# Patient Record
Sex: Male | Born: 1940 | Race: White | Hispanic: No | Marital: Married | Smoking: Former smoker
Health system: Southern US, Community
[De-identification: ages and names within clinical notes are randomized; demographics above are authoritative.]

## PROBLEM LIST (undated history)

## (undated) DIAGNOSIS — E785 Hyperlipidemia, unspecified: Secondary | ICD-10-CM

## (undated) DIAGNOSIS — T8859XA Other complications of anesthesia, initial encounter: Secondary | ICD-10-CM

## (undated) DIAGNOSIS — K264 Chronic or unspecified duodenal ulcer with hemorrhage: Secondary | ICD-10-CM

## (undated) DIAGNOSIS — D649 Anemia, unspecified: Secondary | ICD-10-CM

## (undated) DIAGNOSIS — K922 Gastrointestinal hemorrhage, unspecified: Secondary | ICD-10-CM

## (undated) DIAGNOSIS — T4145XA Adverse effect of unspecified anesthetic, initial encounter: Secondary | ICD-10-CM

## (undated) DIAGNOSIS — Z85828 Personal history of other malignant neoplasm of skin: Secondary | ICD-10-CM

## (undated) DIAGNOSIS — I10 Essential (primary) hypertension: Secondary | ICD-10-CM

## (undated) DIAGNOSIS — G4730 Sleep apnea, unspecified: Secondary | ICD-10-CM

## (undated) DIAGNOSIS — M869 Osteomyelitis, unspecified: Secondary | ICD-10-CM

## (undated) DIAGNOSIS — C189 Malignant neoplasm of colon, unspecified: Secondary | ICD-10-CM

## (undated) DIAGNOSIS — Z8619 Personal history of other infectious and parasitic diseases: Secondary | ICD-10-CM

## (undated) DIAGNOSIS — Z86018 Personal history of other benign neoplasm: Secondary | ICD-10-CM

## (undated) DIAGNOSIS — Z85038 Personal history of other malignant neoplasm of large intestine: Secondary | ICD-10-CM

## (undated) HISTORY — DX: Gastrointestinal hemorrhage, unspecified: K92.2

## (undated) HISTORY — DX: Essential (primary) hypertension: I10

## (undated) HISTORY — DX: Personal history of other malignant neoplasm of large intestine: Z85.038

## (undated) HISTORY — PX: HEMORRHOID SURGERY: SHX153

## (undated) HISTORY — DX: Personal history of other malignant neoplasm of skin: Z85.828

## (undated) HISTORY — DX: Malignant neoplasm of colon, unspecified: C18.9

## (undated) HISTORY — DX: Personal history of other benign neoplasm: Z86.018

## (undated) HISTORY — DX: Personal history of other infectious and parasitic diseases: Z86.19

## (undated) HISTORY — DX: Anemia, unspecified: D64.9

## (undated) HISTORY — DX: Sleep apnea, unspecified: G47.30

## (undated) HISTORY — DX: Hyperlipidemia, unspecified: E78.5

## (undated) HISTORY — DX: Chronic or unspecified duodenal ulcer with hemorrhage: K26.4

---
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
## 1990-05-02 HISTORY — PX: COLECTOMY: SHX59

## 1998-10-07 ENCOUNTER — Other Ambulatory Visit: Admission: RE | Admit: 1998-10-07 | Discharge: 1998-10-07 | Payer: Self-pay | Admitting: *Deleted

## 1999-07-22 ENCOUNTER — Ambulatory Visit: Admission: RE | Admit: 1999-07-22 | Discharge: 1999-07-22 | Payer: Self-pay | Admitting: Internal Medicine

## 1999-11-09 ENCOUNTER — Encounter (INDEPENDENT_AMBULATORY_CARE_PROVIDER_SITE_OTHER): Payer: Self-pay | Admitting: Specialist

## 1999-11-09 ENCOUNTER — Other Ambulatory Visit: Admission: RE | Admit: 1999-11-09 | Discharge: 1999-11-09 | Payer: Self-pay | Admitting: Gastroenterology

## 2004-06-11 ENCOUNTER — Ambulatory Visit: Payer: Self-pay | Admitting: Internal Medicine

## 2004-06-18 ENCOUNTER — Ambulatory Visit: Payer: Self-pay | Admitting: Internal Medicine

## 2004-06-30 ENCOUNTER — Ambulatory Visit: Payer: Self-pay | Admitting: Internal Medicine

## 2004-09-10 ENCOUNTER — Ambulatory Visit: Payer: Self-pay | Admitting: Internal Medicine

## 2004-10-04 ENCOUNTER — Ambulatory Visit: Payer: Self-pay | Admitting: Internal Medicine

## 2005-03-05 ENCOUNTER — Ambulatory Visit: Payer: Self-pay | Admitting: Internal Medicine

## 2005-04-11 ENCOUNTER — Ambulatory Visit: Payer: Self-pay | Admitting: Internal Medicine

## 2005-04-18 ENCOUNTER — Ambulatory Visit: Payer: Self-pay | Admitting: Internal Medicine

## 2005-06-20 ENCOUNTER — Ambulatory Visit: Payer: Self-pay | Admitting: Internal Medicine

## 2005-09-12 ENCOUNTER — Ambulatory Visit: Payer: Self-pay | Admitting: Internal Medicine

## 2005-09-19 ENCOUNTER — Ambulatory Visit: Payer: Self-pay | Admitting: Internal Medicine

## 2005-10-06 ENCOUNTER — Ambulatory Visit: Payer: Self-pay | Admitting: Gastroenterology

## 2005-10-20 ENCOUNTER — Ambulatory Visit: Payer: Self-pay | Admitting: Gastroenterology

## 2005-10-20 ENCOUNTER — Encounter (INDEPENDENT_AMBULATORY_CARE_PROVIDER_SITE_OTHER): Payer: Self-pay | Admitting: *Deleted

## 2005-11-21 ENCOUNTER — Ambulatory Visit: Payer: Self-pay | Admitting: Internal Medicine

## 2006-01-31 ENCOUNTER — Ambulatory Visit: Payer: Self-pay | Admitting: Internal Medicine

## 2006-05-04 ENCOUNTER — Ambulatory Visit (HOSPITAL_BASED_OUTPATIENT_CLINIC_OR_DEPARTMENT_OTHER): Admission: RE | Admit: 2006-05-04 | Discharge: 2006-05-04 | Payer: Self-pay | Admitting: Internal Medicine

## 2006-05-15 ENCOUNTER — Ambulatory Visit: Payer: Self-pay | Admitting: Internal Medicine

## 2006-05-15 LAB — CONVERTED CEMR LAB: Hgb A1c MFr Bld: 6.2 % — ABNORMAL HIGH (ref 4.6–6.0)

## 2006-05-16 ENCOUNTER — Ambulatory Visit: Payer: Self-pay | Admitting: Pulmonary Disease

## 2006-07-24 ENCOUNTER — Ambulatory Visit: Payer: Self-pay | Admitting: Internal Medicine

## 2006-07-24 LAB — CONVERTED CEMR LAB: Hgb A1c MFr Bld: 6.7 % — ABNORMAL HIGH (ref 4.6–6.0)

## 2006-10-11 ENCOUNTER — Ambulatory Visit: Payer: Self-pay | Admitting: Internal Medicine

## 2006-11-15 ENCOUNTER — Ambulatory Visit: Payer: Self-pay | Admitting: Internal Medicine

## 2007-03-07 ENCOUNTER — Ambulatory Visit: Payer: Self-pay | Admitting: Internal Medicine

## 2007-04-16 ENCOUNTER — Ambulatory Visit: Payer: Self-pay | Admitting: Internal Medicine

## 2007-04-16 LAB — CONVERTED CEMR LAB
Alkaline Phosphatase: 68 units/L (ref 39–117)
Basophils Relative: 0.2 % (ref 0.0–1.0)
Bilirubin Urine: NEGATIVE
CO2: 31 meq/L (ref 19–32)
Cholesterol: 156 mg/dL (ref 0–200)
Creatinine, Ser: 1 mg/dL (ref 0.4–1.5)
Creatinine,U: 145.8 mg/dL
Glucose, Bld: 163 mg/dL — ABNORMAL HIGH (ref 70–99)
HCT: 43.8 % (ref 39.0–52.0)
Hemoglobin: 15.1 g/dL (ref 13.0–17.0)
LDL Cholesterol: 95 mg/dL (ref 0–99)
Microalb, Ur: 0.9 mg/dL (ref 0.0–1.9)
Monocytes Absolute: 0.7 10*3/uL (ref 0.2–0.7)
Neutrophils Relative %: 68.6 % (ref 43.0–77.0)
Nitrite: NEGATIVE
Potassium: 5.2 meq/L — ABNORMAL HIGH (ref 3.5–5.1)
RDW: 13 % (ref 11.5–14.6)
Sodium: 139 meq/L (ref 135–145)
Specific Gravity, Urine: 1.025
TSH: 2.02 microintl units/mL (ref 0.35–5.50)
Total Bilirubin: 0.9 mg/dL (ref 0.3–1.2)
Total Protein: 6.6 g/dL (ref 6.0–8.3)
Urobilinogen, UA: 0.2
VLDL: 26 mg/dL (ref 0–40)

## 2007-04-23 ENCOUNTER — Ambulatory Visit: Payer: Self-pay | Admitting: Internal Medicine

## 2007-04-23 DIAGNOSIS — I10 Essential (primary) hypertension: Secondary | ICD-10-CM

## 2007-04-23 DIAGNOSIS — E1159 Type 2 diabetes mellitus with other circulatory complications: Secondary | ICD-10-CM

## 2007-04-23 DIAGNOSIS — E1169 Type 2 diabetes mellitus with other specified complication: Secondary | ICD-10-CM | POA: Insufficient documentation

## 2007-04-23 DIAGNOSIS — G4733 Obstructive sleep apnea (adult) (pediatric): Secondary | ICD-10-CM | POA: Insufficient documentation

## 2007-04-23 DIAGNOSIS — E119 Type 2 diabetes mellitus without complications: Secondary | ICD-10-CM | POA: Insufficient documentation

## 2007-04-23 DIAGNOSIS — I152 Hypertension secondary to endocrine disorders: Secondary | ICD-10-CM

## 2007-04-23 DIAGNOSIS — E785 Hyperlipidemia, unspecified: Secondary | ICD-10-CM

## 2007-04-23 HISTORY — DX: Essential (primary) hypertension: I10

## 2007-04-23 HISTORY — DX: Obstructive sleep apnea (adult) (pediatric): G47.33

## 2007-04-23 HISTORY — DX: Type 2 diabetes mellitus without complications: E11.9

## 2007-04-23 HISTORY — DX: Type 2 diabetes mellitus with other specified complication: E11.69

## 2007-04-23 HISTORY — DX: Hypertension secondary to endocrine disorders: I15.2

## 2007-04-23 HISTORY — DX: Type 2 diabetes mellitus with other circulatory complications: E11.59

## 2007-07-19 ENCOUNTER — Ambulatory Visit: Payer: Self-pay | Admitting: Internal Medicine

## 2007-10-22 ENCOUNTER — Ambulatory Visit: Payer: Self-pay | Admitting: Internal Medicine

## 2007-10-22 LAB — CONVERTED CEMR LAB: Microalb, Ur: 0.2 mg/dL (ref 0.0–1.9)

## 2008-01-31 ENCOUNTER — Ambulatory Visit: Payer: Self-pay | Admitting: Internal Medicine

## 2008-04-14 ENCOUNTER — Ambulatory Visit: Payer: Self-pay | Admitting: Internal Medicine

## 2008-04-14 LAB — CONVERTED CEMR LAB
ALT: 35 units/L (ref 0–53)
AST: 21 units/L (ref 0–37)
BUN: 10 mg/dL (ref 6–23)
Basophils Relative: 0.2 % (ref 0.0–3.0)
Bilirubin Urine: NEGATIVE
CO2: 31 meq/L (ref 19–32)
Calcium: 9.5 mg/dL (ref 8.4–10.5)
Cholesterol: 149 mg/dL (ref 0–200)
Creatinine, Ser: 0.9 mg/dL (ref 0.4–1.5)
Creatinine,U: 220 mg/dL
Eosinophils Relative: 5.4 % — ABNORMAL HIGH (ref 0.0–5.0)
Glucose, Bld: 143 mg/dL — ABNORMAL HIGH (ref 70–99)
Hemoglobin: 15.3 g/dL (ref 13.0–17.0)
LDL Cholesterol: 88 mg/dL (ref 0–99)
Lymphocytes Relative: 18.2 % (ref 12.0–46.0)
MCHC: 34.8 g/dL (ref 30.0–36.0)
Microalb, Ur: 1 mg/dL (ref 0.0–1.9)
Monocytes Relative: 8.2 % (ref 3.0–12.0)
Neutro Abs: 4.6 10*3/uL (ref 1.4–7.7)
RBC: 4.6 M/uL (ref 4.22–5.81)
TSH: 1.62 microintl units/mL (ref 0.35–5.50)
Total CHOL/HDL Ratio: 3.9
Total Protein: 6.7 g/dL (ref 6.0–8.3)
Urobilinogen, UA: 0.2
WBC: 6.7 10*3/uL (ref 4.5–10.5)

## 2008-04-21 ENCOUNTER — Ambulatory Visit: Payer: Self-pay | Admitting: Internal Medicine

## 2008-04-21 DIAGNOSIS — I498 Other specified cardiac arrhythmias: Secondary | ICD-10-CM | POA: Insufficient documentation

## 2008-05-08 ENCOUNTER — Ambulatory Visit: Payer: Self-pay | Admitting: Internal Medicine

## 2008-05-08 ENCOUNTER — Telehealth: Payer: Self-pay | Admitting: *Deleted

## 2008-05-08 DIAGNOSIS — R519 Headache, unspecified: Secondary | ICD-10-CM | POA: Insufficient documentation

## 2008-05-08 DIAGNOSIS — B0233 Zoster keratitis: Secondary | ICD-10-CM

## 2008-05-08 DIAGNOSIS — B029 Zoster without complications: Secondary | ICD-10-CM | POA: Insufficient documentation

## 2008-05-08 DIAGNOSIS — R51 Headache: Secondary | ICD-10-CM | POA: Insufficient documentation

## 2008-05-08 HISTORY — DX: Zoster keratitis: B02.33

## 2008-05-22 ENCOUNTER — Ambulatory Visit: Payer: Self-pay | Admitting: Internal Medicine

## 2008-05-22 DIAGNOSIS — B0229 Other postherpetic nervous system involvement: Secondary | ICD-10-CM | POA: Insufficient documentation

## 2008-06-02 ENCOUNTER — Ambulatory Visit: Payer: Self-pay | Admitting: Internal Medicine

## 2008-07-09 ENCOUNTER — Ambulatory Visit: Payer: Self-pay | Admitting: Internal Medicine

## 2008-07-09 LAB — CONVERTED CEMR LAB: CRP, High Sensitivity: 1 (ref 0.00–5.00)

## 2008-07-23 ENCOUNTER — Ambulatory Visit: Payer: Self-pay | Admitting: Internal Medicine

## 2008-08-15 ENCOUNTER — Encounter (INDEPENDENT_AMBULATORY_CARE_PROVIDER_SITE_OTHER): Payer: Self-pay | Admitting: *Deleted

## 2008-09-15 ENCOUNTER — Ambulatory Visit: Payer: Self-pay | Admitting: Gastroenterology

## 2008-10-07 ENCOUNTER — Encounter: Payer: Self-pay | Admitting: Gastroenterology

## 2008-10-07 ENCOUNTER — Ambulatory Visit: Payer: Self-pay | Admitting: Gastroenterology

## 2008-10-07 LAB — HM COLONOSCOPY

## 2008-10-08 ENCOUNTER — Encounter: Payer: Self-pay | Admitting: Gastroenterology

## 2008-10-15 ENCOUNTER — Ambulatory Visit: Payer: Self-pay | Admitting: Internal Medicine

## 2008-10-15 LAB — CONVERTED CEMR LAB: Hgb A1c MFr Bld: 7.1 % — ABNORMAL HIGH (ref 4.6–6.5)

## 2008-10-22 ENCOUNTER — Ambulatory Visit: Payer: Self-pay | Admitting: Internal Medicine

## 2008-10-22 LAB — CONVERTED CEMR LAB
Cholesterol, target level: 200 mg/dL
HDL goal, serum: 40 mg/dL

## 2008-12-17 ENCOUNTER — Ambulatory Visit: Payer: Self-pay | Admitting: Internal Medicine

## 2008-12-17 DIAGNOSIS — H209 Unspecified iridocyclitis: Secondary | ICD-10-CM | POA: Insufficient documentation

## 2008-12-17 HISTORY — DX: Unspecified iridocyclitis: H20.9

## 2009-01-29 ENCOUNTER — Ambulatory Visit: Payer: Self-pay | Admitting: Internal Medicine

## 2009-01-29 LAB — CONVERTED CEMR LAB
Calcium: 9.3 mg/dL (ref 8.4–10.5)
Creatinine, Ser: 0.9 mg/dL (ref 0.4–1.5)
Creatinine,U: 165 mg/dL
GFR calc non Af Amer: 89.14 mL/min (ref >60)
Glucose, Bld: 157 mg/dL — ABNORMAL HIGH (ref 70–99)
Sodium: 140 meq/L (ref 135–145)

## 2009-02-17 ENCOUNTER — Ambulatory Visit: Payer: Self-pay | Admitting: Internal Medicine

## 2009-02-19 ENCOUNTER — Encounter: Payer: Self-pay | Admitting: Internal Medicine

## 2009-04-21 ENCOUNTER — Ambulatory Visit: Payer: Self-pay | Admitting: Internal Medicine

## 2009-07-22 ENCOUNTER — Ambulatory Visit: Payer: Self-pay | Admitting: Internal Medicine

## 2009-11-06 ENCOUNTER — Ambulatory Visit: Payer: Self-pay | Admitting: Internal Medicine

## 2009-11-06 LAB — CONVERTED CEMR LAB
ALT: 52 units/L (ref 0–53)
Alkaline Phosphatase: 68 units/L (ref 39–117)
BUN: 20 mg/dL (ref 6–23)
Basophils Relative: 0.5 % (ref 0.0–3.0)
Bilirubin Urine: NEGATIVE
Bilirubin, Direct: 0.1 mg/dL (ref 0.0–0.3)
Calcium: 9.3 mg/dL (ref 8.4–10.5)
Chloride: 106 meq/L (ref 96–112)
Cholesterol: 162 mg/dL (ref 0–200)
Creatinine, Ser: 1 mg/dL (ref 0.4–1.5)
Creatinine,U: 146.6 mg/dL
Eosinophils Relative: 5 % (ref 0.0–5.0)
GFR calc non Af Amer: 81.57 mL/min (ref >60)
HDL: 41.4 mg/dL (ref >39.00)
Hgb A1c MFr Bld: 8.5 % — ABNORMAL HIGH (ref 4.6–6.5)
Ketones, urine, test strip: NEGATIVE
LDL Cholesterol: 84 mg/dL (ref 0–99)
Lymphocytes Relative: 17.4 % (ref 12.0–46.0)
MCV: 94.7 fL (ref 78.0–100.0)
Monocytes Absolute: 0.5 10*3/uL (ref 0.1–1.0)
Monocytes Relative: 7.2 % (ref 3.0–12.0)
Neutrophils Relative %: 69.9 % (ref 43.0–77.0)
Nitrite: NEGATIVE
Platelets: 197 10*3/uL (ref 150.0–400.0)
RBC: 4.68 M/uL (ref 4.22–5.81)
Total Bilirubin: 0.7 mg/dL (ref 0.3–1.2)
Total CHOL/HDL Ratio: 4
Total Protein: 7.4 g/dL (ref 6.0–8.3)
Triglycerides: 182 mg/dL — ABNORMAL HIGH (ref 0.0–149.0)
Urobilinogen, UA: 0.2
VLDL: 36.4 mg/dL (ref 0.0–40.0)
WBC: 7 10*3/uL (ref 4.5–10.5)

## 2009-11-19 ENCOUNTER — Ambulatory Visit: Payer: Self-pay | Admitting: Internal Medicine

## 2009-11-26 ENCOUNTER — Telehealth: Payer: Self-pay | Admitting: Internal Medicine

## 2010-01-27 ENCOUNTER — Ambulatory Visit: Payer: Self-pay | Admitting: Internal Medicine

## 2010-01-27 LAB — CONVERTED CEMR LAB
ALT: 37 units/L (ref 0–53)
AST: 29 units/L (ref 0–37)
Total Bilirubin: 0.6 mg/dL (ref 0.3–1.2)

## 2010-02-08 ENCOUNTER — Ambulatory Visit: Payer: Self-pay | Admitting: Internal Medicine

## 2010-02-08 LAB — HM DIABETES FOOT EXAM

## 2010-02-10 LAB — HM DIABETES EYE EXAM: HM Diabetic Eye Exam: NORMAL

## 2010-06-01 ENCOUNTER — Ambulatory Visit
Admission: RE | Admit: 2010-06-01 | Discharge: 2010-06-01 | Payer: Self-pay | Source: Home / Self Care | Attending: Internal Medicine | Admitting: Internal Medicine

## 2010-06-01 ENCOUNTER — Other Ambulatory Visit: Payer: Self-pay | Admitting: Internal Medicine

## 2010-06-01 LAB — BASIC METABOLIC PANEL
BUN: 18 mg/dL (ref 6–23)
Creatinine, Ser: 1 mg/dL (ref 0.4–1.5)
GFR: 76.85 mL/min (ref >60.00)

## 2010-06-01 LAB — HEMOGLOBIN A1C: Hgb A1c MFr Bld: 6.5 % (ref 4.6–6.5)

## 2010-06-01 NOTE — Assessment & Plan Note (Signed)
Summary: 3 MONTH ROV/NJR   Vital Signs:  Patient profile:   70 year old male Height:      71 inches Weight:      207 pounds BMI:     28.97 Temp:     98.2 degrees F oral Pulse rate:   72 / minute Resp:     14 per minute BP sitting:   136 / 80  (left arm)  Vitals Entered By: Willy Eddy, LPN (February 08, 2010 10:11 AM) CC: roa labs, Type 2 diabetes mellitus follow-up Is Patient Diabetic? Yes Did you bring your meter with you today? No   Primary Care Provider:  Stacie Glaze MD  CC:  roa labs and Type 2 diabetes mellitus follow-up.  History of Present Illness: Has lost weight follow up DM needs new meter   Type 2 Diabetes Mellitus Follow-Up      This is a 70 year old man who presents for Type 2 diabetes mellitus follow-up.  The patient denies polyuria, polydipsia, blurred vision, self managed hypoglycemia, hypoglycemia requiring help, weight loss, weight gain, and numbness of extremities.  The patient denies the following symptoms: neuropathic pain, chest pain, vomiting, orthostatic symptoms, poor wound healing, intermittent claudication, vision loss, and foot ulcer.  Since the last visit the patient reports good dietary compliance.  The patient has been measuring capillary blood glucose before breakfast and before dinner.  Since the last visit, the patient reports having had eye care by an ophthalmologist.    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) 4)  Facial Pain  (ICD-784.0) 5)  Shingles  (ICD-053.9) 6)  Bigeminy  (ICD-427.89) 7)  Sleep Apnea, Obstructive, Moderate  (ICD-327.23) 8)  Family History of Colon Ca 1st Degree Relative <60  (ICD-V16.0) 9)  Hypertension  (ICD-401.9) 10)  Hyperlipidemia  (ICD-272.4) 11)  Diabetes  Mellitus, Type II  (ICD-250.00) 12)  Routine General Medical Exam@health  Care Facl  (ICD-V70.0)  Current Problems (verified): 1)  Iritis  (ICD-364.3) 2)  Postherpetic Neuralgia  (ICD-053.19) 3)  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 1 Three Times A Day 2)  Zetia 10 Mg  Tabs (Ezetimibe) .... Take 1 Tablet By Mouth Once A Day 3)  Azor 5-40 Mg Tabs (Amlodipine-Olmesartan) .... One By Mouth Daily 4)  Janumet 50-1000 Mg Tabs (Sitagliptin-Metformin Hcl) .... One By Mouth Two Times A Day  Current Medications (verified): 1)  Zetia 10 Mg  Tabs (Ezetimibe) .... Take 1 Tablet By Mouth Once A Day 2)  Azor 5-40 Mg Tabs (Amlodipine-Olmesartan) .... One By Mouth Daily 3)  Janumet 50-1000 Mg Tabs (Sitagliptin-Metformin Hcl) .... One By Mouth Two Times A Day 4)  Onetouch Ultra Blue  Strp (Glucose Blood) .... Once Daily  Allergies (verified): No Known Drug Allergies  Past History:  Family  History: Last updated: 04/23/2007 Family History of Colon CA 1st degree relative <60  Social History: Last updated: 04/23/2007 Married Alcohol use-yes Drug use-no Regular exercise-yes  Risk Factors: Exercise: yes (04/23/2007)  Risk Factors: Smoking Status: quit (02/08/2010) Passive Smoke Exposure: no (02/08/2010)  Past medical, surgical, family and social histories (including risk factors) reviewed, and no changes noted (except as noted below).  Past Medical History: Reviewed history from 04/23/2007 and no changes required. Diabetes  mellitus, type II Hyperlipidemia Hypertension  Past Surgical History: Reviewed history from 04/23/2007 and no changes required. Colectomy Hemorrhoidectomy  Family History: Reviewed history from 04/23/2007 and no changes required. Family History of Colon CA 1st degree relative <60  Social History: Reviewed history from 04/23/2007 and no changes required. Married Alcohol use-yes Drug use-no Regular exercise-yes  Review of Systems  The patient denies anorexia, fever, weight loss, weight gain, vision loss, decreased hearing, hoarseness, chest pain, syncope, dyspnea on exertion, peripheral edema, prolonged cough, headaches, hemoptysis, abdominal pain, melena, hematochezia, severe indigestion/heartburn, hematuria, incontinence, genital sores, muscle weakness, suspicious skin lesions, transient blindness, difficulty walking, depression, unusual weight change, abnormal bleeding, enlarged lymph nodes, angioedema, and breast masses.    Physical Exam  General:  Well-developed,well-nourished,in no acute distress; alert,appropriate and cooperative throughout examination Head:  normocephalic and atraumatic.   Eyes:  pupils equal and pupils round.   Ears:  R ear normal and L ear normal.   Nose:  no external deformity and no nasal discharge.   Mouth:  good dentition and pharynx pink and moist.   Neck:  supple and no masses.   Lungs:  normal respiratory effort and no wheezes.   Heart:  normal rate and regular rhythm.   Abdomen:  Bowel sounds positive,abdomen soft and non-tender without masses, organomegaly or hernias noted. Prostate:  no asymmetry.   Msk:  No deformity or scoliosis noted of thoracic or lumbar spine.   Pulses:  R and L carotid,radial,femoral,dorsalis pedis and posterior tibial pulses are full and equal bilaterally Extremities:  No clubbing, cyanosis, edema, or deformity noted with normal full range of motion of all joints.    Diabetes Management Exam:    Foot Exam (with socks  and/or shoes not present):       Sensory-Pinprick/Light touch:          Left medial foot (L-4): normal          Left dorsal foot (L-5): normal          Left lateral foot (S-1): normal          Right medial foot (L-4): normal          Right dorsal foot (L-5): normal          Right lateral foot (S-1): normal       Sensory-Monofilament:          Left foot: normal          Right foot: normal       Inspection:          Left foot: normal          Right foot: normal       Nails:          Left foot: normal          Right foot: normal    Eye Exam:       Eye Exam done elsewhere          Date: 02/10/2010          Results: normal  Done by: Tanner   Impression & Recommendations:  Problem # 1:  HYPERTENSION (ICD-401.9)  home reading ARE 110/70 His updated medication list for this problem includes:    Azor 5-40 Mg Tabs (Amlodipine-olmesartan) ..... One by mouth daily  BP today: 136/80 Prior BP: 136/80 (11/19/2009)  Prior 10 Yr Risk Heart Disease: 18 % (04/21/2009) IF THE DIZZYNESS WORSENS CONSIDER CHANGE TO BENICAR ONLY  Labs Reviewed: K+: 4.8 (11/06/2009) Creat: : 1.0 (11/06/2009)   Chol: 162 (11/06/2009)   HDL: 41.40 (11/06/2009)   LDL: 84 (11/06/2009)   TG: 182.0 (11/06/2009)  BP today: 136/80 Prior BP: 136/80 (11/19/2009)  Prior 10 Yr Risk Heart Disease: 18 % (04/21/2009)  Labs Reviewed: K+: 4.8 (11/06/2009) Creat: : 1.0 (11/06/2009)   Chol: 162 (11/06/2009)   HDL: 41.40 (11/06/2009)   LDL: 84 (11/06/2009)   TG: 182.0 (11/06/2009)  Problem # 2:  HYPERLIPIDEMIA (ICD-272.4)  His updated medication list for this problem includes:    Zetia 10 Mg Tabs (Ezetimibe) .Marland Kitchen... Take 1 tablet by mouth once a day  Labs Reviewed: SGOT: 29 (01/27/2010)   SGPT: 37 (01/27/2010)  Lipid Goals: Chol Goal: 200 (10/22/2008)   HDL Goal: 40 (10/22/2008)   LDL Goal: 100 (10/22/2008)   TG Goal: 150 (10/22/2008)  Prior 10 Yr Risk Heart Disease: 18 % (04/21/2009)   HDL:41.40  (11/06/2009), 38.2 (04/14/2008)  LDL:84 (11/06/2009), 88 (04/14/2008)  Chol:162 (11/06/2009), 149 (04/14/2008)  Trig:182.0 (11/06/2009), 114 (04/14/2008)  Problem # 3:  DIABETES MELLITUS, TYPE II (ICD-250.00) Assessment: Improved  The following medications were removed from the medication list:    Metformin Hcl 500 Mg Tb24 (Metformin hcl) .Marland Kitchen... 1 three times a day His updated medication list for this problem includes:    Azor 5-40 Mg Tabs (Amlodipine-olmesartan) ..... One by mouth daily    Janumet 50-1000 Mg Tabs (Sitagliptin-metformin hcl) ..... One by mouth two times a day  Labs Reviewed: Creat: 1.0 (11/06/2009)     Last Eye Exam: normal (02/10/2010) Reviewed HgBA1c results: 6.5 (01/27/2010)  8.5 (11/06/2009)  Complete Medication List: 1)  Zetia 10 Mg Tabs (Ezetimibe) .... Take 1 tablet by mouth once a day 2)  Azor 5-40 Mg Tabs (Amlodipine-olmesartan) .... One by mouth daily 3)  Janumet 50-1000 Mg Tabs (Sitagliptin-metformin hcl) .... One by mouth two times a day 4)  Onetouch Ultra Blue Strp (Glucose blood) .... Once daily  Patient Instructions: 1)  intructions about randon glucoses 2)  goals are 90-110 for AM fasting 3)  before meals   90-140 range 4)  Please schedule a follow-up appointment in 3 months FOR cpx Prescriptions: ONETOUCH ULTRA BLUE  STRP (GLUCOSE BLOOD) once daily  #100 x 3   Entered by:   Willy Eddy, LPN   Authorized by:   Stacie Glaze MD   Signed by:   Willy Eddy, LPN on 16/01/9603   Method used:   Print then Give to Patient   RxID:   [id number]

## 2010-06-01 NOTE — Assessment & Plan Note (Signed)
Summary: cpx//ccm   Vital Signs:  Patient profile:   70 year old male Height:      71 inches Weight:      222 pounds BMI:     31.07 Temp:     98.2 degrees F oral Pulse rate:   72 / minute Resp:     14 per minute BP sitting:   136 / 80  (left arm)  Vitals Entered By: Willy Eddy, LPN (November 19, 2009 10:36 AM) CC: annual visit for disese managment, Hypertension Management, Lipid Management, Type 2 diabetes mellitus follow-up Is Patient Diabetic? Yes Did you bring your meter with you today? No   Primary Care Provider:  Stacie Glaze MD  CC:  annual visit for disese managment, Hypertension Management, Lipid Management, and Type 2 diabetes mellitus follow-up.  History of Present Illness: The pt was asked about all immunizations, health maint. services that are appropriate to their age and was given guidance on diet exercize  and weight management   wweight gain and diet non compliance    Type 2 Diabetes Mellitus Follow-Up      This is a 70 year old man who presents for Type 2 diabetes mellitus follow-up.  The patient denies polyuria, polydipsia, blurred vision, self managed hypoglycemia, hypoglycemia requiring help, weight loss, weight gain, and numbness of extremities.  The patient denies the following symptoms: neuropathic pain, chest pain, vomiting, orthostatic symptoms, poor wound healing, intermittent claudication, vision loss, and foot ulcer.    Hypertension History:      He denies headache, chest pain, palpitations, dyspnea with exertion, orthopnea, PND, peripheral edema, visual symptoms, neurologic problems, syncope, and side effects from treatment.        Positive major cardiovascular risk factors include male age 70 years old or older, diabetes, hyperlipidemia, and hypertension.  Negative major cardiovascular risk factors include negative family history for ischemic heart disease and non-tobacco-user status.        Further assessment for target organ damage reveals no  history of ASHD, stroke/TIA, or peripheral vascular disease.    Lipid Management History:      Positive NCEP/ATP III risk factors include male age 70 years old or older, diabetes, and hypertension.  Negative NCEP/ATP III risk factors include no family history for ischemic heart disease, non-tobacco-user status, no ASHD (atherosclerotic heart disease), no prior stroke/TIA, no peripheral vascular disease, and no history of aortic aneurysm.      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) 4)  Facial Pain  (ICD-784.0) 5)  Shingles  (ICD-053.9) 6)  Bigeminy  (ICD-427.89) 7)  Sleep Apnea, Obstructive, Moderate  (ICD-327.23) 8)  Family History of Colon Ca 1st Degree Relative <60  (ICD-V16.0) 9)  Hypertension  (ICD-401.9) 10)  Hyperlipidemia  (ICD-272.4) 11)  Diabetes Mellitus, Type II  (ICD-250.00) 12)  Routine General Medical Exam@health  Care Facl  (ICD-V70.0)  Current Problems (verified): 1)  Iritis  (ICD-364.3) 2)  Postherpetic Neuralgia  (ICD-053.19) 3)  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 1 Three Times A Day 2)  Zetia 10 Mg  Tabs (Ezetimibe) .... Take 1 Tablet By Mouth Once A Day 3)  Azor 5-40 Mg Tabs (Amlodipine-Olmesartan) .... One By Mouth Daily  Current Medications  (verified): 1)  Metformin Hcl 500 Mg Tb24 (Metformin Hcl) .Marland Kitchen.. 1 Three Times A Day 2)  Zetia 10 Mg  Tabs (Ezetimibe) .... Take 1 Tablet By Mouth Once A Day 3)  Azor 5-40 Mg Tabs (Amlodipine-Olmesartan) .... One By Mouth Daily 4)  Janumet 50-1000 Mg Tabs (Sitagliptin-Metformin Hcl) .... One By Mouth Two Times A Day  Allergies (verified): No Known Drug Allergies  Past History:  Family History: Last updated: 04/23/2007 Family History of Colon CA 1st degree relative <60  Social History: Last updated: 04/23/2007 Married Alcohol use-yes Drug use-no Regular exercise-yes  Risk Factors: Exercise: yes (04/23/2007)  Risk Factors: Smoking Status: quit (11/19/2009) Passive Smoke Exposure: no (11/19/2009)  Past medical, surgical, family and social histories (including risk factors) reviewed, and no changes noted (except as noted below).  Past Medical History: Reviewed history from 04/23/2007 and no changes required. Diabetes mellitus, type II Hyperlipidemia Hypertension  Past Surgical History: Reviewed history from 04/23/2007 and no changes required. Colectomy Hemorrhoidectomy  Family History: Reviewed history from 04/23/2007 and no changes required. Family History of Colon CA 1st degree relative <60  Social History: Reviewed history from 04/23/2007 and no changes required. Married Alcohol use-yes Drug use-no Regular exercise-yes  Review of Systems  The patient denies anorexia, fever, weight loss, weight gain, vision loss, decreased hearing, hoarseness, chest pain, syncope, dyspnea on exertion, peripheral edema, prolonged cough, headaches, hemoptysis, abdominal pain, melena, hematochezia, severe indigestion/heartburn, hematuria, incontinence, genital sores, muscle weakness, suspicious skin lesions, transient blindness, difficulty walking, depression, unusual weight change, abnormal bleeding, enlarged lymph nodes, angioedema, and breast masses.    Physical Exam  General:   Well-developed,well-nourished,in no acute distress; alert,appropriate and cooperative throughout examination Head:  normocephalic and atraumatic.   Eyes:  pupils equal and pupils round.   Ears:  R ear normal and L ear normal.   Nose:  no external deformity and no nasal discharge.   Mouth:  good dentition and pharynx pink and moist.   Neck:  supple and no masses.   Lungs:  normal respiratory effort and no wheezes.   Heart:  normal rate and regular rhythm.   Abdomen:  Bowel sounds positive,abdomen soft and non-tender without masses, organomegaly or hernias noted. Extremities:  No clubbing, cyanosis, edema, or deformity noted with normal full range of motion of all joints.   Neurologic:  alert & oriented X3 and cranial nerves II-XII intact.     Impression & Recommendations:  Problem # 1:  HYPERTENSION (ICD-401.9)  His updated medication list for this problem includes:    Azor 5-40 Mg Tabs (Amlodipine-olmesartan) ..... One by mouth daily  Orders: EKG w/ Interpretation (93000)  BP today: 136/80 Prior BP: 136/82 (07/22/2009)  Prior 10 Yr Risk Heart Disease: 18 % (04/21/2009)  Labs Reviewed: K+: 4.8 (11/06/2009) Creat: : 1.0 (11/06/2009)   Chol: 162 (11/06/2009)   HDL: 41.40 (11/06/2009)   LDL: 84 (11/06/2009)   TG: 182.0 (11/06/2009)  Problem # 2:  ROUTINE GENERAL MEDICAL EXAM@HEALTH  CARE FACL (ICD-V70.0) The pt was asked about all immunizations, health maint. services that are appropriate to their  age and was given guidance on diet exercize  and weight management  Colonoscopy: Location:  [facility].   (10/07/2008) Td Booster: Historical (04/04/2007)   Flu Vax: Historical (01/30/2009)   Pneumovax: Historical (04/04/2007) Chol: 162 (11/06/2009)   HDL: 41.40 (11/06/2009)   LDL: 84 (11/06/2009)   TG: 182.0 (11/06/2009) TSH: 1.41 (11/06/2009)   HgbA1C: 8.5 (11/06/2009)   PSA: 1.80 (11/06/2009) Next Colonoscopy due:: 10/2011 (10/07/2008)  Discussed using sunscreen, use  of alcohol, drug use, self testicular exam, routine dental care, routine eye care, routine physical exam, seat belts, multiple vitamins, osteoporosis prevention, adequate calcium intake in diet, and recommendations for immunizations.  Discussed exercise and checking cholesterol.  Discussed gun safety, safe sex, and contraception. Also recommend checking PSA.  Problem # 3:  DIABETES MELLITUS, TYPE II (ICD-250.00)  very poor control need medicatns change and major diet revisions His updated medication list for this problem includes:    Metformin Hcl 500 Mg Tb24 (Metformin hcl) .Marland Kitchen... 1 three times a day    Azor 5-40 Mg Tabs (Amlodipine-olmesartan) ..... One by mouth daily    Janumet 50-1000 Mg Tabs (Sitagliptin-metformin hcl) ..... One by mouth two times a day  Labs Reviewed: Creat: 1.0 (11/06/2009)     Last Eye Exam: normal (12/31/2008) Reviewed HgBA1c results: 8.5 (11/06/2009)  7.2 (01/29/2009)  Complete Medication List: 1)  Metformin Hcl 500 Mg Tb24 (Metformin hcl) .Marland Kitchen.. 1 three times a day 2)  Zetia 10 Mg Tabs (Ezetimibe) .... Take 1 tablet by mouth once a day 3)  Azor 5-40 Mg Tabs (Amlodipine-olmesartan) .... One by mouth daily 4)  Janumet 50-1000 Mg Tabs (Sitagliptin-metformin hcl) .... One by mouth two times a day  Hypertension Assessment/Plan:      The patient's hypertensive risk group is category C: Target organ damage and/or diabetes.  His calculated 10 year risk of coronary heart disease is 18 %.  Today's blood pressure is 136/80.  His blood pressure goal is < 130/80.  Lipid Assessment/Plan:      Based on NCEP/ATP III, the patient's risk factor category is "history of diabetes".  The patient's lipid goals are as follows: Total cholesterol goal is 200; LDL cholesterol goal is 100; HDL cholesterol goal is 40; Triglyceride goal is 150.    Patient Instructions: 1)  Please schedule a follow-up appointment in 3 months. 2)  HbgA1C prior to visit, ICD-9:250.00 3)  Hepatic Panel prior  to visit, ICD-9:995.20 Prescriptions: JANUMET 50-1000 MG TABS (SITAGLIPTIN-METFORMIN HCL) one by mouth two times a day  #30 x 1   Entered and Authorized by:   Stacie Glaze MD   Signed by:   Stacie Glaze MD on 11/19/2009   Method used:   Electronically to        [facility]  [phone]* (retail)       [address] [place], [place]  [address]       Ph: [phone] or [phone]       Fax: [phone]   RxID:   [id number]

## 2010-06-01 NOTE — Progress Notes (Signed)
Summary: med questions  Phone Note Call from Patient Call back at Work Phone    Caller: Patient Call For: Stacie Glaze MD Reason for Call: Talk to Doctor Summary of Call: Wife is calling stating he is taking Janumet 1000 mg. one by mouth two times a day glucomannan........Marland Kitchenrequesting to take this with the Janumet??? [phone] Initial call taken by: Lynann Beaver CMA,  November 26, 2009 11:06 AM  Follow-up for Phone Call        per dr Lovell Sheehan- not familiar with med- dont take until pt has ov and can bring in label to analyze it. Follow-up by: Willy Eddy, LPN,  November 26, 2009 11:50 AM

## 2010-06-01 NOTE — Assessment & Plan Note (Signed)
Summary: 3 mo rov/mm   Vital Signs:  Patient profile:   70 year old male Height:      71 inches Weight:      223 pounds BMI:     31.21 Temp:     98.2 degrees F oral Pulse rate:   72 / minute Resp:     14 per minute BP sitting:   136 / 82  (left arm)  Vitals Entered By: Willy Eddy, LPN (July 22, 2009 10:19 AM) CC: roa, Hypertension Management   CC:  roa and Hypertension Management.  History of Present Illness: follow up HTN and monitering HA and DM mild anxiety with OV's   Hypertension History:      He denies headache, chest pain, palpitations, dyspnea with exertion, orthopnea, PND, peripheral edema, visual symptoms, neurologic problems, syncope, and side effects from treatment.        Positive major cardiovascular risk factors include male age 54 years old or older, diabetes, hyperlipidemia, and hypertension.  Negative major cardiovascular risk factors include negative family history for ischemic heart disease and non-tobacco-user status.     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) 4)  Facial Pain  (ICD-784.0) 5)  Shingles  (ICD-053.9) 6)  Bigeminy  (ICD-427.89) 7)  Sleep Apnea, Obstructive, Moderate  (ICD-327.23) 8)  Family History of Colon Ca 1st Degree Relative <60  (ICD-V16.0) 9)  Hypertension  (ICD-401.9) 10)  Hyperlipidemia  (ICD-272.4) 11)  Diabetes Mellitus, Type II  (ICD-250.00) 12)  Routine General Medical Exam@[facility]  (ICD-V70.0)  Current Problems (verified): 1)  Iritis  (ICD-364.3) 2)  Postherpetic Neuralgia  (ICD-053.19) 3)  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 1 Three Times A Day 2)  Zetia 10 Mg  Tabs (Ezetimibe) .... Take 1 Tablet By Mouth Once A Day 3)  Lotemax 0.5 % Susp (Loteprednol Etabonate) .Marland Kitchen.. 1 Gtt  Every Day 4)  Istalol 0.5 % Soln (Timolol Maleate) .Marland Kitchen.. 1 Gtt Once Daily 5)  Azor 5-40 Mg Tabs (Amlodipine-Olmesartan) .... One By Mouth Daily  Current Medications (verified): 1)  Metformin Hcl 500 Mg Tb24 (Metformin Hcl) .Marland Kitchen.. 1 Three Times A Day 2)  Zetia 10 Mg  Tabs (Ezetimibe) .... Take 1 Tablet By Mouth Once A Day 3)  Azor 5-40 Mg Tabs (Amlodipine-Olmesartan) .... One By Mouth Daily  Allergies (verified): No Known Drug Allergies  Past History:  Family History: Last updated: 04/23/2007 Family History of Colon CA 1st degree relative <60  Social History: Last updated: 04/23/2007 Married Alcohol use-yes Drug use-no Regular exercise-yes  Risk Factors: Exercise: yes (04/23/2007)  Risk Factors: Smoking Status: quit (07/22/2009) Passive Smoke Exposure: no (07/22/2009)  Past medical, surgical, family and social histories (including risk factors) reviewed, and no changes noted (except as noted below).  Past Medical History:  Reviewed history from 04/23/2007 and no changes required. Diabetes mellitus, type II Hyperlipidemia Hypertension  Past Surgical History: Reviewed history from 04/23/2007 and no changes required. Colectomy Hemorrhoidectomy  Family History: Reviewed history from 04/23/2007 and no changes required. Family History of Colon CA 1st degree relative <60  Social History: Reviewed history from 04/23/2007 and no changes required. Married Alcohol use-yes Drug  use-no Regular exercise-yes  Review of Systems  The patient denies anorexia, fever, weight loss, weight gain, vision loss, decreased hearing, hoarseness, chest pain, syncope, dyspnea on exertion, peripheral edema, prolonged cough, headaches, hemoptysis, abdominal pain, melena, hematochezia, severe indigestion/heartburn, hematuria, incontinence, genital sores, muscle weakness, suspicious skin lesions, transient blindness, difficulty walking, depression, unusual weight change, abnormal bleeding, enlarged lymph nodes, angioedema, breast masses, and testicular masses.    Physical Exam  General:  Well-developed,well-nourished,in no acute distress; alert,appropriate and cooperative throughout examination Head:  normocephalic and atraumatic.   Eyes:  pupils equal and pupils round.   Ears:  R ear normal and L ear normal.   Nose:  no external deformity and no nasal discharge.   Mouth:  good dentition and pharynx pink and moist.   Neck:  supple and no masses.   Lungs:  normal respiratory effort and no wheezes.   Heart:  normal rate and regular rhythm.   Abdomen:  Bowel sounds positive,abdomen soft and non-tender without masses, organomegaly or hernias noted. Extremities:  No clubbing, cyanosis, edema, or deformity noted with normal full range of motion of all joints.   Neurologic:  alert & oriented X3 and cranial nerves II-XII intact.     Impression & Recommendations:  Problem # 1:  SLEEP APNEA, OBSTRUCTIVE, MODERATE (ICD-327.23) Assessment Unchanged  Problem # 2:  HYPERTENSION (ICD-401.9) Assessment: Unchanged  His updated medication list for this problem includes:    Azor 5-40 Mg Tabs (Amlodipine-olmesartan) ..... One by mouth daily  BP today: 146/86 Prior BP: 132/82 (04/21/2009)  Prior 10 Yr Risk Heart Disease: 18 % (04/21/2009)  Labs Reviewed: K+: 4.3 (01/29/2009) Creat: : 0.9 (01/29/2009)   Chol: 149 (04/14/2008)   HDL: 38.2 (04/14/2008)   LDL: 88 (04/14/2008)   TG: 114  (04/14/2008)  Problem # 3:  DIABETES MELLITUS, TYPE II (ICD-250.00)  His updated medication list for this problem includes:    Metformin Hcl 500 Mg Tb24 (Metformin hcl) .Marland Kitchen... 1 three times a day    Azor 5-40 Mg Tabs (Amlodipine-olmesartan) ..... One by mouth daily  Labs Reviewed: Creat: 0.9 (01/29/2009)     Last Eye Exam: normal (12/31/2008) Reviewed HgBA1c results: 7.2 (01/29/2009)  7.1 (10/15/2008)  Complete Medication List: 1)  Metformin Hcl 500 Mg Tb24 (Metformin hcl) .Marland Kitchen.. 1 three times a day 2)  Zetia 10 Mg Tabs (Ezetimibe) .... Take 1 tablet by mouth once a day 3)  Azor 5-40 Mg Tabs (Amlodipine-olmesartan) .... One by mouth daily  Hypertension Assessment/Plan:      The patient's hypertensive risk group is category C: Target organ damage and/or diabetes.  His calculated 10 year risk of coronary heart disease is 18 %.  Today's blood pressure is 136/82.  His blood pressure goal is < 130/80.  Patient Instructions: 1)  4 months for CPX with labs prior

## 2010-06-09 NOTE — Assessment & Plan Note (Signed)
Summary: 3 month rov/njr   Vital Signs:  Patient profile:   70 year old male Height:      71 inches Weight:      212 pounds BMI:     29.67 Temp:     98.2 degrees F oral Pulse rate:   72 / minute Resp:     14 per minute BP sitting:   132 / 80  (left arm)  Vitals Entered By: Willy Eddy, LPN (June 01, 2010 9:13 AM) CC: roa Is Patient Diabetic? Yes Did you bring your meter with you today? No   Primary Care Provider:  Stacie Glaze MD  CC:  roa.  History of Present Illness:  still remains on steroid therapy for the inflammation of his left eye.  the patient's CBGs are averaging at 112 he feels his blood sugars in good control at this time blood pressure is stable monitoring today will include an A1c and  bmet.  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 Take 1 Tablet By Mouth Once A Day 2)  Azor 5-40 Mg Tabs (Amlodipine-Olmesartan) .... One By Mouth Daily 3)  Janumet 50-1000 Mg Tabs (Sitagliptin-Metformin Hcl) .... One By Mouth Two Times A Day 4)  Onetouch Ultra Blue  Strp (Glucose Blood) .... Once Daily 5)  Alrex 0.2 % Susp (Loteprednol Etabonate) .... I Drop in Left Eye Once  Daily  Allergies (verified): No Known Drug Allergies  Past History:  Family History: Last updated: 04/23/2007 Family History of Colon CA 1st degree relative <60  Social History: Last updated: 04/23/2007 Married Alcohol use-yes Drug use-no Regular exercise-yes  Risk Factors: Exercise: yes (04/23/2007)  Risk Factors: Smoking Status: quit (06/01/2010) Passive Smoke Exposure: no (06/01/2010)  Past medical, surgical, family and social histories (including risk factors) reviewed, and no changes noted (except as noted below).  Past Medical History: Reviewed history from 04/23/2007 and no changes required. Diabetes mellitus, type II Hyperlipidemia Hypertension  Past Surgical History: Reviewed history from 04/23/2007 and no changes required. Colectomy Hemorrhoidectomy  Family History: Reviewed history from 04/23/2007 and no changes required. Family History of Colon CA 1st degree relative <60  Social History: Reviewed history from 04/23/2007 and no changes required. Married Alcohol use-yes Drug use-no Regular exercise-yes  Review of Systems  The patient denies anorexia, fever, weight loss, weight gain, vision loss, decreased hearing, hoarseness, chest pain, syncope, dyspnea on exertion, peripheral edema, prolonged cough, headaches, hemoptysis, abdominal pain, melena, hematochezia, severe indigestion/heartburn, hematuria, incontinence, genital sores, muscle weakness, suspicious skin lesions, transient blindness, difficulty walking, depression, unusual weight change, abnormal bleeding, enlarged lymph nodes, angioedema, and breast masses.    Physical Exam  General:  Well-developed,well-nourished,in no acute distress; alert,appropriate  and cooperative throughout examination Head:  normocephalic and atraumatic.   Eyes:  pupils equal and pupils round.   Ears:  R ear normal and L ear normal.   Nose:  no external deformity and no nasal discharge.   Mouth:  good dentition and  pharynx pink and moist.   Neck:  supple and no masses.   Lungs:  normal respiratory effort and no wheezes.   Heart:  normal rate and regular rhythm.   Abdomen:  Bowel sounds positive,abdomen soft and non-tender without masses, organomegaly or hernias noted. Prostate:  no asymmetry.   Msk:  No deformity or scoliosis noted of thoracic or lumbar spine.   Pulses:  R and L carotid,radial,femoral,dorsalis pedis and posterior tibial pulses are full and equal bilaterally Extremities:  No clubbing, cyanosis, edema, or deformity noted with normal full range of motion of all joints.   Neurologic:  alert & oriented X3 and cranial nerves II-XII intact.     Impression & Recommendations:  Problem # 1:  IRITIS (ICD-364.3) still on steroids  Problem # 2:  HYPERTENSION (ICD-401.9)   pressures excellently stable on the Azor /no peripheral edema has been seen he appears to be tolerating the medication well His updated medication list for this problem includes:    Azor 5-40 Mg Tabs (Amlodipine-olmesartan) ..... One by mouth daily  BP today: 132/80 Prior BP: 136/80 (02/08/2010)  Prior 10 Yr Risk Heart Disease: 18 % (04/21/2009)  Labs Reviewed: K+: 4.8 (11/06/2009) Creat: : 1.0 (11/06/2009)   Chol: 162 (11/06/2009)   HDL: 41.40 (11/06/2009)   LDL: 84 (11/06/2009)   TG: 182.0 (11/06/2009)  Orders: TLB-BMP (Basic Metabolic Panel-BMET) (80048-METABOL)  Problem # 3:  HYPERLIPIDEMIA (ICD-272.4)  His updated medication list for this problem includes:    Zetia 10 Mg Tabs (Ezetimibe) .Marland Kitchen... Take 1 tablet by mouth once a day  Labs Reviewed: SGOT: 29 (01/27/2010)   SGPT: 37 (01/27/2010)  Lipid Goals: Chol Goal: 200 (10/22/2008)   HDL Goal: 40 (10/22/2008)   LDL Goal: 100 (10/22/2008)   TG Goal: 150 (10/22/2008)  Prior 10 Yr Risk Heart Disease: 18 % (04/21/2009)   HDL:41.40 (11/06/2009), 38.2 (04/14/2008)  LDL:84 (11/06/2009), 88 (04/14/2008)  Chol:162 (11/06/2009), 149 (04/14/2008)  Trig:182.0  (11/06/2009), 114 (04/14/2008)  Problem # 4:  DIABETES MELLITUS, TYPE II (ICD-250.00)   A1c will be ordered His updated medication list for this problem includes:    Azor 5-40 Mg Tabs (Amlodipine-olmesartan) ..... One by mouth daily    Janumet 50-1000 Mg Tabs (Sitagliptin-metformin hcl) ..... One by mouth two times a day  Labs Reviewed: Creat: 1.0 (11/06/2009)     Last Eye Exam: normal (02/10/2010) Reviewed HgBA1c results: 6.5 (01/27/2010)  8.5 (11/06/2009)  Orders: TLB-BMP (Basic Metabolic Panel-BMET) (80048-METABOL) Venipuncture (16109) TLB-A1C / Hgb A1C (Glycohemoglobin) (83036-A1C)  Complete Medication List: 1)  Zetia 10 Mg Tabs (Ezetimibe) .... Take 1 tablet by mouth once a day 2)  Azor 5-40 Mg Tabs (Amlodipine-olmesartan) .... One by mouth daily 3)  Janumet 50-1000 Mg Tabs (Sitagliptin-metformin hcl) .... One by mouth two times a day 4)  Onetouch Ultra Blue Strp (Glucose blood) .... Once daily 5)  Alrex 0.2 % Susp (Loteprednol etabonate) .... I drop in left eye once daily  Patient Instructions: 1)  July for CPX   Orders Added: 1)  Est. Patient Level IV [60454] 2)  TLB-BMP (Basic Metabolic Panel-BMET) [80048-METABOL] 3)  Venipuncture [36415] 4)  TLB-A1C / Hgb A1C (Glycohemoglobin) [83036-A1C]  Appended Document: Orders Update     Clinical  Lists Changes  Orders: Added new Service order of Specimen Handling (09811) - Signed

## 2010-06-27 ENCOUNTER — Other Ambulatory Visit: Payer: Self-pay | Admitting: Internal Medicine

## 2010-09-09 ENCOUNTER — Other Ambulatory Visit: Payer: Self-pay | Admitting: Internal Medicine

## 2010-09-17 NOTE — Procedures (Signed)
NAMELARENCE, THONE                   ACCOUNT NO.:  [id number]   MEDICAL RECORD NO.:  [id number]          PATIENT TYPE:  OUT   LOCATION:  SLEEP CENTER                 FACILITY:  [facility]   PHYSICIAN:  Barbaraann Share, MD,FCCPDATE OF BIRTH:  1940/10/09   DATE OF STUDY:  05/04/2006                            NOCTURNAL POLYSOMNOGRAM   REFERRING PHYSICIAN:  Darryll Capers MD   INDICATION FOR STUDY:  Hypersomnia with sleep apnea.   EPWORTH SLEEPINESS SCORE:  8   SLEEP ARCHITECTURE:  The patient had a total sleep time of 342 minutes  with significant quantity of REM but never achieved slow wave sleep.  Sleep onset latency was rapid at 4 minutes and REM onset was normal.  Sleep deficiency was decreased at 84%.   RESPIRATORY DATA:  The patient underwent split night protocol where he  was found to have 108 obstructive events in the first 136 minutes of  sleep.  This gave the patient a respiratory disturbance index of 48  events per hour over that time period.  The events were not positional  but there was very loud snoring noted throughout.  By protocol the  patient was then placed on a medium ComfortGel CPAP mask and titrated to  a final pressure of 7 cm of water with excellent control of his events  even through REM.   OXYGEN DATA:  His O2 saturation is as low as 76% with the patient's  obstructive events.   CARDIAC DATA:  Occasional PVCs noted throughout.   MOVEMENT-PARASOMNIA:  None.   IMPRESSIONS-RECOMMENDATIONS:  1. Split night study revealed severe obstructive sleep apnea/hypopnea      syndrome with a respiratory disturbance index of 48 events per hour      and O2 saturation as low as 76% in the first half of the night.      The patient was then placed on CPAP with a medium ComfortGel mask      and titrated to a final pressure of 7 centimeters with      excellent control.  2. Occasional premature ventricular contractions were noted throughout      the study.      Barbaraann Share, MD,FCCP  Diplomate, American Board of Sleep  Medicine  Electronically Signed     [facility]  D:  05/17/2006 15:54:57  T:  05/18/2006 07:26:37  Job:  [id number]

## 2010-12-03 ENCOUNTER — Encounter: Payer: Self-pay | Admitting: Internal Medicine

## 2010-12-03 ENCOUNTER — Other Ambulatory Visit (INDEPENDENT_AMBULATORY_CARE_PROVIDER_SITE_OTHER): Payer: Medicare Other

## 2010-12-03 DIAGNOSIS — Z0000 Encounter for general adult medical examination without abnormal findings: Secondary | ICD-10-CM

## 2010-12-03 DIAGNOSIS — IMO0002 Reserved for concepts with insufficient information to code with codable children: Secondary | ICD-10-CM

## 2010-12-03 DIAGNOSIS — I10 Essential (primary) hypertension: Secondary | ICD-10-CM

## 2010-12-03 DIAGNOSIS — Z125 Encounter for screening for malignant neoplasm of prostate: Secondary | ICD-10-CM

## 2010-12-03 DIAGNOSIS — E1165 Type 2 diabetes mellitus with hyperglycemia: Secondary | ICD-10-CM

## 2010-12-03 LAB — POCT URINALYSIS DIPSTICK
Blood, UA: NEGATIVE
Glucose, UA: NEGATIVE
Nitrite, UA: NEGATIVE
Urobilinogen, UA: 0.2
pH, UA: 6.5

## 2010-12-03 LAB — HEPATIC FUNCTION PANEL
Alkaline Phosphatase: 66 U/L (ref 39–117)
Bilirubin, Direct: 0.1 mg/dL (ref 0.0–0.3)
Total Protein: 7.1 g/dL (ref 6.0–8.3)

## 2010-12-03 LAB — LIPID PANEL
HDL: 37.6 mg/dL — ABNORMAL LOW (ref >39.00)
LDL Cholesterol: 93 mg/dL (ref 0–99)
Total CHOL/HDL Ratio: 4
Triglycerides: 96 mg/dL (ref 0.0–149.0)

## 2010-12-03 LAB — CBC WITH DIFFERENTIAL/PLATELET
Basophils Relative: 0.3 % (ref 0.0–3.0)
Eosinophils Absolute: 0.3 10*3/uL (ref 0.0–0.7)
Lymphocytes Relative: 17.6 % (ref 12.0–46.0)
MCHC: 33.4 g/dL (ref 30.0–36.0)
Neutrophils Relative %: 69.1 % (ref 43.0–77.0)
RBC: 4.57 Mil/uL (ref 4.22–5.81)
WBC: 6.4 10*3/uL (ref 4.5–10.5)

## 2010-12-03 LAB — BASIC METABOLIC PANEL
CO2: 31 mEq/L (ref 19–32)
Calcium: 9.5 mg/dL (ref 8.4–10.5)
Creatinine, Ser: 1 mg/dL (ref 0.4–1.5)
GFR: 78.51 mL/min (ref >60.00)

## 2010-12-09 ENCOUNTER — Other Ambulatory Visit: Payer: Self-pay | Admitting: *Deleted

## 2010-12-10 ENCOUNTER — Encounter: Payer: Self-pay | Admitting: Internal Medicine

## 2010-12-10 ENCOUNTER — Ambulatory Visit (INDEPENDENT_AMBULATORY_CARE_PROVIDER_SITE_OTHER): Payer: Medicare Other | Admitting: Internal Medicine

## 2010-12-10 VITALS — BP 160/90 | HR 72 | Temp 98.6°F | Resp 16 | Ht 72.0 in | Wt 225.0 lb

## 2010-12-10 DIAGNOSIS — IMO0002 Reserved for concepts with insufficient information to code with codable children: Secondary | ICD-10-CM

## 2010-12-10 DIAGNOSIS — I10 Essential (primary) hypertension: Secondary | ICD-10-CM

## 2010-12-10 DIAGNOSIS — Z0000 Encounter for general adult medical examination without abnormal findings: Secondary | ICD-10-CM

## 2010-12-10 DIAGNOSIS — E1165 Type 2 diabetes mellitus with hyperglycemia: Secondary | ICD-10-CM

## 2010-12-10 DIAGNOSIS — E1169 Type 2 diabetes mellitus with other specified complication: Secondary | ICD-10-CM

## 2010-12-10 NOTE — Progress Notes (Signed)
Subjective:    Patient ID: Walter [name], male    DOB: 07-04-1940, 70 y.o.   MRN: [id number]  Hypertension Pertinent negatives include no neck pain or shortness of breath.  Diabetes Pertinent negatives for diabetes include no fatigue and no weakness.    CPX Has gained weight Blood pressure is poorly controlled CBG's are increased This is due to diet out of control  We reviewed his current diet identified opportunities to reduce sugar and carbohydrate   Review of Systems  Constitutional: Negative for fever and fatigue.  HENT: Negative for hearing loss, congestion, neck pain and postnasal drip.   Eyes: Negative for discharge, redness and visual disturbance.  Respiratory: Negative for cough, shortness of breath and wheezing.   Cardiovascular: Negative for leg swelling.  Gastrointestinal: Negative for abdominal pain, constipation and abdominal distention.  Genitourinary: Negative for urgency and frequency.  Musculoskeletal: Negative for joint swelling and arthralgias.  Skin: Negative for color change and rash.  Neurological: Negative for weakness and light-headedness.  Hematological: Negative for adenopathy.  Psychiatric/Behavioral: Negative for behavioral problems.   Past Medical History  Diagnosis Date  . Hypertension   . Hyperlipidemia   . Diabetes mellitus    Past Surgical History  Procedure Date  . Colectomy   . Hemorrhoid surgery     reports that he has never smoked. He does not have any smokeless tobacco history on file. He reports that he drinks alcohol. He reports that he does not use illicit drugs. family history includes Cancer in an unspecified family member. Allergies not on file     Objective:   Physical Exam  Constitutional: He is oriented to person, place, and time. He appears well-developed and well-nourished.  HENT:  Head: Normocephalic and atraumatic.  Eyes: Conjunctivae are normal. Pupils are equal, round, and reactive to light.  Neck: Normal range of  motion. Neck supple.  Cardiovascular: Normal rate and regular rhythm.   Pulmonary/Chest: Effort normal and breath sounds normal.  Abdominal: Soft. Bowel sounds are normal.  Genitourinary: Prostate normal.  Musculoskeletal: Normal range of motion.  Neurological: He is alert and oriented to person, place, and time.  Skin: Skin is warm and dry.  Psychiatric: He has a normal mood and affect. His behavior is normal.          Assessment & Plan:   Patient presents for yearly preventative medicine examination.   all immunizations and health maintenance protocols were reviewed with the patient and they are up to date with these protocols.   screening laboratory values were reviewed with the patient including screening of hyperlipidemia PSA renal function and hepatic function.   There medications past medical history social history problem list and allergies were reviewed in detail.   Goals were established with regard to weight loss exercise diet in compliance with medication Essentially stable fullness examination with the exception of weight gain which is destabilized 2 issues.  His blood pressure is elevated at 160/90 after recheck it was 150/90 this is secondary to dietary indiscretion increased alcohol increase carbohydrates and significant weight gain.  He does not desire to increase medications at this time and wishes to control his blood pressure and diabetes through weight loss and resumption of a healthy diet.  We set a goal for a 10 pound weight loss he will monitor his blood pressure but does not reduce with limitation of salt that he will require additional blood pressure medications he will monitor his blood sugars will return in 3 months with A1c.

## 2010-12-11 ENCOUNTER — Other Ambulatory Visit: Payer: Self-pay | Admitting: Internal Medicine

## 2011-01-31 ENCOUNTER — Ambulatory Visit (INDEPENDENT_AMBULATORY_CARE_PROVIDER_SITE_OTHER): Payer: Medicare Other

## 2011-01-31 DIAGNOSIS — Z23 Encounter for immunization: Secondary | ICD-10-CM

## 2011-02-08 ENCOUNTER — Other Ambulatory Visit: Payer: Self-pay | Admitting: Internal Medicine

## 2011-04-11 ENCOUNTER — Other Ambulatory Visit (INDEPENDENT_AMBULATORY_CARE_PROVIDER_SITE_OTHER): Payer: Medicare Other

## 2011-04-11 DIAGNOSIS — IMO0002 Reserved for concepts with insufficient information to code with codable children: Secondary | ICD-10-CM

## 2011-04-11 DIAGNOSIS — E1169 Type 2 diabetes mellitus with other specified complication: Secondary | ICD-10-CM

## 2011-04-11 LAB — HEPATIC FUNCTION PANEL
Albumin: 4 g/dL (ref 3.5–5.2)
Alkaline Phosphatase: 59 U/L (ref 39–117)
Bilirubin, Direct: 0.1 mg/dL (ref 0.0–0.3)

## 2011-04-11 LAB — LIPID PANEL: Cholesterol: 128 mg/dL (ref 0–200)

## 2011-04-18 ENCOUNTER — Ambulatory Visit (INDEPENDENT_AMBULATORY_CARE_PROVIDER_SITE_OTHER): Payer: Medicare Other | Admitting: Internal Medicine

## 2011-04-18 ENCOUNTER — Encounter: Payer: Self-pay | Admitting: Internal Medicine

## 2011-04-18 VITALS — BP 155/80 | HR 72 | Temp 98.3°F | Resp 16 | Ht 72.0 in | Wt 222.0 lb

## 2011-04-18 DIAGNOSIS — I10 Essential (primary) hypertension: Secondary | ICD-10-CM

## 2011-04-18 DIAGNOSIS — E785 Hyperlipidemia, unspecified: Secondary | ICD-10-CM

## 2011-04-18 DIAGNOSIS — T887XXA Unspecified adverse effect of drug or medicament, initial encounter: Secondary | ICD-10-CM

## 2011-04-18 DIAGNOSIS — E119 Type 2 diabetes mellitus without complications: Secondary | ICD-10-CM

## 2011-04-18 NOTE — Patient Instructions (Signed)
The patient is instructed to continue all medications as prescribed. Schedule followup with check out clerk upon leaving the clinic  

## 2011-04-18 NOTE — Progress Notes (Signed)
Subjective:    Patient ID: Walter [name], male    DOB: 1941-03-23, 70 y.o.   MRN: [id number]  HPI followup of diabetes hypertension and hyperlipidemia. The patient has been noncompliant with diet he states that he knows that he is doing from a diabetic diet over the past 2 weeks ago he did not take his blood pressure medications today.  He denies any chest pain shortness of breath PND or orthopnea his CBGs at home have been elevated recently    Review of Systems  Constitutional: Negative for fever and fatigue.  HENT: Negative for hearing loss, congestion, neck pain and postnasal drip.   Eyes: Negative for discharge, redness and visual disturbance.  Respiratory: Negative for cough, shortness of breath and wheezing.   Cardiovascular: Negative for leg swelling.  Gastrointestinal: Negative for abdominal pain, constipation and abdominal distention.  Genitourinary: Negative for urgency and frequency.  Musculoskeletal: Negative for joint swelling and arthralgias.  Skin: Negative for color change and rash.  Neurological: Negative for weakness and light-headedness.  Hematological: Negative for adenopathy.  Psychiatric/Behavioral: Negative for behavioral problems.   Past Medical History  Diagnosis Date  . Hypertension   . Hyperlipidemia   . Diabetes mellitus     History   Social History  . Marital Status: Married    Spouse Name: N/A    Number of Children: N/A  . Years of Education: N/A   Occupational History  . Not on file.   Social History Main Topics  . Smoking status: Never Smoker   . Smokeless tobacco: Not on file   Comment: smoked for less that 6 months in 1960  . Alcohol Use: Yes  . Drug Use: No  . Sexually Active: Not on file   Other Topics Concern  . Not on file   Social History Narrative  . No narrative on file    Past Surgical History  Procedure Date  . Colectomy   . Hemorrhoid surgery     Family History  Problem Relation Age of Onset  . Cancer     colon/fhx    Not on File  Current Outpatient Prescriptions on File Prior to Visit  Medication Sig Dispense Refill  . AZOR 5-40 MG per tablet TAKE 1 TABLET BY MOUTH EVERY DAY  30 tablet  4  . glucose blood test strip 1 each by Other route as needed. Use as instructed       . JANUMET 50-1000 MG per tablet TAKE 1 TABLET BY MOUTH TWICE A DAY  60 tablet  6  . ZETIA 10 MG tablet TAKE 1 TABLET BY MOUTH ONCE A DAY  30 tablet  7    BP 155/80  Pulse 72  Temp 98.3 F (36.8 C)  Resp 16  Ht 6' (1.829 m)  Wt 222 lb (100.699 kg)  BMI 30.11 kg/m2        Objective:   Physical Exam  Nursing note and vitals reviewed. Constitutional: He appears well-developed and well-nourished.  HENT:  Head: Normocephalic and atraumatic.  Eyes: Conjunctivae are normal. Pupils are equal, round, and reactive to light.  Neck: Normal range of motion. Neck supple.  Cardiovascular: Normal rate and regular rhythm.   Pulmonary/Chest: Effort normal and breath sounds normal.  Abdominal: Soft. Bowel sounds are normal.          Assessment & Plan:  This is a screening of diabetes hyperlipidemia and hypertension.  His blood pressure is elevated today because he did not take his medications prior to his office  visit we discussed the need not to skip his medications he has a fasting office visit but she should do should take his blood pressure medicines every morning.  His cholesterol is in good control with the exception of triglycerides which were elevated with increased warmth and glucose consumption a vacation and will be probably stress consistency of diet.  his A1c is also elevated again secondary to dietary noncompliance we discussed weight and dietary compliance

## 2011-05-03 ENCOUNTER — Other Ambulatory Visit: Payer: Self-pay | Admitting: Internal Medicine

## 2011-05-10 ENCOUNTER — Other Ambulatory Visit: Payer: Self-pay | Admitting: Internal Medicine

## 2011-05-11 ENCOUNTER — Telehealth: Payer: Self-pay | Admitting: *Deleted

## 2011-05-11 MED ORDER — METFORMIN HCL 1000 MG PO TABS: 1000.0000 mg | ORAL_TABLET | Freq: Two times a day (BID) | ORAL | Status: DC

## 2011-05-11 NOTE — Telephone Encounter (Signed)
Notified pt. 

## 2011-05-11 NOTE — Telephone Encounter (Signed)
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

## 2011-05-11 NOTE — Telephone Encounter (Signed)
Per dr Lovell Sheehan may discontinue janumet and start glucophage 1000mg  twice a day

## 2011-07-27 ENCOUNTER — Encounter: Payer: Self-pay | Admitting: Gastroenterology

## 2011-08-08 ENCOUNTER — Encounter: Payer: Self-pay | Admitting: Gastroenterology

## 2011-08-11 ENCOUNTER — Other Ambulatory Visit (INDEPENDENT_AMBULATORY_CARE_PROVIDER_SITE_OTHER): Payer: Medicare Other

## 2011-08-11 DIAGNOSIS — E119 Type 2 diabetes mellitus without complications: Secondary | ICD-10-CM

## 2011-08-12 LAB — HEMOGLOBIN A1C: Hgb A1c MFr Bld: 8.9 % — ABNORMAL HIGH (ref 4.6–6.5)

## 2011-08-17 ENCOUNTER — Ambulatory Visit (INDEPENDENT_AMBULATORY_CARE_PROVIDER_SITE_OTHER): Payer: Medicare Other | Admitting: Internal Medicine

## 2011-08-17 ENCOUNTER — Encounter: Payer: Self-pay | Admitting: Internal Medicine

## 2011-08-17 VITALS — BP 130/80 | HR 72 | Temp 98.2°F | Resp 16 | Ht 72.0 in | Wt 216.0 lb

## 2011-08-17 DIAGNOSIS — E1169 Type 2 diabetes mellitus with other specified complication: Secondary | ICD-10-CM

## 2011-08-17 DIAGNOSIS — IMO0002 Reserved for concepts with insufficient information to code with codable children: Secondary | ICD-10-CM

## 2011-08-17 DIAGNOSIS — E1165 Type 2 diabetes mellitus with hyperglycemia: Secondary | ICD-10-CM

## 2011-08-17 MED ORDER — SITAGLIPTIN PHOS-METFORMIN HCL 50-1000 MG PO TABS: 1.0000 | ORAL_TABLET | Freq: Two times a day (BID) | ORAL | Status: DC

## 2011-08-17 NOTE — Patient Instructions (Signed)
The patient is instructed to continue all medications as prescribed. Schedule followup with check out clerk upon leaving the clinic  

## 2011-08-17 NOTE — Progress Notes (Signed)
  Subjective:    Patient ID: Walter [name], male    DOB: 1940/10/04, 71 y.o.   MRN: [id number]  HPI  Marked loss of control on the [place] Concern over reported side effects of this drug lead him to discontinuation of the janumet.  Review of Systems  Constitutional: Negative for fever and fatigue.  HENT: Negative for hearing loss, congestion, neck pain and postnasal drip.   Eyes: Negative for discharge, redness and visual disturbance.  Respiratory: Negative for cough, shortness of breath and wheezing.   Cardiovascular: Negative for leg swelling.  Gastrointestinal: Negative for abdominal pain, constipation and abdominal distention.  Genitourinary: Negative for urgency and frequency.  Musculoskeletal: Negative for joint swelling and arthralgias.  Skin: Negative for color change and rash.  Neurological: Negative for weakness and light-headedness.  Hematological: Negative for adenopathy.  Psychiatric/Behavioral: Negative for behavioral problems.   Past Medical History  Diagnosis Date  . Hypertension   . Hyperlipidemia   . Diabetes mellitus     History   Social History  . Marital Status: Married    Spouse Name: N/A    Number of Children: N/A  . Years of Education: N/A   Occupational History  . Not on file.   Social History Main Topics  . Smoking status: Never Smoker   . Smokeless tobacco: Not on file   Comment: smoked for less that 6 months in 1960  . Alcohol Use: Yes  . Drug Use: No  . Sexually Active: Not on file   Other Topics Concern  . Not on file   Social History Narrative  . No narrative on file    Past Surgical History  Procedure Date  . Colectomy   . Hemorrhoid surgery     Family History  Problem Relation Age of Onset  . Cancer      colon/fhx    No Known Allergies  Current Outpatient Prescriptions on File Prior to Visit  Medication Sig Dispense Refill  . AZOR 5-40 MG per tablet TAKE 1 TABLET BY MOUTH EVERY DAY  30 tablet  4  . ONE TOUCH ULTRA  TEST test strip USE ONCE A DAY AS DIRECTED  100 each  1  . ZETIA 10 MG tablet TAKE 1 TABLET BY MOUTH ONCE A DAY  30 tablet  7  . sitaGLIPtan-metformin (JANUMET) 50-1000 MG per tablet Take 1 tablet by mouth 2 (two) times daily with a meal.  60 tablet  11    BP 130/80  Pulse 72  Temp 98.2 F (36.8 C)  Resp 16  Ht 6' (1.829 m)  Wt 216 lb (97.977 kg)  BMI 29.29 kg/m2       Objective:   Physical Exam  Constitutional: He appears well-developed and well-nourished.  HENT:  Head: Normocephalic and atraumatic.  Eyes: Conjunctivae are normal. Pupils are equal, round, and reactive to light.  Neck: Normal range of motion. Neck supple.  Cardiovascular: Normal rate and regular rhythm.   Pulmonary/Chest: Effort normal and breath sounds normal.  Abdominal: Soft. Bowel sounds are normal.          Assessment & Plan:  Patient had discontinued the metformin to 2 some reports yet read and his diabetic control has significantly worsened.  We discussed the safety profile the metformin and the Januvia for him and discussed appropriate monitoring to make sure that he remains stable and his medications.  Reviewed current package insert and warnings and decided together to resume the combination Januvia and metformin drug for diabetic control

## 2011-09-04 ENCOUNTER — Other Ambulatory Visit: Payer: Self-pay | Admitting: Internal Medicine

## 2011-09-22 ENCOUNTER — Encounter: Payer: Self-pay | Admitting: Gastroenterology

## 2011-09-22 ENCOUNTER — Ambulatory Visit (AMBULATORY_SURGERY_CENTER): Payer: Medicare Other | Admitting: *Deleted

## 2011-09-22 VITALS — Ht 72.0 in | Wt 218.1 lb

## 2011-09-22 DIAGNOSIS — Z85038 Personal history of other malignant neoplasm of large intestine: Secondary | ICD-10-CM

## 2011-09-22 DIAGNOSIS — Z1211 Encounter for screening for malignant neoplasm of colon: Secondary | ICD-10-CM

## 2011-09-22 MED ORDER — PEG-KCL-NACL-NASULF-NA ASC-C 100 G PO SOLR: ORAL | Status: DC

## 2011-10-06 ENCOUNTER — Encounter: Payer: Self-pay | Admitting: Gastroenterology

## 2011-10-06 ENCOUNTER — Ambulatory Visit (AMBULATORY_SURGERY_CENTER): Payer: Medicare Other | Admitting: Gastroenterology

## 2011-10-06 VITALS — BP 146/87 | HR 64 | Temp 97.4°F | Resp 16 | Ht 72.0 in | Wt 218.0 lb

## 2011-10-06 DIAGNOSIS — D126 Benign neoplasm of colon, unspecified: Secondary | ICD-10-CM

## 2011-10-06 DIAGNOSIS — Z1211 Encounter for screening for malignant neoplasm of colon: Secondary | ICD-10-CM

## 2011-10-06 DIAGNOSIS — Z85038 Personal history of other malignant neoplasm of large intestine: Secondary | ICD-10-CM

## 2011-10-06 MED ORDER — SODIUM CHLORIDE 0.9 % IV SOLN: 500.0000 mL | INTRAVENOUS | Status: DC

## 2011-10-06 NOTE — Progress Notes (Signed)
Patient did not experience any of the following events: a burn prior to discharge; a fall within the facility; wrong site/side/patient/procedure/implant event; or a hospital transfer or hospital admission upon discharge from the facility. (G8907) Patient did not have preoperative order for IV antibiotic SSI prophylaxis. (G8918)  

## 2011-10-06 NOTE — Op Note (Signed)
[facility] [address] [place], [place]  [address]  COLONOSCOPY PROCEDURE REPORT PATIENT:  Walter [name], Walter [name]  MR#:  [id number] BIRTHDATE:  11-Dec-1940, 70 yrs. old  GENDER:  male ENDOSCOPIST:  Judie Petit T. Russella Dar, MD, [facility]  PROCEDURE DATE:  10/06/2011 PROCEDURE:  Colonoscopy with snare polypectomy ASA CLASS:  Class II INDICATIONS:  1) surveillance and high-risk screening  2) history of colon cancer: 1993 MEDICATIONS:   These medications were titrated to patient response per physician's verbal order, Fentanyl 50 mcg IV, Versed 6 mg IV DESCRIPTION OF PROCEDURE:   After the risks benefits and alternatives of the procedure were thoroughly explained, informed consent was obtained.  Digital rectal exam was performed and revealed no abnormalities.   The LB [id number] endoscope was introduced through the anus and advanced to the cecum, which was identified by both the appendix and ileocecal valve, without limitations.  The quality of the prep was good, using MoviPrep. The instrument was then slowly withdrawn as the colon was fully examined. <<PROCEDUREIMAGES>> FINDINGS:  A sessile polyp was found in the cecum. It was 5 mm in size. Polyp was snared without cautery. Retrieval was successful. A sessile polyp was found at the ileocecal valve. It was 5 mm in size. Polyp was snared without cautery. Retrieval was successful. Mild diverticulosis was found in the descending colon.  There was evidence of a prior segmental colectomy in the sigmoid colon. Otherwise normal colonoscopy without other polyps, masses, vascular ectasias, or inflammatory changes.  Retroflexed views in the rectum revealed internal hemorrhoids, hypertrophied anal papillae. The time to cecum =  2.25  minutes. The scope was then withdrawn (time =  10  min) from the patient and the procedure completed.  COMPLICATIONS:  None  ENDOSCOPIC IMPRESSION: 1) 5 mm sessile polyp in the cecum 2) 5 mm sessile polyp at the  ileocecal valve 3) Mild diverticulosis in the descending colon 4) Prior segmental colectomy in the sigmoid colon 5) Internal hemorrhoids 6) Hypertrophied anal papillae  RECOMMENDATIONS: 1) Await pathology results 2) High fiber diet with liberal fluid intake. 3) Repeat Colonoscopy in 3 years.  Venita Lick. Russella Dar, MD, [facility]  n. eSIGNED:   Venita Lick. Kaliel Bolds at 10/06/2011 10:53 AM  [name], [id number]

## 2011-10-06 NOTE — Patient Instructions (Signed)
Findings:  Polyps, Mild diverticulosis, Internal Hemorrhoids Recommendations:  High Fiber Diet and liberal fluid intake, Repeat colonoscopy in 3 years.  YOU HAD AN ENDOSCOPIC PROCEDURE TODAY AT THE [facility]: Refer to the procedure report that was given to you for any specific questions about what was found during the examination.  If the procedure report does not answer your questions, please call your gastroenterologist to clarify.  If you requested that your care partner not be given the details of your procedure findings, then the procedure report has been included in a sealed envelope for you to review at your convenience later.  YOU SHOULD EXPECT: Some feelings of bloating in the abdomen. Passage of more gas than usual.  Walking can help get rid of the air that was put into your GI tract during the procedure and reduce the bloating. If you had a lower endoscopy (such as a colonoscopy or flexible sigmoidoscopy) you may notice spotting of blood in your stool or on the toilet paper. If you underwent a bowel prep for your procedure, then you may not have a normal bowel movement for a few days.  DIET: Your first meal following the procedure should be a light meal and then it is ok to progress to your normal diet.  A half-sandwich or bowl of soup is an example of a good first meal.  Heavy or fried foods are harder to digest and may make you feel nauseous or bloated.  Likewise meals heavy in dairy and vegetables can cause extra gas to form and this can also increase the bloating.  Drink plenty of fluids but you should avoid alcoholic beverages for 24 hours.  ACTIVITY: Your care partner should take you home directly after the procedure.  You should plan to take it easy, moving slowly for the rest of the day.  You can resume normal activity the day after the procedure however you should NOT DRIVE or use heavy machinery for 24 hours (because of the sedation medicines used during the test).     SYMPTOMS TO REPORT IMMEDIATELY: A gastroenterologist can be reached at any hour.  During normal business hours, 8:30 AM to 5:00 PM Monday through Friday, call [phone].  After hours and on weekends, please call the GI answering service at [phone] who will take a message and have the physician on call contact you.   Following lower endoscopy (colonoscopy or flexible sigmoidoscopy):  Excessive amounts of blood in the stool  Significant tenderness or worsening of abdominal pains  Swelling of the abdomen that is new, acute  Fever of 100F or higher  Following upper endoscopy (EGD)  Vomiting of blood or coffee ground material  New chest pain or pain under the shoulder blades  Painful or persistently difficult swallowing  New shortness of breath  Fever of 100F or higher  Black, tarry-looking stools  FOLLOW UP: If any biopsies were taken you will be contacted by phone or by letter within the next 1-3 weeks.  Call your gastroenterologist if you have not heard about the biopsies in 3 weeks.  Our staff will call the home number listed on your records the next business day following your procedure to check on you and address any questions or concerns that you may have at that time regarding the information given to you following your procedure. This is a courtesy call and so if there is no answer at the home number and we have not heard from you through the emergency physician  on call, we will assume that you have returned to your regular daily activities without incident.  SIGNATURES/CONFIDENTIALITY: You and/or your care partner have signed paperwork which will be entered into your electronic medical record.  These signatures attest to the fact that that the information above on your After Visit Summary has been reviewed and is understood.  Full responsibility of the confidentiality of this discharge information lies with you and/or your care-partner.   Please follow all discharge  instructions given to you by the recovery room nurse. If you have any questions or problems after discharge please call one of the numbers listed above. You will receive a phone call in the am to see how you are doing and answer any questions you may have. Thank you for choosing [facility] for your health care needs.

## 2011-10-07 ENCOUNTER — Telehealth: Payer: Self-pay | Admitting: *Deleted

## 2011-10-07 NOTE — Telephone Encounter (Signed)
   Follow up Call-  Call back number 10/06/2011  Post procedure Call Back phone  # [phone]  Permission to leave phone message Yes     Patient questions:  Do you have a fever, pain , or abdominal swelling? no Pain Score  0 *  Have you tolerated food without any problems? yes  Have you been able to return to your normal activities? yes  Do you have any questions about your discharge instructions: Diet   no Medications  no Follow up visit  no  Do you have questions or concerns about your Care? no  Actions: * If pain score is 4 or above: No action needed, pain <4.

## 2011-10-08 ENCOUNTER — Other Ambulatory Visit: Payer: Self-pay | Admitting: Internal Medicine

## 2011-10-12 ENCOUNTER — Encounter: Payer: Self-pay | Admitting: Gastroenterology

## 2011-11-01 ENCOUNTER — Other Ambulatory Visit (INDEPENDENT_AMBULATORY_CARE_PROVIDER_SITE_OTHER): Payer: Medicare Other

## 2011-11-01 DIAGNOSIS — E1169 Type 2 diabetes mellitus with other specified complication: Secondary | ICD-10-CM

## 2011-11-01 DIAGNOSIS — IMO0002 Reserved for concepts with insufficient information to code with codable children: Secondary | ICD-10-CM

## 2011-11-01 DIAGNOSIS — E1165 Type 2 diabetes mellitus with hyperglycemia: Secondary | ICD-10-CM

## 2011-11-01 LAB — HEPATIC FUNCTION PANEL
AST: 30 U/L (ref 0–37)
Alkaline Phosphatase: 56 U/L (ref 39–117)
Total Bilirubin: 0.8 mg/dL (ref 0.3–1.2)

## 2011-11-01 NOTE — Addendum Note (Signed)
Addended by: Rita Ohara R on: 11/01/2011 08:26 AM   Modules accepted: Orders

## 2011-11-07 ENCOUNTER — Telehealth: Payer: Self-pay | Admitting: Internal Medicine

## 2011-11-07 NOTE — Telephone Encounter (Signed)
Reviewed the results with the patient.  I have mailed another copy of the letter.  He will call back for any additional questions or concerns

## 2011-11-16 ENCOUNTER — Ambulatory Visit (INDEPENDENT_AMBULATORY_CARE_PROVIDER_SITE_OTHER): Payer: Medicare Other | Admitting: Internal Medicine

## 2011-11-16 ENCOUNTER — Encounter: Payer: Self-pay | Admitting: Internal Medicine

## 2011-11-16 VITALS — BP 130/80 | HR 72 | Temp 98.6°F | Resp 16 | Ht 72.0 in | Wt 214.0 lb

## 2011-11-16 DIAGNOSIS — I10 Essential (primary) hypertension: Secondary | ICD-10-CM

## 2011-11-16 DIAGNOSIS — E785 Hyperlipidemia, unspecified: Secondary | ICD-10-CM

## 2011-11-16 DIAGNOSIS — IMO0001 Reserved for inherently not codable concepts without codable children: Secondary | ICD-10-CM

## 2011-11-16 NOTE — Patient Instructions (Signed)
The patient is instructed to continue all medications as prescribed. Schedule followup with check out clerk upon leaving the clinic  

## 2011-11-16 NOTE — Progress Notes (Signed)
Subjective:    Patient ID: Walter [name], male    DOB: 03-13-1941, 71 y.o.   MRN: [id number]  HPI  reviewed diet and exercise and results of CBG's   Review of Systems  Constitutional: Negative for fever and fatigue.  HENT: Negative for hearing loss, congestion, neck pain and postnasal drip.   Eyes: Negative for discharge, redness and visual disturbance.  Respiratory: Negative for cough, shortness of breath and wheezing.   Cardiovascular: Negative for leg swelling.  Gastrointestinal: Negative for abdominal pain, constipation and abdominal distention.  Genitourinary: Negative for urgency and frequency.  Musculoskeletal: Negative for joint swelling and arthralgias.  Skin: Negative for color change and rash.  Neurological: Negative for weakness and light-headedness.  Hematological: Negative for adenopathy.  Psychiatric/Behavioral: Negative for behavioral problems.   Past Medical History  Diagnosis Date  . Hypertension   . Hyperlipidemia   . Diabetes mellitus   . History of shingles   . Colon cancer 1992    colon resection 1992  . Sleep apnea     cpap    History   Social History  . Marital Status: Married    Spouse Name: N/A    Number of Children: N/A  . Years of Education: N/A   Occupational History  . Not on file.   Social History Main Topics  . Smoking status: Never Smoker   . Smokeless tobacco: Never Used   Comment: smoked for less that 6 months in 1960  . Alcohol Use: 1.2 oz/week    2 Cans of beer per week  . Drug Use: No  . Sexually Active: Not on file   Other Topics Concern  . Not on file   Social History Narrative  . No narrative on file    Past Surgical History  Procedure Date  . Colectomy 1992  . Appendectomy 1949    Family History  Problem Relation Age of Onset  . Cancer      colon/fhx  . Colon cancer Father 40  . Rectal cancer Maternal Uncle   . Colon cancer Paternal Grandmother 15    No Known Allergies  Current Outpatient  Prescriptions on File Prior to Visit  Medication Sig Dispense Refill  . AZOR 5-40 MG per tablet TAKE 1 TABLET BY MOUTH EVERY DAY  30 tablet  4  . Loteprednol Etabonate (ALREX OP) Apply to eye daily. 1 drop daily to left eye      . ONE TOUCH ULTRA TEST test strip USE ONCE A DAY AS DIRECTED  100 each  1  . sitaGLIPtan-metformin (JANUMET) 50-1000 MG per tablet Take 1 tablet by mouth 2 (two) times daily with a meal.  60 tablet  11  . ZETIA 10 MG tablet TAKE 1 TABLET BY MOUTH ONCE A DAY  30 tablet  7    BP 130/80  Pulse 72  Temp 98.6 F (37 C)  Resp 16  Ht 6' (1.829 m)  Wt 214 lb (97.07 kg)  BMI 29.02 kg/m2       Objective:   Physical Exam  Nursing note and vitals reviewed. Constitutional: He appears well-developed and well-nourished.  HENT:  Head: Normocephalic and atraumatic.  Eyes: Conjunctivae are normal. Pupils are equal, round, and reactive to light.  Neck: Normal range of motion. Neck supple.  Cardiovascular: Normal rate and regular rhythm.   Pulmonary/Chest: Effort normal and breath sounds normal.  Abdominal: Soft. Bowel sounds are normal.          Assessment & Plan:  monitoring  Glucoses and CBG's have been reduced HTN stable monitering AIC the drop of to 7.6 Weight loss and recent a1c's reflect   Set goal for weight reduction

## 2012-01-03 ENCOUNTER — Other Ambulatory Visit: Payer: Self-pay | Admitting: Internal Medicine

## 2012-01-03 ENCOUNTER — Other Ambulatory Visit: Payer: Self-pay | Admitting: *Deleted

## 2012-01-03 MED ORDER — SITAGLIP PHOS-METFORMIN HCL ER 50-1000 MG PO TB24: 1.0000 | ORAL_TABLET | Freq: Every day | ORAL | Status: DC

## 2012-01-06 ENCOUNTER — Telehealth: Payer: Self-pay | Admitting: Internal Medicine

## 2012-01-06 MED ORDER — SITAGLIP PHOS-METFORMIN HCL ER 50-1000 MG PO TB24: 2.0000 | ORAL_TABLET | Freq: Every day | ORAL | Status: DC

## 2012-01-06 NOTE — Telephone Encounter (Signed)
rx sent in electronically 

## 2012-01-06 NOTE — Telephone Encounter (Signed)
Pt called re: SitaGLIPtin-MetFORMIN HCl (JANUMET XR) 50-1000 MG TB24. Pt said that Dr Lovell Sheehan had increase dosage instructions to 2 per day, but last script that was sent to [facility] on [address] was for 1 per day. Pls correct script to 2 per day and send to pharmacy asap.

## 2012-01-16 ENCOUNTER — Ambulatory Visit (INDEPENDENT_AMBULATORY_CARE_PROVIDER_SITE_OTHER): Payer: Medicare Other

## 2012-01-16 DIAGNOSIS — Z23 Encounter for immunization: Secondary | ICD-10-CM

## 2012-03-05 ENCOUNTER — Other Ambulatory Visit: Payer: Self-pay | Admitting: Internal Medicine

## 2012-03-12 ENCOUNTER — Ambulatory Visit (INDEPENDENT_AMBULATORY_CARE_PROVIDER_SITE_OTHER): Payer: Medicare Other | Admitting: Internal Medicine

## 2012-03-12 ENCOUNTER — Encounter: Payer: Self-pay | Admitting: Internal Medicine

## 2012-03-12 VITALS — BP 135/84 | HR 68 | Temp 98.2°F | Resp 16 | Ht 72.0 in | Wt 218.0 lb

## 2012-03-12 DIAGNOSIS — IMO0001 Reserved for inherently not codable concepts without codable children: Secondary | ICD-10-CM

## 2012-03-12 DIAGNOSIS — I10 Essential (primary) hypertension: Secondary | ICD-10-CM

## 2012-03-12 LAB — BASIC METABOLIC PANEL
CO2: 27 mEq/L (ref 19–32)
Chloride: 102 mEq/L (ref 96–112)
Sodium: 137 mEq/L (ref 135–145)

## 2012-03-12 LAB — HEMOGLOBIN A1C: Hgb A1c MFr Bld: 7.5 % — ABNORMAL HIGH (ref 4.6–6.5)

## 2012-03-12 NOTE — Patient Instructions (Addendum)
The patient is instructed to continue all medications as prescribed. Schedule followup with check out clerk upon leaving the clinic  

## 2012-03-12 NOTE — Progress Notes (Signed)
Subjective:    Patient ID: Walter [name], male    DOB: 1941/03/09, 71 y.o.   MRN: [id number]  HPI HTN  and DM  Follow up Weight gain noted No chest pains Mild early neuropathy?    Review of Systems  Constitutional: Negative for fever and fatigue.  HENT: Negative for hearing loss, congestion, neck pain and postnasal drip.   Eyes: Negative for discharge, redness and visual disturbance.  Respiratory: Negative for cough, shortness of breath and wheezing.   Cardiovascular: Negative for leg swelling.  Gastrointestinal: Negative for abdominal pain, constipation and abdominal distention.  Genitourinary: Negative for urgency and frequency.  Musculoskeletal: Negative for joint swelling and arthralgias.  Skin: Negative for color change and rash.  Neurological: Negative for weakness and light-headedness.  Hematological: Negative for adenopathy.  Psychiatric/Behavioral: Negative for behavioral problems.   Past Medical History  Diagnosis Date  . Hypertension   . Hyperlipidemia   . Diabetes mellitus   . History of shingles   . Colon cancer 1992    colon resection 1992  . Sleep apnea     cpap    History   Social History  . Marital Status: Married    Spouse Name: N/A    Number of Children: N/A  . Years of Education: N/A   Occupational History  . Not on file.   Social History Main Topics  . Smoking status: Never Smoker   . Smokeless tobacco: Never Used     Comment: smoked for less that 6 months in 1960  . Alcohol Use: 1.2 oz/week    2 Cans of beer per week  . Drug Use: No  . Sexually Active: Not on file   Other Topics Concern  . Not on file   Social History Narrative  . No narrative on file    Past Surgical History  Procedure Date  . Colectomy 1992  . Appendectomy 1949    Family History  Problem Relation Age of Onset  . Cancer      colon/fhx  . Colon cancer Father 31  . Rectal cancer Maternal Uncle   . Colon cancer Paternal Grandmother 45    No Known  Allergies  Current Outpatient Prescriptions on File Prior to Visit  Medication Sig Dispense Refill  . AZOR 5-40 MG per tablet TAKE 1 TABLET BY MOUTH EVERY DAY  30 tablet  4  . Loteprednol Etabonate (ALREX OP) Apply to eye daily. 1 drop daily to left eye      . ONE TOUCH ULTRA TEST test strip USE ONCE A DAY AS DIRECTED  100 each  1  . SitaGLIPtin-MetFORMIN HCl (JANUMET XR) 50-1000 MG TB24 Take 2 tablets by mouth daily.  60 tablet  11  . ZETIA 10 MG tablet TAKE 1 TABLET BY MOUTH ONCE A DAY  30 tablet  7    BP 135/84  Pulse 68  Temp 98.2 F (36.8 C)  Resp 16  Ht 6' (1.829 m)  Wt 218 lb (98.884 kg)  BMI 29.57 kg/m2        Objective:   Physical Exam  Nursing note and vitals reviewed. Constitutional: He appears well-developed and well-nourished.  HENT:  Head: Normocephalic and atraumatic.  Eyes: Conjunctivae normal are normal. Pupils are equal, round, and reactive to light.  Neck: Normal range of motion. Neck supple.  Cardiovascular: Normal rate and regular rhythm.   Pulmonary/Chest: Effort normal and breath sounds normal.  Abdominal: Soft. Bowel sounds are normal.  Assessment & Plan:  Probable worsened DM control with weight gain and diet changes due to travel urgered exercise and weight loss goal A1c is still less that 7 Stable blood pressure on azor ( ARB) Lipids to be check next OV

## 2012-05-31 ENCOUNTER — Other Ambulatory Visit: Payer: Self-pay | Admitting: Internal Medicine

## 2012-07-01 ENCOUNTER — Other Ambulatory Visit: Payer: Self-pay | Admitting: Internal Medicine

## 2012-07-30 ENCOUNTER — Other Ambulatory Visit: Payer: Self-pay | Admitting: Internal Medicine

## 2012-09-12 ENCOUNTER — Ambulatory Visit: Payer: Medicare Other | Admitting: Internal Medicine

## 2012-09-13 ENCOUNTER — Ambulatory Visit (INDEPENDENT_AMBULATORY_CARE_PROVIDER_SITE_OTHER): Payer: Medicare Other | Admitting: Internal Medicine

## 2012-09-13 ENCOUNTER — Encounter: Payer: Self-pay | Admitting: Internal Medicine

## 2012-09-13 VITALS — BP 130/80 | HR 72 | Temp 98.3°F | Resp 16 | Ht 72.0 in | Wt 214.0 lb

## 2012-09-13 DIAGNOSIS — I10 Essential (primary) hypertension: Secondary | ICD-10-CM

## 2012-09-13 DIAGNOSIS — IMO0001 Reserved for inherently not codable concepts without codable children: Secondary | ICD-10-CM

## 2012-09-13 LAB — BASIC METABOLIC PANEL
BUN: 18 mg/dL (ref 6–23)
CO2: 27 mEq/L (ref 19–32)
Chloride: 101 mEq/L (ref 96–112)
Glucose, Bld: 252 mg/dL — ABNORMAL HIGH (ref 70–99)
Potassium: 4.4 mEq/L (ref 3.5–5.1)

## 2012-09-13 NOTE — Patient Instructions (Signed)
The patient is instructed to continue all medications as prescribed. Schedule followup with check out clerk upon leaving the clinic  

## 2012-09-13 NOTE — Progress Notes (Signed)
Subjective:    Patient ID: Walter [name], male    DOB: 06-26-1940, 72 y.o.   MRN: [id number]  Hypertension This is a chronic problem. The current episode started more than 1 year ago. The problem is controlled. Pertinent negatives include no anxiety, blurred vision, chest pain, headaches, malaise/fatigue, neck pain, orthopnea, palpitations, peripheral edema, PND, shortness of breath or sweats. Risk factors for coronary artery disease include diabetes mellitus and family history. The current treatment provides significant improvement. There are no compliance problems.  There is no history of chronic renal disease, coarctation of the aorta, hyperparathyroidism, a hypertension causing med or sleep apnea.  Diabetes He presents for his follow-up diabetic visit. He has type 2 diabetes mellitus. His disease course has been improving. Pertinent negatives for hypoglycemia include no headaches or sweats. Pertinent negatives for diabetes include no blurred vision, no chest pain, no fatigue and no weakness. There are no hypoglycemic complications. Symptoms are stable. There are no diabetic complications. Risk factors for coronary artery disease include diabetes mellitus, dyslipidemia and male sex. He has not had a previous visit with a dietician. He participates in exercise three times a week. He does not see a podiatrist.Eye exam is current.      Review of Systems  Constitutional: Negative for fever, malaise/fatigue and fatigue.  HENT: Negative for hearing loss, congestion, neck pain and postnasal drip.   Eyes: Negative for blurred vision, discharge, redness and visual disturbance.  Respiratory: Negative for cough, shortness of breath and wheezing.   Cardiovascular: Negative for chest pain, palpitations, orthopnea, leg swelling and PND.  Gastrointestinal: Negative for abdominal pain, constipation and abdominal distention.  Genitourinary: Negative for urgency and frequency.  Musculoskeletal: Positive for gait  problem. Negative for joint swelling and arthralgias.       Due to bruising  Skin: Positive for color change. Negative for rash.  Neurological: Negative for weakness, light-headedness and headaches.  Hematological: Negative for adenopathy.  Psychiatric/Behavioral: Negative for behavioral problems.   Past Medical History  Diagnosis Date  . Hypertension   . Hyperlipidemia   . Diabetes mellitus   . History of shingles   . Colon cancer 1992    colon resection 1992  . Sleep apnea     cpap    History   Social History  . Marital Status: Married    Spouse Name: N/A    Number of Children: N/A  . Years of Education: N/A   Occupational History  . Not on file.   Social History Main Topics  . Smoking status: Never Smoker   . Smokeless tobacco: Never Used     Comment: smoked for less that 6 months in 1960  . Alcohol Use: 1.2 oz/week    2 Cans of beer per week  . Drug Use: No  . Sexually Active: Not on file   Other Topics Concern  . Not on file   Social History Narrative  . No narrative on file    Past Surgical History  Procedure Laterality Date  . Colectomy  1992  . Appendectomy  1949    Family History  Problem Relation Age of Onset  . Cancer      colon/fhx  . Colon cancer Father 3  . Rectal cancer Maternal Uncle   . Colon cancer Paternal Grandmother 67    No Known Allergies  Current Outpatient Prescriptions on File Prior to Visit  Medication Sig Dispense Refill  . AZOR 5-40 MG per tablet TAKE 1 TABLET BY MOUTH EVERY DAY  30  tablet  4  . Loteprednol Etabonate (ALREX OP) Apply to eye daily. 1 drop daily to left eye      . ONE TOUCH ULTRA TEST test strip USE ONCE DAILY AS DIRECTED  100 each  11  . SitaGLIPtin-MetFORMIN HCl (JANUMET XR) 50-1000 MG TB24 Take 2 tablets by mouth daily.  60 tablet  11  . ZETIA 10 MG tablet TAKE 1 TABLET BY MOUTH ONCE A DAY  30 tablet  7   No current facility-administered medications on file prior to visit.    BP 130/80  Pulse 72   Temp(Src) 98.3 F (36.8 C)  Resp 16  Ht 6' (1.829 m)  Wt 214 lb (97.07 kg)  BMI 29.02 kg/m2       Objective:   Physical Exam  Nursing note and vitals reviewed. Constitutional: He appears well-developed and well-nourished.  HENT:  Head: Normocephalic and atraumatic.  Eyes: Conjunctivae are normal. Pupils are equal, round, and reactive to light.  Cardiovascular: Normal rate and regular rhythm.   Abdominal: Soft. Bowel sounds are normal.  Musculoskeletal: Normal range of motion. He exhibits tenderness.  bruising from fall  Skin: Skin is warm.          Assessment & Plan:  Stable DM  Last a1c was 7.5 and now cbgs are controlled HTN stable and bmet ordered reviewed diet and exercise goals

## 2013-01-01 ENCOUNTER — Other Ambulatory Visit: Payer: Self-pay | Admitting: Internal Medicine

## 2013-01-21 ENCOUNTER — Encounter: Payer: Self-pay | Admitting: Internal Medicine

## 2013-01-21 ENCOUNTER — Ambulatory Visit (INDEPENDENT_AMBULATORY_CARE_PROVIDER_SITE_OTHER): Payer: Medicare Other | Admitting: Internal Medicine

## 2013-01-21 VITALS — BP 140/90 | HR 72 | Temp 98.2°F | Resp 16 | Ht 72.0 in | Wt 212.0 lb

## 2013-01-21 DIAGNOSIS — E785 Hyperlipidemia, unspecified: Secondary | ICD-10-CM

## 2013-01-21 DIAGNOSIS — Z23 Encounter for immunization: Secondary | ICD-10-CM

## 2013-01-21 DIAGNOSIS — I10 Essential (primary) hypertension: Secondary | ICD-10-CM

## 2013-01-21 DIAGNOSIS — E119 Type 2 diabetes mellitus without complications: Secondary | ICD-10-CM

## 2013-01-21 LAB — COMPREHENSIVE METABOLIC PANEL
ALT: 36 U/L (ref 0–53)
AST: 23 U/L (ref 0–37)
Albumin: 4.2 g/dL (ref 3.5–5.2)
Alkaline Phosphatase: 57 U/L (ref 39–117)
BUN: 18 mg/dL (ref 6–23)
Calcium: 9.5 mg/dL (ref 8.4–10.5)
Glucose, Bld: 203 mg/dL — ABNORMAL HIGH (ref 70–99)
Potassium: 4.2 mEq/L (ref 3.5–5.1)
Sodium: 136 mEq/L (ref 135–145)
Total Bilirubin: 0.4 mg/dL (ref 0.3–1.2)

## 2013-01-21 LAB — HEMOGLOBIN A1C: Hgb A1c MFr Bld: 7.7 % — ABNORMAL HIGH (ref 4.6–6.5)

## 2013-01-21 MED ORDER — TELMISARTAN 40 MG PO TABS: 40.0000 mg | ORAL_TABLET | Freq: Every day | ORAL | Status: DC

## 2013-01-21 MED ORDER — AMLODIPINE BESYLATE 5 MG PO TABS: 5.0000 mg | ORAL_TABLET | Freq: Every day | ORAL | Status: DC

## 2013-01-21 MED ORDER — METFORMIN HCL 1000 MG PO TABS: 1000.0000 mg | ORAL_TABLET | Freq: Two times a day (BID) | ORAL | Status: DC

## 2013-01-21 MED ORDER — EZETIMIBE 10 MG PO TABS: ORAL_TABLET | ORAL | Status: DC

## 2013-01-21 NOTE — Progress Notes (Signed)
Subjective:    Patient ID: Walter [name], male    DOB: 12/01/1940, 72 y.o.   MRN: [id number]  HPI Follow up of DM Was on vacation and missed the DM medications Druggist gave him metformin  When he resumed the janumet developed soreness Off the [place] On 2000 of metformin Feels better and has better      Review of Systems  Constitutional: Negative for fever and fatigue.  HENT: Negative for hearing loss, congestion, neck pain and postnasal drip.   Eyes: Negative for discharge, redness and visual disturbance.  Respiratory: Negative for cough, shortness of breath and wheezing.   Cardiovascular: Negative for leg swelling.  Gastrointestinal: Negative for abdominal pain, constipation and abdominal distention.  Genitourinary: Negative for urgency and frequency.  Musculoskeletal: Negative for joint swelling and arthralgias.  Skin: Negative for color change and rash.  Neurological: Negative for weakness and light-headedness.  Hematological: Negative for adenopathy.  Psychiatric/Behavioral: Negative for behavioral problems.   Past Medical History  Diagnosis Date  . Hypertension   . Hyperlipidemia   . Diabetes mellitus   . History of shingles   . Colon cancer 1992    colon resection 1992  . Sleep apnea     cpap    History   Social History  . Marital Status: Married    Spouse Name: N/A    Number of Children: N/A  . Years of Education: N/A   Occupational History  . Not on file.   Social History Main Topics  . Smoking status: Never Smoker   . Smokeless tobacco: Never Used     Comment: smoked for less that 6 months in 1960  . Alcohol Use: 1.2 oz/week    2 Cans of beer per week  . Drug Use: No  . Sexual Activity: Not on file   Other Topics Concern  . Not on file   Social History Narrative  . No narrative on file    Past Surgical History  Procedure Laterality Date  . Colectomy  1992  . Appendectomy  1949    Family History  Problem Relation Age of Onset  . Cancer       colon/fhx  . Colon cancer Father 74  . Rectal cancer Maternal Uncle   . Colon cancer Paternal Grandmother 69    No Known Allergies  Current Outpatient Prescriptions on File Prior to Visit  Medication Sig Dispense Refill  . Loteprednol Etabonate (ALREX OP) Apply to eye daily. 1 drop daily to left eye      . ONE TOUCH ULTRA TEST test strip USE ONCE DAILY AS DIRECTED  100 each  11  . ZETIA 10 MG tablet TAKE 1 TABLET BY MOUTH ONCE A DAY  30 tablet  7   No current facility-administered medications on file prior to visit.    BP 140/90  Pulse 72  Temp(Src) 98.2 F (36.8 C)  Resp 16  Ht 6' (1.829 m)  Wt 212 lb (96.163 kg)  BMI 28.75 kg/m2       Objective:   Physical Exam  Constitutional: He appears well-developed and well-nourished.  HENT:  Head: Normocephalic and atraumatic.  Eyes: Conjunctivae are normal. Pupils are equal, round, and reactive to light.  Neck: Normal range of motion. Neck supple.  Cardiovascular: Normal rate and regular rhythm.   Pulmonary/Chest: Effort normal and breath sounds normal.  Abdominal: Soft. Bowel sounds are normal.          Assessment & Plan:  Patient developed myalgias with the Januvia  and we discontinued the medications on metformin alone his blood sugars have remained in good control in the myalgias have ceased.  If I think it is reasonable to discontinue the Januvia at this point continue the metformin 1000 mg twice daily measure hemoglobin A1c today and upper for that class of drugs if it is going to cause myalgias if he needs additional control of his diabetes we will consider going outside the class.  For blood pressure control due to cost we are going to get a generic amlodipine and generic Micardis which should create a effective

## 2013-01-21 NOTE — Addendum Note (Signed)
Addended by: Stacie Glaze on: 01/21/2013 10:18 AM   Modules accepted: Orders

## 2013-01-21 NOTE — Patient Instructions (Signed)
The patient is instructed to continue all medications as prescribed. Schedule followup with check out clerk upon leaving the clinic  

## 2013-03-13 ENCOUNTER — Encounter: Payer: Self-pay | Admitting: Family Medicine

## 2013-03-13 ENCOUNTER — Ambulatory Visit (INDEPENDENT_AMBULATORY_CARE_PROVIDER_SITE_OTHER): Payer: Medicare Other | Admitting: Family Medicine

## 2013-03-13 VITALS — BP 140/80 | HR 99 | Temp 98.3°F | Wt 206.0 lb

## 2013-03-13 DIAGNOSIS — J40 Bronchitis, not specified as acute or chronic: Secondary | ICD-10-CM

## 2013-03-13 MED ORDER — HYDROCODONE-HOMATROPINE 5-1.5 MG/5ML PO SYRP: 5.0000 mL | ORAL_SOLUTION | ORAL | Status: DC | PRN

## 2013-03-13 MED ORDER — AMOXICILLIN-POT CLAVULANATE 875-125 MG PO TABS: 1.0000 | ORAL_TABLET | Freq: Two times a day (BID) | ORAL | Status: DC

## 2013-03-13 MED ORDER — CEFTRIAXONE SODIUM 1 G IJ SOLR
1.0000 g | Freq: Once | INTRAMUSCULAR | Status: AC
  Administered 2013-03-13: 1 g via INTRAMUSCULAR

## 2013-03-13 NOTE — Progress Notes (Signed)
Pre visit review using our clinic review tool, if applicable. No additional management support is needed unless otherwise documented below in the visit note. 

## 2013-03-13 NOTE — Addendum Note (Signed)
Addended by: Aniceto Boss A on: 03/13/2013 12:27 PM   Modules accepted: Orders

## 2013-03-13 NOTE — Progress Notes (Signed)
  Subjective:    Patient ID: Emmitte Surgeon, male    DOB: Mar 06, 1941, 72 y.o.   MRN: [id number]  HPI Here for one week of chest tightness, SOB, and coughing up green sputum. No fever. Using Robitussin.    Review of Systems  Constitutional: Negative.   HENT: Negative.   Eyes: Negative.   Respiratory: Positive for cough, chest tightness, shortness of breath and wheezing.   Cardiovascular: Negative.        Objective:   Physical Exam  Constitutional: He appears well-developed and well-nourished.  HENT:  Right Ear: External ear normal.  Left Ear: External ear normal.  Nose: Nose normal.  Mouth/Throat: Oropharynx is clear and moist.  Eyes: Conjunctivae are normal.  Pulmonary/Chest: Effort normal. No respiratory distress. He has no rales.  Scattered rhonchi and wheezes   Lymphadenopathy:    He has no cervical adenopathy.          Assessment & Plan:  This is bronchitis which is bordering on pneumonia. Given Rocephin to be followed by Augmentin.

## 2013-08-05 ENCOUNTER — Other Ambulatory Visit (INDEPENDENT_AMBULATORY_CARE_PROVIDER_SITE_OTHER): Payer: Medicare Other

## 2013-08-05 DIAGNOSIS — E785 Hyperlipidemia, unspecified: Secondary | ICD-10-CM | POA: Diagnosis not present

## 2013-08-05 DIAGNOSIS — Z125 Encounter for screening for malignant neoplasm of prostate: Secondary | ICD-10-CM

## 2013-08-05 DIAGNOSIS — E119 Type 2 diabetes mellitus without complications: Secondary | ICD-10-CM | POA: Diagnosis not present

## 2013-08-05 DIAGNOSIS — I10 Essential (primary) hypertension: Secondary | ICD-10-CM | POA: Diagnosis not present

## 2013-08-05 DIAGNOSIS — Z0000 Encounter for general adult medical examination without abnormal findings: Secondary | ICD-10-CM

## 2013-08-05 LAB — MICROALBUMIN / CREATININE URINE RATIO
CREATININE, U: 217.5 mg/dL
MICROALB/CREAT RATIO: 1.6 mg/g (ref 0.0–30.0)
Microalb, Ur: 3.5 mg/dL — ABNORMAL HIGH (ref 0.0–1.9)

## 2013-08-05 LAB — BASIC METABOLIC PANEL
BUN: 13 mg/dL (ref 6–23)
CALCIUM: 9.3 mg/dL (ref 8.4–10.5)
CHLORIDE: 100 meq/L (ref 96–112)
CO2: 29 mEq/L (ref 19–32)
CREATININE: 1 mg/dL (ref 0.4–1.5)
GFR: 80.7 mL/min (ref >60.00)
Glucose, Bld: 256 mg/dL — ABNORMAL HIGH (ref 70–99)
Potassium: 5 mEq/L (ref 3.5–5.1)
Sodium: 139 mEq/L (ref 135–145)

## 2013-08-05 LAB — POCT URINALYSIS DIPSTICK
BILIRUBIN UA: NEGATIVE
Leukocytes, UA: NEGATIVE
NITRITE UA: NEGATIVE
RBC UA: NEGATIVE
Spec Grav, UA: >=1.03
Urobilinogen, UA: 0.2
pH, UA: 5

## 2013-08-05 LAB — HEMOGLOBIN A1C: Hgb A1c MFr Bld: 9.9 % — ABNORMAL HIGH (ref 4.6–6.5)

## 2013-08-05 LAB — LIPID PANEL
Cholesterol: 138 mg/dL (ref 0–200)
HDL: 40.7 mg/dL (ref >39.00)
LDL Cholesterol: 66 mg/dL (ref 0–99)
TRIGLYCERIDES: 156 mg/dL — AB (ref 0.0–149.0)
Total CHOL/HDL Ratio: 3
VLDL: 31.2 mg/dL (ref 0.0–40.0)

## 2013-08-05 LAB — CBC WITH DIFFERENTIAL/PLATELET
BASOS PCT: 0.5 % (ref 0.0–3.0)
Basophils Absolute: 0 10*3/uL (ref 0.0–0.1)
Eosinophils Absolute: 0.2 10*3/uL (ref 0.0–0.7)
Eosinophils Relative: 3.5 % (ref 0.0–5.0)
HCT: 41.7 % (ref 39.0–52.0)
Hemoglobin: 14.3 g/dL (ref 13.0–17.0)
LYMPHS PCT: 20.9 % (ref 12.0–46.0)
Lymphs Abs: 1.2 10*3/uL (ref 0.7–4.0)
MCHC: 34.3 g/dL (ref 30.0–36.0)
MCV: 92.1 fl (ref 78.0–100.0)
MONOS PCT: 8.5 % (ref 3.0–12.0)
Monocytes Absolute: 0.5 10*3/uL (ref 0.1–1.0)
NEUTROS PCT: 66.6 % (ref 43.0–77.0)
Neutro Abs: 3.9 10*3/uL (ref 1.4–7.7)
PLATELETS: 85 10*3/uL — AB (ref 150.0–400.0)
RBC: 4.53 Mil/uL (ref 4.22–5.81)
RDW: 13.4 % (ref 11.5–14.6)
WBC: 5.8 10*3/uL (ref 4.5–10.5)

## 2013-08-05 LAB — HEPATIC FUNCTION PANEL
ALK PHOS: 71 U/L (ref 39–117)
ALT: 58 U/L — AB (ref 0–53)
AST: 31 U/L (ref 0–37)
Albumin: 4 g/dL (ref 3.5–5.2)
BILIRUBIN TOTAL: 0.7 mg/dL (ref 0.3–1.2)
Bilirubin, Direct: 0.1 mg/dL (ref 0.0–0.3)
TOTAL PROTEIN: 6.7 g/dL (ref 6.0–8.3)

## 2013-08-05 LAB — TSH: TSH: 2.28 u[IU]/mL (ref 0.35–5.50)

## 2013-08-05 LAB — PSA: PSA: 2.6 ng/mL (ref 0.10–4.00)

## 2013-08-12 ENCOUNTER — Other Ambulatory Visit: Payer: Medicare Other

## 2013-08-19 ENCOUNTER — Ambulatory Visit (INDEPENDENT_AMBULATORY_CARE_PROVIDER_SITE_OTHER): Payer: Medicare Other | Admitting: Internal Medicine

## 2013-08-19 ENCOUNTER — Encounter: Payer: Self-pay | Admitting: Internal Medicine

## 2013-08-19 VITALS — BP 138/80 | HR 96 | Temp 97.9°F | Ht 72.0 in | Wt 214.0 lb

## 2013-08-19 DIAGNOSIS — N476 Balanoposthitis: Secondary | ICD-10-CM

## 2013-08-19 DIAGNOSIS — E1165 Type 2 diabetes mellitus with hyperglycemia: Secondary | ICD-10-CM

## 2013-08-19 DIAGNOSIS — Z23 Encounter for immunization: Secondary | ICD-10-CM

## 2013-08-19 DIAGNOSIS — N481 Balanitis: Secondary | ICD-10-CM

## 2013-08-19 DIAGNOSIS — Z0000 Encounter for general adult medical examination without abnormal findings: Secondary | ICD-10-CM

## 2013-08-19 DIAGNOSIS — IMO0002 Reserved for concepts with insufficient information to code with codable children: Secondary | ICD-10-CM

## 2013-08-19 DIAGNOSIS — E1169 Type 2 diabetes mellitus with other specified complication: Secondary | ICD-10-CM

## 2013-08-19 MED ORDER — FLUCONAZOLE 100 MG PO TABS: 100.0000 mg | ORAL_TABLET | Freq: Every day | ORAL | Status: DC

## 2013-08-19 MED ORDER — ALBIGLUTIDE 30 MG ~~LOC~~ PEN: 1.0000 "application " | PEN_INJECTOR | SUBCUTANEOUS | Status: DC

## 2013-08-19 MED ORDER — PNEUMOCOCCAL 13-VAL CONJ VACC IM SUSP: 0.5000 mL | INTRAMUSCULAR | Status: DC

## 2013-08-19 NOTE — Addendum Note (Signed)
Addended by: Monico Blitz T on: 08/19/2013 10:53 AM   Modules accepted: Orders

## 2013-08-19 NOTE — Progress Notes (Signed)
Subjective:    Patient ID: Walter [name], male    DOB: [birth year], 73 y.o.   MRN: [id number]  HPI Had a fall April 13 with significant hip bruising on the right Needs the prevnar Had severe bronchitis and was treated with prednisone and antibiotics   Review of Systems  Constitutional: Negative for fever and fatigue.  HENT: Negative for congestion, hearing loss and postnasal drip.   Eyes: Negative for discharge, redness and visual disturbance.  Respiratory: Negative for cough, shortness of breath and wheezing.   Cardiovascular: Negative for leg swelling.  Gastrointestinal: Negative for abdominal pain, constipation and abdominal distention.  Genitourinary: Negative for urgency and frequency.  Musculoskeletal: Negative for arthralgias, joint swelling and neck pain.  Skin: Negative for color change and rash.  Neurological: Negative for weakness and light-headedness.  Hematological: Negative for adenopathy.  Psychiatric/Behavioral: Negative for behavioral problems.   Past Medical History  Diagnosis Date  . Hypertension   . Hyperlipidemia   . Diabetes mellitus   . History of shingles   . Colon cancer 1992    colon resection 1992  . Sleep apnea     cpap    History   Social History  . Marital Status: Married    Spouse Name: N/A    Number of Children: N/A  . Years of Education: N/A   Occupational History  . Not on file.   Social History Main Topics  . Smoking status: Never Smoker   . Smokeless tobacco: Never Used     Comment: smoked for less that 6 months in 1960  . Alcohol Use: 1.2 oz/week    2 Cans of beer per week  . Drug Use: No  . Sexual Activity: Not on file   Other Topics Concern  . Not on file   Social History Narrative  . No narrative on file    Past Surgical History  Procedure Laterality Date  . Colectomy  1992  . Appendectomy  1949    Family History  Problem Relation Age of Onset  . Cancer      colon/fhx  . Colon cancer Father 85  . Rectal  cancer Maternal Uncle   . Colon cancer Paternal Grandmother 65    No Known Allergies  Current Outpatient Prescriptions on File Prior to Visit  Medication Sig Dispense Refill  . amLODipine (NORVASC) 5 MG tablet Take 1 tablet (5 mg total) by mouth daily.  90 tablet  3  . ezetimibe (ZETIA) 10 MG tablet TAKE 1 TABLET BY MOUTH ONCE A DAY  30 tablet  7  . Loteprednol Etabonate (ALREX OP) Apply to eye daily. 1 drop daily to left eye      . metFORMIN (GLUCOPHAGE) 1000 MG tablet Take 1 tablet (1,000 mg total) by mouth 2 (two) times daily with a meal.  180 tablet  3  . ONE TOUCH ULTRA TEST test strip USE ONCE DAILY AS DIRECTED  100 each  11  . telmisartan (MICARDIS) 40 MG tablet Take 1 tablet (40 mg total) by mouth daily.  90 tablet  3  . HYDROcodone-homatropine (HYDROMET) 5-1.5 MG/5ML syrup Take 5 mLs by mouth every 4 (four) hours as needed for cough.  240 mL  0   No current facility-administered medications on file prior to visit.    BP 138/80  Pulse 96  Temp(Src) 97.9 F (36.6 C) (Oral)  Ht 6' (1.829 m)  Wt 214 lb (97.07 kg)  BMI 29.02 kg/m2  SpO2 97%  Objective:   Physical Exam  Constitutional: He is oriented to person, place, and time. He appears well-developed and well-nourished.  HENT:  Head: Normocephalic and atraumatic.  Eyes: Conjunctivae are normal. Pupils are equal, round, and reactive to light.  Neck: Normal range of motion. Neck supple.  Cardiovascular: Normal rate and regular rhythm.   Pulmonary/Chest: Effort normal and breath sounds normal.  Abdominal: Soft. Bowel sounds are normal.  Musculoskeletal: Normal range of motion. He exhibits edema.  Neurological: He is alert and oriented to person, place, and time.  Skin: Skin is warm and dry.  Psychiatric: He has a normal mood and affect. His behavior is normal.          Assessment & Plan:  Patient presents for yearly preventative medicine examination. Medicare questionnaire was completed  All  immunizations and health maintenance protocols were reviewed with the patient and needed orders were placed.  Appropriate screening laboratory values were ordered for the patient including screening of hyperlipidemia, renal function and hepatic function. If indicated by BPH, a PSA was ordered.  Medication reconciliation,  past medical history, social history, problem list and allergies were reviewed in detail with the patient  Had been without metformin for 4 weeks  Resumed in march    Goals were established with regard to weight loss, exercise, and  diet in compliance with medications  End of life planning was discussed.  faithfull use of Cpap Stable problems

## 2013-08-19 NOTE — Progress Notes (Signed)
Pre visit review using our clinic review tool, if applicable. No additional management support is needed unless otherwise documented below in the visit note. 

## 2013-08-19 NOTE — Patient Instructions (Signed)
Start the injectable once a week

## 2013-08-19 NOTE — Addendum Note (Signed)
Addended by: Ricard Dillon on: 08/19/2013 09:42 AM   Modules accepted: Orders

## 2013-08-21 ENCOUNTER — Telehealth: Payer: Self-pay | Admitting: Internal Medicine

## 2013-08-21 NOTE — Telephone Encounter (Signed)
I received a denial for Tanzeum from [facility].  Pt must try and fail Byetta and Victoza or there are specific medical reason why the alternative medication(s) are not appropriate to treat the medical condition.

## 2013-08-21 NOTE — Telephone Encounter (Signed)
Pt following up on request for this med, pt states he has never tried anything else, so, whatever the doc thinks is ok w/ him!

## 2013-08-22 NOTE — Telephone Encounter (Signed)
He has enough for a month Did he try the card at his pharmacy locally for a year free

## 2013-08-23 ENCOUNTER — Telehealth: Payer: Self-pay | Admitting: Internal Medicine

## 2013-08-23 MED ORDER — LIRAGLUTIDE 18 MG/3ML ~~LOC~~ SOPN: PEN_INJECTOR | SUBCUTANEOUS | Status: DC

## 2013-08-23 NOTE — Telephone Encounter (Signed)
See previous note:  Per Dr. Arnoldo Morale pt was given is of no value; per pt the card states he does not qualify for this.  Pls advise on changes.

## 2013-08-23 NOTE — Telephone Encounter (Signed)
Pt returned your call would like a call back.  

## 2013-08-23 NOTE — Telephone Encounter (Signed)
Called and spoke with pt and pt is aware.  

## 2013-08-23 NOTE — Telephone Encounter (Signed)
Called and spoke with pt's wife and she will see if her husband has the card to take to the local pharmacy. Advised if she could not find the card to call back and I would see if I could locate another one.  Pt does have enough for 1 month.

## 2013-08-23 NOTE — Telephone Encounter (Signed)
Can change to victoza but the shot is daily  1.2 mg

## 2013-08-28 ENCOUNTER — Telehealth: Payer: Self-pay

## 2013-08-28 NOTE — Telephone Encounter (Signed)
Relevant patient education assigned to patient using Emmi. ° °

## 2013-10-21 ENCOUNTER — Other Ambulatory Visit (INDEPENDENT_AMBULATORY_CARE_PROVIDER_SITE_OTHER): Payer: Medicare Other

## 2013-10-21 DIAGNOSIS — E1169 Type 2 diabetes mellitus with other specified complication: Principal | ICD-10-CM

## 2013-10-21 DIAGNOSIS — IMO0002 Reserved for concepts with insufficient information to code with codable children: Secondary | ICD-10-CM

## 2013-10-21 DIAGNOSIS — E1165 Type 2 diabetes mellitus with hyperglycemia: Secondary | ICD-10-CM

## 2013-10-21 LAB — HEMOGLOBIN A1C: HEMOGLOBIN A1C: 7.4 % — AB (ref 4.6–6.5)

## 2013-10-25 ENCOUNTER — Other Ambulatory Visit: Payer: Self-pay | Admitting: Internal Medicine

## 2013-10-28 ENCOUNTER — Ambulatory Visit (INDEPENDENT_AMBULATORY_CARE_PROVIDER_SITE_OTHER): Payer: Medicare Other | Admitting: Physician Assistant

## 2013-10-28 ENCOUNTER — Telehealth: Payer: Self-pay

## 2013-10-28 ENCOUNTER — Encounter: Payer: Self-pay | Admitting: Physician Assistant

## 2013-10-28 VITALS — BP 110/68 | HR 84 | Temp 97.7°F | Resp 18 | Wt 202.0 lb

## 2013-10-28 DIAGNOSIS — E1169 Type 2 diabetes mellitus with other specified complication: Principal | ICD-10-CM

## 2013-10-28 DIAGNOSIS — E1165 Type 2 diabetes mellitus with hyperglycemia: Secondary | ICD-10-CM

## 2013-10-28 DIAGNOSIS — IMO0002 Reserved for concepts with insufficient information to code with codable children: Secondary | ICD-10-CM

## 2013-10-28 MED ORDER — GLUCOSE BLOOD VI STRP: ORAL_STRIP | Status: DC

## 2013-10-28 MED ORDER — ONETOUCH FINEPOINT LANCETS MISC: Status: DC

## 2013-10-28 NOTE — Patient Instructions (Signed)
We will schedule and A1c recheck for about 3 months from now.  After that lab draw, you will have an appointment scheduled with Dr. Yong Channel, so that he can reevaluate the necessity of new diabetes medications.  In the meantime, continue to diet and exercise and see if you're A1c continues to drop.  Follow up in about 3 months with new PCP to reassess, or sooner as needed.    Diabetes Mellitus and Food It is important for you to manage your blood sugar (glucose) level. Your blood glucose level can be greatly affected by what you eat. Eating healthier foods in the appropriate amounts throughout the day at about the same time each day will help you control your blood glucose level. It can also help slow or prevent worsening of your diabetes mellitus. Healthy eating may even help you improve the level of your blood pressure and reach or maintain a healthy weight.  HOW CAN FOOD AFFECT ME? Carbohydrates Carbohydrates affect your blood glucose level more than any other type of food. Your dietitian will help you determine how many carbohydrates to eat at each meal and teach you how to count carbohydrates. Counting carbohydrates is important to keep your blood glucose at a healthy level, especially if you are using insulin or taking certain medicines for diabetes mellitus. Alcohol Alcohol can cause sudden decreases in blood glucose (hypoglycemia), especially if you use insulin or take certain medicines for diabetes mellitus. Hypoglycemia can be a life-threatening condition. Symptoms of hypoglycemia (sleepiness, dizziness, and disorientation) are similar to symptoms of having too much alcohol.  If your health care Walter [name] has given you approval to drink alcohol, do so in moderation and use the following guidelines:  Women should not have more than one drink per day, and men should not have more than two drinks per day. One drink is equal to:  12 oz of beer.  5 oz of wine.  1 oz of hard liquor.  Do  not drink on an empty stomach.  Keep yourself hydrated. Have water, diet soda, or unsweetened iced tea.  Regular soda, juice, and other mixers might contain a lot of carbohydrates and should be counted. WHAT FOODS ARE NOT RECOMMENDED? As you make food choices, it is important to remember that all foods are not the same. Some foods have fewer nutrients per serving than other foods, even though they might have the same number of calories or carbohydrates. It is difficult to get your body what it needs when you eat foods with fewer nutrients. Examples of foods that you should avoid that are high in calories and carbohydrates but low in nutrients include:  Trans fats (most processed foods list trans fats on the Nutrition Facts label).  Regular soda.  Juice.  Candy.  Sweets, such as cake, pie, doughnuts, and cookies.  Fried foods. WHAT FOODS CAN I EAT? Have nutrient-rich foods, which will nourish your body and keep you healthy. The food you should eat also will depend on several factors, including:  The calories you need.  The medicines you take.  Your weight.  Your blood glucose level.  Your blood pressure level.  Your cholesterol level. You also should eat a variety of foods, including:  Protein, such as meat, poultry, fish, tofu, nuts, and seeds (lean animal proteins are best).  Fruits.  Vegetables.  Dairy products, such as milk, cheese, and yogurt (low fat is best).  Breads, grains, pasta, cereal, rice, and beans.  Fats such as olive oil, trans fat-free margarine,  canola oil, avocado, and olives. DOES EVERYONE WITH DIABETES MELLITUS HAVE THE SAME MEAL PLAN? Because every person with diabetes mellitus is different, there is not one meal plan that works for everyone. It is very important that you meet with a dietitian who will help you create a meal plan that is just right for you. Document Released: 01/13/2005 Document Revised: 04/23/2013 Document Reviewed:  03/15/2013 [facility] Patient Information 2015 [place], [place]. This information is not intended to replace advice given to you by your health care Walter [name]. Make sure you discuss any questions you have with your health care Walter [name].

## 2013-10-28 NOTE — Progress Notes (Signed)
Pre visit review using our clinic review tool, if applicable. No additional management support is needed unless otherwise documented below in the visit note. 

## 2013-10-28 NOTE — Telephone Encounter (Signed)
Error//acm

## 2013-10-28 NOTE — Progress Notes (Signed)
Subjective:    Patient ID: Walter [name], male    DOB: [birth year], 73 y.o.   MRN: [id number]  HPI Patient is 73 year old Caucasian male presenting to the clinic for followup of diabetes type 2. Patient was seen 9 weeks ago by primary care provider, and hemoglobin A1c was 9.9%. Patient was prescribed Victoza, and repeat hemoglobin A1c was scheduled. Patient presents today having had recent hemoglobin A1c 7.4% one week ago. Patient states that he has not started Victoza, as he wished to try diet and exercise modification on his own prior to this. He states that he has lost approximately 14 pounds over the past 9 weeks and has been eating a bland diet, limiting sugary foods, and exercising more. He wishes if possible to avoid starting Victoza at this time and to continue with diet and exercise. He is also currently taking metformin daily, he denies adverse effects to this medication. He is currently asymptomatic, and denies fevers, chills, nausea, vomiting, diarrhea, shortness of breath.   Review of Systems As per history of present illness and otherwise negative.   Past Medical History  Diagnosis Date  . Hypertension   . Hyperlipidemia   . Diabetes mellitus   . History of shingles   . Colon cancer 1992    colon resection 1992  . Sleep apnea     cpap    History   Social History  . Marital Status: Married    Spouse Name: N/A    Number of Children: N/A  . Years of Education: N/A   Occupational History  . Not on file.   Social History Main Topics  . Smoking status: Never Smoker   . Smokeless tobacco: Never Used     Comment: smoked for less that 6 months in 1960  . Alcohol Use: 1.2 oz/week    2 Cans of beer per week  . Drug Use: No  . Sexual Activity: Not on file   Other Topics Concern  . Not on file   Social History Narrative  . No narrative on file    Past Surgical History  Procedure Laterality Date  . Colectomy  1992  . Appendectomy  1949    Family History  Problem  Relation Age of Onset  . Cancer      colon/fhx  . Colon cancer Father 51  . Rectal cancer Maternal Uncle   . Colon cancer Paternal Grandmother 72    No Known Allergies  Current Outpatient Prescriptions on File Prior to Visit  Medication Sig Dispense Refill  . amLODipine (NORVASC) 5 MG tablet Take 1 tablet (5 mg total) by mouth daily.  90 tablet  3  . ezetimibe (ZETIA) 10 MG tablet TAKE 1 TABLET BY MOUTH ONCE A DAY  30 tablet  7  . Loteprednol Etabonate (ALREX OP) Apply to eye daily. 1 drop daily to left eye      . metFORMIN (GLUCOPHAGE) 1000 MG tablet Take 1 tablet (1,000 mg total) by mouth 2 (two) times daily with a meal.  180 tablet  3  . telmisartan (MICARDIS) 40 MG tablet Take 1 tablet (40 mg total) by mouth daily.  90 tablet  3  . Liraglutide (VICTOZA) 18 MG/3ML SOPN 1.2 mg daily.  15 mL  3   Current Facility-Administered Medications on File Prior to Visit  Medication Dose Route Frequency Provider Last Rate Last Dose  . pneumococcal 13-valent conjugate vaccine (PREVNAR 13) injection 0.5 mL  0.5 mL Intramuscular Tomorrow-1000 Ricard Dillon, MD  EXAM: BP 110/68  Pulse 84  Temp(Src) 97.7 F (36.5 C) (Oral)  Resp 18  Wt 202 lb (91.627 kg)     Objective:   Physical Exam  Nursing note and vitals reviewed. Constitutional: He is oriented to person, place, and time. He appears well-developed and well-nourished. No distress.  HENT:  Head: Normocephalic and atraumatic.  Eyes: Conjunctivae and EOM are normal. Pupils are equal, round, and reactive to light.  Neck: Normal range of motion.  Cardiovascular: Normal rate, regular rhythm and intact distal pulses.   Pulmonary/Chest: Effort normal and breath sounds normal. No respiratory distress. He exhibits no tenderness.  Musculoskeletal: Normal range of motion.  Neurological: He is alert and oriented to person, place, and time.  Skin: Skin is warm and dry. No rash noted. He is not diaphoretic. No erythema. No pallor.    Psychiatric: He has a normal mood and affect. His behavior is normal. Judgment and thought content normal.    Lab Results  Component Value Date   WBC 5.8 08/05/2013   HGB 14.3 08/05/2013   HCT 41.7 08/05/2013   PLT 85.0* 08/05/2013   GLUCOSE 256* 08/05/2013   CHOL 138 08/05/2013   TRIG 156.0* 08/05/2013   HDL 40.70 08/05/2013   LDLCALC 66 08/05/2013   ALT 58* 08/05/2013   AST 31 08/05/2013   NA 139 08/05/2013   K 5.0 08/05/2013   CL 100 08/05/2013   CREATININE 1.0 08/05/2013   BUN 13 08/05/2013   CO2 29 08/05/2013   TSH 2.28 08/05/2013   PSA 2.60 08/05/2013   HGBA1C 7.4* 10/21/2013   MICROALBUR 3.5* 08/05/2013       Assessment & Plan:  Walter [name] was seen today for follow-up.  Diagnoses and associated orders for this visit:  Type II or unspecified type diabetes mellitus with other specified manifestations, uncontrolled Comments: A1c down in just 9 weeks from 9.9 to 7.4 with diet and exercise alone. Pt wishes to continue without victoza for now. Will reassess in 3 months. - Hemoglobin A1c; Future  Other Orders - LIFESCAN FINEPOINT LANCETS MISC; Use as directed. - glucose blood (ONETOUCH VERIO) test strip; Test once daily.    Pt will establish with Dr. Yong Channel later this Summer. He is on the books to schedule an appointment when available.  Return precautions provided, and patient handout on Diabetes and food.  Plan to follow up in about 3 months to reassess, and otherwise as needed.  Patient Instructions  We will schedule and A1c recheck for about 3 months from now.  After that lab draw, you will have an appointment scheduled with Dr. Yong Channel, so that he can reevaluate the necessity of new diabetes medications.  In the meantime, continue to diet and exercise and see if you're A1c continues to drop.  Follow up in about 3 months with new PCP to reassess, or sooner as needed.

## 2014-01-07 ENCOUNTER — Other Ambulatory Visit: Payer: Self-pay | Admitting: Internal Medicine

## 2014-01-16 ENCOUNTER — Other Ambulatory Visit (INDEPENDENT_AMBULATORY_CARE_PROVIDER_SITE_OTHER): Payer: Medicare Other

## 2014-01-16 DIAGNOSIS — E1165 Type 2 diabetes mellitus with hyperglycemia: Secondary | ICD-10-CM

## 2014-01-16 DIAGNOSIS — IMO0002 Reserved for concepts with insufficient information to code with codable children: Secondary | ICD-10-CM

## 2014-01-16 DIAGNOSIS — E1169 Type 2 diabetes mellitus with other specified complication: Principal | ICD-10-CM

## 2014-01-16 LAB — HEMOGLOBIN A1C: Hgb A1c MFr Bld: 7.1 % — ABNORMAL HIGH (ref 4.6–6.5)

## 2014-01-20 ENCOUNTER — Other Ambulatory Visit: Payer: Medicare Other

## 2014-01-27 ENCOUNTER — Encounter: Payer: Self-pay | Admitting: Family Medicine

## 2014-01-27 ENCOUNTER — Ambulatory Visit (INDEPENDENT_AMBULATORY_CARE_PROVIDER_SITE_OTHER): Payer: Medicare Other | Admitting: Family Medicine

## 2014-01-27 VITALS — BP 122/70 | HR 80 | Temp 98.2°F | Wt 199.0 lb

## 2014-01-27 DIAGNOSIS — I10 Essential (primary) hypertension: Secondary | ICD-10-CM

## 2014-01-27 DIAGNOSIS — Z23 Encounter for immunization: Secondary | ICD-10-CM

## 2014-01-27 DIAGNOSIS — E119 Type 2 diabetes mellitus without complications: Secondary | ICD-10-CM

## 2014-01-27 DIAGNOSIS — E785 Hyperlipidemia, unspecified: Secondary | ICD-10-CM

## 2014-01-27 DIAGNOSIS — Z85038 Personal history of other malignant neoplasm of large intestine: Secondary | ICD-10-CM | POA: Insufficient documentation

## 2014-01-27 MED ORDER — ATORVASTATIN CALCIUM 20 MG PO TABS: ORAL_TABLET | ORAL | Status: DC

## 2014-01-27 NOTE — Assessment & Plan Note (Signed)
Well controlled on metformin alone at 2g. Patient recently cut to 1.5g daily. F/u in 3 months with repeat a1c. A1c to be checked before visit.

## 2014-01-27 NOTE — Assessment & Plan Note (Signed)
Well controlled on amlodipine 5mg  and telmisartan. Check cmet next visit

## 2014-01-27 NOTE — Assessment & Plan Note (Signed)
No mortality data from zetia. Change to atorvastatin 20mg  once a week. Check lipids 3 months.

## 2014-01-27 NOTE — Progress Notes (Signed)
[name], MD Phone: [phone]  Subjective:  Patient presents today to establish care with me as their new primary care provider. Patient was formerly a patient of Dr. Arnoldo Morale. Chief complaint-noted.   DIABETES Type II-reasonable control goal <7.5  Lab Results  Component Value Date   HGBA1C 7.1* 01/16/2014   HGBA1C 7.4* 10/21/2013   HGBA1C 9.9* 08/05/2013  Medications taking and tolerating-yes, metformin 1500mg  Blood Sugars per patient-fasting- 115-130 with occasional spikes Diet-raw oatmeal for breakfast, can of black beans at least once a day for lunch or dinner Regular Exercise-6 miles a day walking, up to 10 miles On Aspirin-yes On statin-no zetia Daily foot monitoring-yes Health Maintenance Due  Topic Date Due  . Foot Exam  02/09/2011  ROS- Denies Polyuria,Polydipsia, nocturia, Vision changes, feet or hand numbness/pain/tingling. Denies  Hypoglycemia symptoms (shaky, sweaty, hungry, weak anxious, tremor, palpitations, confusion, behavior change).   Hyperlipidemia-controlled on zetia but no mortality data  Lab Results  Component Value Date   LDLCALC 66 08/05/2013  On statin: no, on zetia Regular exercise: yes as above Diet: eating very well ROS- no chest pain or shortness of breath. No myalgias  Hypertension-well controlled  BP Readings from Last 3 Encounters:  01/27/14 122/70  10/28/13 110/68  08/19/13 138/80  Home BP monitoring-no Compliant with medications-yes without side effects ROS-Denies any CP, HA, SOB, blurry vision, LE edema.  The following were reviewed and entered/updated in epic: Past Medical History  Diagnosis Date  . Hypertension   . Hyperlipidemia   . Diabetes mellitus   . History of shingles   . Colon cancer 1992    colon resection 1992  . Sleep apnea     cpap   Patient Active Problem List   Diagnosis Date Noted  . DIABETES MELLITUS, TYPE II 04/23/2007    Priority: High  . BIGEMINY 04/21/2008    Priority: Medium  . HYPERLIPIDEMIA  04/23/2007    Priority: Medium  . SLEEP APNEA, OBSTRUCTIVE, MODERATE 04/23/2007    Priority: Medium  . HYPERTENSION 04/23/2007    Priority: Medium  . IRITIS 12/17/2008    Priority: Low  . 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 Colectomy  1992  . Appendectomy  1949    Family History  Problem Relation Age of Onset  . Cancer      colon/fhx  . Colon cancer Father 56  . Rectal cancer Maternal Uncle   . Colon cancer Paternal Grandmother 39    Medications- reviewed and updated Current Outpatient Prescriptions  Medication Sig Dispense Refill  . amLODipine (NORVASC) 5 MG tablet Take 1 tablet (5 mg total) by mouth daily.  90 tablet  3  . glucose blood (ONETOUCH VERIO) test strip Test once daily.  100 each  12  . LIFESCAN FINEPOINT LANCETS MISC Use as directed.  100 each  5  . metFORMIN (GLUCOPHAGE) 1000 MG tablet Take 1 tablet (1,000 mg total) by mouth 2 (two) times daily with a meal.  180 tablet  3  . telmisartan (MICARDIS) 40 MG tablet Take 1 tablet (40 mg total) by mouth daily.  90 tablet  3  . ZETIA 10 MG tablet TAKE 1 TABLET BY MOUTH ONCE DAILY  30 tablet  0  . Loteprednol Etabonate (ALREX OP) Apply to eye daily. 1 drop daily to left eye       No current facility-administered medications for this  visit.   Facility-Administered Medications Ordered in Other Visits  Medication Dose Route Frequency Provider Last Rate Last Dose  . pneumococcal 13-valent conjugate vaccine (PREVNAR 13) injection 0.5 mL  0.5 mL Intramuscular Tomorrow-1000 Ricard Dillon, MD        Allergies-reviewed and updated No Known Allergies  History   Social History  . Marital Status: Married    Spouse Name: N/A    Number of Children: N/A  . Years of Education: N/A   Social History Main Topics  . Smoking status: Never Smoker   . Smokeless tobacco: Never Used      Comment: smoked for less that 6 months in 1960  . Alcohol Use: 1.2 oz/week    2 Cans of beer per week  . Drug Use: No  . Sexual Activity: None   Other Topics Concern  . None   Social History Narrative  . None    ROS--See HPI   Objective: BP 122/70  Pulse 80  Temp(Src) 98.2 F (36.8 C)  Wt 199 lb (90.266 kg) Gen: NAD, resting comfortably, moves to table with ease HEENT: Mucous membranes are moist.  CV: RRR no murmurs rubs or gallops Lungs: CTAB no crackles, wheeze, rhonchi Abdomen: soft/nontender/nondistended/normal bowel sounds.   Ext: no edema Skin: warm, dry, no rash Neuro: grossly normal, moves all extremities, PERRLA DM foot exam normal   Assessment/Plan:  DIABETES MELLITUS, TYPE II Well controlled on metformin alone at 2g. Patient recently cut to 1.5g daily. F/u in 3 months with repeat a1c. A1c to be checked before visit.   HYPERLIPIDEMIA No mortality data from zetia. Change to atorvastatin 20mg  once a week. Check lipids 3 months.   HYPERTENSION Well controlled on amlodipine 5mg  and telmisartan. Check cmet next visit   Future fasting labs for 3 months Orders Placed This Encounter  Procedures  . Hemoglobin A1c    [place]    Standing Status: Future     Number of Occurrences:      Standing Expiration Date: 06/29/2014  . Lipid panel    [place]    Standing Status: Future     Number of Occurrences:      Standing Expiration Date: 06/29/2014    Order Specific Question:  Has the patient fasted?    Answer:  No  . TSH    [place]    Standing Status: Future     Number of Occurrences:      Standing Expiration Date: 06/29/2014  . Comprehensive metabolic panel    [place]    Standing Status: Future     Number of Occurrences:      Standing Expiration Date: 06/29/2014    Order Specific Question:  Has the patient fasted?    Answer:  No  . CBC    [place]    Standing Status: Future     Number of Occurrences:      Standing Expiration Date: 06/29/2014    Meds  ordered this encounter  Medications  . aspirin 81 MG tablet    Sig: Take 81 mg by mouth daily.  Marland Kitchen atorvastatin (LIPITOR) 20 MG tablet    Sig: Take 1 pill by mouth once per week    Dispense:  30 tablet    Refill:  3

## 2014-01-27 NOTE — Patient Instructions (Signed)
Very impressed by your hard work on diabetes. A1c of 7.1. Continue metformin 1500mg  and since this was a recent change let's check your a1c in 3 months.   For cholesterol, take atorvastatin once a week instead of zetia daily.   Blood pressure looks great

## 2014-01-30 ENCOUNTER — Other Ambulatory Visit: Payer: Self-pay | Admitting: Internal Medicine

## 2014-02-03 LAB — HM DIABETES EYE EXAM

## 2014-02-06 ENCOUNTER — Telehealth: Payer: Self-pay | Admitting: Family Medicine

## 2014-02-06 DIAGNOSIS — E118 Type 2 diabetes mellitus with unspecified complications: Secondary | ICD-10-CM

## 2014-02-06 NOTE — Telephone Encounter (Signed)
[id number] - [place], [place] - [address] [place]. AT [address] is requesting re-fill on metFORMIN (GLUCOPHAGE) 1000 MG tablet

## 2014-02-07 MED ORDER — METFORMIN HCL 1000 MG PO TABS: 1000.0000 mg | ORAL_TABLET | Freq: Two times a day (BID) | ORAL | Status: DC

## 2014-02-07 NOTE — Telephone Encounter (Signed)
Medication refilled

## 2014-03-24 ENCOUNTER — Telehealth: Payer: Self-pay | Admitting: Family Medicine

## 2014-03-24 NOTE — Telephone Encounter (Signed)
Pt said he is having some pain in his right foot where the toes meet the foot it self. He is asking if Dr Yong Channel can refer him to a foot doctor.

## 2014-03-24 NOTE — Telephone Encounter (Signed)
Is this ok Dr. Hunter? 

## 2014-03-24 NOTE — Telephone Encounter (Signed)
Tell patient I would be happy to see him to evaluate and determine most appropriate referral. If he opts not to do this can place podiatry referral.

## 2014-03-24 NOTE — Telephone Encounter (Signed)
Mrs Malachy Mood please see if pt will come in for an appointment per Dr. Ronney Lion message below.

## 2014-03-25 NOTE — Telephone Encounter (Signed)
Pt has sch appt for tomorrow

## 2014-03-26 ENCOUNTER — Ambulatory Visit (INDEPENDENT_AMBULATORY_CARE_PROVIDER_SITE_OTHER): Payer: Medicare Other | Admitting: Family Medicine

## 2014-03-26 ENCOUNTER — Encounter: Payer: Self-pay | Admitting: Family Medicine

## 2014-03-26 VITALS — BP 138/68 | Temp 98.1°F | Wt 205.0 lb

## 2014-03-26 DIAGNOSIS — M2011 Hallux valgus (acquired), right foot: Secondary | ICD-10-CM

## 2014-03-26 DIAGNOSIS — M79671 Pain in right foot: Secondary | ICD-10-CM

## 2014-03-26 HISTORY — DX: Hallux valgus (acquired), right foot: M20.11

## 2014-03-26 NOTE — Progress Notes (Signed)
Walter Reddish, MD Phone: [phone]  Subjective:   Walter [name] is a 73 y.o. year old very pleasant male patient who presents with the following:  Right foot pain Just past MTP joint on first 3 toes. At least 2 years. Long term has noted a low trend of his great toe turning into 2nd and 3rd toe. Has not tried anything-ibuprofen/tylenol or ice- other than thick insoles which tend to help. Thinner insoles make pain worse.  Walks 6 miles per day. Really enjoys hiking and this makes it difficult. Pain 6/10 aching pain worse with walking but also can hurt at rest.. Slowly worsening over time but also waxesand wanes.  ROS- mild redness at IP joint of great toe, no warmth or edema in foot.   Past Medical History- Patient Active Problem List   Diagnosis Date Noted  . DIABETES MELLITUS, TYPE II 04/23/2007    Priority: High  . HYPERLIPIDEMIA 04/23/2007    Priority: Medium  . SLEEP APNEA, OBSTRUCTIVE, MODERATE 04/23/2007    Priority: Medium  . HYPERTENSION 04/23/2007    Priority: Medium  . History of colon cancer 01/27/2014    Priority: Low  . IRITIS 12/17/2008    Priority: Low  . 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 BIGEMINY 04/21/2008    Priority: Low  . Hallux valgus of right foot 03/26/2014   Medications- reviewed and updated Current Outpatient Prescriptions  Medication Sig Dispense Refill  . amLODipine (NORVASC) 5 MG tablet TAKE 1 TABLET BY MOUTH ONCE DAILY 90 tablet 0  . aspirin 81 MG tablet Take 81 mg by mouth daily.    Marland Kitchen atorvastatin (LIPITOR) 20 MG tablet Take 1 pill by mouth once per week 30 tablet 3  . glucose blood (ONETOUCH VERIO) test strip Test once daily. 100 each 12  . LIFESCAN FINEPOINT LANCETS MISC Use as directed. 100 each 5  . Loteprednol Etabonate (ALREX OP) Apply to eye daily. 1 drop daily to left eye    . metFORMIN (GLUCOPHAGE) 1000 MG tablet Take 1 tablet (1,000 mg total) by  mouth 2 (two) times daily with a meal. 180 tablet 1  . telmisartan (MICARDIS) 40 MG tablet TAKE 1 TABLET BY MOUTH ONCE DAILY 90 tablet 0   No current facility-administered medications for this visit.   Facility-Administered Medications Ordered in Other Visits  Medication Dose Route Frequency Provider Last Rate Last Dose  . pneumococcal 13-valent conjugate vaccine (PREVNAR 13) injection 0.5 mL  0.5 mL Intramuscular Tomorrow-1000 Ricard Dillon, MD        Objective: BP 138/68 mmHg  Temp(Src) 98.1 F (36.7 C)  Wt 205 lb (92.987 kg) Gen: NAD, resting comfortably Ext: no edema Skin: warm, dry, healing ulceration on 2nd toe (patient states he picked at a callous on the tip of his toe until it bled and is now healing) CV: 2+ DP and PT pulses Neuro: intact distal sensation  Hallux Valgus noted on right great toe. Increased flexion of toes 2 and 3 as great toe pushes underneath them. There is some resulting hammer toeing of toes 2 and 3. Patient with tenderness at MTP joint and just distal on first 3 toes.       Assessment/Plan:  Hallux valgus of right foot This seems to be the primary culprit for the right foot pain-worsening hallux valgus of right foot causing hammering and compression on 2nd and 3rd toe. Refer to Dr. Oneida Alar of sports medicine  for discussion of nonsurgical management including orthotics. Patient wants to remain active and I think this is the ideal setting to work on this and I think nonsurgical approach is warranted before considering surgery.   Return precautions advised.   Orders Placed This Encounter  Procedures  . Ambulatory referral to Sports Medicine    Referral Priority:  Routine    Referral Type:  Consultation    Referral Reason:  Specialty Services Required    Referred to Provider:  Stefanie Libel, MD    Number of Visits Requested:  1

## 2014-03-26 NOTE — Assessment & Plan Note (Signed)
This seems to be the primary culprit for the right foot pain-worsening hallux valgus of right foot causing hammering and compression on 2nd and 3rd toe. Refer to Dr. Oneida Alar of sports medicine for discussion of nonsurgical management including orthotics. Patient wants to remain active and I think this is the ideal setting to work on this and I think nonsurgical approach is warranted before considering surgery.

## 2014-03-26 NOTE — Patient Instructions (Signed)
I want you to see Dr. Oneida Alar of sports medicine to discuss nonsurgical treatment of your bunions. He is great at working with people to keep them as active as possible (continuing your walking and hiking goals). This may take 1-2 months to get in (but possibly sooner). Just make sure when they call that your appointment is with Dr. Oneida Alar.

## 2014-04-05 LAB — HM DIABETES EYE EXAM

## 2014-04-11 ENCOUNTER — Ambulatory Visit (INDEPENDENT_AMBULATORY_CARE_PROVIDER_SITE_OTHER): Payer: Medicare Other | Admitting: Sports Medicine

## 2014-04-11 VITALS — BP 128/75 | HR 80 | Ht 72.0 in | Wt 199.0 lb

## 2014-04-11 DIAGNOSIS — M2011 Hallux valgus (acquired), right foot: Secondary | ICD-10-CM

## 2014-04-11 NOTE — Assessment & Plan Note (Signed)
He appears to have traumatically fractured a cystic area of the dorso medial spur of his bunion - proximal to joint line  See plan in OV

## 2014-04-11 NOTE — Progress Notes (Signed)
Patient ID: Walter [name], male   DOB: 1941/02/28, 73 y.o.   MRN: [id number]  Referral courtesy of Dr Yong Channel  Subjective: Patient is a 73 y/o M who presents for evaluation of right great toe pain.  He has chronic pain in feet along distal forefoot (plantar surface) which does not limit him; however, during a recent mountain hike he was in plantarflexion on right foot when this foot slipped out from under him posteriorly.  Since that time he has been dealing with significant pain at 1st MTP joint.  There is associated swelling without warmth or erythema.  He describes pain as achy.  Pain is worse with walking barefoot.  This great toe has been gradually moving into a more valgus position over the past 3-4 years causing significant callus on medial aspect of his second toe.  He has been wearing a brace on this toe which seems to take the pressure off this toe and helps his pain.  He has no numbness or tingling.  He has not used ice or NSAIDS.  He hikes for recreation and previously played soccer.  Of note, patient with type 2 DM and HTN which are both controlled.  He lost considerable weight >30 lbs with drastic improvement in Hgb A1c  Objective: BP 128/75 mmHg  Pulse 80  Ht 6' (1.829 m)  Wt 199 lb (90.266 kg)  BMI 26.98 kg/m2 Right Foot: Right Great toe flexion to 10 degrees, extension to 25 degrees; left great toe normal  Significant right hallux valgus Mildly tender to palpation over proximal aspect of right first MTP with soft tissue swelling Enlarged bony prominence of right first MTP  Mild hammering of right 2nd and 3rd toes Decreased ankle dorsiflexion  Normal anterior drawer; normal lateral ligaments  Black toenail on second toe from complication of callus removal Normal ankle strength to inversion, eversion, dorsiflexion, plantarflexion No loss of longitudinal arch bilateral feet Mild transverse arch  collapse on left foot No hindfoot valgus  0.5 cm leg length discrepancy (Left longer)  Ultrasound: significant prominence of 1st MTP joint with what appears to be spurring; joint is preserved;  Has cystic change within the spurring and this looks to have fractured with cortical disruption, calcification and some local swelling   Assessment/Plan:  Patient is a 73 y/o M with right great toe pain secondary to hallux valgus and bunion deformity.  1.  Hallux Valgus, Right 2.  Right Great Toe Pain 3.  Hallux Rigidus  Patient's hallux valgus and bunion may be secondary to repetitive trauma (prior soccer player), leg length discrepancy, and/or reduced ankle dorsiflexion.  It is likely that his recent forced extension injury caused a small chip off of this bunion as seen on ultrasound.  He had a first ray pad added to his shoe insole and will return for custom orthotics.  He was also given a separation pad to place between first and second toes and a bunion cushion.  He noticed immediate improvement is symptoms with these changes.  Return to clinic for custom orthotics.  Patient seen and discussed with Dr. Oneida Alar.  Creig Hines PGY-3 Family Medicine  Agree with this plan and I think we can give him good relief for hiking with better orthotic support.   Stefanie Libel, MD

## 2014-04-14 ENCOUNTER — Other Ambulatory Visit (INDEPENDENT_AMBULATORY_CARE_PROVIDER_SITE_OTHER): Payer: Medicare Other

## 2014-04-14 DIAGNOSIS — I10 Essential (primary) hypertension: Secondary | ICD-10-CM

## 2014-04-14 DIAGNOSIS — E119 Type 2 diabetes mellitus without complications: Secondary | ICD-10-CM

## 2014-04-14 DIAGNOSIS — E785 Hyperlipidemia, unspecified: Secondary | ICD-10-CM

## 2014-04-14 LAB — CBC
HEMATOCRIT: 43.4 % (ref 39.0–52.0)
HEMOGLOBIN: 14.6 g/dL (ref 13.0–17.0)
MCHC: 33.8 g/dL (ref 30.0–36.0)
MCV: 91.8 fl (ref 78.0–100.0)
Platelets: 147 10*3/uL — ABNORMAL LOW (ref 150.0–400.0)
RBC: 4.72 Mil/uL (ref 4.22–5.81)
RDW: 13.7 % (ref 11.5–15.5)
WBC: 6.4 10*3/uL (ref 4.0–10.5)

## 2014-04-14 LAB — LIPID PANEL
CHOLESTEROL: 140 mg/dL (ref 0–200)
HDL: 35.1 mg/dL — ABNORMAL LOW (ref >39.00)
LDL CALC: 85 mg/dL (ref 0–99)
NONHDL: 104.9
Total CHOL/HDL Ratio: 4
Triglycerides: 99 mg/dL (ref 0.0–149.0)
VLDL: 19.8 mg/dL (ref 0.0–40.0)

## 2014-04-14 LAB — COMPREHENSIVE METABOLIC PANEL
ALT: 28 U/L (ref 0–53)
AST: 19 U/L (ref 0–37)
Albumin: 4.4 g/dL (ref 3.5–5.2)
Alkaline Phosphatase: 60 U/L (ref 39–117)
BILIRUBIN TOTAL: 0.7 mg/dL (ref 0.2–1.2)
BUN: 22 mg/dL (ref 6–23)
CO2: 29 meq/L (ref 19–32)
Calcium: 9.6 mg/dL (ref 8.4–10.5)
Chloride: 103 mEq/L (ref 96–112)
Creatinine, Ser: 0.9 mg/dL (ref 0.4–1.5)
GFR: 85.62 mL/min (ref >60.00)
Glucose, Bld: 177 mg/dL — ABNORMAL HIGH (ref 70–99)
Potassium: 4.7 mEq/L (ref 3.5–5.1)
Sodium: 139 mEq/L (ref 135–145)
TOTAL PROTEIN: 7.1 g/dL (ref 6.0–8.3)

## 2014-04-14 LAB — HEMOGLOBIN A1C: Hgb A1c MFr Bld: 7.6 % — ABNORMAL HIGH (ref 4.6–6.5)

## 2014-04-14 LAB — TSH: TSH: 1.49 u[IU]/mL (ref 0.35–4.50)

## 2014-04-21 ENCOUNTER — Encounter: Payer: Self-pay | Admitting: Family Medicine

## 2014-04-21 ENCOUNTER — Ambulatory Visit (INDEPENDENT_AMBULATORY_CARE_PROVIDER_SITE_OTHER): Payer: Medicare Other | Admitting: Family Medicine

## 2014-04-21 VITALS — BP 132/74 | HR 72 | Temp 98.1°F | Ht 72.0 in | Wt 204.0 lb

## 2014-04-21 DIAGNOSIS — IMO0002 Reserved for concepts with insufficient information to code with codable children: Secondary | ICD-10-CM

## 2014-04-21 DIAGNOSIS — E1165 Type 2 diabetes mellitus with hyperglycemia: Secondary | ICD-10-CM

## 2014-04-21 MED ORDER — GLUCOSE BLOOD VI STRP: ORAL_STRIP | Status: DC

## 2014-04-21 MED ORDER — AMLODIPINE BESYLATE 5 MG PO TABS: 5.0000 mg | ORAL_TABLET | Freq: Every day | ORAL | Status: DC

## 2014-04-21 MED ORDER — METFORMIN HCL 1000 MG PO TABS: 1000.0000 mg | ORAL_TABLET | Freq: Two times a day (BID) | ORAL | Status: DC

## 2014-04-21 MED ORDER — TELMISARTAN 40 MG PO TABS: 40.0000 mg | ORAL_TABLET | Freq: Every day | ORAL | Status: DC

## 2014-04-21 MED ORDER — ATORVASTATIN CALCIUM 20 MG PO TABS: ORAL_TABLET | ORAL | Status: DC

## 2014-04-21 MED ORDER — ONETOUCH FINEPOINT LANCETS MISC: Status: DC

## 2014-04-21 NOTE — Progress Notes (Signed)
[name], MD Phone: [phone]  Subjective:   Walter [name] is a 73 y.o. year old very pleasant male patient who presents with the following:  DIABETES Type II-mild poor control  Lab Results  Component Value Date   HGBA1C 7.6* 04/14/2014   HGBA1C 7.1* 01/16/2014   HGBA1C 7.4* 10/21/2013   Medications taking and tolerating-yes, increased metformin to 1g twice a day Blood Sugars per patient-was in 150s and now down into 130s Diet-eats small amount of sweets when he partakes Regular Exercise-up to 3-6 miles with insoles given by Dr. Oneida Alar. He is thrilled he can walk without pain.  On Aspirin-yes On statin-yes Daily foot monitoring-yes  ROS- Denies Vision changes, feet or hand numbness/pain/tingling. Denies Hypoglycemia symptoms (shaky, sweaty, hungry, weak anxious, tremor, palpitations, confusion, behavior change).   Past Medical History- Patient Active Problem List   Diagnosis Date Noted  . Diabetes mellitus type II, uncontrolled 04/23/2007    Priority: High  . HYPERLIPIDEMIA 04/23/2007    Priority: Medium  . SLEEP APNEA, OBSTRUCTIVE, MODERATE 04/23/2007    Priority: Medium  . HYPERTENSION 04/23/2007    Priority: Medium  . History of colon cancer 01/27/2014    Priority: Low  . IRITIS 12/17/2008    Priority: Low  . 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 BIGEMINY 04/21/2008    Priority: Low  . Hallux valgus of right foot 03/26/2014   Medications- reviewed and updated Current Outpatient Prescriptions  Medication Sig Dispense Refill  . amLODipine (NORVASC) 5 MG tablet Take 1 tablet (5 mg total) by mouth daily. 90 tablet 3  . aspirin 81 MG tablet Take 81 mg by mouth daily.    Marland Kitchen atorvastatin (LIPITOR) 20 MG tablet Take 1 pill by mouth once per week 12 tablet 3  . glucose blood (ONETOUCH VERIO) test strip Test once daily. 100 each 12  . LIFESCAN FINEPOINT LANCETS MISC Once daily 100  each 5  . metFORMIN (GLUCOPHAGE) 1000 MG tablet Take 1 tablet (1,000 mg total) by mouth 2 (two) times daily with a meal. 180 tablet 3  . telmisartan (MICARDIS) 40 MG tablet Take 1 tablet (40 mg total) by mouth daily. 90 tablet 3  . vitamin E 400 UNIT capsule Take 400 Units by mouth daily.     No current facility-administered medications for this visit.   Facility-Administered Medications Ordered in Other Visits  Medication Dose Route Frequency Provider Last Rate Last Dose  . pneumococcal 13-valent conjugate vaccine (PREVNAR 13) injection 0.5 mL  0.5 mL Intramuscular Tomorrow-1000 Ricard Dillon, MD        Objective: BP 132/74 mmHg  Pulse 72  Temp(Src) 98.1 F (36.7 C) (Oral)  Ht 6' (1.829 m)  Wt 204 lb (92.534 kg)  BMI 27.66 kg/m2 Gen: NAD, resting comfortably CV: RRR no murmurs rubs or gallops Lungs: CTAB no crackles, wheeze, rhonchi Abdomen: soft/nontender/nondistended/normal bowel sounds.  Ext: no edema Skin: warm, dry, no rash  Neuro: grossly normal, moves all extremities   Assessment/Plan:  Diabetes mellitus type II, uncontrolled  metformin 1.5 g--> 2g daily given a1c 7.6. Patient is back to his regular walking up to 3-6 miles per day. Hopeful we will be able to come down on metformin to former 1.5 g at follow up. Would want a1c < 6.8 to do so.   Return precautions advised. 3-4 month follow up.   Orders Placed This Encounter  Procedures  . Hemoglobin A1c  Standing Status: Future     Number of Occurrences:      Standing Expiration Date: 04/22/2015  . HM DIABETES EYE EXAM    This external order was created through the Results Console.    Meds ordered this encounter  Medications  . amLODipine (NORVASC) 5 MG tablet    Sig: Take 1 tablet (5 mg total) by mouth daily.    Dispense:  90 tablet    Refill:  3  . atorvastatin (LIPITOR) 20 MG tablet    Sig: Take 1 pill by mouth once per week    Dispense:  12 tablet    Refill:  3  . glucose blood (ONETOUCH VERIO) test  strip    Sig: Test once daily.    Dispense:  100 each    Refill:  12  . LIFESCAN FINEPOINT LANCETS MISC    Sig: Once daily    Dispense:  100 each    Refill:  5  . metFORMIN (GLUCOPHAGE) 1000 MG tablet    Sig: Take 1 tablet (1,000 mg total) by mouth 2 (two) times daily with a meal.    Dispense:  180 tablet    Refill:  3  . telmisartan (MICARDIS) 40 MG tablet    Sig: Take 1 tablet (40 mg total) by mouth daily.    Dispense:  90 tablet    Refill:  3

## 2014-04-21 NOTE — Patient Instructions (Signed)
Thrilled with your progress with foot pain and as a result-walking!   A1c up a little bit but I bet you will be back down in 4 months. Hopeful we can go back to the metformin 1000mg  in AM and 500mg  in PM and continue to cut back as your walking gets back to normal.   Blood pressure looks great.

## 2014-04-21 NOTE — Progress Notes (Signed)
Pre visit review using our clinic review tool, if applicable. No additional management support is needed unless otherwise documented below in the visit note. 

## 2014-04-21 NOTE — Assessment & Plan Note (Signed)
metformin 1.5 g--> 2g daily given a1c 7.6. Patient is back to his regular walking up to 3-6 miles per day. Hopeful we will be able to come down on metformin to former 1.5 g at follow up. Would want a1c < 6.8 to do so.

## 2014-04-21 NOTE — Addendum Note (Signed)
Addended by: [name] on: 04/21/2014 06:22 PM   Modules accepted: Orders

## 2014-05-07 ENCOUNTER — Encounter: Payer: Self-pay | Admitting: Family Medicine

## 2014-05-20 ENCOUNTER — Ambulatory Visit (INDEPENDENT_AMBULATORY_CARE_PROVIDER_SITE_OTHER): Payer: 59 | Admitting: Sports Medicine

## 2014-05-20 ENCOUNTER — Encounter: Payer: Self-pay | Admitting: Sports Medicine

## 2014-05-20 VITALS — BP 135/80 | Ht 72.0 in | Wt 199.0 lb

## 2014-05-20 DIAGNOSIS — M2011 Hallux valgus (acquired), right foot: Secondary | ICD-10-CM

## 2014-05-20 NOTE — Assessment & Plan Note (Signed)
Dramatic improvement in sxs and walking  We will provide infromation about buying additional sports insoles and using first ray pads

## 2014-05-20 NOTE — Progress Notes (Signed)
   Subjective:    Patient ID: Walter [name], male    DOB: 10-28-1940, 74 y.o.   MRN: [id number]  HPI Walter [name] is a 74 yo male with a history of hallux valgus and hallux rigidus who presents to [facility] for follow-up. He was last seen in [facility] on 04/11/14. At that time he had been experiencing chronic pain in his feet along the distal forefoot, with acute worsening at the 1st MTP joint following a hyperextension injury while hiking. Ultrasound examination showed traumatic fracture of a cystic area of the dorso medial spur of his bunion - proximal to joint line. Since the last visit, he has been wearing his inserts with first ray padding on the right foot. He reports significant improvement in his symptoms as a result. He has returned to walking 6 miles per day without pain. He has also been wearing a bunion splint at night, which he reports has significantly improved his pain in the morning upon waking. At this time, he feels like the inserts with right first ray pad are working very well.    Review of Systems Negative other than noted in HPI.     Objective:   Physical Exam Vitals: BP 135/80 General: well-appearing, pleasant male in no acute distress  Right foot/ankle:  Inspection: hallux valgus deformity with about 45 degrees of valgus tilt; mild erythema of first MTP joint; hammer toe deformity of the DIP joint of the second and third toes; collapse of the longitudinal and transverse arches Palpation: no tenderness to palpation over the MTP joints, midfoot, medial or lateral malleoli, or calcaneous ROM: diminished ROM of great toe, full ROM with flexion and extension of remaining toes; full ROM with flexion and extension of the ankle, as well as inversion and eversion Strength: 5/5 with dorsiflexion, plantarflexion, inversion and eversion of ankle and of flexion and extension of toes Special: negative anterior drawer and talar tilt Neurovascular: neurovascularly intact distally  Left foot/ankle:    Inspection: no deformity of toes or discoloration; collapse of longitudinal and transverse arches  Palpation: no tenderness to palpation over the MTP joints, midfoot, medial or lateral malleoli, or calcaneous ROM: full ROM with flexion and extension of all toes; full ROM with flexion and extension of the ankle, as well as inversion and eversion Strength: 5/5 with dorsiflexion, plantarflexion, inversion and eversion of ankle and of flexion and extension of toes Special: negative anterior drawer and talar tilt Neurovascular: neurovascularly intact distally     Assessment & Plan:   1. Right foot pain with hallux valgus deformity:  Previous first ray pad of insert coupled with bunion splinting at night seems to have significantly improved [name]'s symptoms. He seems to have recovered from his traumatic fracture of the dorso medial spur of his bunion. Will delay making custom orthotics at this time.   - Provided pt with pair of green sports insoles with first ray post under the right great toe - F/U as needed or if foot pain returns and desires custom orthotics   This patient was seen with and note was dictated by Crissie Sickles, MS4.   I agree with this assessment.  Very nice response to posting and bunion accessories.  Will follow as needed.  Ysidro Evert

## 2014-06-25 ENCOUNTER — Encounter: Payer: Self-pay | Admitting: Family Medicine

## 2014-08-25 ENCOUNTER — Other Ambulatory Visit (INDEPENDENT_AMBULATORY_CARE_PROVIDER_SITE_OTHER): Payer: Medicare Other

## 2014-08-25 DIAGNOSIS — E1165 Type 2 diabetes mellitus with hyperglycemia: Secondary | ICD-10-CM

## 2014-08-25 DIAGNOSIS — IMO0002 Reserved for concepts with insufficient information to code with codable children: Secondary | ICD-10-CM

## 2014-08-25 LAB — HEMOGLOBIN A1C: Hgb A1c MFr Bld: 7.7 % — ABNORMAL HIGH (ref 4.6–6.5)

## 2014-08-28 ENCOUNTER — Encounter: Payer: Self-pay | Admitting: Family Medicine

## 2014-08-28 ENCOUNTER — Ambulatory Visit (INDEPENDENT_AMBULATORY_CARE_PROVIDER_SITE_OTHER): Payer: Medicare Other | Admitting: Family Medicine

## 2014-08-28 VITALS — BP 122/74 | HR 84 | Temp 98.4°F | Wt 208.0 lb

## 2014-08-28 DIAGNOSIS — I10 Essential (primary) hypertension: Secondary | ICD-10-CM

## 2014-08-28 DIAGNOSIS — G4733 Obstructive sleep apnea (adult) (pediatric): Secondary | ICD-10-CM

## 2014-08-28 DIAGNOSIS — E785 Hyperlipidemia, unspecified: Secondary | ICD-10-CM | POA: Diagnosis not present

## 2014-08-28 DIAGNOSIS — IMO0002 Reserved for concepts with insufficient information to code with codable children: Secondary | ICD-10-CM

## 2014-08-28 DIAGNOSIS — E1165 Type 2 diabetes mellitus with hyperglycemia: Secondary | ICD-10-CM | POA: Diagnosis not present

## 2014-08-28 MED ORDER — SITAGLIPTIN PHOS-METFORMIN HCL 50-1000 MG PO TABS: 1.0000 | ORAL_TABLET | Freq: Two times a day (BID) | ORAL | Status: DC

## 2014-08-28 MED ORDER — GLIMEPIRIDE 2 MG PO TABS: 2.0000 mg | ORAL_TABLET | Freq: Every day | ORAL | Status: DC

## 2014-08-28 NOTE — Progress Notes (Signed)
[name], MD Phone: [phone]  Subjective:   Walter [name] is a 74 y.o. year old very pleasant male patient who presents with the following:  DIABETES Type II-mild poor control  Lab Results  Component Value Date   HGBA1C 7.7* 08/25/2014   HGBA1C 7.6* 04/14/2014   HGBA1C 7.1* 01/16/2014   Medications taking and tolerating-yes, metformin 1g BID Lethargic and dizzy on janumet in past Blood Sugars per patient-fasting- usually above 150 Diet-eats very reasonablly  Regular Exercise-walking 3-6 miles per day On Aspirin-yes On statin-y Daily foot monitoring-yes  ROS- Denies Vision changes, feet or hand numbness/pain/tingling. Denies Hypoglycemia symptoms (shaky, sweaty, hungry, weak anxious, tremor, palpitations, confusion, behavior change).   Hyperlipidemia-controlled  Lab Results  Component Value Date   LDLCALC 85 04/14/2014   On statin: yes Regular exercise: y Diet: reasonable ROS- no chest pain or shortness of breath. No myalgias  Hypertension-controlled  BP Readings from Last 3 Encounters:  08/28/14 122/74  05/20/14 135/80  04/21/14 132/74   Home BP monitoring-n Compliant with medications-yes without side effects ROS-Denies any CP, HA, SOB, blurry vision, LE edema, transient weakness, orthopnea, PND.   Past Medical History- Patient Active Problem List   Diagnosis Date Noted  . Diabetes mellitus type II, uncontrolled 04/23/2007    Priority: High  . Hyperlipidemia 04/23/2007    Priority: Medium  . Obstructive sleep apnea 04/23/2007    Priority: Medium  . Essential hypertension 04/23/2007    Priority: Medium  . Hallux valgus of right foot 03/26/2014    Priority: Low  . History of colon cancer 01/27/2014    Priority: Low  . IRITIS 12/17/2008    Priority: Low  . 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 BIGEMINY 04/21/2008    Priority: Low   Medications- reviewed and  updated Current Outpatient Prescriptions  Medication Sig Dispense Refill  . amLODipine (NORVASC) 5 MG tablet Take 1 tablet (5 mg total) by mouth daily. 90 tablet 3  . aspirin 81 MG tablet Take 81 mg by mouth daily.    Marland Kitchen atorvastatin (LIPITOR) 20 MG tablet Take 1 pill by mouth once per week 12 tablet 3  . glucose blood (ONETOUCH VERIO) test strip Test once daily. 100 each 12  . LIFESCAN FINEPOINT LANCETS MISC Once daily 100 each 5  . metFORMIN (GLUCOPHAGE) 1000 MG tablet Take 1 tablet (1,000 mg total) by mouth 2 (two) times daily with a meal. 180 tablet 3  . telmisartan (MICARDIS) 40 MG tablet Take 1 tablet (40 mg total) by mouth daily. 90 tablet 3  . vitamin E 400 UNIT capsule Take 400 Units by mouth daily.     Objective: BP 122/74 mmHg  Pulse 84  Temp(Src) 98.4 F (36.9 C)  Wt 208 lb (94.348 kg) Gen: NAD, resting comfortably CV: RRR no murmurs rubs or gallops Lungs: CTAB no crackles, wheeze, rhonchi Abdomen: soft/nontender/nondistended/normal bowel sounds. No rebound or guarding.  Ext: no edema Skin: warm, dry, no rash Neuro: grossly normal, moves all extremities   Assessment/Plan:  Diabetes mellitus type II, uncontrolled  Metformin 1g BID with poor control and a1c 7.7. SE on januvia reported after visit. Never took [place]. Add amaryl 2mg  and follow up 4 months with a1c before visit. Continue healthy eating/exercise and discussed weight loss need.    Hyperlipidemia Great control on once weekly atorvastatin-continue   Essential hypertension Excellent control on Amlodipine 5mg , telmisartan 40mg -continue   Obstructive sleep apnea Continue  cpap   4 mo.   Orders Placed This Encounter  Procedures  . Hemoglobin A1c    [place]    Standing Status: Future     Number of Occurrences:      Standing Expiration Date: 08/28/2015   Meds ordered this encounter  Medications  . sitaGLIPtin-metformin (JANUMET) 50-1000 MG per tablet    Sig: Take 1 tablet by mouth 2 (two) times  daily with a meal.    Dispense:  180 tablet    Refill:  3  later discontinued after mychart message and added amaryl to metformin instead due to cost and prior SE

## 2014-08-28 NOTE — Assessment & Plan Note (Signed)
Continue cpap.  

## 2014-08-28 NOTE — Assessment & Plan Note (Signed)
Excellent control on Amlodipine 5mg , telmisartan 40mg -continue

## 2014-08-28 NOTE — Assessment & Plan Note (Signed)
Metformin 1g BID with poor control and a1c 7.7. SE on januvia reported after visit. Never took [place]. Add amaryl 2mg  and follow up 4 months with a1c before visit. Continue healthy eating/exercise and discussed weight loss need.

## 2014-08-28 NOTE — Patient Instructions (Signed)
Change to janumet 50-1000mg  from metformin 1000mg  twice a day.   See me in 4 months with a1c a few days before  Keep up the walking  Work on lowering the stress level-that can definitely contribute to high sugars

## 2014-08-28 NOTE — Assessment & Plan Note (Signed)
Great control on once weekly atorvastatin-continue

## 2014-09-05 ENCOUNTER — Encounter: Payer: Self-pay | Admitting: Gastroenterology

## 2014-09-16 ENCOUNTER — Ambulatory Visit: Payer: Medicare Other | Admitting: Family Medicine

## 2014-09-23 ENCOUNTER — Telehealth: Payer: Self-pay | Admitting: Gastroenterology

## 2014-09-23 NOTE — Telephone Encounter (Signed)
I spoke with the patient and he states that he misspoke he has a hemorrhoid not a fissure.  He is advised that if he has an internal hemorrhoid it can be injected at the time of the colonoscopy, but can't treat an external hemorrhoid.  He is advised that if it is bothering him and it can't be injected we can refer him to a Psychologist, sport and exercise.

## 2014-10-01 ENCOUNTER — Encounter: Payer: Self-pay | Admitting: Gastroenterology

## 2014-10-30 ENCOUNTER — Ambulatory Visit (AMBULATORY_SURGERY_CENTER): Payer: Self-pay

## 2014-10-30 VITALS — Ht 72.0 in | Wt 213.0 lb

## 2014-10-30 DIAGNOSIS — Z85038 Personal history of other malignant neoplasm of large intestine: Secondary | ICD-10-CM

## 2014-10-30 DIAGNOSIS — Z86010 Personal history of colonic polyps: Secondary | ICD-10-CM

## 2014-10-30 MED ORDER — NA SULFATE-K SULFATE-MG SULF 17.5-3.13-1.6 GM/177ML PO SOLN
1.0000 | Freq: Once | ORAL | Status: DC
Start: 2014-10-30 — End: 2014-11-13

## 2014-10-30 NOTE — Progress Notes (Signed)
Not on home 02 No allergy to eggs or soy No diet drugs No anesthesia complications

## 2014-11-13 ENCOUNTER — Ambulatory Visit (AMBULATORY_SURGERY_CENTER): Payer: Medicare Other | Admitting: Gastroenterology

## 2014-11-13 ENCOUNTER — Encounter: Payer: Self-pay | Admitting: Gastroenterology

## 2014-11-13 VITALS — BP 126/78 | HR 64 | Temp 97.9°F | Resp 19 | Ht 72.0 in | Wt 213.0 lb

## 2014-11-13 DIAGNOSIS — Z85038 Personal history of other malignant neoplasm of large intestine: Secondary | ICD-10-CM | POA: Diagnosis present

## 2014-11-13 DIAGNOSIS — Z86010 Personal history of colonic polyps: Secondary | ICD-10-CM

## 2014-11-13 MED ORDER — SODIUM CHLORIDE 0.9 % IV SOLN: 500.0000 mL | INTRAVENOUS | Status: DC

## 2014-11-13 NOTE — Progress Notes (Signed)
Pt. Given levsin0.25mg  for gas.  Did not pass any air.  Denies abd. Pain.  Abd. Soft.

## 2014-11-13 NOTE — Patient Instructions (Signed)
YOU HAD AN ENDOSCOPIC PROCEDURE TODAY AT [facility]:   Refer to the procedure report that was given to you for any specific questions about what was found during the examination.  If the procedure report does not answer your questions, please call your gastroenterologist to clarify.  If you requested that your care partner not be given the details of your procedure findings, then the procedure report has been included in a sealed envelope for you to review at your convenience later.  YOU SHOULD EXPECT: Some feelings of bloating in the abdomen. Passage of more gas than usual.  Walking can help get rid of the air that was put into your GI tract during the procedure and reduce the bloating. If you had a lower endoscopy (such as a colonoscopy or flexible sigmoidoscopy) you may notice spotting of blood in your stool or on the toilet paper. If you underwent a bowel prep for your procedure, you may not have a normal bowel movement for a few days.  Please Note:  You might notice some irritation and congestion in your nose or some drainage.  This is from the oxygen used during your procedure.  There is no need for concern and it should clear up in a day or so.  SYMPTOMS TO REPORT IMMEDIATELY:   Following lower endoscopy (colonoscopy or flexible sigmoidoscopy):  Excessive amounts of blood in the stool  Significant tenderness or worsening of abdominal pains  Swelling of the abdomen that is new, acute  Fever of 100F or higher   Black, tarry-looking stools For urgent or emergent issues, a gastroenterologist can be reached at any hour by calling [phone].   DIET: Your first meal following the procedure should be a small meal and then it is ok to progress to your normal diet. Heavy or fried foods are harder to digest and may make you feel nauseous or bloated.  Likewise, meals heavy in dairy and vegetables can increase bloating.  Drink plenty of fluids but you should avoid alcoholic  beverages for 24 hours.  ACTIVITY:  You should plan to take it easy for the rest of today and you should NOT DRIVE or use heavy machinery until tomorrow (because of the sedation medicines used during the test).    FOLLOW UP: Our staff will call the number listed on your records the next business day following your procedure to check on you and address any questions or concerns that you may have regarding the information given to you following your procedure. If we do not reach you, we will leave a message.  However, if you are feeling well and you are not experiencing any problems, there is no need to return our call.  We will assume that you have returned to your regular daily activities without incident.  If any biopsies were taken you will be contacted by phone or by letter within the next 1-3 weeks.  Please call us at [phone] if you have not heard about the biopsies in 3 weeks.    SIGNATURES/CONFIDENTIALITY: You and/or your care partner have signed paperwork which will be entered into your electronic medical record.  These signatures attest to the fact that that the information above on your After Visit Summary has been reviewed and is understood.  Full responsibility of the confidentiality of this discharge information lies with you and/or your care-partner.

## 2014-11-13 NOTE — Op Note (Signed)
[place]  [address] [place], [id number]   COLONOSCOPY PROCEDURE REPORT  PATIENT: Walter [name], Walter [name]  MR#: [id number] BIRTHDATE: 1941-03-04 , 31  yrs. old GENDER: male ENDOSCOPIST: Ladene Artist, MD, [facility] PROCEDURE DATE:  11/13/2014 PROCEDURE:   Colonoscopy, surveillance First Screening Colonoscopy - Avg.  risk and is 50 yrs.  old or older - No.  Prior Negative Screening - Now for repeat screening. N/A  History of Adenoma - Now for follow-up colonoscopy & has been > or = to 3 yrs.  Yes hx of adenoma.  Has been 3 or more years since last colonoscopy.  Polyps removed today? No Recommend repeat exam, <10 yrs? Yes high risk ASA CLASS:   Class III INDICATIONS:Screening for colonic neoplasia, PH Colon or Rectal Adenocarcinoma, and PH Colon Adenoma. MEDICATIONS: Monitored anesthesia care and Propofol 170 mg IV DESCRIPTION OF PROCEDURE:   After the risks benefits and alternatives of the procedure were thoroughly explained, informed consent was obtained.  The digital rectal exam revealed no abnormalities of the rectum.   The LB [id number]  endoscope was introduced through the anus and advanced to the cecum, which was identified by both the appendix and ileocecal valve. No adverse events experienced.   The quality of the prep was (Suprep was used) good.  The instrument was then slowly withdrawn as the colon was fully examined. Estimated blood loss is zero unless otherwise noted in this procedure report.    COLON FINDINGS: There was mild diverticulosis noted in the descending colon.   There was evidence of a prior colo-colonic surgical anastomosis in the sigmoid colon.   The examination was otherwise normal.  Retroflexed views revealed internal Grade I hemorrhoids and hypertrophied anal papillae. The time to cecum = 2.1 Withdrawal time = 11.1   The scope was withdrawn and the procedure completed. COMPLICATIONS: There were no immediate complications.  ENDOSCOPIC  IMPRESSION: 1.   Mild diverticulosis in the descending colon 2.   Prior colo-colonic surgical anastomosis in the sigmoid colon 3.   Grade l internal hemorrhoids and hypertrophied anal papillae  RECOMMENDATIONS: 1.  High fiber diet with liberal fluid intake. 2.  Repeat Colonoscopy in 5 years.  eSigned:  Ladene Artist, MD, [facility] 11/13/2014 11:53 AM

## 2014-11-13 NOTE — Progress Notes (Signed)
Report to PACU, RN, vss, BBS= Clear.  

## 2014-11-14 ENCOUNTER — Telehealth: Payer: Self-pay | Admitting: *Deleted

## 2014-11-14 NOTE — Telephone Encounter (Signed)
No answer. Name identifier. Message left to call if questions or concerns. 

## 2014-11-21 ENCOUNTER — Other Ambulatory Visit: Payer: Self-pay

## 2014-11-21 MED ORDER — GLIMEPIRIDE 2 MG PO TABS: 2.0000 mg | ORAL_TABLET | Freq: Every day | ORAL | Status: DC

## 2014-11-21 NOTE — Telephone Encounter (Signed)
Rx sent to pharmacy for glimepiride 2 mg tablet #90.

## 2014-12-22 ENCOUNTER — Other Ambulatory Visit (INDEPENDENT_AMBULATORY_CARE_PROVIDER_SITE_OTHER): Payer: Medicare Other

## 2014-12-22 DIAGNOSIS — IMO0002 Reserved for concepts with insufficient information to code with codable children: Secondary | ICD-10-CM

## 2014-12-22 DIAGNOSIS — E1165 Type 2 diabetes mellitus with hyperglycemia: Secondary | ICD-10-CM

## 2014-12-22 LAB — HEMOGLOBIN A1C: HEMOGLOBIN A1C: 7.8 % — AB (ref 4.6–6.5)

## 2014-12-26 ENCOUNTER — Encounter: Payer: Self-pay | Admitting: Family Medicine

## 2014-12-26 ENCOUNTER — Ambulatory Visit (INDEPENDENT_AMBULATORY_CARE_PROVIDER_SITE_OTHER): Payer: Medicare Other | Admitting: Family Medicine

## 2014-12-26 VITALS — BP 138/70 | HR 80 | Temp 98.1°F | Wt 217.0 lb

## 2014-12-26 DIAGNOSIS — I10 Essential (primary) hypertension: Secondary | ICD-10-CM

## 2014-12-26 DIAGNOSIS — G4733 Obstructive sleep apnea (adult) (pediatric): Secondary | ICD-10-CM

## 2014-12-26 DIAGNOSIS — E1165 Type 2 diabetes mellitus with hyperglycemia: Secondary | ICD-10-CM | POA: Diagnosis not present

## 2014-12-26 DIAGNOSIS — IMO0002 Reserved for concepts with insufficient information to code with codable children: Secondary | ICD-10-CM

## 2014-12-26 DIAGNOSIS — E785 Hyperlipidemia, unspecified: Secondary | ICD-10-CM

## 2014-12-26 DIAGNOSIS — Z23 Encounter for immunization: Secondary | ICD-10-CM | POA: Diagnosis not present

## 2014-12-26 DIAGNOSIS — K629 Disease of anus and rectum, unspecified: Secondary | ICD-10-CM

## 2014-12-26 NOTE — Patient Instructions (Signed)
Medication Instructions:  No changes  Other Instructions:  Ordered cpap- reach out to [facility]  We will call you within a week about your referral to general surgery to look at skin lesion near rectum, suspect better to resect this than band it. If you do not hear within 2 weeks, give [place] a call.   Great job walking!   Labwork: Before your next visit but on 04/16/15 or later  Testing/Procedures/Immunizations: Flu shot today  Follow-Up (all visit scheduling, rescheduling, cancellations including labs should be scheduled at front desk): 4 months

## 2014-12-26 NOTE — Progress Notes (Signed)
[name], MD  Subjective:  Walter [name] is a 74 y.o. year old very pleasant male patient who presents with:  See problem oriented charting ROS- no hypoglycemia, blurry vision, no chest pain or shrotness of breath  Past Medical History-  DM, OSA, HLD, HTN, history shingles, history colon cancer- now out to 5 year colonoscopies  Medications- reviewed and updated Current Outpatient Prescriptions  Medication Sig Dispense Refill  . amLODipine (NORVASC) 5 MG tablet Take 1 tablet (5 mg total) by mouth daily. 90 tablet 3  . aspirin 81 MG tablet Take 81 mg by mouth daily.    Marland Kitchen atorvastatin (LIPITOR) 20 MG tablet Take 1 pill by mouth once per week 12 tablet 3  . CHROMIUM PICOLINATE PO Take 200 mg by mouth daily.    Marland Kitchen CINNAMON PO Take 200 mg by mouth.    Marland Kitchen glimepiride (AMARYL) 2 MG tablet Take 1 tablet (2 mg total) by mouth daily before breakfast. 90 tablet 1  . glucose blood (ONETOUCH VERIO) test strip Test once daily. 100 each 12  . LIFESCAN FINEPOINT LANCETS MISC Once daily 100 each 5  . Lutein 20 MG CAPS Take 20 mg by mouth daily.    . Magnesium 200 MG TABS Take 200 mg by mouth daily.    . metFORMIN (GLUCOPHAGE) 1000 MG tablet Take 1,000 mg by mouth 2 (two) times daily with a meal.    . Multiple Vitamins-Minerals (CENTRUM SILVER PO) Take by mouth daily.    . Omega-3 Fatty Acids (FISH OIL PO) Take 1,000 mg by mouth daily.    . saw palmetto 160 MG capsule Take 160 mg by mouth 2 (two) times daily.    Marland Kitchen telmisartan (MICARDIS) 40 MG tablet Take 1 tablet (40 mg total) by mouth daily. 90 tablet 3  . vitamin E 400 UNIT capsule Take 400 Units by mouth daily.     Objective: BP 138/70 mmHg  Pulse 80  Temp(Src) 98.1 F (36.7 C)  Wt 217 lb (98.431 kg) Gen: NAD, resting comfortably CV: RRR no murmurs rubs or gallops Lungs: CTAB no crackles, wheeze, rhonchi Abdomen: soft/nontender/nondistended/normal bowel sounds. No rebound or guarding.  Rectal: lesion protrudes at least a cm, almost like  prolonged skin tag, nontender. Not clear if this is a hemorrhoid or not. Instead of sending for banding, send to general surgery for eval and potential resection.  Ext: trace edema Skin: warm, dry Neuro: grossly normal, moves all extremities    Assessment/Plan:  Diabetes mellitus type II, uncontrolled S: poorly controlled. a1c up 0.1. Patient has been taking amaryl and metformin but admits to increased hunger/poor discipline on food. Exercise has been great still. Weight was up  10 lbs but has lost 4.  Lab Results  Component Value Date   HGBA1C 7.8* 12/22/2014  A/P:Continue current meds. We agreed 4 more month trial, if a1c >7.5 boost amaryl next visit  Obstructive sleep apnea S: poorly controlled. Increased daytime sleepiness. Discovered that old unit not functioning at titration within last 2 weeks. Patient is compliant with CPAP and benefits when unit is functioning.  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 Considered GI refer for banding but think surgical referral to consider resection would be higher yield.    Return precautions advised.  Orders Placed This Encounter  Procedures  . For home use only DME continuous positive airway pressure (CPAP)    [facility] Attention Darlina Guys    Order Specific Question:  Patient has OSA or probable OSA    Answer:  Yes    Order Specific Question:  Is the patient currently using CPAP in the home    Answer:  Yes    Order Specific Question:  If yes (to question two)    Answer:  Determine DME provider and inform them of any new orders/settings    Order Specific Question:  Date of face to face encounter    Answer:  12/26/14    Order Specific Question:  Settings    Answer:  5-10     Order Specific Question:  Signs and symptoms of probable OSA  (select all that apply)    Answer:  Snoring    Order Specific Question:  CPAP supplies needed    Answer:  Mask, headgear, cushions, filters, heated tubing and water chamber  . Flu Vaccine QUAD 36+ mos IM  . CBC    [place]    Standing Status: Future     Number of Occurrences:      Standing Expiration Date: 12/26/2015  . Comprehensive metabolic panel    [place]    Standing Status: Future     Number of Occurrences:      Standing Expiration Date: 12/26/2015    Order Specific Question:  Has the patient fasted?    Answer:  No  . Lipid panel    [place]    Standing Status: Future     Number of Occurrences:      Standing Expiration Date: 12/26/2015    Order Specific Question:  Has the patient fasted?    Answer:  No  . Hemoglobin A1c    [place]    Standing Status: Future     Number of Occurrences:      Standing Expiration Date: 12/26/2015  . Ambulatory referral to General Surgery    Referral Priority:  Routine    Referral Type:  Surgical    Referral Reason:  Specialty Services Required    Requested Specialty:  General Surgery    Number of Visits Requested:  1

## 2014-12-26 NOTE — Assessment & Plan Note (Signed)
S: poorly controlled. Increased daytime sleepiness. Discovered that old unit not functioning at titration within last 2 weeks. Patient is compliant with CPAP and benefits when unit is functioning.  A/P: sent in rx for new cpap to [facility] Darlina Guys

## 2014-12-26 NOTE — Assessment & Plan Note (Signed)
S: poorly controlled. a1c up 0.1. Patient has been taking amaryl and metformin but admits to increased hunger/poor discipline on food. Exercise has been great still. Weight was up  10 lbs but has lost 4.  Lab Results  Component Value Date   HGBA1C 7.8* 12/22/2014  A/P:Continue current meds. We agreed 4 more month trial, if a1c >7.5 boost amaryl next visit

## 2015-01-09 ENCOUNTER — Encounter: Payer: Self-pay | Admitting: Family Medicine

## 2015-02-09 LAB — HM DIABETES EYE EXAM

## 2015-02-13 ENCOUNTER — Other Ambulatory Visit: Payer: Self-pay | Admitting: Surgery

## 2015-02-13 NOTE — H&P (Signed)
Heinrich Fertig 02/13/2015 8:46 AM Location: [facility] Patient #: [id number] DOB: [birth year] Married / Language: Cleophus Molt / Race: White Male History of Present Illness Adin Hector MD; 02/13/2015 9:24 AM) Patient words: rectal lesion.  The patient is a 74 year old male who presents with anal lesions. Patient sent for surgical consultation by his primary care physician Dr. [name]. Perianal mass. Possible atypical hemorrhoid.  Pleasant patient with history of diverticulosis. Prior sigmoid colectomy in the past. History of anal papilla hypertrophied noted on colonoscopies by Dr. Lucio Edward. Noted abnormal mass near her anus. Surgical consultation requested. Patient usually has bowel movement every day. Not on a fiber supplement. Distant history of colon cancer status post open sigmoid colectomy when he lived [place] in [place]. Recalls 40 years ago a popping at his anus after hiking [place]. Flares of irritation since. Now I feel something popping out all the time. Colonoscopy 2 months ago noted probable prolapsing anal papilla. No Crohn's. Also colitis. No C. difficile. No fevers chills or sweats. He's never had any ROMs with fissures or fistulas or incision and drainage. He's never had any hemorrhoid treatments such as injection banding or surgery. Because of the worsening problems, surgical consultation requested. He is rather active. Can walk several miles a day without difficulty. Does not smoke. No cardiac issues. Other Problems Mammie Lorenzo, LPN; 04/54/0981 1:91 AM) Colon Cancer Diabetes Mellitus Hemorrhoids High blood pressure Hypercholesterolemia  Past Surgical History Mammie Lorenzo, LPN; 47/82/9562 1:30 AM) Appendectomy Vasectomy  Diagnostic Studies History Mammie Lorenzo, LPN; 86/57/8469 6:29 AM) Colonoscopy within last year  Allergies Mammie Lorenzo, LPN; 52/84/1324 4:01 AM) No Known Drug Allergies  02/13/2015  Medication History Mammie Lorenzo, LPN; 02/72/5366 4:40 AM) AmLODIPine Besylate (5MG  Tablet, Oral) Active. Atorvastatin Calcium (20MG  Tablet, Oral) Active. Glimepiride (2MG  Tablet, Oral) Active. MetFORMIN HCl (1000MG  Tablet, Oral) Active. Telmisartan (40MG  Tablet, Oral) Active. Aspirin (81MG  Tablet DR, Oral) Active. Chromium Picolate (1000MCG Tablet, Oral) Active. Cinnamon (500MG  Capsule, Oral) Active. Lutein (20MG  Tablet, Oral) Active. Magnesium (200MG  Tablet, Oral) Active. Multivitamins (Oral) Active. Fish Oil Concentrate (1000MG  Capsule, Oral) Active. Saw Palmetto (Serenoa repens) (160MG  Capsule, Oral) Active. Vitamin E (400UNIT Tablet, Oral) Active. OneTouch Ultra Blue (In Vitro) Active. Medications Reconciled  Social History Mammie Lorenzo, LPN; 34/74/2595 6:38 AM) Alcohol use Moderate alcohol use. Caffeine use Coffee. No drug use Tobacco use Former smoker.  Family History Mammie Lorenzo, LPN; 75/64/3329 5:18 AM) Colon Cancer Father. Diabetes Mellitus Father. Hypertension Mother.     Review of Systems Claiborne Billings Dockery LPN; 84/16/6063 0:16 AM) General Not Present- Appetite Loss, Chills, Fatigue, Fever, Night Sweats, Weight Gain and Weight Loss. Skin Not Present- Change in Wart/Mole, Dryness, Hives, Jaundice, New Lesions, Non-Healing Wounds, Rash and Ulcer. HEENT Not Present- Earache, Hearing Loss, Hoarseness, Nose Bleed, Oral Ulcers, Ringing in the Ears, Seasonal Allergies, Sinus Pain, Sore Throat, Visual Disturbances, Wears glasses/contact lenses and Yellow Eyes. Respiratory Present- Snoring. Not Present- Bloody sputum, Chronic Cough, Difficulty Breathing and Wheezing. Breast Not Present- Breast Mass, Breast Pain, Nipple Discharge and Skin Changes. Cardiovascular Not Present- Chest Pain, Difficulty Breathing Lying Down, Leg Cramps, Palpitations, Rapid Heart Rate, Shortness of Breath and Swelling of Extremities. Gastrointestinal Present-  Hemorrhoids. Not Present- Abdominal Pain, Bloating, Bloody Stool, Change in Bowel Habits, Chronic diarrhea, Constipation, Difficulty Swallowing, Excessive gas, Gets full quickly at meals, Indigestion, Nausea, Rectal Pain and Vomiting. Male Genitourinary Not Present- Blood in Urine, Change in Urinary Stream, Frequency, Impotence, Nocturia, Painful Urination, Urgency and Urine Leakage.  Vitals Claiborne Billings Dockery LPN; 02/26/2535 6:44 AM) 02/13/2015 8:47 AM Weight: 211.6 lb Height: 72in Body Surface Area: 2.21 m Body Mass Index: 28.7 kg/m Temp.: 98.43F(Oral)  Pulse: 74 (Regular)  BP: 124/78 (Sitting, Left Arm, Standard)     Physical Exam Adin Hector MD; 02/13/2015 9:21 AM)  General Mental Status-Alert. General Appearance-Not in acute distress, Not Sickly. Orientation-Oriented X3. Hydration-Well hydrated. Voice-Normal.  Integumentary Global Assessment Upon inspection and palpation of skin surfaces of the - Axillae: non-tender, no inflammation or ulceration, no drainage. and Distribution of scalp and body hair is normal. General Characteristics Temperature - normal warmth is noted.  Head and Neck Head-normocephalic, atraumatic with no lesions or palpable masses. Face Global Assessment - atraumatic, no absence of expression. Neck Global Assessment - no abnormal movements, no bruit auscultated on the right, no bruit auscultated on the left, no decreased range of motion, non-tender. Trachea-midline. Thyroid Gland Characteristics - non-tender.  Eye Eyeball - Left-Extraocular movements intact, No Nystagmus. Eyeball - Right-Extraocular movements intact, No Nystagmus. Cornea - Left-No Hazy. Cornea - Right-No Hazy. Sclera/Conjunctiva - Left-No scleral icterus, No Discharge. Sclera/Conjunctiva - Right-No scleral icterus, No Discharge. Pupil - Left-Direct reaction to light normal. Pupil - Right-Direct reaction to light  normal.  ENMT Ears Pinna - Left - no drainage observed, no generalized tenderness observed. Right - no drainage observed, no generalized tenderness observed. Nose and Sinuses External Inspection of the Nose - no destructive lesion observed. Inspection of the nares - Left - quiet respiration. Right - quiet respiration. Mouth and Throat Lips - Upper Lip - no fissures observed, no pallor noted. Lower Lip - no fissures observed, no pallor noted. Nasopharynx - no discharge present. Oral Cavity/Oropharynx - Tongue - no dryness observed. Oral Mucosa - no cyanosis observed. Hypopharynx - no evidence of airway distress observed.  Chest and Lung Exam Inspection Movements - Normal and Symmetrical. Accessory muscles - No use of accessory muscles in breathing. Palpation Palpation of the chest reveals - Non-tender. Auscultation Breath sounds - Normal and Clear.  Cardiovascular Auscultation Rhythm - Regular. Murmurs & Other Heart Sounds - Auscultation of the heart reveals - No Murmurs and No Systolic Clicks.  Abdomen Inspection Inspection of the abdomen reveals - No Visible peristalsis and No Abnormal pulsations. Umbilicus - No Bleeding, No Urine drainage. Palpation/Percussion Palpation and Percussion of the abdomen reveal - Soft, Non Tender, No Rebound tenderness, No Rigidity (guarding) and No Cutaneous hyperesthesia. Note: Moderate diastases recti. Small reducible umbilical hernia. Notable on Valsalva only.   Male Genitourinary Sexual Maturity Tanner 5 - Adult hair pattern and Adult penile size and shape. Note: Normal external male genitalia. No inguinal hernias.   Rectal Note: Scar on sacrum consistent with prior pilonidal excision. Minimal. His. Prolapsed anal papilla/hemorrhoid. No fissure. No fissure. Normal sphincter tone. Scope he notes easily prolapsing large RIGHT posterior anal papilla. RIGHT anterior anal papilla enlarged and almost prolapsing out as well. LEFT lateral pile not  so inflamed. Grade 2 internal hemorrhoids.   Peripheral Vascular Upper Extremity Inspection - Left - No Cyanotic nailbeds, Not Ischemic. Right - No Cyanotic nailbeds, Not Ischemic.  Neurologic Neurologic evaluation reveals -normal attention span and ability to concentrate, able to name objects and repeat phrases. Appropriate fund of knowledge , normal sensation and normal coordination. Mental Status Affect - not angry, not paranoid. Cranial Nerves-Normal Bilaterally. Gait-Normal.  Neuropsychiatric Mental status exam performed with findings of-able to articulate well with normal speech/language, rate, volume and coherence, thought content normal with ability to perform basic computations  and apply abstract reasoning and no evidence of hallucinations, delusions, obsessions or homicidal/suicidal ideation.  Musculoskeletal Global Assessment Spine, Ribs and Pelvis - no instability, subluxation or laxity. Right Upper Extremity - no instability, subluxation or laxity.  Lymphatic Head & Neck  General Head & Neck Lymphatics: Bilateral - Description - No Localized lymphadenopathy. Axillary  General Axillary Region: Bilateral - Description - No Localized lymphadenopathy. Femoral & Inguinal  Generalized Femoral & Inguinal Lymphatics: Left - Description - No Localized lymphadenopathy. Right - Description - No Localized lymphadenopathy.   Results Adin Hector MD; 02/13/2015 9:25 AM) Procedures  Name Value Date ANOSCOPY, DIAGNOSTIC [phone]) [ Hemorrhoids ] Procedure Anal exam: Skin tag Internal exam: Internal Hemorroids ( non-bleeding) prolapse Other: Please see rectal examination section. Prolapsing internal hemorrhoids/anal papilla. RIGHT posterior greater than RIGHT anterior. Grade 2 internal hemorrhoids otherwise.  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 Again would benefit from removal given prolapsing and difficulties with leakage and hygiene. Some discomfort as well.  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 We are worsening and bothersome, I offered removal. Would do with outpatient surgery given the location and number. He agrees. We will work to coordinate a convenient time.  Current Plans ANOSCOPY, DIAGNOSTIC ([id number]) You are being scheduled for surgery - Our schedulers will call you.  You should hear from our office's scheduling department within 5 working days about the location, date, and time of surgery. We try to make accommodations for patient's preferences in scheduling surgery, but sometimes the OR schedule or the surgeon's schedule prevents [place] from making those accommodations.  If you have not heard from our office [phone]) in 5 working days, call the office and ask for your surgeon's nurse.  If you have other questions about your diagnosis, plan, or surgery, call the office and ask for your surgeon's nurse.   The anatomy & physiology of the anorectal region was discussed. The pathophysiology of hemorrhoids and differential diagnosis was discussed. Natural history risks without surgery was discussed. I stressed the importance of a bowel regimen to have daily soft bowel movements to minimize progression of disease. Interventions such as sclerotherapy & banding were discussed.  The patient's symptoms are not adequately controlled by medicines and other non-operative treatments. I feel the risks & problems of no surgery outweigh the operative risks; therefore, I recommended surgery to treat the hemorrhoids by ligation, pexy, and possible resection.  Risks such as bleeding, infection, urinary difficulties, need for  further treatment, heart attack, death, and other risks were discussed. I noted a good likelihood this will help address the problem. Goals of post-operative recovery were discussed as well. Possibility that this will not correct all symptoms was explained. Post-operative pain, bleeding, constipation, and other problems after surgery were discussed. We will work to minimize complications. Educational handouts further explaining the pathology, treatment options, and bowel regimen were given as well. Questions were answered. The patient expresses understanding & wishes to proceed with surgery. Pt Education - Pamphlet Given - The Hemorrhoid Book: discussed with patient and provided information. Pt Education - [facility] Hemorrhoids (Sione Baumgarten): discussed with patient and provided information. Pt Education - [facility] Rectal Prep for Anorectal outpatient/office surgery: discussed with patient and provided information. Started Anusol-HC 2.5% Cream twice daily, as needed.  Adin Hector, M.D.,  F.A.C.S. Gastrointestinal and [facility]imally Invasive [facility] [address], [place],  [address] [phone] Main / Paging

## 2015-03-13 ENCOUNTER — Other Ambulatory Visit: Payer: Self-pay | Admitting: Surgery

## 2015-03-18 ENCOUNTER — Encounter (HOSPITAL_COMMUNITY): Payer: Self-pay | Admitting: Emergency Medicine

## 2015-03-18 ENCOUNTER — Observation Stay (HOSPITAL_COMMUNITY)
Admission: EM | Admit: 2015-03-18 | Discharge: 2015-03-19 | Disposition: A | Discharge status: Home / Self Care | Payer: Medicare Other | Attending: Surgery | Admitting: Surgery

## 2015-03-18 ENCOUNTER — Telehealth: Payer: Self-pay | Admitting: Surgery

## 2015-03-18 DIAGNOSIS — R339 Retention of urine, unspecified: Secondary | ICD-10-CM | POA: Insufficient documentation

## 2015-03-18 DIAGNOSIS — I152 Hypertension secondary to endocrine disorders: Secondary | ICD-10-CM | POA: Diagnosis present

## 2015-03-18 DIAGNOSIS — G4730 Sleep apnea, unspecified: Secondary | ICD-10-CM | POA: Insufficient documentation

## 2015-03-18 DIAGNOSIS — Z7984 Long term (current) use of oral hypoglycemic drugs: Secondary | ICD-10-CM | POA: Insufficient documentation

## 2015-03-18 DIAGNOSIS — Z7982 Long term (current) use of aspirin: Secondary | ICD-10-CM | POA: Insufficient documentation

## 2015-03-18 DIAGNOSIS — N179 Acute kidney failure, unspecified: Principal | ICD-10-CM | POA: Diagnosis present

## 2015-03-18 DIAGNOSIS — Z9049 Acquired absence of other specified parts of digestive tract: Secondary | ICD-10-CM | POA: Insufficient documentation

## 2015-03-18 DIAGNOSIS — I10 Essential (primary) hypertension: Secondary | ICD-10-CM | POA: Diagnosis not present

## 2015-03-18 DIAGNOSIS — R338 Other retention of urine: Secondary | ICD-10-CM

## 2015-03-18 DIAGNOSIS — K5900 Constipation, unspecified: Secondary | ICD-10-CM | POA: Insufficient documentation

## 2015-03-18 DIAGNOSIS — Z85038 Personal history of other malignant neoplasm of large intestine: Secondary | ICD-10-CM | POA: Insufficient documentation

## 2015-03-18 DIAGNOSIS — Z79899 Other long term (current) drug therapy: Secondary | ICD-10-CM | POA: Diagnosis not present

## 2015-03-18 DIAGNOSIS — R109 Unspecified abdominal pain: Secondary | ICD-10-CM | POA: Diagnosis present

## 2015-03-18 DIAGNOSIS — E1165 Type 2 diabetes mellitus with hyperglycemia: Secondary | ICD-10-CM | POA: Diagnosis not present

## 2015-03-18 DIAGNOSIS — E785 Hyperlipidemia, unspecified: Secondary | ICD-10-CM | POA: Diagnosis not present

## 2015-03-18 DIAGNOSIS — R81 Glycosuria: Secondary | ICD-10-CM

## 2015-03-18 DIAGNOSIS — E1159 Type 2 diabetes mellitus with other circulatory complications: Secondary | ICD-10-CM | POA: Diagnosis present

## 2015-03-18 DIAGNOSIS — E119 Type 2 diabetes mellitus without complications: Secondary | ICD-10-CM | POA: Diagnosis present

## 2015-03-18 LAB — URINE MICROSCOPIC-ADD ON: BACTERIA UA: NONE SEEN

## 2015-03-18 LAB — COMPREHENSIVE METABOLIC PANEL
ALK PHOS: 66 U/L (ref 38–126)
ALT: 28 U/L (ref 17–63)
ANION GAP: 12 (ref 5–15)
AST: 21 U/L (ref 15–41)
Albumin: 3.6 g/dL (ref 3.5–5.0)
BUN: 43 mg/dL — ABNORMAL HIGH (ref 6–20)
CALCIUM: 9.1 mg/dL (ref 8.9–10.3)
CO2: 24 mmol/L (ref 22–32)
CREATININE: 3.26 mg/dL — AB (ref 0.61–1.24)
Chloride: 96 mmol/L — ABNORMAL LOW (ref 101–111)
GFR, EST AFRICAN AMERICAN: 20 mL/min — AB (ref >60)
GFR, EST NON AFRICAN AMERICAN: 17 mL/min — AB (ref >60)
Glucose, Bld: 260 mg/dL — ABNORMAL HIGH (ref 65–99)
Potassium: 3.9 mmol/L (ref 3.5–5.1)
SODIUM: 132 mmol/L — AB (ref 135–145)
TOTAL PROTEIN: 7 g/dL (ref 6.5–8.1)
Total Bilirubin: 0.6 mg/dL (ref 0.3–1.2)

## 2015-03-18 LAB — CBC
HCT: 37.8 % — ABNORMAL LOW (ref 39.0–52.0)
HEMOGLOBIN: 13 g/dL (ref 13.0–17.0)
MCH: 32 pg (ref 26.0–34.0)
MCHC: 34.4 g/dL (ref 30.0–36.0)
MCV: 93.1 fL (ref 78.0–100.0)
PLATELETS: 183 10*3/uL (ref 150–400)
RBC: 4.06 MIL/uL — AB (ref 4.22–5.81)
RDW: 13.2 % (ref 11.5–15.5)
WBC: 11.3 10*3/uL — AB (ref 4.0–10.5)

## 2015-03-18 LAB — LIPASE, BLOOD: Lipase: 19 U/L (ref 11–51)

## 2015-03-18 LAB — URINALYSIS, ROUTINE W REFLEX MICROSCOPIC
Bilirubin Urine: NEGATIVE
Glucose, UA: >1000 mg/dL — AB
Ketones, ur: NEGATIVE mg/dL
LEUKOCYTES UA: NEGATIVE
NITRITE: NEGATIVE
PROTEIN: NEGATIVE mg/dL
SPECIFIC GRAVITY, URINE: 1.019 (ref 1.005–1.030)
pH: 5 (ref 5.0–8.0)

## 2015-03-18 LAB — CBG MONITORING, ED: Glucose-Capillary: 209 mg/dL — ABNORMAL HIGH (ref 65–99)

## 2015-03-18 LAB — GLUCOSE, CAPILLARY
GLUCOSE-CAPILLARY: 250 mg/dL — AB (ref 65–99)
GLUCOSE-CAPILLARY: 229 mg/dL — AB (ref 65–99)

## 2015-03-18 MED ORDER — LIP MEDEX EX OINT
1.0000 "application " | TOPICAL_OINTMENT | Freq: Two times a day (BID) | CUTANEOUS | Status: DC
  Administered 2015-03-18 – 2015-03-19 (×2): 1 via TOPICAL
  Filled 2015-03-18 (×3): qty 7

## 2015-03-18 MED ORDER — HYDROMORPHONE HCL 1 MG/ML IJ SOLN
0.5000 mg | INTRAMUSCULAR | Status: DC | PRN
Start: 2015-03-18 — End: 2015-03-19

## 2015-03-18 MED ORDER — HEPARIN SODIUM (PORCINE) 5000 UNIT/ML IJ SOLN
5000.0000 [IU] | Freq: Three times a day (TID) | INTRAMUSCULAR | Status: DC
  Administered 2015-03-19: 5000 [IU] via SUBCUTANEOUS
  Filled 2015-03-18 (×4): qty 1

## 2015-03-18 MED ORDER — ATORVASTATIN CALCIUM 20 MG PO TABS
20.0000 mg | ORAL_TABLET | Freq: Every day | ORAL | Status: DC
  Administered 2015-03-18: 20 mg via ORAL
  Filled 2015-03-18 (×2): qty 1

## 2015-03-18 MED ORDER — SODIUM CHLORIDE 0.9 % IV SOLN
8.0000 mg | Freq: Four times a day (QID) | INTRAVENOUS | Status: DC | PRN
  Filled 2015-03-18: qty 4

## 2015-03-18 MED ORDER — METOPROLOL TARTRATE 1 MG/ML IV SOLN
5.0000 mg | Freq: Four times a day (QID) | INTRAVENOUS | Status: DC | PRN
  Filled 2015-03-18: qty 5

## 2015-03-18 MED ORDER — POLYETHYLENE GLYCOL 3350 17 G PO PACK
17.0000 g | PACK | Freq: Two times a day (BID) | ORAL | Status: DC
  Filled 2015-03-18 (×2): qty 1

## 2015-03-18 MED ORDER — IOHEXOL 300 MG/ML  SOLN
50.0000 mL | Freq: Once | INTRAMUSCULAR | Status: DC | PRN
  Administered 2015-03-18: 50 mL via ORAL
  Filled 2015-03-18: qty 50

## 2015-03-18 MED ORDER — PROMETHAZINE HCL 25 MG/ML IJ SOLN: 6.2500 mg | INTRAMUSCULAR | Status: DC | PRN

## 2015-03-18 MED ORDER — TAMSULOSIN HCL 0.4 MG PO CAPS
0.4000 mg | ORAL_CAPSULE | Freq: Two times a day (BID) | ORAL | Status: DC
  Administered 2015-03-18 – 2015-03-19 (×2): 0.4 mg via ORAL
  Filled 2015-03-18 (×3): qty 1

## 2015-03-18 MED ORDER — LACTATED RINGERS IV SOLN
INTRAVENOUS | Status: DC
  Administered 2015-03-18: 21:00:00 via INTRAVENOUS

## 2015-03-18 MED ORDER — ONDANSETRON 4 MG PO TBDP: 4.0000 mg | ORAL_TABLET | Freq: Four times a day (QID) | ORAL | Status: DC | PRN

## 2015-03-18 MED ORDER — LACTATED RINGERS IV BOLUS (SEPSIS): 1000.0000 mL | Freq: Three times a day (TID) | INTRAVENOUS | Status: DC | PRN

## 2015-03-18 MED ORDER — ALUM & MAG HYDROXIDE-SIMETH 200-200-20 MG/5ML PO SUSP: 30.0000 mL | Freq: Four times a day (QID) | ORAL | Status: DC | PRN

## 2015-03-18 MED ORDER — IRBESARTAN 150 MG PO TABS
150.0000 mg | ORAL_TABLET | Freq: Every day | ORAL | Status: DC
  Administered 2015-03-18 – 2015-03-19 (×2): 150 mg via ORAL
  Filled 2015-03-18 (×2): qty 1

## 2015-03-18 MED ORDER — SACCHAROMYCES BOULARDII 250 MG PO CAPS
250.0000 mg | ORAL_CAPSULE | Freq: Two times a day (BID) | ORAL | Status: DC
  Administered 2015-03-18 – 2015-03-19 (×2): 250 mg via ORAL
  Filled 2015-03-18 (×3): qty 1

## 2015-03-18 MED ORDER — INSULIN ASPART 100 UNIT/ML ~~LOC~~ SOLN
0.0000 [IU] | Freq: Three times a day (TID) | SUBCUTANEOUS | Status: DC
  Administered 2015-03-18 – 2015-03-19 (×2): 5 [IU] via SUBCUTANEOUS

## 2015-03-18 MED ORDER — KCL IN DEXTROSE-NACL 40-5-0.45 MEQ/L-%-% IV SOLN: INTRAVENOUS | Status: DC

## 2015-03-18 MED ORDER — MAGNESIUM OXIDE 400 (241.3 MG) MG PO TABS
200.0000 mg | ORAL_TABLET | Freq: Every day | ORAL | Status: DC
  Administered 2015-03-18 – 2015-03-19 (×2): 200 mg via ORAL
  Filled 2015-03-18 (×2): qty 0.5

## 2015-03-18 MED ORDER — CETYLPYRIDINIUM CHLORIDE 0.05 % MT LIQD
7.0000 mL | Freq: Two times a day (BID) | OROMUCOSAL | Status: DC
  Administered 2015-03-18: 7 mL via OROMUCOSAL

## 2015-03-18 MED ORDER — OXYCODONE HCL 5 MG PO TABS
5.0000 mg | ORAL_TABLET | ORAL | Status: DC | PRN
  Administered 2015-03-19 (×2): 10 mg via ORAL
  Filled 2015-03-18 (×2): qty 2

## 2015-03-18 MED ORDER — INSULIN ASPART 100 UNIT/ML ~~LOC~~ SOLN: 0.0000 [IU] | Freq: Every day | SUBCUTANEOUS | Status: DC

## 2015-03-18 MED ORDER — ZOLPIDEM TARTRATE 5 MG PO TABS: 5.0000 mg | ORAL_TABLET | Freq: Every evening | ORAL | Status: DC | PRN

## 2015-03-18 MED ORDER — LACTATED RINGERS IV BOLUS (SEPSIS)
1000.0000 mL | Freq: Once | INTRAVENOUS | Status: AC
  Administered 2015-03-18: 1000 mL via INTRAVENOUS

## 2015-03-18 MED ORDER — MAGNESIUM HYDROXIDE 400 MG/5ML PO SUSP: 30.0000 mL | Freq: Two times a day (BID) | ORAL | Status: DC | PRN

## 2015-03-18 MED ORDER — ONDANSETRON HCL 4 MG/2ML IJ SOLN: 4.0000 mg | Freq: Four times a day (QID) | INTRAMUSCULAR | Status: DC | PRN

## 2015-03-18 MED ORDER — MENTHOL 3 MG MT LOZG
1.0000 | LOZENGE | OROMUCOSAL | Status: DC | PRN
  Filled 2015-03-18: qty 9

## 2015-03-18 MED ORDER — DIPHENHYDRAMINE HCL 50 MG/ML IJ SOLN: 12.5000 mg | Freq: Four times a day (QID) | INTRAMUSCULAR | Status: DC | PRN

## 2015-03-18 MED ORDER — ASPIRIN 81 MG PO CHEW
81.0000 mg | CHEWABLE_TABLET | Freq: Every day | ORAL | Status: DC
  Administered 2015-03-18 – 2015-03-19 (×2): 81 mg via ORAL
  Filled 2015-03-18 (×2): qty 1

## 2015-03-18 MED ORDER — HYDROCORTISONE ACE-PRAMOXINE 2.5-1 % RE CREA: 1.0000 "application " | TOPICAL_CREAM | Freq: Four times a day (QID) | RECTAL | Status: DC | PRN

## 2015-03-18 MED ORDER — MAGIC MOUTHWASH
15.0000 mL | Freq: Four times a day (QID) | ORAL | Status: DC | PRN
  Administered 2015-03-18: 15 mL via ORAL
  Filled 2015-03-18 (×2): qty 15

## 2015-03-18 MED ORDER — METFORMIN HCL 500 MG PO TABS: 1000.0000 mg | ORAL_TABLET | Freq: Two times a day (BID) | ORAL | Status: DC

## 2015-03-18 MED ORDER — PHENOL 1.4 % MT LIQD
2.0000 | OROMUCOSAL | Status: DC | PRN
  Filled 2015-03-18: qty 177

## 2015-03-18 MED ORDER — WITCH HAZEL-GLYCERIN EX PADS: 1.0000 "application " | MEDICATED_PAD | CUTANEOUS | Status: DC | PRN

## 2015-03-18 MED ORDER — METOPROLOL TARTRATE 25 MG PO TABS
12.5000 mg | ORAL_TABLET | Freq: Two times a day (BID) | ORAL | Status: DC | PRN
  Filled 2015-03-18: qty 0.5

## 2015-03-18 MED ORDER — MAGNESIUM 200 MG PO TABS: 200.0000 mg | ORAL_TABLET | Freq: Every day | ORAL | Status: DC

## 2015-03-18 MED ORDER — BISMUTH SUBSALICYLATE 262 MG/15ML PO SUSP: 30.0000 mL | Freq: Three times a day (TID) | ORAL | Status: DC | PRN

## 2015-03-18 MED ORDER — ACETAMINOPHEN 500 MG PO TABS
1000.0000 mg | ORAL_TABLET | Freq: Three times a day (TID) | ORAL | Status: DC
  Administered 2015-03-18 – 2015-03-19 (×3): 1000 mg via ORAL
  Filled 2015-03-18 (×5): qty 2

## 2015-03-18 MED ORDER — GLIMEPIRIDE 2 MG PO TABS
2.0000 mg | ORAL_TABLET | Freq: Every day | ORAL | Status: DC
Start: 2015-03-19 — End: 2015-03-19
  Administered 2015-03-19: 2 mg via ORAL
  Filled 2015-03-18 (×2): qty 1

## 2015-03-18 MED ORDER — CHLORHEXIDINE GLUCONATE 0.12 % MT SOLN
15.0000 mL | Freq: Two times a day (BID) | OROMUCOSAL | Status: DC
  Administered 2015-03-19: 15 mL via OROMUCOSAL
  Filled 2015-03-18 (×2): qty 15

## 2015-03-18 NOTE — ED Notes (Signed)
Incontinent of stool

## 2015-03-18 NOTE — ED Notes (Signed)
Pt had hemorrhoid removed Friday of last week. Saturday was unable to urinate. Has not passed gas or BM since surgery. Abdomin very very firm and distended. Generalized abdominal pain.

## 2015-03-18 NOTE — H&P (Addendum)
[facility]  [address] [place], [place] [address] Phone: [phone] FAX: [phone]     Walter [name]  Jul 20, 1940 [id number]  CARE TEAM:  PCP: Walter Reddish, MD  Outpatient Care Team: Patient Care Team: Walter Olp, MD as PCP - General (Family Medicine) Walter Boston, MD as Consulting Physician (General Surgery) Walter Artist, MD as Consulting Physician (Gastroenterology)  Inpatient Treatment Team: Treatment Team: Attending Provider: Michael Boston, MD; Registered Nurse: Walter Griffins, RN; Technician: Walter [name], EMT  This patient is a 74 y.o.male who presents today for surgical evaluation.   Reason for evaluation: Urinary retention & obstipation  DM gentleman with worsening hemorrhoid problems.  Underwent hemorrhoid resection as well as ligation and pexy on Friday 11 November.  He has been struggling with feeling constipated and with difficulty urinating.  Initially was not urinating well.  Wife thought husband was urinating better (not yet normal) Sunday but patient says otherwise.  Has been stated he was just having some dribbling.  Became more noticeable to the wife yesterday morning.  Called the office yesterday AM - we recommended Flomax and ciprofloxacin.  Patient not able to urinate last evening.  Called office again this morning.  I recommended come to the emergency room.  Wife's main frustration has been trying to follow recommendations and patient not getting better despite many calls.  Patient and wife was concerned that he has not had a bowel movement since surgery.  His anal pain has been under relative control but a lot of pressure in his lower abdomen.  He had been taking some bowel regimen.  It was recommended he try mag citrate and then try MiraLax/milk of magnesia.  Did not get much result.  Felt more abdominal pressure and quit.  His appetite has been down but eating somewhat.  No nausea or vomiting.   He has been having some flatus although not a lot.  Concerned.   They came to the emergency room.  Foley catheter placed with over a liter return.  Patient feels much better now.  He had loose stool as well.  They were planning a CT scan.  He was able to drink all the contrast without difficulty.  No nausea or vomiting or pressure.   Apparently high glucose in the urine.  Abdominal pressure down.  He has had some discomfort around the anus.  He says it feels dry even though he has drainage.  Wondering if he should take Preparation H.  Past Medical History  Diagnosis Date  . Hypertension   . Hyperlipidemia   . Diabetes mellitus   . History of shingles   . Colon cancer ([facility]) 1992    colon resection 1992  . Sleep apnea     cpap    Past Surgical History  Procedure Laterality Date  . Colectomy  1992  . Appendectomy  1949    Social History   Social History  . Marital Status: Married    Spouse Name: N/A  . Number of Children: N/A  . Years of Education: N/A   Occupational History  . Not on file.   Social History Main Topics  . Smoking status: Never Smoker   . Smokeless tobacco: Never Used     Comment: smoked for less that 6 months in 1960  . Alcohol Use: 1.2 oz/week    2 Cans of beer per week     Comment: 2 cans previously, cut out now to help  with diabetes control. rare  . Drug Use: No  . Sexual Activity: Not on file   Other Topics Concern  . Not on file   Social History Narrative   Married 1966. 2 daughters Sharee Pimple divorced lives in [place] works for [employer] no kids and Mateo Flow never married in [place], [place] working for [employer] for [employer] improvement no kids.       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 Cancer      colon/fhx  . Colon cancer Father 10  . Rectal  cancer Maternal Uncle   . Colon cancer Paternal Grandmother 42  . Diabetes Father     type I  . Alzheimer's disease Mother     Current Facility-Administered Medications  Medication Dose Route Frequency Provider Last Rate Last Dose  . acetaminophen (TYLENOL) tablet 1,000 mg  1,000 mg Oral TID Walter Boston, MD      . alum & mag hydroxide-simeth (MAALOX/MYLANTA) 200-200-20 MG/5ML suspension 30 mL  30 mL Oral Q6H PRN Walter Boston, MD      . aspirin tablet 81 mg  81 mg Oral Daily Walter Boston, MD      . atorvastatin (LIPITOR) tablet 20 mg  20 mg Oral q1800 Walter Boston, MD      . bismuth subsalicylate (PEPTO BISMOL) 262 MG/15ML suspension 30 mL  30 mL Oral Q8H PRN Walter Boston, MD      . diphenhydrAMINE (BENADRYL) injection 12.5-25 mg  12.5-25 mg Intravenous Q6H PRN Walter Boston, MD      . Derrill Memo ON 03/19/2015] glimepiride (AMARYL) tablet 2 mg  2 mg Oral QAC breakfast Walter Boston, MD      . Derrill Memo ON 03/19/2015] heparin injection 5,000 Units  5,000 Units Subcutaneous 3 times per day Walter Boston, MD      . hydrocortisone-pramoxine [facility]) 2.5-1 % rectal cream 1 application  1 application Rectal E1D PRN Walter Boston, MD      . HYDROmorphone (DILAUDID) injection 0.5-2 mg  0.5-2 mg Intravenous Q2H PRN Walter Boston, MD      . iohexol (OMNIPAQUE) 300 MG/ML solution 50 mL  50 mL Oral Once PRN Charlesetta Shanks, MD   50 mL at 03/18/15 1213  . irbesartan (AVAPRO) tablet 150 mg  150 mg Oral Daily Walter Boston, MD      . lactated ringers bolus 1,000 mL  1,000 mL Intravenous Once Walter Boston, MD      . lactated ringers bolus 1,000 mL  1,000 mL Intravenous Once Walter Boston, MD      . lactated ringers bolus 1,000 mL  1,000 mL Intravenous Q8H PRN Walter Boston, MD      . lactated ringers infusion   Intravenous Continuous Walter Boston, MD      . lip balm (CARMEX) ointment 1 application  1 application Topical BID Walter Boston, MD      . magic mouthwash  15 mL Oral QID PRN Walter Boston, MD      . magnesium  hydroxide (MILK OF MAGNESIA) suspension 30 mL  30 mL Oral Q12H PRN Walter Boston, MD      . Magnesium TABS 200 mg  200 mg Oral Daily Walter Boston, MD      . menthol-cetylpyridinium (CEPACOL) lozenge 3 mg  1 lozenge Oral PRN Walter Boston,  MD      . metFORMIN (GLUCOPHAGE) tablet 1,000 mg  1,000 mg Oral BID WC Walter Boston, MD      . metoprolol (LOPRESSOR) injection 5 mg  5 mg Intravenous Q6H PRN Walter Boston, MD      . metoprolol tartrate (LOPRESSOR) tablet 12.5 mg  12.5 mg Oral Q12H PRN Walter Boston, MD      . ondansetron [facility]) injection 4 mg  4 mg Intravenous Q6H PRN Walter Boston, MD       Or  . ondansetron (ZOFRAN) 8 mg in sodium chloride 0.9 % 50 mL IVPB  8 mg Intravenous Q6H PRN Walter Boston, MD      . ondansetron (ZOFRAN-ODT) disintegrating tablet 4-8 mg  4-8 mg Oral Q6H PRN Walter Boston, MD      . oxyCODONE (Oxy IR/ROXICODONE) immediate release tablet 5-10 mg  5-10 mg Oral Q4H PRN Walter Boston, MD      . phenol (CHLORASEPTIC) mouth spray 2 spray  2 spray Mouth/Throat PRN Walter Boston, MD      . polyethylene glycol (MIRALAX / GLYCOLAX) packet 17 g  17 g Oral BID Walter Boston, MD      . promethazine (PHENERGAN) injection 6.25-12.5 mg  6.25-12.5 mg Intravenous Q4H PRN Walter Boston, MD      . saccharomyces boulardii (FLORASTOR) capsule 250 mg  250 mg Oral BID Walter Boston, MD      . tamsulosin [facility]) capsule 0.4 mg  0.4 mg Oral BID Walter Boston, MD      . witch hazel-glycerin (TUCKS) pad 1 application  1 application Topical PRN Walter Boston, MD      . zolpidem (AMBIEN) tablet 5-10 mg  5-10 mg Oral QHS PRN Walter Boston, MD       Current Outpatient Prescriptions  Medication Sig Dispense Refill  . amLODipine (NORVASC) 5 MG tablet Take 1 tablet (5 mg total) by mouth daily. 90 tablet 3  . aspirin 81 MG tablet Take 81 mg by mouth daily.    Marland Kitchen atorvastatin (LIPITOR) 20 MG tablet Take 1 pill by mouth once per week 12 tablet 3  . CHROMIUM PICOLINATE PO Take 200 mg by mouth daily.    Marland Kitchen CINNAMON PO  Take 200 mg by mouth daily.     . ciprofloxacin (CIPRO) 500 MG tablet Take 1 tablet by mouth 2 (two) times daily. 03/17/15-03/22/15    . glimepiride (AMARYL) 2 MG tablet Take 1 tablet (2 mg total) by mouth daily before breakfast. 90 tablet 1  . glucose blood (ONETOUCH VERIO) test strip Test once daily. 100 each 12  . LIFESCAN FINEPOINT LANCETS MISC Once daily 100 each 5  . Lutein 20 MG CAPS Take 20 mg by mouth daily.    . Magnesium 200 MG TABS Take 200 mg by mouth daily.    . metFORMIN (GLUCOPHAGE) 1000 MG tablet Take 1,000 mg by mouth 2 (two) times daily with a meal.    . Multiple Vitamins-Minerals (CENTRUM SILVER PO) Take 1 tablet by mouth daily.     . Omega-3 Fatty Acids (FISH OIL PO) Take 1,000 mg by mouth daily.    Marland Kitchen oxyCODONE (OXY IR/ROXICODONE) 5 MG immediate release tablet Take 1-2 tablets by mouth every 4 (four) hours as needed. pain  0  . saw palmetto 160 MG capsule Take 160 mg by mouth 2 (two) times daily.    . tamsulosin (FLOMAX) 0.4 MG CAPS capsule Take 1 capsule by mouth 2 (two) times daily. 03/17/15-03/26/15    . telmisartan (  MICARDIS) 40 MG tablet Take 1 tablet (40 mg total) by mouth daily. 90 tablet 3  . vitamin E 400 UNIT capsule Take 400 Units by mouth daily.     Facility-Administered Medications Ordered in Other Encounters  Medication Dose Route Frequency Provider Last Rate Last Dose  . pneumococcal 13-valent conjugate vaccine (PREVNAR 13) injection 0.5 mL  0.5 mL Intramuscular Tomorrow-1000 Ricard Dillon, MD         No Known Allergies  ROS: Constitutional:  No fevers, chills, sweats.  Weight stable Eyes:  No vision changes, No discharge HENT:  No sore throats, nasal drainage Lymph: No neck swelling, No bruising easily Pulmonary:  No cough, productive sputum CV: No orthopnea, PND   No exertional chest/neck/shoulder/arm pain. GI: No personal nor family history of GI/colon cancer, inflammatory bowel disease, irritable bowel syndrome, allergy such as Celiac Sprue,  dietary/dairy problems, colitis, ulcers nor gastritis.  No recent sick contacts/gastroenteritis.  No travel outside the country.  No changes in diet. Renal: No hematuria.  Difficulty with urination Genital:  No drainage, bleeding, masses Musculoskeletal: No severe joint pain.  Good ROM major joints Skin:  No sores or lesions.  No rashes Heme/Lymph:  No easy bleeding.  No swollen lymph nodes Neuro: No focal weakness/numbness.  No seizures Psych: No suicidal ideation.  No hallucinations  BP 136/67 mmHg  Pulse 99  Temp(Src) 98.5 F (36.9 C) (Oral)  Resp 16  SpO2 99%  Physical Exam: General: Pt awake/alert/oriented x4 in no major acute distress Eyes: PERRL, normal EOM. Sclera nonicteric Neuro: CN II-XII intact w/o focal sensory/motor deficits. Lymph: No head/neck/groin lymphadenopathy Psych:  No delerium/psychosis/paranoia HENT: Normocephalic, Mucus membranes moist.  No thrush Neck: Supple, No tracheal deviation Chest: No pain.  Good respiratory excursion. CV:  Pulses intact.  Regular rhythm Abdomen: Soft, Nondistended.  Nontender.  No incarcerated hernias.  Foley in place with clear yellow urine  Rectal: Deferred given recent exam Ext:  SCDs BLE.  No significant edema.  No cyanosis Skin: No petechiae / purpurea.  No major sores Musculoskeletal: No severe joint pain.  Good ROM major joints   Results:   Labs: Results for orders placed or performed during the hospital encounter of 03/18/15 (from the past 48 hour(s))  Lipase, blood     Status: None   Collection Time: 03/18/15 11:26 AM  Result Value Ref Range   Lipase 19 11 - 51 U/L  Comprehensive metabolic panel     Status: Abnormal   Collection Time: 03/18/15 11:26 AM  Result Value Ref Range   Sodium 132 (L) 135 - 145 mmol/L   Potassium 3.9 3.5 - 5.1 mmol/L   Chloride 96 (L) 101 - 111 mmol/L   CO2 24 22 - 32 mmol/L   Glucose, Bld 260 (H) 65 - 99 mg/dL   BUN 43 (H) 6 - 20 mg/dL   Creatinine, Ser 3.26 (H) 0.61 - 1.24 mg/dL    Calcium 9.1 8.9 - 10.3 mg/dL   Total Protein 7.0 6.5 - 8.1 g/dL   Albumin 3.6 3.5 - 5.0 g/dL   AST 21 15 - 41 U/L   ALT 28 17 - 63 U/L   Alkaline Phosphatase 66 38 - 126 U/L   Total Bilirubin 0.6 0.3 - 1.2 mg/dL   GFR calc non Af Amer 17 (L) >60 mL/min   GFR calc Af Amer 20 (L) >60 mL/min    Comment: (NOTE) The eGFR has been calculated using the CKD EPI equation. This calculation has not been validated  in all clinical situations. eGFR's persistently <60 mL/min signify possible Chronic Kidney Disease.    Anion gap 12 5 - 15  CBC     Status: Abnormal   Collection Time: 03/18/15 11:26 AM  Result Value Ref Range   WBC 11.3 (H) 4.0 - 10.5 K/uL   RBC 4.06 (L) 4.22 - 5.81 MIL/uL   Hemoglobin 13.0 13.0 - 17.0 g/dL   HCT 37.8 (L) 39.0 - 52.0 %   MCV 93.1 78.0 - 100.0 fL   MCH 32.0 26.0 - 34.0 pg   MCHC 34.4 30.0 - 36.0 g/dL   RDW 13.2 11.5 - 15.5 %   Platelets 183 150 - 400 K/uL  Urinalysis, Routine w reflex microscopic (not at [facility])     Status: Abnormal   Collection Time: 03/18/15 12:09 PM  Result Value Ref Range   Color, Urine YELLOW YELLOW   APPearance CLOUDY (A) CLEAR   Specific Gravity, Urine 1.019 1.005 - 1.030   pH 5.0 5.0 - 8.0   Glucose, UA >1000 (A) NEGATIVE mg/dL   Hgb urine dipstick SMALL (A) NEGATIVE   Bilirubin Urine NEGATIVE NEGATIVE   Ketones, ur NEGATIVE NEGATIVE mg/dL   Protein, ur NEGATIVE NEGATIVE mg/dL   Nitrite NEGATIVE NEGATIVE   Leukocytes, UA NEGATIVE NEGATIVE  Urine microscopic-add on     Status: Abnormal   Collection Time: 03/18/15 12:09 PM  Result Value Ref Range   Squamous Epithelial / LPF 0-5 (A) NONE SEEN    Comment: Please note change in reference range.   WBC, UA 0-5 0 - 5 WBC/hpf    Comment: Please note change in reference range.   RBC / HPF 0-5 0 - 5 RBC/hpf    Comment: Please note change in reference range.   Bacteria, UA NONE SEEN NONE SEEN    Comment: Please note change in reference range.    Imaging / Studies: No results  found.  Medications / Allergies: per chart  Antibiotics: Anti-infectives    None      Assessment  Walter [name]  74 y.o. male       Problem List:  Principal Problem:   Acute renal failure (ARF)  Active Problems:   Diabetes mellitus type II, uncontrolled ([place])   Essential hypertension   Acute urinary retention   Glucose found in urine on examination   ARF most likely due to urinary retention & probable dehydration  Plan:  -overnight evaluation -rehydration -r/o UTI -Foley x 72hrs minimum -flomax -DM control - check HgbA1C -HTN control -avoid NSAIDs -bowel regimen -pain control -VTE prophylaxis- SCDs, etc -mobilize as tolerated to help recovery  I would cancel the CT scan.  He only had hemorrhoid surgery.  He feels much better with Foley catheter placed.  My suspicious for anything more severe as low.  If he worsens or does not improve, may reconsider that.  Hold off for now.  Able to give contrast daily given her elevated creatinine.  I updated the patient's status to the patient & the patient's spouse.  They had many frustrations & concerns.  We talked for 30 minutes.  I apologized for the suboptimal communication and feedback.  We will work to correct that.  Recommendations were made.  Questions were answered.  They expressed understanding & appreciation.   Adin Hector, M.D., F.A.C.S. Gastrointestinal and Minimally Invasive Surgery [facility] [address] [place], [place] [address] [phone] Main / Paging   03/18/2015  Note: Portions of this report may  have been transcribed using voice recognition software. Every effort was made to ensure accuracy; however, inadvertent computerized transcription errors may be present.   Any transcriptional errors that result from this process are unintentional.

## 2015-03-18 NOTE — ED Provider Notes (Signed)
CSN: [id number]     Arrival date & time 03/18/15  1003 History   First MD Initiated Contact with Patient 03/18/15 1045     Chief Complaint  Patient presents with  . Post-op Problem  . Abdominal Pain  . Urinary Retention     (Consider location/radiation/quality/duration/timing/severity/associated sxs/prior Treatment) HPI Patient had outpatient surgical hemorrhoidectomy 6 days ago. Since that time he has been unable to have a bowel movement. His wife reports that they have been in contact with the surgical office every day this week and following the provided instructions. He has been trying MiraLAX every 2 hours as recommended by the surgical office. He has been continuing to try to take in fluids. He still has not had any bowel movement. At this point he has developed decreased urine output and is having leakage of urine with inability to urinate regularly. He reports significant abdominal discomfort and distention. He has not had vomiting but has developed nausea. He has not had fever. Past Medical History  Diagnosis Date  . Hypertension   . Hyperlipidemia   . Diabetes mellitus   . History of shingles   . Colon cancer ([place]) 1992    colon resection 1992  . Sleep apnea     cpap   Past Surgical History  Procedure Laterality Date  . Colectomy  1992  . Appendectomy  1949   Family History  Problem Relation Age of Onset  . Cancer      colon/fhx  . Colon cancer Father 64  . Rectal cancer Maternal Uncle   . Colon cancer Paternal Grandmother 30  . Diabetes Father     type I  . Alzheimer's disease Mother    Social History  Substance Use Topics  . Smoking status: Never Smoker   . Smokeless tobacco: Never Used     Comment: smoked for less that 6 months in 1960  . Alcohol Use: 1.2 oz/week    2 Cans of beer per week     Comment: 2 cans previously, cut out now to help with diabetes control. rare    Review of Systems 10 Systems reviewed and are negative for acute change except as  noted in the HPI.    Allergies  Review of patient's allergies indicates no known allergies.  Home Medications   Prior to Admission medications   Medication Sig Start Date End Date Taking? Authorizing Provider  amLODipine (NORVASC) 5 MG tablet Take 1 tablet (5 mg total) by mouth daily. 04/21/14  Yes [name], MD  aspirin 81 MG tablet Take 81 mg by mouth daily.   Yes Historical Provider, MD  atorvastatin (LIPITOR) 20 MG tablet Take 1 pill by mouth once per week 04/21/14  Yes [name], MD  CHROMIUM PICOLINATE PO Take 200 mg by mouth daily.   Yes Historical Provider, MD  CINNAMON PO Take 200 mg by mouth daily.    Yes Historical Provider, MD  glimepiride (AMARYL) 2 MG tablet Take 1 tablet (2 mg total) by mouth daily before breakfast. 11/21/14  Yes [name], MD  glucose blood (ONETOUCH VERIO) test strip Test once daily. 04/21/14  Yes [name], MD  [facility] FINEPOINT LANCETS [place] Once daily 04/21/14  Yes [name], MD  Lutein 20 MG CAPS Take 20 mg by mouth daily.   Yes Historical Provider, MD  Magnesium 200 MG TABS Take 200 mg by mouth daily.   Yes Historical Provider, MD  metFORMIN (GLUCOPHAGE) 1000 MG tablet Take 1,000  mg by mouth 2 (two) times daily with a meal.   Yes Historical Provider, MD  Multiple Vitamins-Minerals (CENTRUM SILVER PO) Take 1 tablet by mouth daily.    Yes Historical Provider, MD  Omega-3 Fatty Acids (FISH OIL PO) Take 1,000 mg by mouth daily.   Yes Historical Provider, MD  saw palmetto 160 MG capsule Take 160 mg by mouth 2 (two) times daily.   Yes Historical Provider, MD  telmisartan (MICARDIS) 40 MG tablet Take 1 tablet (40 mg total) by mouth daily. 04/21/14  Yes [name], MD  vitamin E 400 UNIT capsule Take 400 Units by mouth daily.   Yes Historical Provider, MD  hydrocortisone (ANUSOL-HC) 2.5 % rectal cream Place 1 application rectally every 6 (six) hours as needed for hemorrhoids or itching. 03/19/15   Michael Boston, MD   oxyCODONE (OXY IR/ROXICODONE) 5 MG immediate release tablet Take 1-2 tablets (5-10 mg total) by mouth every 4 (four) hours as needed for moderate pain or severe pain. pain 03/19/15   Michael Boston, MD  tamsulosin (FLOMAX) 0.4 MG CAPS capsule Take 1 capsule (0.4 mg total) by mouth 2 (two) times daily. 03/17/15-03/26/15 03/19/15   Michael Boston, MD   BP 100/51 mmHg  Pulse 81  Temp(Src) 99.6 F (37.6 C) (Oral)  Resp 16  Ht 6' (1.829 m)  Wt 216 lb 8 oz (98.204 kg)  BMI 29.36 kg/m2  SpO2 94% Physical Exam  Constitutional: He is oriented to person, place, and time. He appears well-developed and well-nourished.  Patient is alert and nontoxic but does appear very uncomfortable. No respiratory distress with clear mental status.  HENT:  Head: Normocephalic and atraumatic.  Mouth/Throat: Oropharynx is clear and moist.  Eyes: EOM are normal. Pupils are equal, round, and reactive to light. No scleral icterus.  Neck: Neck supple.  Cardiovascular: Normal rate, regular rhythm, normal heart sounds and intact distal pulses.   Pulmonary/Chest: Effort normal and breath sounds normal.  Abdominal: Bowel sounds are normal. He exhibits distension. There is tenderness.  Patient's lower abdomen is very firm with hard palpable mass consistent with bladder. This is tender. The patient has hyperactive bowel sounds.  Genitourinary:  Rectal exam shows stool leakage filling the gluteal cleft. I cannot directly visualize the hemorrhoidal incision site due to stool. By palpating I cannot discern a specific fluctuance. Digital interior exam does not reveal impaction to the level of my digit. Patient is very tender on digital exam.  Musculoskeletal: Normal range of motion. He exhibits no edema or tenderness.  Neurological: He is alert and oriented to person, place, and time. He has normal strength. No cranial nerve deficit. He exhibits normal muscle tone. Coordination normal. GCS eye subscore is 4. GCS verbal subscore is 5.  GCS motor subscore is 6.  Skin: Skin is warm, dry and intact.  Psychiatric: He has a normal mood and affect.    ED Course  Procedures (including critical care time) Labs Review Labs Reviewed  COMPREHENSIVE METABOLIC PANEL - Abnormal; Notable for the following:    Sodium 132 (*)    Chloride 96 (*)    Glucose, Bld 260 (*)    BUN 43 (*)    Creatinine, Ser 3.26 (*)    GFR calc non Af Amer 17 (*)    GFR calc Af Amer 20 (*)    All other components within normal limits  CBC - Abnormal; Notable for the following:    WBC 11.3 (*)    RBC 4.06 (*)  HCT 37.8 (*)    All other components within normal limits  URINALYSIS, ROUTINE W REFLEX MICROSCOPIC (NOT AT [facility]) - Abnormal; Notable for the following:    APPearance CLOUDY (*)    Glucose, UA >1000 (*)    Hgb urine dipstick SMALL (*)    All other components within normal limits  URINE MICROSCOPIC-ADD ON - Abnormal; Notable for the following:    Squamous Epithelial / LPF 0-5 (*)    All other components within normal limits  GLUCOSE, CAPILLARY - Abnormal; Notable for the following:    Glucose-Capillary 250 (*)    All other components within normal limits  BASIC METABOLIC PANEL - Abnormal; Notable for the following:    Chloride 100 (*)    Glucose, Bld 254 (*)    Calcium 8.4 (*)    All other components within normal limits  HEMOGLOBIN A1C - Abnormal; Notable for the following:    Hgb A1c MFr Bld 8.5 (*)    All other components within normal limits  GLUCOSE, CAPILLARY - Abnormal; Notable for the following:    Glucose-Capillary 229 (*)    All other components within normal limits  GLUCOSE, CAPILLARY - Abnormal; Notable for the following:    Glucose-Capillary 183 (*)    All other components within normal limits  GLUCOSE, CAPILLARY - Abnormal; Notable for the following:    Glucose-Capillary 204 (*)    All other components within normal limits  GLUCOSE, CAPILLARY - Abnormal; Notable for the following:    Glucose-Capillary 203 (*)    All  other components within normal limits  CBG MONITORING, ED - Abnormal; Notable for the following:    Glucose-Capillary 209 (*)    All other components within normal limits  URINE CULTURE  LIPASE, BLOOD    Imaging Review No results found. I have personally reviewed and evaluated these images and lab results as part of my medical decision-making.   EKG Interpretation None      MDM   Final diagnoses:  Acute urinary retention   Patient with post hemorrhoidectomy urinary retension and constipation. Will be admitted by surgery for observation. Patient has had worsening course of constipation and abdominal pain post hemorrhoidectomy. Outpatient recommendations had been made by surgical office without improvement. Patient had significant urinary retention which has been resolved with placement of Foley catheter. There is no evidence of stool impaction on digital examination. At this time CT scan of the abdomen has been d/c by surgery as the patient is being admitted for observation.    Charlesetta Shanks, MD 04/01/15 [phone]

## 2015-03-18 NOTE — Telephone Encounter (Addendum)
Walter [name]  November 30, 1940 [id number]  Patient Care Team: [name], MD as PCP - General (Family Medicine) Michael Boston, MD as Consulting Physician (General Surgery) Ladene Artist, MD as Consulting Physician (Gastroenterology)  This patient is a 74 y.o.male who calls today for surgical evaluation.   Date of procedure/visit: 03/13/2015  Surgery: Internal hemorrhoidectomy/ligation/pexy  Reason for call: difficulty urinating & constipation  Called from patient's home.  Patient underwent excision of internal and external hemorrhoids and hemorrhoidal pexy last Friday 11/11 at the [facility] surgical center, 5 days ago.  Called yesterday, Monday, morning saying having difficulty feeling like not urinating completely.  Passing gas & tolerating food but no bowel movement.  He drank a whole bottle of mag citrate Sunday night.  We recommended switching to MiraLax or milk of magnesia.  Double dose every 2 hours until moves bowels.  They called back and said the urine seemed foul smelling as well.  We called in some ciprofloxacin and Flomax.  Noted if not better may need to see urology.  They are calling back this morning saying patient cannot urinate.  Recommended evaluation emergency room.  Most likely needs Foley catheterization and blood work to make sure there is no electrolyte abnormalities.  I am skeptical of any obstruction since the hemorrhoids were just superficial resections.   Had also given handouts:   ANORECTAL SURGERY:  POST OPERATIVE INSTRUCTIONS  1. Take your usually prescribed home medications unless otherwise directed. 2. DIET: Follow a light bland diet the first 24 hours after arrival home, such as soup, liquids, crackers, etc.  Be sure to include lots of fluids daily.  Avoid fast food or heavy meals as your are more likely to get nauseated.  Eat a low fat the next few days after surgery.   3. PAIN CONTROL: a. Pain is best controlled by a usual combination of three different  methods TOGETHER: i. Ice/Heat ii. Over the counter pain medication iii. Prescription pain medication b. Most patients will experience some swelling and discomfort in the anus/rectal area. and incisions.  Ice packs or heat (30-60 minutes up to 6 times a day) will help. Use ice for the first few days to help decrease swelling and bruising, then switch to heat such as warm towels, sitz baths, warm baths, etc to help relax tight/sore spots and speed recovery.  Some people prefer to use ice alone, heat alone, alternating between ice & heat.  Experiment to what works for you.  Swelling and bruising can take several weeks to resolve.   c. It is helpful to take an over-the-counter pain medication regularly for the first few weeks.  Choose one of the following that works best for you: i. Naproxen (Aleve, etc)  Two 220mg  tabs twice a day ii. Ibuprofen (Advil, etc) Three 200mg  tabs four times a day (every meal & bedtime) iii. Acetaminophen (Tylenol, etc) 500-650mg  four times a day (every meal & bedtime) d. A  prescription for pain medication (such as oxycodone, hydrocodone, etc) should be given to you upon discharge.  Take your pain medication as prescribed.  i. If you are having problems/concerns with the prescription medicine (does not control pain, nausea, vomiting, rash, itching, etc), please call us [phone] to see if we need to switch you to a different pain medicine that will work better for you and/or control your side effect better. ii. If you need a refill on your pain medication, please contact your pharmacy.  They will contact our office to request authorization.  Prescriptions will not be filled after 5 pm or on week-ends.  Use a Sitz Bath 4-8 times a day for relief   [employer] A sitz bath is a warm water bath taken in the sitting position that covers only the hips and buttocks. It may be used for either healing or hygiene purposes. Sitz baths are also used to relieve pain, itching, or muscle  spasms. The water may contain medicine. Moist heat will help you heal and relax.  HOME CARE INSTRUCTIONS  Take 3 to 4 sitz baths a day. 1. Fill the bathtub half full with warm water. 2. Sit in the water and open the drain a little. 3. Turn on the warm water to keep the tub half full. Keep the water running constantly. 4. Soak in the water for 15 to 20 minutes. 5. After the sitz bath, pat the affected area dry first.   4. KEEP YOUR BOWELS REGULAR a. The goal is one bowel movement a day b. Avoid getting constipated.  Between the surgery and the pain medications, it is common to experience some constipation.  Increasing fluid intake and taking a fiber supplement (such as Metamucil, Citrucel, FiberCon, MiraLax, etc) 1-2 times a day regularly will usually help prevent this problem from occurring.  A mild laxative (prune juice, Milk of Magnesia, MiraLax, etc) should be taken according to package directions if there are no bowel movements after 48 hours. c. Watch out for diarrhea.  If you have many loose bowel movements, simplify your diet to bland foods & liquids for a few days.  Stop any stool softeners and decrease your fiber supplement.  Switching to mild anti-diarrheal medications (Kayopectate, Pepto Bismol) can help.  If this worsens or does not improve, please call us.  5. Wound Care a. Remove your bandages the day after surgery.  Unless discharge instructions indicate otherwise, leave your bandage dry and in place overnight.  Remove the bandage during your first bowel movement.   b. Allow the wound packing to fall out over the next few days.  You can trim exposed gauze / ribbon as it falls out.  You do not need to repack the wound unless instructed otherwise.  Wear an absorbent pad or soft cotton gauze in your underwear as needed to catch any drainage and help keep the area  c. Keep the area clean and dry.  Bathe / shower every day.  Keep the area clean by showering / bathing over the incision /  wound.   It is okay to soak an open wound to help wash it.  Wet wipes or showers / gentle washing after bowel movements is often less traumatic than regular toilet paper. d. Walter [name] may have some styrofoam-like soft packing in the rectum which will come out with the first bowel movement.  e. You will often notice bleeding with bowel movements.  This should slow down by the end of the first week of surgery f. Expect some drainage.  This should slow down, too, by the end of the first week of surgery.  Wear an absorbent pad or soft cotton gauze in your underwear until the drainage stops. 6. ACTIVITIES as tolerated:   a. You may resume regular (light) daily activities beginning the next day-such as daily self-care, walking, climbing stairs-gradually increasing activities as tolerated.  If you can walk 30 minutes without difficulty, it is safe to try more intense activity such as jogging, treadmill, bicycling, low-impact aerobics, swimming, etc. b. Save the most intensive and strenuous  activity for last such as sit-ups, heavy lifting, contact sports, etc  Refrain from any heavy lifting or straining until you are off narcotics for pain control.   c. DO NOT PUSH THROUGH PAIN.  Let pain be your guide: If it hurts to do something, don't do it.  Pain is your body warning you to avoid that activity for another week until the pain goes down. d. You may drive when you are no longer taking prescription pain medication, you can comfortably sit for long periods of time, and you can safely maneuver your car and apply brakes. e. Walter [name] may have sexual intercourse when it is comfortable.  7. FOLLOW UP in our office a. Please call [facility] at [phone] to set up an appointment to see your surgeon in the office for a follow-up appointment approximately 2 weeks after your surgery. b. Make sure that you call for this appointment the day you arrive home to insure a convenient appointment time. 10. IF YOU HAVE DISABILITY OR FAMILY  LEAVE FORMS, BRING THEM TO THE OFFICE FOR PROCESSING.  DO NOT GIVE THEM TO YOUR DOCTOR.        WHEN TO CALL us [phone]: 1. Poor pain control 2. Reactions / problems with new medications (rash/itching, nausea, etc)  3. Fever over 101.5 F (38.5 C) 4. Inability to urinate 5. Nausea and/or vomiting 6. Worsening swelling or bruising 7. Continued bleeding from incision. 8. Increased pain, redness, or drainage from the incision  The clinic staff is available to answer your questions during regular business hours (8:30am-5pm).  Please don't hesitate to call and ask to speak to one of our nurses for clinical concerns.   A surgeon from [facility] is always on call at the hospitals   If you have a medical emergency, go to the nearest emergency room or call 911.    [facility], [address], [place], [place], [place]  [address] ? MAIN: [phone] ? TOLL FREE: [phone] ? FAX [phone] [email or web address]  HEMORRHOIDS  The rectum is the last foot of your colon, and it naturally stretches to hold stool.  Hemorrhoidal piles are natural clusters of blood vessels that help the rectum and anal canal stretch to hold stool and allow bowel movements to eliminate feces.   Hemorrhoids are abnormally swollen blood vessels in the rectum.  Too much pressure in the rectum causes hemorrhoids by forcing blood to stretch and bulge the walls of the veins, sometimes even rupturing them.  Hemorrhoids can become like varicose veins you might see on a person's legs.  Most people will develop a flare of hemorrhoids in their lifetime.  When bulging hemorrhoidal veins are irritated, they can swell, burn, itch, cause pain, and bleed.  Most flares will calm down gradually own within a few weeks.  However, once hemorrhoids are created, they are difficult to get rid of completely and tend to flare more easily than the first flare.   Fortunately, good habits  and simple medical treatment usually control hemorrhoids well, and surgery is needed only in severe cases. Types of Hemorrhoids:  Internal hemorrhoids usually don't initially hurt or itch; they are deep inside the rectum and usually have no sensation. If they begin to push out (prolapse), pain and burning can occur.  However, internal hemorrhoids can bleed.  Anal bleeding should not be ignored since bleeding could come from a dangerous source like colorectal cancer, so persistent rectal bleeding should be investigated by a doctor,  sometimes with a colonoscopy.  External hemorrhoids cause most of the symptoms - pain, burning, and itching. Nonirritated hemorrhoids can look like small skin tags coming out of the anus.   Thrombosed hemorrhoids can form when a hemorrhoid blood vessel bursts and causes the hemorrhoid to suddenly swell.  A purple blood clot can form in it and become an excruciatingly painful lump at the anus. Because of these unpleasant symptoms, immediate incision and drainage by a surgeon at an office visit can provide much relief of the pain.    PREVENTION Avoiding the most frequent causes listed below will prevent most cases of hemorrhoids: Constipation Hard stools Diarrhea  Constant sitting  Straining with bowel movements Sitting on the toilet for a long time  Severe coughing  episodes Pregnancy / Childbirth  Heavy Lifting  Sometimes avoiding the above triggers is difficult:  How can you avoid sitting all day if you have a seated job? Also, we try to avoid coughing and diarrhea, but sometimes it's beyond your control.  Still, there are some practical hints to help: Keep the anal and genital area clean.  Moistened tissues such as flushable wet wipes are less irritating than toilet paper.  Using irrigating showers or bottle irrigation washing gently cleans this sensitive area.   Avoid dry toilet paper when cleaning after bowel movements.  Marland Kitchen Keep the anal and genital area dry.  Lightly  pat the rectal area dry.  Avoid rubbing.  Talcum or baby powders can help GET YOUR STOOLS SOFT.   This is the most important way to prevent irritated hemorrhoids.  Hard stools are like sandpaper to the anorectal canal and will cause more problems.  The goal: ONE SOFT BOWEL MOVEMENT A DAY!  BMs from every other day to 3 times a day is a tolerable range Treat coughing, diarrhea and constipation early since irritated hemorrhoids may soon follow.  If your main job activity is seated, always stand or walk during your breaks. Make it a point to stand and walk at least 5 minutes every hour and try to shift frequently in your chair to avoid direct rectal pressure.  Always exhale as you strain or lift. Don't hold your breath.  Do not delay or try to prevent a bowel movement when the urge is present. Exercise regularly (walking or jogging 60 minutes a day) to stimulate the bowels to move. No reading or other activity while on the toilet. If bowel movements take longer than 5 minutes, you are too constipated. AVOID CONSTIPATION Drink plenty of liquids (1 1/2 to 2 quarts of water and other fluids a day unless fluid restricted for another medical condition). Liquids that contain caffeine (coffee a, tea, soft drinks) can be dehydrating and should be avoided until constipation is controlled. Consider minimizing milk, as dairy products may be constipating. Eat plenty of fiber (30g a day ideal, more if needed).  Fiber is the undigested part of plant food that passes into the colon, acting as "natures broom" to encourage bowel motility and movement.  Fiber can absorb and hold large amounts of water. This results in a larger, bulkier stool, which is soft and easier to pass.  Eating foods high in fiber - 12 servings - such as  Vegetables: Root (potatoes, carrots, turnips), Leafy green (lettuce, salad greens, celery, spinach), High residue (cabbage, broccoli, etc.) Fruit: Fresh, Dried (prunes, apricots, cherries), Stewed  (applesauce)  Whole grain breads, pasta, whole wheat Bran cereals, muffins, etc. Consider adding supplemental bulking fiber which retains large volumes  of water: Psyllium ground seeds (native plant from [place])--available as Metamucil, Konsyl, Effersyllium, Per Diem Fiber, or the less expensive generic forms.  Citrucel  (methylcellulose wood fiber) . FiberCon (Polycarbophil) Polyethylene Glycol - and "artificial" fiber commonly called Miralax or Glycolax.  It is helpful for people with gassy or bloated feelings with regular fiber Flax Seed - a less gassy natural fiber  Laxatives can be useful for a short period if constipation is severe Osmotics (Milk of Magnesia, Fleets Phospho-Soda, Magnesium Citrate)  Stimulants (Senokot,   Castor Oil,  Dulcolax, Ex-Lax)    Laxatives are not a good long-term solution as it can stress the bowels and cause too much mineral loss and dehydration.   Avoid taking laxatives for more than 7 days in a row.  AVOID DIARRHEA Switch to liquids and simpler foods for a few days to avoid stressing your intestines further. Avoid dairy products (especially milk & ice cream) for a short time.  The intestines often can lose the ability to digest lactose when stressed. Avoid foods that cause gassiness or bloating.  Typical foods include beans and other legumes, cabbage, broccoli, and dairy foods.  Every person has some sensitivity to other foods, so listen to your body and avoid those foods that trigger problems for you. Adding fiber (Citrucel, Metamucil, FiberCon, Flax seed, Miralax) gradually can help thicken stools by absorbing excess fluid and retrain the intestines to act more normally.  Slowly increase the dose over a few weeks.  Too much fiber too soon can backfire and cause cramping & bloating. Probiotics (such as active yogurt, Align, etc) may help repopulate the intestines and colon with normal bacteria and calm down a sensitive digestive tract.  Most studies show  it to be of mild help, though, and such products can be costly. Medicines: Bismuth subsalicylate (ex. Kayopectate, Pepto Bismol) every 30 minutes for up to 6 doses can help control diarrhea.  Avoid if pregnant. Loperamide (Immodium) can slow down diarrhea.  Start with two tablets (4mg  total) first and then try one tablet every 6 hours.  Avoid if you are having fevers or severe pain.  If you are not better or start feeling worse, stop all medicines and call your doctor for advice Call your doctor if you are getting worse or not better.  Sometimes further testing (cultures, endoscopy, X-ray studies, bloodwork, etc) may be needed to help diagnose and treat the cause of the diarrhea.  TROUBLESHOOTING IRREGULAR BOWELS 1) Avoid extremes of bowel movements (no bad constipation/diarrhea) 2) Miralax 17gm mixed in 8oz. water or juice-daily. May use BID as needed.  3) Gas-x,Phazyme, etc. as needed for gas & bloating.  4) Soft,bland diet. No spicy,greasy,fried foods.  5) Prilosec over-the-counter as needed  6) May hold gluten/wheat products from diet to see if symptoms improve.  7)  May try probiotics (Align, Activa, etc) to help calm the bowels down 7) If symptoms become worse call back immediately.   TREATMENT OF HEMORRHOID FLARE If these preventive measures fail, you must take action right away! Hemorrhoids are one condition that can be mild in the morning and become intolerable by nightfall. Most hemorrhoidal flares take several weeks to calm down.  These suggestions can help: Warm soaks.  This helps more than any topical medication.  Use up to 8 times a day.  Usually sitz baths or sitting in a warm bathtub helps.  Sitting on moist warm towels are helpful.  Switching to ice packs/cool compresses can be helpful  Use a Sitz  Bath 4-8 times a day for relief A sitz bath is a warm water bath taken in the sitting position that covers only the hips and buttocks. It may be used for either healing or hygiene  purposes. Sitz baths are also used to relieve pain, itching, or muscle spasms. The water may contain medicine. Moist heat will help you heal and relax.  HOME CARE INSTRUCTIONS  Take 3 to 4 sitz baths a day. 6. Fill the bathtub half full with warm water. 7. Sit in the water and open the drain a little. 8. Turn on the warm water to keep the tub half full. Keep the water running constantly. 9. Soak in the water for 15 to 20 minutes. 10. After the sitz bath, pat the affected area dry first. SEEK MEDICAL CARE IF:  You get worse instead of better. Stop the sitz baths if you get worse.  Normalize your bowels.  Extremes of diarrhea or constipation will make hemorrhoids worse.  One soft bowel movement a day is the goal.  Fiber can help get your bowels regular Wet wipes instead of toilet paper Pain control with a NSAID such as ibuprofen (Advil) or naproxen (Aleve) or acetaminophen (Tylenol) around the clock.  Narcotics are constipating and should be minimized if possible Topical creams contain steroids (bydrocortisone) or local anesthetic (xylocaine) can help make pain and itching more tolerable.   EVALUATION If hemorrhoids are still causing problems, you could benefit by an evaluation by a surgeon.  The surgeon will obtain a history and examine you.  If hemorrhoids are diagnosed, some therapies can be offered in the office, usually with an anoscope into the less sensitive area of the rectum: -injection of hemorrhoids (sclerotherapy) can scar the blood vessels of the swollen/enlarged hemorrhoids to help shrink them down to a more normal size -rubber banding of the enlarged hemorrhoids to help shrink them down to a more normal size -drainage of the blood clot causing a thrombosed hemorrhoid,  to relieve the severe pain   While 90% of the time such problems from hemorrhoids can be managed without preceding to surgery, sometimes the hemorrhoids require a operation to control the problem (uncontrolled  bleeding, prolapse, pain, etc.).   This involves being placed under general anesthesia where the surgeon can confirm the diagnosis and remove, suture, or staple the hemorrhoid(s).  Your surgeon can help you treat the problem appropriately.      Patient Active Problem List   Diagnosis Date Noted  . Hallux valgus of right foot 03/26/2014  . History of colon cancer 01/27/2014  . IRITIS 12/17/2008  . 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 BIGEMINY 04/21/2008  . Diabetes mellitus type II, uncontrolled ([place]) 04/23/2007  . Hyperlipidemia 04/23/2007  . Obstructive sleep apnea 04/23/2007  . Essential hypertension 04/23/2007    Past Medical History  Diagnosis Date  . Hypertension   . Hyperlipidemia   . Diabetes mellitus   . History of shingles   . Colon cancer 1992    colon resection 1992  . Sleep apnea     cpap    Past Surgical History  Procedure Laterality Date  . Colectomy  1992  . Appendectomy  1949    Social History   Social History  . Marital Status: Married    Spouse Name: N/A  . Number of Children: N/A  . Years of Education: N/A   Occupational History  . Not on file.   Social History Main Topics  . Smoking  status: Never Smoker   . Smokeless tobacco: Never Used     Comment: smoked for less that 6 months in 1960  . Alcohol Use: 1.2 oz/week    2 Cans of beer per week     Comment: 2 cans previously, cut out now to help with diabetes control. rare  . Drug Use: No  . Sexual Activity: Not on file   Other Topics Concern  . Not on file   Social History Narrative   Married 1966. 2 daughters Sharee Pimple divorced lives in [place] works for [employer] no kids and Mateo Flow never married in [place], [place] working for [employer] for [employer] improvement no kids.       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)      No religious beliefs affecting health care, no  afterlife beliefs    Family History  Problem Relation Age of Onset  . Cancer      colon/fhx  . Colon cancer Father 17  . Rectal cancer Maternal Uncle   . Colon cancer Paternal Grandmother 57  . Diabetes Father     type I  . Alzheimer's disease Mother     Current Outpatient Prescriptions  Medication Sig Dispense Refill  . amLODipine (NORVASC) 5 MG tablet Take 1 tablet (5 mg total) by mouth daily. 90 tablet 3  . aspirin 81 MG tablet Take 81 mg by mouth daily.    Marland Kitchen atorvastatin (LIPITOR) 20 MG tablet Take 1 pill by mouth once per week 12 tablet 3  . CHROMIUM PICOLINATE PO Take 200 mg by mouth daily.    Marland Kitchen CINNAMON PO Take 200 mg by mouth.    Marland Kitchen glimepiride (AMARYL) 2 MG tablet Take 1 tablet (2 mg total) by mouth daily before breakfast. 90 tablet 1  . glucose blood (ONETOUCH VERIO) test strip Test once daily. 100 each 12  . LIFESCAN FINEPOINT LANCETS MISC Once daily 100 each 5  . Lutein 20 MG CAPS Take 20 mg by mouth daily.    . Magnesium 200 MG TABS Take 200 mg by mouth daily.    . metFORMIN (GLUCOPHAGE) 1000 MG tablet Take 1,000 mg by mouth 2 (two) times daily with a meal.    . Multiple Vitamins-Minerals (CENTRUM SILVER PO) Take by mouth daily.    . Omega-3 Fatty Acids (FISH OIL PO) Take 1,000 mg by mouth daily.    . saw palmetto 160 MG capsule Take 160 mg by mouth 2 (two) times daily.    Marland Kitchen telmisartan (MICARDIS) 40 MG tablet Take 1 tablet (40 mg total) by mouth daily. 90 tablet 3  . vitamin E 400 UNIT capsule Take 400 Units by mouth daily.     No current facility-administered medications for this visit.   Facility-Administered Medications Ordered in Other Visits  Medication Dose Route Frequency Provider Last Rate Last Dose  . pneumococcal 13-valent conjugate vaccine (PREVNAR 13) injection 0.5 mL  0.5 mL Intramuscular Tomorrow-1000 Ricard Dillon, MD         No Known Allergies  @VS @  No results found.  Note: This dictation was prepared with Dragon/digital dictation along  with [employer]. Any transcriptional errors that result from this process are unintentional.

## 2015-03-18 NOTE — Progress Notes (Signed)
PHARMACIST - PHYSICIAN COMMUNICATION DR:  Johney Maine  CONCERNING:  METFORMIN SAFE ADMINISTRATION POLICY  RECOMMENDATION: Metformin has been placed on DISCONTINUE (rejected order) STATUS and should be reordered only after any of the conditions below are ruled out.  Current safety recommendations include avoiding metformin for a minimum of 48 hours after the patient's exposure to intravenous contrast media.  DESCRIPTION:  The Pharmacy Committee has adopted a policy that restricts the use of metformin in hospitalized patients until all the following conditions have been ruled out.  Specific contraindications are:  _0  eGFR is below 30 ml/minute _1  Planned administration of intravenous iodinated contrast media _2  Acute or chronic metabolic acidosis (including DKA)  _3  Shock, acute MI, sepsis, hypoxemia, dehydration _4  Heart Failure patients with low EF       Peggyann Juba, PharmD, BCPS Pager: [phone] 03/18/2015 2:12 PM

## 2015-03-18 NOTE — ED Notes (Signed)
Pt can go at to floor at  [phone]

## 2015-03-19 LAB — GLUCOSE, CAPILLARY
GLUCOSE-CAPILLARY: 204 mg/dL — AB (ref 65–99)
GLUCOSE-CAPILLARY: 203 mg/dL — AB (ref 65–99)
Glucose-Capillary: 183 mg/dL — ABNORMAL HIGH (ref 65–99)

## 2015-03-19 LAB — URINE CULTURE
Culture: NO GROWTH
SPECIAL REQUESTS: NORMAL

## 2015-03-19 LAB — BASIC METABOLIC PANEL
ANION GAP: 6 (ref 5–15)
BUN: 20 mg/dL (ref 6–20)
CALCIUM: 8.4 mg/dL — AB (ref 8.9–10.3)
CO2: 30 mmol/L (ref 22–32)
CREATININE: 1.06 mg/dL (ref 0.61–1.24)
Chloride: 100 mmol/L — ABNORMAL LOW (ref 101–111)
Glucose, Bld: 254 mg/dL — ABNORMAL HIGH (ref 65–99)
Potassium: 3.7 mmol/L (ref 3.5–5.1)
Sodium: 136 mmol/L (ref 135–145)

## 2015-03-19 MED ORDER — METFORMIN HCL 500 MG PO TABS
1000.0000 mg | ORAL_TABLET | Freq: Two times a day (BID) | ORAL | Status: DC
  Administered 2015-03-19: 1000 mg via ORAL
  Filled 2015-03-19 (×3): qty 2

## 2015-03-19 MED ORDER — MAGNESIUM 200 MG PO TABS: 200.0000 mg | ORAL_TABLET | Freq: Every day | ORAL | Status: DC

## 2015-03-19 MED ORDER — VITAMIN E 180 MG (400 UNIT) PO CAPS
400.0000 [IU] | ORAL_CAPSULE | Freq: Every day | ORAL | Status: DC
  Administered 2015-03-19: 400 [IU] via ORAL
  Filled 2015-03-19: qty 1

## 2015-03-19 MED ORDER — HYDROCORTISONE 2.5 % RE CREA: 1.0000 "application " | TOPICAL_CREAM | Freq: Four times a day (QID) | RECTAL | Status: DC | PRN

## 2015-03-19 MED ORDER — OXYCODONE HCL 5 MG PO TABS: 5.0000 mg | ORAL_TABLET | ORAL | Status: DC | PRN

## 2015-03-19 MED ORDER — TAMSULOSIN HCL 0.4 MG PO CAPS: 0.4000 mg | ORAL_CAPSULE | Freq: Two times a day (BID) | ORAL | Status: DC

## 2015-03-19 NOTE — Progress Notes (Signed)
Assessment unchanged. Pt and wife verbalized understanding of dc instructions through teach back including care of foley catheter and how to switch to leg bag and back to large foley for night use. Understands follow up care and when to call the doctor. Pt to have foley removed Monday in general surgery office. Script x 1 given as provided by MD. Discharged via wc to front entrance to meet awaiting vehicle to carry home. Accompanied by wife and Passenger transport manager.

## 2015-03-19 NOTE — Discharge Instructions (Signed)
Return to [place] office to have urinary catheter removed next Monday 21 November.  Continue flomax prostate medication to help avoid future problems with urination  [facility] appointment to see Dr Johney Maine in 3 weeks  Foley Catheter Care, Adult A Foley catheter is a soft, flexible tube that is placed into the bladder to drain urine. A Foley catheter may be inserted if:  You leak urine or are not able to control when you urinate (urinary incontinence).  You are not able to urinate when you need to (urinary retention).  You had prostate surgery or surgery on the genitals.  You have certain medical conditions, such as multiple sclerosis, dementia, or a spinal cord injury. If you are going home with a Foley catheter in place, follow the instructions below. TAKING CARE OF THE CATHETER  Wash your hands with soap and water.  Using mild soap and warm water on a clean washcloth:  Clean the area on your body closest to the catheter insertion site using a circular motion, moving away from the catheter. Never wipe toward the catheter because this could sweep bacteria up into the urethra and cause infection.  Remove all traces of soap. Pat the area dry with a clean towel. For males, reposition the foreskin.  Attach the catheter to your leg so there is no tension on the catheter. Use adhesive tape or a leg strap. If you are using adhesive tape, remove any sticky residue left behind by the previous tape you used.  Keep the drainage bag below the level of the bladder, but keep it off the floor.  Check throughout the day to be sure the catheter is working and urine is draining freely. Make sure the tubing does not become kinked.  Do not pull on the catheter or try to remove it. Pulling could damage internal tissues. TAKING CARE OF THE DRAINAGE BAGS You will be given two drainage bags to take home. One is a large overnight drainage bag, and the other is a smaller leg bag that fits underneath clothing. You may  wear the overnight bag at any time, but you should never wear the smaller leg bag at night. Follow the instructions below for how to empty, change, and clean your drainage bags. Emptying the Drainage Bag You must empty your drainage bag when it is  - full or at least 2-3 times a day.  Wash your hands with soap and water.  Keep the drainage bag below your hips, below the level of your bladder. This stops urine from going back into the tubing and into your bladder.  Hold the dirty bag over the toilet or a clean container.  Open the pour spout at the bottom of the bag and empty the urine into the toilet or container. Do not let the pour spout touch the toilet, container, or any other surface. Doing so can place bacteria on the bag, which can cause an infection.  Clean the pour spout with a gauze pad or cotton ball that has rubbing alcohol on it.  Close the pour spout.  Attach the bag to your leg with adhesive tape or a leg strap.  Wash your hands well. Changing the Drainage Bag Change your drainage bag once a month or sooner if it starts to smell bad or look dirty. Below are steps to follow when changing the drainage bag.  Wash your hands with soap and water.  Pinch off the rubber catheter so that urine does not spill out.  Disconnect the catheter  tube from the drainage tube at the connection valve. Do not let the tubes touch any surface.  Clean the end of the catheter tube with an alcohol wipe. Use a different alcohol wipe to clean the end of the drainage tube.  Connect the catheter tube to the drainage tube of the clean drainage bag.  Attach the new bag to the leg with adhesive tape or a leg strap. Avoid attaching the new bag too tightly.  Wash your hands well. Cleaning the Drainage Bag  Wash your hands with soap and water.  Wash the bag in warm, soapy water.  Rinse the bag thoroughly with warm water.  Fill the bag with a solution of white vinegar and water (1 cup vinegar  to 1 qt warm water [.2 L vinegar to 1 L warm water]). Close the bag and soak it for 30 minutes in the solution.  Rinse the bag with warm water.  Hang the bag to dry with the pour spout open and hanging downward.  Store the clean bag (once it is dry) in a clean plastic bag.  Wash your hands well. PREVENTING INFECTION  Wash your hands before and after handling your catheter.  Take showers daily and wash the area where the catheter enters your body. Do not take baths. Replace wet leg straps with dry ones, if this applies.  Do not use powders, sprays, or lotions on the genital area. Only use creams, lotions, or ointments as directed by your caregiver.  For females, wipe from front to back after each bowel movement.  Drink enough fluids to keep your urine clear or pale yellow unless you have a fluid restriction.  Do not let the drainage bag or tubing touch or lie on the floor.  Wear cotton underwear to absorb moisture and to keep your skin drier. SEEK MEDICAL CARE IF:   Your urine is cloudy or smells unusually bad.  Your catheter becomes clogged.  You are not draining urine into the bag or your bladder feels full.  Your catheter starts to leak. SEEK IMMEDIATE MEDICAL CARE IF:   You have pain, swelling, redness, or pus where the catheter enters the body.  You have pain in the abdomen, legs, lower back, or bladder.  You have a fever.  You see blood fill the catheter, or your urine is pink or red.  You have nausea, vomiting, or chills.  Your catheter gets pulled out. MAKE SURE YOU:   Understand these instructions.  Will watch your condition.  Will get help right away if you are not doing well or get worse.   This information is not intended to replace advice given to you by your health care provider. Make sure you discuss any questions you have with your health care provider.   Document Released: 04/18/2005 Document Revised: 09/02/2013 Document Reviewed:  04/09/2012 Elsevier Interactive Patient Education 2016 [employer].  Diabetes Mellitus and Food It is important for you to manage your blood sugar (glucose) level. Your blood glucose level can be greatly affected by what you eat. Eating healthier foods in the appropriate amounts throughout the day at about the same time each day will help you control your blood glucose level. It can also help slow or prevent worsening of your diabetes mellitus. Healthy eating may even help you improve the level of your blood pressure and reach or maintain a healthy weight.  General recommendations for healthful eating and cooking habits include:  Eating meals and snacks regularly. Avoid going long periods  of time without eating to lose weight.  Eating a diet that consists mainly of plant-based foods, such as fruits, vegetables, nuts, legumes, and whole grains.  Using low-heat cooking methods, such as baking, instead of high-heat cooking methods, such as deep frying. Work with your dietitian to make sure you understand how to use the Nutrition Facts information on food labels. HOW CAN FOOD AFFECT ME? Carbohydrates Carbohydrates affect your blood glucose level more than any other type of food. Your dietitian will help you determine how many carbohydrates to eat at each meal and teach you how to count carbohydrates. Counting carbohydrates is important to keep your blood glucose at a healthy level, especially if you are using insulin or taking certain medicines for diabetes mellitus. Alcohol Alcohol can cause sudden decreases in blood glucose (hypoglycemia), especially if you use insulin or take certain medicines for diabetes mellitus. Hypoglycemia can be a life-threatening condition. Symptoms of hypoglycemia (sleepiness, dizziness, and disorientation) are similar to symptoms of having too much alcohol.  If your health care provider has given you approval to drink alcohol, do so in moderation and use the following  guidelines:  Women should not have more than one drink per day, and men should not have more than two drinks per day. One drink is equal to:  12 oz of beer.  5 oz of wine.  1 oz of hard liquor.  Do not drink on an empty stomach.  Keep yourself hydrated. Have water, diet soda, or unsweetened iced tea.  Regular soda, juice, and other mixers might contain a lot of carbohydrates and should be counted. WHAT FOODS ARE NOT RECOMMENDED? As you make food choices, it is important to remember that all foods are not the same. Some foods have fewer nutrients per serving than other foods, even though they might have the same number of calories or carbohydrates. It is difficult to get your body what it needs when you eat foods with fewer nutrients. Examples of foods that you should avoid that are high in calories and carbohydrates but low in nutrients include:  Trans fats (most processed foods list trans fats on the Nutrition Facts label).  Regular soda.  Juice.  Candy.  Sweets, such as cake, pie, doughnuts, and cookies.  Fried foods. WHAT FOODS CAN I EAT? Eat nutrient-rich foods, which will nourish your body and keep you healthy. The food you should eat also will depend on several factors, including:  The calories you need.  The medicines you take.  Your weight.  Your blood glucose level.  Your blood pressure level.  Your cholesterol level. You should eat a variety of foods, including:  Protein.  Lean cuts of meat.  Proteins low in saturated fats, such as fish, egg whites, and beans. Avoid processed meats.  Fruits and vegetables.  Fruits and vegetables that may help control blood glucose levels, such as apples, mangoes, and yams.  Dairy products.  Choose fat-free or low-fat dairy products, such as milk, yogurt, and cheese.  Grains, bread, pasta, and rice.  Choose whole grain products, such as multigrain bread, whole oats, and brown rice. These foods may help control  blood pressure.  Fats.  Foods containing healthful fats, such as nuts, avocado, olive oil, canola oil, and fish. DOES EVERYONE WITH DIABETES MELLITUS HAVE THE SAME MEAL PLAN? Because every person with diabetes mellitus is different, there is not one meal plan that works for everyone. It is very important that you meet with a dietitian who will help you create  a meal plan that is just right for you.   This information is not intended to replace advice given to you by your health care provider. Make sure you discuss any questions you have with your health care provider.   Document Released: 01/13/2005 Document Revised: 05/09/2014 Document Reviewed: 03/15/2013 Elsevier Interactive Patient Education 2016 [place]:  POST OPERATIVE INSTRUCTIONS  1. Take your usually prescribed home medications unless otherwise directed. 2. DIET: Follow a light bland diet the first 24 hours after arrival home, such as soup, liquids, crackers, etc.  Be sure to include lots of fluids daily.  Avoid fast food or heavy meals as your are more likely to get nauseated.  Eat a low fat the next few days after surgery.   3. PAIN CONTROL: a. Pain is best controlled by a usual combination of three different methods TOGETHER: i. Ice/Heat ii. Over the counter pain medication iii. Prescription pain medication b. Most patients will experience some swelling and discomfort in the anus/rectal area. and incisions.  Ice packs or heat (30-60 minutes up to 6 times a day) will help. Use ice for the first few days to help decrease swelling and bruising, then switch to heat such as warm towels, sitz baths, warm baths, etc to help relax tight/sore spots and speed recovery.  Some people prefer to use ice alone, heat alone, alternating between ice & heat.  Experiment to what works for you.  Swelling and bruising can take several weeks to resolve.   c. It is helpful to take an over-the-counter pain medication regularly  for the first few weeks.  Choose one of the following that works best for you: i. Naproxen (Aleve, etc)  Two 220mg  tabs twice a day ii. Ibuprofen (Advil, etc) Three 200mg  tabs four times a day (every meal & bedtime) iii. Acetaminophen (Tylenol, etc) 500-650mg  four times a day (every meal & bedtime) d. A  prescription for pain medication (such as oxycodone, hydrocodone, etc) should be given to you upon discharge.  Take your pain medication as prescribed.  i. If you are having problems/concerns with the prescription medicine (does not control pain, nausea, vomiting, rash, itching, etc), please call us [phone] to see if we need to switch you to a different pain medicine that will work better for you and/or control your side effect better. ii. If you need a refill on your pain medication, please contact your pharmacy.  They will contact our office to request authorization. Prescriptions will not be filled after 5 pm or on week-ends.  Use a Sitz Bath 4-8 times a day for relief   [employer] A sitz bath is a warm water bath taken in the sitting position that covers only the hips and buttocks. It may be used for either healing or hygiene purposes. Sitz baths are also used to relieve pain, itching, or muscle spasms. The water may contain medicine. Moist heat will help you heal and relax.  HOME CARE INSTRUCTIONS  Take 3 to 4 sitz baths a day.  Fill the bathtub half full with warm water.  Sit in the water and open the drain a little.  Turn on the warm water to keep the tub half full. Keep the water running constantly.  Soak in the water for 15 to 20 minutes.  After the sitz bath, pat the affected area dry first.   4. KEEP YOUR BOWELS REGULAR a. The goal is one bowel movement a day b. Avoid getting constipated.  Between the surgery and the pain medications, it is common to experience some constipation.  Increasing fluid intake and taking a fiber supplement (such as Metamucil, Citrucel,  FiberCon, MiraLax, etc) 1-2 times a day regularly will usually help prevent this problem from occurring.  A mild laxative (prune juice, Milk of Magnesia, MiraLax, etc) should be taken according to package directions if there are no bowel movements after 48 hours. c. Watch out for diarrhea.  If you have many loose bowel movements, simplify your diet to bland foods & liquids for a few days.  Stop any stool softeners and decrease your fiber supplement.  Switching to mild anti-diarrheal medications (Kayopectate, Pepto Bismol) can help.  If this worsens or does not improve, please call us.  5. Wound Care a. Remove your bandages the day after surgery.  Unless discharge instructions indicate otherwise, leave your bandage dry and in place overnight.  Remove the bandage during your first bowel movement.   b. Allow the wound packing to fall out over the next few days.  You can trim exposed gauze / ribbon as it falls out.  You do not need to repack the wound unless instructed otherwise.  Wear an absorbent pad or soft cotton gauze in your underwear as needed to catch any drainage and help keep the area  c. Keep the area clean and dry.  Bathe / shower every day.  Keep the area clean by showering / bathing over the incision / wound.   It is okay to soak an open wound to help wash it.  Wet wipes or showers / gentle washing after bowel movements is often less traumatic than regular toilet paper. d. Dennis Bast may have some styrofoam-like soft packing in the rectum which will come out with the first bowel movement.  e. You will often notice bleeding with bowel movements.  This should slow down by the end of the first week of surgery f. Expect some drainage.  This should slow down, too, by the end of the first week of surgery.  Wear an absorbent pad or soft cotton gauze in your underwear until the drainage stops. 6. ACTIVITIES as tolerated:   a. You may resume regular (light) daily activities beginning the next day--such as daily  self-care, walking, climbing stairs--gradually increasing activities as tolerated.  If you can walk 30 minutes without difficulty, it is safe to try more intense activity such as jogging, treadmill, bicycling, low-impact aerobics, swimming, etc. b. Save the most intensive and strenuous activity for last such as sit-ups, heavy lifting, contact sports, etc  Refrain from any heavy lifting or straining until you are off narcotics for pain control.   c. DO NOT PUSH THROUGH PAIN.  Let pain be your guide: If it hurts to do something, don't do it.  Pain is your body warning you to avoid that activity for another week until the pain goes down. d. You may drive when you are no longer taking prescription pain medication, you can comfortably sit for long periods of time, and you can safely maneuver your car and apply brakes. e. Dennis Bast may have sexual intercourse when it is comfortable.  7. FOLLOW UP in our office a. Please call [facility] at [phone] to set up an appointment to see your surgeon in the office for a follow-up appointment approximately 2 weeks after your surgery. b. Make sure that you call for this appointment the day you arrive home to insure a convenient appointment time. 10. IF YOU HAVE DISABILITY OR  FAMILY LEAVE FORMS, BRING THEM TO THE OFFICE FOR PROCESSING.  DO NOT GIVE THEM TO YOUR DOCTOR.        WHEN TO CALL us [phone]: 1. Poor pain control 2. Reactions / problems with new medications (rash/itching, nausea, etc)  3. Fever over 101.5 F (38.5 C) 4. Inability to urinate 5. Nausea and/or vomiting 6. Worsening swelling or bruising 7. Continued bleeding from incision. 8. Increased pain, redness, or drainage from the incision  The clinic staff is available to answer your questions during regular business hours (8:30am-5pm).  Please dont hesitate to call and ask to speak to one of our nurses for clinical concerns.   A surgeon from [facility] is always on call at the  hospitals   If you have a medical emergency, go to the nearest emergency room or call 911.    [facility], [place], [place], [place], [place]  [id number] ? MAIN: [phone] ? TOLL FREE: [phone] ? FAX [phone] [email or web address]  Managing Pain  Pain after surgery or related to activity is often due to strain/injury to muscle, tendon, nerves and/or incisions.  This pain is usually short-term and will improve in a few months.   Many people find it helpful to do the following things TOGETHER to help speed the process of healing and to get back to regular activity more quickly:  1. Avoid heavy physical activity at first a. No lifting greater than 20 pounds at first, then increase to lifting as tolerated over the next few weeks b. Do not push through the pain.  Listen to your body and avoid positions and maneuvers than reproduce the pain.  Wait a few days before trying something more intense c. Walking is okay as tolerated, but go slowly and stop when getting sore.  If you can walk 30 minutes without stopping or pain, you can try more intense activity (running, jogging, aerobics, cycling, swimming, treadmill, sex, sports, weightlifting, etc ) d. Remember: If it hurts to do it, then dont do it!  2. Take Acetaminophen Anti-inflammatory medication i. Acetaminophen 500mg  tabs (Tylenol) 1-2 pills with every meal and just before bedtime (avoid if you have liver problems) ii. Take with food/snack around the clock for 1-2 weeks iii. This helps the muscle and nerve tissues become less irritable and calm down faster  3. Use a Heating pad or Ice/Cold Pack a. 4-6 times a day b. May use warm bath/hottub  or showers  4. Try Gentle Massage and/or Stretching  a. at the area of pain many times a day b. stop if you feel pain - do not overdo it  Try these steps together to help you body heal faster and avoid making things get worse.  Doing just  one of these things may not be enough.    If you are not getting better after two weeks or are noticing you are getting worse, contact our office for further advice; we may need to re-evaluate you & see what other things we can do to help.

## 2015-03-19 NOTE — Discharge Summary (Signed)
Physician Discharge Summary  Patient ID: Walter [name] MRN: [id number] DOB/AGE: 1941/01/28 74 y.o.  Admit date: 03/18/2015 Discharge date: 03/19/2015  Patient Care Team: [name], MD as PCP - General (Family Medicine) Michael Boston, MD as Consulting Physician (General Surgery) Ladene Artist, MD as Consulting Physician (Gastroenterology)  Admission Diagnoses: Principal Problem:   Acute renal failure (ARF)  Active Problems:   Diabetes mellitus type II, uncontrolled ([place])   Essential hypertension   Acute urinary retention   Glucose found in urine on examination   Discharge Diagnoses:  Principal Problem:   Acute renal failure (ARF)  Active Problems:   Diabetes mellitus type II, uncontrolled ([place])   Essential hypertension   Acute urinary retention   Glucose found in urine on examination   POST-OPERATIVE DIAGNOSIS:   * No surgery found *  SURGERY:  * No surgery found *    SURGEON:      Consults: None  Hospital Course:   The patient underwent hemorrhoidectomy on Friday 11 November.  Patient struggled with some constipation despite aggressive laxatives.  Patient had ultimately with urination that became more profound.  K Nordstrom.  Foley catheter placed.  Creatinine initially 3.  This morning has normalized.  [place] is elevated above 200.  Controlled with sliding scale insulin.  Hemoglobin A1c 7.8.  He tells me last check was 7.4.  Metformin held due to elevated creatinine.  Patient had many loose bowel movements after catheter placed.  He feels much better.  Tolerating solid diet.  UA no infection  By the time of discharge, the patient was walking well the hallways, eating food, having flatus.  Pain was well-controlled on an oral medications.  Based on meeting discharge criteria and continuing to recover, I felt it was safe for the patient to be discharged from the hospital to further recover with close followup. Postoperative recommendations were discussed in detail.   They are written as well.   Significant Diagnostic Studies:  Results for orders placed or performed during the hospital encounter of 03/18/15 (from the past 72 hour(s))  Lipase, blood     Status: None   Collection Time: 03/18/15 11:26 AM  Result Value Ref Range   Lipase 19 11 - 51 U/L  Comprehensive metabolic panel     Status: Abnormal   Collection Time: 03/18/15 11:26 AM  Result Value Ref Range   Sodium 132 (L) 135 - 145 mmol/L   Potassium 3.9 3.5 - 5.1 mmol/L   Chloride 96 (L) 101 - 111 mmol/L   CO2 24 22 - 32 mmol/L   Glucose, Bld 260 (H) 65 - 99 mg/dL   BUN 43 (H) 6 - 20 mg/dL   Creatinine, Ser 3.26 (H) 0.61 - 1.24 mg/dL   Calcium 9.1 8.9 - 10.3 mg/dL   Total Protein 7.0 6.5 - 8.1 g/dL   Albumin 3.6 3.5 - 5.0 g/dL   AST 21 15 - 41 U/L   ALT 28 17 - 63 U/L   Alkaline Phosphatase 66 38 - 126 U/L   Total Bilirubin 0.6 0.3 - 1.2 mg/dL   GFR calc non Af Amer 17 (L) >60 mL/min   GFR calc Af Amer 20 (L) >60 mL/min    Comment: (NOTE) The eGFR has been calculated using the CKD EPI equation. This calculation has not been validated in all clinical situations. eGFR's persistently <60 mL/min signify possible Chronic Kidney Disease.    Anion gap 12 5 - 15  CBC     Status: Abnormal  Collection Time: 03/18/15 11:26 AM  Result Value Ref Range   WBC 11.3 (H) 4.0 - 10.5 K/uL   RBC 4.06 (L) 4.22 - 5.81 MIL/uL   Hemoglobin 13.0 13.0 - 17.0 g/dL   HCT 37.8 (L) 39.0 - 52.0 %   MCV 93.1 78.0 - 100.0 fL   MCH 32.0 26.0 - 34.0 pg   MCHC 34.4 30.0 - 36.0 g/dL   RDW 13.2 11.5 - 15.5 %   Platelets 183 150 - 400 K/uL  Urinalysis, Routine w reflex microscopic (not at [facility])     Status: Abnormal   Collection Time: 03/18/15 12:09 PM  Result Value Ref Range   Color, Urine YELLOW YELLOW   APPearance CLOUDY (A) CLEAR   Specific Gravity, Urine 1.019 1.005 - 1.030   pH 5.0 5.0 - 8.0   Glucose, UA >1000 (A) NEGATIVE mg/dL   Hgb urine dipstick SMALL (A) NEGATIVE   Bilirubin Urine NEGATIVE  NEGATIVE   Ketones, ur NEGATIVE NEGATIVE mg/dL   Protein, ur NEGATIVE NEGATIVE mg/dL   Nitrite NEGATIVE NEGATIVE   Leukocytes, UA NEGATIVE NEGATIVE  Urine microscopic-add on     Status: Abnormal   Collection Time: 03/18/15 12:09 PM  Result Value Ref Range   Squamous Epithelial / LPF 0-5 (A) NONE SEEN    Comment: Please note change in reference range.   WBC, UA 0-5 0 - 5 WBC/hpf    Comment: Please note change in reference range.   RBC / HPF 0-5 0 - 5 RBC/hpf    Comment: Please note change in reference range.   Bacteria, UA NONE SEEN NONE SEEN    Comment: Please note change in reference range.  CBG monitoring, ED     Status: Abnormal   Collection Time: 03/18/15  1:42 PM  Result Value Ref Range   Glucose-Capillary 209 (H) 65 - 99 mg/dL  Glucose, capillary     Status: Abnormal   Collection Time: 03/18/15  4:54 PM  Result Value Ref Range   Glucose-Capillary 250 (H) 65 - 99 mg/dL  Glucose, capillary     Status: Abnormal   Collection Time: 03/18/15 10:00 PM  Result Value Ref Range   Glucose-Capillary 229 (H) 65 - 99 mg/dL  Glucose, capillary     Status: Abnormal   Collection Time: 03/19/15 12:20 AM  Result Value Ref Range   Glucose-Capillary 183 (H) 65 - 99 mg/dL  Glucose, capillary     Status: Abnormal   Collection Time: 03/19/15  3:53 AM  Result Value Ref Range   Glucose-Capillary 204 (H) 65 - 99 mg/dL  Basic metabolic panel     Status: Abnormal   Collection Time: 03/19/15  5:00 AM  Result Value Ref Range   Sodium 136 135 - 145 mmol/L   Potassium 3.7 3.5 - 5.1 mmol/L   Chloride 100 (L) 101 - 111 mmol/L   CO2 30 22 - 32 mmol/L   Glucose, Bld 254 (H) 65 - 99 mg/dL   BUN 20 6 - 20 mg/dL   Creatinine, Ser 1.06 0.61 - 1.24 mg/dL    Comment: DELTA CHECK NOTED REPEATED TO VERIFY    Calcium 8.4 (L) 8.9 - 10.3 mg/dL   GFR calc non Af Amer >60 >60 mL/min   GFR calc Af Amer >60 >60 mL/min    Comment: (NOTE) The eGFR has been calculated using the CKD EPI equation. This  calculation has not been validated in all clinical situations. eGFR's persistently <60 mL/min signify possible Chronic Kidney Disease.  Anion gap 6 5 - 15    No results found.  Discharge Exam: Blood pressure 169/68, pulse 71, temperature 99.3 F (37.4 C), temperature source Oral, resp. rate 14, height 6' (1.829 m), weight 98.204 kg (216 lb 8 oz), SpO2 95 %.  General: Pt awake/alert/oriented x4 in no major acute distress Eyes: PERRL, normal EOM. Sclera nonicteric Neuro: CN II-XII intact w/o focal sensory/motor deficits. Lymph: No head/neck/groin lymphadenopathy Psych:  No delerium/psychosis/paranoia HENT: Normocephalic, Mucus membranes moist.  No thrush Neck: Supple, No tracheal deviation Chest: No pain.  Good respiratory excursion. CV:  Pulses intact.  Regular rhythm MS: Normal AROM mjr joints.  No obvious deformity Abdomen: Soft, Nondistended.  Nontender.  No incarcerated hernias. GU:  Foley in place.Clear urine Ext:  SCDs BLE.  No significant edema.  No cyanosis Skin: No petechiae / purpura  Discharged Condition: good   Past Medical History  Diagnosis Date  . Hypertension   . Hyperlipidemia   . Diabetes mellitus   . History of shingles   . Colon cancer ([place]) 1992    colon resection 1992  . Sleep apnea     cpap    Past Surgical History  Procedure Laterality Date  . Colectomy  1992  . Appendectomy  1949    Social History   Social History  . Marital Status: Married    Spouse Name: N/A  . Number of Children: N/A  . Years of Education: N/A   Occupational History  . Not on file.   Social History Main Topics  . Smoking status: Never Smoker   . Smokeless tobacco: Never Used     Comment: smoked for less that 6 months in 1960  . Alcohol Use: 1.2 oz/week    2 Cans of beer per week     Comment: 2 cans previously, cut out now to help with diabetes control. rare  . Drug Use: No  . Sexual Activity: Not on file   Other Topics Concern  . Not on file    Social History Narrative   Married 1966. 2 daughters Sharee Pimple divorced lives in [place] works for [employer] no kids and Mateo Flow never married in [place], [place] working for [employer] for [employer] improvement no kids.       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 Cancer      colon/fhx  . Colon cancer Father 39  . Rectal cancer Maternal Uncle   . Colon cancer Paternal Grandmother 37  . Diabetes Father     type I  . Alzheimer's disease Mother     Current Facility-Administered Medications  Medication Dose Route Frequency Provider Last Rate Last Dose  . acetaminophen (TYLENOL) tablet 1,000 mg  1,000 mg Oral TID Michael Boston, MD   1,000 mg at 03/18/15 2045  . alum & mag hydroxide-simeth (MAALOX/MYLANTA) 200-200-20 MG/5ML suspension 30 mL  30 mL Oral Q6H PRN Michael Boston, MD      . antiseptic oral rinse (CPC / CETYLPYRIDINIUM CHLORIDE 0.05%) solution 7 mL  7 mL Mouth Rinse q12n4p Michael Boston, MD   7 mL at 03/18/15 1800  . aspirin chewable tablet 81 mg  81 mg Oral Daily Michael Boston, MD   81 mg at 03/18/15 1518  . atorvastatin (LIPITOR) tablet 20 mg  20 mg Oral  Bushnell, MD   20 mg at 03/18/15 1732  . bismuth subsalicylate (PEPTO BISMOL) 262 MG/15ML suspension 30 mL  30 mL Oral Q8H PRN Michael Boston, MD      . chlorhexidine (PERIDEX) 0.12 % solution 15 mL  15 mL Mouth Rinse BID Michael Boston, MD   15 mL at 03/18/15 2047  . diphenhydrAMINE (BENADRYL) injection 12.5-25 mg  12.5-25 mg Intravenous Q6H PRN Michael Boston, MD      . glimepiride (AMARYL) tablet 2 mg  2 mg Oral QAC breakfast Michael Boston, MD      . heparin injection 5,000 Units  5,000 Units Subcutaneous 3 times per day Michael Boston, MD   5,000 Units at 03/19/15 [phone]  . hydrocortisone-pramoxine [facility]) 2.5-1 % rectal cream 1  application  1 application Rectal E2C PRN Michael Boston, MD      . HYDROmorphone (DILAUDID) injection 0.5-2 mg  0.5-2 mg Intravenous Q2H PRN Michael Boston, MD      . insulin aspart (novoLOG) injection 0-15 Units  0-15 Units Subcutaneous TID WC Michael Boston, MD   5 Units at 03/18/15 1733  . insulin aspart (novoLOG) injection 0-5 Units  0-5 Units Subcutaneous QHS Michael Boston, MD   0 Units at 03/18/15 2238  . iohexol (OMNIPAQUE) 300 MG/ML solution 50 mL  50 mL Oral Once PRN Charlesetta Shanks, MD   50 mL at 03/18/15 1213  . irbesartan (AVAPRO) tablet 150 mg  150 mg Oral Daily Michael Boston, MD   150 mg at 03/18/15 1519  . lip balm (CARMEX) ointment 1 application  1 application Topical BID Michael Boston, MD   1 application at 00/34/91 2047  . magic mouthwash  15 mL Oral QID PRN Michael Boston, MD   15 mL at 03/18/15 2045  . magnesium hydroxide (MILK OF MAGNESIA) suspension 30 mL  30 mL Oral Q12H PRN Michael Boston, MD      . magnesium oxide (MAG-OX) tablet 200 mg  200 mg Oral Daily Michael Boston, MD   200 mg at 03/18/15 1518  . menthol-cetylpyridinium (CEPACOL) lozenge 3 mg  1 lozenge Oral PRN Michael Boston, MD      . metFORMIN (GLUCOPHAGE) tablet 1,000 mg  1,000 mg Oral BID WC Michael Boston, MD      . metoprolol (LOPRESSOR) injection 5 mg  5 mg Intravenous Q6H PRN Michael Boston, MD      . metoprolol tartrate (LOPRESSOR) tablet 12.5 mg  12.5 mg Oral Q12H PRN Michael Boston, MD      . ondansetron [facility]) injection 4 mg  4 mg Intravenous Q6H PRN Michael Boston, MD       Or  . ondansetron (ZOFRAN) 8 mg in sodium chloride 0.9 % 50 mL IVPB  8 mg Intravenous Q6H PRN Michael Boston, MD      . ondansetron (ZOFRAN-ODT) disintegrating tablet 4-8 mg  4-8 mg Oral Q6H PRN Michael Boston, MD      . oxyCODONE (Oxy IR/ROXICODONE) immediate release tablet 5-10 mg  5-10 mg Oral Q4H PRN Michael Boston, MD   10 mg at 03/19/15 7915  . phenol (CHLORASEPTIC) mouth spray 2 spray  2 spray Mouth/Throat PRN Michael Boston, MD      . polyethylene glycol  (MIRALAX / GLYCOLAX) packet 17 g  17 g Oral BID Michael Boston, MD   17 g at 03/18/15 1600  . promethazine (PHENERGAN) injection 6.25-12.5 mg  6.25-12.5 mg Intravenous Q4H PRN Michael Boston, MD      . saccharomyces boulardii [facility]) capsule  250 mg  250 mg Oral BID Michael Boston, MD   250 mg at 03/18/15 1519  . tamsulosin (FLOMAX) capsule 0.4 mg  0.4 mg Oral BID Michael Boston, MD   0.4 mg at 03/18/15 2046  . vitamin E capsule 400 Units  400 Units Oral Daily Michael Boston, MD      . witch hazel-glycerin (TUCKS) pad 1 application  1 application Topical PRN Michael Boston, MD      . zolpidem (AMBIEN) tablet 5 mg  5 mg Oral QHS PRN Michael Boston, MD       Facility-Administered Medications Ordered in Other Encounters  Medication Dose Route Frequency Provider Last Rate Last Dose  . pneumococcal 13-valent conjugate vaccine (PREVNAR 13) injection 0.5 mL  0.5 mL Intramuscular Tomorrow-1000 Ricard Dillon, MD         No Known Allergies  Disposition: Final discharge disposition not confirmed  Discharge Instructions    Call MD for:  extreme fatigue    Complete by:  As directed      Call MD for:  hives    Complete by:  As directed      Call MD for:  persistant nausea and vomiting    Complete by:  As directed      Call MD for:  redness, tenderness, or signs of infection (pain, swelling, redness, odor or green/yellow discharge around incision site)    Complete by:  As directed      Call MD for:  severe uncontrolled pain    Complete by:  As directed      Call MD for:    Complete by:  As directed   Temperature > 101.74F     Change to leg bag    Complete by:  As directed   Teach leg bag foley care.  Patient to go home with leg bag.  Anticipate removal of leg bag in [facility] office Monday     Diet - low sodium heart healthy    Complete by:  As directed      Discharge instructions    Complete by:  As directed   Please see discharge instruction sheets.  Also refer to handout given an office.  Please call our  office if you have any questions or concerns [phone]     Discharge wound care:    Complete by:  As directed   If you have closed incisions, shower and bathe over these incisions with soap and water every day.  Remove all surgical dressings on postoperative day #3.  You do not need to replace dressings over the closed incisions unless you feel more comfortable with a Band-Aid covering it.   If you have an open wound that requires packing, please see wound care instructions.  In general, remove all dressings, wash wound with soap and water and then replace with saline moistened gauze.  Do the dressing change at least every day.  Please call our office [phone] if you have further questions.     Driving Restrictions    Complete by:  As directed   No driving until off narcotics and can safely swerve away without pain during an emergency     Increase activity slowly    Complete by:  As directed   Walk an hour a day.  Use 20-30 minute walks.  When you can walk 30 minutes without difficulty, it is fine to restart low impact/moderate activities such as biking, jogging, swimming, sexual activity, etc.  Eventually you can increase to  unrestricted activity when not feeling pain.  If you feel pain: STOP!Marland Kitchen   Let pain protect you from overdoing it.  Use ice/heat & over-the-counter pain medications to help minimize soreness.  If that is not enough, then use your narcotic pain prescription as needed to remain active.  It is better to take extra pain medications and be more active than to stay bedridden to avoid all pain medications.     Lifting restrictions    Complete by:  As directed   Avoid heavy lifting initially.  Do not push through pain.  You have no specific weight limit - if it hurts to do, DON'T DO IT.   If you feel no pain, you are not injuring anything.  Pain will protect you from injury.  Coughing and sneezing are far more stressful to your incision than any lifting.  Avoid resuming heavy  lifting / intense activity until off all narcotic pain medications.  When ready to exercise more, give yourself 2 weeks to gradually get back to full intense exercise/activity.     May shower / Bathe    Complete by:  As directed      May walk up steps    Complete by:  As directed      Sexual Activity Restrictions    Complete by:  As directed   Sexual activity as tolerated.  Do not push through pain.  Pain will protect you from injury.     Walk with assistance    Complete by:  As directed   Walk over an hour a day.  May use a walker/cane/companion to help with balance and stamina.            Medication List    STOP taking these medications        ciprofloxacin 500 MG tablet  Commonly known as:  CIPRO      TAKE these medications        amLODipine 5 MG tablet  Commonly known as:  NORVASC  Take 1 tablet (5 mg total) by mouth daily.     aspirin 81 MG tablet  Take 81 mg by mouth daily.     atorvastatin 20 MG tablet  Commonly known as:  LIPITOR  Take 1 pill by mouth once per week     CENTRUM SILVER PO  Take 1 tablet by mouth daily.     CHROMIUM PICOLINATE PO  Take 200 mg by mouth daily.     CINNAMON PO  Take 200 mg by mouth daily.     FISH OIL PO  Take 1,000 mg by mouth daily.     glimepiride 2 MG tablet  Commonly known as:  AMARYL  Take 1 tablet (2 mg total) by mouth daily before breakfast.     glucose blood test strip  Commonly known as:  ONETOUCH VERIO  Test once daily.     hydrocortisone 2.5 % rectal cream  Commonly known as:  ANUSOL-HC  Place 1 application rectally every 6 (six) hours as needed for hemorrhoids or itching.     LIFESCAN FINEPOINT LANCETS Misc  Once daily     Lutein 20 MG Caps  Take 20 mg by mouth daily.     Magnesium 200 MG Tabs  Take 200 mg by mouth daily.     metFORMIN 1000 MG tablet  Commonly known as:  GLUCOPHAGE  Take 1,000 mg by mouth 2 (two) times daily with a meal.     oxyCODONE 5 MG immediate release tablet  Commonly  known as:  Oxy IR/ROXICODONE  Take 1-2 tablets (5-10 mg total) by mouth every 4 (four) hours as needed for moderate pain or severe pain. pain     saw palmetto 160 MG capsule  Take 160 mg by mouth 2 (two) times daily.     tamsulosin 0.4 MG Caps capsule  Commonly known as:  FLOMAX  Take 1 capsule (0.4 mg total) by mouth 2 (two) times daily. 03/17/15-03/26/15     telmisartan 40 MG tablet  Commonly known as:  MICARDIS  Take 1 tablet (40 mg total) by mouth daily.     vitamin E 400 UNIT capsule  Take 400 Units by mouth daily.           Follow-up Information    Follow up with Zyon Rosser C., MD. Schedule an appointment as soon as possible for a visit in 2 weeks.   Specialty:  General Surgery   Why:  To follow up after your operation, To follow up after your hospital stay   Contact information:   [place] [address] [phone]       Follow up with [facility] In 5 days.   Specialty:  General Surgery   Why:  Return to office Monday to have your Foley bladder catheter removed   Contact information:   [address] [place] [phone]       Signed: Morton Peters, M.D., F.A.C.S. Gastrointestinal and Minimally Invasive Surgery [facility] [address], [place], [place] [address] [phone] Main / Paging   03/19/2015, 7:06 AM

## 2015-03-20 LAB — HEMOGLOBIN A1C
Hgb A1c MFr Bld: 8.5 % — ABNORMAL HIGH (ref 4.8–5.6)
Mean Plasma Glucose: 197 mg/dL

## 2015-03-29 ENCOUNTER — Inpatient Hospital Stay (HOSPITAL_COMMUNITY)
Admission: EM | Admit: 2015-03-29 | Discharge: 2015-04-02 | DRG: 872 | Disposition: A | Discharge status: Home / Self Care | Payer: Medicare Other | Attending: Internal Medicine | Admitting: Internal Medicine

## 2015-03-29 ENCOUNTER — Encounter (HOSPITAL_COMMUNITY): Payer: Self-pay | Admitting: Emergency Medicine

## 2015-03-29 ENCOUNTER — Emergency Department (HOSPITAL_COMMUNITY): Payer: Medicare Other

## 2015-03-29 DIAGNOSIS — A419 Sepsis, unspecified organism: Secondary | ICD-10-CM

## 2015-03-29 DIAGNOSIS — E118 Type 2 diabetes mellitus with unspecified complications: Secondary | ICD-10-CM | POA: Diagnosis not present

## 2015-03-29 DIAGNOSIS — E878 Other disorders of electrolyte and fluid balance, not elsewhere classified: Secondary | ICD-10-CM | POA: Diagnosis present

## 2015-03-29 DIAGNOSIS — Z7984 Long term (current) use of oral hypoglycemic drugs: Secondary | ICD-10-CM

## 2015-03-29 DIAGNOSIS — B9620 Unspecified Escherichia coli [E. coli] as the cause of diseases classified elsewhere: Secondary | ICD-10-CM | POA: Insufficient documentation

## 2015-03-29 DIAGNOSIS — E785 Hyperlipidemia, unspecified: Secondary | ICD-10-CM | POA: Diagnosis present

## 2015-03-29 DIAGNOSIS — E1169 Type 2 diabetes mellitus with other specified complication: Secondary | ICD-10-CM | POA: Diagnosis present

## 2015-03-29 DIAGNOSIS — R339 Retention of urine, unspecified: Secondary | ICD-10-CM | POA: Diagnosis present

## 2015-03-29 DIAGNOSIS — N390 Urinary tract infection, site not specified: Secondary | ICD-10-CM | POA: Diagnosis not present

## 2015-03-29 DIAGNOSIS — Z820 Family history of epilepsy and other diseases of the nervous system: Secondary | ICD-10-CM

## 2015-03-29 DIAGNOSIS — Z85038 Personal history of other malignant neoplasm of large intestine: Secondary | ICD-10-CM

## 2015-03-29 DIAGNOSIS — I152 Hypertension secondary to endocrine disorders: Secondary | ICD-10-CM | POA: Diagnosis present

## 2015-03-29 DIAGNOSIS — Z9989 Dependence on other enabling machines and devices: Secondary | ICD-10-CM

## 2015-03-29 DIAGNOSIS — E876 Hypokalemia: Secondary | ICD-10-CM | POA: Diagnosis present

## 2015-03-29 DIAGNOSIS — E1165 Type 2 diabetes mellitus with hyperglycemia: Secondary | ICD-10-CM

## 2015-03-29 DIAGNOSIS — Z833 Family history of diabetes mellitus: Secondary | ICD-10-CM

## 2015-03-29 DIAGNOSIS — E119 Type 2 diabetes mellitus without complications: Secondary | ICD-10-CM | POA: Insufficient documentation

## 2015-03-29 DIAGNOSIS — Z7982 Long term (current) use of aspirin: Secondary | ICD-10-CM

## 2015-03-29 DIAGNOSIS — R509 Fever, unspecified: Secondary | ICD-10-CM | POA: Diagnosis not present

## 2015-03-29 DIAGNOSIS — Z800 Family history of malignant neoplasm of digestive organs: Secondary | ICD-10-CM

## 2015-03-29 DIAGNOSIS — G4700 Insomnia, unspecified: Secondary | ICD-10-CM | POA: Diagnosis present

## 2015-03-29 DIAGNOSIS — N289 Disorder of kidney and ureter, unspecified: Secondary | ICD-10-CM | POA: Diagnosis present

## 2015-03-29 DIAGNOSIS — G4733 Obstructive sleep apnea (adult) (pediatric): Secondary | ICD-10-CM | POA: Diagnosis present

## 2015-03-29 DIAGNOSIS — I10 Essential (primary) hypertension: Secondary | ICD-10-CM | POA: Diagnosis not present

## 2015-03-29 DIAGNOSIS — K5900 Constipation, unspecified: Secondary | ICD-10-CM

## 2015-03-29 DIAGNOSIS — Z79899 Other long term (current) drug therapy: Secondary | ICD-10-CM

## 2015-03-29 DIAGNOSIS — E1159 Type 2 diabetes mellitus with other circulatory complications: Secondary | ICD-10-CM | POA: Diagnosis present

## 2015-03-29 HISTORY — DX: Sepsis, unspecified organism: A41.9

## 2015-03-29 LAB — URINE MICROSCOPIC-ADD ON

## 2015-03-29 LAB — CBC WITH DIFFERENTIAL/PLATELET
Basophils Absolute: 0 10*3/uL (ref 0.0–0.1)
Basophils Relative: 0 %
EOS ABS: 0 10*3/uL (ref 0.0–0.7)
Eosinophils Relative: 0 %
HEMATOCRIT: 40.8 % (ref 39.0–52.0)
HEMOGLOBIN: 13.8 g/dL (ref 13.0–17.0)
LYMPHS ABS: 0.7 10*3/uL (ref 0.7–4.0)
LYMPHS PCT: 4 %
MCH: 31.3 pg (ref 26.0–34.0)
MCHC: 33.8 g/dL (ref 30.0–36.0)
MCV: 92.5 fL (ref 78.0–100.0)
MONOS PCT: 8 %
Monocytes Absolute: 1.4 10*3/uL — ABNORMAL HIGH (ref 0.1–1.0)
NEUTROS PCT: 88 %
Neutro Abs: 15.5 10*3/uL — ABNORMAL HIGH (ref 1.7–7.7)
Platelets: 331 10*3/uL (ref 150–400)
RBC: 4.41 MIL/uL (ref 4.22–5.81)
RDW: 13 % (ref 11.5–15.5)
WBC: 17.7 10*3/uL — ABNORMAL HIGH (ref 4.0–10.5)

## 2015-03-29 LAB — COMPREHENSIVE METABOLIC PANEL
ALBUMIN: 3.5 g/dL (ref 3.5–5.0)
ALT: 58 U/L (ref 17–63)
AST: 30 U/L (ref 15–41)
Alkaline Phosphatase: 81 U/L (ref 38–126)
Anion gap: 11 (ref 5–15)
BUN: 20 mg/dL (ref 6–20)
CHLORIDE: 97 mmol/L — AB (ref 101–111)
CO2: 27 mmol/L (ref 22–32)
CREATININE: 1.24 mg/dL (ref 0.61–1.24)
Calcium: 9.4 mg/dL (ref 8.9–10.3)
GFR calc non Af Amer: 56 mL/min — ABNORMAL LOW (ref >60)
Glucose, Bld: 208 mg/dL — ABNORMAL HIGH (ref 65–99)
POTASSIUM: 4 mmol/L (ref 3.5–5.1)
SODIUM: 135 mmol/L (ref 135–145)
Total Bilirubin: 0.7 mg/dL (ref 0.3–1.2)
Total Protein: 7.3 g/dL (ref 6.5–8.1)

## 2015-03-29 LAB — LACTIC ACID, PLASMA: Lactic Acid, Venous: 1.8 mmol/L (ref 0.5–2.0)

## 2015-03-29 LAB — URINALYSIS, ROUTINE W REFLEX MICROSCOPIC
Ketones, ur: NEGATIVE mg/dL
Nitrite: POSITIVE — AB
PH: 5 (ref 5.0–8.0)
Protein, ur: 100 mg/dL — AB
SPECIFIC GRAVITY, URINE: 1.039 — AB (ref 1.005–1.030)

## 2015-03-29 LAB — I-STAT CG4 LACTIC ACID, ED
Lactic Acid, Venous: 3.31 mmol/L (ref 0.5–2.0)
Lactic Acid, Venous: 0.46 mmol/L — ABNORMAL LOW (ref 0.5–2.0)

## 2015-03-29 LAB — MAGNESIUM: Magnesium: 1.4 mg/dL — ABNORMAL LOW (ref 1.7–2.4)

## 2015-03-29 MED ORDER — TAMSULOSIN HCL 0.4 MG PO CAPS
0.4000 mg | ORAL_CAPSULE | Freq: Two times a day (BID) | ORAL | Status: DC
Start: 2015-03-29 — End: 2015-04-02
  Administered 2015-03-29 – 2015-04-02 (×8): 0.4 mg via ORAL
  Filled 2015-03-29 (×8): qty 1

## 2015-03-29 MED ORDER — ADULT MULTIVITAMIN W/MINERALS CH
1.0000 | ORAL_TABLET | Freq: Every day | ORAL | Status: DC
  Administered 2015-03-30 – 2015-04-02 (×4): 1 via ORAL
  Filled 2015-03-29 (×4): qty 1

## 2015-03-29 MED ORDER — ASPIRIN EC 81 MG PO TBEC
81.0000 mg | DELAYED_RELEASE_TABLET | Freq: Every day | ORAL | Status: DC
  Administered 2015-03-30 – 2015-04-02 (×4): 81 mg via ORAL
  Filled 2015-03-29 (×4): qty 1

## 2015-03-29 MED ORDER — HYDROCORTISONE 2.5 % RE CREA
1.0000 "application " | TOPICAL_CREAM | Freq: Four times a day (QID) | RECTAL | Status: DC | PRN
  Filled 2015-03-29: qty 28.35

## 2015-03-29 MED ORDER — ACETAMINOPHEN 500 MG PO TABS
1000.0000 mg | ORAL_TABLET | Freq: Four times a day (QID) | ORAL | Status: DC | PRN
  Administered 2015-03-31: 1000 mg via ORAL
  Filled 2015-03-29: qty 2

## 2015-03-29 MED ORDER — SAW PALMETTO (SERENOA REPENS) 160 MG PO CAPS: 160.0000 mg | ORAL_CAPSULE | Freq: Two times a day (BID) | ORAL | Status: DC

## 2015-03-29 MED ORDER — OXYCODONE HCL 5 MG PO TABS: 5.0000 mg | ORAL_TABLET | ORAL | Status: DC | PRN

## 2015-03-29 MED ORDER — ONDANSETRON HCL 4 MG/2ML IJ SOLN: 4.0000 mg | Freq: Four times a day (QID) | INTRAMUSCULAR | Status: DC | PRN

## 2015-03-29 MED ORDER — MAGNESIUM OXIDE 400 (241.3 MG) MG PO TABS
200.0000 mg | ORAL_TABLET | Freq: Every day | ORAL | Status: DC
  Filled 2015-03-29: qty 0.5

## 2015-03-29 MED ORDER — SODIUM CHLORIDE 0.9 % IV SOLN
INTRAVENOUS | Status: DC
  Administered 2015-03-29 – 2015-04-02 (×7): via INTRAVENOUS

## 2015-03-29 MED ORDER — ONDANSETRON HCL 4 MG PO TABS: 4.0000 mg | ORAL_TABLET | Freq: Four times a day (QID) | ORAL | Status: DC | PRN

## 2015-03-29 MED ORDER — DEXTROSE 5 % IV SOLN
2.0000 g | Freq: Once | INTRAVENOUS | Status: AC
  Administered 2015-03-29: 2 g via INTRAVENOUS
  Filled 2015-03-29: qty 2

## 2015-03-29 MED ORDER — ENOXAPARIN SODIUM 40 MG/0.4ML ~~LOC~~ SOLN
40.0000 mg | Freq: Every day | SUBCUTANEOUS | Status: DC
  Administered 2015-03-29 – 2015-04-01 (×4): 40 mg via SUBCUTANEOUS
  Filled 2015-03-29 (×4): qty 0.4

## 2015-03-29 MED ORDER — SODIUM CHLORIDE 0.9 % IV BOLUS (SEPSIS)
1000.0000 mL | INTRAVENOUS | Status: AC
  Administered 2015-03-29 (×3): 1000 mL via INTRAVENOUS

## 2015-03-29 MED ORDER — IRBESARTAN 150 MG PO TABS
150.0000 mg | ORAL_TABLET | Freq: Every day | ORAL | Status: DC
  Administered 2015-03-30 – 2015-04-02 (×4): 150 mg via ORAL
  Filled 2015-03-29 (×4): qty 1

## 2015-03-29 MED ORDER — ATORVASTATIN CALCIUM 20 MG PO TABS
20.0000 mg | ORAL_TABLET | Freq: Every day | ORAL | Status: DC
  Administered 2015-03-30 – 2015-03-31 (×2): 20 mg via ORAL
  Filled 2015-03-29 (×4): qty 1

## 2015-03-29 MED ORDER — DEXTROSE 5 % IV SOLN
2.0000 g | INTRAVENOUS | Status: DC
  Administered 2015-03-30: 2 g via INTRAVENOUS
  Filled 2015-03-29: qty 2

## 2015-03-29 MED ORDER — AMLODIPINE BESYLATE 5 MG PO TABS
5.0000 mg | ORAL_TABLET | Freq: Every day | ORAL | Status: DC
Start: 2015-03-30 — End: 2015-04-02
  Administered 2015-03-30 – 2015-04-02 (×4): 5 mg via ORAL
  Filled 2015-03-29 (×4): qty 1

## 2015-03-29 NOTE — ED Notes (Signed)
Patient went to xray 

## 2015-03-29 NOTE — ED Notes (Signed)
Pt states that he was here for hemorrhoid surgery 11/11.  After surgery, had problems with urinary retention.  Pt states that he had a foley catheter placed here.  Had it taken out at his surgeon's office on the 21st.  States that he was told to go home and if he did not urinate by noon, to call them.  He did not urinate so they sent him to a urologist office where they inserted another one.  Has been having fever since Wednesday.  Has been on cipro since then.  Has had nausea but no vomiting.   General malaise, no specific pain.

## 2015-03-29 NOTE — ED Notes (Signed)
I gave I Stat Lactic results to MD Zenia Resides

## 2015-03-29 NOTE — ED Provider Notes (Signed)
CSN: [id number]     Arrival date & time 03/29/15  1402 History   First MD Initiated Contact with Patient 03/29/15 1741     Chief Complaint  Patient presents with  . Fever     (Consider location/radiation/quality/duration/timing/severity/associated sxs/prior Treatment) The history is provided by the patient and medical records.   74 y.o. M with hx of HTN, HLP, DM, colon cancer, OSA, presenting to the ED for fever.  Patient had hemorrhoidectomy on 03/13/2015. After surgery having problems with urinary retention and was readmitted by general surgery service. He had Foley catheter placed that was removed the following day. He was unable to urinate, so followed up with urology and had another Foley placed. He was restarted on Ciprofloxacin on 03/25/15, however he has been running a fever.  Tmax 102.22F per wife.  Patient admits that he can sense when he begins running a fever, he states he begins feeling palpitations and gets lightheaded. He denies any confusion or changes in mental status. He states he does feel generally weak. He has continued drinking fluids, but states he has poor appetite. He denies any nausea or vomiting recently. No issues with Foley catheter itself, has been draining well. No noted hematuria.  No chest pain or SOB.   VSS.  Past Medical History  Diagnosis Date  . Hypertension   . Hyperlipidemia   . Diabetes mellitus   . History of shingles   . Colon cancer ([place]) 1992    colon resection 1992  . Sleep apnea     cpap   Past Surgical History  Procedure Laterality Date  . Colectomy  1992  . Appendectomy  1949  . Hemorrhoid surgery     Family History  Problem Relation Age of Onset  . Cancer      colon/fhx  . Colon cancer Father 33  . Rectal cancer Maternal Uncle   . Colon cancer Paternal Grandmother 50  . Diabetes Father     type I  . Alzheimer's disease Mother    Social History  Substance Use Topics  . Smoking status: Never Smoker   . Smokeless tobacco:  Never Used     Comment: smoked for less that 6 months in 1960  . Alcohol Use: 1.2 oz/week    2 Cans of beer per week     Comment: 2 cans previously, cut out now to help with diabetes control. rare    Review of Systems  Constitutional: Positive for fever.  Neurological: Positive for weakness.  All other systems reviewed and are negative.     Allergies  Review of patient's allergies indicates no known allergies.  Home Medications   Prior to Admission medications   Medication Sig Start Date End Date Taking? Authorizing Provider  acetaminophen (TYLENOL) 500 MG tablet Take 1,000 mg by mouth every 6 (six) hours as needed for moderate pain.   Yes Historical Provider, MD  amLODipine (NORVASC) 5 MG tablet Take 1 tablet (5 mg total) by mouth daily. 04/21/14  Yes [name], MD  aspirin 81 MG tablet Take 81 mg by mouth daily.   Yes Historical Provider, MD  atorvastatin (LIPITOR) 20 MG tablet Take 1 pill by mouth once per week 04/21/14  Yes [name], MD  CHROMIUM PICOLINATE PO Take 200 mg by mouth daily.   Yes Historical Provider, MD  CINNAMON PO Take 200 mg by mouth daily.    Yes Historical Provider, MD  ciprofloxacin (CIPRO) 500 MG tablet Take 500 mg by mouth 2 (  two) times daily. 11-23 to 11-30 03/25/15  Yes Historical Provider, MD  glimepiride (AMARYL) 2 MG tablet Take 1 tablet (2 mg total) by mouth daily before breakfast. 11/21/14  Yes [name], MD  hydrocortisone (ANUSOL-HC) 2.5 % rectal cream Place 1 application rectally every 6 (six) hours as needed for hemorrhoids or itching. 03/19/15  Yes Michael Boston, MD  Lutein 20 MG CAPS Take 20 mg by mouth daily.   Yes Historical Provider, MD  Magnesium 200 MG TABS Take 200 mg by mouth daily.   Yes Historical Provider, MD  metFORMIN (GLUCOPHAGE) 1000 MG tablet Take 1,000 mg by mouth 2 (two) times daily with a meal.   Yes Historical Provider, MD  Multiple Vitamins-Minerals (CENTRUM SILVER PO) Take 1 tablet by mouth daily.    Yes  Historical Provider, MD  Omega-3 Fatty Acids (FISH OIL PO) Take 1,000 mg by mouth daily.   Yes Historical Provider, MD  oxyCODONE (OXY IR/ROXICODONE) 5 MG immediate release tablet Take 1-2 tablets (5-10 mg total) by mouth every 4 (four) hours as needed for moderate pain or severe pain. pain 03/19/15  Yes Michael Boston, MD  saw palmetto 160 MG capsule Take 160 mg by mouth 2 (two) times daily.   Yes Historical Provider, MD  tamsulosin (FLOMAX) 0.4 MG CAPS capsule Take 1 capsule (0.4 mg total) by mouth 2 (two) times daily. 03/17/15-03/26/15 03/19/15  Yes Michael Boston, MD  telmisartan (MICARDIS) 40 MG tablet Take 1 tablet (40 mg total) by mouth daily. 04/21/14  Yes [name], MD  vitamin E 400 UNIT capsule Take 400 Units by mouth daily.   Yes Historical Provider, MD   BP 118/60 mmHg  Pulse 97  Temp(Src) 98 F (36.7 C) (Oral)  Resp 16  SpO2 94%   Physical Exam  Constitutional: He is oriented to person, place, and time. He appears well-developed and well-nourished.  HENT:  Head: Normocephalic and atraumatic.  Mouth/Throat: Oropharynx is clear and moist.  Eyes: Conjunctivae and EOM are normal. Pupils are equal, round, and reactive to light.  Neck: Normal range of motion.  Cardiovascular: Normal rate, regular rhythm and normal heart sounds.   Pulmonary/Chest: Effort normal and breath sounds normal.  Abdominal: Soft. Bowel sounds are normal. There is no tenderness. There is no rigidity, no guarding and no CVA tenderness.  Foley catheter in place with leg bag, clear/yellow urine noted  Musculoskeletal: Normal range of motion.  Neurological: He is alert and oriented to person, place, and time.  AAOx3, answering questions appropriately; equal strength UE and LE bilaterally; CN grossly intact; moves all extremities appropriately without ataxia; no focal neuro deficits or facial asymmetry appreciated  Skin: Skin is warm and dry.  Psychiatric: He has a normal mood and affect.  Nursing note and  vitals reviewed.   ED Course  Procedures (including critical care time) Labs Review Labs Reviewed  COMPREHENSIVE METABOLIC PANEL - Abnormal; Notable for the following:    Chloride 97 (*)    Glucose, Bld 208 (*)    GFR calc non Af Amer 56 (*)    All other components within normal limits  CBC WITH DIFFERENTIAL/PLATELET - Abnormal; Notable for the following:    WBC 17.7 (*)    Neutro Abs 15.5 (*)    Monocytes Absolute 1.4 (*)    All other components within normal limits  URINALYSIS, ROUTINE W REFLEX MICROSCOPIC (NOT AT [facility]) - Abnormal; Notable for the following:    Color, Urine AMBER (*)    APPearance CLOUDY (*)  Specific Gravity, Urine 1.039 (*)    Glucose, UA >1000 (*)    Hgb urine dipstick MODERATE (*)    Bilirubin Urine SMALL (*)    Protein, ur 100 (*)    Nitrite POSITIVE (*)    Leukocytes, UA SMALL (*)    All other components within normal limits  URINE MICROSCOPIC-ADD ON - Abnormal; Notable for the following:    Squamous Epithelial / LPF 0-5 (*)    Bacteria, UA MANY (*)    All other components within normal limits  I-STAT CG4 LACTIC ACID, ED - Abnormal; Notable for the following:    Lactic Acid, Venous 3.31 (*)    All other components within normal limits  I-STAT CG4 LACTIC ACID, ED - Abnormal; Notable for the following:    Lactic Acid, Venous 0.46 (*)    All other components within normal limits  URINE CULTURE  CULTURE, BLOOD (ROUTINE X 2)  CULTURE, BLOOD (ROUTINE X 2)    Imaging Review Dg Chest 2 View  03/29/2015  CLINICAL DATA:  Sepsis and fever. Hemorrhoid surgery 03/13/2015. Remote history of colon cancer. EXAM: CHEST - 2 VIEW COMPARISON:  None. FINDINGS: The heart size is normal. Atherosclerotic changes are noted at the aortic arch. Minimal left basilar airspace disease likely reflects atelectasis with some elevation of the left hemidiaphragm. The right lung is clear. The visualized soft tissues and bony thorax are unremarkable. IMPRESSION: 1. Minimal left  basilar airspace disease likely reflects atelectasis. Early infection is considered less likely. 2. Atherosclerotic calcifications at the aortic arch. Electronically Signed   By: San Morelle M.D.   On: 03/29/2015 19:58   I have personally reviewed and evaluated these images and lab results as part of my medical decision-making.   EKG Interpretation None      MDM   Final diagnoses:  Sepsis, due to unspecified organism [facility])  UTI (lower urinary tract infection)  Fever, unspecified fever cause   74 year old male here with fever. He has indwelling Foley secondary to urinary retention following hemorrhoidectomy earlier this month.  Patient afebrile on arrival here.  He is awake, alert, fully oriented to baseline.  Elevated lactic acid and leukocytosis noted. UA nitrite positive.  Chest x-ray without evidence of acute infiltrate, likely atelectasis-- no recent cough or SOB.  Urine and blood culture pending.  Patient has received 3L IV fluids and 2g Rocephin.  He will be admitted to medicine service for further management.  Larene Pickett, PA-C 03/29/15 2156  Lacretia Leigh, MD 03/29/15 [phone]

## 2015-03-29 NOTE — H&P (Signed)
[facility] History and Physical  Walter [name] [id number] DOB: 1940-10-06 DOA: 03/29/2015  Referring physician: Jaquita Rector PCP: [name], MD   Chief Complaint: Fever, chills, and confusion  HPI:  Patient is a 74 year old male with a past medical history significant for HTN, DM type II, HLD, colon cancer s/p resection in 1992, OSA on CPAP; who presents with complaints of fever, chills, and confusion. Patient states symptoms started the November 11th after he had a surgery for a hemorrhoidectomy. At that time, he was told by the surgeon to make sure that he was taking a stool softener and drinking plenty of water to keep his bowels regular. However, patient was unable to have a bowel movement despite following recommendations. He called back to the surgeon's office and was advised to double his stool softener. Approximately 5 days after this surgery patient had worsening difficulty with urination, still no bowel movement, and decreased urine output. He returned to the hospital and was found to have urinary retention. A Foley catheter was placed which drained per patient "1750 mL" of fluid. He was admitted overnight and DC the following morning with the foley. He was to keep the foley in place until he followed up with the surgeon on November 21. On the 21st, the  foley catheter was removed, however patient was still unable to urinate. The surgeon referred  him to a urologist who then replaced the foley catheter. They recommended that the patient keep the foley catheter in until  December 12. However, 2 days later after the foley was replaced he spiked a fever of 102.69F.  Tried to treat symptoms with over-the-counter medications with mild relief. They called the urologist to notify him and he  prescribed ciprofloxacin over the phone for presumed UTI. Also advised him to take the Flomax 2 times daily. Thereafter patient reports initially feeling better. Until 2 days ago when he again started to spikes  fever, chills, diaphoresis, and was very confused. Wife notes that he was "talking out of his head". Reportedly he even stumbled and fell on to his knees a one point. He denied any nausea, vomiting, abdominal pain, or shortness of breath. He reports that the hemorrhoidectomy site is healing well and has some mild drainage which was told to be expected. No significant redness or swelling noted around surgical site.  Upon arrival to the emergency department patient was found to have a heart rate of 108, WBC count of 17.7, lactic acid 3.3, and a positive UA.    Review of Systems  Constitutional: Positive for fever, chills and diaphoresis.  HENT: Negative for hearing loss.   Eyes: Negative for double vision and photophobia.  Respiratory: Negative for cough and hemoptysis.   Cardiovascular: Negative for chest pain and palpitations.  Gastrointestinal: Negative for nausea, vomiting and abdominal pain.  Genitourinary: Negative for hematuria and flank pain.  Musculoskeletal: Positive for falls. Negative for myalgias and neck pain.  Skin: Negative for itching and rash.  Neurological: Positive for weakness. Negative for speech change, focal weakness and headaches.  Psychiatric/Behavioral: Negative for substance abuse.       Positive for confusion     Past Medical History  Diagnosis Date  . Hypertension   . Hyperlipidemia   . Diabetes mellitus   . History of shingles   . Colon cancer ([place]) 1992    colon resection 1992  . Sleep apnea     cpap     Past Surgical History  Procedure Laterality Date  . Colectomy  1992  .  Appendectomy  1949  . Hemorrhoid surgery        Social History:  reports that he has never smoked. He has never used smokeless tobacco. He reports that he drinks about 1.2 oz of alcohol per week. He reports that he does not use illicit drugs. Where does patient live--home  and with whom if at home? Wife Can patient participate in ADLs? Yes  No Known Allergies  Family  History  Problem Relation Age of Onset  . Cancer      colon/fhx  . Colon cancer Father 18  . Rectal cancer Maternal Uncle   . Colon cancer Paternal Grandmother 18  . Diabetes Father     type I  . Alzheimer's disease Mother        Prior to Admission medications   Medication Sig Start Date End Date Taking? Authorizing Provider  acetaminophen (TYLENOL) 500 MG tablet Take 1,000 mg by mouth every 6 (six) hours as needed for moderate pain.   Yes Historical Provider, MD  amLODipine (NORVASC) 5 MG tablet Take 1 tablet (5 mg total) by mouth daily. 04/21/14  Yes [name], MD  aspirin 81 MG tablet Take 81 mg by mouth daily.   Yes Historical Provider, MD  atorvastatin (LIPITOR) 20 MG tablet Take 1 pill by mouth once per week 04/21/14  Yes [name], MD  CHROMIUM PICOLINATE PO Take 200 mg by mouth daily.   Yes Historical Provider, MD  CINNAMON PO Take 200 mg by mouth daily.    Yes Historical Provider, MD  ciprofloxacin (CIPRO) 500 MG tablet Take 500 mg by mouth 2 (two) times daily. 11-23 to 11-30 03/25/15  Yes Historical Provider, MD  glimepiride (AMARYL) 2 MG tablet Take 1 tablet (2 mg total) by mouth daily before breakfast. 11/21/14  Yes [name], MD  hydrocortisone (ANUSOL-HC) 2.5 % rectal cream Place 1 application rectally every 6 (six) hours as needed for hemorrhoids or itching. 03/19/15  Yes Michael Boston, MD  Lutein 20 MG CAPS Take 20 mg by mouth daily.   Yes Historical Provider, MD  Magnesium 200 MG TABS Take 200 mg by mouth daily.   Yes Historical Provider, MD  metFORMIN (GLUCOPHAGE) 1000 MG tablet Take 1,000 mg by mouth 2 (two) times daily with a meal.   Yes Historical Provider, MD  Multiple Vitamins-Minerals (CENTRUM SILVER PO) Take 1 tablet by mouth daily.    Yes Historical Provider, MD  Omega-3 Fatty Acids (FISH OIL PO) Take 1,000 mg by mouth daily.   Yes Historical Provider, MD  oxyCODONE (OXY IR/ROXICODONE) 5 MG immediate release tablet Take 1-2 tablets (5-10 mg  total) by mouth every 4 (four) hours as needed for moderate pain or severe pain. pain 03/19/15  Yes Michael Boston, MD  saw palmetto 160 MG capsule Take 160 mg by mouth 2 (two) times daily.   Yes Historical Provider, MD  tamsulosin (FLOMAX) 0.4 MG CAPS capsule Take 1 capsule (0.4 mg total) by mouth 2 (two) times daily. 03/17/15-03/26/15 03/19/15  Yes Michael Boston, MD  telmisartan (MICARDIS) 40 MG tablet Take 1 tablet (40 mg total) by mouth daily. 04/21/14  Yes [name], MD  vitamin E 400 UNIT capsule Take 400 Units by mouth daily.   Yes Historical Provider, MD     Physical Exam: Filed Vitals:   03/29/15 1917 03/29/15 1937 03/29/15 2030 03/29/15 2121  BP:  132/65 138/71 144/74  Pulse:  89 90 86  Temp:      TempSrc:  Resp:  16 20 16   Height: 6' (1.829 m)     Weight: 88.451 kg (195 lb)     SpO2:  94% 90% 92%     Constitutional: Vital signs reviewed. Patient is a elderly male who appears to be sick, but able to cooperate with exam. Alert and oriented x3.  Head: Normocephalic and atraumatic  Ear: TM normal bilaterally  Mouth: no erythema or exudates, MMM  Eyes: PERRL, EOMI, conjunctivae normal, No scleral icterus.  Neck: Supple, Trachea midline normal ROM, No JVD, mass, thyromegaly, or carotid bruit present.  Cardiovascular: RRR, S1 normal, S2 normal, no MRG, pulses symmetric and intact bilaterally  Pulmonary/Chest: CTAB, no wheezes, rales, or rhonchi  Abdominal: Soft. Non-tender, non-distended, bowel sounds are normal, no masses, organomegaly, or guarding present.  GU: no CVA tenderness,+ Foley catheter  Musculoskeletal: No joint deformities, erythema, or stiffness, ROM full and no nontender Ext: no edema and no cyanosis, pulses palpable bilaterally (DP and PT)  Hematology: no cervical, inginal, or axillary adenopathy.  Neurological: A&O x3, Strenght is normal and symmetric bilaterally, cranial nerve II-XII are grossly intact, no focal motor deficit, sensory intact to light  touch bilaterally.  Skin: Warm, dry and intact. No rash, cyanosis, or clubbing.  Psychiatric: Normal mood and affect. speech and behavior is normal. Judgment and thought content normal. Cognition and memory are normal.      Data Review   Micro Results No results found for this or any previous visit (from the past 240 hour(s)).  Radiology Reports Dg Chest 2 View  03/29/2015  CLINICAL DATA:  Sepsis and fever. Hemorrhoid surgery 03/13/2015. Remote history of colon cancer. EXAM: CHEST - 2 VIEW COMPARISON:  None. FINDINGS: The heart size is normal. Atherosclerotic changes are noted at the aortic arch. Minimal left basilar airspace disease likely reflects atelectasis with some elevation of the left hemidiaphragm. The right lung is clear. The visualized soft tissues and bony thorax are unremarkable. IMPRESSION: 1. Minimal left basilar airspace disease likely reflects atelectasis. Early infection is considered less likely. 2. Atherosclerotic calcifications at the aortic arch. Electronically Signed   By: San Morelle M.D.   On: 03/29/2015 19:58     CBC  Recent Labs Lab 03/29/15 1515  WBC 17.7*  HGB 13.8  HCT 40.8  PLT 331  MCV 92.5  MCH 31.3  MCHC 33.8  RDW 13.0  LYMPHSABS 0.7  MONOABS 1.4*  EOSABS 0.0  BASOSABS 0.0    Chemistries   Recent Labs Lab 03/29/15 1515  NA 135  K 4.0  CL 97*  CO2 27  GLUCOSE 208*  BUN 20  CREATININE 1.24  CALCIUM 9.4  AST 30  ALT 58  ALKPHOS 81  BILITOT 0.7   ------------------------------------------------------------------------------------------------------------------ estimated creatinine clearance is 57.4 mL/min (by C-G formula based on Cr of 1.24). ------------------------------------------------------------------------------------------------------------------ No results for input(s): HGBA1C in the last 72  hours. ------------------------------------------------------------------------------------------------------------------ No results for input(s): CHOL, HDL, LDLCALC, TRIG, CHOLHDL, LDLDIRECT in the last 72 hours. ------------------------------------------------------------------------------------------------------------------ No results for input(s): TSH, T4TOTAL, T3FREE, THYROIDAB in the last 72 hours.  Invalid input(s): FREET3 ------------------------------------------------------------------------------------------------------------------ No results for input(s): VITAMINB12, FOLATE, FERRITIN, TIBC, IRON, RETICCTPCT in the last 72 hours.  Coagulation profile No results for input(s): INR, PROTIME in the last 168 hours.  No results for input(s): DDIMER in the last 72 hours.  Cardiac Enzymes No results for input(s): CKMB, TROPONINI, MYOGLOBIN in the last 168 hours.  Invalid input(s): CK ------------------------------------------------------------------------------------------------------------------ Invalid input(s): POCBNP   CBG: No results for input(s): GLUCAP in  the last 168 hours.     EKG: Independently reviewed. 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 Patient previously failed outpatient antibiotics of ciprofloxacin. Presents with elevated heart rate of 108, temperature reported as high as 102.15F at home, WBC count 17.7, elevated lactate at 3.3, and a +UA(+LE/+Nitrites/Many Bac). Patient received at least 3 L of IV fluids while in the emergency department. At this point suspect sepsis secondary to a complicated urinary tract infection. If symptoms do not improve may also warrant further investigation into previous surgical site. -Admit to medical-surgical floor -Follow-up urine cultures -Blood cultures 2 -f/u lactic acid -NS IV fluids at 125 ml  -Continue Rocephin IV for now   -Cbc in am -Tylenol prn fever -Zofran prn Nausea  Diabetes mellitus type 2. Last hemoglobin A1c was 7.8 -Held home oral hypoglycemics Amaryl and metformin  Essential hypertension -Continue Lopressor  Renal insufficiency- patient's baseline Cr 1.06 on 03/19/2015, but slightly elevated on admission today at 1.24. BUN noted to be 20. Previous history of urinary retention requiring Foley during last hospitalization. -Recheck BMP in a.m.  Hyperlipidemia -Continue atorvastatin  OSA on CPAP: Patient reports home settings of 7 -Respiratory therapy to apply CPAP at night at settings of 7  Hypochloremia: Acute. Chloride level 97. Secondary to acute losses with fever -IV fluids of normal saline at 125  Insomnia -Zolpidem  Code Status:   full Family Communication: bedside Disposition Plan: admit   Total time spent 55 minutes.Greater than 50% of this time was spent in counseling, explanation of diagnosis, planning of further management, and coordination of care  [facility] Pager [phone]  If 7PM-7AM, please contact night-coverage [email or web address] Password [facility] 03/29/2015, 9:31 PM

## 2015-03-30 ENCOUNTER — Other Ambulatory Visit: Payer: Self-pay | Admitting: Family Medicine

## 2015-03-30 ENCOUNTER — Observation Stay (HOSPITAL_COMMUNITY): Payer: Medicare Other

## 2015-03-30 ENCOUNTER — Encounter (HOSPITAL_COMMUNITY): Payer: Self-pay

## 2015-03-30 DIAGNOSIS — Z85038 Personal history of other malignant neoplasm of large intestine: Secondary | ICD-10-CM | POA: Diagnosis not present

## 2015-03-30 DIAGNOSIS — Z800 Family history of malignant neoplasm of digestive organs: Secondary | ICD-10-CM | POA: Diagnosis not present

## 2015-03-30 DIAGNOSIS — R509 Fever, unspecified: Secondary | ICD-10-CM | POA: Diagnosis present

## 2015-03-30 DIAGNOSIS — Z833 Family history of diabetes mellitus: Secondary | ICD-10-CM | POA: Diagnosis not present

## 2015-03-30 DIAGNOSIS — E785 Hyperlipidemia, unspecified: Secondary | ICD-10-CM | POA: Diagnosis present

## 2015-03-30 DIAGNOSIS — E878 Other disorders of electrolyte and fluid balance, not elsewhere classified: Secondary | ICD-10-CM | POA: Diagnosis present

## 2015-03-30 DIAGNOSIS — A419 Sepsis, unspecified organism: Secondary | ICD-10-CM | POA: Diagnosis present

## 2015-03-30 DIAGNOSIS — G4700 Insomnia, unspecified: Secondary | ICD-10-CM | POA: Diagnosis present

## 2015-03-30 DIAGNOSIS — I10 Essential (primary) hypertension: Secondary | ICD-10-CM | POA: Diagnosis present

## 2015-03-30 DIAGNOSIS — Z820 Family history of epilepsy and other diseases of the nervous system: Secondary | ICD-10-CM | POA: Diagnosis not present

## 2015-03-30 DIAGNOSIS — N390 Urinary tract infection, site not specified: Secondary | ICD-10-CM | POA: Diagnosis present

## 2015-03-30 DIAGNOSIS — Z7982 Long term (current) use of aspirin: Secondary | ICD-10-CM | POA: Diagnosis not present

## 2015-03-30 DIAGNOSIS — Z79899 Other long term (current) drug therapy: Secondary | ICD-10-CM | POA: Diagnosis not present

## 2015-03-30 DIAGNOSIS — B9620 Unspecified Escherichia coli [E. coli] as the cause of diseases classified elsewhere: Secondary | ICD-10-CM | POA: Diagnosis present

## 2015-03-30 DIAGNOSIS — Z7984 Long term (current) use of oral hypoglycemic drugs: Secondary | ICD-10-CM | POA: Diagnosis not present

## 2015-03-30 DIAGNOSIS — N289 Disorder of kidney and ureter, unspecified: Secondary | ICD-10-CM | POA: Diagnosis present

## 2015-03-30 DIAGNOSIS — G4733 Obstructive sleep apnea (adult) (pediatric): Secondary | ICD-10-CM | POA: Diagnosis present

## 2015-03-30 DIAGNOSIS — E1165 Type 2 diabetes mellitus with hyperglycemia: Secondary | ICD-10-CM | POA: Diagnosis present

## 2015-03-30 DIAGNOSIS — E876 Hypokalemia: Secondary | ICD-10-CM | POA: Diagnosis present

## 2015-03-30 LAB — BASIC METABOLIC PANEL
Anion gap: 9 (ref 5–15)
BUN: 17 mg/dL (ref 6–20)
CALCIUM: 7.8 mg/dL — AB (ref 8.9–10.3)
CO2: 24 mmol/L (ref 22–32)
CREATININE: 0.93 mg/dL (ref 0.61–1.24)
Chloride: 97 mmol/L — ABNORMAL LOW (ref 101–111)
GFR calc Af Amer: >60 mL/min (ref >60)
GFR calc non Af Amer: >60 mL/min (ref >60)
GLUCOSE: 284 mg/dL — AB (ref 65–99)
Potassium: 3.4 mmol/L — ABNORMAL LOW (ref 3.5–5.1)
SODIUM: 130 mmol/L — AB (ref 135–145)

## 2015-03-30 LAB — CBC
HCT: 31.3 % — ABNORMAL LOW (ref 39.0–52.0)
Hemoglobin: 10.8 g/dL — ABNORMAL LOW (ref 13.0–17.0)
MCH: 31.7 pg (ref 26.0–34.0)
MCHC: 34.5 g/dL (ref 30.0–36.0)
MCV: 91.8 fL (ref 78.0–100.0)
PLATELETS: 241 10*3/uL (ref 150–400)
RBC: 3.41 MIL/uL — ABNORMAL LOW (ref 4.22–5.81)
RDW: 13.2 % (ref 11.5–15.5)
WBC: 8.9 10*3/uL (ref 4.0–10.5)

## 2015-03-30 LAB — GLUCOSE, CAPILLARY
GLUCOSE-CAPILLARY: 187 mg/dL — AB (ref 65–99)
GLUCOSE-CAPILLARY: 273 mg/dL — AB (ref 65–99)
Glucose-Capillary: 249 mg/dL — ABNORMAL HIGH (ref 65–99)
Glucose-Capillary: 231 mg/dL — ABNORMAL HIGH (ref 65–99)

## 2015-03-30 LAB — LACTIC ACID, PLASMA: LACTIC ACID, VENOUS: 1.5 mmol/L (ref 0.5–2.0)

## 2015-03-30 LAB — PROCALCITONIN: PROCALCITONIN: 4.09 ng/mL

## 2015-03-30 MED ORDER — POTASSIUM CHLORIDE CRYS ER 20 MEQ PO TBCR
40.0000 meq | EXTENDED_RELEASE_TABLET | Freq: Once | ORAL | Status: AC
  Administered 2015-03-30: 40 meq via ORAL
  Filled 2015-03-30: qty 2

## 2015-03-30 MED ORDER — POLYETHYLENE GLYCOL 3350 17 G PO PACK
17.0000 g | PACK | Freq: Every day | ORAL | Status: DC | PRN
  Administered 2015-03-31 – 2015-04-01 (×2): 17 g via ORAL
  Filled 2015-03-30 (×2): qty 1

## 2015-03-30 MED ORDER — MAGNESIUM SULFATE 2 GM/50ML IV SOLN
2.0000 g | Freq: Once | INTRAVENOUS | Status: AC
  Administered 2015-03-30: 2 g via INTRAVENOUS
  Filled 2015-03-30: qty 50

## 2015-03-30 MED ORDER — INSULIN ASPART 100 UNIT/ML ~~LOC~~ SOLN
0.0000 [IU] | Freq: Three times a day (TID) | SUBCUTANEOUS | Status: DC
  Administered 2015-03-30: 3 [IU] via SUBCUTANEOUS
  Administered 2015-03-30 (×2): 5 [IU] via SUBCUTANEOUS
  Administered 2015-03-31 – 2015-04-01 (×4): 8 [IU] via SUBCUTANEOUS
  Administered 2015-04-01 – 2015-04-02 (×3): 5 [IU] via SUBCUTANEOUS

## 2015-03-30 NOTE — Progress Notes (Signed)
ANTIBIOTIC CONSULT NOTE - INITIAL  Pharmacy Consult for ceftriaxone Indication: UTI/sepsis  No Known Allergies  Patient Measurements: Height: 6' (182.9 cm) Weight: 194 lb 7.1 oz (88.2 kg) IBW/kg (Calculated) : 77.6 Adjusted Body Weight:   Vital Signs: Temp: 99.2 F (37.3 C) (11/27 2258) Temp Source: Oral (11/27 2258) BP: 154/70 mmHg (11/27 2258) Pulse Rate: 86 (11/28 0000) Intake/Output from previous day: 11/27 0701 - 11/28 0700 In: 50 [IV Piggyback:50] Out: -  Intake/Output from this shift: Total I/O In: 50 [IV Piggyback:50] Out: -   Labs:  Recent Labs  03/29/15 1515  WBC 17.7*  HGB 13.8  PLT 331  CREATININE 1.24   Estimated Creatinine Clearance: 57.4 mL/min (by C-G formula based on Cr of 1.24). No results for input(s): VANCOTROUGH, VANCOPEAK, VANCORANDOM, GENTTROUGH, GENTPEAK, GENTRANDOM, TOBRATROUGH, TOBRAPEAK, TOBRARND, AMIKACINPEAK, AMIKACINTROU, AMIKACIN in the last 72 hours.   Microbiology: Recent Results (from the past 720 hour(s))  Urine culture     Status: None   Collection Time: 03/18/15  4:54 PM  Result Value Ref Range Status   Specimen Description URINE, CATHETERIZED  Final   Special Requests Normal  Final   Culture   Final    NO GROWTH 1 DAY Performed at [facility]    Report Status 03/19/2015 FINAL  Final    Medical History: Past Medical History  Diagnosis Date  . Hypertension   . Hyperlipidemia   . Diabetes mellitus   . History of shingles   . Colon cancer ([place]) 1992    colon resection 1992  . Sleep apnea     cpap    Medications:  Anti-infectives    Start     Dose/Rate Route Frequency Ordered Stop   03/30/15 1800  cefTRIAXone (ROCEPHIN) 2 g in dextrose 5 % 50 mL IVPB     2 g 100 mL/hr over 30 Minutes Intravenous Every 24 hours 03/29/15 2257     03/29/15 1845  cefTRIAXone (ROCEPHIN) 2 g in dextrose 5 % 50 mL IVPB     2 g 100 mL/hr over 30 Minutes Intravenous  Once 03/29/15 1844 03/29/15 2000     Assessment: Patient  with UTI/sepsis.  Failed ciprofloxacin as outpatient.  Goal of Therapy:  Rocephin based on manufacturer dosing recommendations.   Plan: Ceftriaxone 2gm iv q24hr Follow up culture results  Nani Skillern Crowford 03/30/2015,2:03 AM

## 2015-03-30 NOTE — Progress Notes (Signed)
Pt oxygen sats between 88-90%, looked into patient's room to see if CPAP machine was on. Pt removed CPAP machine, RN asked pt to put machine back on. Pt states "the machine makes too much noise and keeps me up, I don't want to wear it." Educated pt on the importance of the CPAP machine, pt still refused. Pt does not appear to have increased work of breathing. Will continue to monitor.

## 2015-03-30 NOTE — Progress Notes (Signed)
Nutrition Brief Note  Patient identified on the Malnutrition Screening Tool (MST) Report  Wt Readings from Last 15 Encounters:  03/29/15 194 lb 7.1 oz (88.2 kg)  03/19/15 216 lb 8 oz (98.204 kg)  12/26/14 217 lb (98.431 kg)  11/13/14 213 lb (96.616 kg)  10/30/14 213 lb (96.616 kg)  08/28/14 208 lb (94.348 kg)  05/20/14 199 lb (90.266 kg)  04/21/14 204 lb (92.534 kg)  04/11/14 199 lb (90.266 kg)  03/26/14 205 lb (92.987 kg)  01/27/14 199 lb (90.266 kg)  10/28/13 202 lb (91.627 kg)  08/19/13 214 lb (97.07 kg)  03/13/13 206 lb (93.441 kg)  01/21/13 212 lb (96.163 kg)    Body mass index is 26.37 kg/(m^2). Patient meets criteria for overweight based on current BMI.   Current diet order is carb modified, patient is consuming approximately 85% of meals at this time. Labs and medications reviewed.   No nutrition interventions warranted at this time. If nutrition issues arise, please consult RD.   Satira Anis. Cadden Elizondo, MS, RD LDN After Hours/Weekend Pager [phone]

## 2015-03-30 NOTE — Care Management Note (Signed)
Case Management Note  Patient Details  Name: Walter [name] MRN: [id number] Date of Birth: 12/16/1940  Subjective/Objective: 74 y/o m admitted w/Sepsis. From home.                   Action/Plan:d/c plan home.   Expected Discharge Date:                  Expected Discharge Plan:  Home/Self Care  In-House Referral:     Discharge planning Services  CM Consult  Post Acute Care Choice:    Choice offered to:     DME Arranged:    DME Agency:     HH Arranged:    HH Agency:     Status of Service:  In process, will continue to follow  Medicare Important Message Given:    Date Medicare IM Given:    Medicare IM give by:    Date Additional Medicare IM Given:    Additional Medicare Important Message give by:     If discussed at [place] of Stay Meetings, dates discussed:    Additional Comments:  Dessa Phi, RN 03/30/2015, 1:00 PM

## 2015-03-30 NOTE — Progress Notes (Signed)
Patient set up on CPAP for HS.  Nasal mask used, with room air, no humidity, and 7 cmH20, per patient home regimen.  Patient is familiar with equipment and procedure.

## 2015-03-30 NOTE — Progress Notes (Signed)
Patient has home CPAP in room.  Does not use water chamber.  RT verified machine functioning.  Patient will self administer tonight.  Patient verbalizes understanding that he can contact respiratory with any CPAP related questions or concerns.

## 2015-03-30 NOTE — Progress Notes (Signed)
[facility] PROGRESS NOTE  Walter [name] [id number] DOB: 04/07/1941 DOA: 03/29/2015 PCP: [name], MD  HPI/Brief narrative [phone] who presented s/p recent admit for hemorrhoid surgery, later found to have UTI and failed outpatient PO ciprofloxacin. Patient was admitted for UTI with sepsis, failing outpt abx.  Assessment/Plan: Complicated Urinary tract infection with sepsis present on admission - Patient previously failed outpatient antibiotics of ciprofloxacin.  - Presents with elevated heart rate of 108, temperature reported as high as 102.47F at home, WBC count 17.7, elevated lactate at 3.3, and a +UA(+LE/+Nitrites/Many Bac). Patient received at least 3 L of IV fluids while in the emergency department. - Now afebrile, clinically slowly improving - Follow pan cultures - pending  Diabetes mellitus type 2. Last hemoglobin A1c was 7.8 -Held home oral hypoglycemics Amaryl and metformin -Cont on SSI coverage  Essential hypertension -Continue Lopressor as tolerated  Acute Renal insufficiency - patient's baseline Cr 1.06 on 03/19/2015, but slightly elevated on admission today at 1.24. BUN noted to be 20. Previous history of urinary retention requiring Foley during last hospitalization. -Cr improving with IVF  Hyperlipidemia -Continue atorvastatin  OSA on CPAP: Patient reports home settings of 7 -Respiratory therapy to apply CPAP at night at settings of 7  Hypochloremia: Acute. Chloride level 97. Secondary to acute losses with fever -Cont IV fluids as tolerated  Insomnia -Zolpidem  Code Status: Full Family Communication: Pt in room Disposition Plan: Home when sepsis resolved and on PO meds   Consultants:    Procedures:    Antibiotics: Anti-infectives    Start     Dose/Rate Route Frequency Ordered Stop   03/30/15 1800  cefTRIAXone (ROCEPHIN) 2 g in dextrose 5 % 50 mL IVPB     2 g 100 mL/hr over 30 Minutes Intravenous Every 24 hours 03/29/15 2257     03/29/15  1845  cefTRIAXone (ROCEPHIN) 2 g in dextrose 5 % 50 mL IVPB     2 g 100 mL/hr over 30 Minutes Intravenous  Once 03/29/15 1844 03/29/15 2000      HPI/Subjective: Continues with generalized malaise  Objective: Filed Vitals:   03/30/15 0000 03/30/15 0523 03/30/15 1214 03/30/15 1400  BP:  149/70  131/75  Pulse: 86 82  83  Temp:  99.9 F (37.7 C) 98.8 F (37.1 C) 99.3 F (37.4 C)  TempSrc:  Oral  Oral  Resp: 16 16  18   Height:      Weight:      SpO2:  92%  95%    Intake/Output Summary (Last 24 hours) at 03/30/15 1737 Last data filed at 03/30/15 1500  Gross per 24 hour  Intake 2278.75 ml  Output   3351 ml  Net -1072.25 ml   Filed Weights   03/29/15 1917 03/29/15 2258  Weight: 88.451 kg (195 lb) 88.2 kg (194 lb 7.1 oz)    Exam:   General:  Awake, in nad  Cardiovascular: regular, s1, s2  Respiratory: normal resp effort, no wheezing  Abdomen: soft,nondistended  Musculoskeletal: perfused, no clubbing   Data Reviewed: Basic Metabolic Panel:  Recent Labs Lab 03/29/15 1515 03/29/15 2308 03/30/15 0147  NA 135  --  130*  K 4.0  --  3.4*  CL 97*  --  97*  CO2 27  --  24  GLUCOSE 208*  --  284*  BUN 20  --  17  CREATININE 1.24  --  0.93  CALCIUM 9.4  --  7.8*  MG  --  1.4*  --  Liver Function Tests:  Recent Labs Lab 03/29/15 1515  AST 30  ALT 58  ALKPHOS 81  BILITOT 0.7  PROT 7.3  ALBUMIN 3.5   No results for input(s): LIPASE, AMYLASE in the last 168 hours. No results for input(s): AMMONIA in the last 168 hours. CBC:  Recent Labs Lab 03/29/15 1515 03/30/15 0147  WBC 17.7* 8.9  NEUTROABS 15.5*  --   HGB 13.8 10.8*  HCT 40.8 31.3*  MCV 92.5 91.8  PLT 331 241   Cardiac Enzymes: No results for input(s): CKTOTAL, CKMB, CKMBINDEX, TROPONINI in the last 168 hours. BNP (last 3 results) No results for input(s): BNP in the last 8760 hours.  ProBNP (last 3 results) No results for input(s): PROBNP in the last 8760 hours.  CBG:  Recent  Labs Lab 03/30/15 0815 03/30/15 1212 03/30/15 1704  GLUCAP 249* 231* 187*    No results found for this or any previous visit (from the past 240 hour(s)).   Studies: Dg Chest 2 View  03/29/2015  CLINICAL DATA:  Sepsis and fever. Hemorrhoid surgery 03/13/2015. Remote history of colon cancer. EXAM: CHEST - 2 VIEW COMPARISON:  None. FINDINGS: The heart size is normal. Atherosclerotic changes are noted at the aortic arch. Minimal left basilar airspace disease likely reflects atelectasis with some elevation of the left hemidiaphragm. The right lung is clear. The visualized soft tissues and bony thorax are unremarkable. IMPRESSION: 1. Minimal left basilar airspace disease likely reflects atelectasis. Early infection is considered less likely. 2. Atherosclerotic calcifications at the aortic arch. Electronically Signed   By: San Morelle M.D.   On: 03/29/2015 19:58   Dg Abd Portable 1v  03/30/2015  CLINICAL DATA:  Generalized abdominal pain and constipation. EXAM: PORTABLE ABDOMEN - 1 VIEW COMPARISON:  None. FINDINGS: There is gas throughout nondilated loops of large and small bowel. Bowel sutures are identified within the lower central pelvis. No evidence for free intraperitoneal air on this supine view. IMPRESSION: Findings most consistent with ileus. Electronically Signed   By: Nolon Nations M.D.   On: 03/30/2015 12:09    Scheduled Meds: . amLODipine  5 mg Oral Daily  . aspirin EC  81 mg Oral Daily  . atorvastatin  20 mg Oral q1800  . cefTRIAXone (ROCEPHIN)  IV  2 g Intravenous Q24H  . enoxaparin (LOVENOX) injection  40 mg Subcutaneous QHS  . insulin aspart  0-15 Units Subcutaneous TID WC  . irbesartan  150 mg Oral Daily  . multivitamin with minerals  1 tablet Oral Daily  . tamsulosin  0.4 mg Oral BID   Continuous Infusions: . sodium chloride 125 mL/hr at 03/30/15 [id number]    Principal Problem:   Sepsis secondary to UTI [facility]) Active Problems:   Diabetes mellitus type II,  uncontrolled ([place])   Hyperlipidemia   Essential hypertension   OSA on CPAP   Complicated UTI (urinary tract infection)   Pyrexia   UTI (lower urinary tract infection)   [facility], [place] [facility] Pager [phone]. If 7PM-7AM, please contact night-coverage at [email or web address], password [facility] 03/30/2015, 5:37 PM  LOS: 1 day

## 2015-03-31 LAB — BASIC METABOLIC PANEL
ANION GAP: 5 (ref 5–15)
BUN: 13 mg/dL (ref 6–20)
CALCIUM: 8.1 mg/dL — AB (ref 8.9–10.3)
CO2: 31 mmol/L (ref 22–32)
Chloride: 97 mmol/L — ABNORMAL LOW (ref 101–111)
Creatinine, Ser: 0.88 mg/dL (ref 0.61–1.24)
GFR calc Af Amer: >60 mL/min (ref >60)
GFR calc non Af Amer: >60 mL/min (ref >60)
GLUCOSE: 244 mg/dL — AB (ref 65–99)
Potassium: 3.8 mmol/L (ref 3.5–5.1)
Sodium: 133 mmol/L — ABNORMAL LOW (ref 135–145)

## 2015-03-31 LAB — CBC
HCT: 32.8 % — ABNORMAL LOW (ref 39.0–52.0)
Hemoglobin: 11.1 g/dL — ABNORMAL LOW (ref 13.0–17.0)
MCH: 30.9 pg (ref 26.0–34.0)
MCHC: 33.8 g/dL (ref 30.0–36.0)
MCV: 91.4 fL (ref 78.0–100.0)
PLATELETS: 237 10*3/uL (ref 150–400)
RBC: 3.59 MIL/uL — AB (ref 4.22–5.81)
RDW: 12.9 % (ref 11.5–15.5)
WBC: 6.9 10*3/uL (ref 4.0–10.5)

## 2015-03-31 LAB — GLUCOSE, CAPILLARY
Glucose-Capillary: 284 mg/dL — ABNORMAL HIGH (ref 65–99)
Glucose-Capillary: 296 mg/dL — ABNORMAL HIGH (ref 65–99)
Glucose-Capillary: 273 mg/dL — ABNORMAL HIGH (ref 65–99)
Glucose-Capillary: 219 mg/dL — ABNORMAL HIGH (ref 65–99)

## 2015-03-31 LAB — MAGNESIUM: Magnesium: 1.7 mg/dL (ref 1.7–2.4)

## 2015-03-31 MED ORDER — DEXTROSE 5 % IV SOLN
2.0000 g | INTRAVENOUS | Status: DC
  Administered 2015-03-31 – 2015-04-01 (×2): 2 g via INTRAVENOUS
  Filled 2015-03-31 (×2): qty 2

## 2015-03-31 MED ORDER — CEFUROXIME AXETIL 250 MG PO TABS
250.0000 mg | ORAL_TABLET | Freq: Two times a day (BID) | ORAL | Status: DC
Start: 2015-03-31 — End: 2015-03-31
  Filled 2015-03-31: qty 1

## 2015-03-31 NOTE — Progress Notes (Signed)
Inpatient Diabetes Program Recommendations  AACE/ADA: New Consensus Statement on Inpatient Glycemic Control (2015)  Target Ranges:  Prepandial:   less than 140 mg/dL      Peak postprandial:   less than 180 mg/dL (1-2 hours)      Critically ill patients:  140 - 180 mg/dL   Review of Glycemic Control  Diabetes history: DM2 Outpatient Diabetes medications: metformin 1000 mg bid, Amaryl 2 mg QAM Current orders for Inpatient glycemic control: Novolog moderate tidwc   Results for Walter [name], Walter [name] (MRN [id number]) as of 03/31/2015 09:44  Ref. Range 03/30/2015 08:15 03/30/2015 12:12 03/30/2015 17:04 03/30/2015 20:50 03/31/2015 07:09  Glucose-Capillary Latest Ref Range: 65-99 mg/dL 249 (H) 231 (H) 187 (H) 273 (H) 284 (H)   Blood sugars > 200 mg/dL.   Inpatient Diabetes Program Recommendations:  Insulin - Basal: Consider addition of Lantus 10 units QHS Correction (SSI): Increase Novolog to resistant tidwc and hs HgbA1C: 8.5% - may not be accurate with low H/H   Will continue to follow. Thank you. Lorenda Peck, RD, LDN, CDE Inpatient Diabetes Coordinator [phone]

## 2015-03-31 NOTE — Progress Notes (Signed)
MD updated via telephone pt with temperature orally of 102. Pt states he feels a little weak an. BP 122/76 pulse 88. Resp 18. New orders placed.

## 2015-03-31 NOTE — Progress Notes (Signed)
[facility] PROGRESS NOTE  Valicia Rief Ristine [id number] DOB: [birth year] DOA: 03/29/2015 PCP: [name], MD  HPI/Brief narrative [phone] who presented s/p recent admit for hemorrhoid surgery, later found to have UTI and failed outpatient PO ciprofloxacin. Patient was admitted for UTI with sepsis, failing outpt abx.  Assessment/Plan: Complicated GNR Urinary tract infection with sepsis present on admission - Patient previously failed outpatient antibiotics of ciprofloxacin.  - Presents with elevated heart rate of 108, temperature reported as high as 102.64F at home, WBC count 17.7, elevated lactate at 3.3, and a +UA(+LE/+Nitrites/Many Bac). Patient received at least 3 L of IV fluids while in the emergency department. - Clinically improving, however patient noted to have fever of 101.3 this afternoon. As pt is febrile, will continue IV abx until afebrile x 24hrs - Blood cx neg x 3 - Urine cx with >100,000 GNR  Diabetes mellitus type 2. Last hemoglobin A1c was 7.8 -Held home oral hypoglycemics Amaryl and metformin while inpatient -Cont on SSI coverage  Essential hypertension -Continue Lopressor as tolerated  Acute Renal insufficiency - patient's baseline Cr 1.06 on 03/19/2015, but slightly elevated on admission today at 1.24. BUN noted to be 20. Previous history of urinary retention requiring Foley during last hospitalization. -Cr improving with IVF  Hyperlipidemia -Continue atorvastatin  OSA on CPAP: Patient reports home settings of 7 -Respiratory therapy to apply CPAP at night at settings of 7  Hypochloremia: Secondary to acute losses with fever -Cont IV fluids as tolerated  Insomnia -Zolpidem  Code Status: Full Family Communication: Pt in room Disposition Plan: Home when sepsis resolved and on PO meds   Consultants:    Procedures:    Antibiotics: Anti-infectives    Start     Dose/Rate Route Frequency Ordered Stop   03/31/15 1800  cefTRIAXone (ROCEPHIN) 2 g in  dextrose 5 % 50 mL IVPB     2 g 100 mL/hr over 30 Minutes Intravenous Every 24 hours 03/31/15 1241     03/31/15 1700  cefUROXime (CEFTIN) tablet 250 mg  Status:  Discontinued     250 mg Oral 2 times daily with meals 03/31/15 0947 03/31/15 1240   03/30/15 1800  cefTRIAXone (ROCEPHIN) 2 g in dextrose 5 % 50 mL IVPB  Status:  Discontinued     2 g 100 mL/hr over 30 Minutes Intravenous Every 24 hours 03/29/15 2257 03/31/15 0947   03/29/15 1845  cefTRIAXone (ROCEPHIN) 2 g in dextrose 5 % 50 mL IVPB     2 g 100 mL/hr over 30 Minutes Intravenous  Once 03/29/15 1844 03/29/15 2000      HPI/Subjective: Reports feeling much better today  Objective: Filed Vitals:   03/31/15 1232 03/31/15 1258 03/31/15 1332 03/31/15 1347  BP:  122/76 129/65   Pulse:  88 90   Temp: 102 F (38.9 C)  101.1 F (38.4 C) 100.3 F (37.9 C)  TempSrc:   Oral Oral  Resp:   18   Height:      Weight:      SpO2:  98% 95%     Intake/Output Summary (Last 24 hours) at 03/31/15 1523 Last data filed at 03/31/15 1000  Gross per 24 hour  Intake   2655 ml  Output   2000 ml  Net    655 ml   Filed Weights   03/29/15 1917 03/29/15 2258  Weight: 88.451 kg (195 lb) 88.2 kg (194 lb 7.1 oz)    Exam:   General:  Awake, laying in bed, in nad  Cardiovascular:  regular, s1, s2  Respiratory: normal resp effort, no wheezing  Abdomen: soft,nondistended  Musculoskeletal: perfused, no clubbing, no cyanosis  Data Reviewed: Basic Metabolic Panel:  Recent Labs Lab 03/29/15 1515 03/29/15 2308 03/30/15 0147 03/31/15 0454  NA 135  --  130* 133*  K 4.0  --  3.4* 3.8  CL 97*  --  97* 97*  CO2 27  --  24 31  GLUCOSE 208*  --  284* 244*  BUN 20  --  17 13  CREATININE 1.24  --  0.93 0.88  CALCIUM 9.4  --  7.8* 8.1*  MG  --  1.4*  --  1.7   Liver Function Tests:  Recent Labs Lab 03/29/15 1515  AST 30  ALT 58  ALKPHOS 81  BILITOT 0.7  PROT 7.3  ALBUMIN 3.5   No results for input(s): LIPASE, AMYLASE in the  last 168 hours. No results for input(s): AMMONIA in the last 168 hours. CBC:  Recent Labs Lab 03/29/15 1515 03/30/15 0147 03/31/15 0454  WBC 17.7* 8.9 6.9  NEUTROABS 15.5*  --   --   HGB 13.8 10.8* 11.1*  HCT 40.8 31.3* 32.8*  MCV 92.5 91.8 91.4  PLT 331 241 237   Cardiac Enzymes: No results for input(s): CKTOTAL, CKMB, CKMBINDEX, TROPONINI in the last 168 hours. BNP (last 3 results) No results for input(s): BNP in the last 8760 hours.  ProBNP (last 3 results) No results for input(s): PROBNP in the last 8760 hours.  CBG:  Recent Labs Lab 03/30/15 1212 03/30/15 1704 03/30/15 2050 03/31/15 0709 03/31/15 1149  GLUCAP 231* 187* 273* 284* 296*    Recent Results (from the past 240 hour(s))  Culture, blood (routine x 2)     Status: None (Preliminary result)   Collection Time: 03/29/15  6:16 PM  Result Value Ref Range Status   Specimen Description BLOOD RIGHT ANTECUBITAL  Final   Special Requests BOTTLES DRAWN AEROBIC AND ANAEROBIC 5CC  Final   Culture   Final    NO GROWTH 1 DAY Performed at [facility]    Report Status PENDING  Incomplete  Culture, blood (routine x 2)     Status: None (Preliminary result)   Collection Time: 03/29/15  6:20 PM  Result Value Ref Range Status   Specimen Description BLOOD LEFT ANTECUBITAL  Final   Special Requests BOTTLES DRAWN AEROBIC AND ANAEROBIC 5CC  Final   Culture   Final    NO GROWTH 1 DAY Performed at [facility]    Report Status PENDING  Incomplete  Urine culture     Status: None (Preliminary result)   Collection Time: 03/29/15  6:30 PM  Result Value Ref Range Status   Specimen Description URINE, CLEAN CATCH BAG  Final   Special Requests NONE  Final   Culture   Final    >=100,000 COLONIES/mL GRAM NEGATIVE RODS Performed at [facility]    Report Status PENDING  Incomplete     Studies: Dg Chest 2 View  03/29/2015  CLINICAL DATA:  Sepsis and fever. Hemorrhoid surgery 03/13/2015. Remote history  of colon cancer. EXAM: CHEST - 2 VIEW COMPARISON:  None. FINDINGS: The heart size is normal. Atherosclerotic changes are noted at the aortic arch. Minimal left basilar airspace disease likely reflects atelectasis with some elevation of the left hemidiaphragm. The right lung is clear. The visualized soft tissues and bony thorax are unremarkable. IMPRESSION: 1. Minimal left basilar airspace disease likely reflects atelectasis. Early infection is considered  less likely. 2. Atherosclerotic calcifications at the aortic arch. Electronically Signed   By: San Morelle M.D.   On: 03/29/2015 19:58   Dg Abd Portable 1v  03/30/2015  CLINICAL DATA:  Generalized abdominal pain and constipation. EXAM: PORTABLE ABDOMEN - 1 VIEW COMPARISON:  None. FINDINGS: There is gas throughout nondilated loops of large and small bowel. Bowel sutures are identified within the lower central pelvis. No evidence for free intraperitoneal air on this supine view. IMPRESSION: Findings most consistent with ileus. Electronically Signed   By: Nolon Nations M.D.   On: 03/30/2015 12:09    Scheduled Meds: . amLODipine  5 mg Oral Daily  . aspirin EC  81 mg Oral Daily  . atorvastatin  20 mg Oral q1800  . cefTRIAXone (ROCEPHIN)  IV  2 g Intravenous Q24H  . enoxaparin (LOVENOX) injection  40 mg Subcutaneous QHS  . insulin aspart  0-15 Units Subcutaneous TID WC  . irbesartan  150 mg Oral Daily  . multivitamin with minerals  1 tablet Oral Daily  . tamsulosin  0.4 mg Oral BID   Continuous Infusions: . sodium chloride 125 mL/hr at 03/31/15 0740    Principal Problem:   Sepsis secondary to UTI [facility]) Active Problems:   Diabetes mellitus type II, uncontrolled ([place])   Hyperlipidemia   Essential hypertension   OSA on CPAP   Complicated UTI (urinary tract infection)   Pyrexia   UTI (lower urinary tract infection)   [place] Davidian, [place] [facility] Pager [phone]. If 7PM-7AM, please contact night-coverage at [email or web address],  password [facility] 03/31/2015, 3:23 PM  LOS: 2 days

## 2015-03-31 NOTE — Progress Notes (Signed)
Pt reports that he had a small rock hard very dark BM. Pt did not save for RN to check. Instructed Pt to save and notify staff when he has another BM. Pt voiced understanding

## 2015-03-31 NOTE — Progress Notes (Signed)
Report received from previous RN Magdalen Spatz RN. Agree with previous assessment and no acute changes noted with Pt's assessment at this time. Maintain current plan of care for Pt.

## 2015-04-01 DIAGNOSIS — B9620 Unspecified Escherichia coli [E. coli] as the cause of diseases classified elsewhere: Secondary | ICD-10-CM

## 2015-04-01 DIAGNOSIS — A419 Sepsis, unspecified organism: Principal | ICD-10-CM

## 2015-04-01 DIAGNOSIS — N390 Urinary tract infection, site not specified: Secondary | ICD-10-CM

## 2015-04-01 DIAGNOSIS — E785 Hyperlipidemia, unspecified: Secondary | ICD-10-CM

## 2015-04-01 DIAGNOSIS — E119 Type 2 diabetes mellitus without complications: Secondary | ICD-10-CM | POA: Insufficient documentation

## 2015-04-01 DIAGNOSIS — R339 Retention of urine, unspecified: Secondary | ICD-10-CM | POA: Diagnosis present

## 2015-04-01 DIAGNOSIS — G4733 Obstructive sleep apnea (adult) (pediatric): Secondary | ICD-10-CM

## 2015-04-01 DIAGNOSIS — I10 Essential (primary) hypertension: Secondary | ICD-10-CM

## 2015-04-01 LAB — BASIC METABOLIC PANEL
Anion gap: 4 — ABNORMAL LOW (ref 5–15)
BUN: 11 mg/dL (ref 6–20)
CALCIUM: 8.1 mg/dL — AB (ref 8.9–10.3)
CHLORIDE: 101 mmol/L (ref 101–111)
CO2: 30 mmol/L (ref 22–32)
CREATININE: 0.83 mg/dL (ref 0.61–1.24)
GFR calc non Af Amer: >60 mL/min (ref >60)
Glucose, Bld: 237 mg/dL — ABNORMAL HIGH (ref 65–99)
Potassium: 4 mmol/L (ref 3.5–5.1)
SODIUM: 135 mmol/L (ref 135–145)

## 2015-04-01 LAB — CBC
HCT: 31.7 % — ABNORMAL LOW (ref 39.0–52.0)
Hemoglobin: 10.8 g/dL — ABNORMAL LOW (ref 13.0–17.0)
MCH: 30.6 pg (ref 26.0–34.0)
MCHC: 34.1 g/dL (ref 30.0–36.0)
MCV: 89.8 fL (ref 78.0–100.0)
PLATELETS: 224 10*3/uL (ref 150–400)
RBC: 3.53 MIL/uL — AB (ref 4.22–5.81)
RDW: 13 % (ref 11.5–15.5)
WBC: 6.8 10*3/uL (ref 4.0–10.5)

## 2015-04-01 LAB — GLUCOSE, CAPILLARY
GLUCOSE-CAPILLARY: 233 mg/dL — AB (ref 65–99)
Glucose-Capillary: 203 mg/dL — ABNORMAL HIGH (ref 65–99)
Glucose-Capillary: 258 mg/dL — ABNORMAL HIGH (ref 65–99)
Glucose-Capillary: 225 mg/dL — ABNORMAL HIGH (ref 65–99)

## 2015-04-01 LAB — URINE CULTURE: Culture: >=100000

## 2015-04-01 LAB — MAGNESIUM: MAGNESIUM: 1.6 mg/dL — AB (ref 1.7–2.4)

## 2015-04-01 MED ORDER — CEFUROXIME AXETIL 500 MG PO TABS
500.0000 mg | ORAL_TABLET | Freq: Two times a day (BID) | ORAL | Status: DC
  Administered 2015-04-02: 500 mg via ORAL
  Filled 2015-04-01 (×2): qty 1

## 2015-04-01 MED ORDER — MAGNESIUM SULFATE 4 GM/100ML IV SOLN
4.0000 g | Freq: Once | INTRAVENOUS | Status: AC
  Administered 2015-04-01: 4 g via INTRAVENOUS
  Filled 2015-04-01: qty 100

## 2015-04-01 MED ORDER — INSULIN GLARGINE 100 UNIT/ML ~~LOC~~ SOLN
5.0000 [IU] | Freq: Every day | SUBCUTANEOUS | Status: DC
  Administered 2015-04-01: 5 [IU] via SUBCUTANEOUS
  Filled 2015-04-01: qty 0.05

## 2015-04-01 NOTE — Care Management Important Message (Signed)
Important Message  Patient Details  Name: Naguan Mazon MRN: [id number] Date of Birth: April 08, 1941   Medicare Important Message Given:  Yes    Camillo Flaming 04/01/2015, 1:02 [place] Message  Patient Details  Name: Tennyson Simone MRN: [id number] Date of Birth: 1940/06/17   Medicare Important Message Given:  Yes    Camillo Flaming 04/01/2015, 1:02 PM

## 2015-04-01 NOTE — Progress Notes (Signed)
[facility] PROGRESS NOTE  Walter [name] [id number] DOB: 12/24/1940 DOA: 03/29/2015 PCP: [name], MD  Assessment/Plan: #1 sepsis secondary to urinary tract infection Patient failed outpatient antibiotics of ciprofloxacin. Patient on admission had a heart rate of 108 temperature 102.8 white count of 17.7 with elevated lactic acid level of 3.3 and a urinalysis consistent with a UTI. Patient was given IV fluids in the ED. Patient improving clinically. Fever curve trending down. Blood cultures pending with no growth to date. Urine cultures with greater than 100,000 Escherichia coli sensitive to cephalosporins and resistant to fluoroquinolones. Leukocytosis has resolved. Continue IV Rocephin. We'll transition to oral antibiotics tomorrow.  #2 Escherichia coli UTI Sensitive to cephalosporins and resistant to fluoroquinolones. Continue IV Rocephin. Transition to oral Ceftin tomorrow to finish course of antibiotics.  #3 hypokalemia/hypomagnesemia Replete.  #4 diabetes mellitus type 2 Hemoglobin A1c was 7.8. Oral hypoglycemic agents on hold. CBGs have ranged from 203 06/03/1956. Place on Lantus 5 units daily. Continue sliding scale insulin. Outpatient follow-up.  #5 hypertension Stable. Continue Norvasc and Avapro.  #6 hyperlipidemia Continue statin.  #7 OSA on CPAP C Pap daily at bedtime.  #8 insomnia Ambien.  #9 black stools Likely old blood post hemorrhoidal surgery. H&H stable. Follow.  #10 urinary retention Continue Foley catheter. Flomax. Outpatient follow-up with urology for voiding trial.  #11 prophylaxis Lovenox for DVT prophylaxis.   Code Status: Full Family Communication: Updated patient and wife at bedside. Disposition Plan: Home hopefully tomorrow.   Consultants:  None  Procedures:  Abdominal x-ray 03/30/2015  Chest x-ray 03/29/2015  Antibiotics:  IV Rocephin 03/29/2015  HPI/Subjective: Patient states he is feeling much better. No chest  pain. No shortness of breath. Patient states he's having black stools.  Objective: Filed Vitals:   03/31/15 2213 04/01/15 0543  BP: 148/76 155/73  Pulse: 72 68  Temp: 98.9 F (37.2 C) 98.4 F (36.9 C)  Resp: 20 20    Intake/Output Summary (Last 24 hours) at 04/01/15 1905 Last data filed at 04/01/15 1726  Gross per 24 hour  Intake   3530 ml  Output   1800 ml  Net   1730 ml   Filed Weights   03/29/15 1917 03/29/15 2258  Weight: 88.451 kg (195 lb) 88.2 kg (194 lb 7.1 oz)    Exam:   General:  NAD  Cardiovascular: RRR  Respiratory: CTAB  Abdomen: Soft/NT/ND/+BS  Musculoskeletal: No c/c/e  Data Reviewed: Basic Metabolic Panel:  Recent Labs Lab 03/29/15 1515 03/29/15 2308 03/30/15 0147 03/31/15 0454 04/01/15 0525  NA 135  --  130* 133* 135  K 4.0  --  3.4* 3.8 4.0  CL 97*  --  97* 97* 101  CO2 27  --  24 31 30   GLUCOSE 208*  --  284* 244* 237*  BUN 20  --  17 13 11   CREATININE 1.24  --  0.93 0.88 0.83  CALCIUM 9.4  --  7.8* 8.1* 8.1*  MG  --  1.4*  --  1.7 1.6*   Liver Function Tests:  Recent Labs Lab 03/29/15 1515  AST 30  ALT 58  ALKPHOS 81  BILITOT 0.7  PROT 7.3  ALBUMIN 3.5   No results for input(s): LIPASE, AMYLASE in the last 168 hours. No results for input(s): AMMONIA in the last 168 hours. CBC:  Recent Labs Lab 03/29/15 1515 03/30/15 0147 03/31/15 0454 04/01/15 0525  WBC 17.7* 8.9 6.9 6.8  NEUTROABS 15.5*  --   --   --   HGB  13.8 10.8* 11.1* 10.8*  HCT 40.8 31.3* 32.8* 31.7*  MCV 92.5 91.8 91.4 89.8  PLT 331 241 237 224   Cardiac Enzymes: No results for input(s): CKTOTAL, CKMB, CKMBINDEX, TROPONINI in the last 168 hours. BNP (last 3 results) No results for input(s): BNP in the last 8760 hours.  ProBNP (last 3 results) No results for input(s): PROBNP in the last 8760 hours.  CBG:  Recent Labs Lab 03/31/15 1639 03/31/15 2211 04/01/15 0739 04/01/15 1133 04/01/15 1633  GLUCAP 273* 219* 203* 258* 233*    Recent  Results (from the past 240 hour(s))  Culture, blood (routine x 2)     Status: None (Preliminary result)   Collection Time: 03/29/15  6:16 PM  Result Value Ref Range Status   Specimen Description BLOOD RIGHT ANTECUBITAL  Final   Special Requests BOTTLES DRAWN AEROBIC AND ANAEROBIC 5CC  Final   Culture   Final    NO GROWTH 2 DAYS Performed at [facility]    Report Status PENDING  Incomplete  Culture, blood (routine x 2)     Status: None (Preliminary result)   Collection Time: 03/29/15  6:20 PM  Result Value Ref Range Status   Specimen Description BLOOD LEFT ANTECUBITAL  Final   Special Requests BOTTLES DRAWN AEROBIC AND ANAEROBIC 5CC  Final   Culture   Final    NO GROWTH 2 DAYS Performed at [facility]    Report Status PENDING  Incomplete  Urine culture     Status: None   Collection Time: 03/29/15  6:30 PM  Result Value Ref Range Status   Specimen Description URINE, CLEAN CATCH BAG  Final   Special Requests NONE  Final   Culture   Final    >=100,000 COLONIES/mL ESCHERICHIA COLI Performed at [facility]    Report Status 04/01/2015 FINAL  Final   Organism ID, Bacteria ESCHERICHIA COLI  Final      Susceptibility   Escherichia coli - MIC*    AMPICILLIN >=32 RESISTANT Resistant     CEFAZOLIN <=4 SENSITIVE Sensitive     CEFTRIAXONE <=1 SENSITIVE Sensitive     CIPROFLOXACIN >=4 RESISTANT Resistant     GENTAMICIN <=1 SENSITIVE Sensitive     IMIPENEM <=0.25 SENSITIVE Sensitive     NITROFURANTOIN <=16 SENSITIVE Sensitive     TRIMETH/SULFA <=20 SENSITIVE Sensitive     AMPICILLIN/SULBACTAM 16 INTERMEDIATE Intermediate     PIP/TAZO 8 SENSITIVE Sensitive     * >=100,000 COLONIES/mL ESCHERICHIA COLI     Studies: No results found.  Scheduled Meds: . amLODipine  5 mg Oral Daily  . aspirin EC  81 mg Oral Daily  . atorvastatin  20 mg Oral q1800  . cefTRIAXone (ROCEPHIN)  IV  2 g Intravenous Q24H  . enoxaparin (LOVENOX) injection  40 mg Subcutaneous QHS  .  insulin aspart  0-15 Units Subcutaneous TID WC  . insulin glargine  5 Units Subcutaneous QHS  . irbesartan  150 mg Oral Daily  . magnesium sulfate 1 - 4 g bolus IVPB  4 g Intravenous Once  . multivitamin with minerals  1 tablet Oral Daily  . tamsulosin  0.4 mg Oral BID   Continuous Infusions: . sodium chloride 125 mL/hr at 04/01/15 0549    Principal Problem:   Sepsis secondary to UTI [facility]) Active Problems:   E. coli UTI   Diabetes mellitus type II, uncontrolled ([place])   Hyperlipidemia   Essential hypertension   OSA on CPAP   Complicated UTI (  urinary tract infection)   Pyrexia   Urinary retention   Type 2 diabetes, HbA1c goal < 7% ([place])    Time spent: 35 mins    [facility] MD [facility] Pager [phone]. If 7PM-7AM, please contact night-coverage at [email or web address], password [facility] 04/01/2015, 7:05 PM  LOS: 3 days

## 2015-04-02 LAB — BASIC METABOLIC PANEL
Anion gap: 9 (ref 5–15)
BUN: 11 mg/dL (ref 6–20)
CO2: 26 mmol/L (ref 22–32)
CREATININE: 0.77 mg/dL (ref 0.61–1.24)
Calcium: 8.3 mg/dL — ABNORMAL LOW (ref 8.9–10.3)
Chloride: 102 mmol/L (ref 101–111)
GFR calc Af Amer: >60 mL/min (ref >60)
GLUCOSE: 194 mg/dL — AB (ref 65–99)
POTASSIUM: 4.1 mmol/L (ref 3.5–5.1)
Sodium: 137 mmol/L (ref 135–145)

## 2015-04-02 LAB — CBC
HCT: 33 % — ABNORMAL LOW (ref 39.0–52.0)
HEMOGLOBIN: 11.2 g/dL — AB (ref 13.0–17.0)
MCH: 31 pg (ref 26.0–34.0)
MCHC: 33.9 g/dL (ref 30.0–36.0)
MCV: 91.4 fL (ref 78.0–100.0)
PLATELETS: 249 10*3/uL (ref 150–400)
RBC: 3.61 MIL/uL — AB (ref 4.22–5.81)
RDW: 13.1 % (ref 11.5–15.5)
WBC: 8.1 10*3/uL (ref 4.0–10.5)

## 2015-04-02 LAB — MAGNESIUM: MAGNESIUM: 2 mg/dL (ref 1.7–2.4)

## 2015-04-02 LAB — GLUCOSE, CAPILLARY: Glucose-Capillary: 211 mg/dL — ABNORMAL HIGH (ref 65–99)

## 2015-04-02 MED ORDER — CEFUROXIME AXETIL 500 MG PO TABS: 500.0000 mg | ORAL_TABLET | Freq: Two times a day (BID) | ORAL | Status: AC

## 2015-04-02 MED ORDER — POLYETHYLENE GLYCOL 3350 17 G PO PACK: 17.0000 g | PACK | Freq: Every day | ORAL | Status: DC | PRN

## 2015-04-02 NOTE — Care Management Note (Signed)
Case Management Note  Patient Details  Name: Walter [name] MRN: [id number] Date of Birth: 07-07-1940  Subjective/Objective:                    Action/Plan:d/c home no needs or orders.   Expected Discharge Date:                  Expected Discharge Plan:  Home/Self Care  In-House Referral:     Discharge planning Services  CM Consult  Post Acute Care Choice:    Choice offered to:     DME Arranged:    DME Agency:     HH Arranged:    [place] Agency:     Status of Service:  Completed, signed off  Medicare Important Message Given:  Yes Date Medicare IM Given:    Medicare IM give by:    Date Additional Medicare IM Given:    Additional Medicare Important Message give by:     If discussed at [place] of Stay Meetings, dates discussed:    Additional Comments:  Dessa Phi, RN 04/02/2015, 2:09 PM

## 2015-04-02 NOTE — Discharge Summary (Signed)
Physician Discharge Summary  Walter [name] [id number] DOB: 1940-10-12 DOA: 03/29/2015  PCP: [name], MD  Admit date: 03/29/2015 Discharge date: 04/02/2015  Time spent: 65 minutes  Recommendations for Outpatient Follow-up:  1. Follow up with [name], MD in 2 weeks. On follow up patient's DM will need to be reassesed. On follow up patient will need a CBC and BMET done to follow up on hemoglobin, electrolytes and renal function. 2. Follow up with General Surgery as scheduled. 3. Follow up with Urology as scheduled.   Discharge Diagnoses:  Principal Problem:   Sepsis secondary to UTI [facility]) Active Problems:   E. coli UTI   Diabetes mellitus type II, uncontrolled ([place])   Hyperlipidemia   Essential hypertension   OSA on CPAP   Complicated UTI (urinary tract infection)   Pyrexia   Urinary retention   Type 2 diabetes, HbA1c goal < 7% ([place])   Discharge Condition: Stable and improved.  Diet recommendation: Carb modified diet.  Filed Weights   03/29/15 1917 03/29/15 2258  Weight: 88.451 kg (195 lb) 88.2 kg (194 lb 7.1 oz)    History of present illness:  Per Dr Tamala Julian Patient is a 74 year old male with a past medical history significant for HTN, DM type II, HLD, colon cancer s/p resection in 1992, OSA on CPAP; who presented with complaints of fever, chills, and confusion. Patient stated symptoms started the November 11th after he had a surgery for a hemorrhoidectomy. At that time, he was told by the surgeon to make sure that he was taking a stool softener and drinking plenty of water to keep his bowels regular. However, patient was unable to have a bowel movement despite following recommendations. He called back to the surgeon's office and was advised to double his stool softener. Approximately 5 days after this surgery patient had worsening difficulty with urination, still no bowel movement, and decreased urine output. He returned to the hospital and was found to have urinary  retention. A Foley catheter was placed which drained per patient "1750 mL" of fluid. He was admitted overnight and DC the following morning with the foley. He was to keep the foley in place until he followed up with the surgeon on November 21. On the 21st, the foley catheter was removed, however patient was still unable to urinate. The surgeon referred him to a urologist who then replaced the foley catheter. They recommended that the patient keep the foley catheter in until December 12. However, 2 days later after the foley was replaced he spiked a fever of 102.48F. Tried to treat symptoms with over-the-counter medications with mild relief. They called the urologist to notify him and he prescribed ciprofloxacin over the phone for presumed UTI. Also advised him to take the Flomax 2 times daily. Thereafter patient reported initially feeling better. Until 2 days prior to admission, when he again started to spikes fever, chills, diaphoresis, and was very confused. Wife noted that he was "talking out of his head". Reportedly he even stumbled and fell on to his knees a one point. He denied any nausea, vomiting, abdominal pain, or shortness of breath. He reports that the hemorrhoidectomy site is healing well and has some mild drainage which was told to be expected. No significant redness or swelling noted around surgical site.  Upon arrival to the emergency department patient was found to have a heart rate of 108, WBC count of 17.7, lactic acid 3.3, and a positive UA.    Hospital Course:  #1 sepsis secondary to  urinary tract infection Patient failed outpatient antibiotics of ciprofloxacin. Patient on admission had a heart rate of 108 temperature 102.8 white count of 17.7 with elevated lactic acid level of 3.3 and a urinalysis consistent with a UTI. Patient was given IV fluids in the ED. Patient improved clinically. Patient placed empirically on IV Rocephin. Fever curve trended down. Blood cultures pending with  no growth to date. Urine cultures with greater than 100,000 Escherichia coli sensitive to cephalosporins and resistant to fluoroquinolones. Leukocytosis resolved. Patient was transitioned to oral ceftin and will be discharged on 4 days of oral ceftin to complete a 1 week course of antibiotics. Outpatient follow up.  #2 Escherichia coli UTI Patient was admitted with sepsis secondary to UTI. Patient initially placed on IV rocephin empirically while urine cultures were obtained. Patient improved clinically, and fever curve trended down. Urine cultures came back with E Coli sensitive to cephalosporins and resistant to fluoroquinolones. Patient was subsequently transitioned to oral ceftin and will be discharged on oral ceftin x 4 days to complete a 1 week course of antibiotics.  #3 hypokalemia/hypomagnesemia Repleted.  #4 diabetes mellitus type 2 Hemoglobin A1c was 7.8. Oral hypoglycemic agents were hold. CBGs were somewhat elevated in 200s and patient placed on lantus 5 units daily as well as SSI. Outpatient follow up.  #5 hypertension Stable. Continued on  Norvasc and Avapro.  #6 hyperlipidemia Continued on home regimen of statin.  #7 OSA on CPAP C Pap daily at bedtime.  #8 insomnia Ambien.  #9 black stools Likely old blood post hemorrhoidal surgery. H&H remained stable. Stool improved.  #10 urinary retention Continue Foley catheter. Flomax. Outpatient follow-up with urology for voiding trial.  Procedures:  CXR 03/30/2015, 03/29/2015    Consultations:  None  Discharge Exam: Filed Vitals:   04/01/15 2125 04/02/15 0528  BP: 140/80 151/74  Pulse: 71 63  Temp: 98.5 F (36.9 C) 98.5 F (36.9 C)  Resp: 20 20    General: NAD Cardiovascular: RRR Respiratory: CTAB  Discharge Instructions   Discharge Instructions    Diet Carb Modified    Complete by:  As directed      Discharge instructions    Complete by:  As directed   Follow up with [name], MD in 2  weeks. Follow up with urology as scheduled. Follow up with general surgery as scheduled.     Increase activity slowly    Complete by:  As directed           Current Discharge Medication List    START taking these medications   Details  cefUROXime (CEFTIN) 500 MG tablet Take 1 tablet (500 mg total) by mouth 2 (two) times daily with a meal. Take for 4 days then stop. Qty: 8 tablet, Refills: 0    polyethylene glycol (MIRALAX / GLYCOLAX) packet Take 17 g by mouth daily as needed for moderate constipation. Qty: 14 each, Refills: 0      CONTINUE these medications which have NOT CHANGED   Details  acetaminophen (TYLENOL) 500 MG tablet Take 1,000 mg by mouth every 6 (six) hours as needed for moderate pain.    aspirin 81 MG tablet Take 81 mg by mouth daily.    atorvastatin (LIPITOR) 20 MG tablet Take 1 pill by mouth once per week Qty: 12 tablet, Refills: 3    CHROMIUM PICOLINATE PO Take 200 mg by mouth daily.    CINNAMON PO Take 200 mg by mouth daily.     glimepiride (AMARYL) 2 MG tablet Take  1 tablet (2 mg total) by mouth daily before breakfast. Qty: 90 tablet, Refills: 1    hydrocortisone (ANUSOL-HC) 2.5 % rectal cream Place 1 application rectally every 6 (six) hours as needed for hemorrhoids or itching. Qty: 30 g, Refills: 2    Lutein 20 MG CAPS Take 20 mg by mouth daily.    Magnesium 200 MG TABS Take 200 mg by mouth daily.    metFORMIN (GLUCOPHAGE) 1000 MG tablet Take 1,000 mg by mouth 2 (two) times daily with a meal.    Multiple Vitamins-Minerals (CENTRUM SILVER PO) Take 1 tablet by mouth daily.     Omega-3 Fatty Acids (FISH OIL PO) Take 1,000 mg by mouth daily.    oxyCODONE (OXY IR/ROXICODONE) 5 MG immediate release tablet Take 1-2 tablets (5-10 mg total) by mouth every 4 (four) hours as needed for moderate pain or severe pain. pain Qty: 30 tablet, Refills: 0    saw palmetto 160 MG capsule Take 160 mg by mouth 2 (two) times daily.    tamsulosin (FLOMAX) 0.4 MG CAPS  capsule Take 1 capsule (0.4 mg total) by mouth 2 (two) times daily. 03/17/15-03/26/15 Qty: 30 capsule, Refills: 0    vitamin E 400 UNIT capsule Take 400 Units by mouth daily.    amLODipine (NORVASC) 5 MG tablet TAKE 1 TABLET (5 MG TOTAL) BY MOUTH DAILY. Qty: 90 tablet, Refills: 3    telmisartan (MICARDIS) 40 MG tablet TAKE 1 TABLET (40 MG TOTAL) BY MOUTH DAILY. Qty: 90 tablet, Refills: 3      STOP taking these medications     ciprofloxacin (CIPRO) 500 MG tablet        No Known Allergies Follow-up Information    Follow up with [name], MD. Schedule an appointment as soon as possible for a visit in 2 weeks.   Specialty:  Family Medicine   Contact information:   [address] [place] [address] [phone]        The results of significant diagnostics from this hospitalization (including imaging, microbiology, ancillary and laboratory) are listed below for reference.    Significant Diagnostic Studies: Dg Chest 2 View  03/29/2015  CLINICAL DATA:  Sepsis and fever. Hemorrhoid surgery 03/13/2015. Remote history of colon cancer. EXAM: CHEST - 2 VIEW COMPARISON:  None. FINDINGS: The heart size is normal. Atherosclerotic changes are noted at the aortic arch. Minimal left basilar airspace disease likely reflects atelectasis with some elevation of the left hemidiaphragm. The right lung is clear. The visualized soft tissues and bony thorax are unremarkable. IMPRESSION: 1. Minimal left basilar airspace disease likely reflects atelectasis. Early infection is considered less likely. 2. Atherosclerotic calcifications at the aortic arch. Electronically Signed   By: San Morelle M.D.   On: 03/29/2015 19:58   Dg Abd Portable 1v  03/30/2015  CLINICAL DATA:  Generalized abdominal pain and constipation. EXAM: PORTABLE ABDOMEN - 1 VIEW COMPARISON:  None. FINDINGS: There is gas throughout nondilated loops of large and small bowel. Bowel sutures are identified within the  lower central pelvis. No evidence for free intraperitoneal air on this supine view. IMPRESSION: Findings most consistent with ileus. Electronically Signed   By: Nolon Nations M.D.   On: 03/30/2015 12:09    Microbiology: Recent Results (from the past 240 hour(s))  Culture, blood (routine x 2)     Status: None (Preliminary result)   Collection Time: 03/29/15  6:16 PM  Result Value Ref Range Status   Specimen Description BLOOD RIGHT ANTECUBITAL  Final  Special Requests BOTTLES DRAWN AEROBIC AND ANAEROBIC 5CC  Final   Culture   Final    NO GROWTH 2 DAYS Performed at [facility]    Report Status PENDING  Incomplete  Culture, blood (routine x 2)     Status: None (Preliminary result)   Collection Time: 03/29/15  6:20 PM  Result Value Ref Range Status   Specimen Description BLOOD LEFT ANTECUBITAL  Final   Special Requests BOTTLES DRAWN AEROBIC AND ANAEROBIC 5CC  Final   Culture   Final    NO GROWTH 2 DAYS Performed at [facility]    Report Status PENDING  Incomplete  Urine culture     Status: None   Collection Time: 03/29/15  6:30 PM  Result Value Ref Range Status   Specimen Description URINE, CLEAN CATCH BAG  Final   Special Requests NONE  Final   Culture   Final    >=100,000 COLONIES/mL ESCHERICHIA COLI Performed at [facility]    Report Status 04/01/2015 FINAL  Final   Organism ID, Bacteria ESCHERICHIA COLI  Final      Susceptibility   Escherichia coli - MIC*    AMPICILLIN >=32 RESISTANT Resistant     CEFAZOLIN <=4 SENSITIVE Sensitive     CEFTRIAXONE <=1 SENSITIVE Sensitive     CIPROFLOXACIN >=4 RESISTANT Resistant     GENTAMICIN <=1 SENSITIVE Sensitive     IMIPENEM <=0.25 SENSITIVE Sensitive     NITROFURANTOIN <=16 SENSITIVE Sensitive     TRIMETH/SULFA <=20 SENSITIVE Sensitive     AMPICILLIN/SULBACTAM 16 INTERMEDIATE Intermediate     PIP/TAZO 8 SENSITIVE Sensitive     * >=100,000 COLONIES/mL ESCHERICHIA COLI     Labs: Basic Metabolic  Panel:  Recent Labs Lab 03/29/15 1515 03/29/15 2308 03/30/15 0147 03/31/15 0454 04/01/15 0525 04/02/15 0513  NA 135  --  130* 133* 135 137  K 4.0  --  3.4* 3.8 4.0 4.1  CL 97*  --  97* 97* 101 102  CO2 27  --  24 31 30 26   GLUCOSE 208*  --  284* 244* 237* 194*  BUN 20  --  17 13 11 11   CREATININE 1.24  --  0.93 0.88 0.83 0.77  CALCIUM 9.4  --  7.8* 8.1* 8.1* 8.3*  MG  --  1.4*  --  1.7 1.6* 2.0   Liver Function Tests:  Recent Labs Lab 03/29/15 1515  AST 30  ALT 58  ALKPHOS 81  BILITOT 0.7  PROT 7.3  ALBUMIN 3.5   No results for input(s): LIPASE, AMYLASE in the last 168 hours. No results for input(s): AMMONIA in the last 168 hours. CBC:  Recent Labs Lab 03/29/15 1515 03/30/15 0147 03/31/15 0454 04/01/15 0525 04/02/15 0513  WBC 17.7* 8.9 6.9 6.8 8.1  NEUTROABS 15.5*  --   --   --   --   HGB 13.8 10.8* 11.1* 10.8* 11.2*  HCT 40.8 31.3* 32.8* 31.7* 33.0*  MCV 92.5 91.8 91.4 89.8 91.4  PLT 331 241 237 224 249   Cardiac Enzymes: No results for input(s): CKTOTAL, CKMB, CKMBINDEX, TROPONINI in the last 168 hours. BNP: BNP (last 3 results) No results for input(s): BNP in the last 8760 hours.  ProBNP (last 3 results) No results for input(s): PROBNP in the last 8760 hours.  CBG:  Recent Labs Lab 04/01/15 0739 04/01/15 1133 04/01/15 1633 04/01/15 2123 04/02/15 0731  GLUCAP 203* 258* 233* 225* 211*       Signed:  Tarnesha Ulloa  MD [facility] 04/02/2015, 10:35 AM

## 2015-04-03 ENCOUNTER — Other Ambulatory Visit: Payer: Self-pay | Admitting: Family Medicine

## 2015-04-03 ENCOUNTER — Telehealth: Payer: Self-pay | Admitting: *Deleted

## 2015-04-03 DIAGNOSIS — E785 Hyperlipidemia, unspecified: Secondary | ICD-10-CM

## 2015-04-03 DIAGNOSIS — E119 Type 2 diabetes mellitus without complications: Secondary | ICD-10-CM

## 2015-04-03 DIAGNOSIS — I10 Essential (primary) hypertension: Secondary | ICD-10-CM

## 2015-04-03 NOTE — Telephone Encounter (Signed)
Transition Care Management Follow-up Telephone Call  How have you been since you were released from the hospital? Been better   Do you understand why you were in the hospital? yes   Do you understand the discharge instrcutions? yes  Items Reviewed:  Medications reviewed: yes  Allergies reviewed: yes  Dietary changes reviewed: yes  Referrals reviewed: yes   Functional Questionnaire:   Activities of Daily Living (ADLs):   He states they are independent in the following: ambulation, bathing and hygiene, feeding, continence, grooming, toileting and dressing States they require assistance with the following: none   Any transportation issues/concerns?: no   Any patient concerns? no   Confirmed importance and date/time of follow-up visits scheduled: yes     Confirmed with patient if condition begins to worsen call PCP or go to the ER.  Patient was given the Call-a-Nurse line [phone]: yes Patient was discharged 04/02/15 Patient was discharged to home Patient has an appointment with Dr Yong Channel 04/09/15

## 2015-04-04 LAB — CULTURE, BLOOD (ROUTINE X 2)
CULTURE: NO GROWTH
CULTURE: NO GROWTH

## 2015-04-07 ENCOUNTER — Other Ambulatory Visit (INDEPENDENT_AMBULATORY_CARE_PROVIDER_SITE_OTHER): Payer: Medicare Other

## 2015-04-07 DIAGNOSIS — I10 Essential (primary) hypertension: Secondary | ICD-10-CM

## 2015-04-07 DIAGNOSIS — IMO0002 Reserved for concepts with insufficient information to code with codable children: Secondary | ICD-10-CM

## 2015-04-07 DIAGNOSIS — E1165 Type 2 diabetes mellitus with hyperglycemia: Secondary | ICD-10-CM | POA: Diagnosis not present

## 2015-04-07 DIAGNOSIS — E785 Hyperlipidemia, unspecified: Secondary | ICD-10-CM | POA: Diagnosis not present

## 2015-04-07 DIAGNOSIS — E119 Type 2 diabetes mellitus without complications: Secondary | ICD-10-CM

## 2015-04-07 LAB — LIPID PANEL
CHOL/HDL RATIO: 4
Cholesterol: 108 mg/dL (ref 0–200)
HDL: 26.8 mg/dL — AB (ref >39.00)
LDL Cholesterol: 55 mg/dL (ref 0–99)
NonHDL: 80.94
TRIGLYCERIDES: 129 mg/dL (ref 0.0–149.0)
VLDL: 25.8 mg/dL (ref 0.0–40.0)

## 2015-04-07 LAB — COMPREHENSIVE METABOLIC PANEL
ALK PHOS: 71 U/L (ref 39–117)
ALT: 36 U/L (ref 0–53)
AST: 16 U/L (ref 0–37)
Albumin: 3.6 g/dL (ref 3.5–5.2)
BILIRUBIN TOTAL: 0.6 mg/dL (ref 0.2–1.2)
BUN: 13 mg/dL (ref 6–23)
CALCIUM: 9.6 mg/dL (ref 8.4–10.5)
CO2: 28 meq/L (ref 19–32)
Chloride: 100 mEq/L (ref 96–112)
Creatinine, Ser: 0.96 mg/dL (ref 0.40–1.50)
GFR: 81.3 mL/min (ref >60.00)
GLUCOSE: 188 mg/dL — AB (ref 70–99)
POTASSIUM: 4 meq/L (ref 3.5–5.1)
Sodium: 137 mEq/L (ref 135–145)
Total Protein: 7.1 g/dL (ref 6.0–8.3)

## 2015-04-07 LAB — CBC
HCT: 39.2 % (ref 39.0–52.0)
HEMOGLOBIN: 13.1 g/dL (ref 13.0–17.0)
MCHC: 33.4 g/dL (ref 30.0–36.0)
MCV: 92 fl (ref 78.0–100.0)
PLATELETS: 367 10*3/uL (ref 150.0–400.0)
RBC: 4.26 Mil/uL (ref 4.22–5.81)
RDW: 13.9 % (ref 11.5–15.5)
WBC: 9.4 10*3/uL (ref 4.0–10.5)

## 2015-04-07 LAB — HEMOGLOBIN A1C: Hgb A1c MFr Bld: 8.9 % — ABNORMAL HIGH (ref 4.6–6.5)

## 2015-04-09 ENCOUNTER — Ambulatory Visit (INDEPENDENT_AMBULATORY_CARE_PROVIDER_SITE_OTHER): Payer: Medicare Other | Admitting: Family Medicine

## 2015-04-09 ENCOUNTER — Encounter: Payer: Self-pay | Admitting: Family Medicine

## 2015-04-09 VITALS — BP 130/64 | HR 92 | Temp 98.2°F | Wt 198.0 lb

## 2015-04-09 DIAGNOSIS — N179 Acute kidney failure, unspecified: Secondary | ICD-10-CM | POA: Diagnosis not present

## 2015-04-09 DIAGNOSIS — A419 Sepsis, unspecified organism: Secondary | ICD-10-CM

## 2015-04-09 DIAGNOSIS — R339 Retention of urine, unspecified: Secondary | ICD-10-CM

## 2015-04-09 DIAGNOSIS — E118 Type 2 diabetes mellitus with unspecified complications: Secondary | ICD-10-CM

## 2015-04-09 DIAGNOSIS — N390 Urinary tract infection, site not specified: Secondary | ICD-10-CM

## 2015-04-09 DIAGNOSIS — E1165 Type 2 diabetes mellitus with hyperglycemia: Secondary | ICD-10-CM

## 2015-04-09 MED ORDER — GLIMEPIRIDE 4 MG PO TABS: 4.0000 mg | ORAL_TABLET | Freq: Every day | ORAL | Status: DC

## 2015-04-09 NOTE — Progress Notes (Signed)
Walter Reddish, MD  Subjective:  Walter [name] is a 74 y.o. year old very pleasant male patient who presents for transitional care management and hospital follow up for sepsis due to complicated UTI. Patient was hospitalized from 03/29/2015 to 04/02/2015. A TCM phone call was completed on 04/03/2015. Medical complexity high.  74 year old male with history of hypertension, diabetes, hyperlipidemia, colon cancer status post resection in 1992 who presented to the hospital with fever, chills, confusion and was found to have sepsis due to a complicated UTI. The story started back on March 13 2015 to surgery for hemorrhoidectomy. He had difficulties with constipation and had an increasingly complex bowel regimen at home. 5 days after surgery he presented to the emergency room with difficulty of urination as well as still no inability bowel movement. A Foley catheter was placed due to urinary retention and per patient he had 1750 mL of fluid out of the Foley at least. He had an overnight admission and was discharged the following morning with a Foley catheter in place. He followed up with surgeon on November 21st and the foley catheter was removed but he had a recurrence of urinary retention and was sent to his urologist. Foley catheter was replaced. 2 days later the patient had a fever up to 102.8. Ciprofloxacin was prescribed by phone for likely UTI. Patient was also started on Flomax. Patient started to feel better. But some of the symptoms worsen again and once again had fever, chills, sweating. He had confusion for the first time this point. When he arrived in the emergency room and heart rate of 108, white count of 17.7 thousand, lactic acid 3.3 and a positive UA.  Sepsis due to UTI. Patient was initially placed on Rocephin his culture ended up growing greater than 100,000 Escherichia coli which was sensitive to cephalosporins and resistant to fluoroquinolones. His leukocytosis trended down as well as his fever  on this therapy. Vitals stabilized. He was transitioned to oral Ceftin and discharged for 4 remaining days of Ceftin for a total of 1 week of antibiotics. He also had a chest x-ray which did not show any pneumonia. Abdominal films showed ileus.  He had hypokalemia in the hospital as well as hypomagnesemia which was repleted. The hospitalist asked [place] to repeat his labs which were done in recent days and showed no hypokalemia.  His diabetes was poorly controlled in the hospital. Oral agents were held. His CBGs trended up over 200. He was placed on Lantus and sliding scale insulin in hospital and discharged back on his home oral regimen. Since being home his sugars have trended down from greater than 200 to the 150s to 170s. Sugars were previously better than this as well.  His hypertension and hyperlipidemia were controlled in the hospital. He was continued on C5 and hospital. He used Ambien on hospital.  His constipation resolved with the bowel regimen given. Initially had some dark stools but these are likely thought due to old blood post hemorrhoidal surgery. His CBC was stable before he left.  In regards to urinary retention patient had a Foley catheter that remained in place. He was continued on Flomax. The patient will see surgery tomorrow but he will also see urology next Monday. There is a plan for voiding trial at that time.  See problem oriented charting ROS- since being out of the hospital patient denies any fever chills nausea or vomiting. Denies rash. He has Foley catheter in place. He denies polyuria or dysuria although this would be  difficult to detect due to Foley in place.  Past Medical History-  Patient Active Problem List   Diagnosis Date Noted  . Diabetes mellitus type II, uncontrolled ([place]) 04/23/2007    Priority: High  . Hyperlipidemia 04/23/2007    Priority: Medium  . Obstructive sleep apnea 04/23/2007    Priority: Medium  . Essential hypertension 04/23/2007    Priority:  Medium  . Hallux valgus of right foot 03/26/2014    Priority: Low  . History of colon cancer 01/27/2014    Priority: Low  . IRITIS 12/17/2008    Priority: Low  . 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 BIGEMINY 04/21/2008    Priority: Low  . Urinary retention 04/01/2015  . Type 2 diabetes, HbA1c goal < 7% (HCC)   . Sepsis ([place]) 03/29/2015  . Sepsis secondary to UTI ([place]) 03/29/2015  . OSA on CPAP 03/29/2015  . Complicated UTI (urinary tract infection) 03/29/2015  . Pyrexia   . E. coli UTI   . Acute renal failure (ARF)  03/18/2015  . Acute urinary retention 03/18/2015  . Glucose found in urine on examination 03/18/2015    Medications- reviewed and updated Current Outpatient Prescriptions  Medication Sig Dispense Refill  . amLODipine (NORVASC) 5 MG tablet TAKE 1 TABLET (5 MG TOTAL) BY MOUTH DAILY. 90 tablet 3  . aspirin 81 MG tablet Take 81 mg by mouth daily.    Marland Kitchen atorvastatin (LIPITOR) 20 MG tablet Take 1 pill by mouth once per week 12 tablet 3  . CHROMIUM PICOLINATE PO Take 200 mg by mouth daily.    Marland Kitchen CINNAMON PO Take 200 mg by mouth daily.     Marland Kitchen glimepiride (AMARYL) 2 MG tablet Take 1 tablet (2 mg total) by mouth daily before breakfast. 90 tablet 3  . Lutein 20 MG CAPS Take 20 mg by mouth daily.    . Magnesium 200 MG TABS Take 200 mg by mouth daily.    . metFORMIN (GLUCOPHAGE) 1000 MG tablet Take 1,000 mg by mouth 2 (two) times daily with a meal.    . Multiple Vitamins-Minerals (CENTRUM SILVER PO) Take 1 tablet by mouth daily.     . Omega-3 Fatty Acids (FISH OIL PO) Take 1,000 mg by mouth daily.    . saw palmetto 160 MG capsule Take 160 mg by mouth 2 (two) times daily.    . tamsulosin (FLOMAX) 0.4 MG CAPS capsule Take 1 capsule (0.4 mg total) by mouth 2 (two) times daily. 03/17/15-03/26/15 30 capsule 0  . telmisartan (MICARDIS) 40 MG tablet TAKE 1 TABLET (40 MG TOTAL) BY MOUTH DAILY. 90 tablet 3   . vitamin E 400 UNIT capsule Take 400 Units by mouth daily.    Marland Kitchen acetaminophen (TYLENOL) 500 MG tablet Take 1,000 mg by mouth every 6 (six) hours as needed for moderate pain.    . hydrocortisone (ANUSOL-HC) 2.5 % rectal cream Place 1 application rectally every 6 (six) hours as needed for hemorrhoids or itching. (Patient not taking: Reported on 04/03/2015) 30 g 2  . oxyCODONE (OXY IR/ROXICODONE) 5 MG immediate release tablet Take 1-2 tablets (5-10 mg total) by mouth every 4 (four) hours as needed for moderate pain or severe pain. pain (Patient not taking: Reported on 04/03/2015) 30 tablet 0  . polyethylene glycol (MIRALAX / GLYCOLAX) packet Take 17 g by mouth daily as needed for moderate constipation. (Patient not taking: Reported on 04/09/2015) 14 each 0  Objective: BP 130/64 mmHg  Pulse 92  Temp(Src) 98.2 F (36.8 C)  Wt 198 lb (89.812 kg) Gen: NAD, resting comfortably CV: RRR no murmurs rubs or gallops Lungs: CTAB no crackles, wheeze, rhonchi Abdomen: soft/nontender/nondistended/normal bowel sounds. No rebound or guarding.  Ext: no edema, Foley catheter bag noted on leg Skin: warm, dry Neuro: grossly normal, moves all extremities  Assessment/Plan:  Diabetes mellitus type II, uncontrolled (HCC) S: a1c spiked up from 7.7 or 7.8 up to 8.9 over last month (prolonged period of high stress, lower activity) with recent hospitalizations A/P: continue  Metformin 1g BID increase amaryl 2mg  up to 4 mg. Follow up 3.5 months.    Urinary retention Continue Foley catheter as well as Flomax until urology follow-up. Given patient had UTI leading to sepsis, I wonder if he has underlying BPH. I have asked patient to discuss with urology whether he should have long-term Flomax to help in prevention of another event such as this.  Sepsis secondary to UTI [facility]) Vital signs have stabilized. Leukocytosis has resolved. Patient has finished antibiotic course. Patient  appears to have been healed from sepsis  secondary to UTI. UTI was due to Escherichia coli which was resistant to quinolones. Fortunately sensitive to cephalosporins which he was treated with.  Acute renal failure (ARF)  Patient did have acute renal failure with first case of urinary retention. We have completed a bmet since he has been out of the hospital which shows normalization of his kidney function.   Return precautions advised. Encouraged patient to not hesitate to call us with any questions  Meds ordered this encounter  Medications  . glimepiride (AMARYL) 4 MG tablet    Sig: Take 1 tablet (4 mg total) by mouth daily before breakfast.    Dispense:  90 tablet    Refill:  3

## 2015-04-09 NOTE — Assessment & Plan Note (Signed)
Patient did have acute renal failure with first case of urinary retention. We have completed a bmet since he has been out of the hospital which shows normalization of his kidney function.

## 2015-04-09 NOTE — Assessment & Plan Note (Signed)
Continue Foley catheter as well as Flomax until urology follow-up. Given patient had UTI leading to sepsis, I wonder if he has underlying BPH. I have asked patient to discuss with urology whether he should have long-term Flomax to help in prevention of another event such as this.

## 2015-04-09 NOTE — Patient Instructions (Signed)
Increase amaryl to 4mg   Please do not hesitate to call us if any questions or send me a message. We want to try to avoid hospitalizations if possible. Alert [place] immediately if any fever, chills, changes in health status  Ask Dr. Jeffie Pollock his thoughts on long term flomax use. After a serious infection like this, i would lean toward long term use (as suspect enlarged prostate played a role) but want his opinion.   Cancelled your appointment on 4th- let's have you schedule to see [place] in 3 months to follow up on diabetes.

## 2015-04-09 NOTE — Assessment & Plan Note (Signed)
S: a1c spiked up from 7.7 or 7.8 up to 8.9 over last month (prolonged period of high stress, lower activity) with recent hospitalizations A/P: continue  Metformin 1g BID increase amaryl 2mg  up to 4 mg. Follow up 3.5 months.

## 2015-04-09 NOTE — Assessment & Plan Note (Addendum)
Vital signs have stabilized. Leukocytosis has resolved. Patient has finished antibiotic course. Patient  appears to have been healed from sepsis secondary to UTI. UTI was due to Escherichia coli which was resistant to quinolones. Fortunately sensitive to cephalosporins which he was treated with.

## 2015-04-23 ENCOUNTER — Other Ambulatory Visit: Payer: Medicare Other

## 2015-04-30 ENCOUNTER — Encounter: Payer: Medicare Other | Admitting: Family Medicine

## 2015-05-06 ENCOUNTER — Encounter: Payer: Medicare Other | Admitting: Family Medicine

## 2015-05-13 ENCOUNTER — Other Ambulatory Visit: Payer: Self-pay | Admitting: Urology

## 2015-05-14 ENCOUNTER — Encounter: Payer: Self-pay | Admitting: Family Medicine

## 2015-05-15 NOTE — Patient Instructions (Signed)
Walter [name]  05/15/2015   Your procedure is scheduled on: Tuesday 05/19/2015  Report to [facility]  Entrance take [place]  elevators to 3rd floor to  [place] at  [place] AM.  Call this number if you have problems the morning of surgery [phone]   Remember: ONLY 1 PERSON MAY GO WITH YOU TO SHORT STAY TO GET  READY MORNING OF [place].  Do not eat food or drink liquids :After Midnight.     Take these medicines the morning of surgery with A SIP OF WATER: Amlodipine, Micardis              DO NOT TAKE ANY DIABETIC MEDICATIONS DAY OF YOUR SURGERY!                 BRING CPAP MASK AND TUBING WITH YOU MORNING OF SURGERY!                                You may not have any metal on your body including hair pins and              piercings  Do not wear jewelry, make-up, lotions, powders or perfumes, deodorant             Do not wear nail polish.  Do not shave  48 hours prior to surgery.              Men may shave face and neck.   Do not bring valuables to the hospital. [place].  Contacts, dentures or bridgework may not be worn into surgery.  Leave suitcase in the car. After surgery it may be brought to your room.     Patients discharged the day of surgery will not be allowed to drive home.  Name and phone number of your driver:  Special Instructions: N/A              Please read over the following fact sheets you were given: _____________________________________________________________________             [facility] - Preparing for Surgery Before surgery, you can play an important role.  Because skin is not sterile, your skin needs to be as free of germs as possible.  You can reduce the number of germs on your skin by washing with CHG (chlorahexidine gluconate) soap before surgery.  CHG is an antiseptic cleaner which kills germs and bonds with the skin to continue killing germs even after  washing. Please DO NOT use if you have an allergy to CHG or antibacterial soaps.  If your skin becomes reddened/irritated stop using the CHG and inform your nurse when you arrive at Short Stay. Do not shave (including legs and underarms) for at least 48 hours prior to the first CHG shower.  You may shave your face/neck. Please follow these instructions carefully:  1.  Shower with CHG Soap the night before surgery and the  morning of Surgery.  2.  If you choose to wash your hair, wash your hair first as usual with your  normal  shampoo.  3.  After you shampoo, rinse your hair and body thoroughly to remove the  shampoo.  4.  Use CHG as you would any other liquid soap.  You can apply chg directly  to the skin and wash                       Gently with a scrungie or clean washcloth.  5.  Apply the CHG Soap to your body ONLY FROM THE NECK DOWN.   Do not use on face/ open                           Wound or open sores. Avoid contact with eyes, ears mouth and genitals (private parts).                       Wash face,  Genitals (private parts) with your normal soap.             6.  Wash thoroughly, paying special attention to the area where your surgery  will be performed.  7.  Thoroughly rinse your body with warm water from the neck down.  8.  DO NOT shower/wash with your normal soap after using and rinsing off  the CHG Soap.                9.  Pat yourself dry with a clean towel.            10.  Wear clean pajamas.            11.  Place clean sheets on your bed the night of your first shower and do not  sleep with pets. Day of Surgery : Do not apply any lotions/deodorants the morning of surgery.  Please wear clean clothes to the hospital/surgery center.  FAILURE TO FOLLOW THESE INSTRUCTIONS MAY RESULT IN THE CANCELLATION OF YOUR SURGERY PATIENT SIGNATURE_________________________________  NURSE  SIGNATURE__________________________________  ________________________________________________________________________

## 2015-05-18 ENCOUNTER — Encounter (HOSPITAL_COMMUNITY): Payer: Self-pay

## 2015-05-18 ENCOUNTER — Encounter (HOSPITAL_COMMUNITY)
Admission: RE | Admit: 2015-05-18 | Discharge: 2015-05-18 | Disposition: A | Discharge status: Home / Self Care | Payer: Medicare Other | Source: Ambulatory Visit | Attending: Urology | Admitting: Urology

## 2015-05-18 HISTORY — DX: Other complications of anesthesia, initial encounter: T88.59XA

## 2015-05-18 HISTORY — DX: Adverse effect of unspecified anesthetic, initial encounter: T41.45XA

## 2015-05-18 LAB — CBC
HEMATOCRIT: 39.7 % (ref 39.0–52.0)
HEMOGLOBIN: 13.2 g/dL (ref 13.0–17.0)
MCH: 30.6 pg (ref 26.0–34.0)
MCHC: 33.2 g/dL (ref 30.0–36.0)
MCV: 92.1 fL (ref 78.0–100.0)
Platelets: 195 10*3/uL (ref 150–400)
RBC: 4.31 MIL/uL (ref 4.22–5.81)
RDW: 14.3 % (ref 11.5–15.5)
WBC: 10.8 10*3/uL — AB (ref 4.0–10.5)

## 2015-05-18 LAB — BASIC METABOLIC PANEL
ANION GAP: 16 — AB (ref 5–15)
BUN: 28 mg/dL — ABNORMAL HIGH (ref 6–20)
CALCIUM: 9.6 mg/dL (ref 8.9–10.3)
CO2: 22 mmol/L (ref 22–32)
Chloride: 96 mmol/L — ABNORMAL LOW (ref 101–111)
Creatinine, Ser: 1.61 mg/dL — ABNORMAL HIGH (ref 0.61–1.24)
GFR, EST AFRICAN AMERICAN: 47 mL/min — AB (ref >60)
GFR, EST NON AFRICAN AMERICAN: 40 mL/min — AB (ref >60)
Glucose, Bld: 309 mg/dL — ABNORMAL HIGH (ref 65–99)
POTASSIUM: 4.9 mmol/L (ref 3.5–5.1)
SODIUM: 134 mmol/L — AB (ref 135–145)

## 2015-05-19 ENCOUNTER — Other Ambulatory Visit: Payer: Self-pay | Admitting: Urology

## 2015-05-19 ENCOUNTER — Encounter (HOSPITAL_COMMUNITY): Payer: Self-pay | Admitting: *Deleted

## 2015-05-19 ENCOUNTER — Encounter (HOSPITAL_COMMUNITY): Payer: Self-pay | Admitting: Anesthesiology

## 2015-05-19 ENCOUNTER — Encounter (HOSPITAL_COMMUNITY)
Admission: RE | Disposition: A | Discharge status: Home / Self Care | Payer: Self-pay | Source: Ambulatory Visit | Attending: Urology

## 2015-05-19 ENCOUNTER — Inpatient Hospital Stay (HOSPITAL_COMMUNITY)
Admission: RE | Admit: 2015-05-19 | Discharge: 2015-05-25 | DRG: 853 | Disposition: A | Discharge status: Home / Self Care | Payer: Medicare Other | Source: Ambulatory Visit | Attending: Urology | Admitting: Urology

## 2015-05-19 ENCOUNTER — Observation Stay (HOSPITAL_COMMUNITY): Payer: Medicare Other

## 2015-05-19 DIAGNOSIS — Z85038 Personal history of other malignant neoplasm of large intestine: Secondary | ICD-10-CM

## 2015-05-19 DIAGNOSIS — B3749 Other urogenital candidiasis: Secondary | ICD-10-CM | POA: Diagnosis present

## 2015-05-19 DIAGNOSIS — I959 Hypotension, unspecified: Secondary | ICD-10-CM | POA: Diagnosis present

## 2015-05-19 DIAGNOSIS — Z79899 Other long term (current) drug therapy: Secondary | ICD-10-CM

## 2015-05-19 DIAGNOSIS — E119 Type 2 diabetes mellitus without complications: Secondary | ICD-10-CM | POA: Diagnosis present

## 2015-05-19 DIAGNOSIS — R3914 Feeling of incomplete bladder emptying: Secondary | ICD-10-CM | POA: Diagnosis present

## 2015-05-19 DIAGNOSIS — Z8619 Personal history of other infectious and parasitic diseases: Secondary | ICD-10-CM

## 2015-05-19 DIAGNOSIS — J189 Pneumonia, unspecified organism: Secondary | ICD-10-CM

## 2015-05-19 DIAGNOSIS — E1165 Type 2 diabetes mellitus with hyperglycemia: Secondary | ICD-10-CM | POA: Diagnosis present

## 2015-05-19 DIAGNOSIS — E785 Hyperlipidemia, unspecified: Secondary | ICD-10-CM | POA: Diagnosis present

## 2015-05-19 DIAGNOSIS — I10 Essential (primary) hypertension: Secondary | ICD-10-CM | POA: Diagnosis present

## 2015-05-19 DIAGNOSIS — E1159 Type 2 diabetes mellitus with other circulatory complications: Secondary | ICD-10-CM | POA: Diagnosis present

## 2015-05-19 DIAGNOSIS — R339 Retention of urine, unspecified: Secondary | ICD-10-CM | POA: Diagnosis present

## 2015-05-19 DIAGNOSIS — Z87440 Personal history of urinary (tract) infections: Secondary | ICD-10-CM

## 2015-05-19 DIAGNOSIS — D649 Anemia, unspecified: Secondary | ICD-10-CM | POA: Diagnosis present

## 2015-05-19 DIAGNOSIS — A419 Sepsis, unspecified organism: Principal | ICD-10-CM | POA: Diagnosis present

## 2015-05-19 DIAGNOSIS — Z01812 Encounter for preprocedural laboratory examination: Secondary | ICD-10-CM

## 2015-05-19 DIAGNOSIS — N179 Acute kidney failure, unspecified: Secondary | ICD-10-CM | POA: Diagnosis present

## 2015-05-19 DIAGNOSIS — Y95 Nosocomial condition: Secondary | ICD-10-CM | POA: Diagnosis present

## 2015-05-19 DIAGNOSIS — Z833 Family history of diabetes mellitus: Secondary | ICD-10-CM

## 2015-05-19 DIAGNOSIS — G4733 Obstructive sleep apnea (adult) (pediatric): Secondary | ICD-10-CM | POA: Diagnosis present

## 2015-05-19 DIAGNOSIS — Z7982 Long term (current) use of aspirin: Secondary | ICD-10-CM

## 2015-05-19 DIAGNOSIS — I152 Hypertension secondary to endocrine disorders: Secondary | ICD-10-CM | POA: Diagnosis present

## 2015-05-19 DIAGNOSIS — R338 Other retention of urine: Secondary | ICD-10-CM | POA: Diagnosis present

## 2015-05-19 DIAGNOSIS — N138 Other obstructive and reflux uropathy: Secondary | ICD-10-CM | POA: Diagnosis present

## 2015-05-19 DIAGNOSIS — Z800 Family history of malignant neoplasm of digestive organs: Secondary | ICD-10-CM

## 2015-05-19 DIAGNOSIS — E1169 Type 2 diabetes mellitus with other specified complication: Secondary | ICD-10-CM | POA: Diagnosis present

## 2015-05-19 DIAGNOSIS — Z9049 Acquired absence of other specified parts of digestive tract: Secondary | ICD-10-CM

## 2015-05-19 DIAGNOSIS — N390 Urinary tract infection, site not specified: Secondary | ICD-10-CM

## 2015-05-19 DIAGNOSIS — N401 Enlarged prostate with lower urinary tract symptoms: Secondary | ICD-10-CM | POA: Diagnosis present

## 2015-05-19 DIAGNOSIS — E1100 Type 2 diabetes mellitus with hyperosmolarity without nonketotic hyperglycemic-hyperosmolar coma (NKHHC): Secondary | ICD-10-CM | POA: Diagnosis not present

## 2015-05-19 DIAGNOSIS — J690 Pneumonitis due to inhalation of food and vomit: Secondary | ICD-10-CM

## 2015-05-19 LAB — CBC
HCT: 36.9 % — ABNORMAL LOW (ref 39.0–52.0)
HCT: 35.9 % — ABNORMAL LOW (ref 39.0–52.0)
HEMOGLOBIN: 12.6 g/dL — AB (ref 13.0–17.0)
Hemoglobin: 12.4 g/dL — ABNORMAL LOW (ref 13.0–17.0)
MCH: 30.7 pg (ref 26.0–34.0)
MCH: 30.5 pg (ref 26.0–34.0)
MCHC: 34.1 g/dL (ref 30.0–36.0)
MCHC: 34.5 g/dL (ref 30.0–36.0)
MCV: 89.8 fL (ref 78.0–100.0)
MCV: 88.2 fL (ref 78.0–100.0)
PLATELETS: 168 10*3/uL (ref 150–400)
PLATELETS: 151 10*3/uL (ref 150–400)
RBC: 4.11 MIL/uL — AB (ref 4.22–5.81)
RBC: 4.07 MIL/uL — AB (ref 4.22–5.81)
RDW: 14.1 % (ref 11.5–15.5)
RDW: 14.1 % (ref 11.5–15.5)
WBC: 9.1 10*3/uL (ref 4.0–10.5)
WBC: 8.2 10*3/uL (ref 4.0–10.5)

## 2015-05-19 LAB — COMPREHENSIVE METABOLIC PANEL
ALBUMIN: 3.8 g/dL (ref 3.5–5.0)
ALK PHOS: 51 U/L (ref 38–126)
ALT: 26 U/L (ref 17–63)
AST: 21 U/L (ref 15–41)
Anion gap: 12 (ref 5–15)
BUN: 28 mg/dL — ABNORMAL HIGH (ref 6–20)
CALCIUM: 9.2 mg/dL (ref 8.9–10.3)
CHLORIDE: 94 mmol/L — AB (ref 101–111)
CO2: 24 mmol/L (ref 22–32)
CREATININE: 1.72 mg/dL — AB (ref 0.61–1.24)
GFR calc Af Amer: 43 mL/min — ABNORMAL LOW (ref >60)
GFR calc non Af Amer: 37 mL/min — ABNORMAL LOW (ref >60)
GLUCOSE: 315 mg/dL — AB (ref 65–99)
Potassium: 4.4 mmol/L (ref 3.5–5.1)
SODIUM: 130 mmol/L — AB (ref 135–145)
Total Bilirubin: 0.9 mg/dL (ref 0.3–1.2)
Total Protein: 7.5 g/dL (ref 6.5–8.1)

## 2015-05-19 LAB — PROCALCITONIN: PROCALCITONIN: 0.6 ng/mL

## 2015-05-19 LAB — GLUCOSE, CAPILLARY
GLUCOSE-CAPILLARY: 162 mg/dL — AB (ref 65–99)
Glucose-Capillary: 296 mg/dL — ABNORMAL HIGH (ref 65–99)
Glucose-Capillary: 288 mg/dL — ABNORMAL HIGH (ref 65–99)
Glucose-Capillary: 236 mg/dL — ABNORMAL HIGH (ref 65–99)

## 2015-05-19 LAB — URINALYSIS, ROUTINE W REFLEX MICROSCOPIC
Bilirubin Urine: NEGATIVE
Glucose, UA: 500 mg/dL — AB
KETONES UR: NEGATIVE mg/dL
NITRITE: NEGATIVE
PROTEIN: NEGATIVE mg/dL
Specific Gravity, Urine: 1.021 (ref 1.005–1.030)
pH: 5.5 (ref 5.0–8.0)

## 2015-05-19 LAB — URINE MICROSCOPIC-ADD ON

## 2015-05-19 LAB — LACTIC ACID, PLASMA
Lactic Acid, Venous: 1.9 mmol/L (ref 0.5–2.0)
Lactic Acid, Venous: 1.9 mmol/L (ref 0.5–2.0)

## 2015-05-19 LAB — CREATININE, SERUM
CREATININE: 1.68 mg/dL — AB (ref 0.61–1.24)
GFR calc Af Amer: 45 mL/min — ABNORMAL LOW (ref >60)
GFR calc non Af Amer: 38 mL/min — ABNORMAL LOW (ref >60)

## 2015-05-19 SURGERY — TURP (TRANSURETHRAL RESECTION OF PROSTATE)
Anesthesia: General

## 2015-05-19 MED ORDER — ACETAMINOPHEN 325 MG PO TABS: 650.0000 mg | ORAL_TABLET | ORAL | Status: DC | PRN

## 2015-05-19 MED ORDER — ONDANSETRON HCL 4 MG/2ML IJ SOLN: 4.0000 mg | INTRAMUSCULAR | Status: DC | PRN

## 2015-05-19 MED ORDER — HYDROMORPHONE HCL 1 MG/ML IJ SOLN: 0.5000 mg | INTRAMUSCULAR | Status: DC | PRN

## 2015-05-19 MED ORDER — ENOXAPARIN SODIUM 30 MG/0.3ML ~~LOC~~ SOLN
30.0000 mg | SUBCUTANEOUS | Status: DC
  Filled 2015-05-19: qty 0.3

## 2015-05-19 MED ORDER — HYDROCORTISONE 2.5 % RE CREA
1.0000 "application " | TOPICAL_CREAM | Freq: Four times a day (QID) | RECTAL | Status: DC | PRN
  Filled 2015-05-19: qty 28.35

## 2015-05-19 MED ORDER — PIPERACILLIN-TAZOBACTAM 3.375 G IVPB
3.3750 g | Freq: Three times a day (TID) | INTRAVENOUS | Status: DC
  Administered 2015-05-19 – 2015-05-24 (×15): 3.375 g via INTRAVENOUS
  Filled 2015-05-19 (×17): qty 50

## 2015-05-19 MED ORDER — ASPIRIN 81 MG PO CHEW
81.0000 mg | CHEWABLE_TABLET | Freq: Every day | ORAL | Status: DC
  Administered 2015-05-19 – 2015-05-25 (×6): 81 mg via ORAL
  Filled 2015-05-19 (×7): qty 1

## 2015-05-19 MED ORDER — ENOXAPARIN SODIUM 40 MG/0.4ML ~~LOC~~ SOLN
40.0000 mg | SUBCUTANEOUS | Status: DC
  Administered 2015-05-19 – 2015-05-21 (×3): 40 mg via SUBCUTANEOUS
  Filled 2015-05-19 (×4): qty 0.4

## 2015-05-19 MED ORDER — INSULIN ASPART 100 UNIT/ML ~~LOC~~ SOLN
0.0000 [IU] | Freq: Three times a day (TID) | SUBCUTANEOUS | Status: DC
  Administered 2015-05-19 – 2015-05-20 (×2): 3 [IU] via SUBCUTANEOUS
  Administered 2015-05-20: 2 [IU] via SUBCUTANEOUS
  Administered 2015-05-20: 3 [IU] via SUBCUTANEOUS
  Administered 2015-05-21: 2 [IU] via SUBCUTANEOUS
  Administered 2015-05-21: 5 [IU] via SUBCUTANEOUS
  Administered 2015-05-21 – 2015-05-22 (×2): 3 [IU] via SUBCUTANEOUS
  Administered 2015-05-22: 1 [IU] via SUBCUTANEOUS
  Administered 2015-05-22 – 2015-05-23 (×2): 3 [IU] via SUBCUTANEOUS
  Administered 2015-05-23: 1 [IU] via SUBCUTANEOUS
  Administered 2015-05-24: 2 [IU] via SUBCUTANEOUS
  Administered 2015-05-24 – 2015-05-25 (×2): 3 [IU] via SUBCUTANEOUS

## 2015-05-19 MED ORDER — TAMSULOSIN HCL 0.4 MG PO CAPS
0.4000 mg | ORAL_CAPSULE | Freq: Two times a day (BID) | ORAL | Status: DC
  Administered 2015-05-19 – 2015-05-25 (×12): 0.4 mg via ORAL
  Filled 2015-05-19 (×12): qty 1

## 2015-05-19 MED ORDER — HYDROCODONE-ACETAMINOPHEN 5-325 MG PO TABS: 1.0000 | ORAL_TABLET | ORAL | Status: DC | PRN

## 2015-05-19 MED ORDER — CEFAZOLIN SODIUM-DEXTROSE 2-3 GM-% IV SOLR: 2.0000 g | INTRAVENOUS | Status: DC

## 2015-05-19 MED ORDER — BISACODYL 10 MG RE SUPP
10.0000 mg | Freq: Every day | RECTAL | Status: DC | PRN
  Filled 2015-05-19: qty 1

## 2015-05-19 MED ORDER — POLYETHYLENE GLYCOL 3350 17 G PO PACK
17.0000 g | PACK | Freq: Every day | ORAL | Status: DC | PRN
  Administered 2015-05-20 (×2): 17 g via ORAL
  Filled 2015-05-19 (×2): qty 1

## 2015-05-19 MED ORDER — MAGNESIUM HYDROXIDE 400 MG/5ML PO SUSP
30.0000 mL | Freq: Every day | ORAL | Status: DC | PRN
  Filled 2015-05-19: qty 30

## 2015-05-19 MED ORDER — SODIUM CHLORIDE 0.9 % IV SOLN
INTRAVENOUS | Status: DC
  Administered 2015-05-19 – 2015-05-25 (×13): via INTRAVENOUS

## 2015-05-19 MED ORDER — GLIMEPIRIDE 4 MG PO TABS
4.0000 mg | ORAL_TABLET | Freq: Every day | ORAL | Status: DC
  Administered 2015-05-20 – 2015-05-25 (×6): 4 mg via ORAL
  Filled 2015-05-19 (×6): qty 1

## 2015-05-19 MED ORDER — SODIUM CHLORIDE 0.9 % IV BOLUS (SEPSIS)
500.0000 mL | Freq: Once | INTRAVENOUS | Status: AC
  Administered 2015-05-19: 500 mL via INTRAVENOUS

## 2015-05-19 NOTE — H&P (Signed)
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   He was seen in preop yesterday and had a glucose of 300 and a Cr of 1.69 with tachycardia and hypotension.   He had been feeling bad over the weekend and stopped the septra he had been taking and was feeling better today.   His symptoms were similar to his prior sepsis episode a few weeks ago.  His HR is 108 today and he is relatively hypotensive with a glucose of 296.  CBC, lactate and repeat CMP are pending.   I have cancelled his TURP and will admit him for possible sepsis.   ROS:  Review of Systems  Constitutional: Positive for malaise/fatigue. Negative for fever and chills.  Respiratory: Negative for shortness of breath.   Cardiovascular: Negative for chest pain.  Gastrointestinal: Negative for abdominal pain.  Genitourinary: Negative.   Musculoskeletal: Negative.   All other systems reviewed and are negative.   No Known Allergies  Past Medical History  Diagnosis Date  . Hypertension   . Hyperlipidemia   . Diabetes mellitus   . History of shingles   . Colon cancer ([place]) 1992    colon resection 1992  . Sleep apnea     cpap  . Complication of anesthesia     bladder problems after surgery from Anesthesia- can not urinate    Past Surgical History  Procedure Laterality Date  . Colectomy  1992  . Appendectomy  1949  . Hemorrhoid surgery      Social History   Social History  . Marital Status: Married    Spouse Name: N/A  . Number of Children: N/A  . Years of Education: N/A   Occupational History  . Not on file.   Social History Main Topics  . Smoking status: Never Smoker   . Smokeless tobacco: Never Used     Comment: smoked for less that 6 months in 1960  . Alcohol Use: 1.2 oz/week    2 Cans of beer per week     Comment: 2 cans previously, cut out now to help with diabetes control. rare  . Drug Use: No  . Sexual Activity: Not on file   Other Topics Concern  .  Not on file   Social History Narrative   Married 1966. 2 daughters Sharee Pimple divorced lives in [place] works for [employer] no kids and Mateo Flow never married in [place], [place] working for [employer] for [employer] improvement no kids.       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 Cancer      colon/fhx  . Colon cancer Father 77  . Rectal cancer Maternal Uncle   . Colon cancer Paternal Grandmother 43  . Diabetes Father     type I  . Alzheimer's disease Mother     Anti-infectives: Anti-infectives    Start     Dose/Rate Route Frequency Ordered Stop   05/19/15 0933  ceFAZolin (ANCEF) IVPB 2 g/50 mL premix     2 g 100 mL/hr over 30 Minutes Intravenous 30 min pre-op 05/19/15 [id number]        Current Facility-Administered Medications  Medication Dose Route Frequency Provider Last Rate Last Dose  . 0.9 %  sodium chloride infusion  Intravenous Continuous Irine Seal, MD      . acetaminophen (TYLENOL) tablet 650 mg  650 mg Oral Q4H PRN Irine Seal, MD      . bisacodyl (DULCOLAX) suppository 10 mg  10 mg Rectal Daily PRN Irine Seal, MD      . ceFAZolin (ANCEF) IVPB 2 g/50 mL premix  2 g Intravenous 30 min Pre-Op Irine Seal, MD      . enoxaparin (LOVENOX) injection 30 mg  30 mg Subcutaneous Q24H Irine Seal, MD      . HYDROcodone-acetaminophen (NORCO/VICODIN) 5-325 MG per tablet 1-2 tablet  1-2 tablet Oral Q4H PRN Irine Seal, MD      . HYDROmorphone (DILAUDID) injection 0.5-1 mg  0.5-1 mg Intravenous Q2H PRN Irine Seal, MD      . magnesium hydroxide (MILK OF MAGNESIA) suspension 30 mL  30 mL Oral Daily PRN Irine Seal, MD      . ondansetron [facility]) injection 4 mg  4 mg Intravenous Q4H PRN Irine Seal, MD         Objective: Vital signs in last 24 hours: Temp:  [97.4 F (36.3 C)-97.9 F (36.6 C)] 97.4 F  (36.3 C) (01/17 0955) Pulse Rate:  [101-108] 108 (01/17 0955) Resp:  [16-18] 18 (01/17 0955) BP: (105-106)/(43-59) 106/59 mmHg (01/17 0955) SpO2:  [94 %-96 %] 94 % (01/17 0955) Weight:  [90.89 kg (200 lb 6 oz)-90.901 kg (200 lb 6.4 oz)] 90.89 kg (200 lb 6 oz) (01/17 1006)  Intake/Output from previous day:   Intake/Output this shift:     Physical Exam  Constitutional: He is oriented to person, place, and time and well-developed, well-nourished, and in no distress.  HENT:  Head: Normocephalic and atraumatic.  Neck: Normal range of motion. Neck supple.  Cardiovascular: Regular rhythm.   tachycardia  Pulmonary/Chest: Effort normal and breath sounds normal. No respiratory distress.  Abdominal: Soft. There is no tenderness.  Genitourinary:  Foley indwelling  Musculoskeletal: Normal range of motion. He exhibits no edema or tenderness.  Neurological: He is alert and oriented to person, place, and time.  Skin: Skin is warm and dry.  But flushed  Psychiatric: Mood and affect normal.  Vitals reviewed.   Lab Results:   Recent Labs  05/18/15 0825 05/19/15 1000  WBC 10.8* 9.1  HGB 13.2 12.6*  HCT 39.7 36.9*  PLT 195 168   BMET  Recent Labs  05/18/15 0825  NA 134*  K 4.9  CL 96*  CO2 22  GLUCOSE 309*  BUN 28*  CREATININE 1.61*  CALCIUM 9.6   PT/INR No results for input(s): LABPROT, INR in the last 72 hours. ABG No results for input(s): PHART, HCO3 in the last 72 hours.  Invalid input(s): PCO2, PO2  Studies/Results: No results found.  Lab results reviewed.   Assessment: He has possible sepsis despite coverage with bactrim post urodynamics.  I have ordered repeat labs and will admit him for IV fluids and culture.  I will consult the hospitalist service to aid in management and antibiotic selection.   His WBC is better today.  It is possible he could have had a reaction to the sulfa but I don't think it is safe to proceed with surgery today and I have  cancelled the TURP.   Depending on his response to therapy, I will consider proceeding with the TURP on Saturday.          Greyson Riccardi J 05/19/2015 [phone]

## 2015-05-19 NOTE — Progress Notes (Signed)
Report called to Crista Curb RN on [place]. Patient transferred via w/c to [place] room [phone]

## 2015-05-19 NOTE — Consult Note (Addendum)
[facility] History and Physical  Dangel Mathe [id number] DOB: [birth year] DOA: 05/19/2015  Referring physician: *  PCP: [name], MD   Reason for consult  Medical mx ,    HPI:  75 year old male with a past medical history significant for HTN, DM type II, HLD, colon cancer s/p resection in 1992, OSA on CPAP; who was admitted directly from PACU by Dr. Jeffie Pollock, hypertension, worsening renal function, confusion, weakness, uncontrolled CBG.   Patient has had   difficulty with urination/retention requiring replacement of Foley catheter since November 2016, was admitted  11/27- 12/1  for urinary tract infection  after he failed outpatient ciprofloxacin. He was admitted with sepsis physiology. He was transitioned to Ceftin and discharged home with indwelling Foley and advised to follow-up with urology. Patient was also started on Flomax , was scheduled for TURP, today, patient was also prescribed one week of Septra and ciprofloxacin perioperatively to cover him for recurrent UTIs. Upon arrival to PACU, he was found to have a CBG of 300, creatinine 1.69, procedure was canceled and the patient was admitted for possible sepsis.lactic acid was found to be 1.9.     Review of Systems: negative for the following  Constitutional: Denies fever, chills, diaphoresis, appetite change and positive for fatigue.  HEENT: Denies photophobia, eye pain, redness, hearing loss, ear pain, congestion, sore throat, rhinorrhea, sneezing, mouth sores, trouble swallowing, neck pain, neck stiffness and tinnitus.  Respiratory: Denies SOB, DOE, cough, chest tightness, and wheezing.  Cardiovascular:tachycardia , Denies chest pain, palpitations and leg swelling.  Gastrointestinal: Denies nausea, vomiting, abdominal pain, diarrhea, constipation, blood in stool and abdominal distention.  Genitourinary: Denies dysuria, urgency, frequency, hematuria, flank pain and difficulty urinating.  Musculoskeletal: Denies myalgias, back  pain, joint swelling, arthralgias and gait problem.  Skin: Denies pallor, rash and wound.  Neurological: Denies dizziness, seizures, syncope, weakness, light-headedness, numbness and headaches.  Hematological: Denies adenopathy. Easy bruising, personal or family bleeding history  Psychiatric/Behavioral: Denies suicidal ideation, mood changes, confusion, nervousness, sleep disturbance and agitation       Past Medical History  Diagnosis Date  . Hypertension   . Hyperlipidemia   . Diabetes mellitus   . History of shingles   . Colon cancer ([place]) 1992    colon resection 1992  . Sleep apnea     cpap  . Complication of anesthesia     bladder problems after surgery from Anesthesia- can not urinate     Past Surgical History  Procedure Laterality Date  . Colectomy  1992  . Appendectomy  1949  . Hemorrhoid surgery        Social History:  reports that he has never smoked. He has never used smokeless tobacco. He reports that he drinks about 1.2 oz of alcohol per week. He reports that he does not use illicit drugs.    No Known Allergies  Family History  Problem Relation Age of Onset  . Cancer      colon/fhx  . Colon cancer Father 76  . Rectal cancer Maternal Uncle   . Colon cancer Paternal Grandmother 40  . Diabetes Father     type I  . Alzheimer's disease Mother          Prior to Admission medications   Medication Sig Start Date End Date Taking? Authorizing Provider  acetaminophen (TYLENOL) 500 MG tablet Take 1,000 mg by mouth every 6 (six) hours as needed for moderate pain.   Yes Historical Provider, MD  amLODipine (NORVASC) 5 MG tablet TAKE 1  TABLET (5 MG TOTAL) BY MOUTH DAILY. 03/30/15  Yes [name], MD  aspirin 81 MG tablet Take 81 mg by mouth daily.   Yes Historical Provider, MD  atorvastatin (LIPITOR) 20 MG tablet Take 1 pill by mouth once per week Patient taking differently: Take 20 mg by mouth once a week.  04/21/14  Yes [name], MD  BETA  CAROTENE PO Take by mouth.   Yes Historical Provider, MD  CHROMIUM PICOLINATE PO Take 200 mg by mouth daily.   Yes Historical Provider, MD  CINNAMON PO Take 200 mg by mouth daily.    Yes Historical Provider, MD  glimepiride (AMARYL) 4 MG tablet Take 1 tablet (4 mg total) by mouth daily before breakfast. 04/09/15  Yes [name], MD  metFORMIN (GLUCOPHAGE) 1000 MG tablet Take 1,000 mg by mouth 2 (two) times daily with a meal.   Yes Historical Provider, MD  Multiple Vitamins-Minerals (CENTRUM SILVER PO) Take 1 tablet by mouth daily.    Yes Historical Provider, MD  Polyethyl Glycol-Propyl Glycol (SYSTANE OP) Apply to eye.   Yes Historical Provider, MD  saw palmetto 160 MG capsule Take 160 mg by mouth daily.    Yes Historical Provider, MD  tamsulosin (FLOMAX) 0.4 MG CAPS capsule Take 1 capsule (0.4 mg total) by mouth 2 (two) times daily. 03/17/15-03/26/15 03/19/15  Yes Michael Boston, MD  telmisartan (MICARDIS) 40 MG tablet TAKE 1 TABLET (40 MG TOTAL) BY MOUTH DAILY. 03/30/15  Yes [name], MD  hydrocortisone (ANUSOL-HC) 2.5 % rectal cream Place 1 application rectally every 6 (six) hours as needed for hemorrhoids or itching. Patient not taking: Reported on 04/03/2015 03/19/15   Michael Boston, MD  oxyCODONE (OXY IR/ROXICODONE) 5 MG immediate release tablet Take 1-2 tablets (5-10 mg total) by mouth every 4 (four) hours as needed for moderate pain or severe pain. pain Patient not taking: Reported on 04/03/2015 03/19/15   Michael Boston, MD  polyethylene glycol [facility] / Floria Raveling) packet Take 17 g by mouth daily as needed for moderate constipation. Patient not taking: Reported on 05/14/2015 04/02/15   Eugenie Filler, MD     Physical Exam: Filed Vitals:   05/19/15 0955 05/19/15 1006 05/19/15 1237 05/19/15 1337  BP: 106/59  99/54 102/57  Pulse: 108  87   Temp: 97.4 F (36.3 C)  98.1 F (36.7 C) 98 F (36.7 C)  TempSrc: Oral   Oral  Resp: 18  16 20   Height:  6' (1.829 m)    Weight:  90.89  kg (200 lb 6 oz)    SpO2: 94%  94% 94%     Constitutional: Vital signs reviewed. Patient is a well-developed and well-nourished in no acute distress and cooperative with exam. Alert and oriented x3.  Head: Normocephalic and atraumatic  Ear: TM normal bilaterally  Mouth: no erythema or exudates, MMM  Eyes: PERRL, EOMI, conjunctivae normal, No scleral icterus.  Neck: Supple, Trachea midline normal ROM, No JVD, mass, thyromegaly, or carotid bruit present.  Cardiovascular: RRR, S1 normal, S2 normal, no MRG, pulses symmetric and intact bilaterally  Pulmonary/Chest: CTAB, no wheezes, rales, or rhonchi  Abdominal: Soft. Non-tender, non-distended, bowel sounds are normal, no masses, organomegaly, or guarding present.  GU: no CVA tenderness Musculoskeletal: No joint deformities, erythema, or stiffness, ROM full and no nontender Ext: no edema and no cyanosis, pulses palpable bilaterally (DP and PT)  Hematology: no cervical, inginal, or axillary adenopathy.  Neurological: A&O x3, Strenght is normal and symmetric bilaterally, cranial  nerve II-XII are grossly intact, no focal motor deficit, sensory intact to light touch bilaterally.  Skin: Warm, dry and intact. No rash, cyanosis, or clubbing.  Psychiatric: Normal mood and affect. speech and behavior is normal. Judgment and thought content normal. Cognition and memory are normal.      Data Review   Micro Results No results found for this or any previous visit (from the past 240 hour(s)).  Radiology Reports No results found.   CBC  Recent Labs Lab 05/18/15 0825 05/19/15 1000 05/19/15 1110  WBC 10.8* 9.1 8.2  HGB 13.2 12.6* 12.4*  HCT 39.7 36.9* 35.9*  PLT 195 168 151  MCV 92.1 89.8 88.2  MCH 30.6 30.7 30.5  MCHC 33.2 34.1 34.5  RDW 14.3 14.1 14.1    Chemistries   Recent Labs Lab 05/18/15 0825 05/19/15 1000 05/19/15 1110  NA 134* 130*  --   K 4.9 4.4  --   CL 96* 94*  --   CO2 22 24  --   GLUCOSE 309* 315*  --   BUN 28*  28*  --   CREATININE 1.61* 1.72* 1.68*  CALCIUM 9.6 9.2  --   AST  --  21  --   ALT  --  26  --   ALKPHOS  --  51  --   BILITOT  --  0.9  --    ------------------------------------------------------------------------------------------------------------------ estimated creatinine clearance is 42.3 mL/min (by C-G formula based on Cr of 1.68). ------------------------------------------------------------------------------------------------------------------ No results for input(s): HGBA1C in the last 72 hours. ------------------------------------------------------------------------------------------------------------------ No results for input(s): CHOL, HDL, LDLCALC, TRIG, CHOLHDL, LDLDIRECT in the last 72 hours. ------------------------------------------------------------------------------------------------------------------ No results for input(s): TSH, T4TOTAL, T3FREE, THYROIDAB in the last 72 hours.  Invalid input(s): FREET3 ------------------------------------------------------------------------------------------------------------------ No results for input(s): VITAMINB12, FOLATE, FERRITIN, TIBC, IRON, RETICCTPCT in the last 72 hours.  Coagulation profile No results for input(s): INR, PROTIME in the last 168 hours.  No results for input(s): DDIMER in the last 72 hours.  Cardiac Enzymes No results for input(s): CKMB, TROPONINI, MYOGLOBIN in the last 168 hours.  Invalid input(s): CK ------------------------------------------------------------------------------------------------------------------ Invalid input(s): POCBNP   CBG:  Recent Labs Lab 05/19/15 0931 05/19/15 1242  GLUCAP 296* 288*       EKG: Independently reviewed.     Assessment/Plan Principal Problem:   Sepsis (HCC)-due to HCAP qsofa score >2, hypotensive, acute renal failure Patient started on Zosyn plus vanc Follow blood cultures, narrow coverage  based on culture and sensitivity Source of infection  likely secondary to UTI with chronic indwelling Foley vs lung  CXR showed RLL , UA pending  Patient under the care of Dr. Jeffie Pollock May need surgery during this admission after rx of infection      Diabetes mellitus type II, uncontrolled ([place]) last hemoglobin A1c 8.9, started on sliding scale insulin Continue Amaryl, will also start patient on low-dose Lantus   hold metformin   Acute kidney injury-baseline 0.98, now 1.6, started on IV hydration  Hyperlipidemia-continue statin   Obstructive sleep apnea on CPAP  Essential hypertension- Hold Norvasc and Avapro , all antihypertensive medications     Code Status:   full Family Communication: bedside Disposition Plan: admit   Total time spent 55 minutes.Greater than 50% of this time was spent in counseling, explanation of diagnosis, planning of further management, and coordination of care  [facility] Pager [phone]  If 7PM-7AM, please contact night-coverage [email or web address] Password [email or web address] 05/19/2015, 1:40 PM

## 2015-05-19 NOTE — Anesthesia Preprocedure Evaluation (Deleted)
Anesthesia Evaluation    Reviewed: Allergy & Precautions, NPO status , Patient's Chart, lab work & pertinent test results  History of Anesthesia Complications Negative for: history of anesthetic complications  Airway        Dental   Pulmonary sleep apnea and Continuous Positive Airway Pressure Ventilation ,           Cardiovascular hypertension, Pt. on medications      Neuro/Psych negative neurological ROS  negative psych ROS   GI/Hepatic negative GI ROS, Neg liver ROS,   Endo/Other  diabetes  Renal/GU      Musculoskeletal   Abdominal   Peds  Hematology   Anesthesia Other Findings   Reproductive/Obstetrics                            Anesthesia Physical Anesthesia Plan  ASA: III  Anesthesia Plan: General   Post-op Pain Management:    Induction: Intravenous  Airway Management Planned:   Additional Equipment:   Intra-op Plan:   Post-operative Plan: Extubation in OR  Informed Consent:   Plan Discussed with:   Anesthesia Plan Comments:        Anesthesia Quick Evaluation

## 2015-05-19 NOTE — Progress Notes (Signed)
ANTIBIOTIC CONSULT NOTE - INITIAL  Pharmacy Consult for piperacillin/tazobactam Indication: intra-abdominal infection  No Known Allergies  Patient Measurements: Height: 6' (182.9 cm) Weight: 200 lb 6 oz (90.89 kg) IBW/kg (Calculated) : 77.6 Adjusted Body Weight:   Vital Signs: Temp: 98 F (36.7 C) (01/17 1337) Temp Source: Oral (01/17 1337) BP: 102/57 mmHg (01/17 1337) Pulse Rate: 87 (01/17 1237) Intake/Output from previous day:   Intake/Output from this shift:    Labs:  Recent Labs  05/18/15 0825 05/19/15 1000 05/19/15 1110  WBC 10.8* 9.1 8.2  HGB 13.2 12.6* 12.4*  PLT 195 168 151  CREATININE 1.61* 1.72* 1.68*   Estimated Creatinine Clearance: 42.3 mL/min (by C-G formula based on Cr of 1.68). No results for input(s): VANCOTROUGH, VANCOPEAK, VANCORANDOM, GENTTROUGH, GENTPEAK, GENTRANDOM, TOBRATROUGH, TOBRAPEAK, TOBRARND, AMIKACINPEAK, AMIKACINTROU, AMIKACIN in the last 72 hours.   Microbiology: No results found for this or any previous visit (from the past 720 hour(s)).  Medical History: Past Medical History  Diagnosis Date  . Hypertension   . Hyperlipidemia   . Diabetes mellitus   . History of shingles   . Colon cancer ([place]) 1992    colon resection 1992  . Sleep apnea     cpap  . Complication of anesthesia     bladder problems after surgery from Anesthesia- can not urinate   Assessment: 74 YOM scheduled to have TURP for BPH on 1/17 but cancelled due to fever, chills, diaphoresis, and confusion.  Pharmacy asked to dose zosyn for possible intra-abdominal infection.  Antimicrobials this admission: 1/17 >> pip/tazo   >>  Levels/dose changes this admission:  Microbiology results: 1/17 Blood culture:  1/17 Urine culture:   Goal of Therapy:  Dose for indication and for patient-specific parameters  Plan:   Zosyn 3.375gm IV q8h over 4h infusion  Doreene Eland, PharmD, BCPS.   Pager: [phone] 05/19/2015 1:53 PM

## 2015-05-19 NOTE — H&P (Signed)
Active Problems Problems  1. Acute urinary retention (R33.8) 2. Benign prostatic hypertrophy (BPH) with incomplete bladder emptying (N40.1,R39.14)  History of Present Illness Walter [name] returns today in f/u for urodynamic for his history of postop retention.  He was able to generate a contraction today but it was week and he was only able to void a small amount. He did have visual obstruction on prior cystoscopy.  He has had the foley for about 7 weeks.   Past Medical History Problems  1. History of High cholesterol (E78.00) 2. History of hypertension (Z86.79) 3. History of malignant neoplasm of colon (Z85.038) 4. History of type 2 diabetes mellitus (Z86.39)  Surgical History Problems  1. History of Colon Surgery 2. History of Hemorrhoidectomy 3. History of Septoplasty  Current Meds 1. Amlodipine Besy-Benazepril HCl CAPS;  Therapy: (Recorded:21Nov2016) to Recorded 2. GlipiZIDE TABS;  Therapy: (Recorded:21Nov2016) to Recorded 3. Levsin/SL 0.125 MG Sublingual Tablet Sublingual; Place one tablet under the tongue  every 4-6 hours as needed for spasms;  Therapy: [id number] to (Last Rx:19Dec2016)  Requested for: [id number] Ordered 4. Lipitor 20 MG Oral Tablet;  Therapy: (Recorded:21Nov2016) to Recorded 5. MetFORMIN HCl TABS;  Therapy: (Recorded:21Nov2016) to Recorded 6. Tamsulosin HCl - 0.4 MG Oral Capsule;  Therapy: (Recorded:12Dec2016) to Recorded  Allergies No Known Allergies  1. No Known Allergies  Family History Problems  1. Family history of Colon cancer : Father 2. Family history of diabetes mellitus (Z83.3) : Father  Social History Problems  1. Alcohol use (Z78.9) 2. Caffeine use (F15.90) 3. Father deceased 34. Married 5. Mother deceased  Review of Systems Genitourinary, constitutional, skin, eye, otolaryngeal, hematologic/lymphatic, cardiovascular, pulmonary, endocrine, musculoskeletal, gastrointestinal, neurological and psychiatric system(s) were reviewed and  pertinent findings if present are noted and are otherwise negative.    Physical Exam Constitutional: Well nourished and well developed . No acute distress.  Pulmonary: No respiratory distress and normal respiratory rhythm and effort.  Cardiovascular: Heart rate and rhythm are normal . No peripheral edema.    Procedure  Procedure: Urodynamics  Test indication: Retention.  The procedure's risks, benefits and infection risk were discussed with the patient.  Pre Uroflow & Catheterization  Procedure: Pre Uroflow Study. Equipment And Procedure:  Cystometry  Procedure: Cystometrogram.  The bladder was filled with room temperature water at a rate of less than 50 cc per minute.  After bladder filling, measurements obtained include the maximum cystometric capacity of approx. 958ml and the end filling pressure of 10 cm H2O.  Maximum capacity produced a feeling of bladder fullness and a non-compliant bladder.  Bladder sensations. The first sensation of filling occurred at 236ml. The normal desire to void occurred at 765ml. The strong desire to void occurred at approx. 945ml. The bladder was unstable, the first contraction occurred at 38 mls but probably UD line induced, 2nd contraction ocurred at 365 mlsml, the maximum unstable bladder contraction pressure was 9 cm H2O, no urine leakage occurred and Patient able to inhibit unstable contractions. He could feel these unstable contractions come and go. Injection of contrast was performed for the cystometrogram.  Leak Point Pressure  Procedure: Valsalva Leak Point Pressure.  The patient was placed in the sitting position.  A fluoroscopic method was used to determine leak point pressure.  The patient did not leak and the maximum abdominal pressure generated was 139 cm H2O.  Pressure - Flow Study  Procedure: Pressure-Flow Study.  Findings: voluntary contraction generated, the volume voided was 0 ml and the maximum detrusor pressure was 15 cmH20 after  deducting for the loss of compliance cm H2O.  Electromyogram  Procedure: Electromyogram. Equipment And Procedure:  Electromyogram activity was measured by surface electrodes. There was some increased EMG activity during his attempt to void. Some of this was from position changes and movement. There was not voiding phase.  Fluoroscopy and VCUG  Procedure: Fluoroscopy and VCUG.  During VCUG/Cystogram, the precontrast film showed no bony abnormality.  During bladder filling, the contour was trabeculated. Findings included no reflux. There were no complications.  Nurse Impression: Walter [name] held a max capacity of approx. 993 mls. There was a slight loss of compliance with an end filling pressure of 10 cmH20. He expressed his 1st sensation at 266 mls. There was some low amplitude instability of approx. 9 cmH20 during the filling phase. He could feel slight urges come and go. No leakage was noted. He c/o burning in his penis during filling. The infusion had to be stopped several times until the burning subsided. He tried to void and generated a voluntary contraction, but it was not that strong (approx. 15 cmH20), and he was unable to void. I let him stand to see if this would help. Eventually, he became too uncomfortable and he felt like he needed to have a BM, so the study was stopped. Trabeculation and some elevation of the bladder base were noted. No reflux was seen. He was taken to a BR where he had a BM and voided approx. 40 mls into a hat. He told me he finished his antibiotics last night. With his past hx of hospitalization with UTI, it was decided to give him Septra DS (2 tablets) with instructions to take one now and the 2nd one in the morning.  Procedure was supervised by Dr. Jeffie Pollock    Assessment Assessed  1. Acute urinary retention (R33.8) 2. Benign prostatic hypertrophy (BPH) with incomplete bladder emptying (N40.1,R39.14)  He had a detrusor contraction today but it was weak and he was only  able to void a small amount.   Plan  I discussed the options and will proceed with scheduling a TURP. I reviewed the risks of bleeding, infection, persistent retention, strictures, incontinence, ejaculatory and erectile dysfunction, thrombotic events and anesthetic risks.  He was given septra and cipro to cover the procedure today because of his prior episode of infection.   Discussion/Summary CC: Dr. Michael Boston

## 2015-05-20 DIAGNOSIS — R3914 Feeling of incomplete bladder emptying: Secondary | ICD-10-CM | POA: Diagnosis present

## 2015-05-20 DIAGNOSIS — Z79899 Other long term (current) drug therapy: Secondary | ICD-10-CM | POA: Diagnosis not present

## 2015-05-20 DIAGNOSIS — R338 Other retention of urine: Secondary | ICD-10-CM | POA: Diagnosis present

## 2015-05-20 DIAGNOSIS — Z9049 Acquired absence of other specified parts of digestive tract: Secondary | ICD-10-CM | POA: Diagnosis not present

## 2015-05-20 DIAGNOSIS — G4733 Obstructive sleep apnea (adult) (pediatric): Secondary | ICD-10-CM | POA: Diagnosis present

## 2015-05-20 DIAGNOSIS — N138 Other obstructive and reflux uropathy: Secondary | ICD-10-CM | POA: Diagnosis present

## 2015-05-20 DIAGNOSIS — J189 Pneumonia, unspecified organism: Secondary | ICD-10-CM | POA: Diagnosis present

## 2015-05-20 DIAGNOSIS — I959 Hypotension, unspecified: Secondary | ICD-10-CM | POA: Diagnosis present

## 2015-05-20 DIAGNOSIS — I10 Essential (primary) hypertension: Secondary | ICD-10-CM | POA: Diagnosis present

## 2015-05-20 DIAGNOSIS — Y95 Nosocomial condition: Secondary | ICD-10-CM | POA: Diagnosis present

## 2015-05-20 DIAGNOSIS — Z833 Family history of diabetes mellitus: Secondary | ICD-10-CM | POA: Diagnosis not present

## 2015-05-20 DIAGNOSIS — E785 Hyperlipidemia, unspecified: Secondary | ICD-10-CM | POA: Diagnosis present

## 2015-05-20 DIAGNOSIS — B3749 Other urogenital candidiasis: Secondary | ICD-10-CM | POA: Diagnosis present

## 2015-05-20 DIAGNOSIS — Z8619 Personal history of other infectious and parasitic diseases: Secondary | ICD-10-CM | POA: Diagnosis not present

## 2015-05-20 DIAGNOSIS — Z800 Family history of malignant neoplasm of digestive organs: Secondary | ICD-10-CM | POA: Diagnosis not present

## 2015-05-20 DIAGNOSIS — N401 Enlarged prostate with lower urinary tract symptoms: Secondary | ICD-10-CM | POA: Diagnosis present

## 2015-05-20 DIAGNOSIS — Z01812 Encounter for preprocedural laboratory examination: Secondary | ICD-10-CM | POA: Diagnosis not present

## 2015-05-20 DIAGNOSIS — A419 Sepsis, unspecified organism: Secondary | ICD-10-CM | POA: Diagnosis present

## 2015-05-20 DIAGNOSIS — Z87440 Personal history of urinary (tract) infections: Secondary | ICD-10-CM | POA: Diagnosis not present

## 2015-05-20 DIAGNOSIS — N179 Acute kidney failure, unspecified: Secondary | ICD-10-CM | POA: Diagnosis present

## 2015-05-20 DIAGNOSIS — E1165 Type 2 diabetes mellitus with hyperglycemia: Secondary | ICD-10-CM | POA: Diagnosis present

## 2015-05-20 DIAGNOSIS — E1100 Type 2 diabetes mellitus with hyperosmolarity without nonketotic hyperglycemic-hyperosmolar coma (NKHHC): Secondary | ICD-10-CM | POA: Diagnosis not present

## 2015-05-20 DIAGNOSIS — Z7982 Long term (current) use of aspirin: Secondary | ICD-10-CM | POA: Diagnosis not present

## 2015-05-20 DIAGNOSIS — Z85038 Personal history of other malignant neoplasm of large intestine: Secondary | ICD-10-CM | POA: Diagnosis not present

## 2015-05-20 DIAGNOSIS — D649 Anemia, unspecified: Secondary | ICD-10-CM | POA: Diagnosis present

## 2015-05-20 LAB — CBC
HCT: 32.4 % — ABNORMAL LOW (ref 39.0–52.0)
Hemoglobin: 10.9 g/dL — ABNORMAL LOW (ref 13.0–17.0)
MCH: 30.4 pg (ref 26.0–34.0)
MCHC: 33.6 g/dL (ref 30.0–36.0)
MCV: 90.3 fL (ref 78.0–100.0)
PLATELETS: 144 10*3/uL — AB (ref 150–400)
RBC: 3.59 MIL/uL — ABNORMAL LOW (ref 4.22–5.81)
RDW: 14.3 % (ref 11.5–15.5)
WBC: 5.6 10*3/uL (ref 4.0–10.5)

## 2015-05-20 LAB — COMPREHENSIVE METABOLIC PANEL
ALBUMIN: 3 g/dL — AB (ref 3.5–5.0)
ALK PHOS: 45 U/L (ref 38–126)
ALT: 25 U/L (ref 17–63)
ANION GAP: 11 (ref 5–15)
AST: 20 U/L (ref 15–41)
BILIRUBIN TOTAL: 1 mg/dL (ref 0.3–1.2)
BUN: 20 mg/dL (ref 6–20)
CALCIUM: 8.2 mg/dL — AB (ref 8.9–10.3)
CO2: 25 mmol/L (ref 22–32)
CREATININE: 1.07 mg/dL (ref 0.61–1.24)
Chloride: 96 mmol/L — ABNORMAL LOW (ref 101–111)
GFR calc Af Amer: >60 mL/min (ref >60)
GFR calc non Af Amer: >60 mL/min (ref >60)
GLUCOSE: 186 mg/dL — AB (ref 65–99)
Potassium: 4.3 mmol/L (ref 3.5–5.1)
Sodium: 132 mmol/L — ABNORMAL LOW (ref 135–145)
TOTAL PROTEIN: 6.1 g/dL — AB (ref 6.5–8.1)

## 2015-05-20 LAB — GLUCOSE, CAPILLARY
GLUCOSE-CAPILLARY: 208 mg/dL — AB (ref 65–99)
GLUCOSE-CAPILLARY: 202 mg/dL — AB (ref 65–99)
Glucose-Capillary: 186 mg/dL — ABNORMAL HIGH (ref 65–99)
Glucose-Capillary: 252 mg/dL — ABNORMAL HIGH (ref 65–99)

## 2015-05-20 MED ORDER — VANCOMYCIN HCL IN DEXTROSE 1-5 GM/200ML-% IV SOLN
1000.0000 mg | Freq: Two times a day (BID) | INTRAVENOUS | Status: DC
  Administered 2015-05-21 – 2015-05-22 (×3): 1000 mg via INTRAVENOUS
  Filled 2015-05-20 (×3): qty 200

## 2015-05-20 MED ORDER — VANCOMYCIN HCL IN DEXTROSE 1-5 GM/200ML-% IV SOLN
1000.0000 mg | Freq: Once | INTRAVENOUS | Status: AC
  Administered 2015-05-20: 1000 mg via INTRAVENOUS
  Filled 2015-05-20: qty 200

## 2015-05-20 NOTE — Care Management Obs Status (Signed)
[facility] NOTIFICATION   Patient Details  Name: Walter [name] MRN: [id number] Date of Birth: 16-Sep-1940   Medicare Observation Status Notification Given:  Yes    MahabirJuliann Pulse, RN 05/20/2015, 3:38 PM

## 2015-05-20 NOTE — Progress Notes (Signed)
Patient ID: Walter [name], male   DOB: 12-08-1940, 75 y.o.   MRN: [id number] 1 Day Post-Op  Subjective: Walter [name] is improving.  His Glucose and Creatinine have declined but he has a more pronounced anemia with hydration.   He has no chills.  He denies cough or SOB.   He has no bowel complaints.   Cultures are pending.   He may have pneumonia based on the CXR report.   His prior colon resection was a low anterior resection and that has probably contributed to his recent retention episode.   His lactate was slightly up at 1.9.   He has been placed on vanc and zosyn pending cultures.  ROS:  Review of Systems  Constitutional: Negative for fever and chills.  Respiratory: Negative for cough and shortness of breath.   Cardiovascular: Negative for chest pain.  Gastrointestinal: Negative for nausea, vomiting and abdominal pain.  All other systems reviewed and are negative.   Anti-infectives: Anti-infectives    Start     Dose/Rate Route Frequency Ordered Stop   05/19/15 1500  piperacillin-tazobactam (ZOSYN) IVPB 3.375 g     3.375 g 12.5 mL/hr over 240 Minutes Intravenous 3 times per day 05/19/15 1354     05/19/15 0933  ceFAZolin (ANCEF) IVPB 2 g/50 mL premix  Status:  Discontinued     2 g 100 mL/hr over 30 Minutes Intravenous 30 min pre-op 05/19/15 0933 05/19/15 1312      Current Facility-Administered Medications  Medication Dose Route Frequency Provider Last Rate Last Dose  . 0.9 %  sodium chloride infusion   Intravenous Continuous Irine Seal, MD 100 mL/hr at 05/20/15 0028    . acetaminophen (TYLENOL) tablet 650 mg  650 mg Oral Q4H PRN Irine Seal, MD      . aspirin chewable tablet 81 mg  81 mg Oral Daily Reyne Dumas, MD   81 mg at 05/19/15 1713  . bisacodyl (DULCOLAX) suppository 10 mg  10 mg Rectal Daily PRN Irine Seal, MD      . enoxaparin (LOVENOX) injection 40 mg  40 mg Subcutaneous Q24H Baltazar Najjar [place], RPH   40 mg at 05/19/15 1711  . glimepiride (AMARYL) tablet 4 mg  4 mg Oral QAC breakfast  Reyne Dumas, MD      . HYDROcodone-acetaminophen (NORCO/VICODIN) 5-325 MG per tablet 1-2 tablet  1-2 tablet Oral Q4H PRN Irine Seal, MD      . hydrocortisone (ANUSOL-HC) 2.5 % rectal cream 1 application  1 application Rectal 99991111 PRN Reyne Dumas, MD      . HYDROmorphone (DILAUDID) injection 0.5-1 mg  0.5-1 mg Intravenous Q2H PRN Irine Seal, MD      . insulin aspart (novoLOG) injection 0-9 Units  0-9 Units Subcutaneous TID WC Reyne Dumas, MD   3 Units at 05/19/15 1711  . magnesium hydroxide (MILK OF MAGNESIA) suspension 30 mL  30 mL Oral Daily PRN Irine Seal, MD      . ondansetron [facility]) injection 4 mg  4 mg Intravenous Q4H PRN Irine Seal, MD      . piperacillin-tazobactam (ZOSYN) IVPB 3.375 g  3.375 g Intravenous 3 times per day Berton Mount, RPH   3.375 g at 05/20/15 0553  . polyethylene glycol (MIRALAX / GLYCOLAX) packet 17 g  17 g Oral Daily PRN Reyne Dumas, MD   17 g at 05/20/15 0354  . tamsulosin (FLOMAX) capsule 0.4 mg  0.4 mg Oral BID Reyne Dumas, MD   0.4 mg at 05/19/15 2104  Objective: Vital signs in last 24 hours: Temp:  [97.4 F (36.3 C)-99.4 F (37.4 C)] 98.4 F (36.9 C) (01/18 0515) Pulse Rate:  [82-108] 88 (01/18 0515) Resp:  [16-20] 20 (01/18 0515) BP: (98-107)/(54-62) 107/62 mmHg (01/18 0515) SpO2:  [93 %-94 %] 93 % (01/18 0515) Weight:  [90.89 kg (200 lb 6 oz)] 90.89 kg (200 lb 6 oz) (01/17 1006)  Intake/Output from previous day: 01/17 0701 - 01/18 0700 In: 1858.3 [P.O.:240; I.V.:1518.3; IV Piggyback:100] Out: 2150 [Urine:2150] Intake/Output this shift:     Physical Exam  Constitutional: He is well-developed, well-nourished, and in no distress.  Cardiovascular: Normal rate and regular rhythm.   Pulmonary/Chest: Effort normal and breath sounds normal. No respiratory distress.    Lab Results:   Recent Labs  05/19/15 1110 05/20/15 0543  WBC 8.2 5.6  HGB 12.4* 10.9*  HCT 35.9* 32.4*  PLT 151 144*   BMET  Recent Labs  05/19/15 1000  05/19/15 1110 05/20/15 0543  NA 130*  --  132*  K 4.4  --  4.3  CL 94*  --  96*  CO2 24  --  25  GLUCOSE 315*  --  186*  BUN 28*  --  20  CREATININE 1.72* 1.68* 1.07  CALCIUM 9.2  --  8.2*   PT/INR No results for input(s): LABPROT, INR in the last 72 hours. ABG No results for input(s): PHART, HCO3 in the last 72 hours.  Invalid input(s): PCO2, PO2  Studies/Results: Dg Chest Port 1 View  05/19/2015  CLINICAL DATA:  Hypertension.  Colon carcinoma.  Sepsis. EXAM: PORTABLE CHEST 1 VIEW COMPARISON:  March 29, 2015 FINDINGS: There is a small area of infiltrate in the right lower lobe. There is stable atelectasis in the left base. The lungs elsewhere are clear. Heart size and pulmonary vascularity are normal. No adenopathy. No bone lesions. There is degenerative change in the lower thoracic region. IMPRESSION: Small area of infiltrate, presumably pneumonia, right lower lobe. Stable atelectasis left base. Lungs elsewhere clear. No change in cardiac silhouette. Electronically Signed   By: Lowella Grip III M.D.   On: 05/19/2015 14:36     Assessment and Plan: He has possible pneumonia vs a UTI with AKI. Anemia. DM with improvement in his glucose with hydration and Abx.   I would like to try to do the TURP on Saturday if his condition has improved sufficiently from a medical standpoint.   I will work on scheduling.           Tomoko Sandra J 05/20/2015 [phone]

## 2015-05-20 NOTE — Progress Notes (Signed)
[facility] PROGRESS NOTE  Walter [name] [id number] DOB: [birth year] DOA: 05/19/2015 PCP: [name], MD  Assessment/Plan: 75 year old male with a past medical history significant for HTN, DM type II, HLD, colon cancer s/p resection in 1992, OSA on CPAP; who was admitted directly from PACU by Dr. Jeffie Pollock, hypertension, worsening renal function, confusion, weakness, uncontrolled CBG. Patient has had difficulty with urination/retention requiring replacement of Foley catheter since November 2016, was admitted 11/27- 12/1 for urinary tract infection after he failed outpatient ciprofloxacin. He was admitted with sepsis physiology. He was transitioned to Ceftin and discharged home with indwelling Foley and advised to follow-up with urology. Patient was also started on Flomax , was scheduled for TURP, 1-17, patient was also prescribed one week of Septra and ciprofloxacin perioperatively to cover him for recurrent UTIs. Upon arrival to PACU, he was found to have a CBG of 300, creatinine 1.69, procedure was canceled and the patient was admitted for possible sepsis.lactic acid was found to be 1.9.  HCAP;  Presents with hypotension, renal failure, confusion. Chest x ray with small area of infiltrates.  He was in the hospital 3 weeks ago. Will continue with treatment for Health care associated PNA.  Would repeat chest xr ay on Friday prior to any surgery intervention.   SIRS; Secondary to UTI vs PNA. Presents with Hypotension, tachycardia, AKI.   UTI; follow urine culture. Urology following. On zosyn.  Recurrent UTI, TURP  for BPH with retention , plan on procedure   Diabetes mellitus type II, uncontrolled ([place]) last hemoglobin A1c 8.9, started on sliding scale insulin Continue Amaryl.  hold metformin  Acute kidney injury-baseline 0.98, peak to 1.6. Continue with fluids. Has decrease to 1.07  Hyperlipidemia-continue statin   Obstructive sleep apnea on CPAP  Essential hypertension-  Hold Norvasc and Avapro , all antihypertensive medications    Code Status: Full code.  Family Communication: care discussed with patient  Disposition Plan: remain inpatient for treatment of PNA.    Consultants:  Dr Roni Bread, primary   Procedures:  none  Antibiotics:  Vancomycin 1-18  Zosyn 1-17  HPI/Subjective: He is feeling better, no significant cough, breathing well.   Objective: Filed Vitals:   05/20/15 0515 05/20/15 1415  BP: 107/62 104/51  Pulse: 88 87  Temp: 98.4 F (36.9 C) 98.2 F (36.8 C)  Resp: 20 17    Intake/Output Summary (Last 24 hours) at 05/20/15 1637 Last data filed at 05/20/15 0945  Gross per 24 hour  Intake 1858.33 ml  Output   2525 ml  Net -666.67 ml   Filed Weights   05/19/15 1006  Weight: 90.89 kg (200 lb 6 oz)    Exam:   General:  NAD  Cardiovascular: S 1, S 2 RRR  Respiratory: cta  Abdomen: bs present, soft, nt  Musculoskeletal: no edema  Data Reviewed: Basic Metabolic Panel:  Recent Labs Lab 05/18/15 0825 05/19/15 1000 05/19/15 1110 05/20/15 0543  NA 134* 130*  --  132*  K 4.9 4.4  --  4.3  CL 96* 94*  --  96*  CO2 22 24  --  25  GLUCOSE 309* 315*  --  186*  BUN 28* 28*  --  20  CREATININE 1.61* 1.72* 1.68* 1.07  CALCIUM 9.6 9.2  --  8.2*   Liver Function Tests:  Recent Labs Lab 05/19/15 1000 05/20/15 0543  AST 21 20  ALT 26 25  ALKPHOS 51 45  BILITOT 0.9 1.0  PROT 7.5 6.1*  ALBUMIN 3.8 3.0*  No results for input(s): LIPASE, AMYLASE in the last 168 hours. No results for input(s): AMMONIA in the last 168 hours. CBC:  Recent Labs Lab 05/18/15 0825 05/19/15 1000 05/19/15 1110 05/20/15 0543  WBC 10.8* 9.1 8.2 5.6  HGB 13.2 12.6* 12.4* 10.9*  HCT 39.7 36.9* 35.9* 32.4*  MCV 92.1 89.8 88.2 90.3  PLT 195 168 151 144*   Cardiac Enzymes: No results for input(s): CKTOTAL, CKMB, CKMBINDEX, TROPONINI in the last 168 hours. BNP (last 3 results) No results for input(s): BNP in the last 8760  hours.  ProBNP (last 3 results) No results for input(s): PROBNP in the last 8760 hours.  CBG:  Recent Labs Lab 05/19/15 1242 05/19/15 1700 05/19/15 2134 05/20/15 0728 05/20/15 1216  GLUCAP 288* 236* 162* 208* 202*    Recent Results (from the past 240 hour(s))  Culture, blood (routine x 2)     Status: None (Preliminary result)   Collection Time: 05/19/15 11:10 AM  Result Value Ref Range Status   Specimen Description BLOOD RIGHT ARM  Final   Special Requests BOTTLES DRAWN AEROBIC AND ANAEROBIC 5CC  Final   Culture   Final    NO GROWTH < 24 HOURS Performed at [facility]    Report Status PENDING  Incomplete  Culture, blood (routine x 2)     Status: None (Preliminary result)   Collection Time: 05/19/15 11:15 AM  Result Value Ref Range Status   Specimen Description BLOOD LEFT ARM  Final   Special Requests BOTTLES DRAWN AEROBIC AND ANAEROBIC 5CC  Final   Culture   Final    NO GROWTH < 24 HOURS Performed at [facility]    Report Status PENDING  Incomplete  Urine culture     Status: None (Preliminary result)   Collection Time: 05/19/15 11:43 AM  Result Value Ref Range Status   Specimen Description URINE, CATHETERIZED  Final   Special Requests NONE  Final   Culture   Final    TOO YOUNG TO READ Performed at [facility]    Report Status PENDING  Incomplete     Studies: Dg Chest Port 1 View  05/19/2015  CLINICAL DATA:  Hypertension.  Colon carcinoma.  Sepsis. EXAM: PORTABLE CHEST 1 VIEW COMPARISON:  March 29, 2015 FINDINGS: There is a small area of infiltrate in the right lower lobe. There is stable atelectasis in the left base. The lungs elsewhere are clear. Heart size and pulmonary vascularity are normal. No adenopathy. No bone lesions. There is degenerative change in the lower thoracic region. IMPRESSION: Small area of infiltrate, presumably pneumonia, right lower lobe. Stable atelectasis left base. Lungs elsewhere clear. No change in cardiac  silhouette. Electronically Signed   By: Lowella Grip III M.D.   On: 05/19/2015 14:36    Scheduled Meds: . aspirin  81 mg Oral Daily  . enoxaparin (LOVENOX) injection  40 mg Subcutaneous Q24H  . glimepiride  4 mg Oral QAC breakfast  . insulin aspart  0-9 Units Subcutaneous TID WC  . piperacillin-tazobactam (ZOSYN)  IV  3.375 g Intravenous 3 times per day  . tamsulosin  0.4 mg Oral BID  . vancomycin  1,000 mg Intravenous Once  . [START ON 05/21/2015] vancomycin  1,000 mg Intravenous Q12H   Continuous Infusions: . sodium chloride 100 mL/hr at 05/20/15 1139    Principal Problem:   HCAP (healthcare-associated pneumonia) Active Problems:   Diabetes mellitus type II, uncontrolled ([place])   Hyperlipidemia   Obstructive sleep apnea  Essential hypertension   Sepsis ([place])   UTI (urinary tract infection)   Acute kidney injury ([place])    Time spent: 35 minutes.     Niel Hummer A  [facility] Pager [phone]. If 7PM-7AM, please contact night-coverage at [email or web address], password [facility] 05/20/2015, 4:37 PM  LOS: 0 days

## 2015-05-20 NOTE — Progress Notes (Signed)
Pharmacy Antibiotic Follow-up Note  Walter [name] is a 75 y.o. year-old male admitted on 05/19/2015.  The patient is currently on day #2 of piperacillin/tazobactam for sepsis, ? UTI and/or pneumonia.  Upon reviewing physician notes, see mention of patient also being on vancomycin but no orders for Pharmacy to dose were received and no doses ordered.  Discuss with Dr. Tyrell Antonio and will add vancomycin for now. Renal function has improved from admission yesterday. Hoping to perform TURP 1/21.  Assessment/Plan:  Vancomycin 1gm IV q12h  Follow renal function  Check trough if continues > 3-4 on vancomycin  D/c if cultures remain unrevealing  Zosyn remains dose appropriately  Temp (24hrs), Avg:98.7 F (37.1 C), Min:98.2 F (36.8 C), Max:99.4 F (37.4 C)   Recent Labs Lab 05/18/15 0825 05/19/15 1000 05/19/15 1110 05/20/15 0543  WBC 10.8* 9.1 8.2 5.6    Recent Labs Lab 05/18/15 0825 05/19/15 1000 05/19/15 1110 05/20/15 0543  CREATININE 1.61* 1.72* 1.68* 1.07   Estimated Creatinine Clearance: 66.5 mL/min (by C-G formula based on Cr of 1.07).    No Known Allergies  Antimicrobials this admission:  1/17 >> pip/tazo >>  1/18 >> vanco >>  Levels/dose changes this admission:   Microbiology results:  1/17 Blood culture: NGTD  1/17 Urine culture: pending (+ pyuria)  Thank you for allowing pharmacy to be a part of this patient's care.  Doreene Eland, PharmD, BCPS.   Pager: [phone] 05/20/2015 3:14 PM

## 2015-05-20 NOTE — Plan of Care (Signed)
Problem: Activity: Goal: Risk for activity intolerance will decrease Ambulatory with steady gait.    Problem: Fluid Volume: Goal: Ability to maintain a balanced intake and output will improve Good appetite. Continue to monitor.

## 2015-05-20 NOTE — Progress Notes (Signed)
Inpatient Diabetes Program Recommendations  AACE/ADA: New Consensus Statement on Inpatient Glycemic Control (2015)  Target Ranges:  Prepandial:   less than 140 mg/dL      Peak postprandial:   less than 180 mg/dL (1-2 hours)      Critically ill patients:  140 - 180 mg/dL   Review of Glycemic Control  Inpatient Diabetes Program Recommendations:  Correction (SSI): increase Novolog to moderate scale  Thank you  Raoul Pitch BSN, RN,CDE Inpatient Diabetes Coordinator Glycemic Management Team [phone] (team pager 8a-5p)

## 2015-05-21 ENCOUNTER — Other Ambulatory Visit: Payer: Self-pay | Admitting: Urology

## 2015-05-21 DIAGNOSIS — N179 Acute kidney failure, unspecified: Secondary | ICD-10-CM

## 2015-05-21 DIAGNOSIS — J189 Pneumonia, unspecified organism: Secondary | ICD-10-CM

## 2015-05-21 LAB — BASIC METABOLIC PANEL
Anion gap: 10 (ref 5–15)
BUN: 15 mg/dL (ref 6–20)
CALCIUM: 8.8 mg/dL — AB (ref 8.9–10.3)
CHLORIDE: 103 mmol/L (ref 101–111)
CO2: 23 mmol/L (ref 22–32)
CREATININE: 0.95 mg/dL (ref 0.61–1.24)
GFR calc non Af Amer: >60 mL/min (ref >60)
Glucose, Bld: 229 mg/dL — ABNORMAL HIGH (ref 65–99)
Potassium: 4.1 mmol/L (ref 3.5–5.1)
SODIUM: 136 mmol/L (ref 135–145)

## 2015-05-21 LAB — CBC
HCT: 33.4 % — ABNORMAL LOW (ref 39.0–52.0)
Hemoglobin: 11.4 g/dL — ABNORMAL LOW (ref 13.0–17.0)
MCH: 30.2 pg (ref 26.0–34.0)
MCHC: 34.1 g/dL (ref 30.0–36.0)
MCV: 88.6 fL (ref 78.0–100.0)
PLATELETS: 168 10*3/uL (ref 150–400)
RBC: 3.77 MIL/uL — ABNORMAL LOW (ref 4.22–5.81)
RDW: 14.1 % (ref 11.5–15.5)
WBC: 5.1 10*3/uL (ref 4.0–10.5)

## 2015-05-21 LAB — C DIFFICILE QUICK SCREEN W PCR REFLEX
C DIFFICILE (CDIFF) INTERP: NEGATIVE
C Diff antigen: NEGATIVE
C Diff toxin: NEGATIVE

## 2015-05-21 LAB — URINE CULTURE: Culture: >=100000

## 2015-05-21 LAB — PROCALCITONIN: PROCALCITONIN: 0.36 ng/mL

## 2015-05-21 LAB — GLUCOSE, CAPILLARY
GLUCOSE-CAPILLARY: 293 mg/dL — AB (ref 65–99)
GLUCOSE-CAPILLARY: 212 mg/dL — AB (ref 65–99)
Glucose-Capillary: 179 mg/dL — ABNORMAL HIGH (ref 65–99)
Glucose-Capillary: 281 mg/dL — ABNORMAL HIGH (ref 65–99)

## 2015-05-21 MED ORDER — INSULIN GLARGINE 100 UNIT/ML ~~LOC~~ SOLN
8.0000 [IU] | Freq: Every day | SUBCUTANEOUS | Status: DC
  Administered 2015-05-21 – 2015-05-24 (×4): 8 [IU] via SUBCUTANEOUS
  Filled 2015-05-21 (×4): qty 0.08

## 2015-05-21 MED ORDER — FLUCONAZOLE 200 MG PO TABS
200.0000 mg | ORAL_TABLET | Freq: Every day | ORAL | Status: DC
  Administered 2015-05-21 – 2015-05-25 (×5): 200 mg via ORAL
  Filled 2015-05-21 (×5): qty 1

## 2015-05-21 NOTE — Progress Notes (Signed)
Patient 's foley with very little urine outpu and patient C/O pressure sensation around the cath, bladder scan for >900, notified Dr. Carmelina Noun, order to replace foley, unable to insert regular tip foley, order given for coude cath, output 1100 clear yellow urine. Will continue to assess patient.

## 2015-05-21 NOTE — Progress Notes (Signed)
Patient ID: Walter [name], male   DOB: [birth year], 75 y.o.   MRN: [id number] 2 Days Post-Op  Subjective: Walter [name] is continuing to improve and has no complaints today.  He remains afebrile and his Cr has normalized. ROS:  Review of Systems  All other systems reviewed and are negative.   Anti-infectives: Anti-infectives    Start     Dose/Rate Route Frequency Ordered Stop   05/21/15 1000  fluconazole (DIFLUCAN) tablet 100 mg     100 mg Oral Daily 05/21/15 0804     05/21/15 0600  vancomycin (VANCOCIN) IVPB 1000 mg/200 mL premix     1,000 mg 200 mL/hr over 60 Minutes Intravenous Every 12 hours 05/20/15 1520     05/20/15 1630  vancomycin (VANCOCIN) IVPB 1000 mg/200 mL premix     1,000 mg 200 mL/hr over 60 Minutes Intravenous  Once 05/20/15 1517 05/20/15 1714   05/19/15 1500  piperacillin-tazobactam (ZOSYN) IVPB 3.375 g     3.375 g 12.5 mL/hr over 240 Minutes Intravenous 3 times per day 05/19/15 1354     05/19/15 0933  ceFAZolin (ANCEF) IVPB 2 g/50 mL premix  Status:  Discontinued     2 g 100 mL/hr over 30 Minutes Intravenous 30 min pre-op 05/19/15 0933 05/19/15 1312      Current Facility-Administered Medications  Medication Dose Route Frequency Provider Last Rate Last Dose  . 0.9 %  sodium chloride infusion   Intravenous Continuous Irine Seal, MD 100 mL/hr at 05/20/15 2354    . acetaminophen (TYLENOL) tablet 650 mg  650 mg Oral Q4H PRN Irine Seal, MD      . aspirin chewable tablet 81 mg  81 mg Oral Daily Reyne Dumas, MD   81 mg at 05/20/15 1050  . bisacodyl (DULCOLAX) suppository 10 mg  10 mg Rectal Daily PRN Irine Seal, MD      . enoxaparin (LOVENOX) injection 40 mg  40 mg Subcutaneous Q24H Irine Seal, MD   40 mg at 05/20/15 2000  . fluconazole (DIFLUCAN) tablet 100 mg  100 mg Oral Daily Irine Seal, MD      . glimepiride (AMARYL) tablet 4 mg  4 mg Oral QAC breakfast Reyne Dumas, MD   4 mg at 05/21/15 0755  . HYDROcodone-acetaminophen (NORCO/VICODIN) 5-325 MG per tablet 1-2 tablet  1-2  tablet Oral Q4H PRN Irine Seal, MD      . hydrocortisone (ANUSOL-HC) 2.5 % rectal cream 1 application  1 application Rectal [id number] PRN Reyne Dumas, MD      . HYDROmorphone (DILAUDID) injection 0.5-1 mg  0.5-1 mg Intravenous Q2H PRN Irine Seal, MD      . insulin aspart (novoLOG) injection 0-9 Units  0-9 Units Subcutaneous TID WC Reyne Dumas, MD   5 Units at 05/21/15 0755  . magnesium hydroxide (MILK OF MAGNESIA) suspension 30 mL  30 mL Oral Daily PRN Irine Seal, MD      . ondansetron [facility]) injection 4 mg  4 mg Intravenous Q4H PRN Irine Seal, MD      . piperacillin-tazobactam (ZOSYN) IVPB 3.375 g  3.375 g Intravenous 3 times per day Berton Mount, RPH   3.375 g at 05/21/15 0609  . polyethylene glycol (MIRALAX / GLYCOLAX) packet 17 g  17 g Oral Daily PRN Reyne Dumas, MD   17 g at 05/20/15 2052  . tamsulosin (FLOMAX) capsule 0.4 mg  0.4 mg Oral BID Reyne Dumas, MD   0.4 mg at 05/20/15 2052  . vancomycin (VANCOCIN) IVPB 1000 mg/200 mL premix  1,000 mg Intravenous Q12H Berton Mount, RPH   1,000 mg at 05/21/15 [id number]     Objective: Vital signs in last 24 hours: Temp:  [98.2 F (36.8 C)-98.7 F (37.1 C)] 98.4 F (36.9 C) (01/19 0609) Pulse Rate:  [84-91] 91 (01/19 0609) Resp:  [17-18] 18 (01/19 0609) BP: (98-128)/(51-63) 98/63 mmHg (01/19 0609) SpO2:  [94 %-96 %] 94 % (01/19 0609)  Intake/Output from previous day: 01/18 0701 - 01/19 0700 In: 3770 [P.O.:720; I.V.:2500; IV Piggyback:550] Out: 1890 [Urine:1890] Intake/Output this shift:     Physical Exam  Constitutional: He is well-developed, well-nourished, and in no distress.  Vitals reviewed.   Lab Results:   Recent Labs  05/20/15 0543 05/21/15 0535  WBC 5.6 5.1  HGB 10.9* 11.4*  HCT 32.4* 33.4*  PLT 144* 168   BMET  Recent Labs  05/20/15 0543 05/21/15 0535  NA 132* 136  K 4.3 4.1  CL 96* 103  CO2 25 23  GLUCOSE 186* 229*  BUN 20 15  CREATININE 1.07 0.95  CALCIUM 8.2* 8.8*   PT/INR No results for  input(s): LABPROT, INR in the last 72 hours. ABG No results for input(s): PHART, HCO3 in the last 72 hours.  Invalid input(s): PCO2, PO2  Studies/Results: Dg Chest Port 1 View  05/19/2015  CLINICAL DATA:  Hypertension.  Colon carcinoma.  Sepsis. EXAM: PORTABLE CHEST 1 VIEW COMPARISON:  March 29, 2015 FINDINGS: There is a small area of infiltrate in the right lower lobe. There is stable atelectasis in the left base. The lungs elsewhere are clear. Heart size and pulmonary vascularity are normal. No adenopathy. No bone lesions. There is degenerative change in the lower thoracic region. IMPRESSION: Small area of infiltrate, presumably pneumonia, right lower lobe. Stable atelectasis left base. Lungs elsewhere clear. No change in cardiac silhouette. Electronically Signed   By: Lowella Grip III M.D.   On: 05/19/2015 14:36   Urine culture has grown >100000 Yeast.  Blood cultures are pending.   Assessment and Plan: Walter [name] is continuing to improve on current therapy.    He has candiduria and I will start diflucan 100mg  daily. I have him scheduled for TURP at 53 on Saturday and feel it is best that if he remain in house until then.         LOS: 1 day    Montez Cuda J 05/21/2015 [id number]

## 2015-05-21 NOTE — Progress Notes (Signed)
[facility] PROGRESS NOTE  Walter [name] [id number] DOB: Oct 21, 1940 DOA: 05/19/2015 PCP: Walter Reddish, MD  Assessment/Plan: 75 year old male with a past medical history significant for HTN, DM type II, HLD, colon cancer s/p resection in 1992, OSA on CPAP; who was admitted directly from PACU by Dr. Jeffie [name], hypertension, worsening renal function, confusion, weakness, uncontrolled CBG. Patient has had difficulty with urination/retention requiring replacement of Foley catheter since November 2016, was admitted 11/27- 12/1 for urinary tract infection after he failed outpatient ciprofloxacin. He was admitted with sepsis physiology. He was transitioned to Ceftin and discharged home with indwelling Foley and advised to follow-up with urology. Patient was also started on Flomax , was scheduled for TURP, 1-17, patient was also prescribed one week of Septra and ciprofloxacin perioperatively to cover him for recurrent UTIs. Upon arrival to PACU, he was found to have a CBG of 300, creatinine 1.69, procedure was canceled and the patient was admitted for possible sepsis.lactic acid was found to be 1.9.  HCAP;  Presents with hypotension, renal failure, confusion. Chest x ray with small area of infiltrates.  He was in the hospital 3 weeks ago. Will continue with treatment for Health care associated PNA.  Would repeat chest xr ay on Friday prior to any surgery intervention.   SIRS; Secondary to UTI vs PNA. Presents with Hypotension, tachycardia, AKI.   UTI; follow urine culture. Urology following. On zosyn.  Recurrent UTI, TURP  for BPH with retention , plan on procedure  Urine growing yeast. Started fluconazole.   Diabetes mellitus type II, uncontrolled ([name]) last hemoglobin A1c 8.9, started on sliding scale insulin Continue Amaryl.  hold metformin Start lantus. Check HbA1c.   Acute kidney injury-baseline 0.98, peak to 1.6. Continue with fluids. Has decrease to 1.07  Hyperlipidemia-continue  statin   Obstructive sleep apnea on CPAP  Essential hypertension- Hold Norvasc and Avapro , all antihypertensive medications    Code Status: Full code.  Family Communication: care discussed with patient  Disposition Plan: remain inpatient for treatment of PNA.    Consultants:  Dr Walter [name], primary   Procedures:  none  Antibiotics:  Vancomycin 1-18  Zosyn 1-17  HPI/Subjective: Feeling ok, other than the episode this am of foley catheter obstruction.   Objective: Filed Vitals:   05/21/15 0609 05/21/15 1335  BP: 98/63 122/65  Pulse: 91 78  Temp: 98.4 F (36.9 C) 97.8 F (36.6 C)  Resp: 18 18    Intake/Output Summary (Last 24 hours) at 05/21/15 1520 Last data filed at 05/21/15 0700  Gross per 24 hour  Intake   3770 ml  Output   1515 ml  Net   2255 ml   Filed Weights   05/19/15 1006  Weight: 90.89 kg (200 lb 6 oz)    Exam:   General:  NAD  Cardiovascular: S 1, S 2 RRR  Respiratory: cta  Abdomen: bs present, soft, nt  Musculoskeletal: no edema  Data Reviewed: Basic Metabolic Panel:  Recent Labs Lab 05/18/15 0825 05/19/15 1000 05/19/15 1110 05/20/15 0543 05/21/15 0535  NA 134* 130*  --  132* 136  K 4.9 4.4  --  4.3 4.1  CL 96* 94*  --  96* 103  CO2 22 24  --  25 23  GLUCOSE 309* 315*  --  186* 229*  BUN 28* 28*  --  20 15  CREATININE 1.61* 1.72* 1.68* 1.07 0.95  CALCIUM 9.6 9.2  --  8.2* 8.8*   Liver Function Tests:  Recent Labs Lab 05/19/15 1000  05/20/15 0543  AST 21 20  ALT 26 25  ALKPHOS 51 45  BILITOT 0.9 1.0  PROT 7.5 6.1*  ALBUMIN 3.8 3.0*   No results for input(s): LIPASE, AMYLASE in the last 168 hours. No results for input(s): AMMONIA in the last 168 hours. CBC:  Recent Labs Lab 05/18/15 0825 05/19/15 1000 05/19/15 1110 05/20/15 0543 05/21/15 0535  WBC 10.8* 9.1 8.2 5.6 5.1  HGB 13.2 12.6* 12.4* 10.9* 11.4*  HCT 39.7 36.9* 35.9* 32.4* 33.4*  MCV 92.1 89.8 88.2 90.3 88.6  PLT 195 168 151 144* 168   Cardiac  Enzymes: No results for input(s): CKTOTAL, CKMB, CKMBINDEX, TROPONINI in the last 168 hours. BNP (last 3 results) No results for input(s): BNP in the last 8760 hours.  ProBNP (last 3 results) No results for input(s): PROBNP in the last 8760 hours.  CBG:  Recent Labs Lab 05/20/15 1216 05/20/15 1658 05/20/15 2044 05/21/15 0744 05/21/15 1132  GLUCAP 202* 186* 252* 293* 212*    Recent Results (from the past 240 hour(s))  Culture, blood (routine x 2)     Status: None (Preliminary result)   Collection Time: 05/19/15 11:10 AM  Result Value Ref Range Status   Specimen Description BLOOD RIGHT ARM  Final   Special Requests BOTTLES DRAWN AEROBIC AND ANAEROBIC 5CC  Final   Culture   Final    NO GROWTH 2 DAYS Performed at [facility]    Report Status PENDING  Incomplete  Culture, blood (routine x 2)     Status: None (Preliminary result)   Collection Time: 05/19/15 11:15 AM  Result Value Ref Range Status   Specimen Description BLOOD LEFT ARM  Final   Special Requests BOTTLES DRAWN AEROBIC AND ANAEROBIC 5CC  Final   Culture   Final    NO GROWTH 2 DAYS Performed at [facility]    Report Status PENDING  Incomplete  Urine culture     Status: None   Collection Time: 05/19/15 11:43 AM  Result Value Ref Range Status   Specimen Description URINE, CATHETERIZED  Final   Special Requests NONE  Final   Culture   Final    >=100,000 COLONIES/mL YEAST Performed at [facility]    Report Status 05/21/2015 FINAL  Final     Studies: No results found.  Scheduled Meds: . aspirin  81 mg Oral Daily  . enoxaparin (LOVENOX) injection  40 mg Subcutaneous Q24H  . fluconazole  200 mg Oral Daily  . glimepiride  4 mg Oral QAC breakfast  . insulin aspart  0-9 Units Subcutaneous TID WC  . insulin glargine  8 Units Subcutaneous QHS  . piperacillin-tazobactam (ZOSYN)  IV  3.375 g Intravenous 3 times per day  . tamsulosin  0.4 mg Oral BID  . vancomycin  1,000 mg Intravenous  Q12H   Continuous Infusions: . sodium chloride 100 mL/hr at 05/21/15 1122    Principal Problem:   HCAP (healthcare-associated pneumonia) Active Problems:   Diabetes mellitus type II, uncontrolled ([name])   Hyperlipidemia   Obstructive sleep apnea   Essential hypertension   Sepsis ([place])   UTI (urinary tract infection)   Acute kidney injury ([place])    Time spent: 35 minutes.     Niel Hummer A  [facility] Pager [phone]. If 7PM-7AM, please contact night-coverage at [email or web address], password [facility] 05/21/2015, 3:20 PM  LOS: 1 day

## 2015-05-21 NOTE — Progress Notes (Signed)
Inpatient Diabetes Program Recommendations  AACE/ADA: New Consensus Statement on Inpatient Glycemic Control (2015)  Target Ranges:  Prepandial:   less than 140 mg/dL      Peak postprandial:   less than 180 mg/dL (1-2 hours)      Critically ill patients:  140 - 180 mg/dL   Results for [name], [name] (MRN [id number]) as of 05/21/2015 09:49  Ref. Range 05/20/2015 07:28 05/20/2015 12:16 05/20/2015 16:58 05/20/2015 20:44  Glucose-Capillary Latest Ref Range: 65-99 mg/dL 208 (H) 202 (H) 186 (H) 252 (H)    Results for [name], [name] (MRN [id number]) as of 05/21/2015 09:49  Ref. Range 05/21/2015 07:44  Glucose-Capillary Latest Ref Range: 65-99 mg/dL 293 (H)    Admit Sepsis.   History: DM, HTN  Home DM Meds: Amaryl 4 mg daily        Metformin 1000 mg bid  Current Insulin Orders: Amaryl 4 mg daily      Novolog Sensitive SSI (0-9 units) TID AC      MD- Please consider the following in-hospital insulin adjustments- Patient having consistently elevated glucose levels:  1. Increase Novolog SSI to Moderate scale (0-15 units) TID AC + HS  2. Start low dose basal insulin- Levemir 9 units daily (0.1 units/kg dosing)      --Will follow patient during hospitalization--  Wyn Quaker RN, MSN, CDE Diabetes Coordinator Inpatient Glycemic Control Team Team Pager: [phone] (8a-5p)

## 2015-05-22 ENCOUNTER — Other Ambulatory Visit: Payer: Self-pay | Admitting: Urology

## 2015-05-22 ENCOUNTER — Inpatient Hospital Stay (HOSPITAL_COMMUNITY): Payer: Medicare Other

## 2015-05-22 DIAGNOSIS — E1100 Type 2 diabetes mellitus with hyperosmolarity without nonketotic hyperglycemic-hyperosmolar coma (NKHHC): Secondary | ICD-10-CM

## 2015-05-22 LAB — GLUCOSE, CAPILLARY
GLUCOSE-CAPILLARY: 148 mg/dL — AB (ref 65–99)
Glucose-Capillary: 220 mg/dL — ABNORMAL HIGH (ref 65–99)
Glucose-Capillary: 206 mg/dL — ABNORMAL HIGH (ref 65–99)
Glucose-Capillary: 191 mg/dL — ABNORMAL HIGH (ref 65–99)

## 2015-05-22 LAB — SURGICAL PCR SCREEN
MRSA, PCR: NEGATIVE
Staphylococcus aureus: NEGATIVE

## 2015-05-22 MED ORDER — POLYETHYLENE GLYCOL 3350 17 G PO PACK
17.0000 g | PACK | Freq: Every day | ORAL | Status: DC
  Administered 2015-05-22 – 2015-05-24 (×3): 17 g via ORAL
  Filled 2015-05-22 (×6): qty 1

## 2015-05-22 NOTE — Progress Notes (Signed)
Pharmacy Antibiotic Follow-up Note  Walter [name] is a 75 y.o. year-old male admitted on 05/19/2015.  The patient is currently on day #4 of piperacillin/tazobactam, day #3 vancomcyin, day #2 fluconazole for sepsis, UTI and/or pneumonia.  Plan for TURP Saturday thus treating yeast in urine culture.   Assessment/Plan:  Suggest stopping vancomycin as MRSA PCR is negative, cultures unrevealing.  If vancomycin to continue will need to check trough   Zosyn remains dose appropriately - consider narrowing  Temp (24hrs), Avg:98.1 F (36.7 C), Min:97.7 F (36.5 C), Max:98.7 F (37.1 C)   Recent Labs Lab 05/18/15 0825 05/19/15 1000 05/19/15 1110 05/20/15 0543 05/21/15 0535  WBC 10.8* 9.1 8.2 5.6 5.1     Recent Labs Lab 05/18/15 0825 05/19/15 1000 05/19/15 1110 05/20/15 0543 05/21/15 0535  CREATININE 1.61* 1.72* 1.68* 1.07 0.95   Estimated Creatinine Clearance: 74.9 mL/min (by C-G formula based on Cr of 0.95).    No Known Allergies  Antimicrobials this admission: 1/17 >> pip/tazo   >>  1/18 >> vanco >> 1/19 >> fluconazole (MD)  Levels/dose changes this admission:  Microbiology results: 1/17 Blood culture:  NGTD 1/17 Urine culture: >100K yeast (+ pyuria) 1/20 MRSA pCR: neg 1/19 C difficile: neg/neg  Thank you for allowing pharmacy to be a part of this patient's care.  Doreene Eland, PharmD, BCPS.   Pager: [phone] 05/22/2015 11:11 AM

## 2015-05-22 NOTE — Progress Notes (Addendum)
[facility] PROGRESS NOTE  Clabon Luria [id number] DOB: 08-Nov-1940 DOA: 05/19/2015 PCP: [name], MD  Assessment/Plan: 75 year old male with a past medical history significant for HTN, DM type II, HLD, colon cancer s/p resection in 1992, OSA on CPAP; who was admitted directly from PACU by Dr. Jeffie Pollock, hypertension, worsening renal function, confusion, weakness, uncontrolled CBG. Patient has had difficulty with urination/retention requiring replacement of Foley catheter since November 2016, was admitted 11/27- 12/1 for urinary tract infection after he failed outpatient ciprofloxacin. He was admitted with sepsis physiology. He was transitioned to Ceftin and discharged home with indwelling Foley and advised to follow-up with urology. Patient was also started on Flomax , was scheduled for TURP, 1-17, patient was also prescribed one week of Septra and ciprofloxacin perioperatively to cover him for recurrent UTIs. Upon arrival to PACU, he was found to have a CBG of 300, creatinine 1.69, procedure was canceled and the patient was admitted for possible sepsis.lactic acid was found to be 1.9.  HCAP;  Presents with hypotension, renal failure, confusion. Chest x ray with small area of infiltrates.  He was in the hospital 3 weeks ago. Will continue with treatment for Health care associated PNA.  Chest x ray stable, improved.  Day 4. Will discontinue vancomycin today, MRSA PCR negative  SIRS; Secondary to UTI vs PNA. Presents with Hypotension, tachycardia, AKI.   UTI; follow urine culture. Urology following. On zosyn.  Recurrent UTI, TURP  for BPH with retention , plan on procedure  Urine growing yeast. Started fluconazole.   Diabetes mellitus type II, uncontrolled ([place]) last hemoglobin A1c 8.9, started on sliding scale insulin Continue Amaryl.  hold metformin Continue with  lantus.  Acute kidney injury-baseline 0.98, peak to 1.6. Continue with fluids. Has decrease to  1.07  Hyperlipidemia-continue statin   Obstructive sleep apnea on CPAP  Essential hypertension- Hold Norvasc and Avapro , all antihypertensive medications    Code Status: Full code.  Family Communication: care discussed with patient  Disposition Plan: remain inpatient for treatment of PNA.    Consultants:  Dr Roni Bread, primary   Procedures:  none  Antibiotics:  Vancomycin 1-18  Zosyn 1-17  HPI/Subjective: Feeling well, breathing better.  He is able to walk 4 miles every day  Objective: Filed Vitals:   05/22/15 0620 05/22/15 0845  BP: 116/66 111/59  Pulse: 77 90  Temp: 98.7 F (37.1 C) 97.7 F (36.5 C)  Resp: 18 18    Intake/Output Summary (Last 24 hours) at 05/22/15 1342 Last data filed at 05/22/15 0800  Gross per 24 hour  Intake   2900 ml  Output   2350 ml  Net    550 ml   Filed Weights   05/19/15 1006  Weight: 90.89 kg (200 lb 6 oz)    Exam:   General:  NAD  Cardiovascular: S 1, S 2 RRR  Respiratory: cta  Abdomen: bs present, soft, nt  Musculoskeletal: no edema  Data Reviewed: Basic Metabolic Panel:  Recent Labs Lab 05/18/15 0825 05/19/15 1000 05/19/15 1110 05/20/15 0543 05/21/15 0535  NA 134* 130*  --  132* 136  K 4.9 4.4  --  4.3 4.1  CL 96* 94*  --  96* 103  CO2 22 24  --  25 23  GLUCOSE 309* 315*  --  186* 229*  BUN 28* 28*  --  20 15  CREATININE 1.61* 1.72* 1.68* 1.07 0.95  CALCIUM 9.6 9.2  --  8.2* 8.8*   Liver Function Tests:  Recent Labs  Lab 05/19/15 1000 05/20/15 0543  AST 21 20  ALT 26 25  ALKPHOS 51 45  BILITOT 0.9 1.0  PROT 7.5 6.1*  ALBUMIN 3.8 3.0*   No results for input(s): LIPASE, AMYLASE in the last 168 hours. No results for input(s): AMMONIA in the last 168 hours. CBC:  Recent Labs Lab 05/18/15 0825 05/19/15 1000 05/19/15 1110 05/20/15 0543 05/21/15 0535  WBC 10.8* 9.1 8.2 5.6 5.1  HGB 13.2 12.6* 12.4* 10.9* 11.4*  HCT 39.7 36.9* 35.9* 32.4* 33.4*  MCV 92.1 89.8 88.2 90.3 88.6  PLT 195  168 151 144* 168   Cardiac Enzymes: No results for input(s): CKTOTAL, CKMB, CKMBINDEX, TROPONINI in the last 168 hours. BNP (last 3 results) No results for input(s): BNP in the last 8760 hours.  ProBNP (last 3 results) No results for input(s): PROBNP in the last 8760 hours.  CBG:  Recent Labs Lab 05/21/15 1132 05/21/15 1639 05/21/15 2024 05/22/15 0724 05/22/15 1141  GLUCAP 212* 179* 281* 220* 206*    Recent Results (from the past 240 hour(s))  Culture, blood (routine x 2)     Status: None (Preliminary result)   Collection Time: 05/19/15 11:10 AM  Result Value Ref Range Status   Specimen Description BLOOD RIGHT ARM  Final   Special Requests BOTTLES DRAWN AEROBIC AND ANAEROBIC 5CC  Final   Culture   Final    NO GROWTH 2 DAYS Performed at [facility]    Report Status PENDING  Incomplete  Culture, blood (routine x 2)     Status: None (Preliminary result)   Collection Time: 05/19/15 11:15 AM  Result Value Ref Range Status   Specimen Description BLOOD LEFT ARM  Final   Special Requests BOTTLES DRAWN AEROBIC AND ANAEROBIC 5CC  Final   Culture   Final    NO GROWTH 2 DAYS Performed at [facility]    Report Status PENDING  Incomplete  Urine culture     Status: None   Collection Time: 05/19/15 11:43 AM  Result Value Ref Range Status   Specimen Description URINE, CATHETERIZED  Final   Special Requests NONE  Final   Culture   Final    >=100,000 COLONIES/mL YEAST Performed at [facility]    Report Status 05/21/2015 FINAL  Final  C difficile quick scan w PCR reflex     Status: None   Collection Time: 05/21/15  4:16 PM  Result Value Ref Range Status   C Diff antigen NEGATIVE NEGATIVE Final   C Diff toxin NEGATIVE NEGATIVE Final   C Diff interpretation Negative for toxigenic C. difficile  Final  Surgical pcr screen     Status: None   Collection Time: 05/22/15  6:38 AM  Result Value Ref Range Status   MRSA, PCR NEGATIVE NEGATIVE Final    Staphylococcus aureus NEGATIVE NEGATIVE Final    Comment:        The Xpert SA Assay (FDA approved for NASAL specimens in patients over 43 years of age), is one component of a comprehensive surveillance program.  Test performance has been validated by [facility] for patients greater than or equal to 48 year old. It is not intended to diagnose infection nor to guide or monitor treatment.      Studies: Dg Chest 2 View  05/22/2015  CLINICAL DATA:  Pneumonia EXAM: CHEST  2 VIEW COMPARISON:  05/19/2015 FINDINGS: Linear density right lung base slightly improved. This may represent atelectasis or pneumonia Left lower lobe scarring unchanged. Negative  for heart failure or edema.  No pleural effusion. IMPRESSION: Linear density right lung base slightly improved. This may represent atelectasis or pneumonia Left lower lobe scarring unchanged. Electronically Signed   By: Franchot Gallo M.D.   On: 05/22/2015 08:44    Scheduled Meds: . aspirin  81 mg Oral Daily  . fluconazole  200 mg Oral Daily  . glimepiride  4 mg Oral QAC breakfast  . insulin aspart  0-9 Units Subcutaneous TID WC  . insulin glargine  8 Units Subcutaneous QHS  . piperacillin-tazobactam (ZOSYN)  IV  3.375 g Intravenous 3 times per day  . tamsulosin  0.4 mg Oral BID  . vancomycin  1,000 mg Intravenous Q12H   Continuous Infusions: . sodium chloride 100 mL/hr at 05/22/15 1156    Principal Problem:   HCAP (healthcare-associated pneumonia) Active Problems:   Diabetes mellitus type II, uncontrolled ([place])   Hyperlipidemia   Obstructive sleep apnea   Essential hypertension   Sepsis ([place])   UTI (urinary tract infection)   Acute kidney injury ([place])    Time spent: 35 minutes.     Niel Hummer A  [facility] Pager [phone]. If 7PM-7AM, please contact night-coverage at [email or web address], password [facility] 05/22/2015, 1:42 PM  LOS: 2 days

## 2015-05-22 NOTE — Progress Notes (Signed)
3 Days Post-Op Subjective: Patient reports no pain or LUTS with foley in place. The foley was replaced yesterday due to obstruction, now draining well. afebrile  Objective: Vital signs in last 24 hours: Temp:  [97.7 F (36.5 C)-98.7 F (37.1 C)] 98 F (36.7 C) (01/20 1405) Pulse Rate:  [77-90] 80 (01/20 1405) Resp:  [18] 18 (01/20 1405) BP: (98-116)/(59-80) 98/80 mmHg (01/20 1405) SpO2:  [95 %-97 %] 97 % (01/20 1405)  Intake/Output from previous day: 01/19 0701 - 01/20 0700 In: 2950 [I.V.:2400; IV Piggyback:550] Out: 3000 [Urine:3000] Intake/Output this shift:    Physical Exam:  General:alert, cooperative and appears stated age GI: soft, non tender, normal bowel sounds, no palpable masses, no organomegaly, no inguinal hernia Male genitalia: not done Extremities: extremities normal, atraumatic, no cyanosis or edema  Lab Results:  Recent Labs  05/20/15 0543 05/21/15 0535  HGB 10.9* 11.4*  HCT 32.4* 33.4*   BMET  Recent Labs  05/20/15 0543 05/21/15 0535  NA 132* 136  K 4.3 4.1  CL 96* 103  CO2 25 23  GLUCOSE 186* 229*  BUN 20 15  CREATININE 1.07 0.95  CALCIUM 8.2* 8.8*   No results for input(s): LABPT, INR in the last 72 hours. No results for input(s): LABURIN in the last 72 hours. Results for orders placed or performed during the hospital encounter of 05/19/15  Culture, blood (routine x 2)     Status: None (Preliminary result)   Collection Time: 05/19/15 11:10 AM  Result Value Ref Range Status   Specimen Description BLOOD RIGHT ARM  Final   Special Requests BOTTLES DRAWN AEROBIC AND ANAEROBIC 5CC  Final   Culture   Final    NO GROWTH 3 DAYS Performed at [facility]    Report Status PENDING  Incomplete  Culture, blood (routine x 2)     Status: None (Preliminary result)   Collection Time: 05/19/15 11:15 AM  Result Value Ref Range Status   Specimen Description BLOOD LEFT ARM  Final   Special Requests BOTTLES DRAWN AEROBIC AND ANAEROBIC 5CC   Final   Culture   Final    NO GROWTH 3 DAYS Performed at [facility]    Report Status PENDING  Incomplete  Urine culture     Status: None   Collection Time: 05/19/15 11:43 AM  Result Value Ref Range Status   Specimen Description URINE, CATHETERIZED  Final   Special Requests NONE  Final   Culture   Final    >=100,000 COLONIES/mL YEAST Performed at [facility]    Report Status 05/21/2015 FINAL  Final  C difficile quick scan w PCR reflex     Status: None   Collection Time: 05/21/15  4:16 PM  Result Value Ref Range Status   C Diff antigen NEGATIVE NEGATIVE Final   C Diff toxin NEGATIVE NEGATIVE Final   C Diff interpretation Negative for toxigenic C. difficile  Final  Surgical pcr screen     Status: None   Collection Time: 05/22/15  6:38 AM  Result Value Ref Range Status   MRSA, PCR NEGATIVE NEGATIVE Final   Staphylococcus aureus NEGATIVE NEGATIVE Final    Comment:        The Xpert SA Assay (FDA approved for NASAL specimens in patients over 59 years of age), is one component of a comprehensive surveillance program.  Test performance has been validated by [facility] for patients greater than or equal to 36 year old. It is not intended to diagnose  infection nor to guide or monitor treatment.     Studies/Results: Dg Chest 2 View  05/22/2015  CLINICAL DATA:  Pneumonia EXAM: CHEST  2 VIEW COMPARISON:  05/19/2015 FINDINGS: Linear density right lung base slightly improved. This may represent atelectasis or pneumonia Left lower lobe scarring unchanged. Negative for heart failure or edema.  No pleural effusion. IMPRESSION: Linear density right lung base slightly improved. This may represent atelectasis or pneumonia Left lower lobe scarring unchanged. Electronically Signed   By: Franchot Gallo M.D.   On: 05/22/2015 08:44    Assessment/Plan: 75yo with BPH LUTS, urinary retention, funguria  Plan:  1. Continue diflucan 2. NPO after midnight for TURP   LOS: 2 days    Jinger Middlesworth L 05/22/2015, 7:44 PM

## 2015-05-23 ENCOUNTER — Inpatient Hospital Stay (HOSPITAL_COMMUNITY): Payer: Medicare Other | Admitting: Anesthesiology

## 2015-05-23 ENCOUNTER — Encounter (HOSPITAL_COMMUNITY): Payer: Self-pay | Admitting: Anesthesiology

## 2015-05-23 ENCOUNTER — Encounter (HOSPITAL_COMMUNITY)
Admission: RE | Disposition: A | Discharge status: Home / Self Care | Payer: Self-pay | Source: Ambulatory Visit | Attending: Urology

## 2015-05-23 HISTORY — PX: TRANSURETHRAL RESECTION OF PROSTATE: SHX73

## 2015-05-23 LAB — BASIC METABOLIC PANEL
ANION GAP: 8 (ref 5–15)
BUN: 11 mg/dL (ref 6–20)
CALCIUM: 8.7 mg/dL — AB (ref 8.9–10.3)
CO2: 27 mmol/L (ref 22–32)
CREATININE: 0.94 mg/dL (ref 0.61–1.24)
Chloride: 105 mmol/L (ref 101–111)
GFR calc Af Amer: >60 mL/min (ref >60)
GLUCOSE: 194 mg/dL — AB (ref 65–99)
Potassium: 4.5 mmol/L (ref 3.5–5.1)
Sodium: 140 mmol/L (ref 135–145)

## 2015-05-23 LAB — GLUCOSE, CAPILLARY
GLUCOSE-CAPILLARY: 260 mg/dL — AB (ref 65–99)
Glucose-Capillary: 175 mg/dL — ABNORMAL HIGH (ref 65–99)
Glucose-Capillary: 149 mg/dL — ABNORMAL HIGH (ref 65–99)
Glucose-Capillary: 218 mg/dL — ABNORMAL HIGH (ref 65–99)

## 2015-05-23 LAB — CBC
HCT: 31.6 % — ABNORMAL LOW (ref 39.0–52.0)
Hemoglobin: 10.5 g/dL — ABNORMAL LOW (ref 13.0–17.0)
MCH: 29.7 pg (ref 26.0–34.0)
MCHC: 33.2 g/dL (ref 30.0–36.0)
MCV: 89.5 fL (ref 78.0–100.0)
PLATELETS: 189 10*3/uL (ref 150–400)
RBC: 3.53 MIL/uL — ABNORMAL LOW (ref 4.22–5.81)
RDW: 14.2 % (ref 11.5–15.5)
WBC: 6.1 10*3/uL (ref 4.0–10.5)

## 2015-05-23 LAB — PROCALCITONIN: Procalcitonin: 0.14 ng/mL

## 2015-05-23 SURGERY — TURP (TRANSURETHRAL RESECTION OF PROSTATE)
Anesthesia: General | Site: Prostate

## 2015-05-23 MED ORDER — SODIUM CHLORIDE 0.9 % IV SOLN
INTRAVENOUS | Status: DC | PRN
  Administered 2015-05-23: 07:00:00 via INTRAVENOUS

## 2015-05-23 MED ORDER — PROMETHAZINE HCL 25 MG/ML IJ SOLN: 6.2500 mg | INTRAMUSCULAR | Status: DC | PRN

## 2015-05-23 MED ORDER — FENTANYL CITRATE (PF) 100 MCG/2ML IJ SOLN
INTRAMUSCULAR | Status: AC
  Filled 2015-05-23: qty 2

## 2015-05-23 MED ORDER — ONDANSETRON HCL 4 MG/2ML IJ SOLN
INTRAMUSCULAR | Status: DC | PRN
  Administered 2015-05-23: 4 mg via INTRAVENOUS

## 2015-05-23 MED ORDER — ONDANSETRON HCL 4 MG/2ML IJ SOLN
INTRAMUSCULAR | Status: AC
  Filled 2015-05-23: qty 2

## 2015-05-23 MED ORDER — FENTANYL CITRATE (PF) 100 MCG/2ML IJ SOLN: 25.0000 ug | INTRAMUSCULAR | Status: DC | PRN

## 2015-05-23 MED ORDER — HYDROMORPHONE HCL 1 MG/ML IJ SOLN: 0.2500 mg | INTRAMUSCULAR | Status: DC | PRN

## 2015-05-23 MED ORDER — PROPOFOL 10 MG/ML IV BOLUS
INTRAVENOUS | Status: AC
  Filled 2015-05-23: qty 20

## 2015-05-23 MED ORDER — LIDOCAINE HCL (CARDIAC) 20 MG/ML IV SOLN
INTRAVENOUS | Status: AC
  Filled 2015-05-23: qty 5

## 2015-05-23 MED ORDER — SODIUM CHLORIDE 0.9 % IR SOLN
Status: DC | PRN
  Administered 2015-05-23: 3000 mL
  Administered 2015-05-23: 1000 mL
  Administered 2015-05-23 (×5): 3000 mL

## 2015-05-23 MED ORDER — LIDOCAINE HCL (CARDIAC) 20 MG/ML IV SOLN
INTRAVENOUS | Status: DC | PRN
  Administered 2015-05-23: 50 mg via INTRAVENOUS

## 2015-05-23 MED ORDER — 0.9 % SODIUM CHLORIDE (POUR BTL) OPTIME
TOPICAL | Status: DC | PRN
  Administered 2015-05-23: 1000 mL

## 2015-05-23 MED ORDER — SODIUM CHLORIDE 0.9 % IV SOLN: INTRAVENOUS | Status: DC

## 2015-05-23 MED ORDER — FENTANYL CITRATE (PF) 100 MCG/2ML IJ SOLN
INTRAMUSCULAR | Status: DC | PRN
  Administered 2015-05-23 (×8): 25 ug via INTRAVENOUS

## 2015-05-23 MED ORDER — PROPOFOL 10 MG/ML IV BOLUS
INTRAVENOUS | Status: DC | PRN
  Administered 2015-05-23: 150 mg via INTRAVENOUS

## 2015-05-23 SURGICAL SUPPLY — 15 items
BAG URINE DRAINAGE (UROLOGICAL SUPPLIES) ×2 IMPLANT
BAG URO CATCHER STRL LF (MISCELLANEOUS) ×2 IMPLANT
CATH FOLEY 3WAY 30CC 22FR (CATHETERS) ×2 IMPLANT
ELECT REM PT RETURN 9FT ADLT (ELECTROSURGICAL) ×2
ELECTRODE REM PT RTRN 9FT ADLT (ELECTROSURGICAL) ×1 IMPLANT
GLOVE SURG SS PI 8.0 STRL IVOR (GLOVE) ×2 IMPLANT
GOWN STRL REUS W/TWL XL LVL3 (GOWN DISPOSABLE) ×2 IMPLANT
HOLDER FOLEY CATH W/STRAP (MISCELLANEOUS) ×2 IMPLANT
KIT ASPIRATION TUBING (SET/KITS/TRAYS/PACK) ×2 IMPLANT
LOOP CUT BIPOLAR 24F LRG (ELECTROSURGICAL) ×4 IMPLANT
MANIFOLD NEPTUNE II (INSTRUMENTS) ×2 IMPLANT
PACK CYSTO (CUSTOM PROCEDURE TRAY) ×2 IMPLANT
SYR 30ML LL (SYRINGE) ×2 IMPLANT
SYRINGE IRR TOOMEY STRL 70CC (SYRINGE) ×2 IMPLANT
TUBING CONNECTING 10 (TUBING) ×2 IMPLANT

## 2015-05-23 NOTE — Progress Notes (Signed)
Patient ID: Walter [name], male   DOB: 1940-12-02, 75 y.o.   MRN: [id number]  Walter [name] is doing well post op.   HIs urine is clear.  BP 120/63 mmHg  Pulse 70  Temp(Src) 98 F (36.7 C) (Oral)  Resp 16  Ht 6' (1.829 m)  Wt 90.89 kg (200 lb 6 oz)  BMI 27.17 kg/m2  SpO2 95%   Imp:  Doing well post TURP.  Plan:  D/C foley Monday morning for a voiding trial.

## 2015-05-23 NOTE — Anesthesia Procedure Notes (Signed)
Procedure Name: LMA Insertion Date/Time: 05/23/2015 7:26 AM Performed by: Anne Fu Pre-anesthesia Checklist: Patient identified, Emergency Drugs available, Suction available, Patient being monitored and Timeout performed Patient Re-evaluated:Patient Re-evaluated prior to inductionOxygen Delivery Method: Circle system utilized Preoxygenation: Pre-oxygenation with 100% oxygen Intubation Type: IV induction Ventilation: Mask ventilation without difficulty LMA: LMA inserted LMA Size: 4.0 Number of attempts: 1 Placement Confirmation: positive ETCO2 and breath sounds checked- equal and bilateral Tube secured with: Tape

## 2015-05-23 NOTE — Brief Op Note (Signed)
05/19/2015 - 05/23/2015  8:32 AM  PATIENT:  Walter [name]  75 y.o. male  PRE-OPERATIVE DIAGNOSIS:  BPH WITH RETENTION   POST-OPERATIVE DIAGNOSIS:  BPH WITH RETENTION   PROCEDURE:  Procedure(s): TRANSURETHRAL RESECTION OF THE PROSTATE (TURP) (N/A)  SURGEON:  Surgeon(s) and Role:    * Irine Seal, MD - Primary  PHYSICIAN ASSISTANT:   ASSISTANTS: none   ANESTHESIA:   general  EBL:     BLOOD ADMINISTERED:none  DRAINS: Urinary Catheter (Foley)   LOCAL MEDICATIONS USED:  NONE  SPECIMEN:  Source of Specimen:  prostate tissue  DISPOSITION OF SPECIMEN:  PATHOLOGY  COUNTS:  YES  TOURNIQUET:  * No tourniquets in log *  DICTATION: .Other Dictation: Dictation Number [id number]  PLAN OF CARE: Admit to inpatient   PATIENT DISPOSITION:  PACU - hemodynamically stable.   Delay start of Pharmacological VTE agent (>24hrs) due to surgical blood loss or risk of bleeding: yes

## 2015-05-23 NOTE — Progress Notes (Signed)
Patient was already sent down for procedure prior to the start of this nurse's shift.

## 2015-05-23 NOTE — Transfer of Care (Signed)
Immediate Anesthesia Transfer of Care Note  Patient: Walter [name]  Procedure(s) Performed: Procedure(s): TRANSURETHRAL RESECTION OF THE PROSTATE (TURP) (N/A)  Patient Location: PACU  Anesthesia Type:General  Level of Consciousness:  sedated, patient cooperative and responds to stimulation  Airway & Oxygen Therapy:Patient Spontanous Breathing and Patient connected to face mask oxgen  Post-op Assessment:  Report given to PACU RN and Post -op Vital signs reviewed and stable  Post vital signs:  Reviewed and stable  Last Vitals:  Filed Vitals:   05/22/15 2120 05/23/15 0513  BP: 119/78 124/67  Pulse: 74 72  Temp: 36.8 C 36.7 C  Resp: 18 18    Complications: No apparent anesthesia complications

## 2015-05-23 NOTE — Op Note (Signed)
Walter [name], Walter [name] NO.:  [id number]  MEDICAL RECORD NO.:  [id number]  LOCATION:  [facility]                         FACILITY:  [facility]  PHYSICIAN:  Marshall Cork. Jeffie Pollock, M.D.    DATE OF BIRTH:  [birth year]  DATE OF PROCEDURE:  05/23/2015 DATE OF DISCHARGE:                              OPERATIVE REPORT   PATIENT OF:  Marshall Cork. Jeffie Pollock, M.D.  PROCEDURE:  Transurethral resection of the prostate.  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  SURGEON:  Marshall Cork. Jeffie Pollock, M.D.  ANESTHESIA:  General.  SPECIMENS:  Prostate chips.  DRAINS:  A 22-French 3-way Foley catheter.  BLOOD LOSS:  Approximately 50-100 mL.  COMPLICATIONS:  None.  INDICATIONS:  Walter [name] is a 75 year old white male with a history of postoperative urinary retention.  He has had a Foley catheter for approximately 2 months after failing voiding trials on alpha blockers. Urodynamics revealed a hypotonic bladder, but it did have some degree of detrusor contraction with potential outlet obstruction.  Cystoscopy revealed bilobar hyperplasia with some degree of obstruction.  It was felt that a TURP was indicated to try to facilitate voiding.  His original schedule date was Tuesday of this past week, but he presented with sepsis and has been on vancomycin and Zosyn.  The only culture finding was yeast in the urine.  So, we started him on fluconazole.  A chest x-ray revealed a possible right lower lobe infiltrate.  He is currently on the fluconazole and Zosyn which he received this morning.  FINDINGS OF PROCEDURE:  The patient was taken to the operating room where a general anesthetic was induced.  He was placed in a lithotomy position.  PAS hoses were placed.  His perineum and genitalia were prepped with Betadine  solution.  He was draped in usual sterile fashion.  Cystoscopy was performed using the 23-French scope and 30-degree lens. Examination revealed a normal urethra.  The external sphincter was intact.  The prostatic urethra was approximately 2-3 cm in length with bilobar hyperplasia and a high bladder neck.  Examination of the bladder revealed some catheter irritation with mild trabeculation.  No tumors or stones were noted.  The ureteral orifices were unremarkable.  The cystoscope was then replaced with a 28-French continuous flow resectoscope sheath.  This was fitted with an Beatrix Fetters handle with a bipolar loop and 30-degree lens.  Saline was used as the irrigant.  The prostate was then resected beginning at the bladder neck where the bladder neck fibers were exposed from 5 to 7 o'clock.  The floor of the prostate was then resected out to alongside the verumontanum.  The left lobe of the prostate was resected from bladder neck to apex.  This was followed by resection of the right lobe, both resected out to capsular fibers.  On the left in particular, I unroofed several pockets of prostatic calculi which were manipulated into the bladder.  I then resected residual anterior and apical tissue.  An additional pocket of stones was noted in the  anterior prostate toward the apex.  It was unroofed and emptied of stones.  At this point, the bladder was evacuated free of chips and stones, and some additional resection was performed on the left lateral wall with a decompressed bladder showing tissue here.  Once this was done, these chips were evacuated.  Final hemostasis was achieved.  Final inspection revealed no retained chips or stones in the bladder.  The ureteral orifices were intact.  There was no active bleeding, and he had a widely patent TUR defect.  The scope was removed, and pressure on the bladder produced a good stream.  The bladder was then drained with a 22-French 3- way Foley  catheter with the aid of a catheter guide.  The catheter was irrigated with clear return and placed to straight drainage and continuous irrigation with saline.  He was then taken down from a lithotomy position.  His anesthetic was reversed.  He was moved to the recovery room in stable condition.  There were no complications.     Marshall Cork. Jeffie Pollock, M.D.     JJW/MEDQ  D:  05/23/2015  T:  05/23/2015  Job:  [id number]

## 2015-05-23 NOTE — Anesthesia Postprocedure Evaluation (Signed)
Anesthesia Post Note  Patient: Walter [name]  Procedure(s) Performed: Procedure(s) (LRB): TRANSURETHRAL RESECTION OF THE PROSTATE (TURP) (N/A)  Patient location during evaluation: PACU Anesthesia Type: General Level of consciousness: awake and alert Pain management: pain level controlled Vital Signs Assessment: post-procedure vital signs reviewed and stable Respiratory status: spontaneous breathing, nonlabored ventilation, respiratory function stable and patient connected to nasal cannula oxygen Cardiovascular status: blood pressure returned to baseline and stable Postop Assessment: no signs of nausea or vomiting Anesthetic complications: no    Last Vitals:  Filed Vitals:   05/23/15 0915 05/23/15 0930  BP: 126/72 126/68  Pulse: 65 62  Temp: 36.3 C 36.4 C  Resp: 15 14    Last Pain:  Filed Vitals:   05/23/15 0934  PainSc: 0-No pain                 Anakaren Campion J

## 2015-05-23 NOTE — Progress Notes (Signed)
[facility] PROGRESS NOTE  Walter [name] [id number] DOB: 07/13/1940 DOA: 05/19/2015 PCP: [name], MD  Assessment/Plan: 75 year old male with a past medical history significant for HTN, DM type II, HLD, colon cancer s/p resection in 1992, OSA on CPAP; who was admitted directly from PACU by Dr. Jeffie Pollock, hypertension, worsening renal function, confusion, weakness, uncontrolled CBG. Patient has had difficulty with urination/retention requiring replacement of Foley catheter since November 2016, was admitted 11/27- 12/1 for urinary tract infection after he failed outpatient ciprofloxacin. He was admitted with sepsis physiology. He was transitioned to Ceftin and discharged home with indwelling Foley and advised to follow-up with urology. Patient was also started on Flomax , was scheduled for TURP, 1-17, patient was also prescribed one week of Septra and ciprofloxacin perioperatively to cover him for recurrent UTIs. Upon arrival to PACU, he was found to have a CBG of 300, creatinine 1.69, procedure was canceled and the patient was admitted for possible sepsis.lactic acid was found to be 1.9.  HCAP;  Presents with hypotension, renal failure, confusion. Chest x ray with small area of infiltrates.  He was in the hospital 3 weeks ago. Will continue with treatment for Health care associated PNA.  Chest x ray stable, improved.  Day 5. Will discontinue vancomycin today, MRSA PCR negative Will consider transition to augment tomorrow.   SIRS; Secondary to UTI vs PNA. Presents with Hypotension, tachycardia, AKI.   UTI; follow urine culture. Urology following. On zosyn.  Recurrent UTI, TURP  for BPH with retention , plan on procedure  Urine growing yeast. On  fluconazole.   Post TURP;  Per urology   Diabetes mellitus type II, uncontrolled ([place]) last hemoglobin A1c 8.9, started on sliding scale insulin Continue Amaryl.  hold metformin Continue with  lantus.  Acute kidney injury-baseline  0.98, peak to 1.6. Continue with fluids. resolved  Hyperlipidemia-continue statin   Obstructive sleep apnea on CPAP  Essential hypertension- Hold Norvasc and Avapro , all antihypertensive medications    Code Status: Full code.  Family Communication: care discussed with patient  Disposition Plan: discharge home when ok by urology    Consultants:  Dr Roni Bread, primary   Procedures:  none  Antibiotics:  Vancomycin 1-18---1-20  Zosyn 1-17  HPI/Subjective: Doing well post procedure.  No significant pain   Objective: Filed Vitals:   05/23/15 0915 05/23/15 0930  BP: 126/72 126/68  Pulse: 65 62  Temp: 97.4 F (36.3 C) 97.5 F (36.4 C)  Resp: 15 14    Intake/Output Summary (Last 24 hours) at 05/23/15 1214 Last data filed at 05/23/15 1135  Gross per 24 hour  Intake   3845 ml  Output   7975 ml  Net  -4130 ml   Filed Weights   05/19/15 1006  Weight: 90.89 kg (200 lb 6 oz)    Exam:   General:  NAD  Cardiovascular: S 1, S 2 RRR  Respiratory: cta  Abdomen: bs present, soft, nt  Musculoskeletal: no edema  Data Reviewed: Basic Metabolic Panel:  Recent Labs Lab 05/18/15 0825 05/19/15 1000 05/19/15 1110 05/20/15 0543 05/21/15 0535 05/23/15 0550  NA 134* 130*  --  132* 136 140  K 4.9 4.4  --  4.3 4.1 4.5  CL 96* 94*  --  96* 103 105  CO2 22 24  --  25 23 27   GLUCOSE 309* 315*  --  186* 229* 194*  BUN 28* 28*  --  20 15 11   CREATININE 1.61* 1.72* 1.68* 1.07 0.95 0.94  CALCIUM 9.6  9.2  --  8.2* 8.8* 8.7*   Liver Function Tests:  Recent Labs Lab 05/19/15 1000 05/20/15 0543  AST 21 20  ALT 26 25  ALKPHOS 51 45  BILITOT 0.9 1.0  PROT 7.5 6.1*  ALBUMIN 3.8 3.0*   No results for input(s): LIPASE, AMYLASE in the last 168 hours. No results for input(s): AMMONIA in the last 168 hours. CBC:  Recent Labs Lab 05/19/15 1000 05/19/15 1110 05/20/15 0543 05/21/15 0535 05/23/15 0550  WBC 9.1 8.2 5.6 5.1 6.1  HGB 12.6* 12.4* 10.9* 11.4* 10.5*   HCT 36.9* 35.9* 32.4* 33.4* 31.6*  MCV 89.8 88.2 90.3 88.6 89.5  PLT 168 151 144* 168 189   Cardiac Enzymes: No results for input(s): CKTOTAL, CKMB, CKMBINDEX, TROPONINI in the last 168 hours. BNP (last 3 results) No results for input(s): BNP in the last 8760 hours.  ProBNP (last 3 results) No results for input(s): PROBNP in the last 8760 hours.  CBG:  Recent Labs Lab 05/22/15 1141 05/22/15 1648 05/22/15 2111 05/23/15 0849 05/23/15 1145  GLUCAP 206* 148* 191* 175* 149*    Recent Results (from the past 240 hour(s))  Culture, blood (routine x 2)     Status: None (Preliminary result)   Collection Time: 05/19/15 11:10 AM  Result Value Ref Range Status   Specimen Description BLOOD RIGHT ARM  Final   Special Requests BOTTLES DRAWN AEROBIC AND ANAEROBIC 5CC  Final   Culture   Final    NO GROWTH 3 DAYS Performed at [facility]    Report Status PENDING  Incomplete  Culture, blood (routine x 2)     Status: None (Preliminary result)   Collection Time: 05/19/15 11:15 AM  Result Value Ref Range Status   Specimen Description BLOOD LEFT ARM  Final   Special Requests BOTTLES DRAWN AEROBIC AND ANAEROBIC 5CC  Final   Culture   Final    NO GROWTH 3 DAYS Performed at [facility]    Report Status PENDING  Incomplete  Urine culture     Status: None   Collection Time: 05/19/15 11:43 AM  Result Value Ref Range Status   Specimen Description URINE, CATHETERIZED  Final   Special Requests NONE  Final   Culture   Final    >=100,000 COLONIES/mL YEAST Performed at [facility]    Report Status 05/21/2015 FINAL  Final  C difficile quick scan w PCR reflex     Status: None   Collection Time: 05/21/15  4:16 PM  Result Value Ref Range Status   C Diff antigen NEGATIVE NEGATIVE Final   C Diff toxin NEGATIVE NEGATIVE Final   C Diff interpretation Negative for toxigenic C. difficile  Final  Surgical pcr screen     Status: None   Collection Time: 05/22/15  6:38 AM   Result Value Ref Range Status   MRSA, PCR NEGATIVE NEGATIVE Final   Staphylococcus aureus NEGATIVE NEGATIVE Final    Comment:        The Xpert SA Assay (FDA approved for NASAL specimens in patients over 80 years of age), is one component of a comprehensive surveillance program.  Test performance has been validated by [facility] for patients greater than or equal to 93 year old. It is not intended to diagnose infection nor to guide or monitor treatment.      Studies: Dg Chest 2 View  05/22/2015  CLINICAL DATA:  Pneumonia EXAM: CHEST  2 VIEW COMPARISON:  05/19/2015 FINDINGS: Linear density right lung  base slightly improved. This may represent atelectasis or pneumonia Left lower lobe scarring unchanged. Negative for heart failure or edema.  No pleural effusion. IMPRESSION: Linear density right lung base slightly improved. This may represent atelectasis or pneumonia Left lower lobe scarring unchanged. Electronically Signed   By: Franchot Gallo M.D.   On: 05/22/2015 08:44    Scheduled Meds: . aspirin  81 mg Oral Daily  . fluconazole  200 mg Oral Daily  . glimepiride  4 mg Oral QAC breakfast  . insulin aspart  0-9 Units Subcutaneous TID WC  . insulin glargine  8 Units Subcutaneous QHS  . piperacillin-tazobactam (ZOSYN)  IV  3.375 g Intravenous 3 times per day  . polyethylene glycol  17 g Oral Daily  . tamsulosin  0.4 mg Oral BID   Continuous Infusions: . sodium chloride 100 mL/hr at 05/23/15 1135    Principal Problem:   HCAP (healthcare-associated pneumonia) Active Problems:   Diabetes mellitus type II, uncontrolled ([place])   Hyperlipidemia   Obstructive sleep apnea   Essential hypertension   Sepsis ([place])   UTI (urinary tract infection)   Acute kidney injury ([place])    Time spent: 35 minutes.     Niel Hummer A  [facility] Pager [phone]. If 7PM-7AM, please contact night-coverage at [email or web address], password [facility] 05/23/2015, 12:14 PM  LOS: 3 days

## 2015-05-23 NOTE — Progress Notes (Signed)
Walter [name] is doing well this morning.  BP 124/67 mmHg  Pulse 72  Temp(Src) 98 F (36.7 C) (Oral)  Resp 18  Ht 6' (1.829 m)  Wt 90.89 kg (200 lb 6 oz)  BMI 27.17 kg/m2  SpO2 97%  Labs today are ok.  His Hgb is 10.5 which is a decline but his Cr is normal and WBC count is normal.  He remains on diflucan and zosyn.

## 2015-05-23 NOTE — Anesthesia Preprocedure Evaluation (Addendum)
Anesthesia Evaluation  Patient identified by MRN, date of birth, ID band Patient awake    Reviewed: Allergy & Precautions, NPO status , Patient's Chart, lab work & pertinent test results  History of Anesthesia Complications (+) history of anesthetic complications  Airway Mallampati: II  TM Distance: >3 FB Neck ROM: Full    Dental no notable dental hx.    Pulmonary sleep apnea , pneumonia,    Pulmonary exam normal breath sounds clear to auscultation       Cardiovascular hypertension, Pt. on medications Normal cardiovascular exam+ dysrhythmias  Rhythm:Regular Rate:Normal     Neuro/Psych  Neuromuscular disease negative psych ROS   GI/Hepatic negative GI ROS, Neg liver ROS,   Endo/Other  diabetes, Type 2, Oral Hypoglycemic Agents  Renal/GU Renal disease  negative genitourinary   Musculoskeletal negative musculoskeletal ROS (+)   Abdominal   Peds negative pediatric ROS (+)  Hematology negative hematology ROS (+)   Anesthesia Other Findings   Reproductive/Obstetrics negative OB ROS                             Anesthesia Physical Anesthesia Plan  ASA: III  Anesthesia Plan: General   Post-op Pain Management:    Induction: Intravenous  Airway Management Planned: LMA  Additional Equipment:   Intra-op Plan:   Post-operative Plan: Extubation in OR  Informed Consent: I have reviewed the patients History and Physical, chart, labs and discussed the procedure including the risks, benefits and alternatives for the proposed anesthesia with the patient or authorized representative who has indicated his/her understanding and acceptance.   Dental advisory given  Plan Discussed with: CRNA  Anesthesia Plan Comments:        Anesthesia Quick Evaluation

## 2015-05-24 LAB — CULTURE, BLOOD (ROUTINE X 2)
CULTURE: NO GROWTH
Culture: NO GROWTH

## 2015-05-24 LAB — CBC
HEMATOCRIT: 31.2 % — AB (ref 39.0–52.0)
HEMOGLOBIN: 10.4 g/dL — AB (ref 13.0–17.0)
MCH: 30.1 pg (ref 26.0–34.0)
MCHC: 33.3 g/dL (ref 30.0–36.0)
MCV: 90.2 fL (ref 78.0–100.0)
Platelets: 228 10*3/uL (ref 150–400)
RBC: 3.46 MIL/uL — ABNORMAL LOW (ref 4.22–5.81)
RDW: 14.2 % (ref 11.5–15.5)
WBC: 8.2 10*3/uL (ref 4.0–10.5)

## 2015-05-24 LAB — BASIC METABOLIC PANEL
ANION GAP: 6 (ref 5–15)
BUN: 10 mg/dL (ref 6–20)
CO2: 27 mmol/L (ref 22–32)
Calcium: 8.7 mg/dL — ABNORMAL LOW (ref 8.9–10.3)
Chloride: 105 mmol/L (ref 101–111)
Creatinine, Ser: 0.89 mg/dL (ref 0.61–1.24)
GFR calc Af Amer: >60 mL/min (ref >60)
GFR calc non Af Amer: >60 mL/min (ref >60)
GLUCOSE: 192 mg/dL — AB (ref 65–99)
POTASSIUM: 3.9 mmol/L (ref 3.5–5.1)
Sodium: 138 mmol/L (ref 135–145)

## 2015-05-24 LAB — GLUCOSE, CAPILLARY
GLUCOSE-CAPILLARY: 202 mg/dL — AB (ref 65–99)
Glucose-Capillary: 163 mg/dL — ABNORMAL HIGH (ref 65–99)
Glucose-Capillary: 119 mg/dL — ABNORMAL HIGH (ref 65–99)
Glucose-Capillary: 219 mg/dL — ABNORMAL HIGH (ref 65–99)

## 2015-05-24 MED ORDER — AMOXICILLIN-POT CLAVULANATE 875-125 MG PO TABS
1.0000 | ORAL_TABLET | Freq: Two times a day (BID) | ORAL | Status: DC
  Administered 2015-05-24 – 2015-05-25 (×3): 1 via ORAL
  Filled 2015-05-24 (×3): qty 1

## 2015-05-24 NOTE — Progress Notes (Signed)
[facility] PROGRESS NOTE  Walter [name] [id number] DOB: [birth year] DOA: 05/19/2015 PCP: [name], MD  Assessment/Plan: 75 year old male with a past medical history significant for HTN, DM type II, HLD, colon cancer s/p resection in 1992, OSA on CPAP; who was admitted directly from PACU by Dr. Jeffie Pollock, hypertension, worsening renal function, confusion, weakness, uncontrolled CBG. Patient has had difficulty with urination/retention requiring replacement of Foley catheter since November 2016, was admitted 11/27- 12/1 for urinary tract infection after he failed outpatient ciprofloxacin. He was admitted with sepsis physiology. He was transitioned to Ceftin and discharged home with indwelling Foley and advised to follow-up with urology. Patient was also started on Flomax , was scheduled for TURP, 1-17, patient was also prescribed one week of Septra and ciprofloxacin perioperatively to cover him for recurrent UTIs. Upon arrival to PACU, he was found to have a CBG of 300, creatinine 1.69, procedure was canceled and the patient was admitted for possible sepsis.lactic acid was found to be 1.9.  HCAP;  Presents with hypotension, renal failure, confusion. Chest x ray with small area of infiltrates.  He was in the hospital 3 weeks ago. Will continue with treatment for Health care associated PNA.  Chest x ray stable, improved.  Day 6. Will discontinue vancomycin today, MRSA PCR negative Start oral Augmentin   SIRS; Secondary to UTI vs PNA. Presents with Hypotension, tachycardia, AKI.   UTI; follow urine culture. Urology following. On zosyn.  Recurrent UTI, TURP  for BPH with retention , plan on procedure  Urine growing yeast. On  fluconazole.   Post TURP;  Per urology   Diabetes mellitus type II, uncontrolled ([place]) last hemoglobin A1c 8.9, started on sliding scale insulin Continue Amaryl.  hold metformin Continue with  lantus.  Acute kidney injury-baseline 0.98, peak to 1.6. Continue  with fluids. resolved  Hyperlipidemia-continue statin  Anemia; hb stable.   Obstructive sleep apnea on CPAP  Essential hypertension- Hold Norvasc and Avapro , all antihypertensive medications    Code Status: Full code.  Family Communication: care discussed with patient  Disposition Plan: discharge home when ok by urology    Consultants:  Dr Roni Bread, primary   Procedures:  none  Antibiotics:  Vancomycin 1-18---1-20  Zosyn 1-17  HPI/Subjective: Feeling well, he is breathing well.  No new complaints.  Objective: Filed Vitals:   05/24/15 0542 05/24/15 1416  BP: 126/66 117/58  Pulse: 66 83  Temp: 98.2 F (36.8 C) 98 F (36.7 C)  Resp: 16 16    Intake/Output Summary (Last 24 hours) at 05/24/15 2035 Last data filed at 05/24/15 1418  Gross per 24 hour  Intake   7040 ml  Output   7125 ml  Net    -85 ml   Filed Weights   05/19/15 1006  Weight: 90.89 kg (200 lb 6 oz)    Exam:   General:  NAD  Cardiovascular: S 1, S 2 RRR  Respiratory: cta  Abdomen: bs present, soft, nt  Musculoskeletal: no edema  Data Reviewed: Basic Metabolic Panel:  Recent Labs Lab 05/19/15 1000 05/19/15 1110 05/20/15 0543 05/21/15 0535 05/23/15 0550 05/24/15 1028  NA 130*  --  132* 136 140 138  K 4.4  --  4.3 4.1 4.5 3.9  CL 94*  --  96* 103 105 105  CO2 24  --  25 23 27 27   GLUCOSE 315*  --  186* 229* 194* 192*  BUN 28*  --  20 15 11 10   CREATININE 1.72* 1.68* 1.07 0.95 0.94  0.89  CALCIUM 9.2  --  8.2* 8.8* 8.7* 8.7*   Liver Function Tests:  Recent Labs Lab 05/19/15 1000 05/20/15 0543  AST 21 20  ALT 26 25  ALKPHOS 51 45  BILITOT 0.9 1.0  PROT 7.5 6.1*  ALBUMIN 3.8 3.0*   No results for input(s): LIPASE, AMYLASE in the last 168 hours. No results for input(s): AMMONIA in the last 168 hours. CBC:  Recent Labs Lab 05/19/15 1110 05/20/15 0543 05/21/15 0535 05/23/15 0550 05/24/15 1028  WBC 8.2 5.6 5.1 6.1 8.2  HGB 12.4* 10.9* 11.4* 10.5* 10.4*  HCT  35.9* 32.4* 33.4* 31.6* 31.2*  MCV 88.2 90.3 88.6 89.5 90.2  PLT 151 144* 168 189 228   Cardiac Enzymes: No results for input(s): CKTOTAL, CKMB, CKMBINDEX, TROPONINI in the last 168 hours. BNP (last 3 results) No results for input(s): BNP in the last 8760 hours.  ProBNP (last 3 results) No results for input(s): PROBNP in the last 8760 hours.  CBG:  Recent Labs Lab 05/23/15 1646 05/23/15 2118 05/24/15 0745 05/24/15 1226 05/24/15 1739  GLUCAP 218* 260* 163* 119* 202*    Recent Results (from the past 240 hour(s))  Culture, blood (routine x 2)     Status: None   Collection Time: 05/19/15 11:10 AM  Result Value Ref Range Status   Specimen Description BLOOD RIGHT ARM  Final   Special Requests BOTTLES DRAWN AEROBIC AND ANAEROBIC 5CC  Final   Culture   Final    NO GROWTH 5 DAYS Performed at [facility]    Report Status 05/24/2015 FINAL  Final  Culture, blood (routine x 2)     Status: None   Collection Time: 05/19/15 11:15 AM  Result Value Ref Range Status   Specimen Description BLOOD LEFT ARM  Final   Special Requests BOTTLES DRAWN AEROBIC AND ANAEROBIC 5CC  Final   Culture   Final    NO GROWTH 5 DAYS Performed at [facility]    Report Status 05/24/2015 FINAL  Final  Urine culture     Status: None   Collection Time: 05/19/15 11:43 AM  Result Value Ref Range Status   Specimen Description URINE, CATHETERIZED  Final   Special Requests NONE  Final   Culture   Final    >=100,000 COLONIES/mL YEAST Performed at [facility]    Report Status 05/21/2015 FINAL  Final  C difficile quick scan w PCR reflex     Status: None   Collection Time: 05/21/15  4:16 PM  Result Value Ref Range Status   C Diff antigen NEGATIVE NEGATIVE Final   C Diff toxin NEGATIVE NEGATIVE Final   C Diff interpretation Negative for toxigenic C. difficile  Final  Surgical pcr screen     Status: None   Collection Time: 05/22/15  6:38 AM  Result Value Ref Range Status   MRSA, PCR  NEGATIVE NEGATIVE Final   Staphylococcus aureus NEGATIVE NEGATIVE Final    Comment:        The Xpert SA Assay (FDA approved for NASAL specimens in patients over 6 years of age), is one component of a comprehensive surveillance program.  Test performance has been validated by [facility] for patients greater than or equal to 38 year old. It is not intended to diagnose infection nor to guide or monitor treatment.      Studies: No results found.  Scheduled Meds: . amoxicillin-clavulanate  1 tablet Oral BID  . aspirin  81 mg Oral Daily  .  fluconazole  200 mg Oral Daily  . glimepiride  4 mg Oral QAC breakfast  . insulin aspart  0-9 Units Subcutaneous TID WC  . insulin glargine  8 Units Subcutaneous QHS  . polyethylene glycol  17 g Oral Daily  . tamsulosin  0.4 mg Oral BID   Continuous Infusions: . sodium chloride 100 mL/hr at 05/24/15 1758    Principal Problem:   HCAP (healthcare-associated pneumonia) Active Problems:   Diabetes mellitus type II, uncontrolled ([place])   Hyperlipidemia   Obstructive sleep apnea   Essential hypertension   Sepsis ([place])   UTI (urinary tract infection)   Acute kidney injury ([place])    Time spent: 25 minutes.     Niel Hummer A  [facility] Pager [phone]. If 7PM-7AM, please contact night-coverage at [email or web address], password [facility] 05/24/2015, 8:35 PM  LOS: 4 days

## 2015-05-24 NOTE — Progress Notes (Signed)
1 Day Post-Op Subjective: Patient reports no new complaints or concerns. Urine today is very light pink in color. Catheter is draining well.  Objective: Vital signs in last 24 hours: Temp:  [97.4 F (36.3 C)-98.2 F (36.8 C)] 98.2 F (36.8 C) (01/22 0542) Pulse Rate:  [62-70] 66 (01/22 0542) Resp:  [14-16] 16 (01/22 0542) BP: (120-141)/(63-72) 126/66 mmHg (01/22 0542) SpO2:  [93 %-100 %] 93 % (01/22 0542)  Intake/Output from previous day: 01/21 0701 - 01/22 0700 In: [id number] [I.V.:3025; IV Piggyback:150] Out: [id number] [Urine:11350] Intake/Output this shift: Total I/O In: 360 [P.O.:360] Out: 525 [Urine:525]  Physical Exam:  Constitutional: Vital signs reviewed. WD WN in NAD   Eyes: PERRL, No scleral icterus.   Cardiovascular: RRR Pulmonary/Chest: Normal effort Abdominal: Soft. Non-tender, non-distended, bowel sounds are normal, no masses, organomegaly, or guarding present.  Genitourinary: Foley in place. Urine light pink in color. No other concerns. Extremities: No cyanosis or edema   Lab Results:  Recent Labs  05/23/15 0550  HGB 10.5*  HCT 31.6*   BMET  Recent Labs  05/23/15 0550  NA 140  K 4.5  CL 105  CO2 27  GLUCOSE 194*  BUN 11  CREATININE 0.94  CALCIUM 8.7*   No results for input(s): LABPT, INR in the last 72 hours. No results for input(s): LABURIN in the last 72 hours. Results for orders placed or performed during the hospital encounter of 05/19/15  Culture, blood (routine x 2)     Status: None (Preliminary result)   Collection Time: 05/19/15 11:10 AM  Result Value Ref Range Status   Specimen Description BLOOD RIGHT ARM  Final   Special Requests BOTTLES DRAWN AEROBIC AND ANAEROBIC 5CC  Final   Culture   Final    NO GROWTH 4 DAYS Performed at [facility]    Report Status PENDING  Incomplete  Culture, blood (routine x 2)     Status: None (Preliminary result)   Collection Time: 05/19/15 11:15 AM  Result Value Ref Range Status   Specimen  Description BLOOD LEFT ARM  Final   Special Requests BOTTLES DRAWN AEROBIC AND ANAEROBIC 5CC  Final   Culture   Final    NO GROWTH 4 DAYS Performed at [facility]    Report Status PENDING  Incomplete  Urine culture     Status: None   Collection Time: 05/19/15 11:43 AM  Result Value Ref Range Status   Specimen Description URINE, CATHETERIZED  Final   Special Requests NONE  Final   Culture   Final    >=100,000 COLONIES/mL YEAST Performed at [facility]    Report Status 05/21/2015 FINAL  Final  C difficile quick scan w PCR reflex     Status: None   Collection Time: 05/21/15  4:16 PM  Result Value Ref Range Status   C Diff antigen NEGATIVE NEGATIVE Final   C Diff toxin NEGATIVE NEGATIVE Final   C Diff interpretation Negative for toxigenic C. difficile  Final  Surgical pcr screen     Status: None   Collection Time: 05/22/15  6:38 AM  Result Value Ref Range Status   MRSA, PCR NEGATIVE NEGATIVE Final   Staphylococcus aureus NEGATIVE NEGATIVE Final    Comment:        The Xpert SA Assay (FDA approved for NASAL specimens in patients over 70 years of age), is one component of a comprehensive surveillance program.  Test performance has been validated by [facility] for patients greater than or  equal to 34 year old. It is not intended to diagnose infection nor to guide or monitor treatment.     Studies/Results: No results found.  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 Plan is for voiding trial tomorrow morning. Urine is very clear this morning so we will discontinue CBI to increase his opportunity to ambulate today.   LOS: 4 days   Walter [name] S 05/24/2015, 9:10 AM

## 2015-05-25 ENCOUNTER — Encounter (HOSPITAL_COMMUNITY): Payer: Self-pay | Admitting: Urology

## 2015-05-25 LAB — CBC
HEMATOCRIT: 35 % — AB (ref 39.0–52.0)
HEMOGLOBIN: 11.8 g/dL — AB (ref 13.0–17.0)
MCH: 30.2 pg (ref 26.0–34.0)
MCHC: 33.7 g/dL (ref 30.0–36.0)
MCV: 89.5 fL (ref 78.0–100.0)
Platelets: 256 10*3/uL (ref 150–400)
RBC: 3.91 MIL/uL — AB (ref 4.22–5.81)
RDW: 14.2 % (ref 11.5–15.5)
WBC: 6.7 10*3/uL (ref 4.0–10.5)

## 2015-05-25 LAB — GLUCOSE, CAPILLARY: Glucose-Capillary: 232 mg/dL — ABNORMAL HIGH (ref 65–99)

## 2015-05-25 MED ORDER — FLUCONAZOLE 200 MG PO TABS: 200.0000 mg | ORAL_TABLET | Freq: Every day | ORAL | Status: DC

## 2015-05-25 NOTE — Progress Notes (Signed)
Foley catheter removed per order. 3 string bottles set up in bathroom. Educated pt on urinating in the urinal so we can transfer urine into bottles. Will continue to monitor and assess as needed.

## 2015-05-25 NOTE — Care Management Important Message (Signed)
Important Message  Patient Details  Name: Walter [name] MRN: [id number] Date of Birth: 1940/07/30   Medicare Important Message Given:  Yes    Camillo Flaming 05/25/2015, 12:35 [place] Message  Patient Details  Name: Walter [name] MRN: [id number] Date of Birth: August 25, 1940   Medicare Important Message Given:  Yes    Camillo Flaming 05/25/2015, 12:35 PM

## 2015-05-25 NOTE — Discharge Instructions (Signed)
Transurethral Resection of the Prostate, Care After °Refer to this sheet in the next few weeks. These instructions provide you with information on caring for yourself after your procedure. Your caregiver also may give you specific instructions. Your treatment has been planned according to current medical practices, but complications sometimes occur. Call your caregiver if you have any problems or questions after your procedure. °HOME CARE INSTRUCTIONS  °Recovery can take 4-6 weeks. Avoid alcohol, caffeinated drinks, and spicy foods for 2 weeks after your procedure. Drink enough fluids to keep your urine clear or pale yellow. Urinate as soon as you feel the urge to do so. Do not try to hold your urine for long periods of time. °During recovery you may experience pain caused by bladder spasms, which result in a very intense urge to urinate. Take all medicines as directed by your caregiver, including medicines for pain. Try to limit the amount of pain medicines you take because it can cause constipation. If you do become constipated, do not strain to move your bowels. Straining can increase bleeding. Constipation can be minimized by increasing the amount fluids and fiber in your diet. Your caregiver also may prescribe a stool softener. °Do not lift heavy objects (more than 5 lb [2.25 kg]) or perform exercises that cause you to strain for at least 1 month after your procedure. When sitting, you may want to sit in a soft chair or use a cushion. For the first 10 days after your procedure, avoid the following activities: °· Running. °· Strenuous work. °· Long walks. °· Riding in a car for extended periods. °· Sex. °SEEK MEDICAL CARE IF: °· You have difficulty urinating. °· You have blood in your urine that does not go away after you rest or increase your fluid intake. °· You have swelling in your penis or scrotum. °SEEK IMMEDIATE MEDICAL CARE IF:  °· You are suddenly unable to urinate. °· You notice blood clots in your  urine. °· You have chills. °· You have a fever. °· You have pain in your back or lower abdomen. °· You have pain or swelling in your legs. °MAKE SURE YOU:  °· Understand these instructions. °· Will watch your condition. °· Will get help right away if you are not doing well or get worse. °  °This information is not intended to replace advice given to you by your health care provider. Make sure you discuss any questions you have with your health care provider. °  °Document Released: 04/18/2005 Document Revised: 05/09/2014 Document Reviewed: 05/27/2011 °Elsevier Interactive Patient Education ©2016 [employer] Inc[employer] ° °

## 2015-05-25 NOTE — Discharge Summary (Signed)
Physician Discharge Summary  Patient ID: Walter [name] MRN: [id number] DOB/AGE: 10/23/1940 75 y.o.  Admit date: 05/19/2015 Discharge date: 05/25/2015  Admission Diagnoses:  Urinary retention  Discharge Diagnoses:  Principal Problem:   Urinary retention Active Problems:   Diabetes mellitus type II, uncontrolled ([place])   Hyperlipidemia   Obstructive sleep apnea   Essential hypertension   Sepsis ([place])   UTI (urinary tract infection)   HCAP (healthcare-associated pneumonia)   Acute kidney injury [facility])   Past Medical History  Diagnosis Date  . Hypertension   . Hyperlipidemia   . Diabetes mellitus   . History of shingles   . Colon cancer ([place]) 1992    colon resection 1992  . Sleep apnea     cpap  . Complication of anesthesia     bladder problems after surgery from Anesthesia- can not urinate    Surgeries: Procedure(s): TRANSURETHRAL RESECTION OF THE PROSTATE (TURP) on 05/19/2015 - 05/23/2015   Consultants (if any):    Discharged Condition: Improved  Hospital Course: Walter [name] is an 75 y.o. male who was admitted 05/19/2015 with a diagnosis of Urinary retention and Sepsis which was felt initially to be secondary to a UTI but he was also found to have a HCAP in the RLL.   He was started on Zosyn and Vancomycin and the hospitalists were consulted.  His blood cultures were negative but the urine grew yeast and fluconazole was added.  He had an obstructed foley last week from fungal elements but eventually went to the operating room on  05/23/2015 and underwent the above named procedures.   Because of his history of a hypotonic bladder the foley was left for two days and was removed this morning.   He is currently voiding clear urine without difficulty.  I will check a PVR this morning and send him home without the foley if it is acceptable.   He will be continued to the fluconazole for 2 days but no further antibiotics.   His AKI resolved and his anemia is improving at discharge.   He was  given perioperative antibiotics:  Anti-infectives    Start     Dose/Rate Route Frequency Ordered Stop   05/25/15 0000  fluconazole (DIFLUCAN) 200 MG tablet     200 mg Oral Daily 05/25/15 0744     05/24/15 1000  amoxicillin-clavulanate (AUGMENTIN) 875-125 MG per tablet 1 tablet     1 tablet Oral 2 times daily 05/24/15 0917 05/26/15 0959   05/21/15 1000  fluconazole (DIFLUCAN) tablet 200 mg     200 mg Oral Daily 05/21/15 0804     05/21/15 0600  vancomycin (VANCOCIN) IVPB 1000 mg/200 mL premix  Status:  Discontinued     1,000 mg 200 mL/hr over 60 Minutes Intravenous Every 12 hours 05/20/15 1520 05/22/15 1348   05/20/15 1630  vancomycin (VANCOCIN) IVPB 1000 mg/200 mL premix     1,000 mg 200 mL/hr over 60 Minutes Intravenous  Once 05/20/15 1517 05/20/15 1714   05/19/15 1500  piperacillin-tazobactam (ZOSYN) IVPB 3.375 g  Status:  Discontinued     3.375 g 12.5 mL/hr over 240 Minutes Intravenous 3 times per day 05/19/15 1354 05/24/15 0916   05/19/15 0933  ceFAZolin (ANCEF) IVPB 2 g/50 mL premix  Status:  Discontinued     2 g 100 mL/hr over 30 Minutes Intravenous 30 min pre-op 05/19/15 0933 05/19/15 1312    .  He was given sequential compression devices, early ambulation, and Lovenox for DVT prophylaxis.  He benefited  maximally from the hospital stay and there were no complications.    Recent vital signs:  Filed Vitals:   05/24/15 2050 05/25/15 0612  BP: 142/72 108/89  Pulse: 67 98  Temp: 98.7 F (37.1 C) 97.8 F (36.6 C)  Resp: 18 18    Recent laboratory studies:  Lab Results  Component Value Date   HGB 11.8* 05/25/2015   HGB 10.4* 05/24/2015   HGB 10.5* 05/23/2015   Lab Results  Component Value Date   WBC 6.7 05/25/2015   PLT 256 05/25/2015   No results found for: INR Lab Results  Component Value Date   NA 138 05/24/2015   K 3.9 05/24/2015   CL 105 05/24/2015   CO2 27 05/24/2015   BUN 10 05/24/2015   CREATININE 0.89 05/24/2015   GLUCOSE 192* 05/24/2015     Discharge Medications:     Medication List    STOP taking these medications        oxyCODONE 5 MG immediate release tablet  Commonly known as:  Oxy IR/ROXICODONE     tamsulosin 0.4 MG Caps capsule  Commonly known as:  FLOMAX      TAKE these medications        acetaminophen 500 MG tablet  Commonly known as:  TYLENOL  Take 1,000 mg by mouth every 6 (six) hours as needed for moderate pain.     amLODipine 5 MG tablet  Commonly known as:  NORVASC  TAKE 1 TABLET (5 MG TOTAL) BY MOUTH DAILY.     aspirin 81 MG tablet  Take 81 mg by mouth daily.     atorvastatin 20 MG tablet  Commonly known as:  LIPITOR  Take 1 pill by mouth once per week     BETA CAROTENE PO  Take by mouth.     CENTRUM SILVER PO  Take 1 tablet by mouth daily.     CHROMIUM PICOLINATE PO  Take 200 mg by mouth daily.     CINNAMON PO  Take 200 mg by mouth daily.     fluconazole 200 MG tablet  Commonly known as:  DIFLUCAN  Take 1 tablet (200 mg total) by mouth daily.     glimepiride 4 MG tablet  Commonly known as:  AMARYL  Take 1 tablet (4 mg total) by mouth daily before breakfast.     hydrocortisone 2.5 % rectal cream  Commonly known as:  ANUSOL-HC  Place 1 application rectally every 6 (six) hours as needed for hemorrhoids or itching.     metFORMIN 1000 MG tablet  Commonly known as:  GLUCOPHAGE  Take 1,000 mg by mouth 2 (two) times daily with a meal.     polyethylene glycol packet  Commonly known as:  MIRALAX / GLYCOLAX  Take 17 g by mouth daily as needed for moderate constipation.     saw palmetto 160 MG capsule  Take 160 mg by mouth daily.     SYSTANE OP  Apply to eye.     telmisartan 40 MG tablet  Commonly known as:  MICARDIS  TAKE 1 TABLET (40 MG TOTAL) BY MOUTH DAILY.        Diagnostic Studies: Dg Chest 2 View  05/22/2015  CLINICAL DATA:  Pneumonia EXAM: CHEST  2 VIEW COMPARISON:  05/19/2015 FINDINGS: Linear density right lung base slightly improved. This may represent  atelectasis or pneumonia Left lower lobe scarring unchanged. Negative for heart failure or edema.  No pleural effusion. IMPRESSION: Linear density right lung base slightly improved. This  may represent atelectasis or pneumonia Left lower lobe scarring unchanged. Electronically Signed   By: Franchot Gallo M.D.   On: 05/22/2015 08:44   Dg Chest Port 1 View  05/19/2015  CLINICAL DATA:  Hypertension.  Colon carcinoma.  Sepsis. EXAM: PORTABLE CHEST 1 VIEW COMPARISON:  March 29, 2015 FINDINGS: There is a small area of infiltrate in the right lower lobe. There is stable atelectasis in the left base. The lungs elsewhere are clear. Heart size and pulmonary vascularity are normal. No adenopathy. No bone lesions. There is degenerative change in the lower thoracic region. IMPRESSION: Small area of infiltrate, presumably pneumonia, right lower lobe. Stable atelectasis left base. Lungs elsewhere clear. No change in cardiac silhouette. Electronically Signed   By: Lowella Grip III M.D.   On: 05/19/2015 14:36    Disposition: 01-Home or Self Care      Discharge Instructions    Discontinue IV    Complete by:  As directed            Follow-up Information    Follow up with Malka So, MD.   Specialty:  Urology   Why:  as scheduled   Contact information:   [place] [address] [phone]        Signed: Malka So 05/25/2015, 7:46 AM

## 2015-05-25 NOTE — Care Management Note (Signed)
Case Management Note  Patient Details  Name: Walter [name] MRN: [id number] Date of Birth: 1940-12-11  Subjective/Objective:                    Action/Plan:d/c home no needs or orders.   Expected Discharge Date:                  Expected Discharge Plan:  Home/Self Care  In-House Referral:     Discharge planning Services  CM Consult  Post Acute Care Choice:    Choice offered to:     DME Arranged:    DME Agency:     HH Arranged:    Agency Agency:     Status of Service:  Completed, signed off  Medicare Important Message Given:    Date Medicare IM Given:    Medicare IM give by:    Date Additional Medicare IM Given:    Additional Medicare Important Message give by:     If discussed at [place] of Stay Meetings, dates discussed:    Additional Comments:  Dessa Phi, RN 05/25/2015, 12:01 PM

## 2015-06-04 ENCOUNTER — Other Ambulatory Visit: Payer: Self-pay | Admitting: Family Medicine

## 2015-06-15 ENCOUNTER — Other Ambulatory Visit: Payer: Self-pay | Admitting: Family Medicine

## 2015-06-15 MED ORDER — GLUCOSE BLOOD VI STRP: ORAL_STRIP | Status: DC

## 2015-06-15 NOTE — Telephone Encounter (Signed)
Rx done. 

## 2015-06-15 NOTE — Telephone Encounter (Signed)
[id number] - [place], [place] - [address] AT [address] [phone]  Requesting script for ONE TOUCH ULTRA TEST STRIPS  #100, last filled 10/24/14

## 2015-06-19 ENCOUNTER — Observation Stay (HOSPITAL_COMMUNITY)
Admission: EM | Admit: 2015-06-19 | Discharge: 2015-06-20 | Disposition: A | Discharge status: Home / Self Care | Payer: Medicare Other | Source: Home / Self Care | Attending: Emergency Medicine | Admitting: Emergency Medicine

## 2015-06-19 ENCOUNTER — Encounter (HOSPITAL_COMMUNITY): Payer: Self-pay | Admitting: Emergency Medicine

## 2015-06-19 DIAGNOSIS — R310 Gross hematuria: Secondary | ICD-10-CM | POA: Diagnosis not present

## 2015-06-19 DIAGNOSIS — N99820 Postprocedural hemorrhage and hematoma of a genitourinary system organ or structure following a genitourinary system procedure: Secondary | ICD-10-CM | POA: Diagnosis not present

## 2015-06-19 DIAGNOSIS — R319 Hematuria, unspecified: Secondary | ICD-10-CM

## 2015-06-19 DIAGNOSIS — R338 Other retention of urine: Secondary | ICD-10-CM | POA: Diagnosis present

## 2015-06-19 DIAGNOSIS — R339 Retention of urine, unspecified: Secondary | ICD-10-CM

## 2015-06-19 DIAGNOSIS — N390 Urinary tract infection, site not specified: Secondary | ICD-10-CM

## 2015-06-19 LAB — BASIC METABOLIC PANEL
ANION GAP: 11 (ref 5–15)
BUN: 20 mg/dL (ref 6–20)
CALCIUM: 9.8 mg/dL (ref 8.9–10.3)
CO2: 24 mmol/L (ref 22–32)
Chloride: 102 mmol/L (ref 101–111)
Creatinine, Ser: 0.98 mg/dL (ref 0.61–1.24)
GFR calc non Af Amer: >60 mL/min (ref >60)
GLUCOSE: 229 mg/dL — AB (ref 65–99)
POTASSIUM: 4.4 mmol/L (ref 3.5–5.1)
Sodium: 137 mmol/L (ref 135–145)

## 2015-06-19 LAB — CBC WITH DIFFERENTIAL/PLATELET
Basophils Absolute: 0 10*3/uL (ref 0.0–0.1)
Basophils Relative: 0 %
EOS PCT: 2 %
Eosinophils Absolute: 0.2 10*3/uL (ref 0.0–0.7)
HCT: 38.5 % — ABNORMAL LOW (ref 39.0–52.0)
HEMOGLOBIN: 12.9 g/dL — AB (ref 13.0–17.0)
LYMPHS ABS: 1.4 10*3/uL (ref 0.7–4.0)
LYMPHS PCT: 14 %
MCH: 30.2 pg (ref 26.0–34.0)
MCHC: 33.5 g/dL (ref 30.0–36.0)
MCV: 90.2 fL (ref 78.0–100.0)
MONOS PCT: 8 %
Monocytes Absolute: 0.7 10*3/uL (ref 0.1–1.0)
NEUTROS PCT: 76 %
Neutro Abs: 7.3 10*3/uL (ref 1.7–7.7)
Platelets: 223 10*3/uL (ref 150–400)
RBC: 4.27 MIL/uL (ref 4.22–5.81)
RDW: 14.4 % (ref 11.5–15.5)
WBC: 9.7 10*3/uL (ref 4.0–10.5)

## 2015-06-19 MED ORDER — POTASSIUM CHLORIDE IN NACL 20-0.45 MEQ/L-% IV SOLN
INTRAVENOUS | Status: DC
  Administered 2015-06-20: 02:00:00 via INTRAVENOUS
  Filled 2015-06-19 (×3): qty 1000

## 2015-06-19 MED ORDER — DIPHENHYDRAMINE HCL 50 MG/ML IJ SOLN: 12.5000 mg | Freq: Four times a day (QID) | INTRAMUSCULAR | Status: DC | PRN

## 2015-06-19 MED ORDER — ONDANSETRON HCL 4 MG/2ML IJ SOLN: 4.0000 mg | INTRAMUSCULAR | Status: DC | PRN

## 2015-06-19 MED ORDER — HYDROMORPHONE HCL 1 MG/ML IJ SOLN: 0.5000 mg | INTRAMUSCULAR | Status: DC | PRN

## 2015-06-19 MED ORDER — ACETAMINOPHEN 325 MG PO TABS: 650.0000 mg | ORAL_TABLET | ORAL | Status: DC | PRN

## 2015-06-19 MED ORDER — INSULIN ASPART 100 UNIT/ML ~~LOC~~ SOLN
0.0000 [IU] | Freq: Three times a day (TID) | SUBCUTANEOUS | Status: DC
  Administered 2015-06-20: 3 [IU] via SUBCUTANEOUS

## 2015-06-19 MED ORDER — BISACODYL 10 MG RE SUPP: 10.0000 mg | Freq: Every day | RECTAL | Status: DC | PRN

## 2015-06-19 MED ORDER — ZOLPIDEM TARTRATE 5 MG PO TABS: 5.0000 mg | ORAL_TABLET | Freq: Every evening | ORAL | Status: DC | PRN

## 2015-06-19 MED ORDER — HYDROCODONE-ACETAMINOPHEN 5-325 MG PO TABS: 1.0000 | ORAL_TABLET | ORAL | Status: DC | PRN

## 2015-06-19 MED ORDER — DIPHENHYDRAMINE HCL 12.5 MG/5ML PO ELIX: 12.5000 mg | ORAL_SOLUTION | Freq: Four times a day (QID) | ORAL | Status: DC | PRN

## 2015-06-19 NOTE — ED Notes (Signed)
Pt states that he had a TURP 4 weeks ago and has been doing well until several days ago when he started having blood and clots in his urination. Since 830pm tonight he has not been able to urinate. Urgency. Alert and oriented.

## 2015-06-19 NOTE — ED Provider Notes (Signed)
CSN: [id number]     Arrival date & time 06/19/15  2151 History   First MD Initiated Contact with Patient 06/19/15 2202     Chief Complaint  Patient presents with  . Urinary Retention     (Consider location/radiation/quality/duration/timing/severity/associated sxs/prior Treatment) HPI 75 year old male who presents with urinary retention. Status post TURP on 05/23/2015. Has been urinating since then but recently was diagnosed with UTI and was started on Keflex. Begin to notice urinary retention with hematuria over course of the day. Sent in by Dr. Roni Bread for management. No fever, chills, nausea, vomiting. Takes aspirin. No other blood thinners.    Past Medical History  Diagnosis Date  . Hypertension   . Hyperlipidemia   . Diabetes mellitus   . History of shingles   . Colon cancer ([place]) 1992    colon resection 1992  . Sleep apnea     cpap  . Complication of anesthesia     bladder problems after surgery from Anesthesia- can not urinate   Past Surgical History  Procedure Laterality Date  . Colectomy  1992  . Appendectomy  1949  . Hemorrhoid surgery    . Transurethral resection of prostate N/A 05/23/2015    Procedure: TRANSURETHRAL RESECTION OF THE PROSTATE (TURP);  Surgeon: Irine Seal, MD;  Location: [facility];  Service: Urology;  Laterality: N/A;   Family History  Problem Relation Age of Onset  . Cancer      colon/fhx  . Colon cancer Father 30  . Rectal cancer Maternal Uncle   . Colon cancer Paternal Grandmother 62  . Diabetes Father     type I  . Alzheimer's disease Mother    Social History  Substance Use Topics  . Smoking status: Never Smoker   . Smokeless tobacco: Never Used     Comment: smoked for less that 6 months in 1960  . Alcohol Use: 1.2 oz/week    2 Cans of beer per week     Comment: 2 cans previously, cut out now to help with diabetes control. rare    Review of Systems 10/14 systems reviewed and are negative other than those stated in the  HPI    Allergies  Review of patient's allergies indicates no known allergies.  Home Medications   Prior to Admission medications   Medication Sig Start Date End Date Taking? Authorizing Provider  amLODipine (NORVASC) 5 MG tablet TAKE 1 TABLET (5 MG TOTAL) BY MOUTH DAILY. 03/30/15  Yes [name], MD  aspirin 81 MG tablet Take 81 mg by mouth daily.   Yes Historical Provider, MD  atorvastatin (LIPITOR) 20 MG tablet TAKE 1 TABLET BY MOUTH ONCE PER WEEK Patient taking differently: TAKE 1 TABLET BY MOUTH ONCE PER WEEK on Sundays 06/04/15  Yes [name], MD  BETA CAROTENE PO Take by mouth.   Yes Historical Provider, MD  cephALEXin (KEFLEX) 500 MG capsule Take 500 mg by mouth 2 (two) times daily. 06/16/15  Yes Historical Provider, MD  CHROMIUM PICOLINATE PO Take 200 mg by mouth daily.   Yes Historical Provider, MD  CINNAMON PO Take 200 mg by mouth daily.    Yes Historical Provider, MD  glimepiride (AMARYL) 4 MG tablet Take 1 tablet (4 mg total) by mouth daily before breakfast. 04/09/15  Yes [name], MD  metFORMIN (GLUCOPHAGE) 1000 MG tablet TAKE 1 TABLET (1,000 MG TOTAL) BY MOUTH 2 (TWO) TIMES DAILY WITH A MEAL. 06/04/15  Yes [name], MD  Multiple Vitamins-Minerals (CENTRUM SILVER  PO) Take 1 tablet by mouth daily.    Yes Historical Provider, MD  Polyethyl Glycol-Propyl Glycol (SYSTANE OP) Apply to eye.   Yes Historical Provider, MD  saw palmetto 160 MG capsule Take 160 mg by mouth daily.    Yes Historical Provider, MD  telmisartan (MICARDIS) 40 MG tablet TAKE 1 TABLET (40 MG TOTAL) BY MOUTH DAILY. 03/30/15  Yes [name], MD  glucose blood (ONE TOUCH ULTRA TEST) test strip Use as instructed to check blood sugar once a day 06/15/15   [name], MD   BP 144/76 mmHg  Pulse 96  Temp(Src) 97.9 F (36.6 C) (Oral)  Resp 18  SpO2 97% Physical Exam Physical Exam  Nursing note and vitals reviewed. Constitutional: Well developed, well nourished, non-toxic, and  in no acute distress Head: Normocephalic and atraumatic.  Mouth/Throat: Oropharynx is clear and moist.  Neck: Neck supple.  Cardiovascular: Normal rate regular rhythm  Pulmonary/Chest: Effort normal  Abdominal: Soft. There is no tenderness. There is no rebound and no guarding.  Musculoskeletal: Normal range of motion.  Neurological: Alert, no facial droop, fluent speech Skin: Skin is warm and dry.  Psychiatric: Cooperative  ED Course  Procedures (including critical care time) Labs Review Labs Reviewed  CBC WITH DIFFERENTIAL/PLATELET - Abnormal; Notable for the following:    Hemoglobin 12.9 (*)    HCT 38.5 (*)    All other components within normal limits  BASIC METABOLIC PANEL - Abnormal; Notable for the following:    Glucose, Bld 229 (*)    All other components within normal limits  URINALYSIS, ROUTINE W REFLEX MICROSCOPIC (NOT AT [facility])    Imaging Review No results found. I have personally reviewed and evaluated these images and lab results as part of my medical decision-making.   EKG Interpretation None      MDM   Final diagnoses:  Urinary retention  UTI (lower urinary tract infection)  Hematuria   75 year old male with recent TURP c/b UTI p/w hematuria and urinary retention. Dr. Roni Bread at bedside at time I evaluated patient. Placing foley catheter at bedside. Noted significant hematuria. Dr. Roni Bread admitting to urology for observation.     Forde Dandy, MD 06/19/15 [id number]

## 2015-06-19 NOTE — ED Notes (Signed)
Bed: WA21 Expected date:  Expected time:  Means of arrival:  Comments: [place] - Urology called sending pt

## 2015-06-19 NOTE — H&P (Signed)
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  He had been doing well until earlier this week when he began to have a reduced stream with frequency.  He was seen in the office and was felt to have a probable UTI and was started on Keflex.  His culture came back today with e. Coli sensitive to keflex.  Yesterday he called with gross hematuria but he was voiding and the urine cleared overnight but he called again today with recurrent bleeding and retention and was told to come to the ER.  I saw him here and placed a 47fr foley and irrigated out several hundred ml of bloody urine with about 130ml of clots.   I was eventually able to get him clear, but because of the degree of hemorrhage and the mild anemia from blood loss along with the late hour, I felt that overnight observation was indicated.   ROS:  Review of Systems  Genitourinary:       Suprapubic discomfort with bloody urine and inability to urinate.   All other systems reviewed and are negative.   No Known Allergies  Past Medical History  Diagnosis Date  . Hypertension   . Hyperlipidemia   . Diabetes mellitus   . History of shingles   . Colon cancer ([place]) 1992    colon resection 1992  . Sleep apnea     cpap  . Complication of anesthesia     bladder problems after surgery from Anesthesia- can not urinate    Past Surgical History  Procedure Laterality Date  . Colectomy  1992  . Appendectomy  1949  . Hemorrhoid surgery    . Transurethral resection of prostate N/A 05/23/2015    Procedure: TRANSURETHRAL RESECTION OF THE PROSTATE (TURP);  Surgeon: Irine Seal, MD;  Location: [facility];  Service: Urology;  Laterality: N/A;    Social History   Social History  . Marital Status: Married    Spouse Name: N/A  . Number of Children: N/A  . Years of Education: N/A   Occupational History  . Not on file.   Social History Main Topics  . Smoking status: Never Smoker   .  Smokeless tobacco: Never Used     Comment: smoked for less that 6 months in 1960  . Alcohol Use: 1.2 oz/week    2 Cans of beer per week     Comment: 2 cans previously, cut out now to help with diabetes control. rare  . Drug Use: No  . Sexual Activity: Not on file   Other Topics Concern  . Not on file   Social History Narrative   Married 1966. 2 daughters Sharee Pimple divorced lives in [place] works for [employer] no kids and Mateo Flow never married in [place], [place] working for [employer] for [employer] improvement no kids.       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 Cancer      colon/fhx  . Colon cancer Father 77  . Rectal cancer Maternal Uncle   . Colon cancer Paternal Grandmother 97  . Diabetes Father     type I  . Alzheimer's disease Mother     Anti-infectives: Anti-infectives    None  No current facility-administered medications for this encounter.   Current Outpatient Prescriptions  Medication Sig Dispense Refill  . amLODipine (NORVASC) 5 MG tablet TAKE 1 TABLET (5 MG TOTAL) BY MOUTH DAILY. 90 tablet 3  . aspirin 81 MG tablet Take 81 mg by mouth daily.    Marland Kitchen atorvastatin (LIPITOR) 20 MG tablet TAKE 1 TABLET BY MOUTH ONCE PER WEEK (Patient taking differently: TAKE 1 TABLET BY MOUTH ONCE PER WEEK on Sundays) 52 tablet 2  . BETA CAROTENE PO Take by mouth.    . cephALEXin (KEFLEX) 500 MG capsule Take 500 mg by mouth 2 (two) times daily.  0  . CHROMIUM PICOLINATE PO Take 200 mg by mouth daily.    Marland Kitchen CINNAMON PO Take 200 mg by mouth daily.     Marland Kitchen glimepiride (AMARYL) 4 MG tablet Take 1 tablet (4 mg total) by mouth daily before breakfast. 90 tablet 3  . metFORMIN (GLUCOPHAGE) 1000 MG tablet TAKE 1 TABLET (1,000 MG TOTAL) BY MOUTH 2 (TWO) TIMES DAILY WITH A MEAL. 180 tablet 2  .  Multiple Vitamins-Minerals (CENTRUM SILVER PO) Take 1 tablet by mouth daily.     Vladimir Faster Glycol-Propyl Glycol (SYSTANE OP) Apply to eye.    . saw palmetto 160 MG capsule Take 160 mg by mouth daily.     Marland Kitchen telmisartan (MICARDIS) 40 MG tablet TAKE 1 TABLET (40 MG TOTAL) BY MOUTH DAILY. 90 tablet 3  . glucose blood (ONE TOUCH ULTRA TEST) test strip Use as instructed to check blood sugar once a day 100 each 1     Objective: Vital signs in last 24 hours: Temp:  [97.9 F (36.6 C)] 97.9 F (36.6 C) (02/17 2202) Pulse Rate:  [96] 96 (02/17 2202) Resp:  [18] 18 (02/17 2202) BP: (144)/(76) 144/76 mmHg (02/17 2202) SpO2:  [97 %] 97 % (02/17 2202)  Intake/Output from previous day:   Intake/Output this shift:     Physical Exam  Constitutional: He is well-developed, well-nourished, and in no distress.  Cardiovascular: Normal rate and regular rhythm.   Pulmonary/Chest: Effort normal and breath sounds normal. No respiratory distress.  Abdominal: Soft. There is tenderness (suprapubic with some fullness. ).  Genitourinary:  Uncircumcised with blood at the meatus.   Vitals reviewed.   Lab Results:   Recent Labs  06/19/15 2200  WBC 9.7  HGB 12.9*  HCT 38.5*  PLT 223   BMET  Recent Labs  06/19/15 2200  NA 137  K 4.4  CL 102  CO2 24  GLUCOSE 229*  BUN 20  CREATININE 0.98  CALCIUM 9.8   PT/INR No results for input(s): LABPROT, INR in the last 72 hours. ABG No results for input(s): PHART, HCO3 in the last 72 hours.  Invalid input(s): PCO2, PO2  Studies/Results: No results found.  Procedure: A 2fr foley was inserted and the balloon was initially filled with 13ml.  He was then irrigated with 2561ml of saline and irrigated out several hundred ml of bloody urine with clots.  He eventually cleared and the balloon was filled with a total of 71ml.  The foley was placed to drainage.   Assessment: Clot retention post TURP. UTI on culture specific antibiotic.  I am going  to admit him for observation because of the extent of the hemorrhage and will check an H&H in the morning.   I will have him keep the foley until Monday morning.      Akira Adelsberger J 06/19/2015 [phone]

## 2015-06-20 ENCOUNTER — Inpatient Hospital Stay (HOSPITAL_COMMUNITY)
Admission: EM | Admit: 2015-06-20 | Discharge: 2015-06-22 | DRG: 908 | Disposition: A | Discharge status: Home / Self Care | Payer: Medicare Other | Attending: Urology | Admitting: Urology

## 2015-06-20 ENCOUNTER — Encounter (HOSPITAL_COMMUNITY): Payer: Self-pay

## 2015-06-20 ENCOUNTER — Encounter (HOSPITAL_COMMUNITY): Payer: Self-pay | Admitting: Emergency Medicine

## 2015-06-20 DIAGNOSIS — G4730 Sleep apnea, unspecified: Secondary | ICD-10-CM | POA: Diagnosis present

## 2015-06-20 DIAGNOSIS — Z833 Family history of diabetes mellitus: Secondary | ICD-10-CM | POA: Diagnosis not present

## 2015-06-20 DIAGNOSIS — E785 Hyperlipidemia, unspecified: Secondary | ICD-10-CM | POA: Diagnosis present

## 2015-06-20 DIAGNOSIS — Z7984 Long term (current) use of oral hypoglycemic drugs: Secondary | ICD-10-CM | POA: Diagnosis not present

## 2015-06-20 DIAGNOSIS — D500 Iron deficiency anemia secondary to blood loss (chronic): Secondary | ICD-10-CM | POA: Diagnosis present

## 2015-06-20 DIAGNOSIS — Z85038 Personal history of other malignant neoplasm of large intestine: Secondary | ICD-10-CM

## 2015-06-20 DIAGNOSIS — Z7982 Long term (current) use of aspirin: Secondary | ICD-10-CM | POA: Diagnosis not present

## 2015-06-20 DIAGNOSIS — E119 Type 2 diabetes mellitus without complications: Secondary | ICD-10-CM | POA: Diagnosis present

## 2015-06-20 DIAGNOSIS — Y836 Removal of other organ (partial) (total) as the cause of abnormal reaction of the patient, or of later complication, without mention of misadventure at the time of the procedure: Secondary | ICD-10-CM | POA: Diagnosis present

## 2015-06-20 DIAGNOSIS — B9620 Unspecified Escherichia coli [E. coli] as the cause of diseases classified elsewhere: Secondary | ICD-10-CM | POA: Diagnosis present

## 2015-06-20 DIAGNOSIS — N99820 Postprocedural hemorrhage and hematoma of a genitourinary system organ or structure following a genitourinary system procedure: Principal | ICD-10-CM | POA: Diagnosis present

## 2015-06-20 DIAGNOSIS — Z79899 Other long term (current) drug therapy: Secondary | ICD-10-CM

## 2015-06-20 DIAGNOSIS — Z8619 Personal history of other infectious and parasitic diseases: Secondary | ICD-10-CM | POA: Diagnosis not present

## 2015-06-20 DIAGNOSIS — N3289 Other specified disorders of bladder: Secondary | ICD-10-CM | POA: Diagnosis present

## 2015-06-20 DIAGNOSIS — I10 Essential (primary) hypertension: Secondary | ICD-10-CM | POA: Diagnosis present

## 2015-06-20 DIAGNOSIS — Z960 Presence of urogenital implants: Secondary | ICD-10-CM

## 2015-06-20 DIAGNOSIS — N390 Urinary tract infection, site not specified: Secondary | ICD-10-CM | POA: Diagnosis present

## 2015-06-20 DIAGNOSIS — N368 Other specified disorders of urethra: Secondary | ICD-10-CM | POA: Diagnosis present

## 2015-06-20 DIAGNOSIS — Z800 Family history of malignant neoplasm of digestive organs: Secondary | ICD-10-CM | POA: Diagnosis not present

## 2015-06-20 DIAGNOSIS — R310 Gross hematuria: Secondary | ICD-10-CM | POA: Diagnosis present

## 2015-06-20 DIAGNOSIS — Z820 Family history of epilepsy and other diseases of the nervous system: Secondary | ICD-10-CM

## 2015-06-20 DIAGNOSIS — Z978 Presence of other specified devices: Secondary | ICD-10-CM

## 2015-06-20 DIAGNOSIS — R319 Hematuria, unspecified: Secondary | ICD-10-CM

## 2015-06-20 DIAGNOSIS — R338 Other retention of urine: Secondary | ICD-10-CM | POA: Diagnosis present

## 2015-06-20 DIAGNOSIS — Z9079 Acquired absence of other genital organ(s): Secondary | ICD-10-CM

## 2015-06-20 LAB — HEMOGLOBIN AND HEMATOCRIT, BLOOD
HCT: 35.4 % — ABNORMAL LOW (ref 39.0–52.0)
Hemoglobin: 11.7 g/dL — ABNORMAL LOW (ref 13.0–17.0)

## 2015-06-20 LAB — BASIC METABOLIC PANEL
ANION GAP: 9 (ref 5–15)
BUN: 16 mg/dL (ref 6–20)
CHLORIDE: 105 mmol/L (ref 101–111)
CO2: 26 mmol/L (ref 22–32)
Calcium: 9.3 mg/dL (ref 8.9–10.3)
Creatinine, Ser: 0.77 mg/dL (ref 0.61–1.24)
GFR calc Af Amer: >60 mL/min (ref >60)
GFR calc non Af Amer: >60 mL/min (ref >60)
GLUCOSE: 171 mg/dL — AB (ref 65–99)
POTASSIUM: 4.1 mmol/L (ref 3.5–5.1)
Sodium: 140 mmol/L (ref 135–145)

## 2015-06-20 LAB — GLUCOSE, CAPILLARY: Glucose-Capillary: 177 mg/dL — ABNORMAL HIGH (ref 65–99)

## 2015-06-20 MED ORDER — CEPHALEXIN 500 MG PO CAPS: 500.0000 mg | ORAL_CAPSULE | Freq: Two times a day (BID) | ORAL | Status: DC

## 2015-06-20 MED ORDER — BELLADONNA ALKALOIDS-OPIUM 16.2-60 MG RE SUPP: 1.0000 | Freq: Four times a day (QID) | RECTAL | Status: DC | PRN

## 2015-06-20 MED ORDER — CEPHALEXIN 500 MG PO CAPS
500.0000 mg | ORAL_CAPSULE | Freq: Two times a day (BID) | ORAL | Status: DC
  Administered 2015-06-20: 500 mg via ORAL
  Filled 2015-06-20 (×2): qty 1

## 2015-06-20 MED ORDER — POLYETHYL GLYCOL-PROPYL GLYCOL 0.4-0.3 % OP GEL: Freq: Once | OPHTHALMIC | Status: DC

## 2015-06-20 MED ORDER — POLYETHYLENE GLYCOL 3350 17 G PO PACK
17.0000 g | PACK | Freq: Every day | ORAL | Status: DC
  Administered 2015-06-20: 17 g via ORAL

## 2015-06-20 MED ORDER — POLYETHYLENE GLYCOL 3350 17 G PO PACK
17.0000 g | PACK | Freq: Every day | ORAL | Status: DC
  Filled 2015-06-20: qty 1

## 2015-06-20 MED ORDER — AMLODIPINE BESYLATE 5 MG PO TABS
5.0000 mg | ORAL_TABLET | Freq: Every day | ORAL | Status: DC
  Administered 2015-06-20: 5 mg via ORAL
  Filled 2015-06-20: qty 1

## 2015-06-20 MED ORDER — METFORMIN HCL 500 MG PO TABS
1000.0000 mg | ORAL_TABLET | Freq: Every day | ORAL | Status: DC
  Administered 2015-06-20: 1000 mg via ORAL
  Filled 2015-06-20: qty 2

## 2015-06-20 MED ORDER — IRBESARTAN 150 MG PO TABS
150.0000 mg | ORAL_TABLET | Freq: Every day | ORAL | Status: DC
  Administered 2015-06-20: 150 mg via ORAL
  Filled 2015-06-20: qty 1

## 2015-06-20 MED ORDER — PHENAZOPYRIDINE HCL 200 MG PO TABS: 200.0000 mg | ORAL_TABLET | Freq: Three times a day (TID) | ORAL | Status: DC | PRN

## 2015-06-20 MED ORDER — GLIMEPIRIDE 4 MG PO TABS
4.0000 mg | ORAL_TABLET | Freq: Every day | ORAL | Status: DC
  Administered 2015-06-20: 4 mg via ORAL
  Filled 2015-06-20: qty 1

## 2015-06-20 NOTE — ED Provider Notes (Signed)
CSN: [id number]     Arrival date & time 06/20/15  1958 History   First MD Initiated Contact with Patient 06/20/15 2042     Chief Complaint  Patient presents with  . Urinary Retention     (Consider location/radiation/quality/duration/timing/severity/associated sxs/prior Treatment) HPI 75 year old male who presents with blood from around Foley catheter. He is status post TURP on [id number] that was complicated by UTI and subsequent urinary retention from hematuria. He was seen in the ED yesterday and had Foley catheter placed by Dr. Carole Binning from urology. He was admitted overnight as he was having persistent hematuria. Had clearing urine in the morning and was discharged home. States that he had felt well, began to notice some burning around his urethral meatus on the way home in the car. That feeling resolved, but after having a bowel movement began to notice a significant amount of blood that passed from around the urethral meatus and subsequent hematuria through the Foley catheter. He subsequently returned to ED for evaluation per Dr. Ralene Muskrat recommendations. Currently without abdominal pain, N/V, sensation of retention.   Past Medical History  Diagnosis Date  . Hypertension   . Hyperlipidemia   . Diabetes mellitus   . History of shingles   . Colon cancer ([name]) 1992    colon resection 1992  . Sleep apnea     cpap  . Complication of anesthesia     bladder problems after surgery from Anesthesia- can not urinate   Past Surgical History  Procedure Laterality Date  . Colectomy  1992  . Appendectomy  1949  . Hemorrhoid surgery    . Transurethral resection of prostate N/A 05/23/2015    Procedure: TRANSURETHRAL RESECTION OF THE PROSTATE (TURP);  Surgeon: Irine Seal, MD;  Location: [facility];  Service: Urology;  Laterality: N/A;   Family History  Problem Relation Age of Onset  . Cancer      colon/fhx  . Colon cancer Father 78  . Rectal cancer Maternal Uncle   . Colon cancer Paternal  Grandmother 62  . Diabetes Father     type I  . Alzheimer's disease Mother    Social History  Substance Use Topics  . Smoking status: Never Smoker   . Smokeless tobacco: Never Used     Comment: smoked for less that 6 months in 1960  . Alcohol Use: 1.2 oz/week    2 Cans of beer per week     Comment: 2 cans previously, cut out now to help with diabetes control. rare    Review of Systems 10/14 systems reviewed and are negative other than those stated in the HPI    Allergies  Review of patient's allergies indicates no known allergies.  Home Medications   Prior to Admission medications   Medication Sig Start Date End Date Taking? Authorizing Provider  amLODipine (NORVASC) 5 MG tablet TAKE 1 TABLET (5 MG TOTAL) BY MOUTH DAILY. 03/30/15  Yes [name], MD  aspirin 81 MG tablet Take 81 mg by mouth daily.   Yes Historical Provider, MD  atorvastatin (LIPITOR) 20 MG tablet TAKE 1 TABLET BY MOUTH ONCE PER WEEK Patient taking differently: TAKE 1 TABLET BY MOUTH ONCE PER WEEK on Sundays 06/04/15  Yes [name], MD  BETA CAROTENE PO Take by mouth.   Yes Historical Provider, MD  cephALEXin (KEFLEX) 500 MG capsule Take 1 capsule (500 mg total) by mouth 2 (two) times daily. 06/20/15  Yes Carolan Clines, MD  CHROMIUM PICOLINATE PO Take 200 mg by  mouth daily.   Yes Historical Provider, MD  CINNAMON PO Take 200 mg by mouth daily.    Yes Historical Provider, MD  glimepiride (AMARYL) 4 MG tablet Take 1 tablet (4 mg total) by mouth daily before breakfast. 04/09/15  Yes [name], MD  metFORMIN (GLUCOPHAGE) 1000 MG tablet TAKE 1 TABLET (1,000 MG TOTAL) BY MOUTH 2 (TWO) TIMES DAILY WITH A MEAL. 06/04/15  Yes [name], MD  Multiple Vitamins-Minerals (CENTRUM SILVER PO) Take 1 tablet by mouth daily.    Yes Historical Provider, MD  phenazopyridine (PYRIDIUM) 200 MG tablet Take 1 tablet (200 mg total) by mouth 3 (three) times daily as needed for pain. 06/20/15  Yes Carolan Clines, MD  Polyethyl Glycol-Propyl Glycol (SYSTANE OP) Place 1 drop into the left eye daily as needed (lubrication).    Yes Historical Provider, MD  saw palmetto 160 MG capsule Take 160 mg by mouth daily.    Yes Historical Provider, MD  telmisartan (MICARDIS) 40 MG tablet TAKE 1 TABLET (40 MG TOTAL) BY MOUTH DAILY. 03/30/15  Yes [name], MD  glucose blood (ONE TOUCH ULTRA TEST) test strip Use as instructed to check blood sugar once a day 06/15/15   [name], MD   BP 131/71 mmHg  Pulse 95  Temp(Src) 97.8 F (36.6 C) (Oral)  Resp 18  Ht 6' (1.829 m)  Wt 198 lb (89.812 kg)  BMI 26.85 kg/m2  SpO2 98% Physical Exam Physical Exam  Nursing note and vitals reviewed. Constitutional: Well developed, well nourished, non-toxic, and in no acute distress Head: Normocephalic and atraumatic.  Mouth/Throat: Oropharynx is clear and moist.  Neck: Normal range of motion. Neck supple.  Cardiovascular: Normal rate and regular rhythm.   Pulmonary/Chest: Effort normal Abdominal: Soft. There is no tenderness. There is no rebound and no guarding. No CVA tenderness. GU: foley catheter in place with dried blood around the meatus. Draining dark tea colored urine into foley bag Musculoskeletal: Normal range of motion.  Neurological: Alert, no facial droop, fluent speech, moves all extremities symmetrically Skin: Skin is warm and dry.  Psychiatric: Cooperative  ED Course  Procedures (including critical care time) Labs Review Labs Reviewed - No data to display  Imaging Review No results found. I have personally reviewed and evaluated these images and lab results as part of my medical decision-making.   EKG Interpretation None      MDM   Final diagnoses:  Hematuria  Foley catheter in place    75 year old male with history of recent urinary retention secondary to hematuria with Foley catheter who presents with recurrent hematuria and blood around the urethral meatus. No  abdominal pain on exam, with dried blood around meatus. Foley flushed until clear. Dr. Gaynelle Arabian present to evaluate patient. Will admit for potential cystoscopy and cautherization.     Forde Dandy, MD 06/20/15 [phone]

## 2015-06-20 NOTE — Progress Notes (Signed)
Urology Progress Note : Urinary clot retention, post TURP 1 month ago. .  Subjective:     No acute urologic events overnight. Ambulation:   positive Flatus:    positive Bowel movement  negative  Pain: complete resolution  Objective:  Blood pressure 125/64, pulse 77, temperature 98 F (36.7 C), temperature source Oral, resp. rate 18, height 6' (1.829 m), weight 87.6 kg (193 lb 2 oz), SpO2 94 %.  Physical Exam:  General:  No acute distress, awake Extremities: extremities normal, atraumatic, no cyanosis or edema Genitourinary:  Normal penis/scroptum Foley: urine clear    I/O last 3 completed shifts: In: 493.3 [I.V.:493.3] Out: -   Recent Labs     06/19/15  2200  06/20/15  0600  HGB  12.9*  11.7*  WBC  9.7   --   PLT  223   --     Recent Labs     06/19/15  2200  06/20/15  0600  NA  137  140  K  4.4  4.1  CL  102  105  CO2  24  26  BUN  20  16  CREATININE  0.98  0.77  CALCIUM  9.8  9.3  GFRNONAA  >60  >60  GFRAA  >60  >60     No results for input(s): INR, APTT in the last 72 hours.  Invalid input(s): PT   Invalid input(s): ABG  Assessment/Plan:  Catheter not removed. Continue any current medications. Follow up : Monday, Dr. Jeffie Pollock for voiding trial.

## 2015-06-20 NOTE — H&P (Signed)
Subjective: Walter [name] is a 75 yo married male, s/p TURP for BPH, 1 month ago, for postop AUR with BPH, with  BOO and a hypotonic bladder.    One week prior to admission, he began to have a reduced stream with frequency. He was seen in the office and was felt to have a probable UTI and was started on Keflex. His culture grew E. Coli sensitive to keflex, but developed gross hematuria.  His urine cleared overnight but he developed recurrent bleeding and recurrent retention and was told to come to the ER.    A 17fr foley was placed, and irrigated, with several hundred ml of bloody urine with clots.Although he  eventually  ceared,  And because of the mild anemia from blood loss along with the late hour, he was admitted for overnight observation, and allow to be discharged with clear urine. However, after having a BM, he developed severe frank gross hematuria around the foley catheter, and was sent back to the ED. He asks to return to the OR for irrigation snd 2nd look  For cauterization.  ROS:  Review of Systems  Genitourinary:   Suprapubic discomfort with bloody urine and inability to urinate.  All other systems reviewed and are negative.   No Known Allergies  Past Medical History  Diagnosis Date  . Hypertension   . Hyperlipidemia   . Diabetes mellitus   . History of shingles   . Colon cancer ([place]) 1992    colon resection 1992  . Sleep apnea     cpap  . Complication of anesthesia     bladder problems after surgery from Anesthesia- can not urinate    Past Surgical History  Procedure Laterality Date  . Colectomy  1992  . Appendectomy  1949  . Hemorrhoid surgery    . Transurethral resection of prostate N/A 05/23/2015    Procedure: TRANSURETHRAL RESECTION OF THE PROSTATE (TURP); Surgeon: Irine Seal, MD; Location: [facility]; Service: Urology; Laterality: N/A;    Social History   Social History  . Marital Status:  Married    Spouse Name: N/A  . Number of Children: N/A  . Years of Education: N/A   Occupational History  . Not on file.   Social History Main Topics  . Smoking status: Never Smoker   . Smokeless tobacco: Never Used     Comment: smoked for less that 6 months in 1960  . Alcohol Use: 1.2 oz/week    2 Cans of beer per week     Comment: 2 cans previously, cut out now to help with diabetes control. rare  . Drug Use: No  . Sexual Activity: Not on file   Other Topics Concern  . Not on file   Social History Narrative   Married 1966. 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 Cancer      colon/fhx  . Colon cancer Father 80  . Rectal cancer Maternal Uncle   . Colon cancer Paternal Grandmother 46  . Diabetes Father     type I  . Alzheimer's disease Mother  Anti-infectives: Anti-infectives    None      No current facility-administered medications for this encounter.   Current Outpatient Prescriptions  Medication Sig Dispense Refill  . amLODipine (NORVASC) 5 MG tablet TAKE 1 TABLET (5 MG TOTAL) BY MOUTH DAILY. 90 tablet 3  . aspirin 81 MG tablet Take 81 mg by mouth daily.    Marland Kitchen atorvastatin (LIPITOR) 20 MG tablet TAKE 1 TABLET BY MOUTH ONCE PER WEEK (Patient taking differently: TAKE 1 TABLET BY MOUTH ONCE PER WEEK on Sundays) 52 tablet 2  . BETA CAROTENE PO Take by mouth.    . cephALEXin (KEFLEX) 500 MG capsule Take 500 mg by mouth 2 (two) times daily.  0  . CHROMIUM  PICOLINATE PO Take 200 mg by mouth daily.    Marland Kitchen CINNAMON PO Take 200 mg by mouth daily.     Marland Kitchen glimepiride (AMARYL) 4 MG tablet Take 1 tablet (4 mg total) by mouth daily before breakfast. 90 tablet 3  . metFORMIN (GLUCOPHAGE) 1000 MG tablet TAKE 1 TABLET (1,000 MG TOTAL) BY MOUTH 2 (TWO) TIMES DAILY WITH A MEAL. 180 tablet 2  . Multiple Vitamins-Minerals (CENTRUM SILVER PO) Take 1 tablet by mouth daily.     Vladimir Faster Glycol-Propyl Glycol (SYSTANE OP) Apply to eye.    . saw palmetto 160 MG capsule Take 160 mg by mouth daily.     Marland Kitchen telmisartan (MICARDIS) 40 MG tablet TAKE 1 TABLET (40 MG TOTAL) BY MOUTH DAILY. 90 tablet 3  . glucose blood (ONE TOUCH ULTRA TEST) test strip Use as instructed to check blood sugar once a day 100 each 1     Objective: Vital signs in last 24 hours: Temp: [97.9 F (36.6 C)] 97.9 F (36.6 C) (02/17 2202) Pulse Rate: [96] 96 (02/17 2202) Resp: [18] 18 (02/17 2202) BP: (144)/(76) 144/76 mmHg (02/17 2202) SpO2: [97 %] 97 % (02/17 2202)  Intake/Output from previous day:   Intake/Output this shift:     Physical Exam  Constitutional: He is well-developed, well-nourished, and in no distress.  Cardiovascular: Normal rate and regular rhythm.  Pulmonary/Chest: Effort normal and breath sounds normal. No respiratory distress.  Abdominal: Soft. No CVA pain. :  Uncircumcised with blood at the meatus.  Vitals reviewed.   Lab Results:   Recent Labs (last 2 labs)      Recent Labs  06/19/15 2200  WBC 9.7  HGB 12.9*  HCT 38.5*  PLT 223     BMET  Recent Labs (last 2 labs)      Recent Labs  06/19/15 2200  NA 137  K 4.4  CL 102  CO2 24  GLUCOSE 229*  BUN 20  CREATININE 0.98  CALCIUM 9.8     PT/INR        ABG         Studies/Results:  Imaging Results (Last 48 hours)    No results found.    Procedure: A 63fr foley was inserted and the balloon was initially  filled with 2ml. He was then irrigated with 2516ml of saline and irrigated out several hundred ml of bloody urine with clots. He eventually cleared and the balloon was filled with a total of 42ml. The foley was placed to drainage.   Assessment: Clot retention post TURP.     Pt will need cysto, clot evacuation and cauteriuzation in AM.

## 2015-06-20 NOTE — ED Notes (Signed)
Pt presents with discomfort around penis, foley in place, per pt bleeding at meatus and in bag. Pt d/c today from here for retention.

## 2015-06-20 NOTE — ED Notes (Signed)
Pt tolerated foley cath irrigation.  No resistance noted with NS introduction to foley, with immediate return of clear fluid via gravity noted.

## 2015-06-20 NOTE — Progress Notes (Signed)
Patient is alert and oriented x4, ambulatory. Discharge instructions reviewed. Patient encouraged to adhere to plan of care and refer to instructions attached for reminder. Patient will go home with foley additional instructions provided.

## 2015-06-20 NOTE — Discharge Instructions (Signed)
Acute Urinary Retention, Male Acute urinary retention is the temporary inability to urinate. This is a common problem in older men. As men age their prostates become larger and block the flow of urine from the bladder. This is usually a problem that has come on gradually.  HOME CARE INSTRUCTIONS If you are sent home with a Foley catheter and a drainage system, you will need to discuss the best course of action with your health care provider. While the catheter is in, maintain a good intake of fluids. Keep the drainage bag emptied and lower than your catheter. This is so that contaminated urine will not flow back into your bladder, which could lead to a urinary tract infection. There are two main types of drainage bags. One is a large bag that usually is used at night. It has a good capacity that will allow you to sleep through the night without having to empty it. The second type is called a leg bag. It has a smaller capacity, so it needs to be emptied more frequently. However, the main advantage is that it can be attached by a leg strap and can go underneath your clothing, allowing you the freedom to move about or leave your home. Only take over-the-counter or prescription medicines for pain, discomfort, or fever as directed by your health care provider.  SEEK MEDICAL CARE IF:  You develop a low-grade fever.  You experience spasms or leakage of urine with the spasms. SEEK IMMEDIATE MEDICAL CARE IF:   You develop chills or fever.  Your catheter stops draining urine.  Your catheter falls out.  You start to develop increased bleeding that does not respond to rest and increased fluid intake. MAKE SURE YOU:  Understand these instructions.  Will watch your condition.  Will get help right away if you are not doing well or get worse.   This information is not intended to replace advice given to you by your health care provider. Make sure you discuss any questions you have with your health care  provider.   Document Released: 07/25/2000 Document Revised: 09/02/2014 Document Reviewed: 09/27/2012 Elsevier Interactive Patient Education 2016 [place].  Acute Urinary Retention, Male Acute urinary retention is the temporary inability to urinate. This is a common problem in older men. As men age their prostates become larger and block the flow of urine from the bladder. This is usually a problem that has come on gradually.  HOME CARE INSTRUCTIONS If you are sent home with a Foley catheter and a drainage system, you will need to discuss the best course of action with your health care provider. While the catheter is in, maintain a good intake of fluids. Keep the drainage bag emptied and lower than your catheter. This is so that contaminated urine will not flow back into your bladder, which could lead to a urinary tract infection. There are two main types of drainage bags. One is a large bag that usually is used at night. It has a good capacity that will allow you to sleep through the night without having to empty it. The second type is called a leg bag. It has a smaller capacity, so it needs to be emptied more frequently. However, the main advantage is that it can be attached by a leg strap and can go underneath your clothing, allowing you the freedom to move about or leave your home. Only take over-the-counter or prescription medicines for pain, discomfort, or fever as directed by your health care provider.  SEEK  MEDICAL CARE IF:  You develop a low-grade fever.  You experience spasms or leakage of urine with the spasms. SEEK IMMEDIATE MEDICAL CARE IF:   You develop chills or fever.  Your catheter stops draining urine.  Your catheter falls out.  You start to develop increased bleeding that does not respond to rest and increased fluid intake. MAKE SURE YOU:  Understand these instructions.  Will watch your condition.  Will get help right away if you are not doing well or get  worse.   This information is not intended to replace advice given to you by your health care provider. Make sure you discuss any questions you have with your health care provider.   Document Released: 07/25/2000 Document Revised: 09/02/2014 Document Reviewed: 09/27/2012 Elsevier Interactive Patient Education [employer].

## 2015-06-21 ENCOUNTER — Encounter (HOSPITAL_COMMUNITY): Payer: Self-pay | Admitting: Anesthesiology

## 2015-06-21 ENCOUNTER — Inpatient Hospital Stay (HOSPITAL_COMMUNITY): Payer: Medicare Other | Admitting: Certified Registered"

## 2015-06-21 ENCOUNTER — Encounter (HOSPITAL_COMMUNITY)
Admission: EM | Disposition: A | Discharge status: Home / Self Care | Payer: Self-pay | Source: Home / Self Care | Attending: Urology

## 2015-06-21 DIAGNOSIS — N368 Other specified disorders of urethra: Secondary | ICD-10-CM | POA: Diagnosis present

## 2015-06-21 HISTORY — PX: CYSTOSCOPY: SHX5120

## 2015-06-21 LAB — BASIC METABOLIC PANEL
ANION GAP: 9 (ref 5–15)
BUN: 21 mg/dL — ABNORMAL HIGH (ref 6–20)
CO2: 26 mmol/L (ref 22–32)
CREATININE: 0.92 mg/dL (ref 0.61–1.24)
Calcium: 9.1 mg/dL (ref 8.9–10.3)
Chloride: 104 mmol/L (ref 101–111)
GFR calc non Af Amer: >60 mL/min (ref >60)
Glucose, Bld: 152 mg/dL — ABNORMAL HIGH (ref 65–99)
POTASSIUM: 4 mmol/L (ref 3.5–5.1)
Sodium: 139 mmol/L (ref 135–145)

## 2015-06-21 LAB — URINALYSIS, ROUTINE W REFLEX MICROSCOPIC
Bilirubin Urine: NEGATIVE
Glucose, UA: NEGATIVE mg/dL
Ketones, ur: NEGATIVE mg/dL
NITRITE: NEGATIVE
PH: 5.5 (ref 5.0–8.0)
Protein, ur: NEGATIVE mg/dL
SPECIFIC GRAVITY, URINE: 1.007 (ref 1.005–1.030)

## 2015-06-21 LAB — GLUCOSE, CAPILLARY: Glucose-Capillary: 172 mg/dL — ABNORMAL HIGH (ref 65–99)

## 2015-06-21 LAB — CBC
HEMATOCRIT: 32.7 % — AB (ref 39.0–52.0)
Hemoglobin: 10.8 g/dL — ABNORMAL LOW (ref 13.0–17.0)
MCH: 30.3 pg (ref 26.0–34.0)
MCHC: 33 g/dL (ref 30.0–36.0)
MCV: 91.9 fL (ref 78.0–100.0)
PLATELETS: 197 10*3/uL (ref 150–400)
RBC: 3.56 MIL/uL — ABNORMAL LOW (ref 4.22–5.81)
RDW: 14.6 % (ref 11.5–15.5)
WBC: 7.2 10*3/uL (ref 4.0–10.5)

## 2015-06-21 LAB — URINE MICROSCOPIC-ADD ON
BACTERIA UA: NONE SEEN
RBC / HPF: NONE SEEN RBC/hpf (ref 0–5)
SQUAMOUS EPITHELIAL / LPF: NONE SEEN

## 2015-06-21 SURGERY — CYSTOSCOPY
Anesthesia: General | Site: Bladder

## 2015-06-21 MED ORDER — PHENAZOPYRIDINE HCL 200 MG PO TABS
200.0000 mg | ORAL_TABLET | Freq: Three times a day (TID) | ORAL | Status: DC | PRN
  Filled 2015-06-21: qty 1

## 2015-06-21 MED ORDER — ACETAMINOPHEN 10 MG/ML IV SOLN
INTRAVENOUS | Status: DC | PRN
  Administered 2015-06-21: 1000 mg via INTRAVENOUS

## 2015-06-21 MED ORDER — BELLADONNA ALKALOIDS-OPIUM 16.2-60 MG RE SUPP
RECTAL | Status: AC
  Filled 2015-06-21: qty 1

## 2015-06-21 MED ORDER — PROPOFOL 10 MG/ML IV BOLUS
INTRAVENOUS | Status: AC
  Filled 2015-06-21: qty 20

## 2015-06-21 MED ORDER — CEPHALEXIN 500 MG PO CAPS: 500.0000 mg | ORAL_CAPSULE | Freq: Three times a day (TID) | ORAL | Status: DC

## 2015-06-21 MED ORDER — MAGNESIUM CITRATE PO SOLN: 1.0000 | Freq: Once | ORAL | Status: DC | PRN

## 2015-06-21 MED ORDER — METFORMIN HCL 500 MG PO TABS
1000.0000 mg | ORAL_TABLET | Freq: Every day | ORAL | Status: DC
  Administered 2015-06-22: 1000 mg via ORAL
  Filled 2015-06-21: qty 2

## 2015-06-21 MED ORDER — OXYBUTYNIN CHLORIDE 5 MG PO TABS: 5.0000 mg | ORAL_TABLET | Freq: Three times a day (TID) | ORAL | Status: DC | PRN

## 2015-06-21 MED ORDER — DIPHENHYDRAMINE HCL 50 MG/ML IJ SOLN: 12.5000 mg | Freq: Four times a day (QID) | INTRAMUSCULAR | Status: DC | PRN

## 2015-06-21 MED ORDER — ONDANSETRON HCL 4 MG/2ML IJ SOLN
4.0000 mg | INTRAMUSCULAR | Status: DC | PRN
Start: 2015-06-21 — End: 2015-06-21
  Administered 2015-06-21: 4 mg via INTRAVENOUS

## 2015-06-21 MED ORDER — PROSIGHT PO TABS
1.0000 | ORAL_TABLET | Freq: Every day | ORAL | Status: DC
  Administered 2015-06-21 – 2015-06-22 (×2): 1 via ORAL
  Filled 2015-06-21 (×2): qty 1

## 2015-06-21 MED ORDER — LIDOCAINE HCL (CARDIAC) 20 MG/ML IV SOLN
INTRAVENOUS | Status: DC | PRN
  Administered 2015-06-21: 50 mg via INTRAVENOUS

## 2015-06-21 MED ORDER — PHENYLEPHRINE 40 MCG/ML (10ML) SYRINGE FOR IV PUSH (FOR BLOOD PRESSURE SUPPORT)
PREFILLED_SYRINGE | INTRAVENOUS | Status: AC
  Filled 2015-06-21: qty 10

## 2015-06-21 MED ORDER — ACETAMINOPHEN 325 MG PO TABS: 650.0000 mg | ORAL_TABLET | ORAL | Status: DC | PRN

## 2015-06-21 MED ORDER — SODIUM CHLORIDE 0.9 % IR SOLN
Status: DC | PRN
  Administered 2015-06-21: 3000 mL

## 2015-06-21 MED ORDER — KETOROLAC TROMETHAMINE 30 MG/ML IJ SOLN
INTRAMUSCULAR | Status: DC | PRN
  Administered 2015-06-21: 30 mg via INTRAVENOUS

## 2015-06-21 MED ORDER — OXYCODONE-ACETAMINOPHEN 5-325 MG PO TABS: 1.0000 | ORAL_TABLET | ORAL | Status: DC | PRN

## 2015-06-21 MED ORDER — DEXTROSE 5 % IV SOLN
1.0000 g | INTRAVENOUS | Status: AC
  Administered 2015-06-21: 1 g via INTRAVENOUS
  Filled 2015-06-21: qty 10

## 2015-06-21 MED ORDER — BETA CAROTENE 15 MG PO CAPS: 15.0000 mg | ORAL_CAPSULE | Freq: Every morning | ORAL | Status: DC

## 2015-06-21 MED ORDER — SODIUM CHLORIDE 0.45 % IV SOLN
INTRAVENOUS | Status: DC
  Administered 2015-06-21: 13:00:00 via INTRAVENOUS

## 2015-06-21 MED ORDER — ZOLPIDEM TARTRATE 5 MG PO TABS: 5.0000 mg | ORAL_TABLET | Freq: Every evening | ORAL | Status: DC | PRN

## 2015-06-21 MED ORDER — AMLODIPINE BESYLATE 5 MG PO TABS
5.0000 mg | ORAL_TABLET | Freq: Every day | ORAL | Status: DC
  Administered 2015-06-21 – 2015-06-22 (×2): 5 mg via ORAL
  Filled 2015-06-21 (×2): qty 1

## 2015-06-21 MED ORDER — FENTANYL CITRATE (PF) 100 MCG/2ML IJ SOLN
INTRAMUSCULAR | Status: AC
  Filled 2015-06-21: qty 2

## 2015-06-21 MED ORDER — CEPHALEXIN 500 MG PO CAPS
500.0000 mg | ORAL_CAPSULE | Freq: Two times a day (BID) | ORAL | Status: DC
  Administered 2015-06-21 – 2015-06-22 (×2): 500 mg via ORAL
  Filled 2015-06-21 (×2): qty 1

## 2015-06-21 MED ORDER — FENTANYL CITRATE (PF) 100 MCG/2ML IJ SOLN
INTRAMUSCULAR | Status: DC | PRN
  Administered 2015-06-21 (×2): 25 ug via INTRAVENOUS
  Administered 2015-06-21: 50 ug via INTRAVENOUS

## 2015-06-21 MED ORDER — LIDOCAINE HCL 2 % EX GEL
CUTANEOUS | Status: AC
  Filled 2015-06-21: qty 5

## 2015-06-21 MED ORDER — DIPHENHYDRAMINE HCL 12.5 MG/5ML PO ELIX: 12.5000 mg | ORAL_SOLUTION | Freq: Four times a day (QID) | ORAL | Status: DC | PRN

## 2015-06-21 MED ORDER — FENTANYL CITRATE (PF) 100 MCG/2ML IJ SOLN: 25.0000 ug | INTRAMUSCULAR | Status: DC | PRN

## 2015-06-21 MED ORDER — SODIUM CHLORIDE 0.45 % IV SOLN
INTRAVENOUS | Status: DC
  Administered 2015-06-21: 01:00:00 via INTRAVENOUS

## 2015-06-21 MED ORDER — POLYETHYL GLYCOL-PROPYL GLYCOL 0.4-0.3 % OP GEL: Freq: Every day | OPHTHALMIC | Status: DC | PRN

## 2015-06-21 MED ORDER — ACETAMINOPHEN 10 MG/ML IV SOLN
INTRAVENOUS | Status: AC
  Filled 2015-06-21: qty 100

## 2015-06-21 MED ORDER — ONDANSETRON HCL 4 MG/2ML IJ SOLN
INTRAMUSCULAR | Status: AC
  Filled 2015-06-21: qty 2

## 2015-06-21 MED ORDER — ACETAMINOPHEN 500 MG PO TABS: 1000.0000 mg | ORAL_TABLET | ORAL | Status: DC

## 2015-06-21 MED ORDER — KETOROLAC TROMETHAMINE 30 MG/ML IJ SOLN
INTRAMUSCULAR | Status: AC
  Filled 2015-06-21: qty 1

## 2015-06-21 MED ORDER — BACITRACIN-NEOMYCIN-POLYMYXIN 400-5-5000 EX OINT: 1.0000 "application " | TOPICAL_OINTMENT | Freq: Three times a day (TID) | CUTANEOUS | Status: DC | PRN

## 2015-06-21 MED ORDER — GLIMEPIRIDE 4 MG PO TABS
4.0000 mg | ORAL_TABLET | Freq: Every day | ORAL | Status: DC
  Administered 2015-06-22: 4 mg via ORAL
  Filled 2015-06-21 (×2): qty 1

## 2015-06-21 MED ORDER — PHENYLEPHRINE HCL 10 MG/ML IJ SOLN
INTRAMUSCULAR | Status: DC | PRN
  Administered 2015-06-21 (×2): 80 ug via INTRAVENOUS

## 2015-06-21 MED ORDER — BISACODYL 10 MG RE SUPP: 10.0000 mg | Freq: Every day | RECTAL | Status: DC | PRN

## 2015-06-21 MED ORDER — LIDOCAINE HCL (CARDIAC) 20 MG/ML IV SOLN
INTRAVENOUS | Status: AC
  Filled 2015-06-21: qty 5

## 2015-06-21 MED ORDER — IRBESARTAN 75 MG PO TABS
37.5000 mg | ORAL_TABLET | Freq: Every day | ORAL | Status: DC
  Administered 2015-06-21 – 2015-06-22 (×2): 37.5 mg via ORAL
  Filled 2015-06-21 (×2): qty 0.5

## 2015-06-21 MED ORDER — DEXTROSE 5 % IV SOLN
INTRAVENOUS | Status: AC
  Filled 2015-06-21: qty 2

## 2015-06-21 MED ORDER — CHROMIUM PICOLINATE 200 MCG PO TABS: 200000.0000 ug | ORAL_TABLET | Freq: Every day | ORAL | Status: DC

## 2015-06-21 MED ORDER — POLYVINYL ALCOHOL 1.4 % OP SOLN
1.0000 [drp] | Freq: Every day | OPHTHALMIC | Status: DC | PRN
  Filled 2015-06-21: qty 15

## 2015-06-21 MED ORDER — PROPOFOL 10 MG/ML IV BOLUS
INTRAVENOUS | Status: DC | PRN
  Administered 2015-06-21: 200 mg via INTRAVENOUS

## 2015-06-21 MED ORDER — LACTATED RINGERS IV SOLN
INTRAVENOUS | Status: DC | PRN
  Administered 2015-06-21: 10:00:00 via INTRAVENOUS

## 2015-06-21 SURGICAL SUPPLY — 14 items
BAG URINE DRAINAGE (UROLOGICAL SUPPLIES) IMPLANT
BAG URO CATCHER STRL LF (MISCELLANEOUS) ×2 IMPLANT
CATH HEMA 3WAY 30CC 22FR COUDE (CATHETERS) ×2 IMPLANT
CLOTH BEACON ORANGE TIMEOUT ST (SAFETY) ×2 IMPLANT
GLOVE BIOGEL M STRL SZ7.5 (GLOVE) ×2 IMPLANT
GOWN STRL REUS W/TWL LRG LVL3 (GOWN DISPOSABLE) ×2 IMPLANT
GOWN STRL REUS W/TWL XL LVL3 (GOWN DISPOSABLE) ×2 IMPLANT
HOLDER FOLEY CATH W/STRAP (MISCELLANEOUS) IMPLANT
LOOP CUT BIPOLAR 24F LRG (ELECTROSURGICAL) IMPLANT
MANIFOLD NEPTUNE II (INSTRUMENTS) ×2 IMPLANT
PACK CYSTO (CUSTOM PROCEDURE TRAY) ×2 IMPLANT
SYR 30ML LL (SYRINGE) IMPLANT
SYRINGE IRR TOOMEY STRL 70CC (SYRINGE) ×2 IMPLANT
TUBING CONNECTING 10 (TUBING) ×4 IMPLANT

## 2015-06-21 NOTE — Anesthesia Preprocedure Evaluation (Signed)
Anesthesia Evaluation  Patient identified by MRN, date of birth, ID band Patient awake    Reviewed: Allergy & Precautions, H&P , NPO status , Patient's Chart, lab work & pertinent test results, reviewed documented beta blocker date and time   History of Anesthesia Complications (+) history of anesthetic complications  Airway Mallampati: II  TM Distance: >3 FB Neck ROM: Full    Dental no notable dental hx.    Pulmonary sleep apnea , pneumonia,    Pulmonary exam normal breath sounds clear to auscultation       Cardiovascular hypertension, Pt. on medications Normal cardiovascular exam+ dysrhythmias  Rhythm:Regular Rate:Normal     Neuro/Psych  Neuromuscular disease negative psych ROS   GI/Hepatic negative GI ROS, Neg liver ROS,   Endo/Other  diabetes, Type 2, Oral Hypoglycemic Agents  Renal/GU Renal disease  negative genitourinary   Musculoskeletal negative musculoskeletal ROS (+)   Abdominal   Peds negative pediatric ROS (+)  Hematology negative hematology ROS (+)   Anesthesia Other Findings   Reproductive/Obstetrics negative OB ROS                             Anesthesia Physical  Anesthesia Plan  ASA: III  Anesthesia Plan: General   Post-op Pain Management:    Induction: Intravenous  Airway Management Planned: LMA  Additional Equipment:   Intra-op Plan:   Post-operative Plan: Extubation in OR  Informed Consent: I have reviewed the patients History and Physical, chart, labs and discussed the procedure including the risks, benefits and alternatives for the proposed anesthesia with the patient or authorized representative who has indicated his/her understanding and acceptance.   Dental advisory given  Plan Discussed with: CRNA  Anesthesia Plan Comments:         Anesthesia Quick Evaluation

## 2015-06-21 NOTE — Progress Notes (Signed)
PHARMACIST - PHYSICIAN ORDER COMMUNICATION  CONCERNING: P&T Medication Policy on Herbal Medications  DESCRIPTION:  This patient's order for:  Chromium Picolinate has been noted.  This product(s) is classified as an "herbal" or natural product. Due to a lack of definitive safety studies or FDA approval, nonstandard manufacturing practices, plus the potential risk of unknown drug-drug interactions while on inpatient medications, the Pharmacy and Therapeutics Committee does not permit the use of "herbal" or natural products of this type within [facility].   ACTION TAKEN: The pharmacy department is unable to verify this order at this time and your patient has been informed of this safety policy. Please reevaluate patient's clinical condition at discharge and address if the herbal or natural product(s) should be resumed at that time.  Netta Cedars, PharmD, BCPS Pager: [phone] 06/21/2015@12 :48 PM

## 2015-06-21 NOTE — Transfer of Care (Signed)
Immediate Anesthesia Transfer of Care Note  Patient: Walter [name]  Procedure(s) Performed: Procedure(s): CYSTOSCOPY, CLOT EVACUATION, AND CAUTERIZATION OF PROSTATE FOSSA (N/A)  Patient Location: PACU  Anesthesia Type:General  Level of Consciousness:  sedated, patient cooperative and responds to stimulation  Airway & Oxygen Therapy:Patient Spontanous Breathing and Patient connected to face mask oxgen  Post-op Assessment:  Report given to PACU RN and Post -op Vital signs reviewed and stable  Post vital signs:  Reviewed and stable  Last Vitals:  Filed Vitals:   06/21/15 0000 06/21/15 0512  BP: 124/67 100/62  Pulse: 74 74  Temp: 36.6 C 36.6 C  Resp: 18 18    Complications: No apparent anesthesia complications

## 2015-06-21 NOTE — Interval H&P Note (Signed)
History and Physical Interval Note:  06/21/2015 11:24 AM  Walter [name]  has presented today for surgery, with the diagnosis of gross hematuria, clot retention post TURP  The various methods of treatment have been discussed with the patient and family. After consideration of risks, benefits and other options for treatment, the patient has consented to  Procedure(s): CYSTOSCOPY, CLOT EVACUATION, AND CAUTERIZATION OF PROSTATE FOSSA (N/A) as a surgical intervention .  The patient's history has been reviewed, patient examined, no change in status, stable for surgery.  I have reviewed the patient's chart and labs.  Questions were answered to the patient's satisfaction.     [name]

## 2015-06-21 NOTE — Op Note (Signed)
Pre-operative diagnosis :  Post TURP bleeding  Postoperative diagnosis:  Same  Operation:  Cystoscopy, clot evacuation, cauterization of the prostatic fossa  Surgeon:  S. Gaynelle Arabian, MD  First assistant:  none  Anesthesia: General LMA  Preparation: After appropriate preanesthesia, the patient was brought the operating room, placed in the operative table in dorsal supine position where general LMA anesthesia was introduced. He was replaced in the dorsal lithotomy position with the pubis was prepped with Betadine solution and draped in usual fashion. History was reviewed. Our band was double checked.  Review history:75 yo married male, s/p TURP for BPH, 1 month ago, for postop AUR with BPH, with BOO and a hypotonic bladder.  One week prior to admission, he began to have a reduced stream with frequency. He was seen in the office and was felt to have a probable UTI and was started on Keflex. His culture grew E. Coli sensitive to keflex, but developed gross hematuria. His urine cleared overnight but he developed recurrent bleeding and recurrent retention and was told to come to the ER.  A 2fr foley was placed, and irrigated, with several hundred ml of bloody urine with clots.Although he eventually ceared, And because of the mild anemia from blood loss along with the late hour, he was admitted for overnight observation, and allow to be discharged with clear urine. However, after having a BM, he developed severe frank gross hematuria around the foley catheter, and was sent back to the ED. He asks to return to the OR for irrigation snd 2nd look For cauterization  Statement of  Likelihood of Success: Excellent. TIME-OUT observed.:  Procedure: Cystourethroscopy, as, showing normal-appearing urethra. The prostate was well resected. The prostate was approximate 90% healed from previous resection. There were several areas that had not completely healed at 4 weeks post surgery. These areas  were extensively evaluated, and extensive cauterization was accomplished at the 7 and 5:00 positions, the-the locations of larger blood vessels, and also at 12:00. Areas of tissue were removed, that appeared to be necrotic, and cauterization was accomplished beneath these areas. All tissue was evacuated free from the bladder, for a laboratory evaluation. The tissue was appeared to be necrotic.  A 22 Foley catheter was placed, three-way, without any bleeding noted. 30 mL replaced the balloon. The patient is awake and taken recovery in good condition. I do not think this tissue needs to be sent to laboratory.

## 2015-06-21 NOTE — Anesthesia Postprocedure Evaluation (Signed)
Anesthesia Post Note  Patient: Walter [name]  Procedure(s) Performed: Procedure(s) (LRB): CYSTOSCOPY, CLOT EVACUATION, AND CAUTERIZATION OF PROSTATE FOSSA (N/A)  Patient location during evaluation: PACU Anesthesia Type: General Level of consciousness: sedated Pain management: satisfactory to patient Vital Signs Assessment: post-procedure vital signs reviewed and stable Respiratory status: spontaneous breathing Cardiovascular status: stable Anesthetic complications: no    Last Vitals:  Filed Vitals:   06/21/15 1149 06/21/15 1151  BP:  106/69  Pulse: 78 77  Temp:    Resp: 14     Last Pain:  Filed Vitals:   06/21/15 1153  PainSc: 3                  Taura Lamarre EDWARD

## 2015-06-22 ENCOUNTER — Encounter (HOSPITAL_COMMUNITY): Payer: Self-pay | Admitting: Urology

## 2015-06-22 LAB — BASIC METABOLIC PANEL
ANION GAP: 6 (ref 5–15)
BUN: 16 mg/dL (ref 6–20)
CHLORIDE: 109 mmol/L (ref 101–111)
CO2: 27 mmol/L (ref 22–32)
Calcium: 9.2 mg/dL (ref 8.9–10.3)
Creatinine, Ser: 0.95 mg/dL (ref 0.61–1.24)
GFR calc non Af Amer: >60 mL/min (ref >60)
GLUCOSE: 192 mg/dL — AB (ref 65–99)
Potassium: 4.5 mmol/L (ref 3.5–5.1)
Sodium: 142 mmol/L (ref 135–145)

## 2015-06-22 LAB — GLUCOSE, CAPILLARY: Glucose-Capillary: 182 mg/dL — ABNORMAL HIGH (ref 65–99)

## 2015-06-22 LAB — HEMOGLOBIN AND HEMATOCRIT, BLOOD
HCT: 33.7 % — ABNORMAL LOW (ref 39.0–52.0)
Hemoglobin: 11.2 g/dL — ABNORMAL LOW (ref 13.0–17.0)

## 2015-06-22 NOTE — Discharge Instructions (Signed)
Transurethral Resection of the Prostate, Care After °Refer to this sheet in the next few weeks. These instructions provide you with information on caring for yourself after your procedure. Your caregiver also may give you specific instructions. Your treatment has been planned according to current medical practices, but complications sometimes occur. Call your caregiver if you have any problems or questions after your procedure. °HOME CARE INSTRUCTIONS  °Recovery can take 4-6 weeks. Avoid alcohol, caffeinated drinks, and spicy foods for 2 weeks after your procedure. Drink enough fluids to keep your urine clear or pale yellow. Urinate as soon as you feel the urge to do so. Do not try to hold your urine for long periods of time. °During recovery you may experience pain caused by bladder spasms, which result in a very intense urge to urinate. Take all medicines as directed by your caregiver, including medicines for pain. Try to limit the amount of pain medicines you take because it can cause constipation. If you do become constipated, do not strain to move your bowels. Straining can increase bleeding. Constipation can be minimized by increasing the amount fluids and fiber in your diet. Your caregiver also may prescribe a stool softener. °Do not lift heavy objects (more than 5 lb [2.25 kg]) or perform exercises that cause you to strain for at least 1 month after your procedure. When sitting, you may want to sit in a soft chair or use a cushion. For the first 10 days after your procedure, avoid the following activities: °· Running. °· Strenuous work. °· Long walks. °· Riding in a car for extended periods. °· Sex. °SEEK MEDICAL CARE IF: °· You have difficulty urinating. °· You have blood in your urine that does not go away after you rest or increase your fluid intake. °· You have swelling in your penis or scrotum. °SEEK IMMEDIATE MEDICAL CARE IF:  °· You are suddenly unable to urinate. °· You notice blood clots in your  urine. °· You have chills. °· You have a fever. °· You have pain in your back or lower abdomen. °· You have pain or swelling in your legs. °MAKE SURE YOU:  °· Understand these instructions. °· Will watch your condition. °· Will get help right away if you are not doing well or get worse. °  °This information is not intended to replace advice given to you by your health care provider. Make sure you discuss any questions you have with your health care provider. °  °Document Released: 04/18/2005 Document Revised: 05/09/2014 Document Reviewed: 05/27/2011 °Elsevier Interactive Patient Education ©2016 [employer] Inc[employer] ° °

## 2015-06-22 NOTE — Progress Notes (Signed)
Initial Nutrition Assessment  DOCUMENTATION CODES:   Not applicable  INTERVENTION:  - Diet advancement as medically feasible - RD will continue to monitor for needs  NUTRITION DIAGNOSIS:   Inadequate oral intake related to inability to eat as evidenced by NPO status  GOAL:   Patient will meet greater than or equal to 90% of their needs  MONITOR:   Diet advancement, PO intake, Weight trends, Labs, Skin, I & O's  REASON FOR ASSESSMENT:   Malnutrition Screening Tool  ASSESSMENT:   75 yo married male, s/p TURP for BPH, 1 month ago, for postop AUR with BPH, with BOO and a hypotonic bladder.  Pt seen for MST. BMI indicates overweight status. Pt is currently NPO from procedure yesterday; POD #1 cystopscopy, clot evacuation, and cauterization of prostate fossa. Pt was on Regular diet yesterday (2/19) and consumed 100% of both lunch and dinner.  Pt working with RN at time of visit and reports inability to talk at this time. RD offers to return later and pt reports he is going home today; no orders or summary at this time. Pt unable to meet needs currently with being NPO. Unable to complete physical assessment at this time. Per chart review, pt has lost 18 lbs (8% body weight) in the past 3 months which is significant for time frame. Unable to state malnutrition at this time without second parameter.   Medications reviewed. Labs reviewed.    Diet Order:  Diet regular Room service appropriate?: Yes; Fluid consistency:: Thin  Skin:  Wound (see comment) (Perineum incision from TURP 06/21/15)  Last BM:  2/18  Height:   Ht Readings from Last 1 Encounters:  06/20/15 6' (1.829 m)    Weight:   Wt Readings from Last 1 Encounters:  06/20/15 198 lb (89.812 kg)    Ideal Body Weight:  80.91 kg (kg)  BMI:  Body mass index is 26.85 kg/(m^2).  Estimated Nutritional Needs:   Kcal:  1800-2000  Protein:  90-100 grams  Fluid:  >/= 2 L/day  EDUCATION NEEDS:   No education needs  identified at this time     Jarome Matin, RD, LDN Inpatient Clinical Dietitian Pager # [phone] After hours/weekend pager # [phone]

## 2015-06-23 NOTE — Discharge Summary (Signed)
Physician Discharge Summary  Patient ID: Walter [name] MRN: [id number] DOB/AGE: [birth year] 75 y.o.  Admit date: 06/19/2015 Discharge date: 06/23/2015  Admission Diagnoses:  Clot retention of urine  Discharge Diagnoses:  Principal Problem:   Clot retention of urine   Past Medical History  Diagnosis Date  . Hypertension   . Hyperlipidemia   . Diabetes mellitus   . History of shingles   . Colon cancer ([place]) 1992    colon resection 1992  . Sleep apnea     cpap  . Complication of anesthesia     bladder problems after surgery from Anesthesia- can not urinate    Surgeries:  on * No surgery found *   Consultants (if any):    Discharged Condition: Improved  Hospital Course: Walter [name] is an 75 y.o. male who was admitted 06/19/2015 with a diagnosis of Clot retention of urine and had extensive clot evaculation in the ER with eventual clearing of the urine.  He had a TURP about a month before after a 2 month episode with a foley for retention following a hemorrhoidectomy.   He was discharged home with a foley the morning of the 18th doing well.Marland Kitchen    He was given perioperative antibiotics:  Anti-infectives    Start     Dose/Rate Route Frequency Ordered Stop   06/20/15 0029  cephALEXin (KEFLEX) capsule 500 mg  Status:  Discontinued     500 mg Oral 2 times daily 06/20/15 0029 06/20/15 1437   06/20/15 0000  cephALEXin (KEFLEX) 500 MG capsule     500 mg Oral 2 times daily 06/20/15 0956      .  He was given sequential compression devices for DVT prophylaxis.  He benefited maximally from the hospital stay and there were no complications.    Recent vital signs:  Filed Vitals:   06/20/15 0547 06/20/15 1100  BP: 125/64 112/62  Pulse: 77   Temp: 98 F (36.7 C)   Resp: 18     Recent laboratory studies:  Lab Results  Component Value Date   HGB 11.2* 06/22/2015   HGB 10.8* 06/21/2015   HGB 11.7* 06/20/2015   Lab Results  Component Value Date   WBC 7.2 06/21/2015   PLT 197  06/21/2015   No results found for: INR Lab Results  Component Value Date   NA 142 06/22/2015   K 4.5 06/22/2015   CL 109 06/22/2015   CO2 27 06/22/2015   BUN 16 06/22/2015   CREATININE 0.95 06/22/2015   GLUCOSE 192* 06/22/2015    Discharge Medications:     Medication List    TAKE these medications        amLODipine 5 MG tablet  Commonly known as:  NORVASC  TAKE 1 TABLET (5 MG TOTAL) BY MOUTH DAILY.     aspirin 81 MG tablet  Take 81 mg by mouth daily.     atorvastatin 20 MG tablet  Commonly known as:  LIPITOR  TAKE 1 TABLET BY MOUTH ONCE PER WEEK     BETA CAROTENE PO  Take by mouth.     CENTRUM SILVER PO  Take 1 tablet by mouth daily.     cephALEXin 500 MG capsule  Commonly known as:  KEFLEX  Take 1 capsule (500 mg total) by mouth 2 (two) times daily.     CHROMIUM PICOLINATE PO  Take 200 mg by mouth daily.     CINNAMON PO  Take 200 mg by mouth daily.     glimepiride  4 MG tablet  Commonly known as:  AMARYL  Take 1 tablet (4 mg total) by mouth daily before breakfast.     glucose blood test strip  Commonly known as:  ONE TOUCH ULTRA TEST  Use as instructed to check blood sugar once a day     metFORMIN 1000 MG tablet  Commonly known as:  GLUCOPHAGE  TAKE 1 TABLET (1,000 MG TOTAL) BY MOUTH 2 (TWO) TIMES DAILY WITH A MEAL.     phenazopyridine 200 MG tablet  Commonly known as:  PYRIDIUM  Take 1 tablet (200 mg total) by mouth 3 (three) times daily as needed for pain.     saw palmetto 160 MG capsule  Take 160 mg by mouth daily.     SYSTANE OP  Place 1 drop into the left eye daily as needed (lubrication).     telmisartan 40 MG tablet  Commonly known as:  MICARDIS  TAKE 1 TABLET (40 MG TOTAL) BY MOUTH DAILY.        Diagnostic Studies: No results found.  Disposition: 01-Home or Self Care      Discharge Instructions    Continue foley catheter    Complete by:  As directed      Discharge patient    Complete by:  As directed      Discontinue IV     Complete by:  As directed            Follow-up Information    Follow up with [facility]PT.   Specialty:  Emergency Medicine   Why:  If symptoms worsen   Contact information:   [address] [id number] [place] [phone]      Schedule an appointment as soon as possible for a visit with Malka So, MD.   Specialty:  Urology   Contact information:   [address] [address] [phone]       Follow up with Malka So, MD In 2 days.   Specialty:  Urology   Why:  voiding trial   Contact information:   [place] [address] [phone]        Signed: Malka So 06/23/2015, 9:05 AM

## 2015-06-24 NOTE — Discharge Summary (Signed)
Physician Discharge Summary  Patient ID: Walter [name] MRN: [id number] DOB/AGE: 1940/07/09 75 y.o.  Admit date: 06/20/2015 Discharge date: 06/24/2015  Admission Diagnoses:  Clot retention of urine  Discharge Diagnoses:  Principal Problem:   Clot retention of urine Active Problems:   Bleeding from the urethra   Bleeding from urethra in male   Past Medical History  Diagnosis Date  . Hypertension   . Hyperlipidemia   . Diabetes mellitus   . History of shingles   . Colon cancer ([place]) 1992    colon resection 1992  . Sleep apnea     cpap  . Complication of anesthesia     bladder problems after surgery from Anesthesia- can not urinate    Surgeries: Procedure(s): CYSTOSCOPY, CLOT EVACUATION, AND CAUTERIZATION OF PROSTATE FOSSA on 06/20/2015 - 06/21/2015   Consultants (if any): Treatment Team:  Carolan Clines, MD  Discharged Condition: Improved  Hospital Course: Walter [name] is an 75 y.o. male who was admitted 06/20/2015 with a diagnosis of Clot retention of urine.  He had been discharged that morning with a foley after having a clot evacuation in the ER on 2/17 and had clear urine the morning of the 18th.  He returned that afternoon with recurrent bleeding which cleared overnight but because of the multiple episodes of bleeding, he requested a return to the OR for assessment and fulguration if bleeders found. He went to the operating room on  06/21/2015 and underwent the above named procedures.  Active bleeding wasn't found be there were a few areas that were felt to be at risk and they were recauterized.  His urine remained clear and the foley was removed on 2/20 and he was discharged home when voiding.     He was given perioperative antibiotics:      Anti-infectives    Start     Dose/Rate Route Frequency Ordered Stop   06/21/15 2200  cephALEXin (KEFLEX) capsule 500 mg  Status:  Discontinued     500 mg Oral 2 times daily 06/21/15 1237 06/22/15 1539   06/21/15 1400  cephALEXin  (KEFLEX) capsule 500 mg  Status:  Discontinued     500 mg Oral 3 times per day 06/21/15 1237 06/21/15 1243   06/21/15 0600  cefTRIAXone (ROCEPHIN) 1 g in dextrose 5 % 50 mL IVPB     1 g 100 mL/hr over 30 Minutes Intravenous 30 min pre-op 06/21/15 0025 06/21/15 1051    .  He was given sequential compression devices for DVT prophylaxis.  He benefited maximally from the hospital stay and there were no complications.    Recent vital signs:  Filed Vitals:   06/22/15 0220 06/22/15 0451  BP: 116/63 115/89  Pulse: 75 72  Temp: 98.4 F (36.9 C) 98.1 F (36.7 C)  Resp: 16 16    Recent laboratory studies:  Lab Results  Component Value Date   HGB 11.2* 06/22/2015   HGB 10.8* 06/21/2015   HGB 11.7* 06/20/2015   Lab Results  Component Value Date   WBC 7.2 06/21/2015   PLT 197 06/21/2015   No results found for: INR Lab Results  Component Value Date   NA 142 06/22/2015   K 4.5 06/22/2015   CL 109 06/22/2015   CO2 27 06/22/2015   BUN 16 06/22/2015   CREATININE 0.95 06/22/2015   GLUCOSE 192* 06/22/2015    Discharge Medications:     Medication List    TAKE these medications        amLODipine 5 MG  tablet  Commonly known as:  NORVASC  TAKE 1 TABLET (5 MG TOTAL) BY MOUTH DAILY.     aspirin 81 MG tablet  Take 81 mg by mouth daily.     atorvastatin 20 MG tablet  Commonly known as:  LIPITOR  TAKE 1 TABLET BY MOUTH ONCE PER WEEK     BETA CAROTENE PO  Take by mouth.     CENTRUM SILVER PO  Take 1 tablet by mouth daily.     cephALEXin 500 MG capsule  Commonly known as:  KEFLEX  Take 1 capsule (500 mg total) by mouth 2 (two) times daily.     CHROMIUM PICOLINATE PO  Take 200 mg by mouth daily.     CINNAMON PO  Take 200 mg by mouth daily.     glimepiride 4 MG tablet  Commonly known as:  AMARYL  Take 1 tablet (4 mg total) by mouth daily before breakfast.     glucose blood test strip  Commonly known as:  ONE TOUCH ULTRA TEST  Use as instructed to check blood sugar  once a day     metFORMIN 1000 MG tablet  Commonly known as:  GLUCOPHAGE  TAKE 1 TABLET (1,000 MG TOTAL) BY MOUTH 2 (TWO) TIMES DAILY WITH A MEAL.     phenazopyridine 200 MG tablet  Commonly known as:  PYRIDIUM  Take 1 tablet (200 mg total) by mouth 3 (three) times daily as needed for pain.     saw palmetto 160 MG capsule  Take 160 mg by mouth daily.     SYSTANE OP  Place 1 drop into the left eye daily as needed (lubrication).     telmisartan 40 MG tablet  Commonly known as:  MICARDIS  TAKE 1 TABLET (40 MG TOTAL) BY MOUTH DAILY.        Diagnostic Studies: No results found.  Disposition: 01-Home or Self Care  Discharge Instructions    Discontinue IV    Complete by:  As directed            Follow-up Information    Follow up with Malka So, MD On 06/29/2015.   Specialty:  Urology   Why:  as scheduled   Contact information:   [place] [address] [phone]        Signed: Malka So 06/24/2015, 2:02 PM

## 2015-06-29 ENCOUNTER — Other Ambulatory Visit (INDEPENDENT_AMBULATORY_CARE_PROVIDER_SITE_OTHER): Payer: Medicare Other

## 2015-06-29 DIAGNOSIS — I10 Essential (primary) hypertension: Secondary | ICD-10-CM | POA: Diagnosis not present

## 2015-06-29 DIAGNOSIS — E785 Hyperlipidemia, unspecified: Secondary | ICD-10-CM

## 2015-06-29 DIAGNOSIS — E119 Type 2 diabetes mellitus without complications: Secondary | ICD-10-CM

## 2015-06-29 LAB — BASIC METABOLIC PANEL
BUN: 17 mg/dL (ref 6–23)
CHLORIDE: 102 meq/L (ref 96–112)
CO2: 30 mEq/L (ref 19–32)
Calcium: 9.7 mg/dL (ref 8.4–10.5)
Creatinine, Ser: 0.93 mg/dL (ref 0.40–1.50)
GFR: 84.28 mL/min (ref >60.00)
Glucose, Bld: 215 mg/dL — ABNORMAL HIGH (ref 70–99)
POTASSIUM: 4.9 meq/L (ref 3.5–5.1)
SODIUM: 140 meq/L (ref 135–145)

## 2015-06-29 LAB — TSH: TSH: 1.86 u[IU]/mL (ref 0.35–4.50)

## 2015-06-29 LAB — LIPID PANEL
Cholesterol: 138 mg/dL (ref 0–200)
HDL: 41.3 mg/dL (ref >39.00)
LDL CALC: 67 mg/dL (ref 0–99)
NONHDL: 96.92
Total CHOL/HDL Ratio: 3
Triglycerides: 149 mg/dL (ref 0.0–149.0)
VLDL: 29.8 mg/dL (ref 0.0–40.0)

## 2015-06-29 LAB — CBC WITH DIFFERENTIAL/PLATELET
BASOS ABS: 0 10*3/uL (ref 0.0–0.1)
BASOS PCT: 0.4 % (ref 0.0–3.0)
EOS ABS: 0.2 10*3/uL (ref 0.0–0.7)
Eosinophils Relative: 2.7 % (ref 0.0–5.0)
HEMATOCRIT: 37.5 % — AB (ref 39.0–52.0)
Hemoglobin: 12.7 g/dL — ABNORMAL LOW (ref 13.0–17.0)
LYMPHS ABS: 1.4 10*3/uL (ref 0.7–4.0)
LYMPHS PCT: 20.9 % (ref 12.0–46.0)
MCHC: 33.9 g/dL (ref 30.0–36.0)
MCV: 89.5 fl (ref 78.0–100.0)
MONO ABS: 0.5 10*3/uL (ref 0.1–1.0)
Monocytes Relative: 7.3 % (ref 3.0–12.0)
NEUTROS ABS: 4.4 10*3/uL (ref 1.4–7.7)
NEUTROS PCT: 68.7 % (ref 43.0–77.0)
PLATELETS: 251 10*3/uL (ref 150.0–400.0)
RBC: 4.2 Mil/uL — ABNORMAL LOW (ref 4.22–5.81)
RDW: 14.9 % (ref 11.5–15.5)
WBC: 6.5 10*3/uL (ref 4.0–10.5)

## 2015-06-29 LAB — HEPATIC FUNCTION PANEL
ALBUMIN: 4.3 g/dL (ref 3.5–5.2)
ALT: 32 U/L (ref 0–53)
AST: 17 U/L (ref 0–37)
Alkaline Phosphatase: 71 U/L (ref 39–117)
BILIRUBIN TOTAL: 0.4 mg/dL (ref 0.2–1.2)
Bilirubin, Direct: 0.1 mg/dL (ref 0.0–0.3)
Total Protein: 7.5 g/dL (ref 6.0–8.3)

## 2015-07-02 ENCOUNTER — Ambulatory Visit (INDEPENDENT_AMBULATORY_CARE_PROVIDER_SITE_OTHER): Payer: Medicare Other | Admitting: Family Medicine

## 2015-07-02 ENCOUNTER — Encounter: Payer: Self-pay | Admitting: Family Medicine

## 2015-07-02 VITALS — BP 136/70 | HR 88 | Temp 97.7°F | Wt 203.0 lb

## 2015-07-02 DIAGNOSIS — E1100 Type 2 diabetes mellitus with hyperosmolarity without nonketotic hyperglycemic-hyperosmolar coma (NKHHC): Secondary | ICD-10-CM

## 2015-07-02 DIAGNOSIS — N400 Enlarged prostate without lower urinary tract symptoms: Secondary | ICD-10-CM | POA: Insufficient documentation

## 2015-07-02 DIAGNOSIS — E785 Hyperlipidemia, unspecified: Secondary | ICD-10-CM | POA: Diagnosis not present

## 2015-07-02 DIAGNOSIS — I10 Essential (primary) hypertension: Secondary | ICD-10-CM | POA: Diagnosis not present

## 2015-07-02 HISTORY — DX: Benign prostatic hyperplasia without lower urinary tract symptoms: N40.0

## 2015-07-02 NOTE — Assessment & Plan Note (Addendum)
S: suspect mildly poorly controlled. On metformin 1g BID, amaryl up to 4mg .  CBGs- are improving after getting out of hospital now down to 140s in recent days Exercise and diet- limited exercise- used to walk 5-6 miles a day. Limited by need to avoid exertion  Lab Results  Component Value Date   HGBA1C 8.9* 04/07/2015   HGBA1C 8.5* 03/19/2015   HGBA1C 7.8* 12/22/2014   A/P: very stressful situation recently with urological issues. We opted to repeat in 3 months to get a good new baseline and make further decisions from there- out of hospital CBGs are improving. Plan to talk by phone at that point and likely see each other in 6 months.

## 2015-07-02 NOTE — Assessment & Plan Note (Signed)
S: controlled on Amlodipine 5mg , telmisartan 40mg  BP Readings from Last 3 Encounters:  07/02/15 136/70  06/22/15 115/89  06/20/15 112/62  A/P:Continue current medications, doing well

## 2015-07-02 NOTE — Progress Notes (Signed)
[name], MD  Subjective:  Walter [name] is a 75 y.o. year old very pleasant male patient who presents for/with See problem oriented charting ROS- no hypoglycemia. No chest pain or shortness of breath. Does have some continued urological discomfort which is improving and under urology care  Past Medical History-  Patient Active Problem List   Diagnosis Date Noted  . BPH (benign prostatic hyperplasia) 07/02/2015    Priority: High  . Diabetes mellitus type II, uncontrolled ([place]) 04/23/2007    Priority: High  . Hyperlipidemia 04/23/2007    Priority: Medium  . Obstructive sleep apnea 04/23/2007    Priority: Medium  . Essential hypertension 04/23/2007    Priority: Medium  . Sepsis secondary to UTI ([place]) 03/29/2015    Priority: Low  . Hallux valgus of right foot 03/26/2014    Priority: Low  . History of colon cancer 01/27/2014    Priority: Low  . IRITIS 12/17/2008    Priority: Low  . 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 BIGEMINY 04/21/2008    Priority: Low    Medications- reviewed and updated Current Outpatient Prescriptions  Medication Sig Dispense Refill  . amLODipine (NORVASC) 5 MG tablet TAKE 1 TABLET (5 MG TOTAL) BY MOUTH DAILY. 90 tablet 3  . aspirin 81 MG tablet Take 81 mg by mouth daily.    Marland Kitchen atorvastatin (LIPITOR) 20 MG tablet TAKE 1 TABLET BY MOUTH ONCE PER WEEK (Patient taking differently: TAKE 1 TABLET BY MOUTH ONCE PER WEEK on Sundays) 52 tablet 2  . glimepiride (AMARYL) 4 MG tablet Take 1 tablet (4 mg total) by mouth daily before breakfast. 90 tablet 3  . glucose blood (ONE TOUCH ULTRA TEST) test strip Use as instructed to check blood sugar once a day 100 each 1  . metFORMIN (GLUCOPHAGE) 1000 MG tablet TAKE 1 TABLET (1,000 MG TOTAL) BY MOUTH 2 (TWO) TIMES DAILY WITH A MEAL. 180 tablet 2  . Multiple Vitamins-Minerals (CENTRUM SILVER PO) Take 1 tablet by mouth daily.     Marland Kitchen  telmisartan (MICARDIS) 40 MG tablet TAKE 1 TABLET (40 MG TOTAL) BY MOUTH DAILY. 90 tablet 3   No current facility-administered medications for this visit.    Objective: BP 136/70 mmHg  Pulse 88  Temp(Src) 97.7 F (36.5 C)  Wt 203 lb (92.08 kg) Gen: NAD, resting comfortably, appears more fatigued than normal for him CV: RRR no murmurs rubs or gallops Lungs: CTAB no crackles, wheeze, rhonchi Abdomen: soft/nontender/nondistended/normal bowel sounds.   Ext: no edema Skin: warm, dry Neuro: grossly normal, moves all extremities  Assessment/Plan:  Diabetes mellitus type II, uncontrolled ([facility]) S: suspect mildly poorly controlled. On metformin 1g BID, amaryl up to 4mg .  CBGs- are improving after getting out of hospital now down to 140s in recent days Exercise and diet- limited exercise- used to walk 5-6 miles a day. Limited by need to avoid exertion  Lab Results  Component Value Date   HGBA1C 8.9* 04/07/2015   HGBA1C 8.5* 03/19/2015   HGBA1C 7.8* 12/22/2014   A/P: very stressful situation recently with urological issues. We opted to repeat in 3 months to get a good new baseline and make further decisions from there- out of hospital CBGs are improving. Plan to talk by phone at that point and likely see each other in 6 months.   Hyperlipidemia S: well controlled on Atorvastatin 20mg  once weekly. No myalgias.  Lab Results  Component Value  Date   CHOL 138 06/29/2015   HDL 41.30 06/29/2015   LDLCALC 67 06/29/2015   TRIG 149.0 06/29/2015   CHOLHDL 3 06/29/2015   A/P: patient asks about stopping- given diabetic prefer excellent control he has- continue rx    Essential hypertension S: controlled on Amlodipine 5mg , telmisartan 40mg  BP Readings from Last 3 Encounters:  07/02/15 136/70  06/22/15 115/89  06/20/15 112/62  A/P:Continue current medications, doing well    Return in about 6 months (around 01/02/2016) for follow up- or sooner if needed. Return precautions advised.    Orders Placed This Encounter  Procedures  . Hemoglobin A1c    [place]    Standing Status: Future     Number of Occurrences:      Standing Expiration Date: 07/01/2016

## 2015-07-02 NOTE — Patient Instructions (Signed)
Please schedule lab visit in about 3 months- we will decide on diabetes adjustments at that time.   No changes today

## 2015-07-02 NOTE — Assessment & Plan Note (Signed)
S: well controlled on Atorvastatin 20mg  once weekly. No myalgias.  Lab Results  Component Value Date   CHOL 138 06/29/2015   HDL 41.30 06/29/2015   LDLCALC 67 06/29/2015   TRIG 149.0 06/29/2015   CHOLHDL 3 06/29/2015   A/P: patient asks about stopping- given diabetic prefer excellent control he has- continue rx

## 2015-10-02 ENCOUNTER — Other Ambulatory Visit (INDEPENDENT_AMBULATORY_CARE_PROVIDER_SITE_OTHER): Payer: Medicare Other

## 2015-10-02 DIAGNOSIS — Z0000 Encounter for general adult medical examination without abnormal findings: Secondary | ICD-10-CM

## 2015-10-02 DIAGNOSIS — E1100 Type 2 diabetes mellitus with hyperosmolarity without nonketotic hyperglycemic-hyperosmolar coma (NKHHC): Secondary | ICD-10-CM | POA: Diagnosis not present

## 2015-10-02 LAB — HEMOGLOBIN A1C: HEMOGLOBIN A1C: 8.8 % — AB (ref 4.6–6.5)

## 2015-10-06 ENCOUNTER — Encounter: Payer: Self-pay | Admitting: Family Medicine

## 2015-10-06 MED ORDER — SITAGLIPTIN PHOSPHATE 100 MG PO TABS: 100.0000 mg | ORAL_TABLET | Freq: Every day | ORAL | Status: DC

## 2015-10-06 MED ORDER — GLIMEPIRIDE 4 MG PO TABS: 8.0000 mg | ORAL_TABLET | Freq: Every day | ORAL | Status: DC

## 2015-10-09 ENCOUNTER — Ambulatory Visit (INDEPENDENT_AMBULATORY_CARE_PROVIDER_SITE_OTHER): Payer: Medicare Other | Admitting: Family Medicine

## 2015-10-09 ENCOUNTER — Encounter: Payer: Self-pay | Admitting: Family Medicine

## 2015-10-09 ENCOUNTER — Ambulatory Visit (HOSPITAL_COMMUNITY)
Admission: RE | Admit: 2015-10-09 | Discharge: 2015-10-09 | Disposition: A | Discharge status: Home / Self Care | Payer: Medicare Other | Source: Ambulatory Visit | Attending: Family Medicine | Admitting: Family Medicine

## 2015-10-09 ENCOUNTER — Telehealth: Payer: Self-pay | Admitting: Family Medicine

## 2015-10-09 VITALS — BP 138/70 | HR 97 | Temp 98.6°F | Ht 72.0 in | Wt 207.0 lb

## 2015-10-09 DIAGNOSIS — J09X2 Influenza due to identified novel influenza A virus with other respiratory manifestations: Secondary | ICD-10-CM | POA: Diagnosis not present

## 2015-10-09 DIAGNOSIS — R509 Fever, unspecified: Secondary | ICD-10-CM | POA: Insufficient documentation

## 2015-10-09 DIAGNOSIS — R05 Cough: Secondary | ICD-10-CM | POA: Insufficient documentation

## 2015-10-09 DIAGNOSIS — R059 Cough, unspecified: Secondary | ICD-10-CM

## 2015-10-09 LAB — POCT INFLUENZA A: RAPID INFLUENZA A AGN: NEGATIVE

## 2015-10-09 NOTE — Progress Notes (Signed)
Pre visit review using our clinic review tool, if applicable. No additional management support is needed unless otherwise documented below in the visit note. 

## 2015-10-09 NOTE — Progress Notes (Signed)
Subjective:  Walter [name] is a 75 y.o. year old very pleasant male patient who presents for/with See problem oriented charting ROS- see ROS included in HPI as well.   Past Medical History-  Patient Active Problem List   Diagnosis Date Noted  . BPH (benign prostatic hyperplasia) 07/02/2015    Priority: High  . Diabetes mellitus type II, uncontrolled ([place]) 04/23/2007    Priority: High  . Hyperlipidemia 04/23/2007    Priority: Medium  . Obstructive sleep apnea 04/23/2007    Priority: Medium  . Essential hypertension 04/23/2007    Priority: Medium  . Sepsis secondary to UTI ([place]) 03/29/2015    Priority: Low  . Hallux valgus of right foot 03/26/2014    Priority: Low  . History of colon cancer 01/27/2014    Priority: Low  . IRITIS 12/17/2008    Priority: Low  . 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 BIGEMINY 04/21/2008    Priority: Low    Medications- reviewed and updated Current Outpatient Prescriptions  Medication Sig Dispense Refill  . amLODipine (NORVASC) 5 MG tablet TAKE 1 TABLET (5 MG TOTAL) BY MOUTH DAILY. 90 tablet 3  . aspirin 81 MG tablet Take 81 mg by mouth daily.    Marland Kitchen atorvastatin (LIPITOR) 20 MG tablet TAKE 1 TABLET BY MOUTH ONCE PER WEEK (Patient taking differently: TAKE 1 TABLET BY MOUTH ONCE PER WEEK on Sundays) 52 tablet 2  . glimepiride (AMARYL) 4 MG tablet Take 2 tablets (8 mg total) by mouth daily before breakfast. 180 tablet 1  . glucose blood (ONE TOUCH ULTRA TEST) test strip Use as instructed to check blood sugar once a day 100 each 1  . metFORMIN (GLUCOPHAGE) 1000 MG tablet TAKE 1 TABLET (1,000 MG TOTAL) BY MOUTH 2 (TWO) TIMES DAILY WITH A MEAL. 180 tablet 2  . Multiple Vitamins-Minerals (CENTRUM SILVER PO) Take 1 tablet by mouth daily.     . sitaGLIPtin (JANUVIA) 100 MG tablet Take 1 tablet (100 mg total) by mouth daily. 90 tablet 1  . telmisartan (MICARDIS) 40 MG tablet TAKE 1  TABLET (40 MG TOTAL) BY MOUTH DAILY. 90 tablet 3   No current facility-administered medications for this visit.    Objective: BP 138/70 mmHg  Pulse 97  Temp(Src) 98.6 F (37 C) (Oral)  Ht 6' (1.829 m)  Wt 207 lb (93.895 kg)  BMI 28.07 kg/m2  SpO2 94% Gen: NAD, resting comfortably CV: RRR no murmurs rubs or gallops Lungs: CTAB no crackles, wheeze, rhonchi Abdomen: soft/nontender/nondistended/normal bowel sounds. No rebound or guarding.  Rectal: normal tone, normal size prostate s/p TURP, no masses or tenderness Ext: no edema Skin: warm, dry, no rash Neuro: grossly normal, moves all extremities  Results for orders placed or performed in visit on 10/09/15 (from the past 24 hour(s))  POCT Influenza A     Status: None   Collection Time: 10/09/15  5:08 PM  Result Value Ref Range   Rapid Influenza A Ag negative    Assessment/Plan:  Fever unknown origin S: went in to urology a week ago Wednesday- took urine sample and was told there was slight infection/inflammationin urine. Patient went in for routine follow up- denies UTI symptoms. Started on bactrim Tuesday night of this week. That night he developed dry heaves several times that night. 2 had 2 regular bowel movements. Wednesday morning found sleeping on couch- goes back to bed and sleeps until 4 pm. Had  2nd pill at 4 o clock. Thursday morning started with fever and chills up to 102.8 varying down to 96. Wearing multiple layers then wakes up sweating. Continues to have fevers that resolve with tylenol at this point.   Went to urology this morning. No rectal exam. Did urine and stated slight blood and some bacteria but improving. Dr. Alyson Ingles. CT scan abdomen/pelvis- normal. He was told to follow up with primary care for a flu test as potential cause.   In regards to symptoms, patient has mucusy cough intermittently into last year. Not worse recently. Not short of breath.He does have some fatigue nad states feels slightly more off  balance than normal.   Inquired about any potential tick bite exposure and patient strongly denies.   ROS- denies polyuria, dysuria, rectal pain. No vomiting since several nights ago. No neck pain or stiffness A/P: Flu test done primarily since that is what patient was sent to our office for-negative as expected. My concern is patient previously had similar symptoms which ultimately led to sepsis with UTI origin- thankfullly under care of urology and on appropriate antibiotic per patient (I do not have access to culture results). We did a CXR with cough history which was negative for pneumonia. He will continue antibiotic and update me Monday, if worsening symptoms seek care in ED over weekend. Very slightly dry on exam- needs to push fluids.   Return precautions advised.   Orders Placed This Encounter  Procedures  . DG Chest 2 View    Standing Status: Future     Number of Occurrences: 1     Standing Expiration Date: 12/08/2016    Order Specific Question:  Reason for Exam (SYMPTOM  OR DIAGNOSIS REQUIRED)    Answer:  fever 102.8, cough preceeding    Order Specific Question:  Preferred imaging location?    Answer:  [facility]  . POCT Influenza A  new acute issues, high risk patient with history of sepsis and DM, unknown prognosis  [name], MD

## 2015-10-09 NOTE — Telephone Encounter (Signed)
Pt just left urologist office, has been running a high fever since Wed. Wife states it was as high as 102.3. Then pt   takes tylenol and it will go down. This has been going on since then. (up and down)  Pt states the urologist suggested pt get a flu test.  Pt wanted to come on over, but advised wife I would need to ask,  because Dr Yong Channel would probably need to see pt. No appointments today at [facility]. Please advise, Thank you.

## 2015-10-09 NOTE — Telephone Encounter (Signed)
Pt has been scheduled.  °

## 2015-10-09 NOTE — Telephone Encounter (Signed)
Yes thanks advised to come in around 4 and we would work him in and hopefully see by 5 15 at [place]

## 2015-10-09 NOTE — Patient Instructions (Signed)
Results for orders placed or performed in visit on 10/09/15 (from the past 24 hour(s))  POCT Influenza A     Status: None   Collection Time: 10/09/15  5:08 PM  Result Value Ref Range   Rapid Influenza A Ag negative    Check rectal for prostatitis concern  Go to [facility] ED and walk in- tell them that you are there for x-rays only and you do not need to be seen by ed physician

## 2015-10-15 ENCOUNTER — Other Ambulatory Visit: Payer: Self-pay | Admitting: Family Medicine

## 2015-10-15 ENCOUNTER — Encounter: Payer: Self-pay | Admitting: Family Medicine

## 2015-10-15 ENCOUNTER — Ambulatory Visit (INDEPENDENT_AMBULATORY_CARE_PROVIDER_SITE_OTHER): Payer: Medicare Other | Admitting: Family Medicine

## 2015-10-15 VITALS — BP 98/66 | HR 87 | Temp 97.7°F | Ht 72.0 in | Wt 205.0 lb

## 2015-10-15 DIAGNOSIS — R509 Fever, unspecified: Secondary | ICD-10-CM | POA: Diagnosis not present

## 2015-10-15 LAB — COMPREHENSIVE METABOLIC PANEL
ALBUMIN: 4 g/dL (ref 3.5–5.2)
ALK PHOS: 78 U/L (ref 39–117)
ALT: 34 U/L (ref 0–53)
AST: 19 U/L (ref 0–37)
BILIRUBIN TOTAL: 0.5 mg/dL (ref 0.2–1.2)
BUN: 23 mg/dL (ref 6–23)
CALCIUM: 9.9 mg/dL (ref 8.4–10.5)
CO2: 29 mEq/L (ref 19–32)
CREATININE: 0.99 mg/dL (ref 0.40–1.50)
Chloride: 97 mEq/L (ref 96–112)
GFR: 78.35 mL/min (ref >60.00)
Glucose, Bld: 204 mg/dL — ABNORMAL HIGH (ref 70–99)
POTASSIUM: 5 meq/L (ref 3.5–5.1)
SODIUM: 135 meq/L (ref 135–145)
TOTAL PROTEIN: 6.9 g/dL (ref 6.0–8.3)

## 2015-10-15 LAB — CBC WITH DIFFERENTIAL/PLATELET
BASOS PCT: 0.5 % (ref 0.0–3.0)
Basophils Absolute: 0 10*3/uL (ref 0.0–0.1)
EOS PCT: 0.1 % (ref 0.0–5.0)
Eosinophils Absolute: 0 10*3/uL (ref 0.0–0.7)
HEMATOCRIT: 36.7 % — AB (ref 39.0–52.0)
HEMOGLOBIN: 12.5 g/dL — AB (ref 13.0–17.0)
LYMPHS PCT: 35.6 % (ref 12.0–46.0)
Lymphs Abs: 1.5 10*3/uL (ref 0.7–4.0)
MCHC: 34 g/dL (ref 30.0–36.0)
MCV: 88.5 fl (ref 78.0–100.0)
MONO ABS: 0.6 10*3/uL (ref 0.1–1.0)
MONOS PCT: 13.4 % — AB (ref 3.0–12.0)
Neutro Abs: 2.2 10*3/uL (ref 1.4–7.7)
Neutrophils Relative %: 50.4 % (ref 43.0–77.0)
Platelets: 220 10*3/uL (ref 150.0–400.0)
RBC: 4.15 Mil/uL — AB (ref 4.22–5.81)
RDW: 14.5 % (ref 11.5–15.5)
WBC: 4.3 10*3/uL (ref 4.0–10.5)

## 2015-10-15 MED ORDER — DOXYCYCLINE HYCLATE 100 MG PO TABS: 100.0000 mg | ORAL_TABLET | Freq: Two times a day (BID) | ORAL | Status: DC

## 2015-10-15 NOTE — Progress Notes (Signed)
Pre visit review using our clinic review tool, if applicable. No additional management support is needed unless otherwise documented below in the visit note. 

## 2015-10-15 NOTE — Patient Instructions (Signed)
Labs before you leave  Cover with doxycycline in case this is tick related- also test for tick related disease. i do wonder if drug fever could have been cause of prior fever but doesn't explain low temperature and blood pressure today  If you get to feeling worse- go to hospital with your history of sepsis under somewhat similar circumstances

## 2015-10-15 NOTE — Progress Notes (Signed)
Subjective:  Walter [name] is a 75 y.o. year old very pleasant male patient who presents for/with See problem oriented charting ROS- admits to poor appetite, mild cough. No shortness of breath or chest pain. No fever since Tuesday but was hypothermic this morning. No ear pain or sore throat..see any ROS included in HPI as well.   Past Medical History-  Patient Active Problem List   Diagnosis Date Noted  . BPH (benign prostatic hyperplasia) 07/02/2015    Priority: High  . Diabetes mellitus type II, uncontrolled ([place]) 04/23/2007    Priority: High  . Hyperlipidemia 04/23/2007    Priority: Medium  . Obstructive sleep apnea 04/23/2007    Priority: Medium  . Essential hypertension 04/23/2007    Priority: Medium  . Sepsis secondary to UTI ([place]) 03/29/2015    Priority: Low  . Hallux valgus of right foot 03/26/2014    Priority: Low  . History of colon cancer 01/27/2014    Priority: Low  . IRITIS 12/17/2008    Priority: Low  . 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 BIGEMINY 04/21/2008    Priority: Low    Medications- reviewed and updated Current Outpatient Prescriptions  Medication Sig Dispense Refill  . amLODipine (NORVASC) 5 MG tablet TAKE 1 TABLET (5 MG TOTAL) BY MOUTH DAILY. 90 tablet 3  . aspirin 81 MG tablet Take 81 mg by mouth daily.    Marland Kitchen atorvastatin (LIPITOR) 20 MG tablet TAKE 1 TABLET BY MOUTH ONCE PER WEEK (Patient taking differently: TAKE 1 TABLET BY MOUTH ONCE PER WEEK on Sundays) 52 tablet 2  . glimepiride (AMARYL) 4 MG tablet Take 2 tablets (8 mg total) by mouth daily before breakfast. 180 tablet 1  . glucose blood (ONE TOUCH ULTRA TEST) test strip Use as instructed to check blood sugar once a day 100 each 1  . metFORMIN (GLUCOPHAGE) 1000 MG tablet TAKE 1 TABLET (1,000 MG TOTAL) BY MOUTH 2 (TWO) TIMES DAILY WITH A MEAL. 180 tablet 2  . Multiple Vitamins-Minerals (CENTRUM SILVER PO) Take 1  tablet by mouth daily.     . sitaGLIPtin (JANUVIA) 100 MG tablet Take 1 tablet (100 mg total) by mouth daily. 90 tablet 1  . telmisartan (MICARDIS) 40 MG tablet TAKE 1 TABLET (40 MG TOTAL) BY MOUTH DAILY. 90 tablet 3   No current facility-administered medications for this visit.    Objective: BP 98/66 mmHg  Pulse 87  Temp(Src) 97.7 F (36.5 C) (Oral)  Ht 6' (1.829 m)  Wt 205 lb (92.987 kg)  BMI 27.80 kg/m2  SpO2 95% Gen: NAD, resting comfortably TM normal, oropharynx normal, nares normal. CV: RRR no murmurs rubs or gallops Lungs: CTAB no crackles, wheeze, rhonchi Abdomen: soft/nontender/nondistended/normal bowel sounds. No rebound or guarding. No hepatosplenomegaly Ext: no edema Skin: warm, dry Neuro: grossly normal, moves all extremities  Assessment/Plan:  Fever/sweats S: I saw patient on June 9 when he was referred to urologist due to fever and concern for potential flu. Flu swab was negative as expected given the season.  Previously patient had been under treatment with Bactrim for potential UTI but he did not have any symptoms before he was started on this medication but did grow unknown infection on urine culture. Due to history of sepsis he was placed on this medication as a prophylactic measure to prevent him from getting sicker. He had not been febrile until he started the Bactrim and within 24-48 hours  he was febrile. He finished course of antibiotic and remained intermittently febrile until he stopped the antibiotic. The next day he did not have any fever. We also did a chest x-ray which was negative for pneumonia. Patient has not recently had any tick bites but does well-controlled a lot and does have potential exposure. Apparently when he follows up with urologist his urinalysis was improved he also had a kidney and bladder scan which were normal.  Even walked yesterday and today before and states was feeling better to some degree. Today he noted temperatures below 96 on  several occasions at home. He also woke up drenched in his own sweat. His only symptom really remains fatigue as well as some chronic lingering cough. But this was for months and before acute illness.  Of note patient was hospitalized last November for sepsis and did have a nonspecific set of symptoms when he does today. The only difference at that time was that he was also experiencing confusion. A/P: Given severity of prior illness with nonspecific symptoms except for fever-we will check blood and urine cultures. CBC and CMP have come back largely unremarkable. We will also test for Lyme disease and RMSF. Blood pressure lower than normal today we will just telmisartan. Given low appetite hold glimepiride as well. Finally given his nature walks we will also cover with doxycycline for potential RMSF. I gave him strict precautions to proceed to the ED-I wonder honestly if he will eventually need IV antibiotics. He was afebrile today but he with lower temp and BP for him prompting my concern. He is well appearing other than fatigue.   Orders Placed This Encounter  Procedures  . Culture, blood (single)  . Culture, blood (single)  . Urine culture  . CBC with Differential/Platelet  . Comprehensive metabolic panel    [place]  . [place] spotted fvr abs pnl(IgG+IgM)  . B. [place] Antibodies    Meds ordered this encounter  Medications  . doxycycline (VIBRA-TABS) 100 MG tablet    Sig: Take 1 tablet (100 mg total) by mouth 2 (two) times daily.    Dispense:  14 tablet    Refill:  0   The duration of face-to-face time during this visit was 25 minutes. Greater than 50% of this time was spent in counseling, explanation of diagnosis, planning of further management, and/or coordination of care.     Return precautions advised.  [name], MD

## 2015-10-15 NOTE — Telephone Encounter (Signed)
Let's do same thing as last time- work you in this afternoon. I would just come as soon as you are available and I will work you in. The earlier the better for [place] even if you could get here at 12:45 because we will have to put you on schedule too. If not then- get here later- we need to make sure temperature is not really that low. I am likely to do bloodwork as well

## 2015-10-16 ENCOUNTER — Encounter: Payer: Self-pay | Admitting: Family Medicine

## 2015-10-16 LAB — LYME AB/WESTERN BLOT REFLEX

## 2015-10-17 LAB — URINE CULTURE
COLONY COUNT: NO GROWTH
ORGANISM ID, BACTERIA: NO GROWTH

## 2015-10-17 LAB — ROCKY MTN SPOTTED FVR ABS PNL(IGG+IGM)
RMSF IGG: NOT DETECTED
RMSF IgM: NOT DETECTED

## 2015-10-22 LAB — CULTURE, BLOOD (SINGLE)
ORGANISM ID, BACTERIA: NO GROWTH
ORGANISM ID, BACTERIA: NO GROWTH

## 2015-11-06 ENCOUNTER — Telehealth: Payer: Self-pay

## 2015-11-06 NOTE — Telephone Encounter (Signed)
Patient is on the list for [employer] 2017 and may be a good candidate for an AWV in 2017. Please let me know if/when appt is scheduled.   

## 2016-01-05 ENCOUNTER — Ambulatory Visit (INDEPENDENT_AMBULATORY_CARE_PROVIDER_SITE_OTHER): Payer: Medicare Other | Admitting: Family Medicine

## 2016-01-05 ENCOUNTER — Encounter: Payer: Self-pay | Admitting: Family Medicine

## 2016-01-05 VITALS — BP 138/78 | HR 72 | Temp 97.9°F | Wt 208.4 lb

## 2016-01-05 DIAGNOSIS — I10 Essential (primary) hypertension: Secondary | ICD-10-CM | POA: Diagnosis not present

## 2016-01-05 DIAGNOSIS — R300 Dysuria: Secondary | ICD-10-CM

## 2016-01-05 DIAGNOSIS — E119 Type 2 diabetes mellitus without complications: Secondary | ICD-10-CM

## 2016-01-05 DIAGNOSIS — E785 Hyperlipidemia, unspecified: Secondary | ICD-10-CM | POA: Diagnosis not present

## 2016-01-05 LAB — POCT GLYCOSYLATED HEMOGLOBIN (HGB A1C): Hemoglobin A1C: 6.7

## 2016-01-05 NOTE — Progress Notes (Signed)
Subjective:  Walter [name] is a 75 y.o. year old very pleasant male patient who presents for/with See problem oriented charting ROS- No chest pain or shortness of breath. No headache or blurry vision. No hypoglyemia.see any ROS included in HPI as well.   Past Medical History-  Patient Active Problem List   Diagnosis Date Noted  . BPH (benign prostatic hyperplasia) 07/02/2015    Priority: High  . Diabetes mellitus type II, uncontrolled ([place]) 04/23/2007    Priority: High  . Hyperlipidemia 04/23/2007    Priority: Medium  . Obstructive sleep apnea 04/23/2007    Priority: Medium  . Essential hypertension 04/23/2007    Priority: Medium  . Sepsis secondary to UTI ([place]) 03/29/2015    Priority: Low  . Hallux valgus of right foot 03/26/2014    Priority: Low  . History of colon cancer 01/27/2014    Priority: Low  . IRITIS 12/17/2008    Priority: Low  . 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 BIGEMINY 04/21/2008    Priority: Low    Medications- reviewed and updated Current Outpatient Prescriptions  Medication Sig Dispense Refill  . amLODipine (NORVASC) 5 MG tablet TAKE 1 TABLET (5 MG TOTAL) BY MOUTH DAILY. 90 tablet 3  . aspirin 81 MG tablet Take 81 mg by mouth daily.    Marland Kitchen atorvastatin (LIPITOR) 20 MG tablet TAKE 1 TABLET BY MOUTH ONCE PER WEEK (Patient taking differently: TAKE 1 TABLET BY MOUTH ONCE PER WEEK on Sundays) 52 tablet 2  . glimepiride (AMARYL) 4 MG tablet Take 2 tablets (8 mg total) by mouth daily before breakfast. 180 tablet 1  . glucose blood (ONE TOUCH ULTRA TEST) test strip Use as instructed to check blood sugar once a day 100 each 1  . metFORMIN (GLUCOPHAGE) 1000 MG tablet TAKE 1 TABLET (1,000 MG TOTAL) BY MOUTH 2 (TWO) TIMES DAILY WITH A MEAL. 180 tablet 2  . Multiple Vitamins-Minerals (CENTRUM SILVER PO) Take 1 tablet by mouth daily.     . sitaGLIPtin (JANUVIA) 100 MG tablet Take 1 tablet  (100 mg total) by mouth daily. 90 tablet 1  . telmisartan (MICARDIS) 40 MG tablet TAKE 1 TABLET (40 MG TOTAL) BY MOUTH DAILY. 90 tablet 3   No current facility-administered medications for this visit.     Objective: BP 138/78 (BP Location: Left Arm, Patient Position: Sitting, Cuff Size: Normal)   Pulse 72   Temp 97.9 F (36.6 C) (Oral)   Wt 208 lb 6.4 oz (94.5 kg)   SpO2 94%   BMI 28.26 kg/m  Gen: NAD, resting comfortably CV: RRR no murmurs rubs or gallops Lungs: CTAB no crackles, wheeze, rhonchi Abdomen: soft/nontender/nondistended/normal bowel sounds. overweight Ext: no edema Skin: warm, dry, no rash Neuro: grossly normal, moves all extremities  Assessment/Plan:  Dysuria S:urine very strong in odor and very mild burning for a few days A/P: wants urine culture - willtest  Diabetes mellitus type II, uncontrolled ([place]) S: much improved controlled. On metformin 1g BID, amaryl 8mg , januvia (lethargic in pas- has recurred).  CBGs- averaging 140s fasting but on my review more in 150s. Wonders if meter ok- bring next visit Lab Results  Component Value Date   HGBA1C 6.7 01/05/2016   HGBA1C 8.8 (H) 10/02/2015   HGBA1C 8.9 (H) 04/07/2015   A/P: already drinks 120 oz a day and frequent urination as result, plus UTI history leading to sepsis- invokana not idea. So we  are limited on oral agents. Fortunately a1c came way down with addition of januvia (has some fatigue but think she can tolerate). Will add Diabetes education - hopeful maybe we can come off glimepiride or januvia with some improvements in future through diet/exercise. He is walking regularly- closing in on prior 5 miles a day   Hyperlipidemia S: well controlled on atorvastatin 20mg  once a week. No myalgias.  Lab Results  Component Value Date   CHOL 138 06/29/2015   HDL 41.30 06/29/2015   LDLCALC 67 06/29/2015   TRIG 149.0 06/29/2015   CHOLHDL 3 06/29/2015   A/P: no change in therapy  Essential hypertension S:  controlled. On amlodipine 5mg  and telmisartan 40mg  BP Readings from Last 3 Encounters:  01/05/16 138/78  10/15/15 98/66  10/09/15 138/70  A/P:Continue current meds:  No therapy change   Return in about 4 months (around 05/06/2016).  Orders Placed This Encounter  Procedures  . Urine culture    [facility]  . Ambulatory referral to diabetic education    Referral Priority:   Routine    Referral Type:   Consultation    Referral Reason:   Specialty Services Required    Number of Visits Requested:   1  . POCT glycosylated hemoglobin (Hb A1C)   Return precautions advised.  [name], MD

## 2016-01-05 NOTE — Assessment & Plan Note (Signed)
S: much improved controlled. On metformin 1g BID, amaryl 8mg , januvia (lethargic in pas- has recurred).  CBGs- averaging 140s fasting but on my review more in 150s. Wonders if meter ok- bring next visit Lab Results  Component Value Date   HGBA1C 6.7 01/05/2016   HGBA1C 8.8 (H) 10/02/2015   HGBA1C 8.9 (H) 04/07/2015   A/P: already drinks 120 oz a day and frequent urination as result, plus UTI history leading to sepsis- invokana not idea. So we are limited on oral agents. Fortunately a1c came way down with addition of januvia (has some fatigue but think she can tolerate). Will add Diabetes education - hopeful maybe we can come off glimepiride or januvia with some improvements in future through diet/exercise. He is walking regularly- closing in on prior 5 miles a day

## 2016-01-05 NOTE — Progress Notes (Signed)
Pre visit review using our clinic review tool, if applicable. No additional management support is needed unless otherwise documented below in the visit note. 

## 2016-01-05 NOTE — Assessment & Plan Note (Signed)
S: controlled. On amlodipine 5mg  and telmisartan 40mg  BP Readings from Last 3 Encounters:  01/05/16 138/78  10/15/15 98/66  10/09/15 138/70  A/P:Continue current meds:  No therapy change

## 2016-01-05 NOTE — Patient Instructions (Addendum)
Lab Results  Component Value Date   HGBA1C 6.7 01/05/2016  We are going to continue metformin 1g twice a day, amaryl 8mg  once a day, januvia due to excellent control. Also continue your exercise. Refer to diabetes education to see if any other tweaks to help a1c remain low because these are our last oral options   We will call you within a week about your referral to diabetes education. If you do not hear within 2 weeks, give Walter [name] a call.   I would also like for you to sign up for an annual wellness visit on a Friday with our nurse Manuela Schwartz. This is a free benefit under medicare that may help Walter [name] find additional ways to help you.

## 2016-01-05 NOTE — Assessment & Plan Note (Signed)
S: well controlled on atorvastatin 20mg  once a week. No myalgias.  Lab Results  Component Value Date   CHOL 138 06/29/2015   HDL 41.30 06/29/2015   LDLCALC 67 06/29/2015   TRIG 149.0 06/29/2015   CHOLHDL 3 06/29/2015   A/P: no change in therapy

## 2016-01-07 LAB — URINE CULTURE

## 2016-01-08 ENCOUNTER — Other Ambulatory Visit: Payer: Self-pay | Admitting: Family Medicine

## 2016-01-08 MED ORDER — SULFAMETHOXAZOLE-TRIMETHOPRIM 800-160 MG PO TABS: 1.0000 | ORAL_TABLET | Freq: Two times a day (BID) | ORAL | 0 refills | Status: DC

## 2016-01-10 ENCOUNTER — Encounter: Payer: Self-pay | Admitting: Family Medicine

## 2016-01-11 ENCOUNTER — Other Ambulatory Visit: Payer: Self-pay | Admitting: Family Medicine

## 2016-01-11 ENCOUNTER — Telehealth: Payer: Self-pay

## 2016-01-11 MED ORDER — AMOXICILLIN-POT CLAVULANATE 875-125 MG PO TABS: 1.0000 | ORAL_TABLET | Freq: Two times a day (BID) | ORAL | 0 refills | Status: DC

## 2016-01-11 NOTE — Telephone Encounter (Signed)
[facility] Night - Client [place] Patient Name: Walter [name] Gender: Male DOB: 11/22/1940 Age: 75 Y 1 M 20 D Return Phone Number: [phone] (Primary) Address: City/State/Zip: [place] [address] Client [facility] Night - Client Client Site [facility] - Night Physician [name] - MD Contact Type Call Who Is Calling Patient / Member / Family / Caregiver Call Type Triage / Clinical Caller Name Mamoudou Digirolamo Relationship To Patient Spouse Return Phone Number [phone] (Primary) Chief Complaint Medication reaction Reason for Call Symptomatic / Request for [place] states that her husband was seen on Wednesday for a UTI, he is having shakes, chills, vomiting and fever of 101 possible due to his medication PreDisposition Call Doctor Translation No Nurse Assessment Nurse: Beryl Meager, RN, Mickel Baas Date/Time Eilene Ghazi Time): 01/09/2016 2:09:45 PM Confirm and document reason for call. If symptomatic, describe symptoms. You must click the next button to save text entered. ---Caller states that her husband was seen on Wednesday for glucose check , was called by office yesterday, stating he has UTI based on culture taken on Wed. Started the medicine, sulfamethoxazole-tmp ds, took 1 dose last night, took 1 dose this morning. After both doses, he is having shakes, chills, vomiting, and fever of 101.8 orally, . has had reaction to antibiotic in past, may have been a sulfa based medication. [facility]. Has the patient traveled out of the country within the last 30 days? ---No Does the patient have any new or worsening symptoms? ---Yes Will a triage be completed? ---Yes Related visit to physician within the last 2 weeks? ---Yes Does the PT have any chronic conditions? (i.e. diabetes, asthma, etc.) ---Yes List chronic  conditions. ---DM, htn, high cholesterol Is this a behavioral health or substance abuse call? ---No Nurse: Beryl Meager, RN, Mickel Baas Date/Time Eilene Ghazi Time): 01/09/2016 2:20:22 PM Please select the assessment type ---Verbal order / New medication order Does the client directives allow for assistance with medications after hours? ---Yes PLEASE NOTE: All timestamps contained within this report are represented as [place] Standard Time. CONFIDENTIALTY NOTICE: This fax transmission is intended only for the addressee. It contains information that is legally privileged, confidential or otherwise protected from use or disclosure. If you are not the intended recipient, you are strictly prohibited from reviewing, disclosing, copying using or disseminating any of this information or taking any action in reliance on or regarding this information. If you have received this fax in error, please notify us immediately by telephone so that we can arrange for its return to [place]. Phone: [phone], Toll-Free: [phone], Fax: [phone] Page: 2 of 3 Call Id: [id number] Nurse Assessment Other current medications? ---Yes List current medications. ---metformin, amlodipine, telmisartin, glimiperide, atorvastatin (weekly), januvia Medication allergies? ---Unknown Pharmacy name and phone number. [id number] Does the client directive allow for RN to call in the medication order to the pharmacy? ---Yes Guidelines Guideline Title Affirmed Question Affirmed Notes Nurse Date/Time Eilene Ghazi Time) Vomiting High-risk adult (e.g., diabetes mellitus, brain tumor, V-P shunt, hernia) Beryl Meager, RN, Mickel Baas 01/09/2016 2:17:32 PM Disp. Time Eilene Ghazi Time) Disposition Final User 01/09/2016 2:31:50 PM Called On-Call Provider Beryl Meager, RN, Mickel Baas 01/09/2016 2:33:42 PM Call Completed Beryl Meager, RN, Mickel Baas 01/09/2016 2:24:51 PM Go to ED Now (or PCP triage) Yes Beryl Meager, RN, Elige Radon Understands: Yes Disagree/Comply: Comply Care Advice  Given Per Guideline GO TO ED NOW (OR PCP TRIAGE): * IF NO PCP TRIAGE: You need to  be seen. [facility] within the next hour. Leave as soon as you can. CARE ADVICE per Vomiting (Adult) guideline. Comments User: Lynne Leader, RN Date/Time Eilene Ghazi Time): 01/09/2016 2:27:18 PM Per [email or web address]: Bactrim (sulfamethoxazole and trimethoprim) DS is a combination of two antibiotics used to treat urinary tract infections, acute otitis media, bronchitis, Shigellosis, Pneumocystis pneumonia, traveler's diarrhea, methicillin-resistant Staphylococcus aureus (MRSA), and other bacterial infections susceptible to this antibiotic. Bactrim is available as a generic drug. Common side effects of Bactrim include: loss of appetite, nausea, vomiting, painful or swollen tongue, dizziness, spinning sensation, ringing in your ears, tiredness, or sleep problems (insomnia). User: Lynne Leader, RN Date/Time Eilene Ghazi Time): 01/09/2016 2:33:34 PM Called spouse back, advised to take pt to UC or ED to be reevaluated, Verbalized understanding. Referrals GO TO FACILITY UNDECIDED PLEASE NOTE: All timestamps contained within this report are represented as [place] Standard Time. CONFIDENTIALTY NOTICE: This fax transmission is intended only for the addressee. It contains information that is legally privileged, confidential or otherwise protected from use or disclosure. If you are not the intended recipient, you are strictly prohibited from reviewing, disclosing, copying using or disseminating any of this information or taking any action in reliance on or regarding this information. If you have received this fax in error, please notify us immediately by telephone so that we can arrange for its return to [place]. Phone: [phone], Toll-Free: [phone], Fax: [phone] Page: 3 of 3 Call Id: [id number] Paging DoctorName Phone DateTime Result/Outcome Message Type Notes Howard Pouch  [id number] 01/09/2016 2:31:50 PM Called On Call Provider - Reached Doctor Paged Howard Pouch 01/09/2016 2:32:41 PM Spoke with On Call - General Message Result Spoke with Dr. Raoul Pitch, she agrees with disposition to be seen in UC or ED, could be a worsening of the infection. Needs to be seen before any change to antibiotic.

## 2016-01-11 NOTE — Telephone Encounter (Signed)
Listed this medication as an allergy. We also received a message via My Chart from the patient explaining that he was seen over the weekend and what treatment was received.

## 2016-01-25 ENCOUNTER — Other Ambulatory Visit: Payer: Medicare Other

## 2016-01-25 DIAGNOSIS — R300 Dysuria: Secondary | ICD-10-CM

## 2016-01-26 LAB — URINE CULTURE: ORGANISM ID, BACTERIA: NO GROWTH

## 2016-02-11 LAB — HM DIABETES EYE EXAM
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
## 2016-02-19 ENCOUNTER — Ambulatory Visit (INDEPENDENT_AMBULATORY_CARE_PROVIDER_SITE_OTHER): Payer: Medicare Other

## 2016-02-19 VITALS — BP 120/80 | HR 71 | Ht 72.0 in | Wt 205.4 lb

## 2016-02-19 DIAGNOSIS — Z0000 Encounter for general adult medical examination without abnormal findings: Secondary | ICD-10-CM

## 2016-02-19 NOTE — Patient Instructions (Addendum)
Walter [name] , Thank you for taking time to come for your Medicare Wellness Visit. I appreciate your ongoing commitment to your health goals. Please review the following plan we discussed and let me know if I can assist you in the future.   Good luck working on diet;   These are the goals we discussed: Goals    . patient          Get A1c down to 6; need to continue exercising; Portion control Fat free or low fat dairy products Fish high in omega-3 acids ( salmon, tuna, trout) Fruits, such as apples, bananas, oranges, pears, prunes Legumes, such as kidney beans, lentils, checkpeas, black-eyed peas and lima beans Vegetables; broccoli, cabbage, carrots Whole grains;   Plant fats are better; decrease "white" foods as pasta, rice, bread and desserts, sugar; Avoid red meat (limiting) palm and coconut oils; sugary foods and beverages  Two nutrients that raise blood chol levels are saturated fats and trans fat; in hydrogenated oils and fats, as stick margarine, baked goods (cookes, cakes, pies, crackers; frosting; and coffee creamers;   Some Fats lower cholesterol: Monounsaturated and polyunsaturated  Avocados Corn, sunflower, and soybean oils Nuts and seeds, such as walnuts Olive, canola, peanut, safflower, and sesame oils Peanut butter Salmon and trout Tofu          This is a list of the screening recommended for you and due dates:  Health Maintenance  Topic Date Due  . Complete foot exam   01/28/2015  . Eye exam for diabetics  02/09/2016  . Shingles Vaccine  08/19/2016*  . Hemoglobin A1C  07/04/2016  . Tetanus Vaccine  04/03/2017  . Colon Cancer Screening  11/13/2019  . Flu Shot  Completed  . Pneumonia vaccines  Completed  *Topic was postponed. The date shown is not the original due date.      Fall Prevention in the Home  Falls can cause injuries. They can happen to people of all ages. There are many things you can do to make your home safe and to help prevent falls.  WHAT  CAN I DO ON THE OUTSIDE OF MY HOME?  Regularly fix the edges of walkways and driveways and fix any cracks.  Remove anything that might make you trip as you walk through a door, such as a raised step or threshold.  Trim any bushes or trees on the path to your home.  Use bright outdoor lighting.  Clear any walking paths of anything that might make someone trip, such as rocks or tools.  Regularly check to see if handrails are loose or broken. Make sure that both sides of any steps have handrails.  Any raised decks and porches should have guardrails on the edges.  Have any leaves, snow, or ice cleared regularly.  Use sand or salt on walking paths during winter.  Clean up any spills in your garage right away. This includes oil or grease spills. WHAT CAN I DO IN THE BATHROOM?   Use night lights.  Install grab bars by the toilet and in the tub and shower. Do not use towel bars as grab bars.  Use non-skid mats or decals in the tub or shower.  If you need to sit down in the shower, use a plastic, non-slip stool.  Keep the floor dry. Clean up any water that spills on the floor as soon as it happens.  Remove soap buildup in the tub or shower regularly.  Attach bath mats securely with double-sided non-slip  rug tape.  Do not have throw rugs and other things on the floor that can make you trip. WHAT CAN I DO IN THE BEDROOM?  Use night lights.  Make sure that you have a light by your bed that is easy to reach.  Do not use any sheets or blankets that are too big for your bed. They should not hang down onto the floor.  Have a firm chair that has side arms. You can use this for support while you get dressed.  Do not have throw rugs and other things on the floor that can make you trip. WHAT CAN I DO IN THE KITCHEN?  Clean up any spills right away.  Avoid walking on wet floors.  Keep items that you use a lot in easy-to-reach places.  If you need to reach something above you, use  a strong step stool that has a grab bar.  Keep electrical cords out of the way.  Do not use floor polish or wax that makes floors slippery. If you must use wax, use non-skid floor wax.  Do not have throw rugs and other things on the floor that can make you trip. WHAT CAN I DO WITH MY STAIRS?  Do not leave any items on the stairs.  Make sure that there are handrails on both sides of the stairs and use them. Fix handrails that are broken or loose. Make sure that handrails are as long as the stairways.  Check any carpeting to make sure that it is firmly attached to the stairs. Fix any carpet that is loose or worn.  Avoid having throw rugs at the top or bottom of the stairs. If you do have throw rugs, attach them to the floor with carpet tape.  Make sure that you have a light switch at the top of the stairs and the bottom of the stairs. If you do not have them, ask someone to add them for you. WHAT ELSE CAN I DO TO HELP PREVENT FALLS?  Wear shoes that:  Do not have high heels.  Have rubber bottoms.  Are comfortable and fit you well.  Are closed at the toe. Do not wear sandals.  If you use a stepladder:  Make sure that it is fully opened. Do not climb a closed stepladder.  Make sure that both sides of the stepladder are locked into place.  Ask someone to hold it for you, if possible.  Clearly mark and make sure that you can see:  Any grab bars or handrails.  First and last steps.  Where the edge of each step is.  Use tools that help you move around (mobility aids) if they are needed. These include:  Canes.  Walkers.  Scooters.  Crutches.  Turn on the lights when you go into a dark area. Replace any light bulbs as soon as they burn out.  Set up your furniture so you have a clear path. Avoid moving your furniture around.  If any of your floors are uneven, fix them.  If there are any pets around you, be aware of where they are.  Review your medicines with your  doctor. Some medicines can make you feel dizzy. This can increase your chance of falling. Ask your doctor what other things that you can do to help prevent falls.   This information is not intended to replace advice given to you by your health care provider. Make sure you discuss any questions you have with your health care provider.  Document Released: 02/12/2009 Document Revised: 09/02/2014 Document Reviewed: 05/23/2014 Elsevier Interactive Patient Education 2016 [place] Maintenance, Male A healthy lifestyle and preventative care can promote health and wellness.  Maintain regular health, dental, and eye exams.  Eat a healthy diet. Foods like vegetables, fruits, whole grains, low-fat dairy products, and lean protein foods contain the nutrients you need and are low in calories. Decrease your intake of foods high in solid fats, added sugars, and salt. Get information about a proper diet from your health care provider, if necessary.  Regular physical exercise is one of the most important things you can do for your health. Most adults should get at least 150 minutes of moderate-intensity exercise (any activity that increases your heart rate and causes you to sweat) each week. In addition, most adults need muscle-strengthening exercises on 2 or more days a week.   Maintain a healthy weight. The body mass index (BMI) is a screening tool to identify possible weight problems. It provides an estimate of body fat based on height and weight. Your health care provider can find your BMI and can help you achieve or maintain a healthy weight. For males 20 years and older:  A BMI below 18.5 is considered underweight.  A BMI of 18.5 to 24.9 is normal.  A BMI of 25 to 29.9 is considered overweight.  A BMI of 30 and above is considered obese.  Maintain normal blood lipids and cholesterol by exercising and minimizing your intake of saturated fat. Eat a balanced diet with plenty of fruits and  vegetables. Blood tests for lipids and cholesterol should begin at age 67 and be repeated every 5 years. If your lipid or cholesterol levels are high, you are over age 31, or you are at high risk for heart disease, you may need your cholesterol levels checked more frequently.Ongoing high lipid and cholesterol levels should be treated with medicines if diet and exercise are not working.  If you smoke, find out from your health care provider how to quit. If you do not use tobacco, do not start.  Lung cancer screening is recommended for adults aged 43-80 years who are at high risk for developing lung cancer because of a history of smoking. A yearly low-dose CT scan of the lungs is recommended for people who have at least a 30-pack-year history of smoking and are current smokers or have quit within the past 15 years. A pack year of smoking is smoking an average of 1 pack of cigarettes a day for 1 year (for example, a 30-pack-year history of smoking could mean smoking 1 pack a day for 30 years or 2 packs a day for 15 years). Yearly screening should continue until the smoker has stopped smoking for at least 15 years. Yearly screening should be stopped for people who develop a health problem that would prevent them from having lung cancer treatment.  If you choose to drink alcohol, do not have more than 2 drinks per day. One drink is considered to be 12 oz (360 mL) of beer, 5 oz (150 mL) of wine, or 1.5 oz (45 mL) of liquor.  Avoid the use of street drugs. Do not share needles with anyone. Ask for help if you need support or instructions about stopping the use of drugs.  High blood pressure causes heart disease and increases the risk of stroke. High blood pressure is more likely to develop in:  People who have blood pressure in the end of the normal range (  100-139/85-89 mm Hg).  People who are overweight or obese.  People who are African American.  If you are 53-6 years of age, have your blood pressure  checked every 3-5 years. If you are 65 years of age or older, have your blood pressure checked every year. You should have your blood pressure measured twice--once when you are at a hospital or clinic, and once when you are not at a hospital or clinic. Record the average of the two measurements. To check your blood pressure when you are not at a hospital or clinic, you can use:  An automated blood pressure machine at a pharmacy.  A home blood pressure monitor.  If you are 18-17 years old, ask your health care provider if you should take aspirin to prevent heart disease.  Diabetes screening involves taking a blood sample to check your fasting blood sugar level. This should be done once every 3 years after age 28 if you are at a normal weight and without risk factors for diabetes. Testing should be considered at a younger age or be carried out more frequently if you are overweight and have at least 1 risk factor for diabetes.  Colorectal cancer can be detected and often prevented. Most routine colorectal cancer screening begins at the age of 72 and continues through age 27. However, your health care provider may recommend screening at an earlier age if you have risk factors for colon cancer. On a yearly basis, your health care provider may provide home test kits to check for hidden blood in the stool. A small camera at the end of a tube may be used to directly examine the colon (sigmoidoscopy or colonoscopy) to detect the earliest forms of colorectal cancer. Talk to your health care provider about this at age 37 when routine screening begins. A direct exam of the colon should be repeated every 5-10 years through age 27, unless early forms of precancerous polyps or small growths are found.  People who are at an increased risk for hepatitis B should be screened for this virus. You are considered at high risk for hepatitis B if:  You were born in a country where hepatitis B occurs often. Talk with your  health care provider about which countries are considered high risk.  Your parents were born in a high-risk country and you have not received a shot to protect against hepatitis B (hepatitis B vaccine).  You have HIV or AIDS.  You use needles to inject street drugs.  You live with, or have sex with, someone who has hepatitis B.  You are a man who has sex with other men (MSM).  You get hemodialysis treatment.  You take certain medicines for conditions like cancer, organ transplantation, and autoimmune conditions.  Hepatitis C blood testing is recommended for all people born from [birth year] through 1965 and any individual with known risk factors for hepatitis C.  Healthy men should no longer receive prostate-specific antigen (PSA) blood tests as part of routine cancer screening. Talk to your health care provider about prostate cancer screening.  Testicular cancer screening is not recommended for adolescents or adult males who have no symptoms. Screening includes self-exam, a health care provider exam, and other screening tests. Consult with your health care provider about any symptoms you have or any concerns you have about testicular cancer.  Practice safe sex. Use condoms and avoid high-risk sexual practices to reduce the spread of sexually transmitted infections (STIs).  You should be screened for STIs, including  gonorrhea and chlamydia if:  You are sexually active and are younger than 24 years.  You are older than 24 years, and your health care provider tells you that you are at risk for this type of infection.  Your sexual activity has changed since you were last screened, and you are at an increased risk for chlamydia or gonorrhea. Ask your health care provider if you are at risk.  If you are at risk of being infected with HIV, it is recommended that you take a prescription medicine daily to prevent HIV infection. This is called pre-exposure prophylaxis (PrEP). You are considered at  risk if:  You are a man who has sex with other men (MSM).  You are a heterosexual man who is sexually active with multiple partners.  You take drugs by injection.  You are sexually active with a partner who has HIV.  Talk with your health care provider about whether you are at high risk of being infected with HIV. If you choose to begin PrEP, you should first be tested for HIV. You should then be tested every 3 months for as long as you are taking PrEP.  Use sunscreen. Apply sunscreen liberally and repeatedly throughout the day. You should seek shade when your shadow is shorter than you. Protect yourself by wearing long sleeves, pants, a wide-brimmed hat, and sunglasses year round whenever you are outdoors.  Tell your health care provider of new moles or changes in moles, especially if there is a change in shape or color. Also, tell your health care provider if a mole is larger than the size of a pencil eraser.  A one-time screening for abdominal aortic aneurysm (AAA) and surgical repair of large AAAs by ultrasound is recommended for men aged 72-75 years who are current or former smokers.  Stay current with your vaccines (immunizations).   This information is not intended to replace advice given to you by your health care provider. Make sure you discuss any questions you have with your health care provider.   Document Released: 10/15/2007 Document Revised: 05/09/2014 Document Reviewed: 09/13/2010 Elsevier Interactive Patient Education [employer].

## 2016-02-21 ENCOUNTER — Encounter: Payer: Self-pay | Admitting: Family Medicine

## 2016-02-23 ENCOUNTER — Encounter: Payer: Self-pay | Admitting: Family Medicine

## 2016-02-23 ENCOUNTER — Ambulatory Visit (INDEPENDENT_AMBULATORY_CARE_PROVIDER_SITE_OTHER): Payer: Medicare Other | Admitting: Family Medicine

## 2016-02-23 DIAGNOSIS — G4733 Obstructive sleep apnea (adult) (pediatric): Secondary | ICD-10-CM

## 2016-02-23 NOTE — Progress Notes (Signed)
Subjective:  Jarquise Higgs is a 75 y.o. year old very pleasant male patient who presents for/with See problem oriented charting ROS- no daytimes sleepiness. No snoring. No chest pain or shortness of breath. .see any ROS included in HPI as well.   Past Medical History-  Patient Active Problem List   Diagnosis Date Noted  . BPH (benign prostatic hyperplasia) 07/02/2015    Priority: High  . Diabetes mellitus type II, uncontrolled ([place]) 04/23/2007    Priority: High  . Hyperlipidemia 04/23/2007    Priority: Medium  . Obstructive sleep apnea 04/23/2007    Priority: Medium  . Essential hypertension 04/23/2007    Priority: Medium  . Sepsis secondary to UTI ([place]) 03/29/2015    Priority: Low  . Hallux valgus of right foot 03/26/2014    Priority: Low  . History of colon cancer 01/27/2014    Priority: Low  . IRITIS 12/17/2008    Priority: Low  . 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 BIGEMINY 04/21/2008    Priority: Low    Medications- reviewed and updated Current Outpatient Prescriptions  Medication Sig Dispense Refill  . amLODipine (NORVASC) 5 MG tablet TAKE 1 TABLET (5 MG TOTAL) BY MOUTH DAILY. 90 tablet 3  . aspirin 81 MG tablet Take 81 mg by mouth daily.    Marland Kitchen atorvastatin (LIPITOR) 20 MG tablet TAKE 1 TABLET BY MOUTH ONCE PER WEEK (Patient taking differently: TAKE 1 TABLET BY MOUTH ONCE PER WEEK on Sundays) 52 tablet 2  . glimepiride (AMARYL) 4 MG tablet Take 2 tablets (8 mg total) by mouth daily before breakfast. 180 tablet 1  . glucose blood (ONE TOUCH ULTRA TEST) test strip Use as instructed to check blood sugar once a day 100 each 1  . metFORMIN (GLUCOPHAGE) 1000 MG tablet TAKE 1 TABLET (1,000 MG TOTAL) BY MOUTH 2 (TWO) TIMES DAILY WITH A MEAL. 180 tablet 2  . Multiple Vitamins-Minerals (CENTRUM SILVER PO) Take 1 tablet by mouth daily.     . sitaGLIPtin (JANUVIA) 100 MG tablet Take 1 tablet (100 mg  total) by mouth daily. 90 tablet 1  . telmisartan (MICARDIS) 40 MG tablet TAKE 1 TABLET (40 MG TOTAL) BY MOUTH DAILY. 90 tablet 3   No current facility-administered medications for this visit.     Objective: BP 120/70 (BP Location: Left Arm, Patient Position: Sitting, Cuff Size: Large)   Pulse 79   Temp 98.3 F (36.8 C) (Oral)   Wt 210 lb (95.3 kg)   SpO2 95%   BMI 28.48 kg/m  Gen: NAD, resting comfortably, appears well rested Marks of cpap machine imprint on face CV: RRR no murmurs rubs or gallops Lungs: CTAB no crackles, wheeze, rhonchi  Ext: no edema  Assessment/Plan:  Obstructive sleep apnea S: got new cpap machine at beginning of September 2016. Did not want to take new machine on his trip, got back early October 2016  and started with new machine but was told noncompliant with use because didn't use in September of 2016- due to this he has been refused getting a new mask. He had told Jonni Sanger of [facility] that he would not be using machine during trip in 2016 because did not want to risk losing it during travel- instead he used his old machine that does not have a sim card (therefore looked like he was not using new machine). Needs new mask because old mask has started to leak/whistle  and not fitting as well anymore.   With CPAP machine- Clinical improvement includes no daytime sleepiness and no snoring- prior issues were difficulty with sleep. Patient is using his machine >6 hours a night everynight. He has discussed this with [facility] and they simply needed a note from me stating so.   O: imprints on his face from cpap use last night noted  A/P: send this note to [facility]. Patient has been compliant at all times over last year but only issue has been which machine he is using. The one he used for period of reported noncompliance did not have a sim card. He has physical evidence today of use and has been told by [facility] that he is using >6 hours everynight.    The duration of face-to-face time during this visit was greater than 15 minutes. Greater than 50% of this time was spent in counseling about frustration over insurance issues with Cpap machine, discussion of benefits of cpap  Return precautions advised.  [name], MD

## 2016-02-23 NOTE — Progress Notes (Signed)
Pre visit review using our clinic review tool, if applicable. No additional management support is needed unless otherwise documented below in the visit note. 

## 2016-02-23 NOTE — Assessment & Plan Note (Signed)
S: got new cpap machine at beginning of September 2016. Did not want to take new machine on his trip, got back early October 2016  and started with new machine but was told noncompliant with use because didn't use in September of 2016- due to this he has been refused getting a new mask. He had told [facility]ni Sanger of [facility] that he would not be using machine during trip in 2016 because did not want to risk losing it during travel- instead he used his old machine that does not have a sim card (therefore looked like he was not using new machine). Needs new mask because old mask has started to leak/whistle and not fitting as well anymore.   With CPAP machine- Clinical improvement includes no daytime sleepiness and no snoring- prior issues were difficulty with sleep. Patient is using his machine >6 hours a night everynight. He has discussed this with [facility] and they simply needed a note from me stating so.   O: imprints on his face from cpap use last night noted  A/P: send this note to [facility]. Patient has been compliant at all times over last year but only issue has been which machine he is using. The one he used for period of reported noncompliance did not have a sim card. He has physical evidence today of use and has been told by [facility] that he is using >6 hours everynight.

## 2016-02-24 ENCOUNTER — Telehealth: Payer: Self-pay

## 2016-02-24 NOTE — Telephone Encounter (Signed)
Call to Dr. Elder Negus office To fax eye report to [facility] at [phone] and will fax today

## 2016-02-26 ENCOUNTER — Encounter: Payer: Self-pay | Admitting: Family Medicine

## 2016-03-01 ENCOUNTER — Encounter: Payer: Self-pay | Admitting: Family Medicine

## 2016-03-25 ENCOUNTER — Other Ambulatory Visit: Payer: Self-pay | Admitting: Family Medicine

## 2016-03-31 ENCOUNTER — Other Ambulatory Visit: Payer: Self-pay | Admitting: Family Medicine

## 2016-05-01 ENCOUNTER — Other Ambulatory Visit: Payer: Self-pay | Admitting: Family Medicine

## 2016-05-07 ENCOUNTER — Other Ambulatory Visit: Payer: Self-pay | Admitting: Family Medicine

## 2016-05-10 ENCOUNTER — Encounter: Payer: Self-pay | Admitting: Family Medicine

## 2016-05-10 ENCOUNTER — Ambulatory Visit (INDEPENDENT_AMBULATORY_CARE_PROVIDER_SITE_OTHER): Payer: Medicare Other | Admitting: Family Medicine

## 2016-05-10 VITALS — BP 130/78 | HR 81 | Temp 98.3°F | Wt 218.2 lb

## 2016-05-10 DIAGNOSIS — E785 Hyperlipidemia, unspecified: Secondary | ICD-10-CM | POA: Diagnosis not present

## 2016-05-10 DIAGNOSIS — I10 Essential (primary) hypertension: Secondary | ICD-10-CM

## 2016-05-10 DIAGNOSIS — E119 Type 2 diabetes mellitus without complications: Secondary | ICD-10-CM

## 2016-05-10 LAB — COMPREHENSIVE METABOLIC PANEL
ALT: 25 U/L (ref 0–53)
AST: 16 U/L (ref 0–37)
Albumin: 4.4 g/dL (ref 3.5–5.2)
Alkaline Phosphatase: 76 U/L (ref 39–117)
BILIRUBIN TOTAL: 0.4 mg/dL (ref 0.2–1.2)
BUN: 18 mg/dL (ref 6–23)
CO2: 30 meq/L (ref 19–32)
CREATININE: 1 mg/dL (ref 0.40–1.50)
Calcium: 10.1 mg/dL (ref 8.4–10.5)
Chloride: 98 mEq/L (ref 96–112)
GFR: 77.33 mL/min (ref >60.00)
GLUCOSE: 249 mg/dL — AB (ref 70–99)
Potassium: 4.7 mEq/L (ref 3.5–5.1)
Sodium: 137 mEq/L (ref 135–145)
Total Protein: 7.2 g/dL (ref 6.0–8.3)

## 2016-05-10 LAB — CBC
HCT: 41.3 % (ref 39.0–52.0)
Hemoglobin: 14.4 g/dL (ref 13.0–17.0)
MCHC: 34.8 g/dL (ref 30.0–36.0)
MCV: 90.3 fl (ref 78.0–100.0)
Platelets: 201 10*3/uL (ref 150.0–400.0)
RBC: 4.58 Mil/uL (ref 4.22–5.81)
RDW: 14 % (ref 11.5–15.5)
WBC: 7.9 10*3/uL (ref 4.0–10.5)

## 2016-05-10 LAB — HEMOGLOBIN A1C: Hgb A1c MFr Bld: 7.9 % — ABNORMAL HIGH (ref 4.6–6.5)

## 2016-05-10 LAB — LDL CHOLESTEROL, DIRECT: Direct LDL: 93 mg/dL

## 2016-05-10 NOTE — Patient Instructions (Signed)
Labs before you leave  Do exercises for plantar fascia at least twice a week and would even consider the stretches daily

## 2016-05-10 NOTE — Assessment & Plan Note (Signed)
S: well controlled. On metformin 1g BID, amaryl 8mg , januvia. Polyuria and sepsis with UTI in past- invokana not ideal.  CBGs- 153 this AM, afternoons usually 120. Weight up 10 lbs from last vist.  Exercise and diet- weight trending up. Started back walking a week ago but had been closing in on 5 previously.  Lab Results  Component Value Date   HGBA1C 6.7 01/05/2016   HGBA1C 8.8 (H) 10/02/2015   HGBA1C 8.9 (H) 04/07/2015   A/P: update a1c, hopeful this hasnt trended up significantly with weight gain

## 2016-05-10 NOTE — Assessment & Plan Note (Signed)
S: well controlled on atorvastatin 20mg  once a week. No myalgias.  Lab Results  Component Value Date   CHOL 138 06/29/2015   HDL 41.30 06/29/2015   LDLCALC 67 06/29/2015   TRIG 149.0 06/29/2015   CHOLHDL 3 06/29/2015   A/P: update direct LDL today

## 2016-05-10 NOTE — Assessment & Plan Note (Addendum)
S: controlled on amlodipine 5mg , telmisartan 40mg  .  BP Readings from Last 3 Encounters:  05/10/16 130/78  02/23/16 120/70  02/19/16 120/80  A/P:Continue current meds:  Doing well

## 2016-05-10 NOTE — Progress Notes (Signed)
Subjective:  Walter [name] is a 76 y.o. year old very pleasant male patient who presents for/with See problem oriented charting ROS- No chest pain or shortness of breath. No headache or blurry vision. Does have some left heel pain   Past Medical History-  Patient Active Problem List   Diagnosis Date Noted  . BPH (benign prostatic hyperplasia) 07/02/2015    Priority: High  . Diabetes mellitus type II, uncontrolled ([place]) 04/23/2007    Priority: High  . Hyperlipidemia 04/23/2007    Priority: Medium  . Obstructive sleep apnea 04/23/2007    Priority: Medium  . Essential hypertension 04/23/2007    Priority: Medium  . Sepsis secondary to UTI ([place]) 03/29/2015    Priority: Low  . Hallux valgus of right foot 03/26/2014    Priority: Low  . History of colon cancer 01/27/2014    Priority: Low  . IRITIS 12/17/2008    Priority: Low  . 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 BIGEMINY 04/21/2008    Priority: Low    Medications- reviewed and updated Current Outpatient Prescriptions  Medication Sig Dispense Refill  . amLODipine (NORVASC) 5 MG tablet TAKE 1 TABLET (5 MG TOTAL) BY MOUTH DAILY. 90 tablet 3  . aspirin 81 MG tablet Take 81 mg by mouth daily.    Marland Kitchen atorvastatin (LIPITOR) 20 MG tablet TAKE 1 TABLET BY MOUTH ONCE PER WEEK (Patient taking differently: TAKE 1 TABLET BY MOUTH ONCE PER WEEK on Sundays) 52 tablet 2  . glimepiride (AMARYL) 4 MG tablet TAKE 2 TABLETS BY MOUTH BEFORE BREAKFAST 180 tablet 1  . glucose blood (ONE TOUCH ULTRA TEST) test strip Use as instructed to check blood sugar once a day 100 each 1  . JANUVIA 100 MG tablet TAKE 1 TABLET EVERY DAY 90 tablet 1  . metFORMIN (GLUCOPHAGE) 1000 MG tablet TAKE 1 TABLET (1,000 MG TOTAL) BY MOUTH 2 (TWO) TIMES DAILY WITH A MEAL. 180 tablet 1  . Multiple Vitamins-Minerals (CENTRUM SILVER PO) Take 1 tablet by mouth daily.     Marland Kitchen telmisartan (MICARDIS) 40 MG  tablet TAKE 1 TABLET (40 MG TOTAL) BY MOUTH DAILY. 90 tablet 3   No current facility-administered medications for this visit.     Objective: BP 130/78   Pulse 81   Temp 98.3 F (36.8 C) (Oral)   Wt 218 lb 3.2 oz (99 kg)   SpO2 96%   BMI 29.59 kg/m  Gen: NAD, resting comfortably CV: RRR no murmurs rubs or gallops Lungs: CTAB no crackles, wheeze, rhonchi  Ext: no edema Skin: warm, dry, no rash Neuro: grossly normal, moves all extremities Msk: pain at insertion point of plantar fascia with deep palpation.   Assessment/Plan:  Diabetes mellitus type II, uncontrolled ([name]) S: well controlled. On metformin 1g BID, amaryl 8mg , januvia. Polyuria and sepsis with UTI in past- invokana not ideal.  CBGs- 153 this AM, afternoons usually 120. Weight up 10 lbs from last vist.  Exercise and diet- weight trending up. Started back walking a week ago but had been closing in on 5 previously.  Lab Results  Component Value Date   HGBA1C 6.7 01/05/2016   HGBA1C 8.8 (H) 10/02/2015   HGBA1C 8.9 (H) 04/07/2015   A/P: update a1c, hopeful this hasnt trended up significantly with weight gain  Essential hypertension S: controlled on amlodipine 5mg , telmisartan 40mg  .  BP Readings from Last 3 Encounters:  05/10/16 130/78  02/23/16 120/70  02/19/16 120/80  A/P:Continue current meds:  Doing well  Hyperlipidemia S: well controlled on atorvastatin 20mg  once a week. No myalgias.  Lab Results  Component Value Date   CHOL 138 06/29/2015   HDL 41.30 06/29/2015   LDLCALC 67 06/29/2015   TRIG 149.0 06/29/2015   CHOLHDL 3 06/29/2015   A/P: update direct LDL today  Plantar fasciitis on left foot- very mild for a few weeks. Sometimes doesn't bother him for a day or two. Wearing gel supports.  - gave home exercise program handout with advisement 2x a week for all but stretches daily as they have helped him.   Return in about 4 months (around 09/07/2016) for physical.  Orders Placed This Encounter   Procedures  . CBC    [place]  . Comprehensive metabolic panel    [place]  . Hemoglobin A1c    [place]  . LDL cholesterol, direct    [place]   Return precautions advised.  [name], MD

## 2016-05-10 NOTE — Progress Notes (Signed)
Pre visit review using our clinic review tool, if applicable. No additional management support is needed unless otherwise documented below in the visit note. 

## 2016-06-10 ENCOUNTER — Ambulatory Visit (INDEPENDENT_AMBULATORY_CARE_PROVIDER_SITE_OTHER): Payer: Medicare Other | Admitting: Family Medicine

## 2016-06-10 ENCOUNTER — Encounter: Payer: Self-pay | Admitting: Family Medicine

## 2016-06-10 VITALS — BP 120/60 | HR 86 | Temp 99.4°F | Ht 72.0 in | Wt 215.0 lb

## 2016-06-10 DIAGNOSIS — J101 Influenza due to other identified influenza virus with other respiratory manifestations: Secondary | ICD-10-CM

## 2016-06-10 DIAGNOSIS — R509 Fever, unspecified: Secondary | ICD-10-CM

## 2016-06-10 LAB — POCT URINALYSIS DIPSTICK
Bilirubin, UA: NEGATIVE
Leukocytes, UA: NEGATIVE
Nitrite, UA: NEGATIVE
PH UA: 5.5
RBC UA: NEGATIVE
Urobilinogen, UA: 0.2

## 2016-06-10 MED ORDER — OSELTAMIVIR PHOSPHATE 75 MG PO CAPS: 75.0000 mg | ORAL_CAPSULE | Freq: Two times a day (BID) | ORAL | 0 refills | Status: DC

## 2016-06-10 NOTE — Progress Notes (Signed)
Pre visit review using our clinic review tool, if applicable. No additional management support is needed unless otherwise documented below in the visit note. 

## 2016-06-10 NOTE — Progress Notes (Signed)
Subjective:     Patient ID: Walter [name], male   DOB: 1940/07/28, 75 y.o.   MRN: [id number]  HPI Acute visit for febrile illness. Wednesday night noted a little bit of cough but did not feel particularly sick until yesterday. He developed some body aches, fatigue, chills, cough. Denies any sore throat or headache. Fevers up to 102.7 last night. Denies any nausea, vomiting, or diarrhea. Patient was concerned because of complicated UTI last year. However, he has not had any burning with urination whatsoever currently. He wants to have urine checked nevertheless. He did have flu vaccine.  Past Medical History:  Diagnosis Date  . Colon cancer ([place]) 1992   colon resection 1992  . Complication of anesthesia    bladder problems after surgery from Anesthesia- can not urinate  . Diabetes mellitus   . History of shingles   . Hyperlipidemia   . Hypertension   . Sleep apnea    cpap   Past Surgical History:  Procedure Laterality Date  . APPENDECTOMY  1949  . COLECTOMY  1992  . CYSTOSCOPY N/A 06/21/2015   Procedure: CYSTOSCOPY, CLOT EVACUATION, AND CAUTERIZATION OF PROSTATE FOSSA;  Surgeon: Carolan Clines, MD;  Location: [facility];  Service: Urology;  Laterality: N/A;  . HEMORRHOID SURGERY    . TRANSURETHRAL RESECTION OF PROSTATE N/A 05/23/2015   Procedure: TRANSURETHRAL RESECTION OF THE PROSTATE (TURP);  Surgeon: Irine Seal, MD;  Location: [facility];  Service: Urology;  Laterality: N/A;    reports that he has never smoked. He has never used smokeless tobacco. He reports that he drinks about 1.2 oz of alcohol per week . He reports that he does not use drugs. family history includes Alzheimer's disease in his mother; Colon cancer (age of onset: 26) in his paternal grandmother; Colon cancer (age of onset: 69) in his father; Diabetes in his father; Rectal cancer in his maternal uncle. Allergies  Allergen Reactions  . Bactrim [Sulfamethoxazole-Trimethoprim] Nausea And Vomiting    Pt has chills and fevers  also.     Review of Systems  Constitutional: Positive for chills, fatigue and fever.  HENT: Positive for congestion.   Respiratory: Positive for cough.   Cardiovascular: Negative for chest pain.  Neurological: Negative for headaches.       Objective:   Physical Exam  Constitutional: He appears well-developed and well-nourished.  HENT:  Right Ear: External ear normal.  Left Ear: External ear normal.  Mouth/Throat: Oropharynx is clear and moist.  Neck: Neck supple.  Cardiovascular: Normal rate and regular rhythm.   Pulmonary/Chest: Effort normal and breath sounds normal. No respiratory distress. He has no wheezes. He has no rales.  Musculoskeletal: He exhibits no edema.       Assessment:     Influenza. Positive influenza screen. Patient nontoxic in appearance.  Urine dip no nitrites or leukocytes..    Plan:     -Tamiflu 75 mg twice a day for 5 days -Plenty of fluids -Tylenol as needed for fever and body aches -Follow-up immediately for any increased dyspnea or other concerns  Eulas Post MD [facility]

## 2016-06-10 NOTE — Patient Instructions (Signed)

## 2016-08-02 ENCOUNTER — Other Ambulatory Visit: Payer: Self-pay | Admitting: Family Medicine

## 2016-09-05 ENCOUNTER — Encounter: Payer: Self-pay | Admitting: Family Medicine

## 2016-09-05 ENCOUNTER — Ambulatory Visit (INDEPENDENT_AMBULATORY_CARE_PROVIDER_SITE_OTHER): Payer: Medicare Other | Admitting: Family Medicine

## 2016-09-05 VITALS — BP 140/86 | HR 84 | Temp 98.6°F | Ht 72.0 in | Wt 213.4 lb

## 2016-09-05 DIAGNOSIS — E11621 Type 2 diabetes mellitus with foot ulcer: Secondary | ICD-10-CM

## 2016-09-05 DIAGNOSIS — L97522 Non-pressure chronic ulcer of other part of left foot with fat layer exposed: Secondary | ICD-10-CM

## 2016-09-05 MED ORDER — DOXYCYCLINE HYCLATE 100 MG PO TABS
100.0000 mg | ORAL_TABLET | Freq: Two times a day (BID) | ORAL | 0 refills | Status: DC
Start: 2016-09-05 — End: 2016-09-09

## 2016-09-05 NOTE — Progress Notes (Signed)
Pre visit review using our clinic review tool, if applicable. No additional management support is needed unless otherwise documented below in the visit note. 

## 2016-09-05 NOTE — Patient Instructions (Addendum)
Start doxycycline antibiotic  Trying to have you see Dr. Sharol Given or Dr. Erlinda Hong tomorrow  Please stop by lab before you go

## 2016-09-05 NOTE — Progress Notes (Signed)
Subjective:  Walter [name] is a 76 y.o. year old very pleasant male patient who presents for/with See problem oriented charting ROS- pain in toe, redness and swelling in toe noted. No fever or chills. Does not fill ill overall. Does have some edema onto leg.    Past Medical History-  Patient Active Problem List   Diagnosis Date Noted  . BPH (benign prostatic hyperplasia) 07/02/2015    Priority: High  . Diabetes mellitus type II, controlled ([place]) 04/23/2007    Priority: High  . Hyperlipidemia 04/23/2007    Priority: Medium  . Obstructive sleep apnea 04/23/2007    Priority: Medium  . Essential hypertension 04/23/2007    Priority: Medium  . Sepsis secondary to UTI ([place]) 03/29/2015    Priority: Low  . Hallux valgus of right foot 03/26/2014    Priority: Low  . History of colon cancer 01/27/2014    Priority: Low  . IRITIS 12/17/2008    Priority: Low  . 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 BIGEMINY 04/21/2008    Priority: Low    Medications- reviewed and updated Current Outpatient Prescriptions  Medication Sig Dispense Refill  . amLODipine (NORVASC) 5 MG tablet TAKE 1 TABLET (5 MG TOTAL) BY MOUTH DAILY. 90 tablet 3  . aspirin 81 MG tablet Take 81 mg by mouth daily.    Marland Kitchen atorvastatin (LIPITOR) 20 MG tablet TAKE 1 TABLET ONCE A WEEK 52 tablet 2  . glimepiride (AMARYL) 4 MG tablet TAKE 2 TABLETS BY MOUTH BEFORE BREAKFAST 180 tablet 1  . glucose blood (ONE TOUCH ULTRA TEST) test strip Use as instructed to check blood sugar once a day 100 each 1  . JANUVIA 100 MG tablet TAKE 1 TABLET EVERY DAY 90 tablet 1  . metFORMIN (GLUCOPHAGE) 1000 MG tablet TAKE 1 TABLET (1,000 MG TOTAL) BY MOUTH 2 (TWO) TIMES DAILY WITH A MEAL. 180 tablet 1  . Multiple Vitamins-Minerals (CENTRUM SILVER PO) Take 1 tablet by mouth daily.     Marland Kitchen telmisartan (MICARDIS) 40 MG tablet TAKE 1 TABLET (40 MG TOTAL) BY MOUTH DAILY. 90 tablet 3    Objective: BP 140/86 (BP Location: Left Arm, Patient Position: Sitting, Cuff Size: Large)   Pulse 84   Temp 98.6 F (37 C) (Oral)   Ht 6' (1.829 m)   Wt 213 lb 6.4 oz (96.8 kg)   SpO2 95%   BMI 28.94 kg/m  Gen: NAD, resting comfortably CV: RRR  Lungs: CTAB Ext: trace edema on right ankle, none on left Right 2nd toe with significant ulceration with visible fat. There seems to be some tunneling into toe as well. With pressure on toe, clear discharge noted. There is a foul smell to the toe.  Skin: warm, dry with erythema extending up the foot.  Neuro: intact distal sensation on 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     Assessment/Plan:  Diabetic ulcer of toe of left foot associated with type 2 diabetes mellitus, with fat layer exposed ([place]) - Plan: Ambulatory referral to Orthopedics, CBC with Differential/Platelet, Basic metabolic panel, Sedimentation rate, Hemoglobin A1c S: Patient states about a month ago he noted what he thought was a corn or callous in medial aspect of 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 at DIP joint. He tried some corn pads over the counter and states the area turned to yellow/white appearance- he just thought this was some dead skin. He had been to [place] and was walking around on sand on  this without pain or discomfort. Over the last 3 days, he noted an opening and has had discharge and pain since that time. Walking on the foot has started to hurt. He also noted the hole growing in size.  A/P: I am very concerned that this diabetic foot ulcer represents osteomyelitis. Today, also appears to have cellulitis of the foot- will start doxycycline. Grateful to [place] who will see patient tomorrow morning and can get films quicker than we could today. I told patient I was concerned he may end up losing this toe and that this was a strong possibility but we would await expertise of Dr. Erlinda Hong. Will update a1c. Get CBC diff and sed rate as well as updated bmet.   In regards to surgery, able to complete 4 METS  (walking 3.5 miles a day even recently) without chest pain or shortness of breath.  Should not need further cardiac clerance  Orders Placed This Encounter  Procedures  . CBC with Differential/Platelet  . Basic metabolic panel    [place]  . Sedimentation rate    [place]  . Hemoglobin A1c      . Ambulatory referral to Orthopedics    Referral Priority:   Urgent    Referral Type:   Consultation    Referred to Provider:   Newt Minion, MD    Meds ordered this encounter  Medications  . doxycycline (VIBRA-TABS) 100 MG tablet    Sig: Take 1 tablet (100 mg total) by mouth 2 (two) times daily.    Dispense:  14 tablet    Refill:  0    Return precautions advised.  [name], MD

## 2016-09-06 ENCOUNTER — Ambulatory Visit (INDEPENDENT_AMBULATORY_CARE_PROVIDER_SITE_OTHER): Payer: Medicare Other | Admitting: Orthopaedic Surgery

## 2016-09-06 ENCOUNTER — Ambulatory Visit (INDEPENDENT_AMBULATORY_CARE_PROVIDER_SITE_OTHER): Payer: Medicare Other

## 2016-09-06 ENCOUNTER — Ambulatory Visit (INDEPENDENT_AMBULATORY_CARE_PROVIDER_SITE_OTHER): Payer: Self-pay

## 2016-09-06 ENCOUNTER — Encounter (INDEPENDENT_AMBULATORY_CARE_PROVIDER_SITE_OTHER): Payer: Self-pay | Admitting: Orthopaedic Surgery

## 2016-09-06 ENCOUNTER — Encounter: Payer: Self-pay | Admitting: Family Medicine

## 2016-09-06 DIAGNOSIS — L97529 Non-pressure chronic ulcer of other part of left foot with unspecified severity: Secondary | ICD-10-CM

## 2016-09-06 DIAGNOSIS — L97519 Non-pressure chronic ulcer of other part of right foot with unspecified severity: Secondary | ICD-10-CM | POA: Diagnosis not present

## 2016-09-06 LAB — CBC WITH DIFFERENTIAL/PLATELET
BASOS PCT: 0.6 % (ref 0.0–3.0)
Basophils Absolute: 0.1 10*3/uL (ref 0.0–0.1)
Eosinophils Absolute: 0.2 10*3/uL (ref 0.0–0.7)
Eosinophils Relative: 2.3 % (ref 0.0–5.0)
HCT: 39.8 % (ref 39.0–52.0)
Hemoglobin: 13.8 g/dL (ref 13.0–17.0)
LYMPHS ABS: 1.7 10*3/uL (ref 0.7–4.0)
Lymphocytes Relative: 17.8 % (ref 12.0–46.0)
MCHC: 34.7 g/dL (ref 30.0–36.0)
MCV: 91.7 fl (ref 78.0–100.0)
MONO ABS: 0.9 10*3/uL (ref 0.1–1.0)
MONOS PCT: 9.3 % (ref 3.0–12.0)
NEUTROS PCT: 70 % (ref 43.0–77.0)
Neutro Abs: 6.7 10*3/uL (ref 1.4–7.7)
PLATELETS: 226 10*3/uL (ref 150.0–400.0)
RBC: 4.34 Mil/uL (ref 4.22–5.81)
RDW: 13.9 % (ref 11.5–15.5)
WBC: 9.5 10*3/uL (ref 4.0–10.5)

## 2016-09-06 LAB — BASIC METABOLIC PANEL
BUN: 19 mg/dL (ref 6–23)
CALCIUM: 10 mg/dL (ref 8.4–10.5)
CO2: 29 mEq/L (ref 19–32)
CREATININE: 1.12 mg/dL (ref 0.40–1.50)
Chloride: 102 mEq/L (ref 96–112)
GFR: 67.79 mL/min (ref >60.00)
Glucose, Bld: 88 mg/dL (ref 70–99)
Potassium: 4.2 mEq/L (ref 3.5–5.1)
SODIUM: 140 meq/L (ref 135–145)

## 2016-09-06 LAB — SEDIMENTATION RATE: SED RATE: 22 mm/h — AB (ref 0–20)

## 2016-09-06 LAB — HEMOGLOBIN A1C: HEMOGLOBIN A1C: 8.2 % — AB (ref 4.6–6.5)

## 2016-09-06 NOTE — Progress Notes (Signed)
Office Visit Note   Patient: Walter [name]           Date of Birth: 09-Feb-1941           MRN: [id number] Visit Date: 09/06/2016              Requested by: [name], MD [place], [place] [address] PCP: [name], MD   Assessment & Plan: Visit Diagnoses:  1. Skin ulcer of second toe, right, with unspecified severity ([place])     Plan: MRI to evaluate for possible osteomyelitis. X-ray is concerning for osteomyelitis of the middle phalanx. Continue with doxycycline. Bactroban ointment was added. Follow-up next week to review the MRI.  Follow-Up Instructions: Return in about 1 week (around 09/13/2016).   Orders:  Orders Placed This Encounter  Procedures  . XR Foot Complete Right  . MR Toes Right W/Cm   No orders of the defined types were placed in this encounter.     Procedures: No procedures performed   Clinical Data: No additional findings.   Subjective: Chief Complaint  Patient presents with  . Left 2nd Toe - Pain, Ulcer, Drainage from Incision    Patient is a 76 year old gentleman who comes in with an ulcer of his right second toe.  He originally put a corn pad on there and due to his peripheral neuropathy was not able to feel a developing wound. He presented to his PCP with a red and swollen second toe with drainage. He has been placed on doxycycline. He states the swelling and redness have improved. He is diabetic.    Review of Systems  Constitutional: Negative.   All other systems reviewed and are negative.    Objective: Vital Signs: There were no vitals taken for this visit.  Physical Exam  Constitutional: He is oriented to person, place, and time. He appears well-developed and well-nourished.  HENT:  Head: Normocephalic and atraumatic.  Eyes: Pupils are equal, round, and reactive to light.  Neck: Neck supple.  Pulmonary/Chest: Effort normal.  Abdominal: Soft.  Musculoskeletal: Normal range of motion.  Neurological: He  is alert and oriented to person, place, and time.  Skin: Skin is warm.  Psychiatric: He has a normal mood and affect. His behavior is normal. Judgment and thought content normal.  Nursing note and vitals reviewed.   Ortho Exam Right second toe shows a pressure ulcer on the medial aspect of the toe. There is serosanguineous drainage. The toe is swollen and red. There is no frank pus. Specialty Comments:  No specialty comments available.  Imaging: Xr Foot Complete Right  Result Date: 09/06/2016 [place] of the second toe middle phalanx concerning for osteomyelitis    PMFS History: Patient Active Problem List   Diagnosis Date Noted  . Ulcer of second toe of left foot ([place]) 09/06/2016  . BPH (benign prostatic hyperplasia) 07/02/2015  . Sepsis secondary to UTI ([place]) 03/29/2015  . Hallux valgus of right foot 03/26/2014  . History of colon cancer 01/27/2014  . IRITIS 12/17/2008  . 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 BIGEMINY 04/21/2008  . Diabetes mellitus type II, controlled ([place]) 04/23/2007  . Hyperlipidemia 04/23/2007  . Obstructive sleep apnea 04/23/2007  . Essential hypertension 04/23/2007   Past Medical History:  Diagnosis Date  . Colon cancer ([place]) 1992   colon resection 1992  . Complication of anesthesia    bladder problems after surgery from Anesthesia- can not urinate  . Diabetes mellitus   .  History of shingles   . Hyperlipidemia   . Hypertension   . Sleep apnea    cpap    Family History  Problem Relation Age of Onset  . Colon cancer Father 30  . Diabetes Father     type I  . Alzheimer's disease Mother   . Cancer      colon/fhx  . Rectal cancer Maternal Uncle   . Colon cancer Paternal Grandmother 48    Past Surgical History:  Procedure Laterality Date  . APPENDECTOMY  1949  . COLECTOMY  1992  . CYSTOSCOPY N/A 06/21/2015   Procedure: CYSTOSCOPY, CLOT EVACUATION, AND CAUTERIZATION OF PROSTATE FOSSA;   Surgeon: Carolan Clines, MD;  Location: [facility];  Service: Urology;  Laterality: N/A;  . HEMORRHOID SURGERY    . TRANSURETHRAL RESECTION OF PROSTATE N/A 05/23/2015   Procedure: TRANSURETHRAL RESECTION OF THE PROSTATE (TURP);  Surgeon: Irine Seal, MD;  Location: [facility];  Service: Urology;  Laterality: N/A;   Social History   Occupational History  . Not on file.   Social History Main Topics  . Smoking status: Never Smoker  . Smokeless tobacco: Never Used     Comment: smoked for less that 6 months in 1960  . Alcohol use 1.2 oz/week    2 Cans of beer per week     Comment: 2 cans previously, cut out now to help with diabetes control. rare  . Drug use: No  . Sexual activity: Not on file

## 2016-09-07 ENCOUNTER — Ambulatory Visit: Payer: Medicare Other | Admitting: Family Medicine

## 2016-09-08 ENCOUNTER — Telehealth (INDEPENDENT_AMBULATORY_CARE_PROVIDER_SITE_OTHER): Payer: Self-pay

## 2016-09-08 ENCOUNTER — Encounter: Payer: Self-pay | Admitting: Family Medicine

## 2016-09-08 ENCOUNTER — Other Ambulatory Visit: Payer: Self-pay | Admitting: Family Medicine

## 2016-09-08 NOTE — Telephone Encounter (Signed)
Patients wife called stating that patient will finish his abx for doxy on Sunday but does not have a follow up appt until Tuesday. She is concerned about him having a time frame without antibiotic before his follow up appt. Please call her back to discuss [phone]

## 2016-09-08 NOTE — Telephone Encounter (Signed)
Please advise 

## 2016-09-08 NOTE — Telephone Encounter (Signed)
Refill his doxy 100 bid x 7 datys

## 2016-09-09 MED ORDER — DOXYCYCLINE HYCLATE 100 MG PO TABS: 100.0000 mg | ORAL_TABLET | Freq: Two times a day (BID) | ORAL | 0 refills | Status: DC

## 2016-09-09 NOTE — Telephone Encounter (Signed)
RX SENT INTO PHARM. PT AWARE

## 2016-09-09 NOTE — Addendum Note (Signed)
Addended by: Precious Bard on: 09/09/2016 11:17 AM   Modules accepted: Orders

## 2016-09-11 ENCOUNTER — Ambulatory Visit
Admission: RE | Admit: 2016-09-11 | Discharge: 2016-09-11 | Disposition: A | Discharge status: Home / Self Care | Payer: Medicare Other | Source: Ambulatory Visit | Attending: Orthopaedic Surgery | Admitting: Orthopaedic Surgery

## 2016-09-11 DIAGNOSIS — L97519 Non-pressure chronic ulcer of other part of right foot with unspecified severity: Secondary | ICD-10-CM

## 2016-09-11 MED ORDER — GADOBENATE DIMEGLUMINE 529 MG/ML IV SOLN
20.0000 mL | Freq: Once | INTRAVENOUS | Status: AC | PRN
  Administered 2016-09-11: 20 mL via INTRAVENOUS

## 2016-09-13 ENCOUNTER — Ambulatory Visit (INDEPENDENT_AMBULATORY_CARE_PROVIDER_SITE_OTHER): Payer: Medicare Other | Admitting: Orthopaedic Surgery

## 2016-09-13 ENCOUNTER — Encounter (HOSPITAL_BASED_OUTPATIENT_CLINIC_OR_DEPARTMENT_OTHER): Payer: Self-pay | Admitting: *Deleted

## 2016-09-13 ENCOUNTER — Encounter (INDEPENDENT_AMBULATORY_CARE_PROVIDER_SITE_OTHER): Payer: Self-pay | Admitting: Orthopaedic Surgery

## 2016-09-13 ENCOUNTER — Other Ambulatory Visit (INDEPENDENT_AMBULATORY_CARE_PROVIDER_SITE_OTHER): Payer: Self-pay | Admitting: Orthopaedic Surgery

## 2016-09-13 DIAGNOSIS — L97519 Non-pressure chronic ulcer of other part of right foot with unspecified severity: Secondary | ICD-10-CM

## 2016-09-13 DIAGNOSIS — M86671 Other chronic osteomyelitis, right ankle and foot: Secondary | ICD-10-CM | POA: Diagnosis not present

## 2016-09-13 DIAGNOSIS — M869 Osteomyelitis, unspecified: Secondary | ICD-10-CM

## 2016-09-13 MED ORDER — DOXYCYCLINE HYCLATE 100 MG PO TABS: 100.0000 mg | ORAL_TABLET | Freq: Two times a day (BID) | ORAL | 0 refills | Status: DC

## 2016-09-13 NOTE — Progress Notes (Signed)
Office Visit Note   Patient: Walter [name]           Date of Birth: [birth year]           MRN: [id number] Visit Date: 09/13/2016              Requested by: [name], MD [facility] [address] [place], [place] [address] PCP: [name], MD   Assessment & Plan: Visit Diagnoses:  1. Skin ulcer of second toe, right, with unspecified severity ([place])   2. Chronic osteomyelitis of toe, right ([place])     Plan: MRI findings consistent with osteomyelitis of the distal and middle phalanx. She he also has possible septic flexor tenosynovitis. These findings were reviewed with the patient and recommendation is for total versus partial ray amputation based on operative findings. Continue doxycycline and we'll plan to keep him on doxycycline for 4 weeks afterwards. Questions encouraged and answered. Discussed risks benefits alternatives to surgery. We'll plan on surgery tomorrow.  Follow-Up Instructions: Return for 1 week postop visit.   Orders:  No orders of the defined types were placed in this encounter.  Meds ordered this encounter  Medications  . doxycycline (VIBRA-TABS) 100 MG tablet    Sig: Take 1 tablet (100 mg total) by mouth 2 (two) times daily.    Dispense:  56 tablet    Refill:  0      Procedures: No procedures performed   Clinical Data: No additional findings.   Subjective: Chief Complaint  Patient presents with  . Right Foot - Pain, Follow-up    Patient follows up today to review his MRI of his right foot. He is currently taking doxycycline. He denies any constitutional symptoms. Overall his foot is feeling better.    Review of Systems  Constitutional: Negative.   All other systems reviewed and are negative.    Objective: Vital Signs: There were no vitals taken for this visit.  Physical Exam  Constitutional: He is oriented to person, place, and time. He appears well-developed and well-nourished.  Pulmonary/Chest: Effort normal.  Abdominal: Soft.    Neurological: He is alert and oriented to person, place, and time.  Skin: Skin is warm.  Psychiatric: He has a normal mood and affect. His behavior is normal. Judgment and thought content normal.  Nursing note and vitals reviewed.   Ortho Exam Right second toe exam shows healing draining ulcer. He does have marked swelling and redness. Minimal tenderness. Specialty Comments:  No specialty comments available.  Imaging: No results found.   PMFS History: Patient Active Problem List   Diagnosis Date Noted  . Chronic osteomyelitis of toe, right ([place]) 09/13/2016  . Ulcer of second toe of right foot ([place]) 09/13/2016  . Ulcer of second toe of left foot ([place]) 09/06/2016  . BPH (benign prostatic hyperplasia) 07/02/2015  . Sepsis secondary to UTI ([place]) 03/29/2015  . Hallux valgus of right foot 03/26/2014  . History of colon cancer 01/27/2014  . IRITIS 12/17/2008  . 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 BIGEMINY 04/21/2008  . Diabetes mellitus type II, controlled ([place]) 04/23/2007  . Hyperlipidemia 04/23/2007  . Obstructive sleep apnea 04/23/2007  . Essential hypertension 04/23/2007   Past Medical History:  Diagnosis Date  . Colon cancer ([place]) 1992   colon resection 1992  . Complication of anesthesia    bladder problems after surgery from Anesthesia- can not urinate  . Diabetes mellitus   . History of shingles   . Hyperlipidemia   .  Hypertension   . Sleep apnea    cpap    Family History  Problem Relation Age of Onset  . Colon cancer Father 74  . Diabetes Father        type I  . Alzheimer's disease Mother   . Cancer Unknown        colon/fhx  . Rectal cancer Maternal Uncle   . Colon cancer Paternal Grandmother 39    Past Surgical History:  Procedure Laterality Date  . APPENDECTOMY  1949  . COLECTOMY  1992  . CYSTOSCOPY N/A 06/21/2015   Procedure: CYSTOSCOPY, CLOT EVACUATION, AND CAUTERIZATION OF PROSTATE FOSSA;  Surgeon:  Carolan Clines, MD;  [facility];  Service: Urology;  Laterality: N/A;  . HEMORRHOID SURGERY    . TRANSURETHRAL RESECTION OF PROSTATE N/A 05/23/2015   Procedure: TRANSURETHRAL RESECTION OF THE PROSTATE (TURP);  Surgeon: Irine Seal, MD;  Location[facility];  Service: Urology;  Laterality: N/A;   Social History   Occupational History  . Not on file.   Social History Main Topics  . Smoking status: Never Smoker  . Smokeless tobacco: Never Used     Comment: smoked for less that 6 months in 1960  . Alcohol use 1.2 oz/week    2 Cans of beer per week     Comment: 2 cans previously, cut out now to help with diabetes control. rare  . Drug use: No  . Sexual activity: Not on file

## 2016-09-14 ENCOUNTER — Ambulatory Visit (HOSPITAL_BASED_OUTPATIENT_CLINIC_OR_DEPARTMENT_OTHER): Payer: Medicare Other | Admitting: Anesthesiology

## 2016-09-14 ENCOUNTER — Ambulatory Visit (HOSPITAL_BASED_OUTPATIENT_CLINIC_OR_DEPARTMENT_OTHER)
Admission: RE | Admit: 2016-09-14 | Discharge: 2016-09-14 | Disposition: A | Discharge status: Home / Self Care | Payer: Medicare Other | Source: Ambulatory Visit | Attending: Orthopaedic Surgery | Admitting: Orthopaedic Surgery

## 2016-09-14 ENCOUNTER — Encounter: Payer: Self-pay | Admitting: Family Medicine

## 2016-09-14 ENCOUNTER — Encounter (HOSPITAL_BASED_OUTPATIENT_CLINIC_OR_DEPARTMENT_OTHER)
Admission: RE | Disposition: A | Discharge status: Home / Self Care | Payer: Self-pay | Source: Ambulatory Visit | Attending: Orthopaedic Surgery

## 2016-09-14 ENCOUNTER — Encounter (HOSPITAL_BASED_OUTPATIENT_CLINIC_OR_DEPARTMENT_OTHER): Payer: Self-pay

## 2016-09-14 DIAGNOSIS — M86671 Other chronic osteomyelitis, right ankle and foot: Secondary | ICD-10-CM

## 2016-09-14 DIAGNOSIS — Z79899 Other long term (current) drug therapy: Secondary | ICD-10-CM | POA: Diagnosis not present

## 2016-09-14 DIAGNOSIS — E785 Hyperlipidemia, unspecified: Secondary | ICD-10-CM | POA: Diagnosis not present

## 2016-09-14 DIAGNOSIS — Z8619 Personal history of other infectious and parasitic diseases: Secondary | ICD-10-CM | POA: Diagnosis not present

## 2016-09-14 DIAGNOSIS — I10 Essential (primary) hypertension: Secondary | ICD-10-CM | POA: Diagnosis not present

## 2016-09-14 DIAGNOSIS — Z7984 Long term (current) use of oral hypoglycemic drugs: Secondary | ICD-10-CM | POA: Diagnosis not present

## 2016-09-14 DIAGNOSIS — Z9889 Other specified postprocedural states: Secondary | ICD-10-CM | POA: Diagnosis not present

## 2016-09-14 DIAGNOSIS — Z85038 Personal history of other malignant neoplasm of large intestine: Secondary | ICD-10-CM | POA: Diagnosis not present

## 2016-09-14 DIAGNOSIS — E1169 Type 2 diabetes mellitus with other specified complication: Secondary | ICD-10-CM | POA: Diagnosis present

## 2016-09-14 DIAGNOSIS — Z818 Family history of other mental and behavioral disorders: Secondary | ICD-10-CM | POA: Diagnosis not present

## 2016-09-14 DIAGNOSIS — Z800 Family history of malignant neoplasm of digestive organs: Secondary | ICD-10-CM | POA: Diagnosis not present

## 2016-09-14 DIAGNOSIS — Z882 Allergy status to sulfonamides status: Secondary | ICD-10-CM | POA: Insufficient documentation

## 2016-09-14 DIAGNOSIS — G4730 Sleep apnea, unspecified: Secondary | ICD-10-CM | POA: Insufficient documentation

## 2016-09-14 DIAGNOSIS — Z9049 Acquired absence of other specified parts of digestive tract: Secondary | ICD-10-CM | POA: Diagnosis not present

## 2016-09-14 DIAGNOSIS — Z7982 Long term (current) use of aspirin: Secondary | ICD-10-CM | POA: Diagnosis not present

## 2016-09-14 DIAGNOSIS — M868X7 Other osteomyelitis, ankle and foot: Secondary | ICD-10-CM | POA: Insufficient documentation

## 2016-09-14 DIAGNOSIS — Z833 Family history of diabetes mellitus: Secondary | ICD-10-CM | POA: Diagnosis not present

## 2016-09-14 DIAGNOSIS — M869 Osteomyelitis, unspecified: Secondary | ICD-10-CM

## 2016-09-14 HISTORY — DX: Osteomyelitis, unspecified: M86.9

## 2016-09-14 HISTORY — PX: AMPUTATION TOE: SHX6595

## 2016-09-14 LAB — GLUCOSE, CAPILLARY
GLUCOSE-CAPILLARY: 185 mg/dL — AB (ref 65–99)
Glucose-Capillary: 170 mg/dL — ABNORMAL HIGH (ref 65–99)

## 2016-09-14 SURGERY — AMPUTATION, TOE
Anesthesia: Monitor Anesthesia Care | Site: Foot | Laterality: Right

## 2016-09-14 MED ORDER — LACTATED RINGERS IV SOLN
INTRAVENOUS | Status: DC
  Administered 2016-09-14 (×3): via INTRAVENOUS

## 2016-09-14 MED ORDER — FENTANYL CITRATE (PF) 100 MCG/2ML IJ SOLN
INTRAMUSCULAR | Status: AC
  Filled 2016-09-14: qty 2

## 2016-09-14 MED ORDER — FENTANYL CITRATE (PF) 100 MCG/2ML IJ SOLN: 25.0000 ug | INTRAMUSCULAR | Status: DC | PRN

## 2016-09-14 MED ORDER — CEFAZOLIN SODIUM-DEXTROSE 2-4 GM/100ML-% IV SOLN
2.0000 g | INTRAVENOUS | Status: AC
  Administered 2016-09-14: 2 g via INTRAVENOUS

## 2016-09-14 MED ORDER — PROMETHAZINE HCL 25 MG PO TABS: 25.0000 mg | ORAL_TABLET | Freq: Four times a day (QID) | ORAL | 1 refills | Status: DC | PRN

## 2016-09-14 MED ORDER — OXYCODONE-ACETAMINOPHEN 5-325 MG PO TABS: 1.0000 | ORAL_TABLET | ORAL | 0 refills | Status: DC | PRN

## 2016-09-14 MED ORDER — LIDOCAINE 2% (20 MG/ML) 5 ML SYRINGE
INTRAMUSCULAR | Status: AC
  Filled 2016-09-14: qty 5

## 2016-09-14 MED ORDER — PROPOFOL 500 MG/50ML IV EMUL
INTRAVENOUS | Status: DC | PRN
  Administered 2016-09-14: 75 ug/kg/min via INTRAVENOUS

## 2016-09-14 MED ORDER — ONDANSETRON HCL 4 MG/2ML IJ SOLN
INTRAMUSCULAR | Status: AC
  Filled 2016-09-14: qty 2

## 2016-09-14 MED ORDER — METOCLOPRAMIDE HCL 5 MG/ML IJ SOLN: 10.0000 mg | Freq: Once | INTRAMUSCULAR | Status: DC | PRN

## 2016-09-14 MED ORDER — MIDAZOLAM HCL 2 MG/2ML IJ SOLN
INTRAMUSCULAR | Status: AC
  Filled 2016-09-14: qty 2

## 2016-09-14 MED ORDER — MEPERIDINE HCL 25 MG/ML IJ SOLN: 6.2500 mg | INTRAMUSCULAR | Status: DC | PRN

## 2016-09-14 MED ORDER — BUPIVACAINE-EPINEPHRINE (PF) 0.5% -1:200000 IJ SOLN
INTRAMUSCULAR | Status: DC | PRN
  Administered 2016-09-14: 30 mL via PERINEURAL

## 2016-09-14 MED ORDER — MIDAZOLAM HCL 2 MG/2ML IJ SOLN
1.0000 mg | INTRAMUSCULAR | Status: DC | PRN
  Administered 2016-09-14: 1 mg via INTRAVENOUS

## 2016-09-14 MED ORDER — ONDANSETRON HCL 4 MG/2ML IJ SOLN
INTRAMUSCULAR | Status: DC | PRN
  Administered 2016-09-14: 4 mg via INTRAVENOUS

## 2016-09-14 MED ORDER — SCOPOLAMINE 1 MG/3DAYS TD PT72: 1.0000 | MEDICATED_PATCH | Freq: Once | TRANSDERMAL | Status: DC | PRN

## 2016-09-14 MED ORDER — FENTANYL CITRATE (PF) 100 MCG/2ML IJ SOLN
50.0000 ug | INTRAMUSCULAR | Status: AC | PRN
  Administered 2016-09-14 (×3): 25 ug via INTRAVENOUS
  Administered 2016-09-14: 50 ug via INTRAVENOUS

## 2016-09-14 MED ORDER — CEFAZOLIN SODIUM-DEXTROSE 2-4 GM/100ML-% IV SOLN
INTRAVENOUS | Status: AC
  Filled 2016-09-14: qty 100

## 2016-09-14 SURGICAL SUPPLY — 64 items
BANDAGE COBAN STERILE 2 (GAUZE/BANDAGES/DRESSINGS) IMPLANT
BANDAGE ESMARK 6X9 LF (GAUZE/BANDAGES/DRESSINGS) ×1 IMPLANT
BLADE AVERAGE 25X9 (BLADE) IMPLANT
BLADE MINI RND TIP GREEN BEAV (BLADE) IMPLANT
BLADE OSC/SAG .038X5.5 CUT EDG (BLADE) ×2 IMPLANT
BLADE SURG 15 STRL LF DISP TIS (BLADE) ×2 IMPLANT
BLADE SURG 15 STRL SS (BLADE) ×2
BNDG CONFORM 2 STRL LF (GAUZE/BANDAGES/DRESSINGS) IMPLANT
BNDG CONFORM 3 STRL LF (GAUZE/BANDAGES/DRESSINGS) IMPLANT
BNDG ESMARK 4X9 LF (GAUZE/BANDAGES/DRESSINGS) IMPLANT
BNDG ESMARK 6X9 LF (GAUZE/BANDAGES/DRESSINGS) ×2
BNDG GAUZE 1X2.1 STRL (MISCELLANEOUS) ×2 IMPLANT
CORDS BIPOLAR (ELECTRODE) IMPLANT
COVER BACK TABLE 60X90IN (DRAPES) ×2 IMPLANT
CUFF TOURNIQUET SINGLE 18IN (TOURNIQUET CUFF) ×2 IMPLANT
CUFF TOURNIQUET SINGLE 24IN (TOURNIQUET CUFF) IMPLANT
DRAPE EXTREMITY T 121X128X90 (DRAPE) ×2 IMPLANT
DRAPE OEC MINIVIEW 54X84 (DRAPES) IMPLANT
DRAPE SURG 17X23 STRL (DRAPES) IMPLANT
DRAPE U-SHAPE 47X51 STRL (DRAPES) ×2 IMPLANT
DRSG EMULSION OIL 3X3 NADH (GAUZE/BANDAGES/DRESSINGS) ×2 IMPLANT
DURAPREP 26ML APPLICATOR (WOUND CARE) ×2 IMPLANT
GAUZE SPONGE 4X4 12PLY STRL (GAUZE/BANDAGES/DRESSINGS) IMPLANT
GLOVE EXAM NITRILE MD LF STRL (GLOVE) IMPLANT
GLOVE ORTHO TXT STRL SZ7.5 (GLOVE) IMPLANT
GLOVE SKINSENSE NS SZ7.5 (GLOVE) ×1
GLOVE SKINSENSE STRL SZ7.5 (GLOVE) ×1 IMPLANT
GLOVE SURG SYN 7.5  E (GLOVE) ×1
GLOVE SURG SYN 7.5 E (GLOVE) ×1 IMPLANT
GOWN SRG XL LVL 4 BRTHBL STRL (GOWNS) ×2 IMPLANT
GOWN STRL NON-REIN XL LVL4 (GOWNS) ×2
GOWN STRL REIN XL XLG (GOWN DISPOSABLE) ×2 IMPLANT
MANIFOLD NEPTUNE II (INSTRUMENTS) ×2 IMPLANT
NEEDLE HYPO 22GX1.5 SAFETY (NEEDLE) IMPLANT
NEEDLE HYPO 25X1 1.5 SAFETY (NEEDLE) IMPLANT
NS IRRIG 1000ML POUR BTL (IV SOLUTION) ×2 IMPLANT
PACK BASIN DAY SURGERY FS (CUSTOM PROCEDURE TRAY) ×2 IMPLANT
PAD CAST 4YDX4 CTTN HI CHSV (CAST SUPPLIES) ×1 IMPLANT
PADDING CAST ABS 4INX4YD NS (CAST SUPPLIES) ×1
PADDING CAST ABS COTTON 4X4 ST (CAST SUPPLIES) ×1 IMPLANT
PADDING CAST COTTON 4X4 STRL (CAST SUPPLIES) ×1
SET IRRIG Y TYPE TUR BLADDER L (SET/KITS/TRAYS/PACK) ×2 IMPLANT
SHEET MEDIUM DRAPE 40X70 STRL (DRAPES) ×2 IMPLANT
SLEEVE SCD COMPRESS KNEE MED (MISCELLANEOUS) IMPLANT
SPONGE GAUZE 2X2 8PLY STRL LF (GAUZE/BANDAGES/DRESSINGS) IMPLANT
SPONGE LAP 18X18 X RAY DECT (DISPOSABLE) ×2 IMPLANT
STOCKINETTE 6  STRL (DRAPES) ×1
STOCKINETTE 6 STRL (DRAPES) ×1 IMPLANT
SUCTION FRAZIER HANDLE 10FR (MISCELLANEOUS)
SUCTION TUBE FRAZIER 10FR DISP (MISCELLANEOUS) IMPLANT
SUT BONE WAX W31G (SUTURE) ×2 IMPLANT
SUT ETHILON 3 0 PS 1 (SUTURE) ×2 IMPLANT
SUT ETHILON 4 0 PS 2 18 (SUTURE) IMPLANT
SUT MNCRL AB 3-0 PS2 18 (SUTURE) ×2 IMPLANT
SUT MNCRL AB 4-0 PS2 18 (SUTURE) IMPLANT
SUT VIC AB 3-0 PS1 18 (SUTURE)
SUT VIC AB 3-0 PS1 18XBRD (SUTURE) IMPLANT
SYR BULB 3OZ (MISCELLANEOUS) ×2 IMPLANT
SYR CONTROL 10ML LL (SYRINGE) IMPLANT
TOWEL OR 17X24 6PK STRL BLUE (TOWEL DISPOSABLE) ×2 IMPLANT
TOWEL OR NON WOVEN STRL DISP B (DISPOSABLE) ×2 IMPLANT
TUBE CONNECTING 20X1/4 (TUBING) ×2 IMPLANT
UNDERPAD 30X30 (UNDERPADS AND DIAPERS) ×2 IMPLANT
YANKAUER SUCT BULB TIP NO VENT (SUCTIONS) ×2 IMPLANT

## 2016-09-14 NOTE — Op Note (Signed)
   Date of Surgery: 09/14/2016  INDICATIONS: Mr. Beckles is a 76 y.o.-year-old male with a 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 osteomyelitis;  The patient did consent to the procedure after discussion of the risks and benefits.  PREOPERATIVE DIAGNOSIS: Right 2nd toe osteomyelitis  POSTOPERATIVE DIAGNOSIS: Same.  PROCEDURE: Right foot 2nd ray amputation  SURGEON: N. Eduard Roux, M.D.  ASSIST: none.  ANESTHESIA:  Regional and MAC  IV FLUIDS AND URINE: See anesthesia.  ESTIMATED BLOOD LOSS: minimal mL.  IMPLANTS: none  DRAINS: none  COMPLICATIONS: None.  DESCRIPTION OF PROCEDURE: The patient was brought to the operating room and placed supine on the operating table.  The patient had been signed prior to the procedure and this was documented. The patient had the anesthesia placed by the anesthesiologist.  A time-out was performed to confirm that this was the correct patient, site, side and location. The patient did receive antibiotics prior to the incision and was re-dosed during the procedure as needed at indicated intervals.  A esmarch tourniquet was placed.  The patient had the operative extremity prepped and draped in the standard surgical fashion.    I created a dorsally-based racquet-type incision over the second ray. Dissection was carried down to the bone. Perforating vessels were coagulated. Subperiosteal elevation was performed of the second metatarsal. The osteotomy was then made using oscillating saw. The second ray was then amputated along with the toe. I then inspected the flexor tendon sheath which did not exhibit any signs of infection. The wound was then thoroughly irrigated with 3 L of normal saline. Hemostasis was obtained. The skin was closed loosely with 3-0 nylon. Sterile dressings were applied. Patient tolerated procedure well had no immediate competitions.  POSTOPERATIVE PLAN: Patient will be discharged home.  Azucena Cecil, MD [place] 11:00 AM

## 2016-09-14 NOTE — Discharge Instructions (Signed)
Postoperative instructions: ° °Weightbearing instructions: as tolerated in postop shoe ° °Dressing instructions: Keep your dressing and/or splint clean and dry at all times.  It will be removed at your first post-operative appointment.  Your stitches and/or staples will be removed at this visit. ° °Incision instructions:  Do not soak your incision for 3 weeks after surgery.  If the incision gets wet, pat dry and do not scrub the incision. ° °Pain control:  You have been given a prescription to be taken as directed for post-operative pain control.  In addition, elevate the operative extremity above the heart at all times to prevent swelling and throbbing pain. ° °Take over-the-counter Colace, 100mg by mouth twice a day while taking narcotic pain medications to help prevent constipation. ° °Follow up appointments: °1) 10-14 days for suture removal and wound check. °2) Dr. Xu as scheduled. ° ° ------------------------------------------------------------------------------------------------------------- ° °After Surgery Pain Control: ° °After your surgery, post-surgical discomfort or pain is likely. This discomfort can last several days to a few weeks. At certain times of the day your discomfort may be more intense.  °Did you receive a nerve block?  °A nerve block can provide pain relief for one hour to two days after your surgery. As long as the nerve block is working, you will experience little or no sensation in the area the surgeon operated on.  °As the nerve block wears off, you will begin to experience pain or discomfort. It is very important that you begin taking your prescribed pain medication before the nerve block fully wears off. Treating your pain at the first sign of the block wearing off will ensure your pain is better controlled and more tolerable when full-sensation returns. Do not wait until the pain is intolerable, as the medicine will be less effective. It is better to treat pain in advance than to try  and catch up.  °General Anesthesia:  °If you did not receive a nerve block during your surgery, you will need to start taking your pain medication shortly after your surgery and should continue to do so as prescribed by your surgeon.  °Pain Medication:  °Most commonly we prescribe Vicodin and Percocet for post-operative pain. Both of these medications contain a combination of acetaminophen (Tylenol®) and a narcotic to help control pain.  °· It takes between 30 and 45 minutes before pain medication starts to work. It is important to take your medication before your pain level gets too intense.  °· Nausea is a common side effect of many pain medications. You will want to eat something before taking your pain medicine to help prevent nausea.  °· If you are taking a prescription pain medication that contains acetaminophen, we recommend that you do not take additional over the counter acetaminophen (Tylenol®).  °Other pain relieving options:  °· Using a cold pack to ice the affected area a few times a day (15 to 20 minutes at a time) can help to relieve pain, reduce swelling and bruising.  °· Elevation of the affected area can also help to reduce pain and swelling. ° ° ° ° ° °Regional Anesthesia Blocks ° °1. Numbness or the inability to move the "blocked" extremity may last from 3-48 hours after placement. The length of time depends on the medication injected and your individual response to the medication. If the numbness is not going away after 48 hours, call your surgeon. ° °2. The extremity that is blocked will need to be protected until the numbness is gone and   the  Strength has returned. Because you cannot feel it, you will need to take extra care to avoid injury. Because it may be weak, you may have difficulty moving it or using it. You may not know what position it is in without looking at it while the block is in effect. ° °3. For blocks in the legs and feet, returning to weight bearing and walking needs to be  done carefully. You will need to wait until the numbness is entirely gone and the strength has returned. You should be able to move your leg and foot normally before you try and bear weight or walk. You will need someone to be with you when you first try to ensure you do not fall and possibly risk injury. ° °4. Bruising and tenderness at the needle site are common side effects and will resolve in a few days. ° °5. Persistent numbness or new problems with movement should be communicated to the surgeon or the [facility] ([phone])/ [facility] ([phone]). ° ° ° °Post Anesthesia Home Care Instructions ° °Activity: °Get plenty of rest for the remainder of the day. A responsible individual must stay with you for 24 hours following the procedure.  °For the next 24 hours, DO NOT: °-Drive a car °-Operate machinery °-Drink alcoholic beverages °-Take any medication unless instructed by your physician °-Make any legal decisions or sign important papers. ° °Meals: °Start with liquid foods such as gelatin or soup. Progress to regular foods as tolerated. Avoid greasy, spicy, heavy foods. If nausea and/or vomiting occur, drink only clear liquids until the nausea and/or vomiting subsides. Call your physician if vomiting continues. ° °Special Instructions/Symptoms: °Your throat may feel dry or sore from the anesthesia or the breathing tube placed in your throat during surgery. If this causes discomfort, gargle with warm salt water. The discomfort should disappear within 24 hours. ° °If you had a scopolamine patch placed behind your ear for the management of post- operative nausea and/or vomiting: ° °1. The medication in the patch is effective for 72 hours, after which it should be removed.  Wrap patch in a tissue and discard in the trash. Wash hands thoroughly with soap and water. °2. You may remove the patch earlier than 72 hours if you experience unpleasant side effects which may include dry  mouth, dizziness or visual disturbances. °3. Avoid touching the patch. Wash your hands with soap and water after contact with the patch. °  ° °

## 2016-09-14 NOTE — Anesthesia Procedure Notes (Addendum)
Anesthesia Regional Block: Popliteal block   Pre-Anesthetic Checklist: ,, timeout performed, Correct Patient, Correct Site, Correct Laterality, Correct Procedure, Correct Position, site marked, Risks and benefits discussed,  Surgical consent,  Pre-op evaluation,  At surgeon's request and post-op pain management  Laterality: Right and Lower  Prep: Maximum Sterile Barrier Precautions used, chloraprep       Needles:  Injection technique: Single-shot  Needle Type: Echogenic Stimulator Needle     Needle Length: 10cm      Additional Needles:   Procedures: ultrasound guided, nerve stimulator,,,,,,  Narrative:  Start time: 09/14/2016 9:54 AM End time: 09/14/2016 9:59 AM Injection made incrementally with aspirations every 5 mL.  Performed by: Personally  Anesthesiologist: Montez Hageman  Additional Notes: Patient tolerated the procedure well without complications

## 2016-09-14 NOTE — Anesthesia Preprocedure Evaluation (Addendum)
Anesthesia Evaluation  Patient identified by MRN, date of birth, ID band Patient awake    Reviewed: Allergy & Precautions, NPO status , Patient's Chart, lab work & pertinent test results  Airway Mallampati: II  TM Distance: >3 FB Neck ROM: Full    Dental no notable dental hx.    Pulmonary neg pulmonary ROS,    Pulmonary exam normal breath sounds clear to auscultation       Cardiovascular hypertension, Pt. on medications Normal cardiovascular exam Rhythm:Regular Rate:Normal     Neuro/Psych negative neurological ROS  negative psych ROS   GI/Hepatic negative GI ROS, Neg liver ROS,   Endo/Other  diabetes, Type 2, Oral Hypoglycemic Agents  Renal/GU H/o urinary retention   negative genitourinary   Musculoskeletal negative musculoskeletal ROS (+)   Abdominal   Peds negative pediatric ROS (+)  Hematology negative hematology ROS (+)   Anesthesia Other Findings   Reproductive/Obstetrics negative OB ROS                             Anesthesia Physical Anesthesia Plan  ASA: II  Anesthesia Plan: Regional and MAC   Post-op Pain Management:    Induction:   Airway Management Planned: Simple Face Mask  Additional Equipment:   Intra-op Plan:   Post-operative Plan:   Informed Consent: I have reviewed the patients History and Physical, chart, labs and discussed the procedure including the risks, benefits and alternatives for the proposed anesthesia with the patient or authorized representative who has indicated his/her understanding and acceptance.   Dental advisory given  Plan Discussed with: CRNA  Anesthesia Plan Comments: (Popliteal block with sedation)       Anesthesia Quick Evaluation

## 2016-09-14 NOTE — Transfer of Care (Signed)
Immediate Anesthesia Transfer of Care Note  Patient: Walter [name]  Procedure(s) Performed: Procedure(s): RIGHT 2ND TOE/RAY AMPUTATION (Right)  Patient Location: PACU  Anesthesia Type:MAC combined with regional for post-op pain  Level of Consciousness: awake, alert , oriented and patient cooperative  Airway & Oxygen Therapy: Patient Spontanous Breathing and Patient connected to face mask oxygen  Post-op Assessment: Report given to RN and Post -op Vital signs reviewed and stable  Post vital signs: Reviewed and stable  Last Vitals:  Vitals:   09/14/16 0952 09/14/16 0955  BP: (!) 154/88 (!) 151/81  Pulse: 73 77  Resp: 12 17  Temp:      Last Pain:  Vitals:   09/14/16 0857  TempSrc: Oral         Complications: No apparent anesthesia complications

## 2016-09-14 NOTE — Anesthesia Postprocedure Evaluation (Signed)
Anesthesia Post Note  Patient: Walter [name]  Procedure(s) Performed: Procedure(s) (LRB): RIGHT 2ND TOE/RAY AMPUTATION (Right)  Patient location during evaluation: PACU Anesthesia Type: Regional and MAC Level of consciousness: awake and alert Pain management: pain level controlled Vital Signs Assessment: post-procedure vital signs reviewed and stable Respiratory status: spontaneous breathing, nonlabored ventilation, respiratory function stable and patient connected to nasal cannula oxygen Cardiovascular status: stable and blood pressure returned to baseline Anesthetic complications: no       Last Vitals:  Vitals:   09/14/16 1130 09/14/16 1230  BP: (!) 142/82 (!) 147/80  Pulse: 62 68  Resp: 11 18  Temp:  36.7 C    Last Pain:  Vitals:   09/14/16 1230  TempSrc:   PainSc: 0-No pain        RLE Motor Response: Purposeful movement (09/14/16 1230) RLE Sensation: No sensation (absent);No pain;No tingling (09/14/16 1230)      Montez Hageman

## 2016-09-14 NOTE — Progress Notes (Signed)
Assisted Dr. Marcell Barlow with right, ultrasound guided, popliteal block. Side rails up, monitors on throughout procedure. See vital signs in flow sheet. Tolerated Procedure well.

## 2016-09-14 NOTE — H&P (Signed)
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  He denies any changes in medical history.  Past Medical History:  Diagnosis Date  . Colon cancer ([place]) 1992   colon resection 1992  . Complication of anesthesia    bladder problems after surgery from Anesthesia- can not urinate  . Diabetes mellitus    type 2  . History of shingles   . Hyperlipidemia   . Hypertension   . Osteomyelitis ([place])    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  . Sleep apnea    uses CPAP nightly   Past Surgical History:  Procedure Laterality Date  . APPENDECTOMY  1949  . COLECTOMY  1992  . CYSTOSCOPY N/A 06/21/2015   Procedure: CYSTOSCOPY, CLOT EVACUATION, AND CAUTERIZATION OF PROSTATE FOSSA;  Surgeon: Carolan Clines, MD;  Location: [facility];  Service: Urology;  Laterality: N/A;  . HEMORRHOID SURGERY    . TRANSURETHRAL RESECTION OF PROSTATE N/A 05/23/2015   Procedure: TRANSURETHRAL RESECTION OF THE PROSTATE (TURP);  Surgeon: Irine Seal, MD;  Location: [facility];  Service: Urology;  Laterality: N/A;   Social History   Social History  . Marital status: Married    Spouse name: N/A  . Number of children: N/A  . Years of education: N/A   Social History Main Topics  . Smoking status: Never Smoker  . Smokeless tobacco: Never Used     Comment: smoked for less that 6 months in 1960  . Alcohol use 1.2 oz/week    2 Cans of beer per week     Comment: social  . Drug use: No  . Sexual activity: Not Asked   Other Topics Concern  . None   Social History Narrative   Married 1966. 2 daughters Sharee Pimple divorced lives in [place] works for [employer] no kids and Mateo Flow never married in [place], [place] working for [employer] for [employer] improvement no kids.       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)      No religious beliefs affecting health  care, no afterlife beliefs   Family History  Problem Relation Age of Onset  . Colon cancer Father 64  . Diabetes Father        type I  . Alzheimer's disease Mother   . Cancer Unknown        colon/fhx  . Rectal cancer Maternal Uncle   . Colon cancer Paternal Grandmother 36   Allergies  Allergen Reactions  . Bactrim [Sulfamethoxazole-Trimethoprim] Nausea And Vomiting    Pt has chills and fevers also.   Prior to Admission medications   Medication Sig Start Date End Date Taking? Authorizing Provider  amLODipine (NORVASC) 5 MG tablet TAKE 1 TABLET (5 MG TOTAL) BY MOUTH DAILY. 05/09/16  Yes [name], MD  aspirin 81 MG tablet Take 81 mg by mouth daily.   Yes [provider]  doxycycline (VIBRA-TABS) 100 MG tablet Take 1 tablet (100 mg total) by mouth 2 (two) times daily. 09/13/16  Yes Leandrew Koyanagi, MD  JANUVIA 100 MG tablet TAKE 1 TABLET EVERY DAY 03/31/16  Yes [name], MD  metFORMIN (GLUCOPHAGE) 1000 MG tablet TAKE 1 TABLET (1,000 MG TOTAL) BY MOUTH 2 (TWO) TIMES DAILY WITH A MEAL. 03/25/16  Yes [name], MD  Multiple Vitamins-Minerals (CENTRUM SILVER PO) Take 1 tablet  by mouth daily.    Yes [provider]  telmisartan (MICARDIS) 40 MG tablet TAKE 1 TABLET (40 MG TOTAL) BY MOUTH DAILY. 05/09/16  Yes [name], MD  atorvastatin (LIPITOR) 20 MG tablet TAKE 1 TABLET ONCE A WEEK 08/02/16   [name], MD  glimepiride (AMARYL) 4 MG tablet TAKE 2 TABLETS BY MOUTH BEFORE BREAKFAST 05/03/16   [name], MD  glucose blood (ONE TOUCH ULTRA TEST) test strip Use as instructed to check blood sugar once a day 06/15/15   [name], MD     Positive ROS: All other systems have been reviewed and were otherwise negative with the exception of those mentioned in the HPI and as above.  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   Eduard Roux, MD   09/14/2016 9:34 AM

## 2016-09-14 NOTE — Anesthesia Procedure Notes (Signed)
Procedure Name: MAC Date/Time: 09/14/2016 10:21 AM Performed by: Lieutenant Diego Pre-anesthesia Checklist: Patient identified, Timeout performed, Emergency Drugs available, Suction available and Patient being monitored Patient Re-evaluated:Patient Re-evaluated prior to inductionOxygen Delivery Method: Simple face mask Intubation Type: IV induction

## 2016-09-16 ENCOUNTER — Encounter (HOSPITAL_BASED_OUTPATIENT_CLINIC_OR_DEPARTMENT_OTHER): Payer: Self-pay | Admitting: Orthopaedic Surgery

## 2016-09-20 ENCOUNTER — Encounter (INDEPENDENT_AMBULATORY_CARE_PROVIDER_SITE_OTHER): Payer: Self-pay | Admitting: Orthopaedic Surgery

## 2016-09-20 ENCOUNTER — Ambulatory Visit (INDEPENDENT_AMBULATORY_CARE_PROVIDER_SITE_OTHER): Payer: Medicare Other | Admitting: Orthopaedic Surgery

## 2016-09-20 DIAGNOSIS — L97519 Non-pressure chronic ulcer of other part of right foot with unspecified severity: Secondary | ICD-10-CM

## 2016-09-20 DIAGNOSIS — M86671 Other chronic osteomyelitis, right ankle and foot: Secondary | ICD-10-CM

## 2016-09-20 NOTE — Progress Notes (Signed)
Patient is one-week status post right second ray amputation. He is doing well. Denies any pain. Incision is clean dry and intact. We will keep the sutures in for 2 more weeks. He may continue to ambulate as tolerated in a postop shoe. Mupirocin and dry dressing daily. Follow-up in 2 weeks for wound check and suture removal.

## 2016-09-21 ENCOUNTER — Other Ambulatory Visit: Payer: Self-pay | Admitting: Family Medicine

## 2016-10-04 ENCOUNTER — Encounter (INDEPENDENT_AMBULATORY_CARE_PROVIDER_SITE_OTHER): Payer: Self-pay | Admitting: Orthopaedic Surgery

## 2016-10-04 ENCOUNTER — Ambulatory Visit (INDEPENDENT_AMBULATORY_CARE_PROVIDER_SITE_OTHER): Payer: Medicare Other | Admitting: Orthopaedic Surgery

## 2016-10-04 ENCOUNTER — Encounter: Payer: Self-pay | Admitting: Family Medicine

## 2016-10-04 DIAGNOSIS — M86671 Other chronic osteomyelitis, right ankle and foot: Secondary | ICD-10-CM

## 2016-10-04 DIAGNOSIS — L97519 Non-pressure chronic ulcer of other part of right foot with unspecified severity: Secondary | ICD-10-CM

## 2016-10-04 NOTE — Progress Notes (Signed)
Patient is 3 weeks status post second ray amputation. He denies any pain and he is doing well. The incision has healed today and the sutures were removed. There is no drainage. He may begin to increase his walking up to about cortical mild for the next 3 weeks. I'll like to see him back in 3 weeks for recheck. He may give [place] a call if she wants an orthotic and shoe if he has any discomfort with walking.

## 2016-10-25 ENCOUNTER — Ambulatory Visit (INDEPENDENT_AMBULATORY_CARE_PROVIDER_SITE_OTHER): Payer: Medicare Other | Admitting: Orthopaedic Surgery

## 2016-10-25 ENCOUNTER — Encounter (INDEPENDENT_AMBULATORY_CARE_PROVIDER_SITE_OTHER): Payer: Self-pay | Admitting: Orthopaedic Surgery

## 2016-10-25 DIAGNOSIS — L97519 Non-pressure chronic ulcer of other part of right foot with unspecified severity: Secondary | ICD-10-CM

## 2016-10-25 DIAGNOSIS — M86671 Other chronic osteomyelitis, right ankle and foot: Secondary | ICD-10-CM

## 2016-10-25 NOTE — Progress Notes (Signed)
Patient is 6 weeks status post right second ray amputation. He is doing well other than his great toes just crowding over to the third toe. He does not have any ulcers. His surgical scar is fully healed. At this point we'll send him over to [employer] to see if they can make an orthotic for him to help with crowding of the toes. Possibly a shoe with a wide toebox. Questions encouraged and answered. Follow-up with me as needed.

## 2016-10-31 ENCOUNTER — Other Ambulatory Visit: Payer: Self-pay | Admitting: Family Medicine

## 2016-11-06 ENCOUNTER — Other Ambulatory Visit: Payer: Self-pay | Admitting: Family Medicine

## 2016-11-09 ENCOUNTER — Ambulatory Visit: Payer: Medicare Other | Admitting: Family Medicine

## 2016-11-10 ENCOUNTER — Encounter (HOSPITAL_BASED_OUTPATIENT_CLINIC_OR_DEPARTMENT_OTHER): Payer: Self-pay | Admitting: Orthopaedic Surgery

## 2016-11-10 NOTE — Anesthesia Postprocedure Evaluation (Signed)
Anesthesia Post Note  Patient: Walter [name]  Procedure(s) Performed: Procedure(s) (LRB): RIGHT 2ND TOE/RAY AMPUTATION (Right)     Anesthesia Post Evaluation  Last Vitals:  Vitals:   09/14/16 1130 09/14/16 1230  BP: (!) 142/82 (!) 147/80  Pulse: 62 68  Resp: 11 18  Temp:  36.7 C    Last Pain:  Vitals:   09/15/16 0836  TempSrc:   PainSc: 0-No pain                 Montez Hageman

## 2016-11-10 NOTE — Addendum Note (Signed)
Addendum  created 11/10/16 1434 by Montez Hageman, MD   Sign clinical note

## 2016-11-22 ENCOUNTER — Encounter: Payer: Self-pay | Admitting: Family Medicine

## 2016-11-22 ENCOUNTER — Ambulatory Visit (INDEPENDENT_AMBULATORY_CARE_PROVIDER_SITE_OTHER): Payer: Medicare Other | Admitting: Family Medicine

## 2016-11-22 VITALS — BP 110/66 | HR 89 | Temp 98.2°F | Ht 72.0 in | Wt 209.0 lb

## 2016-11-22 DIAGNOSIS — E785 Hyperlipidemia, unspecified: Secondary | ICD-10-CM | POA: Diagnosis not present

## 2016-11-22 DIAGNOSIS — Z89421 Acquired absence of other right toe(s): Secondary | ICD-10-CM | POA: Diagnosis not present

## 2016-11-22 DIAGNOSIS — IMO0002 Reserved for concepts with insufficient information to code with codable children: Secondary | ICD-10-CM

## 2016-11-22 DIAGNOSIS — I10 Essential (primary) hypertension: Secondary | ICD-10-CM

## 2016-11-22 DIAGNOSIS — E119 Type 2 diabetes mellitus without complications: Secondary | ICD-10-CM

## 2016-11-22 HISTORY — DX: Acquired absence of other right toe(s): Z89.421

## 2016-11-22 NOTE — Assessment & Plan Note (Signed)
S/p 2nd ray amputation 2018- started after prolonged use of corn pad

## 2016-11-22 NOTE — Patient Instructions (Addendum)
Schedule physical late august or early September- come fasting and will update labs- will still do fingerprick a1c so we can make adjustments in meds- hoping a1c at least below 7.5.   Have a great time in [place] but watch that sugar !

## 2016-11-22 NOTE — Progress Notes (Signed)
Subjective:  Walter [name] is a 76 y.o. year old very pleasant male patient who presents for/with See problem oriented charting ROS- has had a few episodes of mild hypoglycemia. No chest pain or shortness of breath. Some foot pain after surgery but improved.    Past Medical History-  Patient Active Problem List   Diagnosis Date Noted  . BPH (benign prostatic hyperplasia) 07/02/2015    Priority: High  . Diabetes mellitus type II, controlled ([place]) 04/23/2007    Priority: High  . Hyperlipidemia 04/23/2007    Priority: Medium  . Obstructive sleep apnea 04/23/2007    Priority: Medium  . Essential hypertension 04/23/2007    Priority: Medium  . Toe amputation status, right ([place]) 11/22/2016    Priority: Low  . Sepsis secondary to UTI ([place]) 03/29/2015    Priority: Low  . Hallux valgus of right foot 03/26/2014    Priority: Low  . History of colon cancer 01/27/2014    Priority: Low  . IRITIS 12/17/2008    Priority: Low  . 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 BIGEMINY 04/21/2008    Priority: Low    Medications- reviewed and updated Current Outpatient Prescriptions  Medication Sig Dispense Refill  . amLODipine (NORVASC) 5 MG tablet TAKE 1 TABLET (5 MG TOTAL) BY MOUTH DAILY. 90 tablet 3  . aspirin 81 MG tablet Take 81 mg by mouth daily.    Marland Kitchen atorvastatin (LIPITOR) 20 MG tablet TAKE 1 TABLET ONCE A WEEK 52 tablet 2  . glimepiride (AMARYL) 4 MG tablet TAKE 2 TABLETS BY MOUTH BEFORE BREAKFAST 180 tablet 1  . JANUVIA 100 MG tablet TAKE 1 TABLET EVERY DAY 90 tablet 1  . metFORMIN (GLUCOPHAGE) 1000 MG tablet TAKE 1 TABLET (1,000 MG TOTAL) BY MOUTH 2 (TWO) TIMES DAILY WITH A MEAL. 180 tablet 1  . Multiple Vitamins-Minerals (CENTRUM SILVER PO) Take 1 tablet by mouth daily.     . ONE TOUCH ULTRA TEST test strip USE AS INSTRUCTED TO CHECK BLOOD SUGAR ONCE A DAY 100 each 3  . telmisartan (MICARDIS) 40 MG tablet TAKE 1  TABLET (40 MG TOTAL) BY MOUTH DAILY. 90 tablet 3   No current facility-administered medications for this visit.     Objective: BP 110/66 (BP Location: Left Arm, Patient Position: Sitting, Cuff Size: Large)   Pulse 89   Temp 98.2 F (36.8 C) (Oral)   Ht 6' (1.829 m)   Wt 209 lb (94.8 kg)   SpO2 95%   BMI 28.35 kg/m  Gen: NAD, resting comfortably CV: RRR no murmurs rubs or gallops Lungs: CTAB no crackles, wheeze, rhonchi Ext: no edema Skin: warm, dry Shows me amputated 2nd toe on foot- has healed well Neuro: normal gait  Assessment/Plan:   HTN S: controlled on amlodipine 5mg  and telmisartan 40mg .  BP Readings from Last 3 Encounters:  11/22/16 110/66  09/14/16 (!) 147/80  09/05/16 140/86  A/P: We discussed blood pressure goal of <140/90. Continue current meds:  At goal  HLD S: well controlled on atorvastatin 20mg  weekly. No myalgias.  Lab Results  Component Value Date   CHOL 138 06/29/2015   HDL 41.30 06/29/2015   LDLCALC 67 06/29/2015   LDLDIRECT 93.0 05/10/2016   TRIG 149.0 06/29/2015   CHOLHDL 3 06/29/2015   A/P: update lipids at CPE- tolerating statin  Diabetes mellitus type II, controlled ([place]) S: appears improved control. On januvia 100mg , amaryl 8mg , metformin 1g  BID CBGs- out and active has gotten as low as 65 (treats this easily- very rare that it occurs 2x in 3 months- starts sweating) but usually around 90 mid afternoon. In mornings around 140 so goes down during the day.  Exercise and diet- very low carb diet. Exercise limited due to foot pain but improving Lab Results  Component Value Date   HGBA1C 8.2 (H) 09/05/2016   HGBA1C 7.9 (H) 05/10/2016   HGBA1C 6.7 01/05/2016   A/P: returns in late august from [place]- he is not due for a1c yet- we opted to wait until he returns to come back for a physical. Would do POC a1c then fasting bloodwork so we could discuss plan at that time. No changes for now giving improved CBGs  Toe amputation status, right  ([place]) S/p 2nd ray amputation 2018- started after prolonged use of corn pad  Late august or early September CPE advised.   Return precautions advised.  [name], MD

## 2016-11-22 NOTE — Assessment & Plan Note (Signed)
S: appears improved control. On januvia 100mg , amaryl 8mg , metformin 1g BID CBGs- out and active has gotten as low as 65 (treats this easily- very rare that it occurs 2x in 3 months- starts sweating) but usually around 90 mid afternoon. In mornings around 140 so goes down during the day.  Exercise and diet- very low carb diet. Exercise limited due to foot pain but improving Lab Results  Component Value Date   HGBA1C 8.2 (H) 09/05/2016   HGBA1C 7.9 (H) 05/10/2016   HGBA1C 6.7 01/05/2016   A/P: returns in late august from [place]- he is not due for a1c yet- we opted to wait until he returns to come back for a physical. Would do POC a1c then fasting bloodwork so we could discuss plan at that time. No changes for now giving improved CBGs

## 2017-01-11 ENCOUNTER — Encounter: Payer: Self-pay | Admitting: Family Medicine

## 2017-01-11 ENCOUNTER — Ambulatory Visit (INDEPENDENT_AMBULATORY_CARE_PROVIDER_SITE_OTHER): Payer: Medicare Other | Admitting: Family Medicine

## 2017-01-11 VITALS — BP 120/60 | HR 75 | Temp 98.2°F | Ht 71.5 in | Wt 210.2 lb

## 2017-01-11 DIAGNOSIS — Z23 Encounter for immunization: Secondary | ICD-10-CM

## 2017-01-11 DIAGNOSIS — I10 Essential (primary) hypertension: Secondary | ICD-10-CM | POA: Diagnosis not present

## 2017-01-11 DIAGNOSIS — E785 Hyperlipidemia, unspecified: Secondary | ICD-10-CM

## 2017-01-11 DIAGNOSIS — IMO0002 Reserved for concepts with insufficient information to code with codable children: Secondary | ICD-10-CM

## 2017-01-11 DIAGNOSIS — G4733 Obstructive sleep apnea (adult) (pediatric): Secondary | ICD-10-CM

## 2017-01-11 DIAGNOSIS — R351 Nocturia: Secondary | ICD-10-CM | POA: Diagnosis not present

## 2017-01-11 DIAGNOSIS — E119 Type 2 diabetes mellitus without complications: Secondary | ICD-10-CM

## 2017-01-11 DIAGNOSIS — Z89421 Acquired absence of other right toe(s): Secondary | ICD-10-CM | POA: Diagnosis not present

## 2017-01-11 DIAGNOSIS — Z0000 Encounter for general adult medical examination without abnormal findings: Secondary | ICD-10-CM | POA: Diagnosis not present

## 2017-01-11 DIAGNOSIS — N401 Enlarged prostate with lower urinary tract symptoms: Secondary | ICD-10-CM

## 2017-01-11 LAB — COMPREHENSIVE METABOLIC PANEL
ALK PHOS: 63 U/L (ref 39–117)
ALT: 22 U/L (ref 0–53)
AST: 18 U/L (ref 0–37)
Albumin: 4.6 g/dL (ref 3.5–5.2)
BUN: 15 mg/dL (ref 6–23)
CO2: 30 mEq/L (ref 19–32)
Calcium: 9.9 mg/dL (ref 8.4–10.5)
Chloride: 101 mEq/L (ref 96–112)
Creatinine, Ser: 0.89 mg/dL (ref 0.40–1.50)
GFR: 88.3 mL/min (ref >60.00)
GLUCOSE: 140 mg/dL — AB (ref 70–99)
Potassium: 4.2 mEq/L (ref 3.5–5.1)
Sodium: 139 mEq/L (ref 135–145)
TOTAL PROTEIN: 7.2 g/dL (ref 6.0–8.3)
Total Bilirubin: 0.7 mg/dL (ref 0.2–1.2)

## 2017-01-11 LAB — URINALYSIS
Bilirubin Urine: NEGATIVE
Hgb urine dipstick: NEGATIVE
Ketones, ur: NEGATIVE
Leukocytes, UA: NEGATIVE
NITRITE: NEGATIVE
PH: 5.5 (ref 5.0–8.0)
SPECIFIC GRAVITY, URINE: 1.01 (ref 1.000–1.030)
TOTAL PROTEIN, URINE-UPE24: NEGATIVE
Urine Glucose: NEGATIVE
Urobilinogen, UA: 0.2 (ref 0.0–1.0)

## 2017-01-11 LAB — CBC
HCT: 42.9 % (ref 39.0–52.0)
HEMOGLOBIN: 14.5 g/dL (ref 13.0–17.0)
MCHC: 33.9 g/dL (ref 30.0–36.0)
MCV: 93.6 fl (ref 78.0–100.0)
PLATELETS: 181 10*3/uL (ref 150.0–400.0)
RBC: 4.59 Mil/uL (ref 4.22–5.81)
RDW: 13.9 % (ref 11.5–15.5)
WBC: 7.8 10*3/uL (ref 4.0–10.5)

## 2017-01-11 LAB — LIPID PANEL
CHOLESTEROL: 143 mg/dL (ref 0–200)
HDL: 47.7 mg/dL (ref >39.00)
LDL Cholesterol: 67 mg/dL (ref 0–99)
NONHDL: 95.25
Total CHOL/HDL Ratio: 3
Triglycerides: 142 mg/dL (ref 0.0–149.0)
VLDL: 28.4 mg/dL (ref 0.0–40.0)

## 2017-01-11 LAB — HEMOGLOBIN A1C: HEMOGLOBIN A1C: 7.2

## 2017-01-11 NOTE — Patient Instructions (Addendum)
Please stop by lab before you go  Lab Results  Component Value Date   HGBA1C 8.2 (H) 09/05/2016  Down to 7.2!!! Great job- keep up the great work  No other changes today

## 2017-01-11 NOTE — Progress Notes (Signed)
Phone: [phone]  Subjective:  Patient presents today for their annual physical. Chief complaint-noted.   See problem oriented charting- ROS- full  review of systems was completed and negative including No chest pain or shortness of breath. No headache or blurry vision. Mild hypoglycemia symptoms if CBG in 80s and out in the heat- mild anxiety  The following were reviewed and entered/updated in epic: Past Medical History:  Diagnosis Date  . Colon cancer ([place]) 1992   colon resection 1992  . Complication of anesthesia    bladder problems after surgery from Anesthesia- can not urinate  . Diabetes mellitus    type 2  . History of shingles   . Hyperlipidemia   . Hypertension   . Osteomyelitis ([place])    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  . Sleep apnea    uses CPAP nightly   Patient Active Problem List   Diagnosis Date Noted  . BPH (benign prostatic hyperplasia) 07/02/2015    Priority: High  . Diabetes mellitus type II, controlled ([place]) 04/23/2007    Priority: High  . Hyperlipidemia 04/23/2007    Priority: Medium  . Obstructive sleep apnea 04/23/2007    Priority: Medium  . Essential hypertension 04/23/2007    Priority: Medium  . 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 Sepsis secondary to UTI ([place]) 03/29/2015    Priority: Low  . Hallux valgus of right foot 03/26/2014    Priority: Low  . History of colon cancer 01/27/2014    Priority: Low  . IRITIS 12/17/2008    Priority: Low  . 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 BIGEMINY 04/21/2008    Priority: Low   Past Surgical History:  Procedure Laterality Date  . AMPUTATION TOE Right 09/14/2016   Procedure: RIGHT 2ND TOE/RAY AMPUTATION;  Surgeon: Leandrew Koyanagi, MD;  Location: [facility];  Service: Orthopedics;  Laterality: Right;  . APPENDECTOMY  1949  . COLECTOMY  1992  . CYSTOSCOPY N/A 06/21/2015   Procedure:  CYSTOSCOPY, CLOT EVACUATION, AND CAUTERIZATION OF PROSTATE FOSSA;  Surgeon: Carolan Clines, MD;  Location: [facility];  Service: Urology;  Laterality: N/A;  . HEMORRHOID SURGERY    . TRANSURETHRAL RESECTION OF PROSTATE N/A 05/23/2015   Procedure: TRANSURETHRAL RESECTION OF THE PROSTATE (TURP);  Surgeon: Irine Seal, MD;  Location: [facility];  Service: Urology;  Laterality: N/A;    Family History  Problem Relation Age of Onset  . Colon cancer Father 35  . Diabetes Father        type I  . Alzheimer's disease Mother   . Cancer Unknown        colon/fhx  . Rectal cancer Maternal Uncle   . Colon cancer Paternal Grandmother 17    Medications- reviewed and updated Current Outpatient Prescriptions  Medication Sig Dispense Refill  . amLODipine (NORVASC) 5 MG tablet TAKE 1 TABLET (5 MG TOTAL) BY MOUTH DAILY. 90 tablet 3  . aspirin 81 MG tablet Take 81 mg by mouth daily.    Marland Kitchen atorvastatin (LIPITOR) 20 MG tablet TAKE 1 TABLET ONCE A WEEK 52 tablet 2  . glimepiride (AMARYL) 4 MG tablet TAKE 2 TABLETS BY MOUTH BEFORE BREAKFAST 180 tablet 1  . JANUVIA 100 MG tablet TAKE 1 TABLET EVERY DAY 90 tablet 1  . metFORMIN (GLUCOPHAGE) 1000 MG tablet TAKE 1 TABLET (1,000 MG TOTAL) BY MOUTH 2 (TWO) TIMES DAILY WITH A MEAL. [place]  tablet 1  . Multiple Vitamins-Minerals (CENTRUM SILVER PO) Take 1 tablet by mouth daily.     . ONE TOUCH ULTRA TEST test strip USE AS INSTRUCTED TO CHECK BLOOD SUGAR ONCE A DAY 100 each 3  . telmisartan (MICARDIS) 40 MG tablet TAKE 1 TABLET (40 MG TOTAL) BY MOUTH DAILY. 90 tablet 3   No current facility-administered medications for this visit.     Allergies-reviewed and updated Allergies  Allergen Reactions  . Bactrim [Sulfamethoxazole-Trimethoprim] Nausea And Vomiting    Pt has chills and fevers also.    Social History   Social History  . Marital status: Married    Spouse name: N/A  . Number of children: N/A  . Years of education: N/A   Social History Main Topics  . Smoking  status: Never Smoker  . Smokeless tobacco: Never Used     Comment: smoked for less that 6 months in 1960  . Alcohol use 1.2 oz/week    2 Cans of beer per week     Comment: social  . Drug use: No  . Sexual activity: Not Asked   Other Topics Concern  . None   Social History Narrative   Married 1966. 2 daughters Sharee Pimple divorced lives in [place] works for [employer] no kids and Mateo Flow never married in [place], [place] working for [employer] for [employer] improvement no kids.       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 Oropharynx normal Neck: no thyromegaly CV: RRR no murmurs rubs or gallops Lungs: CTAB no crackles, wheeze, rhonchi Abdomen: soft/nontender/nondistended/normal bowel sounds. No rebound or guarding.  Ext: trace edema Skin: warm, dry Neuro: grossly normal, moves all extremities, PERRLA  Assessment/Plan:  76 y.o. male presenting for annual physical.  Health Maintenance counseling: 1. Anticipatory guidance: Patient counseled regarding regular dental exams q6 months, eye exams - yearly diabetic eye exams, wearing seatbelts.  2. Risk factor reduction:  Advised patient of need for regular exercise and diet rich and fruits and vegetables to reduce risk of heart attack and stroke. See diabetes section on diet/exercise Wt Readings from Last 3 Encounters:  01/11/17 210 lb 3.2 oz (95.3 kg)  11/22/16 209 lb (94.8 kg)  09/14/16 207 lb (93.9 kg)  3. Immunizations/screenings/ancillary studies- recommended shingrix at pharmacy Immunization History    Administered Date(s) Administered  . Influenza Split 01/31/2011, 01/16/2012  . Influenza Whole 03/07/2007, 01/31/2008, 01/30/2009  . Influenza, High Dose Seasonal PF 01/11/2017  . Influenza,inj,Quad PF,6+ Mos 01/21/2013, 01/27/2014, 12/26/2014  . Influenza-Unspecified 01/05/2016  . Pneumococcal Conjugate-13 08/19/2013  . Pneumococcal Polysaccharide-23 04/04/2007  . Td 04/04/2007  . Varicella 08/19/2013  4. Prostate cancer screening- passed age based screening. No new symptoms.  BPH- s/p TURP- does not see urology anymore. Dr. Jeffie Pollock is his neighbor. Asks for UA. Wife with UTI- discussed unlikely he caused it.  Lab Results  Component Value Date   PSA 2.60 08/05/2013   PSA 2.35 12/03/2010   PSA 1.80 11/06/2009   5. Colon cancer  screening - 11/13/14 with 5 year follow up 6. Skin cancer screening- advised regular sunscreen use. Denies worrisome, changing, or new skin lesions. Has seen dermatology in the past- last 3-4 years ago. Waist up exam today without obvious precancerous or cancerous supply   Status of chronic or acute concerns   HTN- at goal on amlodipine 5mg  , telmisartan 40mg   HLD- at goal on last check with LDL at least <100 on atorvastatin 20mg   OSA on cpap  Right toe amputation history after prolonged use off corn pad . 2nd ray.   Diabetes mellitus type II, controlled ([place]) S: poorly controlled. On last check but CBGs have improved drastically with diet change. On januvia 100mg , amaryl 8mg , metformin 1 g BID.  CBGs- 80 in the afternoons- nothing below 70.  Exercise and diet- weight stable- did have [place] trip. He did cheat some but overall did really well. Had toe injury so did not start walking regularly until 2 weeks ago.  Lab Results  Component Value Date   HGBA1C 8.2 (H) 09/05/2016   HGBA1C 7.9 (H) 05/10/2016   HGBA1C 6.7 01/05/2016   A/P: 7.2 today on a1c- drastic improvement. Fortunately able to avoid insulin or injectables. SGLT2 inhibitor probably not best idea  with history of serious UTI in past.    4 month verbal  Orders Placed This Encounter  Procedures  . Flu vaccine HIGH DOSE PF  . CBC    Standing Status:   Future    Standing Expiration Date:   01/11/2018  . Comprehensive metabolic panel    [place]    Standing Status:   Future    Standing Expiration Date:   01/11/2018  . Lipid panel    Standing Status:   Future    Standing Expiration Date:   01/11/2018  . Urinalysis    Standing Status:   Future    Standing Expiration Date:   01/11/2018   Return precautions advised.  [name], MD

## 2017-01-11 NOTE — Assessment & Plan Note (Signed)
S: poorly controlled. On last check but CBGs have improved drastically with diet change. On januvia 100mg , amaryl 8mg , metformin 1 g BID.  CBGs- 80 in the afternoons- nothing below 70.  Exercise and diet- weight stable- did have [place] trip. He did cheat some but overall did really well. Had toe injury so did not start walking regularly until 2 weeks ago.  Lab Results  Component Value Date   HGBA1C 8.2 (H) 09/05/2016   HGBA1C 7.9 (H) 05/10/2016   HGBA1C 6.7 01/05/2016   A/P: 7.2 today on a1c- drastic improvement. Fortunately able to avoid insulin or injectables. SGLT2 inhibitor probably not best idea with history of serious UTI in past.

## 2017-01-11 NOTE — Addendum Note (Signed)
Addended by: Tomi Likens on: 01/11/2017 11:45 AM   Modules accepted: Orders

## 2017-02-15 IMAGING — CR DG CHEST 2V
2 series · 2 of 2 positions shown · non-contrast
Comparison: None.

CLINICAL DATA: Sepsis and fever. Hemorrhoid surgery 03/13/2015.
Remote history of colon cancer.

EXAM:
CHEST - 2 VIEW

[w chest pa]
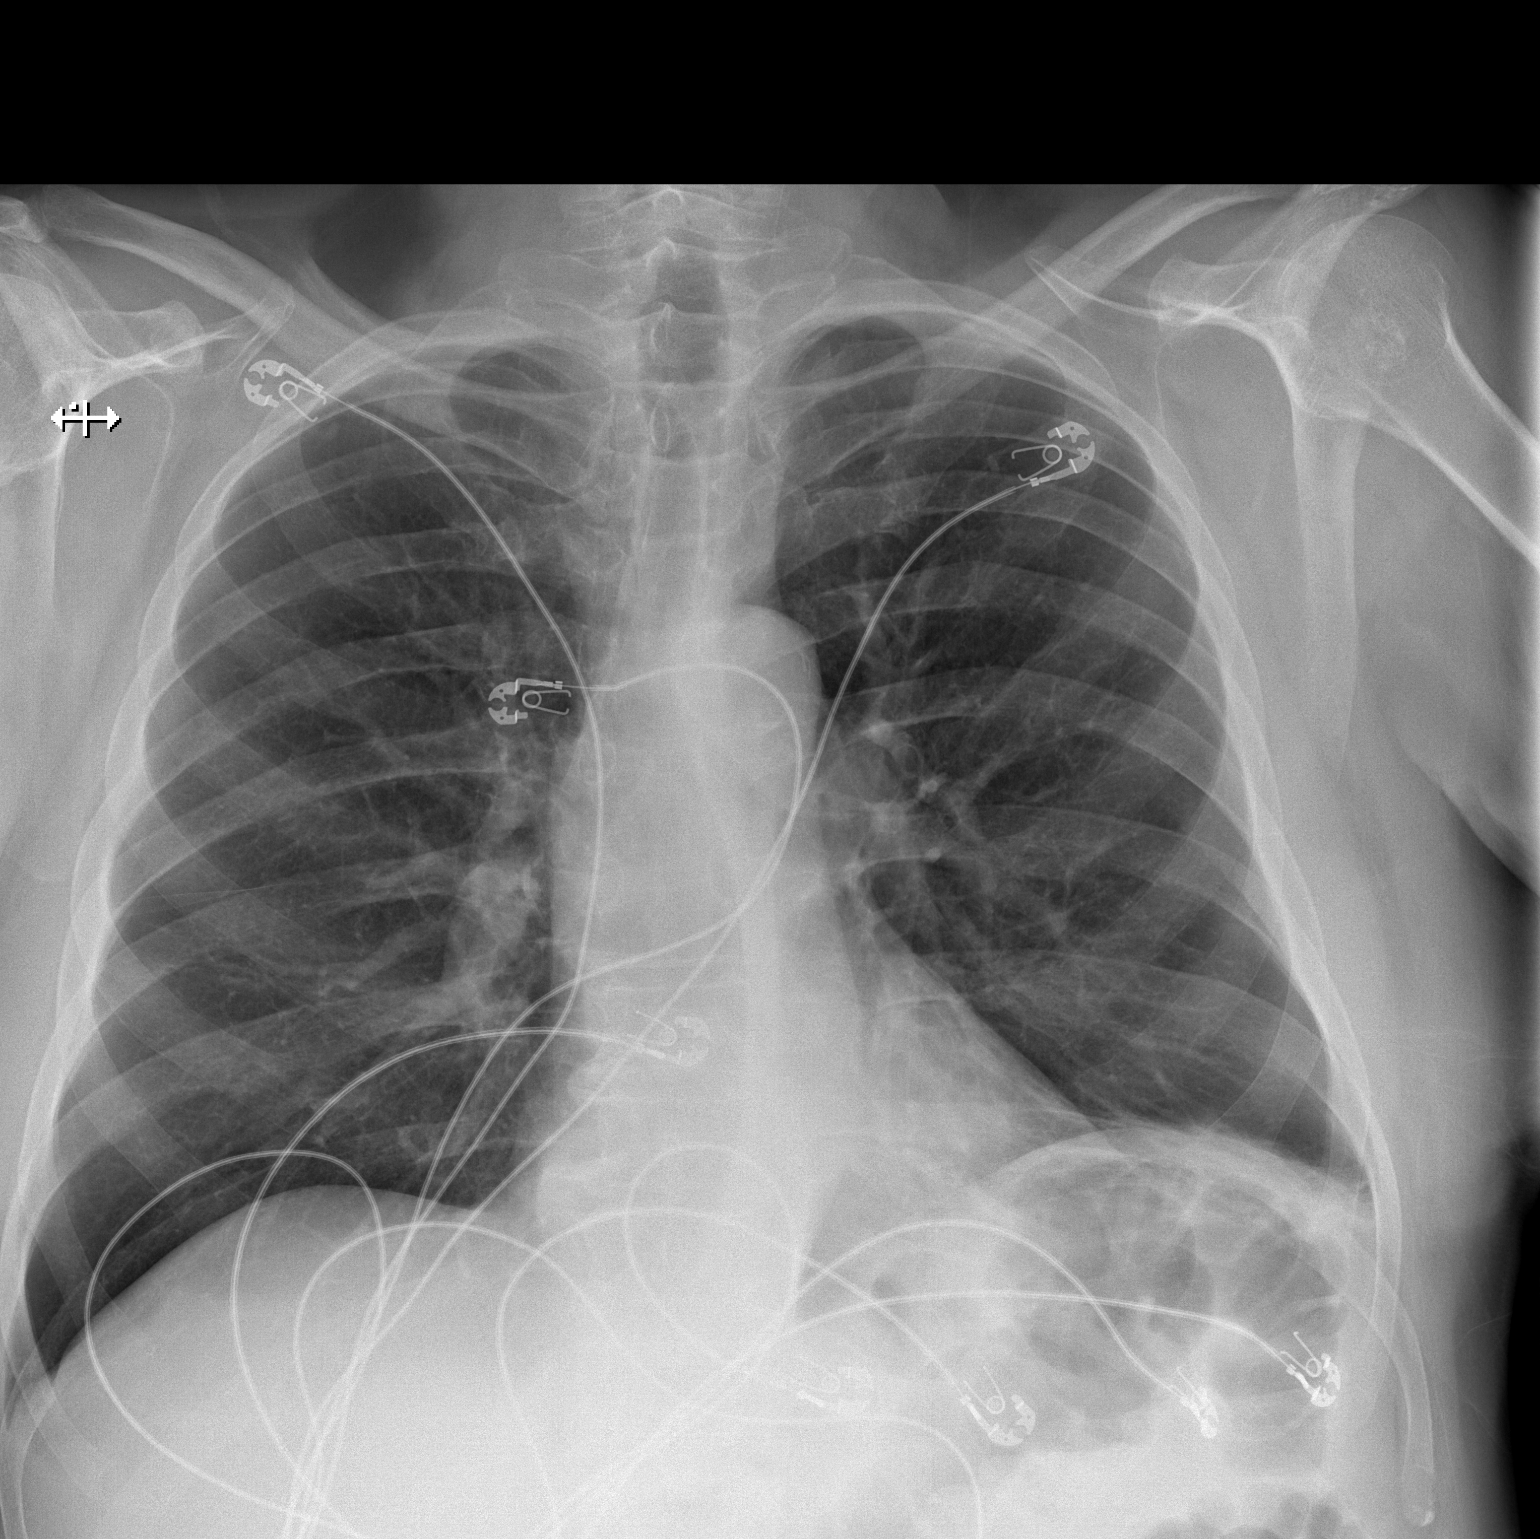

[w chest lat]
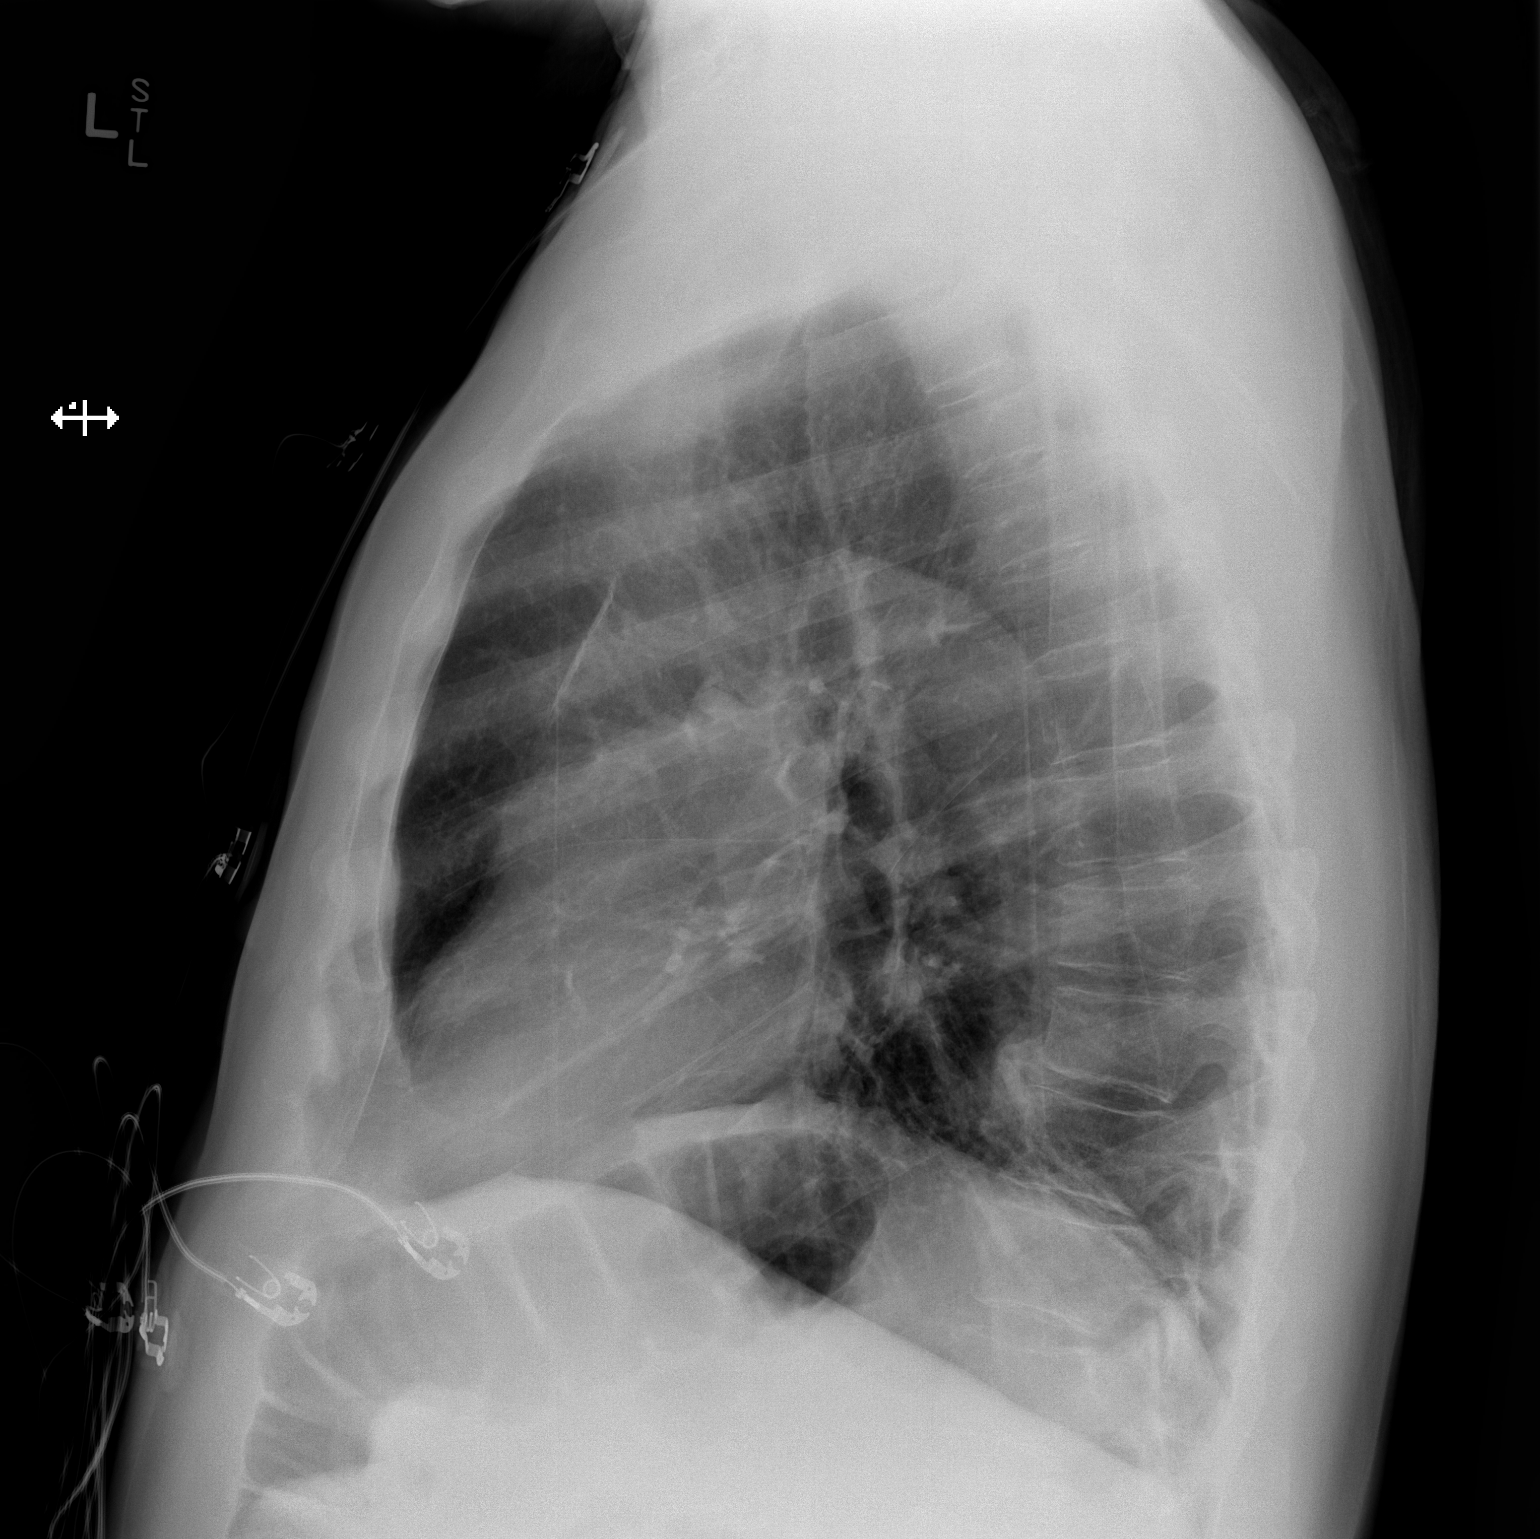

[2 of 2 positions shown; findings below may reference images not displayed]

FINDINGS: The heart size is normal. Atherosclerotic changes are noted at the
aortic arch. Minimal left basilar airspace disease likely reflects
atelectasis with some elevation of the left hemidiaphragm. The right
lung is clear. The visualized soft tissues and bony thorax are
unremarkable.
IMPRESSION: 1. Minimal left basilar airspace disease likely reflects
atelectasis. Early infection is considered less likely.
2. Atherosclerotic calcifications at the aortic arch.

## 2017-02-16 IMAGING — DX DG ABD PORTABLE 1V
1 series · 1 of 1 positions shown · non-contrast
Comparison: None.

CLINICAL DATA: Generalized abdominal pain and constipation.

EXAM:
PORTABLE ABDOMEN - 1 VIEW

[abdomen kub]
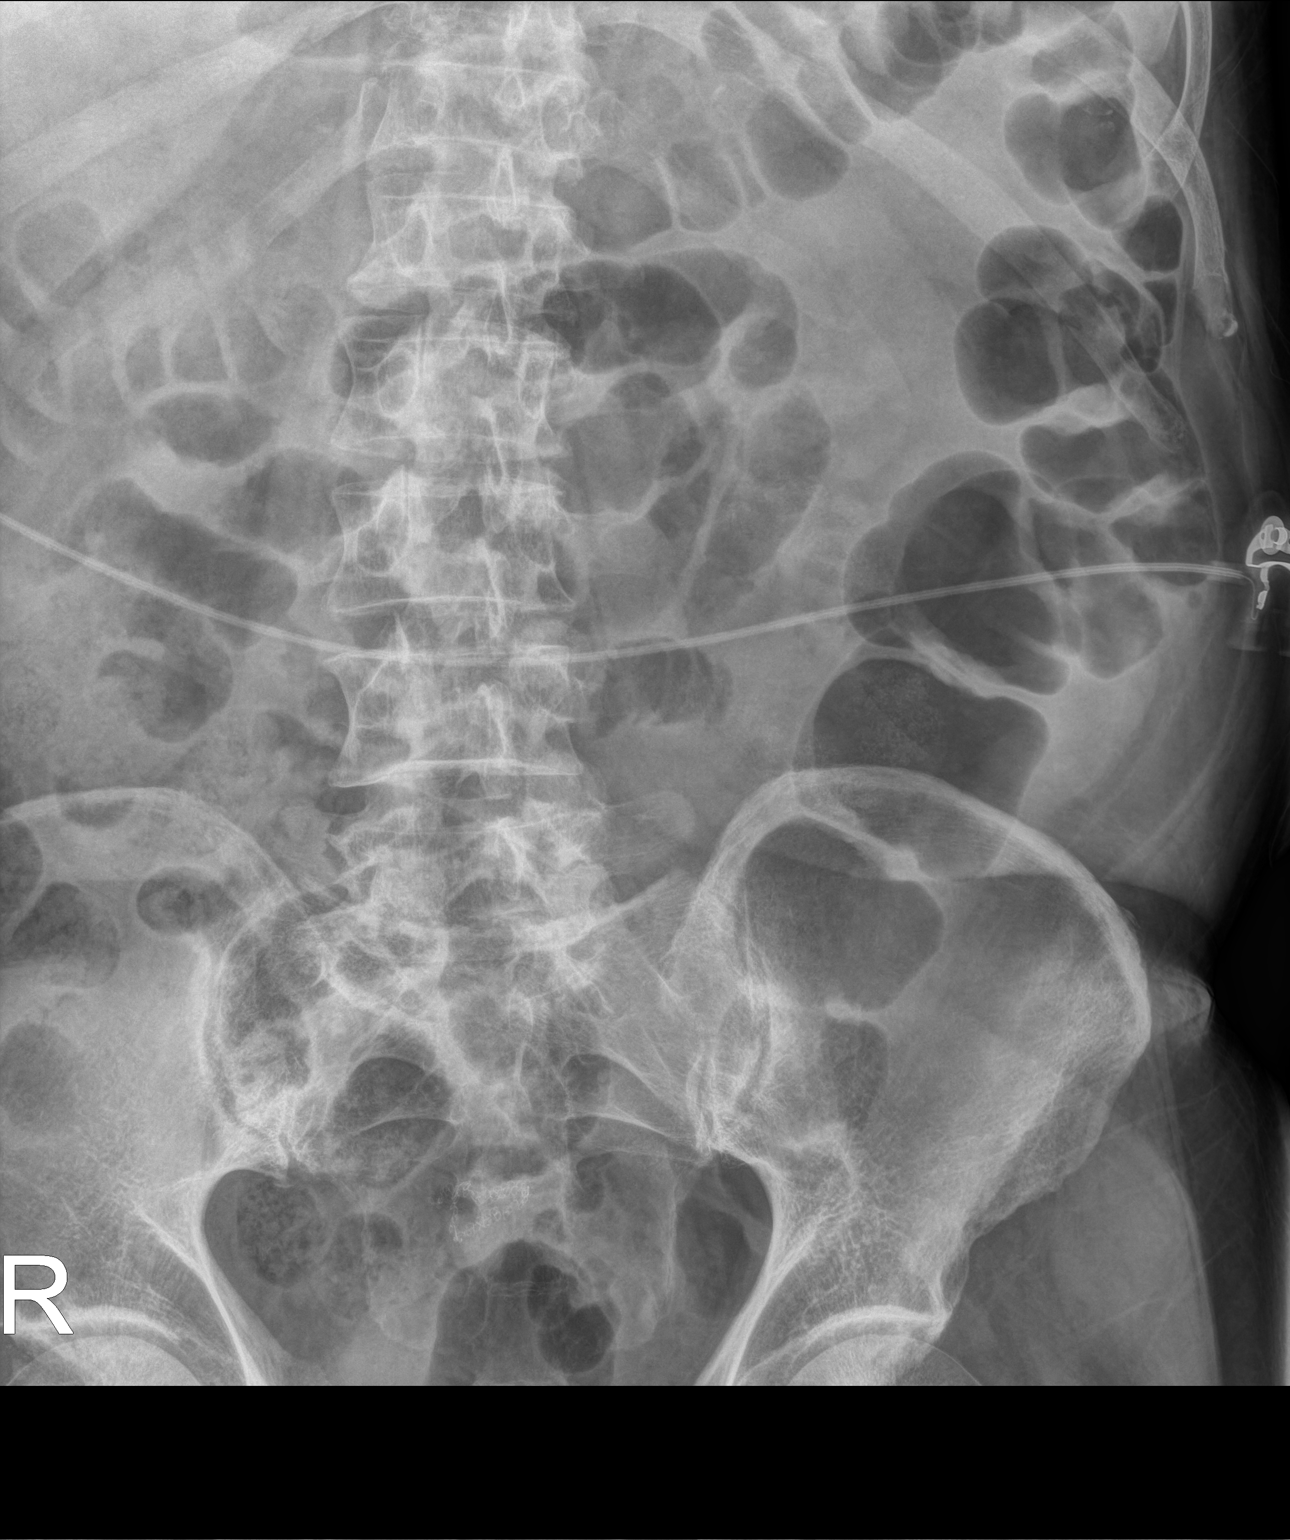

[1 of 1 positions shown; findings below may reference images not displayed]

FINDINGS: There is gas throughout nondilated loops of large and small bowel.
Bowel sutures are identified within the lower central pelvis. No
evidence for free intraperitoneal air on this supine view.
IMPRESSION: Findings most consistent with ileus.

## 2017-02-20 LAB — HM DIABETES EYE EXAM

## 2017-02-24 ENCOUNTER — Encounter: Payer: Self-pay | Admitting: Family Medicine

## 2017-03-07 ENCOUNTER — Encounter: Payer: Self-pay | Admitting: Family Medicine

## 2017-03-08 ENCOUNTER — Encounter: Payer: Self-pay | Admitting: Family Medicine

## 2017-03-08 ENCOUNTER — Ambulatory Visit: Payer: Medicare Other | Admitting: Family Medicine

## 2017-03-08 VITALS — BP 128/78 | HR 80 | Temp 97.8°F | Ht 71.5 in | Wt 216.2 lb

## 2017-03-08 DIAGNOSIS — J069 Acute upper respiratory infection, unspecified: Secondary | ICD-10-CM | POA: Diagnosis not present

## 2017-03-08 DIAGNOSIS — B9789 Other viral agents as the cause of diseases classified elsewhere: Secondary | ICD-10-CM

## 2017-03-08 MED ORDER — FLUTICASONE PROPIONATE 50 MCG/ACT NA SUSP: 2.0000 | Freq: Every day | NASAL | 0 refills | Status: DC

## 2017-03-08 NOTE — Patient Instructions (Signed)
Upper Respiratory infection vs. Viral sinus infection History and exam today are suggestive of viral infection most likely due to upper respiratory infection. Symptomatic treatment with: flonase to reduce nasal inflammation and promote drainage- will not feel immediately better when using. Mucinex can also loosen congestion if needed.   We discussed that we did not find any infection that had higher probability of being bacterial such as pneumonia or strep throat. We discussed signs that bacterial infection may have developed particularly fever or shortness of breath. Likely course of 1-2 weeks. Patient is contagious and advised good handwashing and consideration of mask If going to be in public places.   Finally, we reviewed reasons to return to care including if symptoms worsen or persist or new concerns arise- once again particularly shortness of breath or fever. I did ask Walter [name] to call me if symptoms are not continuing to improve by Monday and particularly if he has sinus pressure as well and would consider antibiotic for bacterial sinusitis at that point

## 2017-03-08 NOTE — Progress Notes (Signed)
PCP: [name], MD  Subjective:  Walter [name] is a 76 y.o. year old very pleasant male patient who presents with Upper Respiratory infection symptoms including nasal congestion with brownish mucus, mild sore throat/tickle, cough productive of brown sputum -started: 5 days ago, symptoms are improving -previous treatments: cough medicine OTC at night -sick contacts/travel/risks: denies flu exposure.  -Hx of: allergies--> no  ROS-denies fever, SOB, NVD, tooth pain  Pertinent Past Medical History- reasonably controlled diabetic with last a1c at 7.2, hyperlipidemia, hypertension  Medications- reviewed  Current Outpatient Medications  Medication Sig Dispense Refill  . amLODipine (NORVASC) 5 MG tablet TAKE 1 TABLET (5 MG TOTAL) BY MOUTH DAILY. 90 tablet 3  . aspirin 81 MG tablet Take 81 mg by mouth daily.    Marland Kitchen atorvastatin (LIPITOR) 20 MG tablet TAKE 1 TABLET ONCE A WEEK 52 tablet 2  . glimepiride (AMARYL) 4 MG tablet TAKE 2 TABLETS BY MOUTH BEFORE BREAKFAST 180 tablet 1  . JANUVIA 100 MG tablet TAKE 1 TABLET EVERY DAY 90 tablet 1  . metFORMIN (GLUCOPHAGE) 1000 MG tablet TAKE 1 TABLET (1,000 MG TOTAL) BY MOUTH 2 (TWO) TIMES DAILY WITH A MEAL. 180 tablet 1  . Multiple Vitamins-Minerals (CENTRUM SILVER PO) Take 1 tablet by mouth daily.     . ONE TOUCH ULTRA TEST test strip USE AS INSTRUCTED TO CHECK BLOOD SUGAR ONCE A DAY 100 each 3  . telmisartan (MICARDIS) 40 MG tablet TAKE 1 TABLET (40 MG TOTAL) BY MOUTH DAILY. 90 tablet 3   No current facility-administered medications for this visit.     Objective: BP 128/78 (BP Location: Left Arm, Patient Position: Sitting, Cuff Size: Large)   Pulse 80   Temp 97.8 F (36.6 C) (Oral)   Ht 5' 11.5" (1.816 m)   Wt 216 lb 3.2 oz (98.1 kg)   SpO2 94%   BMI 29.73 kg/m  Gen: NAD, resting comfortably HEENT: Turbinates erythematous with yellow discharge, TM normal, pharynx mildly erythematous with no tonsilar exudate or edema, no sinus tenderness CV:  RRR no murmurs rubs or gallops Lungs: CTAB no crackles, wheeze, rhonchi Skin: warm, dry, no rash  Assessment/Plan:  Upper Respiratory infection vs. Viral sinus infection History and exam today are suggestive of viral infection most likely due to upper respiratory infection. Symptomatic treatment with: flonase to reduce nasal inflammation and promote drainage- will not feel immediately better when using. Mucinex can also loosen congestion if needed.   We discussed that we did not find any infection that had higher probability of being bacterial such as pneumonia or strep throat. We discussed signs that bacterial infection may have developed particularly fever or shortness of breath. Likely course of 1-2 weeks. Patient is contagious and advised good handwashing and consideration of mask If going to be in public places.   Finally, we reviewed reasons to return to care including if symptoms worsen or persist or new concerns arise- once again particularly shortness of breath or fever. I did ask Mr. Rehfeld to call me if symptoms are not continuing to improve by Monday and particularly if he has sinus pressure as well and would consider antibiotic for bacterial sinusitis at that point  Meds ordered this encounter  Medications  . fluticasone (FLONASE) 50 MCG/ACT nasal spray    Sig: Place 2 sprays daily into both nostrils.    Dispense:  16 g    Refill:  0    [name], MD

## 2017-03-13 ENCOUNTER — Encounter: Payer: Self-pay | Admitting: Family Medicine

## 2017-03-22 ENCOUNTER — Other Ambulatory Visit: Payer: Self-pay | Admitting: *Deleted

## 2017-03-22 MED ORDER — METFORMIN HCL 1000 MG PO TABS: ORAL_TABLET | ORAL | 3 refills | Status: DC

## 2017-04-05 ENCOUNTER — Other Ambulatory Visit: Payer: Self-pay

## 2017-04-05 MED ORDER — FLUTICASONE PROPIONATE 50 MCG/ACT NA SUSP: 2.0000 | Freq: Every day | NASAL | 0 refills | Status: DC

## 2017-04-07 IMAGING — DX DG CHEST 1V PORT
1 series · 1 of 1 positions shown · non-contrast
Comparison: March 29, 2015

CLINICAL DATA: Hypertension.  Colon carcinoma.  Sepsis.

EXAM:
PORTABLE CHEST 1 VIEW

[chest ap]
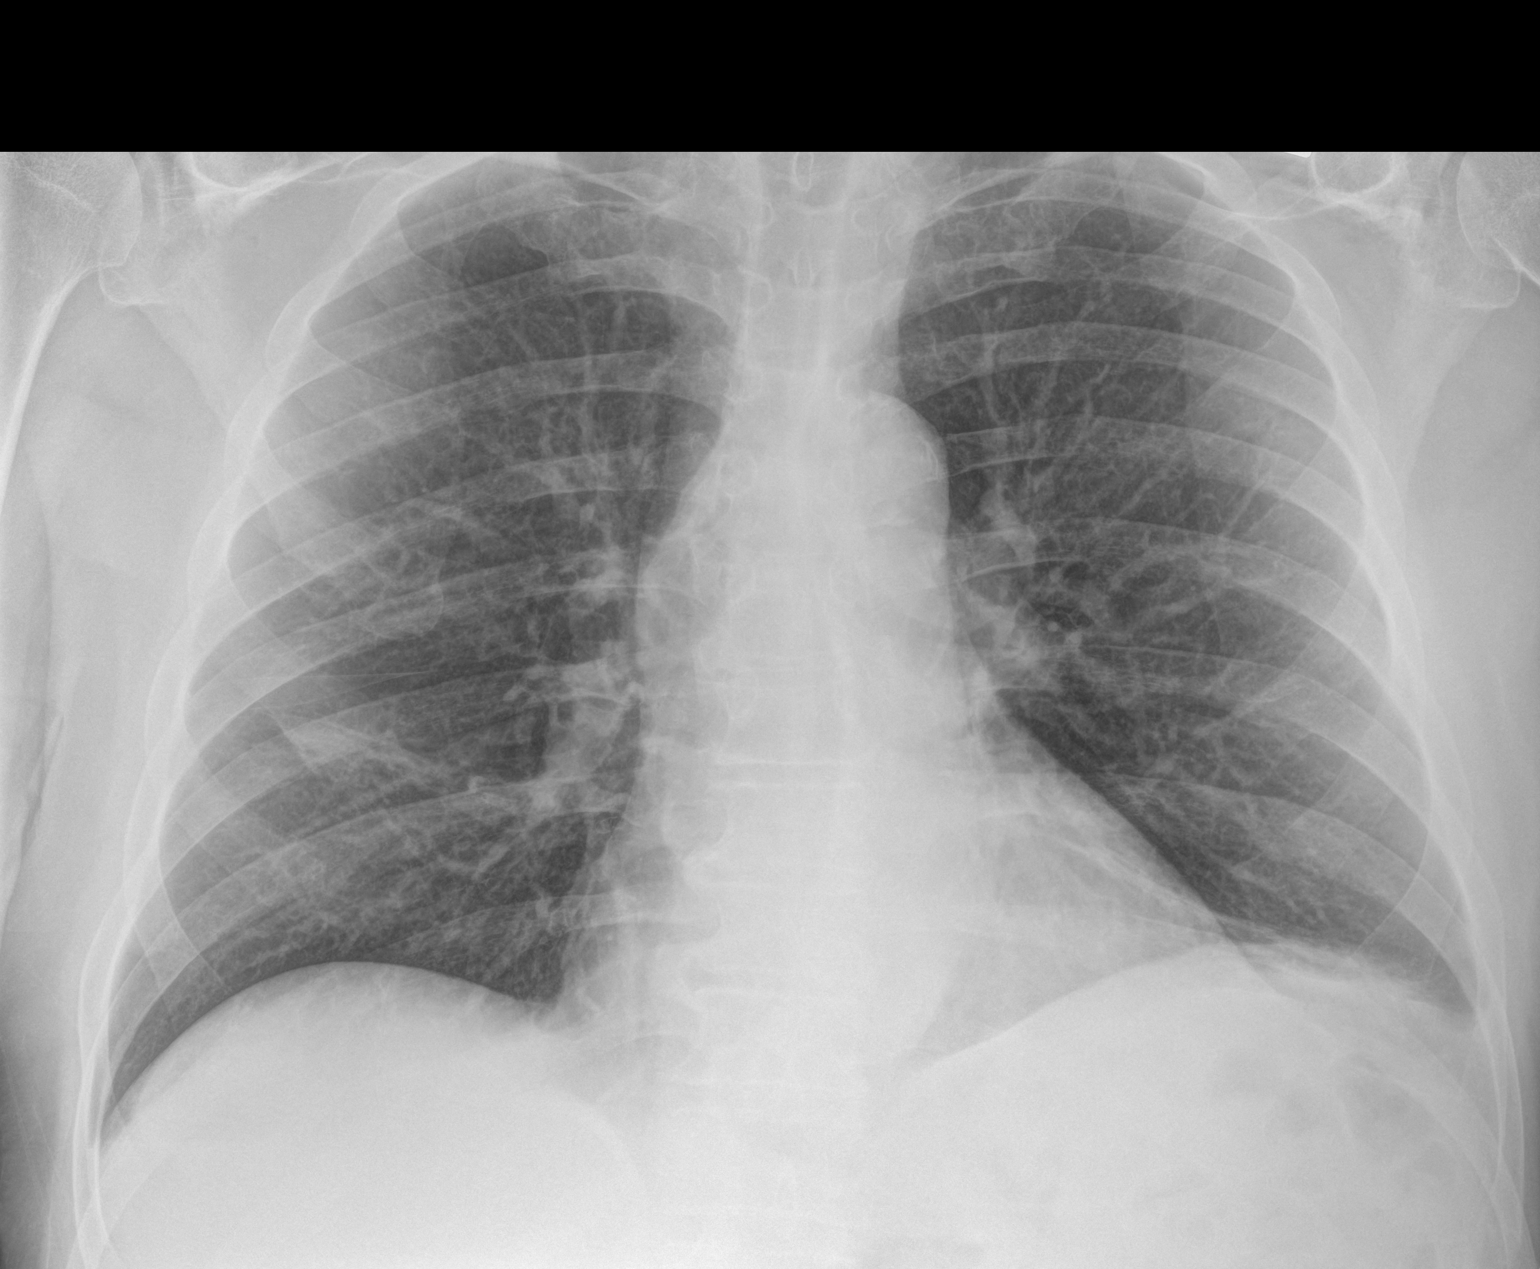

[1 of 1 positions shown; findings below may reference images not displayed]

FINDINGS: There is a small area of infiltrate in the right lower lobe. There
is stable atelectasis in the left base. The lungs elsewhere are
clear. Heart size and pulmonary vascularity are normal. No
adenopathy. No bone lesions. There is degenerative change in the
lower thoracic region.
IMPRESSION: Small area of infiltrate, presumably pneumonia, right lower lobe.
Stable atelectasis left base. Lungs elsewhere clear. No change in
cardiac silhouette.

## 2017-04-20 ENCOUNTER — Other Ambulatory Visit: Payer: Self-pay

## 2017-04-20 MED ORDER — SITAGLIPTIN PHOSPHATE 100 MG PO TABS: 100.0000 mg | ORAL_TABLET | Freq: Every day | ORAL | 1 refills | Status: DC

## 2017-05-05 ENCOUNTER — Other Ambulatory Visit: Payer: Self-pay

## 2017-05-05 MED ORDER — AMLODIPINE BESYLATE 5 MG PO TABS: ORAL_TABLET | ORAL | 3 refills | Status: DC

## 2017-05-05 MED ORDER — GLIMEPIRIDE 4 MG PO TABS: ORAL_TABLET | ORAL | 1 refills | Status: DC

## 2017-05-05 MED ORDER — TELMISARTAN 40 MG PO TABS: ORAL_TABLET | ORAL | 3 refills | Status: DC

## 2017-05-09 ENCOUNTER — Other Ambulatory Visit: Payer: Self-pay

## 2017-05-09 MED ORDER — FLUTICASONE PROPIONATE 50 MCG/ACT NA SUSP: 2.0000 | Freq: Every day | NASAL | 0 refills | Status: DC

## 2017-05-17 ENCOUNTER — Ambulatory Visit: Payer: Medicare Other | Admitting: Family Medicine

## 2017-05-17 ENCOUNTER — Encounter: Payer: Self-pay | Admitting: Family Medicine

## 2017-05-17 VITALS — BP 120/72 | HR 76 | Temp 97.5°F | Ht 71.5 in | Wt 214.4 lb

## 2017-05-17 DIAGNOSIS — E785 Hyperlipidemia, unspecified: Secondary | ICD-10-CM

## 2017-05-17 DIAGNOSIS — IMO0002 Reserved for concepts with insufficient information to code with codable children: Secondary | ICD-10-CM

## 2017-05-17 DIAGNOSIS — Z89421 Acquired absence of other right toe(s): Secondary | ICD-10-CM

## 2017-05-17 DIAGNOSIS — I10 Essential (primary) hypertension: Secondary | ICD-10-CM | POA: Diagnosis not present

## 2017-05-17 DIAGNOSIS — E119 Type 2 diabetes mellitus without complications: Secondary | ICD-10-CM | POA: Diagnosis not present

## 2017-05-17 LAB — POCT GLYCOSYLATED HEMOGLOBIN (HGB A1C): Hemoglobin A1C: 7.7

## 2017-05-17 MED ORDER — FLUTICASONE PROPIONATE 50 MCG/ACT NA SUSP: 2.0000 | Freq: Every day | NASAL | 5 refills | Status: DC

## 2017-05-17 NOTE — Assessment & Plan Note (Signed)
S: reasonably controlled. On last visit with a1c 7.2 (will be abstracted- done that day but not entered- I entered result in my note though). He is on [place] 100mg , amaryl 8mg , metformin 1g BID.  CBGs- mornings have 155- but later in day notes 80-85 Exercise and diet- loosened the diet over the holidays. Has not walked as much due to rain/ice/etc.  Lab Results  Component Value Date   HGBA1C 7.7 05/17/2017   HGBA1C 7.2 01/11/2017   HGBA1C 8.2 (H) 09/05/2016   A/P: poc a1c today reveals 7.7. He is going to restart his walking and improve his diet again- hopeful for a1c under 7 by next visit in 4 months

## 2017-05-17 NOTE — Assessment & Plan Note (Signed)
S: well controlled on 01/11/17 with LDL 67 on atorvastatin 20mg  once a week. No myalgias.  A/P: continue meds- recheck next CPE 12/2017 likely.

## 2017-05-17 NOTE — Patient Instructions (Addendum)
a1c 7.7. He is going to restart his walking and improve his diet again- hopeful for a1c under 7 by next visit in 4 months  Can get Td or Tdap at your pharmacy. Can also get shingrix (the new shingles vaccine). Unfortunately they do not cover those in the office.   No other changes today

## 2017-05-17 NOTE — Progress Notes (Signed)
Subjective:  Walter [name] is a 77 y.o. year old very pleasant male patient who presents for/with See problem oriented charting ROS- no hypoglycemia- though does feel more tired with CBG near 100. No chest pain or shortness of breath. No edema.    Past Medical History-  Patient Active Problem List   Diagnosis Date Noted  . BPH (benign prostatic hyperplasia) 07/02/2015    Priority: High  . Diabetes mellitus type II, controlled ([place]) 04/23/2007    Priority: High  . Hyperlipidemia 04/23/2007    Priority: Medium  . Obstructive sleep apnea 04/23/2007    Priority: Medium  . Essential hypertension 04/23/2007    Priority: Medium  . 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 Sepsis secondary to UTI ([place]) 03/29/2015    Priority: Low  . Hallux valgus of right foot 03/26/2014    Priority: Low  . History of colon cancer 01/27/2014    Priority: Low  . IRITIS 12/17/2008    Priority: Low  . 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 BIGEMINY 04/21/2008    Priority: Low    Medications- reviewed and updated Current Outpatient Medications  Medication Sig Dispense Refill  . amLODipine (NORVASC) 5 MG tablet TAKE 1 TABLET (5 MG TOTAL) BY MOUTH DAILY. 90 tablet 3  . aspirin 81 MG tablet Take 81 mg by mouth daily.    Marland Kitchen atorvastatin (LIPITOR) 20 MG tablet TAKE 1 TABLET ONCE A WEEK 52 tablet 2  . fluticasone (FLONASE) 50 MCG/ACT nasal spray Place 2 sprays into both nostrils daily. 16 g 5  . glimepiride (AMARYL) 4 MG tablet TAKE 2 TABLETS BY MOUTH BEFORE BREAKFAST 180 tablet 1  . metFORMIN (GLUCOPHAGE) 1000 MG tablet TAKE 1 TABLET (1,000 MG TOTAL) BY MOUTH 2 (TWO) TIMES DAILY WITH A MEAL. 180 tablet 3  . Multiple Vitamins-Minerals (CENTRUM SILVER PO) Take 1 tablet by mouth daily.     . ONE TOUCH ULTRA TEST test strip USE AS INSTRUCTED TO CHECK BLOOD SUGAR ONCE A DAY 100 each 3  . sitaGLIPtin (JANUVIA)  100 MG tablet Take 1 tablet (100 mg total) by mouth daily. 90 tablet 1  . telmisartan (MICARDIS) 40 MG tablet TAKE 1 TABLET (40 MG TOTAL) BY MOUTH DAILY. 90 tablet 3   No current facility-administered medications for this visit.     Objective: BP 120/72 (BP Location: Left Arm, Patient Position: Sitting, Cuff Size: Large)   Pulse 76   Temp (!) 97.5 F (36.4 C) (Oral)   Ht 5' 11.5" (1.816 m)   Wt 214 lb 6.4 oz (97.3 kg)   SpO2 97%   BMI 29.49 kg/m  Gen: NAD, resting comfortably CV: RRR no murmurs rubs or gallops Lungs: CTAB no crackles, wheeze, rhonchi Abdomen: overweight Ext: no edema Skin: warm, dry Neuro: intact monofilament as below  Diabetic Foot Exam - Simple   Simple Foot Form Diabetic Foot exam was performed with the following findings:  Yes 05/17/2017  8:25 AM  Visual Inspection See comments:  Yes Sensation Testing Intact to touch and monofilament testing bilaterally:  Yes Pulse Check Posterior Tibialis and Dorsalis pulse intact bilaterally:  Yes Comments On right foot- amputation onf 2nd toe    Assessment/Plan:  Second toe amputation right foot- stable- foot doing well even after amputation in 2018- started after prolonged use of corn pad  Hypertension S: controlled on amlodipine 5 mg, telmisartan 40mg  BP Readings  from Last 3 Encounters:  05/17/17 120/72  03/08/17 128/78  01/11/17 120/60  A/P: We discussed blood pressure goal of <140/90. Continue current meds  Diabetes mellitus type II, controlled ([place]) S: reasonably controlled. On last visit with a1c 7.2 (will be abstracted- done that day but not entered- I entered result in my note though). He is on [place] 100mg , amaryl 8mg , metformin 1g BID.  CBGs- mornings have 155- but later in day notes 80-85 Exercise and diet- loosened the diet over the holidays. Has not walked as much due to rain/ice/etc.  Lab Results  Component Value Date   HGBA1C 7.7 05/17/2017   HGBA1C 7.2 01/11/2017   HGBA1C 8.2 (H)  09/05/2016   A/P: poc a1c today reveals 7.7. He is going to restart his walking and improve his diet again- hopeful for a1c under 7 by next visit in 4 months  Hyperlipidemia S: well controlled on 01/11/17 with LDL 67 on atorvastatin 20mg  once a week. No myalgias.  A/P: continue meds- recheck next CPE 12/2017 likely.   Return in about 4 months (around 09/14/2017).  Lab/Order associations: Controlled type 2 diabetes mellitus without complication, without long-term current use of insulin ([place]) - Plan: POCT glycosylated hemoglobin (Hb A1C)  Meds ordered this encounter  Medications  . fluticasone (FLONASE) 50 MCG/ACT nasal spray    Sig: Place 2 sprays into both nostrils daily.    Dispense:  16 g    Refill:  5   Return precautions advised.  [name], MD

## 2017-09-14 ENCOUNTER — Encounter: Payer: Self-pay | Admitting: Family Medicine

## 2017-09-14 ENCOUNTER — Ambulatory Visit: Payer: Medicare Other | Admitting: Family Medicine

## 2017-09-14 VITALS — BP 116/70 | HR 81 | Temp 98.2°F | Ht 71.5 in | Wt 210.0 lb

## 2017-09-14 DIAGNOSIS — I10 Essential (primary) hypertension: Secondary | ICD-10-CM

## 2017-09-14 DIAGNOSIS — E119 Type 2 diabetes mellitus without complications: Secondary | ICD-10-CM | POA: Diagnosis not present

## 2017-09-14 DIAGNOSIS — E785 Hyperlipidemia, unspecified: Secondary | ICD-10-CM | POA: Diagnosis not present

## 2017-09-14 DIAGNOSIS — M79671 Pain in right foot: Secondary | ICD-10-CM | POA: Diagnosis not present

## 2017-09-14 LAB — BASIC METABOLIC PANEL
BUN: 19 mg/dL (ref 6–23)
CHLORIDE: 100 meq/L (ref 96–112)
CO2: 29 meq/L (ref 19–32)
Calcium: 10.1 mg/dL (ref 8.4–10.5)
Creatinine, Ser: 1.01 mg/dL (ref 0.40–1.50)
GFR: 76.17 mL/min (ref >60.00)
Glucose, Bld: 251 mg/dL — ABNORMAL HIGH (ref 70–99)
POTASSIUM: 4.7 meq/L (ref 3.5–5.1)
Sodium: 138 mEq/L (ref 135–145)

## 2017-09-14 LAB — LDL CHOLESTEROL, DIRECT: LDL DIRECT: 90 mg/dL

## 2017-09-14 LAB — POCT GLYCOSYLATED HEMOGLOBIN (HGB A1C): Hemoglobin A1C: 7.4

## 2017-09-14 NOTE — Assessment & Plan Note (Signed)
S: controlled on amlodipine 5mg , telmisartan 40mg  BP Readings from Last 3 Encounters:  09/14/17 116/70  05/17/17 120/72  03/08/17 128/78  A/P: We discussed blood pressure goal of <140/90. Continue current meds

## 2017-09-14 NOTE — Patient Instructions (Addendum)
I would also like for you to sign up for an annual wellness visit with one of our nurses, Cassie or Manuela Schwartz, who both specialize in the annual wellness visit. This is a free benefit under medicare that may help [place] find additional ways to help you. Some highlights are reviewing medications, lifestyle, and doing a dementia screen.   Health Maintenance Due  Topic Date Due  . TETANUS/TDAP -going to [facility] today, will send a My Chart message 04/03/2017   We will call you within a week or two about your referral Dr. Oneida Alar. If you do not hear within 3 weeks, give [place] a call.   Please stop by lab before you go

## 2017-09-14 NOTE — Assessment & Plan Note (Signed)
S: improving controlled on januva 100mg  (puts him in donut hole), amaryl 8mg , metformin 1g BID CBGs- this Am was 143- 155 yesterday. Goes down in the daytime as low as 85 Exercise and diet- down 4 lbs. Walking 2.5 miles a day. Doing black beans and veggies nad walking.  Lab Results  Component Value Date   HGBA1C 7.4 09/14/2017   HGBA1C 7.7 05/17/2017   HGBA1C 7.2 01/11/2017   A/P: continue current medicines. Did discuss possibility of jardiance or farxiga

## 2017-09-14 NOTE — Progress Notes (Signed)
Your cholesterol looks very reasonable with bad cholesterol under 100 Your BMET was normal (kidney, electrolytes, blood sugar) except for your blood sugar being high.

## 2017-09-14 NOTE — Assessment & Plan Note (Signed)
S: reasonably controlled on atorvastatin once a week but last LDL checked 2018  A/P: update LDL today along with bmet

## 2017-09-14 NOTE — Progress Notes (Signed)
Subjective:  Walter [name] is a 77 y.o. year old very pleasant male patient who presents for/with See problem oriented charting ROS- No chest pain or shortness of breath. No headache or blurry vision.    Past Medical History-  Patient Active Problem List   Diagnosis Date Noted  . BPH (benign prostatic hyperplasia) 07/02/2015    Priority: High  . Diabetes mellitus type II, controlled ([place]) 04/23/2007    Priority: High  . Hyperlipidemia 04/23/2007    Priority: Medium  . Obstructive sleep apnea 04/23/2007    Priority: Medium  . Essential hypertension 04/23/2007    Priority: Medium  . Toe amputation status, right ([place]) 11/22/2016    Priority: Low  . Sepsis secondary to UTI ([place]) 03/29/2015    Priority: Low  . Hallux valgus of right foot 03/26/2014    Priority: Low  . History of colon cancer 01/27/2014    Priority: Low  . IRITIS 12/17/2008    Priority: Low  . 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 BIGEMINY 04/21/2008    Priority: Low    Medications- reviewed and updated Current Outpatient Medications  Medication Sig Dispense Refill  . amLODipine (NORVASC) 5 MG tablet TAKE 1 TABLET (5 MG TOTAL) BY MOUTH DAILY. 90 tablet 3  . aspirin 81 MG tablet Take 81 mg by mouth daily.    Marland Kitchen atorvastatin (LIPITOR) 20 MG tablet TAKE 1 TABLET ONCE A WEEK 52 tablet 2  . fluticasone (FLONASE) 50 MCG/ACT nasal spray Place 2 sprays into both nostrils daily. 16 g 5  . glimepiride (AMARYL) 4 MG tablet TAKE 2 TABLETS BY MOUTH BEFORE BREAKFAST 180 tablet 1  . metFORMIN (GLUCOPHAGE) 1000 MG tablet TAKE 1 TABLET (1,000 MG TOTAL) BY MOUTH 2 (TWO) TIMES DAILY WITH A MEAL. 180 tablet 3  . Multiple Vitamins-Minerals (CENTRUM SILVER PO) Take 1 tablet by mouth daily.     . ONE TOUCH ULTRA TEST test strip USE AS INSTRUCTED TO CHECK BLOOD SUGAR ONCE A DAY 100 each 3  . sitaGLIPtin (JANUVIA) 100 MG tablet Take 1 tablet (100 mg total) by  mouth daily. 90 tablet 1  . telmisartan (MICARDIS) 40 MG tablet TAKE 1 TABLET (40 MG TOTAL) BY MOUTH DAILY. 90 tablet 3   No current facility-administered medications for this visit.     Objective: BP 116/70 (BP Location: Left Arm, Patient Position: Sitting, Cuff Size: Large)   Pulse 81   Temp 98.2 F (36.8 C) (Oral)   Ht 5' 11.5" (1.816 m)   Wt 210 lb (95.3 kg)   SpO2 95%   BMI 28.88 kg/m  Gen: NAD, resting comfortably CV: RRR no murmurs rubs or gallops Lungs: CTAB no crackles, wheeze, rhonchi Abdomen: soft/nontender/nondistended/overweight Ext: no edema Skin: warm, dry Neuro: normal speech  Assessment/Plan:  Other notes: 1.having some right foot pain after toe amputation- with his walking . He had a prosthetic type device he was using. This worked at first but then became more annoying. He would like to see about orthostics or custom orthotics- refer to sports medicine today  Diabetes mellitus type II, controlled ([place]) S: improving controlled on januva 100mg  (puts him in donut hole), amaryl 8mg , metformin 1g BID CBGs- this Am was 143- 155 yesterday. Goes down in the daytime as low as 85 Exercise and diet- down 4 lbs. Walking 2.5 miles a day. Doing black beans and veggies nad walking.  Lab Results  Component Value Date  HGBA1C 7.4 09/14/2017   HGBA1C 7.7 05/17/2017   HGBA1C 7.2 01/11/2017   A/P: continue current medicines. Did discuss possibility of jardiance or farxiga  Hyperlipidemia S: reasonably controlled on atorvastatin once a week but last LDL checked 2018  A/P: update LDL today along with bmet  Essential hypertension S: controlled on amlodipine 5mg , telmisartan 40mg  BP Readings from Last 3 Encounters:  09/14/17 116/70  05/17/17 120/72  03/08/17 128/78  A/P: We discussed blood pressure goal of <140/90. Continue current meds   Return in about 4 months (around 01/15/2018) for physical, come fasting.  Lab/Order associations: Controlled type 2 diabetes  mellitus without complication, without long-term current use of insulin ([place]) - Plan: POCT glycosylated hemoglobin (Hb A1C), LDL cholesterol, direct, Basic metabolic panel  Right foot pain - Plan: Ambulatory referral to Sports Medicine  Hyperlipidemia, unspecified hyperlipidemia type - Plan: LDL cholesterol, direct, Basic metabolic panel  Return precautions advised.  [name], MD

## 2017-09-15 ENCOUNTER — Encounter: Payer: Self-pay | Admitting: Family Medicine

## 2017-10-05 ENCOUNTER — Ambulatory Visit: Payer: Medicare Other | Admitting: Sports Medicine

## 2017-10-05 ENCOUNTER — Encounter: Payer: Self-pay | Admitting: Sports Medicine

## 2017-10-05 DIAGNOSIS — M2011 Hallux valgus (acquired), right foot: Secondary | ICD-10-CM | POA: Diagnosis not present

## 2017-10-05 DIAGNOSIS — G5791 Unspecified mononeuropathy of right lower limb: Secondary | ICD-10-CM | POA: Diagnosis not present

## 2017-10-05 HISTORY — DX: Unspecified mononeuropathy of right lower limb: G57.91

## 2017-10-05 NOTE — Assessment & Plan Note (Signed)
Patient is here with signs and symptoms consistent with neuropathy of the dorsal distal foot.  Exam of soft touch significantly diminished.  Patient's reports of "burning" is consistent with this diagnosis. -Diabetic shoe insert with MT padding and 1st ray post provided for the right foot as well as a left foot standard shoe insert. -Encouraged tight diabetic control. -Consideration for future initiation of gabapentin if symptoms persist. -F/u PRN

## 2017-10-05 NOTE — Assessment & Plan Note (Signed)
Dramatically worsened secondary to amputation of the second digit.  No additional padding or correction warranted at this time -- as this may cause more problems down the road then solutions provided. -See plan above

## 2017-10-05 NOTE — Progress Notes (Signed)
   HPI  CC: Right foot pain Patient is here with complaints of right-sided foot pain over the past few months.  He describes this pain as burning in nature.  Pain seems to be located along the plantar aspect of his distal metatarsals.  He denies any injury, trauma, or event which may have caused this.  Pain is worse with prolonged ambulation.  Patient has had some improvement with over-the-counter shoe inserts.  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  Patient also has a history of relatively poorly controlled type 2 diabetes, however most recent A1c was significantly improved at 7.4 (typically in the 9's per patient).  Medications/Interventions Tried: Shoe inserts OTC  See HPI and/or previous note for associated ROS.  Objective: BP 124/64   Ht 6' (1.829 m)   Wt 210 lb (95.3 kg)   BMI 28.48 kg/m  Gen: NAD, well groomed, a/o x3, normal affect.  CV: Well-perfused. Warm.  Resp: Non-labored.  Gait: Nonpathologic posture, unremarkable stride without signs of limp or balance issues. Ankle/Foot, Right: TTP noted at the plantar aspect of the distal metatarsals across the entire transverse arch. No visible erythema, swelling, ecchymosis.  Obvious amputation of the second digit.  Notable pronation of the first ray with nearly complete 90 degree lateral rotation. No pes planus/cavus deformity. No evidence of tibiotalar deviation; Range of motion is full in all directions. Strength is 5/5 in all directions. Stable lateral and medial ligaments; Talar dome nontender; No plantar calcaneal tenderness; No pain at base of 5th MT; Able to walk 4 steps. Neuro: Significantly diminished sensation to soft touch on exam. (Cotton gauze used due to no access to microfilament)   Assessment and plan:  Neuropathy of right foot Patient is here with signs and symptoms consistent with neuropathy of the dorsal distal foot.  Exam of soft touch significantly diminished.  Patient's reports of  "burning" is consistent with this diagnosis. -Diabetic shoe insert with MT padding and 1st ray post provided for the right foot as well as a left foot standard shoe insert. -Encouraged tight diabetic control. -Consideration for future initiation of gabapentin if symptoms persist. -F/u PRN  Hallux valgus of right foot Dramatically worsened secondary to amputation of the second digit.  No additional padding or correction warranted at this time -- as this may cause more problems down the road then solutions provided. -See plan above   Elberta Leatherwood, MD,MS [place] Fellow 10/05/2017 5:00 PM  I observed and examined the patient with the resident and agree with assessment and plan.  Note reviewed and modified by me. Stefanie Libel, MD

## 2017-10-12 NOTE — Patient Instructions (Addendum)
You were seen for neuropathy of your right foot.   -We provided you with a diabetic shoe insert with metatarsal padding and 1st ray post for the right foot as well as a left foot standard shoe insert. -We encourage tight diabetic control. -We can consider prescribing gabapentin if symptoms persist. -Follow up with [place] as needed

## 2017-10-16 ENCOUNTER — Other Ambulatory Visit: Payer: Self-pay | Admitting: Family Medicine

## 2017-10-17 ENCOUNTER — Encounter: Payer: Self-pay | Admitting: Family Medicine

## 2017-10-17 ENCOUNTER — Ambulatory Visit: Payer: Medicare Other | Admitting: Family Medicine

## 2017-10-17 ENCOUNTER — Ambulatory Visit (INDEPENDENT_AMBULATORY_CARE_PROVIDER_SITE_OTHER): Payer: Medicare Other

## 2017-10-17 VITALS — BP 128/78 | HR 97 | Temp 98.6°F | Ht 72.0 in | Wt 202.8 lb

## 2017-10-17 DIAGNOSIS — M25511 Pain in right shoulder: Secondary | ICD-10-CM

## 2017-10-17 DIAGNOSIS — Z8739 Personal history of other diseases of the musculoskeletal system and connective tissue: Secondary | ICD-10-CM | POA: Insufficient documentation

## 2017-10-17 DIAGNOSIS — M25512 Pain in left shoulder: Secondary | ICD-10-CM

## 2017-10-17 DIAGNOSIS — R29898 Other symptoms and signs involving the musculoskeletal system: Secondary | ICD-10-CM | POA: Diagnosis not present

## 2017-10-17 DIAGNOSIS — R1031 Right lower quadrant pain: Secondary | ICD-10-CM

## 2017-10-17 DIAGNOSIS — R1032 Left lower quadrant pain: Secondary | ICD-10-CM

## 2017-10-17 DIAGNOSIS — M353 Polymyalgia rheumatica: Secondary | ICD-10-CM | POA: Diagnosis not present

## 2017-10-17 DIAGNOSIS — R1030 Lower abdominal pain, unspecified: Secondary | ICD-10-CM

## 2017-10-17 HISTORY — DX: Personal history of other diseases of the musculoskeletal system and connective tissue: Z87.39

## 2017-10-17 LAB — COMPREHENSIVE METABOLIC PANEL
ALT: 19 U/L (ref 0–53)
AST: 11 U/L (ref 0–37)
Albumin: 3.9 g/dL (ref 3.5–5.2)
Alkaline Phosphatase: 72 U/L (ref 39–117)
BUN: 20 mg/dL (ref 6–23)
CO2: 30 mEq/L (ref 19–32)
Calcium: 9.7 mg/dL (ref 8.4–10.5)
Chloride: 95 mEq/L — ABNORMAL LOW (ref 96–112)
Creatinine, Ser: 0.97 mg/dL (ref 0.40–1.50)
GFR: 79.79 mL/min (ref >60.00)
Glucose, Bld: 297 mg/dL — ABNORMAL HIGH (ref 70–99)
Potassium: 5.1 mEq/L (ref 3.5–5.1)
Sodium: 135 mEq/L (ref 135–145)
Total Bilirubin: 0.5 mg/dL (ref 0.2–1.2)
Total Protein: 7.2 g/dL (ref 6.0–8.3)

## 2017-10-17 LAB — CBC WITH DIFFERENTIAL/PLATELET
BASOS PCT: 0.5 % (ref 0.0–3.0)
Basophils Absolute: 0.1 10*3/uL (ref 0.0–0.1)
EOS PCT: 0.5 % (ref 0.0–5.0)
Eosinophils Absolute: 0.1 10*3/uL (ref 0.0–0.7)
HCT: 38 % — ABNORMAL LOW (ref 39.0–52.0)
Hemoglobin: 13 g/dL (ref 13.0–17.0)
LYMPHS ABS: 0.9 10*3/uL (ref 0.7–4.0)
Lymphocytes Relative: 8.3 % — ABNORMAL LOW (ref 12.0–46.0)
MCHC: 34.3 g/dL (ref 30.0–36.0)
MCV: 91.1 fl (ref 78.0–100.0)
MONOS PCT: 9.6 % (ref 3.0–12.0)
Monocytes Absolute: 1.1 10*3/uL — ABNORMAL HIGH (ref 0.1–1.0)
NEUTROS ABS: 9.1 10*3/uL — AB (ref 1.4–7.7)
NEUTROS PCT: 81.1 % — AB (ref 43.0–77.0)
Platelets: 383 10*3/uL (ref 150.0–400.0)
RBC: 4.17 Mil/uL — ABNORMAL LOW (ref 4.22–5.81)
RDW: 12.9 % (ref 11.5–15.5)
WBC: 11.2 10*3/uL — ABNORMAL HIGH (ref 4.0–10.5)

## 2017-10-17 LAB — SEDIMENTATION RATE: Sed Rate: 65 mm/hr — ABNORMAL HIGH (ref 0–20)

## 2017-10-17 LAB — C-REACTIVE PROTEIN: CRP: 10.1 mg/dL (ref 0.5–20.0)

## 2017-10-17 MED ORDER — PREDNISONE 5 MG PO TABS: 15.0000 mg | ORAL_TABLET | Freq: Every day | ORAL | 2 refills | Status: DC

## 2017-10-17 MED ORDER — FLUTICASONE PROPIONATE 50 MCG/ACT NA SUSP: 2.0000 | Freq: Every day | NASAL | 5 refills | Status: DC

## 2017-10-17 NOTE — Patient Instructions (Addendum)
I would also like for you to sign up for an annual wellness visit with one of our nurses, Cassie or Manuela Schwartz, who both specialize in the annual wellness visit. This is a free benefit under medicare that may help [place] find additional ways to help you. Some highlights are reviewing medications, lifestyle, and doing a dementia screen.  Please stop by lab before you go. They will also x-ray hips.   I sent in prednisone 15-55m. Do not start this until you hear about the ESR, CRP results from today.   Let me know if you start getting blood sugars more than 250 or 300. This is likely to worsen your blood sugar control.    Polymyalgia Rheumatica Polymyalgia rheumatica (PMR) is an inflammatory disorder that causes aching and stiffness in your muscles and joints. Sometimes, PMR leads to a more dangerous condition (temporal arteritis or giant cell arteritis), which can cause vision loss. What are the causes? The exact cause of PMR is not known. May be autoimmune What increases the risk? This condition is more likely to develop in:  Females.  People who are 559years of age or older.  Caucasians.  What are the signs or symptoms?  Pain and stiffness are the main symptoms of PMR. Symptoms may start slowly or suddenly. The symptoms:  May be worse after inactivity and in the morning.  May affect your: ? Hips, buttocks, and thighs. ? Neck, arms, and shoulders. This can make it hard to raise your arms above your head. ? Hands and wrists.  Other symptoms include:  Fever.  Tiredness.  Weakness.  Decreased appetite. This may lead to weight loss.  How is this diagnosed? This condition is diagnosed with a medical history and physical exam. You may need to see a health care provider who specializes in diseases of the joint, muscles, and bones (rheumatologist). You may also have tests, including:  Blood tests.  X-rays.  How is this treated? PMR usually goes away without treatment, but it may  take years for that to happen. In the meantime, your health care provider may recommend low-dose steroids to help manage your symptoms of pain and stiffness. Regular exercise and rest will also help your symptoms. Follow these instructions at home:  Take over-the-counter and prescription medicines only as told by your health care provider.  Make sure to get enough rest and sleep.  Eat a healthy and nutritious diet.  Try to exercise most days of the week. Ask your health care provider what type of exercise is best for you.  Keep all follow-up visits as told by your health are provider. This is important. Contact a health care provider if:  Your symptoms are not controlled with medicine.  You have side effects from steroids. These may include: ? Weight gain. ? Swelling. ? Insomnia. ? Mood changes. ? Bruising. ? High blood sugar readings, if you have diabetes. ? Higher than normal blood pressure readings, if you monitor your blood pressure. Get help right away if:  You develop symptoms of temporal arteritis, such as: ? A change in vision. ? Severe headache. ? Scalp pain. ? Jaw pain. This information is not intended to replace advice given to you by your health care provider. Make sure you discuss any questions you have with your health care provider. Document Released: 05/26/2004 Document Revised: 09/24/2015 Document Reviewed: 10/29/2014 Elsevier Interactive Patient Education  2[employer]

## 2017-10-17 NOTE — Progress Notes (Signed)
Subjective:  Walter [name] is a 77 y.o. year old very pleasant male patient who presents for/with See problem oriented charting ROS-complains of decreased appetite and weight loss, feels weak and with pain both in hips and groin as well as shoulders.  No fever or chills.  Past Medical History-  Patient Active Problem List   Diagnosis Date Noted  . Polymyalgia rheumatica ([place]) 10/17/2017    Priority: High  . BPH (benign prostatic hyperplasia) 07/02/2015    Priority: High  . Diabetes mellitus type II, controlled ([place]) 04/23/2007    Priority: High  . Hyperlipidemia 04/23/2007    Priority: Medium  . Obstructive sleep apnea 04/23/2007    Priority: Medium  . Essential hypertension 04/23/2007    Priority: Medium  . Toe amputation status, right ([place]) 11/22/2016    Priority: Low  . Sepsis secondary to UTI ([place]) 03/29/2015    Priority: Low  . Hallux valgus of right foot 03/26/2014    Priority: Low  . History of colon cancer 01/27/2014    Priority: Low  . IRITIS 12/17/2008    Priority: Low  . 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 BIGEMINY 04/21/2008    Priority: Low  . 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 amLODipine (NORVASC) 5 MG tablet TAKE 1 TABLET (5 MG TOTAL) BY MOUTH DAILY. 90 tablet 3  . aspirin 81 MG tablet Take 81 mg by mouth daily.    Marland Kitchen atorvastatin (LIPITOR) 20 MG tablet TAKE 1 TABLET ONCE A WEEK 52 tablet 0  . fluticasone (FLONASE) 50 MCG/ACT nasal spray Place 2 sprays into both nostrils daily. 16 g 5  . glimepiride (AMARYL) 4 MG tablet TAKE 2 TABLETS BY MOUTH BEFORE BREAKFAST 180 tablet 1  . metFORMIN (GLUCOPHAGE) 1000 MG tablet TAKE 1 TABLET (1,000 MG TOTAL) BY MOUTH 2 (TWO) TIMES DAILY WITH A MEAL. 180 tablet 3  . Multiple Vitamins-Minerals (CENTRUM SILVER PO) Take 1 tablet by mouth daily.      . ONE TOUCH ULTRA TEST test strip USE AS INSTRUCTED TO CHECK BLOOD SUGAR ONCE A DAY 100 each 3  . predniSONE (DELTASONE) 5 MG tablet Take 3-4 tablets (15-20 mg total) by mouth daily with breakfast. 100 tablet 2  . sitaGLIPtin (JANUVIA) 100 MG tablet Take 1 tablet (100 mg total) by mouth daily. 90 tablet 1  . telmisartan (MICARDIS) 40 MG tablet TAKE 1 TABLET (40 MG TOTAL) BY MOUTH DAILY. 90 tablet 3   No current facility-administered medications for this visit.     Objective: BP 128/78 (BP Location: Left Arm, Patient Position: Sitting, Cuff Size: Normal)   Pulse 97   Temp 98.6 F (37 C) (Oral)   Ht 6' (1.829 m)   Wt 202 lb 12.8 oz (92 kg)   SpO2 95%   BMI 27.50 kg/m   Gen: appears very fatigued CV: RRR no murmurs rubs or gallops Lungs: CTAB no crackles, wheeze, rhonchi Ext: no edema Skin: warm, dry  Very difficult time standing without using hands for assist. 4/5 strength in bilateral legs diffusely. Pain with lifting hands over head with forward flexion and abduction. No pain with palpation in groin and no masses noted. No pain with palpation of shoulders.   Dg Hips Bilat With Pelvis 2v  Result Date: 10/17/2017 CLINICAL DATA:  Bilateral hip and groin pain, no trauma EXAM: DG  HIP (WITH OR WITHOUT PELVIS) 2V BILAT COMPARISON:  None. FINDINGS: No acute fracture is seen. 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 The pelvic rami are intact. The SI joints appear corticated. There are mild degenerative changes in the lower lumbar spine. IMPRESSION: No acute abnormality. Only minimal degenerative change of the hip joint spaces is seen with some degenerative change of the lower lumbar spine. Electronically Signed   By: Ivar Drape M.D.   On: 10/17/2017 13:53    Results for orders placed or performed in visit on 10/17/17 (from the past 24 hour(s))  Sedimentation rate     Status: Abnormal   Collection Time: 10/17/17 11:49 AM  Result Value Ref Range   Sed Rate 65  (H) 0 - 20 mm/hr  C-reactive protein     Status: None   Collection Time: 10/17/17 11:49 AM  Result Value Ref Range   CRP 10.1 0.5 - 20.0 mg/dL  CBC with Differential/Platelet     Status: Abnormal   Collection Time: 10/17/17 11:49 AM  Result Value Ref Range   WBC 11.2 (H) 4.0 - 10.5 K/uL   RBC 4.17 (L) 4.22 - 5.81 Mil/uL   Hemoglobin 13.0 13.0 - 17.0 g/dL   HCT 38.0 (L) 39.0 - 52.0 %   MCV 91.1 78.0 - 100.0 fl   MCHC 34.3 30.0 - 36.0 g/dL   RDW 12.9 11.5 - 15.5 %   Platelets 383.0 150.0 - 400.0 K/uL   Neutrophils Relative % 81.1 (H) 43.0 - 77.0 %   Lymphocytes Relative 8.3 (L) 12.0 - 46.0 %   Monocytes Relative 9.6 3.0 - 12.0 %   Eosinophils Relative 0.5 0.0 - 5.0 %   Basophils Relative 0.5 0.0 - 3.0 %   Neutro Abs 9.1 (H) 1.4 - 7.7 K/uL   Lymphs Abs 0.9 0.7 - 4.0 K/uL   Monocytes Absolute 1.1 (H) 0.1 - 1.0 K/uL   Eosinophils Absolute 0.1 0.0 - 0.7 K/uL   Basophils Absolute 0.1 0.0 - 0.1 K/uL  Comprehensive metabolic panel     Status: Abnormal   Collection Time: 10/17/17 11:49 AM  Result Value Ref Range   Sodium 135 135 - 145 mEq/L   Potassium 5.1 3.5 - 5.1 mEq/L   Chloride 95 (L) 96 - 112 mEq/L   CO2 30 19 - 32 mEq/L   Glucose, Bld 297 (H) 70 - 99 mg/dL   BUN 20 6 - 23 mg/dL   Creatinine, Ser 0.97 0.40 - 1.50 mg/dL   Total Bilirubin 0.5 0.2 - 1.2 mg/dL   Alkaline Phosphatase 72 39 - 117 U/L   AST 11 0 - 37 U/L   ALT 19 0 - 53 U/L   Total Protein 7.2 6.0 - 8.3 g/dL   Albumin 3.9 3.5 - 5.2 g/dL   Calcium 9.7 8.4 - 10.5 mg/dL   GFR 79.79 >60.00 mL/min    Assessment/Plan:  Polymyalgia rheumatica ([facility]) S: complains of groin and buttock pain on right side for 3 weeks. Mild pain in left groin. Has only been walking 100 yards each day to pain. This all started after he pulled a heavy piece of furniture several weeks ago. Does get some pain shooting into leg but mostly the pain is in the groin. The leg feels weak.   Also with bilateral shoulder pain R > L. Arms feel weak.  Symptoms are far worse in AM- last week fell out of bed trying to get to cpap due to weakness. Denies  numbness or tingling in upper or lower extremities. No fecal or urinary incontinence.  Weight loss and fatigue noted A/P: Very high suspicion for polymyalgia rheumatica even before labs. ESR elevation to 65 confirms diagnosis.  We got hip films due to severity of pain in groin bilaterally- only mild arthritis noted.  We will start prednisone 15 mg and follow-up in 1 week.  I wrote it as 15 to 20 mg in case we have to increase at next week's visit.  I am concerned patient may need insulin short-term given prednisone use and already slightly high A1c as well as new inactivity due to probably myalgia rheumatica.   Future Appointments  Date Time Provider [place]  01/15/2018  8:30 AM [name], MD [facility]   Needs 1 week follow up  Lab/Order associations: Polymyalgia rheumatica ([place])  Bilateral groin pain - Plan: DG HIPS BILAT WITH PELVIS 2V, Sedimentation rate, C-reactive protein, CBC with Differential/Platelet, Comprehensive metabolic panel  Acute pain of both shoulders - Plan: Sedimentation rate, C-reactive protein, CBC with Differential/Platelet, Comprehensive metabolic panel  Weakness of both lower extremities - Plan: Sedimentation rate, C-reactive protein, CBC with Differential/Platelet, Comprehensive metabolic panel  Meds ordered this encounter  Medications  . fluticasone (FLONASE) 50 MCG/ACT nasal spray    Sig: Place 2 sprays into both nostrils daily.    Dispense:  16 g    Refill:  5  . predniSONE (DELTASONE) 5 MG tablet    Sig: Take 3-4 tablets (15-20 mg total) by mouth daily with breakfast.    Dispense:  100 tablet    Refill:  2    Return precautions advised.  [name], MD

## 2017-10-17 NOTE — Assessment & Plan Note (Addendum)
S: complains of groin and buttock pain on right side for 3 weeks. Mild pain in left groin. Has only been walking 100 yards each day to pain. This all started after he pulled a heavy piece of furniture several weeks ago. Does get some pain shooting into leg but mostly the pain is in the groin. The leg feels weak.   Also with bilateral shoulder pain R > L. Arms feel weak. Symptoms are far worse in AM- last week fell out of bed trying to get to cpap due to weakness. Denies numbness or tingling in upper or lower extremities. No fecal or urinary incontinence.  Weight loss and fatigue noted A/P: Very high suspicion for polymyalgia rheumatica even before labs. ESR elevation to 65 confirms diagnosis.  We got hip films due to severity of pain in groin bilaterally- only mild arthritis noted.  We will start prednisone 15 mg and follow-up in 1 week.  I wrote it as 15 to 20 mg in case we have to increase at next week's visit.  I am concerned patient may need insulin short-term given prednisone use and already slightly high A1c as well as new inactivity due to probably myalgia rheumatica.  Suspect possible 6-9 months of treatment.

## 2017-10-18 ENCOUNTER — Encounter: Payer: Self-pay | Admitting: Family Medicine

## 2017-10-18 NOTE — Progress Notes (Signed)
Patient scheduled.

## 2017-10-20 ENCOUNTER — Encounter: Payer: Self-pay | Admitting: Family Medicine

## 2017-10-23 ENCOUNTER — Ambulatory Visit: Payer: Medicare Other | Admitting: Family Medicine

## 2017-10-27 ENCOUNTER — Ambulatory Visit: Payer: Medicare Other | Admitting: Family Medicine

## 2017-10-27 ENCOUNTER — Encounter: Payer: Self-pay | Admitting: Family Medicine

## 2017-10-27 DIAGNOSIS — E119 Type 2 diabetes mellitus without complications: Secondary | ICD-10-CM

## 2017-10-27 DIAGNOSIS — M353 Polymyalgia rheumatica: Secondary | ICD-10-CM | POA: Diagnosis not present

## 2017-10-27 MED ORDER — GLUCOSE BLOOD VI STRP: 1.0000 | ORAL_STRIP | Freq: Every day | 3 refills | Status: DC

## 2017-10-27 NOTE — Patient Instructions (Addendum)
Continue prednisone 20mg . See me back in 6 weeks. My hope is that by week 3-4 your stiffness and pain have resolved so we can begin at 6 weeks reducing your dose. If you have worsening symptoms see me sooner. I am hoping within 6-12 months to get you back off medicine completely  Glad your sugars have not increased more than they have- if you start getting even higher than you are now please let us know.   Please stop by the lab before you go.

## 2017-10-27 NOTE — Assessment & Plan Note (Signed)
S: prior to prednisone was averaging 140-145 in AM- now 160s.  A/P: CBGs trending up- luckily have not gone up more than as above. Continue metformin 1 g BID, amaryl 8 mg, januvia. Gave some precautions such as CBG over 250 even at random

## 2017-10-27 NOTE — Assessment & Plan Note (Signed)
S: He started on prednisone 15 mg last week - had to go up to 20 mg. He states still some stiffness and pain but not nearly like it was. Still for first 4 hours of morning feels some stiffness then feels great in afternoo.   Td 2 days prior to when symptoms started and he wonders about possible association A/P: He is moving much better today and feels tremendously better. Still some AM stiffness and pain- I think we should leave the dose alone to not worsen diabetes. He wants to split dose to 15mg  AM and 5 mg PM- that is reasonable. Recheck 6 weeks and hoping we can reduce dose at that time. He will let me know about DM as per that section

## 2017-10-27 NOTE — Progress Notes (Signed)
Subjective:  Walter [name] is a 77 y.o. year old very pleasant male patient who presents for/with See problem oriented charting ROS- some stiffness and pain in shoulders and hips particualrly in am but ooverall moving arms/legs much better. No fever or chills. Has had higher cbgs   Past Medical History-  Patient Active Problem List   Diagnosis Date Noted  . Polymyalgia rheumatica (Walter [name]) 10/17/2017    Priority: High  . BPH (benign prostatic hyperplasia) 07/02/2015    Priority: High  . Diabetes mellitus type II, controlled (Walter [name]) 04/23/2007    Priority: High  . Hyperlipidemia 04/23/2007    Priority: Medium  . Obstructive sleep apnea 04/23/2007    Priority: Medium  . Essential hypertension 04/23/2007    Priority: Medium  . Toe amputation status, right (Walter [name]) 11/22/2016    Priority: Low  . Sepsis secondary to UTI (Walter [name]) 03/29/2015    Priority: Low  . Hallux valgus of right foot 03/26/2014    Priority: Low  . History of colon cancer 01/27/2014    Priority: Low  . IRITIS 12/17/2008    Priority: Low  . 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 BIGEMINY 04/21/2008    Priority: Low  . 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 amLODipine (NORVASC) 5 MG tablet TAKE 1 TABLET (5 MG TOTAL) BY MOUTH DAILY. 90 tablet 3  . aspirin 81 MG tablet Take 81 mg by mouth daily.    Marland Kitchen atorvastatin (LIPITOR) 20 MG tablet TAKE 1 TABLET ONCE A WEEK 52 tablet 0  . fluticasone (FLONASE) 50 MCG/ACT nasal spray Place 2 sprays into both nostrils daily. 16 g 5  . glimepiride (AMARYL) 4 MG tablet TAKE 2 TABLETS BY MOUTH BEFORE BREAKFAST 180 tablet 1  . glucose blood (ONE TOUCH ULTRA TEST) test strip 1 each by Other route daily. Use as instructed 100 each 3  . metFORMIN (GLUCOPHAGE) 1000 MG tablet TAKE 1 TABLET (1,000 MG TOTAL) BY MOUTH 2  (TWO) TIMES DAILY WITH A MEAL. 180 tablet 3  . Multiple Vitamins-Minerals (CENTRUM SILVER PO) Take 1 tablet by mouth daily.     . predniSONE (DELTASONE) 5 MG tablet Take 3-4 tablets (15-20 mg total) by mouth daily with breakfast. 100 tablet 2  . sitaGLIPtin (JANUVIA) 100 MG tablet Take 1 tablet (100 mg total) by mouth daily. 90 tablet 1  . telmisartan (MICARDIS) 40 MG tablet TAKE 1 TABLET (40 MG TOTAL) BY MOUTH DAILY. 90 tablet 3   No current facility-administered medications for this visit.     Objective: BP 124/78 (BP Location: Left Arm, Patient Position: Sitting, Cuff Size: Normal)   Pulse 92   Temp 98.3 F (36.8 C) (Oral)   Ht 6' (1.829 m)   Wt 201 lb 3.2 oz (91.3 kg)   SpO2 97%   BMI 27.29 kg/m  Gen: NAD, resting comfortably CV: RRR no murmurs rubs or gallops Lungs: CTAB no crackles, wheeze, rhonchi Abdomen: soft/nontender/nondistended/normal bowel sounds.  Ext: no edema Skin: warm, dry Able to stand without using hands for assist. Full ROM in shoulders  Assessment/Plan:  Polymyalgia rheumatica (HCC) S: He started on prednisone 15 mg last week - had to go up to 20 mg. He states still some stiffness and pain but not nearly like it was. Still for first 4 hours of morning feels some stiffness then feels great  in afternoo.   Td 2 days prior to when symptoms started and he wonders about possible association A/P: He is moving much better today and feels tremendously better. Still some AM stiffness and pain- I think we should leave the dose alone to not worsen diabetes. He wants to split dose to 15mg  AM and 5 mg PM- that is reasonable. Recheck 6 weeks and hoping we can reduce dose at that time. He will let me know about DM as per that section  Diabetes mellitus type II, controlled (Walter [name]) S: prior to prednisone was averaging 140-145 in AM- now 160s.  A/P: CBGs trending up- luckily have not gone up more than as above. Continue metformin 1 g BID, amaryl 8 mg, januvia. Gave some  precautions such as CBG over 250 even at random   Future Appointments  Date Time Provider Walter [name]  12/15/2017 10:45 AM [name], MD [facility]  6 week follow up  Meds ordered this encounter  Medications  . glucose blood (ONE TOUCH ULTRA TEST) test strip    Sig: 1 each by Other route daily. Use as instructed    Dispense:  100 each    Refill:  3   Return precautions advised.  [name], MD

## 2017-10-31 ENCOUNTER — Other Ambulatory Visit: Payer: Self-pay | Admitting: Family Medicine

## 2017-12-15 ENCOUNTER — Ambulatory Visit: Payer: Medicare Other | Admitting: Family Medicine

## 2017-12-15 ENCOUNTER — Encounter: Payer: Self-pay | Admitting: Family Medicine

## 2017-12-15 DIAGNOSIS — E119 Type 2 diabetes mellitus without complications: Secondary | ICD-10-CM | POA: Diagnosis not present

## 2017-12-15 DIAGNOSIS — M353 Polymyalgia rheumatica: Secondary | ICD-10-CM | POA: Diagnosis not present

## 2017-12-15 MED ORDER — PREDNISONE 1 MG PO TABS: ORAL_TABLET | ORAL | 2 refills | Status: DC

## 2017-12-15 NOTE — Assessment & Plan Note (Signed)
S: improving control despite being on prednisone on amaryl 8 mg, metformin 1 g daily, januvia CBGs-  average blood sugar is 153. After taking januvia drops to 98.  Exercise and diet- walking regularly- has lost 3 lbs. Working very hard- weight down despite reducing prednisone Lab Results  Component Value Date   HGBA1C 7.4 09/14/2017   HGBA1C 7.7 05/17/2017   HGBA1C 7.2 01/11/2017  A/P: too early for a1c- but thrilled with patient #s despite prednisone- continue current rx

## 2017-12-15 NOTE — Progress Notes (Signed)
Subjective:  Walter [name] is a 77 y.o. year old very pleasant male patient who presents for/with See problem oriented charting ROS- no fever, chills. No stiffness in arms or legs. No hypoglycemia.    Past Medical History-  Patient Active Problem List   Diagnosis Date Noted  . Polymyalgia rheumatica ([place]) 10/17/2017    Priority: High  . BPH (benign prostatic hyperplasia) 07/02/2015    Priority: High  . Diabetes mellitus type II, controlled ([place]) 04/23/2007    Priority: High  . Hyperlipidemia 04/23/2007    Priority: Medium  . Obstructive sleep apnea 04/23/2007    Priority: Medium  . Essential hypertension 04/23/2007    Priority: Medium  . Toe amputation status, right ([place]) 11/22/2016    Priority: Low  . Sepsis secondary to UTI ([place]) 03/29/2015    Priority: Low  . Hallux valgus of right foot 03/26/2014    Priority: Low  . History of colon cancer 01/27/2014    Priority: Low  . IRITIS 12/17/2008    Priority: Low  . 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 BIGEMINY 04/21/2008    Priority: Low  . 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 amLODipine (NORVASC) 5 MG tablet TAKE 1 TABLET (5 MG TOTAL) BY MOUTH DAILY. 90 tablet 3  . aspirin 81 MG tablet Take 81 mg by mouth daily.    Marland Kitchen atorvastatin (LIPITOR) 20 MG tablet TAKE 1 TABLET ONCE A WEEK 52 tablet 0  . fluticasone (FLONASE) 50 MCG/ACT nasal spray Place 2 sprays into both nostrils daily. 16 g 5  . glimepiride (AMARYL) 4 MG tablet TAKE 2 TABLETS BY MOUTH BEFORE BREAKFAST 180 tablet 1  . glucose blood (ONE TOUCH ULTRA TEST) test strip 1 each by Other route daily. Use as instructed 100 each 3  . metFORMIN (GLUCOPHAGE) 1000 MG tablet TAKE 1 TABLET (1,000 MG TOTAL) BY MOUTH 2 (TWO) TIMES DAILY WITH A MEAL. 180 tablet 3  . Multiple Vitamins-Minerals (CENTRUM  SILVER PO) Take 1 tablet by mouth daily.     . predniSONE (DELTASONE) 5 MG tablet Take 3-4 tablets (15-20 mg total) by mouth daily with breakfast. (Patient taking differently: Take 10 mg by mouth daily with breakfast. ) 100 tablet 2  . sitaGLIPtin (JANUVIA) 100 MG tablet Take 1 tablet (100 mg total) by mouth daily. 90 tablet 1  . telmisartan (MICARDIS) 40 MG tablet TAKE 1 TABLET (40 MG TOTAL) BY MOUTH DAILY. 90 tablet 3   No current facility-administered medications for this visit.     Objective: BP 120/78 (BP Location: Left Arm, Patient Position: Sitting, Cuff Size: Large)   Pulse 79   Temp 98.6 F (37 C) (Oral)   Ht 6' (1.829 m)   Wt 199 lb (90.3 kg)   SpO2 96%   BMI 26.99 kg/m  Gen: NAD, resting comfortably CV: RRR no murmurs rubs or gallops Lungs: CTAB no crackles, wheeze, rhonchi Abdomen: soft/nontender/nondistended/normal bowel sounds. Ext: no edema, great range of motion and no stiffness in shoulders or pelvic girdle Skin: warm, dry, no rash  Assessment/Plan:  Diabetes mellitus type II, controlled (HCC) S: improving control despite being on prednisone on amaryl 8 mg, metformin 1 g daily, januvia CBGs-  average blood sugar is 153. After taking januvia drops to 98.  Exercise and diet- walking regularly- has lost 3 lbs. Working very hard-  weight down despite reducing prednisone Lab Results  Component Value Date   HGBA1C 7.4 09/14/2017   HGBA1C 7.7 05/17/2017   HGBA1C 7.2 01/11/2017  A/P: too early for a1c- but thrilled with patient #s despite prednisone- continue current rx  Polymyalgia rheumatica ([place]) S:  patient started on prednisone 10/17/17 at 15mg  but had to go up to 20mg  (with 15 in AM and 5 mg in PM) by 10/27/17. Presents for follow up today.   He went down to 15 mg on the 30th of June. On 11/26/17 was feeling really good and went down to 10mg .  A/P: doing really well on 10mg  prednisone for over 2 weeks. Will begin to taper every 2-3 weeks by 1 mg with 10-15 week  follow up (see schedule per avs)  Future Appointments  Date Time Provider [place]  12/26/2017  9:00 AM [facility]  03/26/2018 10:15 AM [name], MD [facility]   Lab/Order associations: Controlled type 2 diabetes mellitus without complication, without long-term current use of insulin (HCC)  Polymyalgia rheumatica ([place])  Meds ordered this encounter  Medications  . predniSONE (DELTASONE) 1 MG tablet    Sig: Per taper regimen    Dispense:  150 tablet    Refill:  2   Return precautions advised.  [name], MD

## 2017-12-15 NOTE — Patient Instructions (Addendum)
Health Maintenance Due  Topic Date Due  . INFLUENZA VACCINE -schedule this Fall 11/30/2017   For next 2-3 weeks: take 9 mg of prednisone (5mg  tablet plus four 1 mg tablets)  Then for 2-3 weeks: take 8mg  of prednisone (5 mg tablet plus three 1 mg tablets)  Then for 2-3 weeks: take 7mg  of prednisone (5 mg tablet plus two 1 mg tablets)  Then for 2-3 weeks: take 6mg  of prednisone (5 mg tablet plus one 1 mg tablets)  Then for 2-3 weeks: take 5 mg of prednisone (5 mg tablet)   See me back at that point (10-15 weeks). We will do an a1c then.   You amaze me! Cant believe you have lost weight and sugars have improved while on predniosne. Keep up the great work. If you start getting sugars over 180 regularly let me know  Can do flu shot at the same time

## 2017-12-15 NOTE — Assessment & Plan Note (Signed)
S:  patient started on prednisone 10/17/17 at 15mg  but had to go up to 20mg  (with 15 in AM and 5 mg in PM) by 10/27/17. Presents for follow up today.   He went down to 15 mg on the 30th of June. On 11/26/17 was feeling really good and went down to 10mg .  A/P: doing really well on 10mg  prednisone for over 2 weeks. Will begin to taper every 2-3 weeks by 1 mg with 10-15 week follow up (see schedule per avs)

## 2017-12-18 LAB — HM DIABETES EYE EXAM

## 2017-12-26 ENCOUNTER — Ambulatory Visit (INDEPENDENT_AMBULATORY_CARE_PROVIDER_SITE_OTHER): Payer: Medicare Other

## 2017-12-26 VITALS — BP 152/68 | HR 74 | Ht 72.0 in | Wt 201.2 lb

## 2017-12-26 DIAGNOSIS — Z0000 Encounter for general adult medical examination without abnormal findings: Secondary | ICD-10-CM

## 2017-12-26 NOTE — Progress Notes (Signed)
PCP notes:Diabetes mellitus type II, controlled ([place]) S: improving control despite being on prednisone on amaryl 8 mg, metformin 1 g daily, januvia CBGs-  average blood sugar is 153. After taking januvia drops to 98.  Exercise and diet- walking regularly- has lost 3 lbs. Working very hard- weight down despite reducing prednisone      Lab Results  Component Value Date   HGBA1C 7.4 09/14/2017   HGBA1C 7.7 05/17/2017   HGBA1C 7.2 01/11/2017  A/P: too early for a1c- but thrilled with patient #s despite prednisone- continue current rx  Polymyalgia rheumatica ([place]) S:  patient started on prednisone 10/17/17 at 15mg  but had to go up to 20mg  (with 15 in AM and 5 mg in PM) by 10/27/17. Presents for follow up today.   He went down to 15 mg on the 30th of June. On 11/26/17 was feeling really good and went down to 10mg .  A/P: doing really well on 10mg  prednisone for over 2 weeks. Will begin to taper every 2-3 weeks by 1 mg with 10-15 week follow up (see schedule per avs)    Health maintenance:Flu Shot-schedule this Fall   Abnormal screenings: BP 152/68. Has not taken meds this morning other than Prednisone. Monitors blood pressure at home and states has been in 120/70 range. Takes meds about an hour after prednisone. Instructed to call if he notices blood pressure trending up. Verbalized understanding   Patient concerns: None   Nurse concerns:None   Next PCP appt: 03/26/18

## 2017-12-26 NOTE — Progress Notes (Signed)
I have reviewed and agree with note, evaluation, plan. I spoke with Roselyn Reef about BP today- is slightly elevated but home #s look so good. Reasonable to keep 03/26/18 follow up unless worsening BPs at home.   [name], MD

## 2017-12-26 NOTE — Patient Instructions (Signed)
Mr. Elmquist , Thank you for taking time to come for your Medicare Wellness Visit. I appreciate your ongoing commitment to your health goals. Please review the following plan we discussed and let me know if I can assist you in the future.   These are the goals we discussed: Goals    . patient     Get A1c down to 6; need to continue exercising; Portion control Fat free or low fat dairy products Fish high in omega-3 acids ( salmon, tuna, trout) Fruits, such as apples, bananas, oranges, pears, prunes Legumes, such as kidney beans, lentils, checkpeas, black-eyed peas and lima beans Vegetables; broccoli, cabbage, carrots Whole grains;   Plant fats are better; decrease "white" foods as pasta, rice, bread and desserts, sugar; Avoid red meat (limiting) palm and coconut oils; sugary foods and beverages  Two nutrients that raise blood chol levels are saturated fats and trans fat; in hydrogenated oils and fats, as stick margarine, baked goods (cookes, cakes, pies, crackers; frosting; and coffee creamers;   Some Fats lower cholesterol: Monounsaturated and polyunsaturated  Avocados Corn, sunflower, and soybean oils Nuts and seeds, such as walnuts Olive, canola, peanut, safflower, and sesame oils Peanut butter Salmon and trout Tofu          This is a list of the screening recommended for you and due dates:  Health Maintenance  Topic Date Due  . Flu Shot  11/30/2017  . Eye exam for diabetics  02/20/2018  . Hemoglobin A1C  03/17/2018  . Complete foot exam   05/17/2018  . Colon Cancer Screening  11/13/2019  . Tetanus Vaccine  09/15/2027  . Pneumonia vaccines  Completed   Preventive Care for Adults  A healthy lifestyle and preventive care can promote health and wellness. Preventive health guidelines for adults include the following key practices.  . A routine yearly physical is a good way to check with your health care provider about your health and preventive screening. It is a chance to  share any concerns and updates on your health and to receive a thorough exam.  . Visit your dentist for a routine exam and preventive care every 6 months. Brush your teeth twice a day and floss once a day. Good oral hygiene prevents tooth decay and gum disease.  . The frequency of eye exams is based on your age, health, family medical history, use  of contact lenses, and other factors. Follow your health care provider's recommendations for frequency of eye exams.  . Eat a healthy diet. Foods like vegetables, fruits, whole grains, low-fat dairy products, and lean protein foods contain the nutrients you need without too many calories. Decrease your intake of foods high in solid fats, added sugars, and salt. Eat the right amount of calories for you. Get information about a proper diet from your health care provider, if necessary.  . Regular physical exercise is one of the most important things you can do for your health. Most adults should get at least 150 minutes of moderate-intensity exercise (any activity that increases your heart rate and causes you to sweat) each week. In addition, most adults need muscle-strengthening exercises on 2 or more days a week.  Silver Sneakers may be a benefit available to you. To determine eligibility, you may visit the website: www.silversneakers.com or contact program at 712-236-7666 Mon-Fri between 8AM-8PM.   . Maintain a healthy weight. The body mass index (BMI) is a screening tool to identify possible weight problems. It provides an estimate of body fat  based on height and weight. Your health care provider can find your BMI and can help you achieve or maintain a healthy weight.   For adults 20 years and older: ? A BMI below 18.5 is considered underweight. ? A BMI of 18.5 to 24.9 is normal. ? A BMI of 25 to 29.9 is considered overweight. ? A BMI of 30 and above is considered obese.   . Maintain normal blood lipids and cholesterol levels by exercising and  minimizing your intake of saturated fat. Eat a balanced diet with plenty of fruit and vegetables. Blood tests for lipids and cholesterol should begin at age 60 and be repeated every 5 years. If your lipid or cholesterol levels are high, you are over 50, or you are at high risk for heart disease, you may need your cholesterol levels checked more frequently. Ongoing high lipid and cholesterol levels should be treated with medicines if diet and exercise are not working.  . If you smoke, find out from your health care provider how to quit. If you do not use tobacco, please do not start.  . If you choose to drink alcohol, please do not consume more than 2 drinks per day. One drink is considered to be 12 ounces (355 mL) of beer, 5 ounces (148 mL) of wine, or 1.5 ounces (44 mL) of liquor.  . If you are 48-58 years old, ask your health care provider if you should take aspirin to prevent strokes.  . Use sunscreen. Apply sunscreen liberally and repeatedly throughout the day. You should seek shade when your shadow is shorter than you. Protect yourself by wearing long sleeves, pants, a wide-brimmed hat, and sunglasses year round, whenever you are outdoors.  . Once a month, do a whole body skin exam, using a mirror to look at the skin on your back. Tell your health care provider of new moles, moles that have irregular borders, moles that are larger than a pencil eraser, or moles that have changed in shape or color.

## 2017-12-26 NOTE — Progress Notes (Signed)
Subjective:   Walter [name] is a 77 y.o. male who presents for Medicare Annual/Subsequent preventive examination.  Review of Systems:  No ROS.  Medicare Wellness Visit. Additional risk factors are reflected in the social history. Cardiac Risk Factors include: advanced age (>40men, >14 women);male gender;diabetes mellitus  Patient lives in a single story home with wife Mickel Baas of 62 years. They have a 2 story home. Enjoys tying flies (fishing), reloading (Clinical cytogeneticist), reading, watching tv, working in the yard.  Patient goes to bed between 10-11. Mostly sleeps through the night, occasionally gets up to go to the bathroom. Gets up between 6-6:30 in the morning. Uses a CPAP every night.    Objective:    Vitals: BP (!) 152/68 (BP Location: Left Arm, Patient Position: Sitting, Cuff Size: Large)   Pulse 74   Ht 6' (1.829 m)   Wt 201 lb 3.2 oz (91.3 kg)   SpO2 96%   BMI 27.29 kg/m   Body mass index is 27.29 kg/m.  Advanced Directives 12/26/2017 09/14/2016 09/13/2016 02/19/2016 02/19/2016 06/20/2015 06/19/2015  Does Patient Have a Medical Advance Directive? Yes Yes Yes Yes Yes No No  Type of Paramedic of [place];Living will - Living will - - - -  Does patient want to make changes to medical advance directive? No - Patient declined - - - - - -  Copy of [place] in Chart? No - copy requested - - No - copy requested - - -  Would patient like information on creating a medical advance directive? - - - - - No - patient declined information -    Tobacco Social History   Tobacco Use  Smoking Status Never Smoker  Smokeless Tobacco Never Used  Tobacco Comment   smoked for less that 6 months in 1960     Counseling given: Not Answered Comment: smoked for less that 6 months in 1960       Past Medical History:  Diagnosis Date  . Colon cancer ([place]) 1992   colon resection 1992  . Complication of anesthesia    bladder problems after surgery  from Anesthesia- can not urinate  . Diabetes mellitus    type 2  . History of shingles   . Hyperlipidemia   . Hypertension   . Osteomyelitis ([place])    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  . Sleep apnea    uses CPAP nightly   Past Surgical History:  Procedure Laterality Date  . AMPUTATION TOE Right 09/14/2016   Procedure: RIGHT 2ND TOE/RAY AMPUTATION;  Surgeon: Leandrew Koyanagi, MD;  Location: [facility];  Service: Orthopedics;  Laterality: Right;  . APPENDECTOMY  1949  . COLECTOMY  1992  . CYSTOSCOPY N/A 06/21/2015   Procedure: CYSTOSCOPY, CLOT EVACUATION, AND CAUTERIZATION OF PROSTATE FOSSA;  Surgeon: Carolan Clines, MD;  Location: [facility];  Service: Urology;  Laterality: N/A;  . HEMORRHOID SURGERY    . TRANSURETHRAL RESECTION OF PROSTATE N/A 05/23/2015   Procedure: TRANSURETHRAL RESECTION OF THE PROSTATE (TURP);  Surgeon: Irine Seal, MD;  Location: [facility];  Service: Urology;  Laterality: N/A;   Family History  Problem Relation Age of Onset  . Colon cancer Father 39  . Diabetes Father        type I  . Alzheimer's disease Mother   . Cancer Unknown        colon/fhx  . Rectal cancer Maternal Uncle   . Colon cancer Paternal Grandmother 55   Social History   Socioeconomic  History  . Marital status: Married    Spouse name: Not on file  . Number of children: Not on file  . Years of education: Not on file  . Highest education level: Not on file  Occupational History  . Not on file  Social Needs  . Financial resource strain: Not on file  . Food insecurity:    Worry: Not on file    Inability: Not on file  . Transportation needs:    Medical: Not on file    Non-medical: Not on file  Tobacco Use  . Smoking status: Never Smoker  . Smokeless tobacco: Never Used  . Tobacco comment: smoked for less that 6 months in 1960  Substance and Sexual Activity  . Alcohol use: Yes    Alcohol/week: 2.0 standard drinks    Types: 2 Cans of beer per week    Comment: social  . Drug use: No  .  Sexual activity: Not on file  Lifestyle  . Physical activity:    Days per week: Not on file    Minutes per session: Not on file  . Stress: Not on file  Relationships  . Social connections:    Talks on phone: Not on file    Gets together: Not on file    Attends religious service: Not on file    Active member of club or organization: Not on file    Attends meetings of clubs or organizations: Not on file    Relationship status: Not on file  Other Topics Concern  . Not on file  Social History Narrative   Married 1966. 2 daughters Sharee Pimple divorced lives in [place] works for [employer] no kids and Mateo Flow never married in [place], [place] working for [employer] for [employer] improvement no kids.       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)      No religious beliefs affecting health care, no afterlife beliefs    Outpatient Encounter Medications as of 12/26/2017  Medication Sig  . amLODipine (NORVASC) 5 MG tablet TAKE 1 TABLET (5 MG TOTAL) BY MOUTH DAILY.  Marland Kitchen aspirin 81 MG tablet Take 81 mg by mouth daily.  Marland Kitchen atorvastatin (LIPITOR) 20 MG tablet TAKE 1 TABLET ONCE A WEEK  . fluticasone (FLONASE) 50 MCG/ACT nasal spray Place 2 sprays into both nostrils daily.  Marland Kitchen glimepiride (AMARYL) 4 MG tablet TAKE 2 TABLETS BY MOUTH BEFORE BREAKFAST  . glucose blood (ONE TOUCH ULTRA TEST) test strip 1 each by Other route daily. Use as instructed  . metFORMIN (GLUCOPHAGE) 1000 MG tablet TAKE 1 TABLET (1,000 MG TOTAL) BY MOUTH 2 (TWO) TIMES DAILY WITH A MEAL.  . Multiple Vitamins-Minerals (CENTRUM SILVER PO) Take 1 tablet by mouth daily.   . predniSONE (DELTASONE) 1 MG tablet Per taper regimen  . predniSONE (DELTASONE) 5 MG tablet Take 3-4 tablets (15-20 mg total) by mouth daily with breakfast. (Patient taking differently: Take 10 mg by mouth daily with breakfast. )  . sitaGLIPtin (JANUVIA) 100 MG tablet Take 1 tablet (100 mg total) by mouth  daily.  Marland Kitchen telmisartan (MICARDIS) 40 MG tablet TAKE 1 TABLET (40 MG TOTAL) BY MOUTH DAILY.   No facility-administered encounter medications on file as of 12/26/2017.     Activities of Daily Living In your present state of health, do you have any difficulty performing the following activities: 12/26/2017 10/17/2017  Hearing? N N  Vision? N N  Difficulty concentrating or  making decisions? N N  Walking or climbing stairs? N Y  Dressing or bathing? N Y  Doing errands, shopping? N N  Preparing Food and eating ? N -  Using the Toilet? N -  In the past six months, have you accidently leaked urine? N -  Do you have problems with loss of bowel control? N -  Managing your Medications? N -  Managing your Finances? N -  Housekeeping or managing your Housekeeping? N -  Some recent data might be hidden    Patient Care Team: [name], MD as PCP - General (Family Medicine) Michael Boston, MD as Consulting Physician (General Surgery) Ladene Artist, MD as Consulting Physician (Gastroenterology) Irine Seal, MD as Attending Physician (Urology)   Assessment:   This is a routine wellness examination for [place].  Exercise Activities and Dietary recommendations Current Exercise Habits: Home exercise routine, Type of exercise: walking(3 miles every day), Time (Minutes): 45, Frequency (Times/Week): 7, Weekly Exercise (Minutes/Week): 315, Intensity: Mild  Breakfast: Oatmeal with a glass of milk and a glass of water  Lunch: Salad or a sandwich with water to drink  Dinner: Astronomer with a club soda to drink Goals    . patient     Get A1c down to 6; need to continue exercising; Portion control Fat free or low fat dairy products Fish high in omega-3 acids ( salmon, tuna, trout) Fruits, such as apples, bananas, oranges, pears, prunes Legumes, such as kidney beans, lentils, checkpeas, black-eyed peas and lima beans Vegetables; broccoli, cabbage, carrots Whole grains;   Plant fats are  better; decrease "white" foods as pasta, rice, bread and desserts, sugar; Avoid red meat (limiting) palm and coconut oils; sugary foods and beverages  Two nutrients that raise blood chol levels are saturated fats and trans fat; in hydrogenated oils and fats, as stick margarine, baked goods (cookes, cakes, pies, crackers; frosting; and coffee creamers;   Some Fats lower cholesterol: Monounsaturated and polyunsaturated  Avocados Corn, sunflower, and soybean oils Nuts and seeds, such as walnuts Olive, canola, peanut, safflower, and sesame oils Peanut butter Salmon and trout Tofu          Fall Risk Fall Risk  12/26/2017 05/17/2017 03/08/2017 02/19/2016 07/02/2015  Falls in the past year? No No No No No  Number falls in past yr: - - - - -  Injury with Fall? - - - - -  Risk for fall due to : - - - - -    Depression Screen PHQ 2/9 Scores 12/26/2017 05/17/2017 03/08/2017 02/19/2016  PHQ - 2 Score 0 0 0 0    Cognitive Function MMSE - Mini Mental State Exam 02/19/2016  Not completed: (No Data)     6CIT Screen 12/26/2017  What Year? 0 points  What month? 0 points  What time? 0 points  Count back from 20 0 points  Months in reverse 0 points    Immunization History  Administered Date(s) Administered  . Influenza Split 01/31/2011, 01/16/2012  . Influenza Whole 03/07/2007, 01/31/2008, 01/30/2009  . Influenza, High Dose Seasonal PF 01/11/2017  . Influenza,inj,Quad PF,6+ Mos 01/21/2013, 01/27/2014, 12/26/2014  . Influenza-Unspecified 01/05/2016  . Pneumococcal Conjugate-13 08/19/2013  . Pneumococcal Polysaccharide-23 04/04/2007  . Td 04/04/2007, 09/14/2017  . Varicella 08/19/2013      Screening Tests Health Maintenance  Topic Date Due  . INFLUENZA VACCINE  11/30/2017  . OPHTHALMOLOGY EXAM  02/20/2018  . HEMOGLOBIN A1C  03/17/2018  . FOOT EXAM  05/17/2018  .  COLONOSCOPY  11/13/2019  . TETANUS/TDAP  09/15/2027  . PNA vac Low Risk Adult  Completed       Plan:    Follow Up with  PCP as Needed  I have personally reviewed and noted the following in the patient's chart:   . Medical and social history . Use of alcohol, tobacco or illicit drugs  . Current medications and supplements . Functional ability and status . Nutritional status . Physical activity . Advanced directives . List of other physicians . Vitals . Screenings to include cognitive, depression, and falls . Referrals and appointments  In addition, I have reviewed and discussed with patient certain preventive protocols, quality metrics, and best practice recommendations. A written personalized care plan for preventive services as well as general preventive health recommendations were provided to patient.     [place], [place]  [id number]

## 2018-01-01 ENCOUNTER — Other Ambulatory Visit: Payer: Self-pay | Admitting: Family Medicine

## 2018-01-10 ENCOUNTER — Other Ambulatory Visit: Payer: Self-pay | Admitting: Family Medicine

## 2018-01-15 ENCOUNTER — Encounter: Payer: Medicare Other | Admitting: Family Medicine

## 2018-02-05 ENCOUNTER — Ambulatory Visit: Payer: Medicare Other

## 2018-02-05 ENCOUNTER — Ambulatory Visit (INDEPENDENT_AMBULATORY_CARE_PROVIDER_SITE_OTHER): Payer: Medicare Other

## 2018-02-05 DIAGNOSIS — Z23 Encounter for immunization: Secondary | ICD-10-CM

## 2018-03-06 ENCOUNTER — Other Ambulatory Visit: Payer: Self-pay | Admitting: Family Medicine

## 2018-03-13 ENCOUNTER — Other Ambulatory Visit: Payer: Self-pay

## 2018-03-13 MED ORDER — METFORMIN HCL 1000 MG PO TABS: ORAL_TABLET | ORAL | 3 refills | Status: DC

## 2018-03-26 ENCOUNTER — Ambulatory Visit: Payer: Medicare Other | Admitting: Family Medicine

## 2018-03-26 ENCOUNTER — Encounter: Payer: Self-pay | Admitting: Family Medicine

## 2018-03-26 VITALS — BP 132/76 | HR 77 | Temp 97.5°F | Ht 72.0 in | Wt 201.6 lb

## 2018-03-26 DIAGNOSIS — E785 Hyperlipidemia, unspecified: Secondary | ICD-10-CM | POA: Diagnosis not present

## 2018-03-26 DIAGNOSIS — Z6827 Body mass index (BMI) 27.0-27.9, adult: Secondary | ICD-10-CM

## 2018-03-26 DIAGNOSIS — M353 Polymyalgia rheumatica: Secondary | ICD-10-CM

## 2018-03-26 DIAGNOSIS — I10 Essential (primary) hypertension: Secondary | ICD-10-CM

## 2018-03-26 DIAGNOSIS — E119 Type 2 diabetes mellitus without complications: Secondary | ICD-10-CM

## 2018-03-26 LAB — COMPREHENSIVE METABOLIC PANEL
ALT: 24 U/L (ref 0–53)
AST: 16 U/L (ref 0–37)
Albumin: 4.5 g/dL (ref 3.5–5.2)
Alkaline Phosphatase: 67 U/L (ref 39–117)
BUN: 16 mg/dL (ref 6–23)
CALCIUM: 9.9 mg/dL (ref 8.4–10.5)
CHLORIDE: 102 meq/L (ref 96–112)
CO2: 29 meq/L (ref 19–32)
Creatinine, Ser: 0.93 mg/dL (ref 0.40–1.50)
GFR: 83.66 mL/min (ref >60.00)
GLUCOSE: 188 mg/dL — AB (ref 70–99)
Potassium: 4.9 mEq/L (ref 3.5–5.1)
Sodium: 140 mEq/L (ref 135–145)
Total Bilirubin: 0.5 mg/dL (ref 0.2–1.2)
Total Protein: 7.3 g/dL (ref 6.0–8.3)

## 2018-03-26 LAB — LIPID PANEL
Cholesterol: 156 mg/dL (ref 0–200)
HDL: 41.2 mg/dL (ref >39.00)
LDL Cholesterol: 91 mg/dL (ref 0–99)
NONHDL: 115.13
TRIGLYCERIDES: 123 mg/dL (ref 0.0–149.0)
Total CHOL/HDL Ratio: 4
VLDL: 24.6 mg/dL (ref 0.0–40.0)

## 2018-03-26 LAB — CBC WITH DIFFERENTIAL/PLATELET
BASOS ABS: 0 10*3/uL (ref 0.0–0.1)
Basophils Relative: 0.3 % (ref 0.0–3.0)
Eosinophils Absolute: 0 10*3/uL (ref 0.0–0.7)
Eosinophils Relative: 0.4 % (ref 0.0–5.0)
HCT: 44.4 % (ref 39.0–52.0)
Hemoglobin: 14.9 g/dL (ref 13.0–17.0)
LYMPHS ABS: 0.9 10*3/uL (ref 0.7–4.0)
LYMPHS PCT: 9.1 % — AB (ref 12.0–46.0)
MCHC: 33.4 g/dL (ref 30.0–36.0)
MCV: 91.3 fl (ref 78.0–100.0)
Monocytes Absolute: 0.6 10*3/uL (ref 0.1–1.0)
Monocytes Relative: 6 % (ref 3.0–12.0)
NEUTROS PCT: 84.2 % — AB (ref 43.0–77.0)
Neutro Abs: 8.3 10*3/uL — ABNORMAL HIGH (ref 1.4–7.7)
Platelets: 218 10*3/uL (ref 150.0–400.0)
RBC: 4.87 Mil/uL (ref 4.22–5.81)
RDW: 14.3 % (ref 11.5–15.5)
WBC: 9.9 10*3/uL (ref 4.0–10.5)

## 2018-03-26 LAB — POCT GLYCOSYLATED HEMOGLOBIN (HGB A1C): Hemoglobin A1C: 7.3 % — AB (ref 4.0–5.6)

## 2018-03-26 NOTE — Assessment & Plan Note (Signed)
S: Mild poorly controlled on Amaryl 8 mg, metformin 1 g twice daily, Januvia.  Actually this is pretty impressive with him needing to be on prednisone. Lab Results  Component Value Date   HGBA1C 7.3 (A) 03/26/2018   HGBA1C 7.4 09/14/2017   HGBA1C 7.7 05/17/2017   A/P: stable - doing reasonably well even with prednisone- hoping he will be off next visit and for a1c under 7.  - wants to switch back to the one touch ultra instead of verio- Jamie to send in strips- he is going [facility] meter [facility]

## 2018-03-26 NOTE — Patient Instructions (Addendum)
Health Maintenance Due  Topic Date Due  . OPHTHALMOLOGY EXAM - called [facility] and requested that they fax over report 02/20/2018  . HEMOGLOBIN A1C -completed today. 7.3 today. Looks great considering predniosne  03/17/2018   For next 2-3 weeks: take 3 mg of prednisone   Then for 2-3 weeks: take 2mg  of prednisone   Then for 2-3 weeks: take 1mg  of prednisone   Can always take one step back up if needed- if get into trouble with coming off reach out to me and we can vary your regimen together.   No other changes today  Please stop by lab before you go

## 2018-03-26 NOTE — Assessment & Plan Note (Signed)
S: controlled on amlodipine 5 mg and Micardis 40 mg BP Readings from Last 3 Encounters:  03/26/18 132/76  12/26/17 (!) 152/68  12/15/17 120/78  A/P: We discussed blood pressure goal of <140/90. Continue current meds

## 2018-03-26 NOTE — Assessment & Plan Note (Signed)
S:  controlled on atorvastatin 20 mg once a week in the past Lab Results  Component Value Date   CHOL 143 01/11/2017   HDL 47.70 01/11/2017   LDLCALC 67 01/11/2017   LDLDIRECT 90.0 09/14/2017   TRIG 142.0 01/11/2017   CHOLHDL 3 01/11/2017   A/P: Stable and past -need update lipid panel though to verify current control

## 2018-03-26 NOTE — Progress Notes (Signed)
Subjective:  Walter [name] is a 77 y.o. year old very pleasant male patient who presents for/with See problem oriented charting ROS- No chest pain or shortness of breath. No headache or blurry vision.  No low blood sugars.  Past Medical History-  Patient Active Problem List   Diagnosis Date Noted  . 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 BPH (benign prostatic hyperplasia) 07/02/2015    Priority: High  . Diabetes mellitus type II, controlled ([place]) 04/23/2007    Priority: High  . Hyperlipidemia 04/23/2007    Priority: Medium  . Obstructive sleep apnea 04/23/2007    Priority: Medium  . Essential hypertension 04/23/2007    Priority: Medium  . 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 Sepsis secondary to UTI ([place]) 03/29/2015    Priority: Low  . Hallux valgus of right foot 03/26/2014    Priority: Low  . History of colon cancer 01/27/2014    Priority: Low  . IRITIS 12/17/2008    Priority: Low  . 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 BIGEMINY 04/21/2008    Priority: Low  . 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 amLODipine (NORVASC) 5 MG tablet TAKE 1 TABLET (5 MG TOTAL) BY MOUTH DAILY. 90 tablet 3  . aspirin 81 MG tablet Take 81 mg by mouth daily.    Marland Kitchen atorvastatin (LIPITOR) 20 MG tablet TAKE 1 TABLET ONCE A WEEK 52 tablet 0  . glimepiride (AMARYL) 4 MG tablet TAKE 2 TABLETS BY MOUTH BEFORE BREAKFAST 180 tablet 1  . glucose blood (ONE TOUCH ULTRA TEST) test strip 1 each by Other route daily. Use as instructed 100 each 3  . JANUVIA 100 MG tablet TAKE 1 TABLET BY MOUTH EVERY DAY 90 tablet 1  . metFORMIN (GLUCOPHAGE) 1000 MG tablet TAKE 1 TABLET (1,000 MG TOTAL) BY MOUTH 2 (TWO) TIMES DAILY WITH A MEAL. 180 tablet 3  . Multiple Vitamins-Minerals (CENTRUM SILVER PO)  Take 1 tablet by mouth daily.     . predniSONE (DELTASONE) 1 MG tablet PER TAPER REGIMEN 150 tablet 2  . telmisartan (MICARDIS) 40 MG tablet TAKE 1 TABLET (40 MG TOTAL) BY MOUTH DAILY. 90 tablet 3   No current facility-administered medications for this visit.     Objective: BP 132/76 (BP Location: Left Arm, Patient Position: Sitting, Cuff Size: Large)   Pulse 77   Temp (!) 97.5 F (36.4 C) (Oral)   Ht 6' (1.829 m)   Wt 201 lb 9.6 oz (91.4 kg)   SpO2 96%   BMI 27.34 kg/m  Gen: NAD, resting comfortably CV: RRR no murmurs rubs or gallops Lungs: CTAB no crackles, wheeze, rhonchi Abdomen: soft/nontender/nondistended/normal bowel sounds.  Ext: no edema Skin: warm, dry Neuro: grossly normal, moves all extremities  Assessment/Plan:   Diabetes mellitus type II, controlled (HCC) S: Mild poorly controlled on Amaryl 8 mg, metformin 1 g twice daily, Januvia.  Actually this is pretty impressive with him needing to be on prednisone. Lab Results  Component Value Date   HGBA1C 7.3 (A) 03/26/2018   HGBA1C 7.4 09/14/2017   HGBA1C 7.7 05/17/2017   A/P: stable - doing reasonably well even with prednisone- hoping he will be off next visit and for a1c under 7.  - wants to switch back to the one touch ultra  instead of verio- Roselyn Reef to send in strips- [facility]  Polymyalgia rheumatica ([place]) S: Patient was started on prednisone 10/17/2017 at 15 mg but later had to go up to 20 mg.  Last visit he was down to 10 mg.  We discussed the taper with hopes of getting him to 5 mg by this visit. Occasionally feels mild stiffness in hips- but wonders if that is the PMR or old age.  A/P: doing very well- continue current taper  Essential hypertension S: controlled on amlodipine 5 mg and Micardis 40 mg BP Readings from Last 3 Encounters:  03/26/18 132/76  12/26/17 (!) 152/68  12/15/17 120/78  A/P: We discussed blood pressure goal of <140/90. Continue current  meds  Hyperlipidemia S:  controlled on atorvastatin 20 mg once a week in the past Lab Results  Component Value Date   CHOL 143 01/11/2017   HDL 47.70 01/11/2017   LDLCALC 67 01/11/2017   LDLDIRECT 90.0 09/14/2017   TRIG 142.0 01/11/2017   CHOLHDL 3 01/11/2017   A/P: Stable and past -need update lipid panel though to verify current control   Future Appointments  Date Time Provider [place]  01/22/2019 11:00 AM [facility]   Return in about 4 months (around 07/25/2018) for follow up- or sooner if needed particularly if PMR recurs .  Lab/Order associations: oatmeal this AM- not fasting Controlled type 2 diabetes mellitus without complication, without long-term current use of insulin (HCC) - Plan: POCT glycosylated hemoglobin (Hb A1C), CBC with Differential/Platelet, Lipid panel, Comprehensive metabolic panel  Polymyalgia rheumatica (HCC)  Essential hypertension  Hyperlipidemia, unspecified hyperlipidemia type  Return precautions advised.  [name], MD

## 2018-03-26 NOTE — Assessment & Plan Note (Signed)
S: Patient was started on prednisone 10/17/2017 at 15 mg but later had to go up to 20 mg.  Last visit he was down to 10 mg.  We discussed the taper with hopes of getting him to 5 mg by this visit. Occasionally feels mild stiffness in hips- but wonders if that is the PMR or old age.  A/P: doing very well- continue current taper

## 2018-04-08 ENCOUNTER — Other Ambulatory Visit: Payer: Self-pay | Admitting: Family Medicine

## 2018-04-12 ENCOUNTER — Other Ambulatory Visit: Payer: Self-pay | Admitting: Family Medicine

## 2018-06-28 ENCOUNTER — Other Ambulatory Visit: Payer: Self-pay | Admitting: Family Medicine

## 2018-07-22 ENCOUNTER — Encounter: Payer: Self-pay | Admitting: Family Medicine

## 2018-07-23 ENCOUNTER — Encounter: Payer: Self-pay | Admitting: Family Medicine

## 2018-07-23 NOTE — Telephone Encounter (Signed)
Please see message . Thank you .

## 2018-07-24 ENCOUNTER — Encounter: Payer: Self-pay | Admitting: Family Medicine

## 2018-07-24 ENCOUNTER — Ambulatory Visit (INDEPENDENT_AMBULATORY_CARE_PROVIDER_SITE_OTHER): Payer: Medicare Other | Admitting: Family Medicine

## 2018-07-24 VITALS — BP 120/80 | Wt 199.0 lb

## 2018-07-24 DIAGNOSIS — M353 Polymyalgia rheumatica: Secondary | ICD-10-CM

## 2018-07-24 DIAGNOSIS — I152 Hypertension secondary to endocrine disorders: Secondary | ICD-10-CM

## 2018-07-24 DIAGNOSIS — E119 Type 2 diabetes mellitus without complications: Secondary | ICD-10-CM

## 2018-07-24 DIAGNOSIS — I10 Essential (primary) hypertension: Secondary | ICD-10-CM

## 2018-07-24 DIAGNOSIS — E1159 Type 2 diabetes mellitus with other circulatory complications: Secondary | ICD-10-CM

## 2018-07-24 DIAGNOSIS — E785 Hyperlipidemia, unspecified: Secondary | ICD-10-CM

## 2018-07-24 DIAGNOSIS — E1169 Type 2 diabetes mellitus with other specified complication: Secondary | ICD-10-CM

## 2018-07-24 NOTE — Progress Notes (Signed)
Phone [phone]   Subjective:  Virtual visit via Video note  Our team/I connected with Laurel Dimmer on 07/24/18 at  3:00 PM EDT by a video enabled telemedicine application (webex- he could see me but his video produced red screen) and verified that I am speaking with the correct person using two identifiers.  Location patient: Home- o2 Location provider: [facility], office Persons participating in the virtual visit: patient, wife  Our team/I discussed the limitations of evaluation and management by telemedicine and the availability of in person appointments. In light of current covid-19 pandemic, patient also understands that we are trying to protect them by minimizing in office contact if at all possible.  The patient expressed consent for telemedicine visit and agreed to proceed.   ROS- No chest pain or shortness of breath. No headache or blurry vision. No fever, chills, cough.    Past Medical History-  Patient Active Problem List   Diagnosis Date Noted  . 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 BPH (benign prostatic hyperplasia) 07/02/2015    Priority: High  . Diabetes mellitus type II, controlled ([place]) 04/23/2007    Priority: High  . Hyperlipidemia associated with type 2 diabetes mellitus ([place]) 04/23/2007    Priority: Medium  . Obstructive sleep apnea 04/23/2007    Priority: Medium  . Hypertension associated with diabetes ([place]) 04/23/2007    Priority: Medium  . 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 Sepsis secondary to UTI ([place]) 03/29/2015    Priority: Low  . Hallux valgus of right foot 03/26/2014    Priority: Low  . History of colon cancer 01/27/2014    Priority: Low  . IRITIS 12/17/2008    Priority: Low  . 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 BIGEMINY 04/21/2008    Priority: Low  . 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 amLODipine (NORVASC) 5 MG tablet TAKE 1 TABLET BY MOUTH EVERY DAY 90 tablet 3  . aspirin 81 MG tablet Take 81 mg by mouth daily.    Marland Kitchen atorvastatin (LIPITOR) 20 MG tablet TAKE 1 TABLET ONCE A WEEK 52 tablet 0  . glimepiride (AMARYL) 4 MG tablet TAKE 2 TABLETS BY MOUTH BEFORE BREAKFAST 180 tablet 1  . glucose blood (ONE TOUCH ULTRA TEST) test strip 1 each by Other route daily. Use as instructed 100 each 3  . JANUVIA 100 MG tablet TAKE 1 TABLET BY MOUTH EVERY DAY 90 tablet 1  . metFORMIN (GLUCOPHAGE) 1000 MG tablet TAKE 1 TABLET (1,000 MG TOTAL) BY MOUTH 2 (TWO) TIMES DAILY WITH A MEAL. 180 tablet 3  . Multiple Vitamins-Minerals (CENTRUM SILVER PO) Take 1 tablet by mouth daily.     . predniSONE (DELTASONE) 1 MG tablet PER TAPER REGIMEN 150 tablet 2  . telmisartan (MICARDIS) 40 MG tablet TAKE 1 TABLET BY MOUTH EVERY DAY 90 tablet 3   No current facility-administered medications for this visit.      Objective:  BP 120/80 [facility]  Wt 199 lb (90.3 kg)   BMI 26.99 kg/m  Gen: NAD heard by voice    Assessment and Plan   # Diabetes S: At last visit control was improving with lifestyle modifications as well as Amaryl 8 mg, Januvia 100 mg, metformin 1000 mg twice a day.  Some of this likely counteracted by prednisone use. CBGs- he reports his average blood sugar readings in the 140s. Down to 132 this am Exercise and diet- was out in [place] and walked everyday at least 3-4 miles and walking since getting home Lab Results  Component Value Date   HGBA1C 7.3 (A) 03/26/2018   HGBA1C 7.4 09/14/2017   HGBA1C 7.7 05/17/2017   A/P: Stable. Continue current medications.    #hyperlipidemia S: well controlled on atorvastatin 20 mg once a week Lab Results  Component Value Date   CHOL 156 03/26/2018   HDL 41.20 03/26/2018   LDLCALC 91 03/26/2018   LDLDIRECT 90.0 09/14/2017   TRIG 123.0 03/26/2018    CHOLHDL 4 03/26/2018   A/P: Stable. Continue current medications.    #hypertension S: controlled on amlodipine 5 mg, telmisartan 40mg  BP Readings from Last 3 Encounters:  07/24/18 120/80  03/26/18 132/76  12/26/17 (!) 152/68  A/P:   Stable. Continue current medications.     #Polymyalgia rheumatica S:remains well controlled for most part - still able to walk. He has actually titrated down to 1 mg of prednisone every 4 days for 3 weeks.  A/P: Doing well-advised him to go to 1 mg of prednisone once a week after 1 more week and then stop completely if doing well after 4 more weeks.   Future Appointments  Date Time Provider [place]  01/22/2019 11:00 AM [facility]   Return in about 3 months (around 10/24/2018) for follow up- or sooner if needed.  Lab/Order associations: Controlled type 2 diabetes mellitus without complication, without long-term current use of insulin (HCC) - Plan: CBC, Comprehensive metabolic panel, Hemoglobin A1c  Hyperlipidemia associated with type 2 diabetes mellitus ([place])  Hypertension associated with diabetes ([place])  Polymyalgia rheumatica ([place])  Return precautions advised.  [name], MD

## 2018-07-24 NOTE — Patient Instructions (Addendum)
Health Maintenance Due  Topic Date Due  . FOOT EXAM-we will need to do at future in person visit 05/17/2018   Doing well-after 1 more week- advised him to go to 1 mg of prednisone once a week for a month and then stop completely if doing well after 4 more weeks.  He will plan on 3 month follow up- hopefully in person  Will need to get labs in 3 months likely regardless

## 2018-07-29 ENCOUNTER — Encounter: Payer: Self-pay | Admitting: Family Medicine

## 2018-08-06 ENCOUNTER — Ambulatory Visit: Payer: Medicare Other | Admitting: Family Medicine

## 2018-09-02 ENCOUNTER — Encounter: Payer: Self-pay | Admitting: Family Medicine

## 2018-09-03 NOTE — Telephone Encounter (Signed)
Please see message . Thank you .

## 2018-10-11 ENCOUNTER — Other Ambulatory Visit: Payer: Self-pay | Admitting: Family Medicine

## 2018-10-17 ENCOUNTER — Other Ambulatory Visit: Payer: Self-pay | Admitting: Family Medicine

## 2018-10-24 ENCOUNTER — Other Ambulatory Visit (INDEPENDENT_AMBULATORY_CARE_PROVIDER_SITE_OTHER): Payer: Medicare Other

## 2018-10-24 ENCOUNTER — Other Ambulatory Visit: Payer: Self-pay

## 2018-10-24 DIAGNOSIS — E119 Type 2 diabetes mellitus without complications: Secondary | ICD-10-CM

## 2018-10-24 LAB — CBC
HCT: 41.3 % (ref 39.0–52.0)
Hemoglobin: 14.2 g/dL (ref 13.0–17.0)
MCHC: 34.3 g/dL (ref 30.0–36.0)
MCV: 92.7 fl (ref 78.0–100.0)
Platelets: 175 10*3/uL (ref 150.0–400.0)
RBC: 4.46 Mil/uL (ref 4.22–5.81)
RDW: 13.5 % (ref 11.5–15.5)
WBC: 6.5 10*3/uL (ref 4.0–10.5)

## 2018-10-24 LAB — COMPREHENSIVE METABOLIC PANEL WITH GFR
ALT: 20 U/L (ref 0–53)
AST: 18 U/L (ref 0–37)
Albumin: 4.3 g/dL (ref 3.5–5.2)
Alkaline Phosphatase: 69 U/L (ref 39–117)
BUN: 12 mg/dL (ref 6–23)
CO2: 27 meq/L (ref 19–32)
Calcium: 9.1 mg/dL (ref 8.4–10.5)
Chloride: 102 meq/L (ref 96–112)
Creatinine, Ser: 0.92 mg/dL (ref 0.40–1.50)
GFR: 79.58 mL/min
Glucose, Bld: 188 mg/dL — ABNORMAL HIGH (ref 70–99)
Potassium: 4.3 meq/L (ref 3.5–5.1)
Sodium: 138 meq/L (ref 135–145)
Total Bilirubin: 0.7 mg/dL (ref 0.2–1.2)
Total Protein: 6.7 g/dL (ref 6.0–8.3)

## 2018-10-24 LAB — HEMOGLOBIN A1C: Hgb A1c MFr Bld: 7.2 % — ABNORMAL HIGH (ref 4.6–6.5)

## 2018-10-29 ENCOUNTER — Other Ambulatory Visit: Payer: Self-pay

## 2018-10-29 ENCOUNTER — Encounter: Payer: Self-pay | Admitting: Family Medicine

## 2018-10-29 ENCOUNTER — Ambulatory Visit: Payer: Medicare Other | Admitting: Family Medicine

## 2018-10-29 VITALS — BP 139/79 | HR 71 | Temp 97.9°F | Ht 73.0 in | Wt 206.0 lb

## 2018-10-29 DIAGNOSIS — E785 Hyperlipidemia, unspecified: Secondary | ICD-10-CM

## 2018-10-29 DIAGNOSIS — E119 Type 2 diabetes mellitus without complications: Secondary | ICD-10-CM

## 2018-10-29 DIAGNOSIS — E1169 Type 2 diabetes mellitus with other specified complication: Secondary | ICD-10-CM | POA: Diagnosis not present

## 2018-10-29 DIAGNOSIS — I152 Hypertension secondary to endocrine disorders: Secondary | ICD-10-CM

## 2018-10-29 DIAGNOSIS — E1159 Type 2 diabetes mellitus with other circulatory complications: Secondary | ICD-10-CM

## 2018-10-29 DIAGNOSIS — E663 Overweight: Secondary | ICD-10-CM

## 2018-10-29 DIAGNOSIS — I10 Essential (primary) hypertension: Secondary | ICD-10-CM

## 2018-10-29 DIAGNOSIS — Z89421 Acquired absence of other right toe(s): Secondary | ICD-10-CM | POA: Diagnosis not present

## 2018-10-29 NOTE — Patient Instructions (Addendum)
Great to see you in person! Glad you are doing well!   No changes today- a1c a hair high- want under 7 but you have been on prednisone and now off so hoping this improves along with continuing exercise and trimming that snacking down!

## 2018-10-29 NOTE — Progress Notes (Addendum)
Phone [phone]   Subjective:  Walter [name] is a 78 y.o. year old very pleasant male patient who presents for/with See problem oriented charting Chief Complaint  Patient presents with  . Follow-up    prednisone    ROS-  No chest pain or shortness of breath. No headache or blurry vision.   Past Medical History-  Patient Active Problem List   Diagnosis Date Noted  . Polymyalgia rheumatica ([place]) 10/17/2017    Priority: High  . BPH (benign prostatic hyperplasia) 07/02/2015    Priority: High  . Diabetes mellitus type II, controlled ([place]) 04/23/2007    Priority: High  . Hyperlipidemia associated with type 2 diabetes mellitus ([place]) 04/23/2007    Priority: Medium  . Obstructive sleep apnea 04/23/2007    Priority: Medium  . Hypertension associated with diabetes ([place]) 04/23/2007    Priority: Medium  . 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 Sepsis secondary to UTI ([place]) 03/29/2015    Priority: Low  . Hallux valgus of right foot 03/26/2014    Priority: Low  . History of colon cancer 01/27/2014    Priority: Low  . IRITIS 12/17/2008    Priority: Low  . 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 BIGEMINY 04/21/2008    Priority: Low  . 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 amLODipine (NORVASC) 5 MG tablet TAKE 1 TABLET BY MOUTH EVERY DAY 90 tablet 3  . aspirin 81 MG tablet Take 81 mg by mouth daily.    Marland Kitchen atorvastatin (LIPITOR) 20 MG tablet TAKE 1 TABLET ONCE A WEEK 12 tablet 1  . glimepiride (AMARYL) 4 MG tablet TAKE 2 TABLETS BY MOUTH DAILY BEFORE BREAKFAST. 180 tablet 1  . glucose blood (ONE TOUCH ULTRA TEST) test strip 1 each by Other route daily. Use as instructed 100 each 3  . JANUVIA 100 MG tablet TAKE 1 TABLET BY MOUTH EVERY DAY 90 tablet 1  . metFORMIN  (GLUCOPHAGE) 1000 MG tablet TAKE 1 TABLET (1,000 MG TOTAL) BY MOUTH 2 (TWO) TIMES DAILY WITH A MEAL. 180 tablet 3  . Multiple Vitamins-Minerals (CENTRUM SILVER PO) Take 1 tablet by mouth daily.     Marland Kitchen telmisartan (MICARDIS) 40 MG tablet TAKE 1 TABLET BY MOUTH EVERY DAY 90 tablet 3   No current facility-administered medications for this visit.      Objective:  BP 139/79   Pulse 71   Temp 97.9 F (36.6 C)   Ht 6\' 1"  (1.854 m)   Wt 206 lb (93.4 kg)   SpO2 97%   BMI 27.18 kg/m  Gen: NAD, resting comfortably CV: RRR no murmurs rubs or gallops Lungs: CTAB no crackles, wheeze, rhonchi Abdomen: soft/nontender/nondistended/normal bowel sounds.  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 -Noted amputation of second ray right foot after  prolonged corn pad use CBGs-has been on prednisone due to polymyalgia rheumatica but now off- blood sugars last visit in the morning were in the 140s-now 131 this AM  Exercise and diet- walking 3 miles daily.  Weight up a few lbs with being at home more- snacks some with covid 19. Up 5 lbs from last in office.  Lab Results  Component Value Date   HGBA1C 7.2 (H) 10/24/2018   HGBA1C 7.3 (A) 03/26/2018   HGBA1C 7.4 09/14/2017   A/P: Reasonable control particularly with recent prednisone use-continue current medications and follow-up in 3 months. -Overweight- Encouraged need for healthy eating, regular exercise, weight loss.   - Prior amputation stable - foot exam today   #hypertension S: controlled on amlodipine 5 mg and telmisartan 40 mg on repeat. Higher this AM after 2 cups of coffee BP Readings from Last 3 Encounters:  10/29/18 139/79  07/24/18 120/80  03/26/18 132/76  A/P: Stable. Continue current medications.    #hyperlipidemia S:  controlled on atorvastatin 20 mg once a week Lab Results  Component Value Date   CHOL 156 03/26/2018   HDL 41.20 03/26/2018   LDLCALC 91 03/26/2018   LDLDIRECT 90.0 09/14/2017   TRIG 123.0 03/26/2018   CHOLHDL 4 03/26/2018   A/P: Stable. Continue current medications.    #Polymyalgia rheumatica S: Thankfully patient has been able to come off of prednisone completely-he is doing well- he still looks back and wonders if it was related to tetanus shot  A/P:seems to have resolved- continue to monitor   No problem-specific Assessment & Plan notes found for this encounter.   Future Appointments  Date Time Provider [place]  01/22/2019 11:00 AM [facility] HE[facility] COACH [facility]  02/25/2019  8:00 AM [name], MD [facility]   Return in about 4 months (around 02/28/2019) for physical.  Lab/Order associations:   ICD-10-CM   1. Controlled type 2 diabetes mellitus without complication, without long-term current use of insulin (HCC)  E11.9   2. Hyperlipidemia associated with type 2 diabetes mellitus ([place])  E11.69    E78.5   3. Hypertension associated with diabetes ([place])  E11.59    I10   4. Acquired absence of other right toe(s) (HCC) Chronic Z89.421   5. Overweight  E66.3     No orders of the defined types were placed in this encounter.   Return precautions advised.  [name], MD

## 2018-12-20 LAB — HM DIABETES EYE EXAM

## 2018-12-26 ENCOUNTER — Other Ambulatory Visit: Payer: Self-pay | Admitting: Family Medicine

## 2019-01-08 ENCOUNTER — Ambulatory Visit (INDEPENDENT_AMBULATORY_CARE_PROVIDER_SITE_OTHER): Payer: Medicare Other

## 2019-01-08 ENCOUNTER — Other Ambulatory Visit: Payer: Self-pay

## 2019-01-08 DIAGNOSIS — Z23 Encounter for immunization: Secondary | ICD-10-CM

## 2019-01-22 ENCOUNTER — Ambulatory Visit: Payer: Medicare Other

## 2019-02-20 NOTE — Patient Instructions (Addendum)
Health Maintenance Due  Topic Date Due  . OPHTHALMOLOGY EXAM -01/2019 [facility]). Team- please request these records 12/19/2018   Please check with your pharmacy to see if they have the shingrix vaccine. If they do- please get this immunization and update [place] by phone call or mychart with dates you receive the vaccine   6 months if a1c is under 7, 4 months if over 7  We will call you within two weeks about your referral to podiatry. If you do not hear within 2-3 weeks, give [place] a call.    Please stop by lab before you go If you do not have mychart- we will call you about results within 5 business days of [place] receiving them.  If you have mychart- we will send your results within 3 business days of [place] receiving them.  If abnormal or we want to clarify a result, we will call or mychart you to make sure you receive the message.  If you have questions or concerns or don't hear within 5-7 days, please send [place] a message or call us.

## 2019-02-20 NOTE — Progress Notes (Signed)
Phone: [phone]   Subjective:  Patient presents today for their annual physical. Chief complaint-noted.   See problem oriented charting- ROS- full  review of systems was completed and negative  except for: neck stiffness.  The following were reviewed and entered/updated in epic: Past Medical History:  Diagnosis Date  . Colon cancer ([place]) 1992   colon resection 1992  . Complication of anesthesia    bladder problems after surgery from Anesthesia- can not urinate  . Diabetes mellitus    type 2  . History of shingles   . Hyperlipidemia   . Hypertension   . Osteomyelitis ([place])    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  . Sleep apnea    uses CPAP nightly   Patient Active Problem List   Diagnosis Date Noted  . Polymyalgia rheumatica ([place]) 10/17/2017    Priority: High  . BPH (benign prostatic hyperplasia) 07/02/2015    Priority: High  . Diabetes mellitus type II, controlled ([place]) 04/23/2007    Priority: High  . Hyperlipidemia associated with type 2 diabetes mellitus ([place]) 04/23/2007    Priority: Medium  . Obstructive sleep apnea 04/23/2007    Priority: Medium  . Hypertension associated with diabetes ([place]) 04/23/2007    Priority: Medium  . 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 Sepsis secondary to UTI ([place]) 03/29/2015    Priority: Low  . Hallux valgus of right foot 03/26/2014    Priority: Low  . History of colon cancer 01/27/2014    Priority: Low  . IRITIS 12/17/2008    Priority: Low  . 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 BIGEMINY 04/21/2008    Priority: Low  . 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 AMPUTATION TOE Right 09/14/2016   Procedure: RIGHT 2ND TOE/RAY AMPUTATION;  Surgeon: Leandrew Koyanagi, MD;  Location: [facility];  Service: Orthopedics;  Laterality: Right;  . APPENDECTOMY  1949  .  COLECTOMY  1992  . CYSTOSCOPY N/A 06/21/2015   Procedure: CYSTOSCOPY, CLOT EVACUATION, AND CAUTERIZATION OF PROSTATE FOSSA;  Surgeon: Carolan Clines, MD;  Location: [facility];  Service: Urology;  Laterality: N/A;  . HEMORRHOID SURGERY    . TRANSURETHRAL RESECTION OF PROSTATE N/A 05/23/2015   Procedure: TRANSURETHRAL RESECTION OF THE PROSTATE (TURP);  Surgeon: Irine Seal, MD;  Location: [facility];  Service: Urology;  Laterality: N/A;    Family History  Problem Relation Age of Onset  . Colon cancer Father 66  . Diabetes Father        type I  . Alzheimer's disease Mother   . Cancer Unknown        colon/fhx  . Rectal cancer Maternal Uncle   . Colon cancer Paternal Grandmother 80    Medications- reviewed and updated Current Outpatient Medications  Medication Sig Dispense Refill  . amLODipine (NORVASC) 5 MG tablet TAKE 1 TABLET BY MOUTH EVERY DAY 90 tablet 3  . aspirin 81 MG tablet Take 81 mg by mouth daily.    Marland Kitchen atorvastatin (LIPITOR) 20 MG tablet TAKE 1 TABLET ONCE A WEEK 12 tablet 1  . glimepiride (AMARYL) 4 MG tablet TAKE 2 TABLETS BY MOUTH DAILY BEFORE BREAKFAST. 180 tablet 1  . JANUVIA 100 MG tablet TAKE 1 TABLET BY MOUTH EVERY DAY 90 tablet 1  . metFORMIN (GLUCOPHAGE) 1000 MG tablet TAKE 1 TABLET BY MOUTH 2  TIMES DAILY WITH A MEAL. 180 tablet 3  . Multiple Vitamins-Minerals (CENTRUM SILVER PO) Take 1 tablet by mouth daily.     Glory Rosebush ULTRA test strip USE ONCE DAILY AS INSTRUCTED 100 strip 3  . telmisartan (MICARDIS) 40 MG tablet TAKE 1 TABLET BY MOUTH EVERY DAY 90 tablet 3   No current facility-administered medications for this visit.     Allergies-reviewed and updated Allergies  Allergen Reactions  . Bactrim [Sulfamethoxazole-Trimethoprim] Nausea And Vomiting    Pt has chills and fevers also.    Social History   Social History Narrative   Married 1966. 2 daughters Sharee Pimple divorced lives in [place] works for [employer] no kids and Mateo Flow never married in [place], [place] working for  [employer] for [employer] improvement no kids.       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 TM normal. Bridge of nose normal. Eyelids normal.  Neck: no thyromegaly or cervical lymphadenopathy  CV: RRR no murmurs rubs or gallops Lungs: CTAB no crackles, wheeze, rhonchi Abdomen: soft/nontender/nondistended/normal bowel sounds. No rebound or guarding.  Ext: no edema Skin: warm, dry, right great toe turned inward- history amputation 2nd ray. Near IP joint note a < 1 cm ulceration- appears to be healing  Neuro: grossly normal, moves all extremities, PERRLA       Assessment and Plan  78 y.o. male presenting for annual physical.  Health Maintenance counseling: 1. Anticipatory guidance: Patient counseled regarding regular dental exams -q6 months, eye exams-has diabetes and gets these exams-yearly.  We will try to obtain a copy of his most recent exam,  avoiding smoking and second hand smoke , limiting alcohol to 2 beverages per day - doesn't drink.   2. Risk factor reduction:  Advised patient of need for regular exercise and diet rich and fruits and vegetables to reduce risk of heart attack and stroke. Exercise- exercise down lately- encouraged 150 minutes a week. Diet- eating at home mainly- cooking there- feels pretty reasonable. Daughter has been cooking some delicious items and thinks he could eat less of - she is leaving soon so think it improves Wt Readings from Last 3 Encounters:  02/25/19 201 lb 12.8 oz (91.5 kg)  10/29/18 206 lb (93.4 kg)  07/24/18 199 lb (90.3 kg)  3. Immunizations/screenings/ancillary  studies-discussed Shingrix at pharmacy  Immunization History  Administered Date(s) Administered  . Fluad Quad(high Dose 65+) 01/08/2019  . Influenza Split 01/31/2011, 01/16/2012  . Influenza Whole 03/07/2007, 01/31/2008, 01/30/2009  . Influenza, High Dose Seasonal PF 01/11/2017, 02/05/2018  . Influenza,inj,Quad PF,6+ Mos 01/21/2013, 01/27/2014, 12/26/2014  . Influenza-Unspecified 01/05/2016  . Pneumococcal Conjugate-13 08/19/2013  . Pneumococcal Polysaccharide-23 04/04/2007  . Td 04/04/2007, 09/14/2017  . Varicella 08/19/2013  4. Prostate cancer screening- BPH status post TURP-does not follow with urology anymore.  Dr. Jeffie Pollock is his neighbor.  Past age based screening recommendations so will not trend PSA. Pees once a night or not at all  5. Colon cancer screening - November 13, 2014 with 5-year follow-up 6. Skin cancer screening-saw  dermatology this year. advised regular sunscreen use. Denies worrisome, changing, or new skin lesions.  7.  Never smoker  Status of chronic or acute concerns  Diabetes- controlled on Amaryl 8mg  and metformin 1000mg  BID and Januvia 100mg . CBG this AM was 149 and tends to be trending up. Has not been walking as much due to others not wearing masks and annoys him - we discussed if a1c 7.5 or less push for lifestyle changes- if aboe this could consider jardiance or change to ozempic from [place].   Serious UTI history - but prior to TURP. Doing much better now.  Lab Results  Component Value Date   HGBA1C 7.2 (H) 10/24/2018   Hyperlipidemia- controlled on Lipitor 20mg .  Discussed we could potentially push for LDL under 70- he would prefer to work on lifestyle unless LDL over 100.   Lab Results  Component Value Date   CHOL 156 03/26/2018   HDL 41.20 03/26/2018   LDLCALC 91 03/26/2018   LDLDIRECT 90.0 09/14/2017   TRIG 123.0 03/26/2018   CHOLHDL 4 03/26/2018    HTN-controlled on Amlodipine 5mg , telmisartan 40 mg on repeat- higher than home readings but just had  2 cups of coffee.   Benign prostatic hyperplasia with nocturia-stable after TURP  OSA on CPAP-compliant with CPAP   Right toe amputation history after prolonged use of corn pad-second ray-no recent issues- on great toe on right foot he was [place] sandals at the beach that seemed to wear into the great toe when walking- has ulceration that is improving in size but due to history of amputation we opted for referral to podiatry today- [facility].   Daughter being in town has been bad for his weight but good for mental health and also good for his wife.   Recommended follow up:  6 months if a1c is under 7, 4 months if over 7  Lab/Order associations:not fasting   ICD-10-CM   1. Preventative health care  Z00.00 CBC with Differential/Platelet    Comprehensive metabolic panel    Lipid panel    Hemoglobin A1c  2. Controlled type 2 diabetes mellitus without complication, without long-term current use of insulin (HCC)  E11.9 CBC with Differential/Platelet    Comprehensive metabolic panel    Lipid panel    Hemoglobin A1c  3. Benign prostatic hyperplasia with nocturia  N40.1    R35.1   4. Hyperlipidemia associated with type 2 diabetes mellitus (HCC)  E11.69 CBC with Differential/Platelet   E78.5 Comprehensive metabolic panel    Lipid panel  5. Hypertension associated with diabetes ([place])  E11.59    I10   6. Diabetic ulcer of right great toe [facility])  E11.621 Ambulatory referral to Podiatry   L97.519     No orders of the defined types were placed in this encounter.   Return precautions advised.  [name], MD

## 2019-02-25 ENCOUNTER — Encounter: Payer: Self-pay | Admitting: Family Medicine

## 2019-02-25 ENCOUNTER — Ambulatory Visit (INDEPENDENT_AMBULATORY_CARE_PROVIDER_SITE_OTHER): Payer: Medicare Other | Admitting: Family Medicine

## 2019-02-25 ENCOUNTER — Other Ambulatory Visit: Payer: Self-pay

## 2019-02-25 VITALS — BP 138/82 | HR 72 | Temp 97.7°F | Ht 73.0 in | Wt 201.8 lb

## 2019-02-25 DIAGNOSIS — E785 Hyperlipidemia, unspecified: Secondary | ICD-10-CM

## 2019-02-25 DIAGNOSIS — I152 Hypertension secondary to endocrine disorders: Secondary | ICD-10-CM

## 2019-02-25 DIAGNOSIS — L97519 Non-pressure chronic ulcer of other part of right foot with unspecified severity: Secondary | ICD-10-CM

## 2019-02-25 DIAGNOSIS — E1169 Type 2 diabetes mellitus with other specified complication: Secondary | ICD-10-CM

## 2019-02-25 DIAGNOSIS — E1159 Type 2 diabetes mellitus with other circulatory complications: Secondary | ICD-10-CM

## 2019-02-25 DIAGNOSIS — I10 Essential (primary) hypertension: Secondary | ICD-10-CM

## 2019-02-25 DIAGNOSIS — Z0000 Encounter for general adult medical examination without abnormal findings: Secondary | ICD-10-CM | POA: Diagnosis not present

## 2019-02-25 DIAGNOSIS — E11621 Type 2 diabetes mellitus with foot ulcer: Secondary | ICD-10-CM

## 2019-02-25 DIAGNOSIS — E119 Type 2 diabetes mellitus without complications: Secondary | ICD-10-CM

## 2019-02-25 DIAGNOSIS — N401 Enlarged prostate with lower urinary tract symptoms: Secondary | ICD-10-CM | POA: Diagnosis not present

## 2019-02-25 DIAGNOSIS — R351 Nocturia: Secondary | ICD-10-CM

## 2019-02-25 LAB — CBC WITH DIFFERENTIAL/PLATELET
Basophils Absolute: 0 10*3/uL (ref 0.0–0.1)
Basophils Relative: 0.3 % (ref 0.0–3.0)
Eosinophils Absolute: 0.2 10*3/uL (ref 0.0–0.7)
Eosinophils Relative: 2.6 % (ref 0.0–5.0)
HCT: 42.1 % (ref 39.0–52.0)
Hemoglobin: 14.2 g/dL (ref 13.0–17.0)
Lymphocytes Relative: 13.6 % (ref 12.0–46.0)
Lymphs Abs: 1 10*3/uL (ref 0.7–4.0)
MCHC: 33.8 g/dL (ref 30.0–36.0)
MCV: 93.5 fl (ref 78.0–100.0)
Monocytes Absolute: 0.5 10*3/uL (ref 0.1–1.0)
Monocytes Relative: 7.3 % (ref 3.0–12.0)
Neutro Abs: 5.4 10*3/uL (ref 1.4–7.7)
Neutrophils Relative %: 76.2 % (ref 43.0–77.0)
Platelets: 195 10*3/uL (ref 150.0–400.0)
RBC: 4.51 Mil/uL (ref 4.22–5.81)
RDW: 13.4 % (ref 11.5–15.5)
WBC: 7 10*3/uL (ref 4.0–10.5)

## 2019-02-25 LAB — LIPID PANEL
Cholesterol: 143 mg/dL (ref 0–200)
HDL: 37.7 mg/dL — ABNORMAL LOW
LDL Cholesterol: 79 mg/dL (ref 0–99)
NonHDL: 105.48
Total CHOL/HDL Ratio: 4
Triglycerides: 133 mg/dL (ref 0.0–149.0)
VLDL: 26.6 mg/dL (ref 0.0–40.0)

## 2019-02-25 LAB — COMPREHENSIVE METABOLIC PANEL WITH GFR
ALT: 21 U/L (ref 0–53)
AST: 16 U/L (ref 0–37)
Albumin: 4.4 g/dL (ref 3.5–5.2)
Alkaline Phosphatase: 71 U/L (ref 39–117)
BUN: 16 mg/dL (ref 6–23)
CO2: 27 meq/L (ref 19–32)
Calcium: 9.5 mg/dL (ref 8.4–10.5)
Chloride: 102 meq/L (ref 96–112)
Creatinine, Ser: 0.96 mg/dL (ref 0.40–1.50)
GFR: 75.7 mL/min
Glucose, Bld: 190 mg/dL — ABNORMAL HIGH (ref 70–99)
Potassium: 4.5 meq/L (ref 3.5–5.1)
Sodium: 139 meq/L (ref 135–145)
Total Bilirubin: 0.5 mg/dL (ref 0.2–1.2)
Total Protein: 6.8 g/dL (ref 6.0–8.3)

## 2019-02-25 LAB — HEMOGLOBIN A1C: Hgb A1c MFr Bld: 6.9 % — ABNORMAL HIGH (ref 4.6–6.5)

## 2019-03-01 ENCOUNTER — Ambulatory Visit: Payer: Medicare Other | Admitting: Podiatry

## 2019-03-01 ENCOUNTER — Other Ambulatory Visit: Payer: Self-pay

## 2019-03-01 ENCOUNTER — Encounter: Payer: Self-pay | Admitting: Family Medicine

## 2019-03-01 ENCOUNTER — Ambulatory Visit (INDEPENDENT_AMBULATORY_CARE_PROVIDER_SITE_OTHER): Payer: Medicare Other

## 2019-03-01 ENCOUNTER — Other Ambulatory Visit: Payer: Self-pay | Admitting: Podiatry

## 2019-03-01 VITALS — Temp 98.7°F

## 2019-03-01 DIAGNOSIS — Z89421 Acquired absence of other right toe(s): Secondary | ICD-10-CM

## 2019-03-01 DIAGNOSIS — L97511 Non-pressure chronic ulcer of other part of right foot limited to breakdown of skin: Secondary | ICD-10-CM

## 2019-03-01 DIAGNOSIS — M24176 Other articular cartilage disorders, unspecified foot: Secondary | ICD-10-CM

## 2019-03-01 DIAGNOSIS — M2011 Hallux valgus (acquired), right foot: Secondary | ICD-10-CM

## 2019-03-01 DIAGNOSIS — M249 Joint derangement, unspecified: Secondary | ICD-10-CM | POA: Diagnosis not present

## 2019-03-01 DIAGNOSIS — M79671 Pain in right foot: Secondary | ICD-10-CM

## 2019-03-02 NOTE — Progress Notes (Signed)
Subjective:  Patient ID: Walter [name], male    DOB: [birth year],  MRN: [id number]  Chief Complaint  Patient presents with  . Wound Check    Pt states right foot 1st digit callous/ulcer, 7 weeks duration, some slight drainage.  . Foot Problem    Pt states right 1st digit has bent over due to his right 2nd digit being amputated.    78 y.o. male presents with the above complaint.  States he has a history of amputation of the second toe several years ago.  Unsure the name of the provider who performed the procedure.  States that it happened from an infection.  Since then the bunion to his foot has gotten progressively worse and is difficulty wearing shoes she now has a sore on the great toe that has some drainage.  Review of Systems: Negative except as noted in the HPI. Denies N/V/F/Ch.  Past Medical History:  Diagnosis Date  . Colon cancer ([place]) 1992   colon resection 1992  . Complication of anesthesia    bladder problems after surgery from Anesthesia- can not urinate  . Diabetes mellitus    type 2  . History of shingles   . Hyperlipidemia   . Hypertension   . Osteomyelitis ([place])    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  . Sleep apnea    uses CPAP nightly    Current Outpatient Medications:  .  amLODipine (NORVASC) 5 MG tablet, TAKE 1 TABLET BY MOUTH EVERY DAY, Disp: 90 tablet, Rfl: 3 .  aspirin 81 MG tablet, Take 81 mg by mouth daily., Disp: , Rfl:  .  atorvastatin (LIPITOR) 20 MG tablet, TAKE 1 TABLET ONCE A WEEK, Disp: 12 tablet, Rfl: 1 .  glimepiride (AMARYL) 4 MG tablet, TAKE 2 TABLETS BY MOUTH DAILY BEFORE BREAKFAST., Disp: 180 tablet, Rfl: 1 .  JANUVIA 100 MG tablet, TAKE 1 TABLET BY MOUTH EVERY DAY, Disp: 90 tablet, Rfl: 1 .  metFORMIN (GLUCOPHAGE) 1000 MG tablet, TAKE 1 TABLET BY MOUTH 2 TIMES DAILY WITH A MEAL., Disp: 180 tablet, Rfl: 3 .  Multiple Vitamins-Minerals (CENTRUM SILVER PO), Take 1 tablet by mouth daily. , Disp: , Rfl:  .  ONETOUCH ULTRA test strip, USE ONCE DAILY AS INSTRUCTED, Disp:  100 strip, Rfl: 3 .  telmisartan (MICARDIS) 40 MG tablet, TAKE 1 TABLET BY MOUTH EVERY DAY, Disp: 90 tablet, Rfl: 3  Social History   Tobacco Use  Smoking Status Never Smoker  Smokeless Tobacco Never Used  Tobacco Comment   smoked for less that 6 months in 1960    Allergies  Allergen Reactions  . Bactrim [Sulfamethoxazole-Trimethoprim] Nausea And Vomiting    Pt has chills and fevers also.   Objective:   Vitals:   03/01/19 1429  Temp: 98.7 F (37.1 C)   There is no height or weight on file to calculate BMI. Constitutional Well developed. Well nourished.  Vascular Dorsalis pedis pulses palpable bilaterally. Posterior tibial pulses palpable bilaterally. Capillary refill normal to all digits.  No cyanosis or clubbing noted. Pedal hair growth normal.  Neurologic Normal speech. Oriented to person, place, and time. Epicritic sensation to light touch grossly present bilaterally.  Dermatologic Nails well groomed and normal in appearance. Superficial hallux IPJ ulcer right measuring 0.2 x 0.2 post debridement without warmth erythema or signs of acute infection  Orthopedic: Normal joint ROM without pain or crepitus bilaterally. No bony tenderness. History of right second toe amputation Severe hallux abductovalgus deformity right   Radiographs: Severe HAV deformity history of partial  second ray amputation.  Varus angulation of the great toe Assessment:   1. Hallux valgus, right   2. Hx of amputation of lesser toe, right ([place])   3. Ulcer of great toe, right, limited to breakdown of skin ([place])   4. Joint derangement of ankle or foot    Plan:  Patient was evaluated and treated and all questions answered.  Severe HAV right -Discussed with patient that ultimately he may benefit from surgical fusion of the first metatarsophalangeal joint given the severe deformity that he has an history of amputation of the second ray.  We did discuss that should heal it wish to consider this we  would have to have the ulceration healed further.  Ulcer right second toe -Selectively debrided as below -Dressed with antibiotic ointment and Band-Aid.  Patient to continue to do so daily -Surgical shoe dispensed  Procedure: Selective Debridement of Wound Rationale: Removal of devitalized tissue from the wound to promote healing.  Pre-Debridement Wound Measurements: 0.2 cm x 0.2 cm x 0.1 cm  Post-Debridement Wound Measurements: same as pre-debridement. Type of Debridement: sharp selective Tissue Removed: Devitalized soft-tissue Dressing: Dry, sterile, compression dressing. Disposition: Patient tolerated procedure well. Patient to return in 1 week for follow-up.    Return in about 2 weeks (around 03/15/2019) for Post-op.

## 2019-03-07 ENCOUNTER — Ambulatory Visit: Payer: Medicare Other

## 2019-03-11 ENCOUNTER — Ambulatory Visit (INDEPENDENT_AMBULATORY_CARE_PROVIDER_SITE_OTHER): Payer: Medicare Other

## 2019-03-11 ENCOUNTER — Other Ambulatory Visit: Payer: Self-pay

## 2019-03-11 VITALS — BP 130/76 | Temp 97.6°F | Ht 73.0 in | Wt 204.0 lb

## 2019-03-11 DIAGNOSIS — Z0000 Encounter for general adult medical examination without abnormal findings: Secondary | ICD-10-CM

## 2019-03-11 NOTE — Patient Instructions (Signed)
Walter [name] , Thank you for taking time to come for your Medicare Wellness Visit. I appreciate your ongoing commitment to your health goals. Please review the following plan we discussed and let me know if I can assist you in the future.   Screening recommendations/referrals: Colorectal Screening: up to date; last colonoscopy 11/13/14   Vision and Dental Exams: Recommended annual ophthalmology exams for early detection of glaucoma and other disorders of the eye Recommended annual dental exams for proper oral hygiene  Diabetic Exams: Diabetic Eye Exam: recommended yearly Diabetic Foot Exam: recommended yearly; up to date  Vaccinations: Influenza vaccine: completed 01/08/19 Pneumococcal vaccine: up to date; last 08/19/13 Tdap vaccine: up to date; last 09/14/17 Shingles vaccine: Please call your insurance company to determine your out of pocket expense for the Shingrix vaccine. You may receive this vaccine at your local pharmacy.  Advanced directives: Please bring a copy of your POA (Power of Attorney) and/or Living Will to your next appointment.  Goals: Recommend to drink at least 6-8 8oz glasses of water per day and consume a balanced diet rich in fresh fruits and vegetables.   Next appointment: Please schedule your Annual Wellness Visit with your Nurse Health Advisor in one year.  Preventive Care 50 Years and Older, Male Preventive care refers to lifestyle choices and visits with your health care provider that can promote health and wellness. What does preventive care include?  A yearly physical exam. This is also called an annual well check.  Dental exams once or twice a year.  Routine eye exams. Ask your health care provider how often you should have your eyes checked.  Personal lifestyle choices, including:  Daily care of your teeth and gums.  Regular physical activity.  Eating a healthy diet.  Avoiding tobacco and drug use.  Limiting alcohol use.  Practicing safe sex.   Taking low doses of aspirin every day if recommended by your health care provider..  Taking vitamin and mineral supplements as recommended by your health care provider. What happens during an annual well check? The services and screenings done by your health care provider during your annual well check will depend on your age, overall health, lifestyle risk factors, and family history of disease. Counseling  Your health care provider may ask you questions about your:  Alcohol use.  Tobacco use.  Drug use.  Emotional well-being.  Home and relationship well-being.  Sexual activity.  Eating habits.  History of falls.  Memory and ability to understand (cognition).  Work and work Statistician. Screening  You may have the following tests or measurements:  Height, weight, and BMI.  Blood pressure.  Lipid and cholesterol levels. These may be checked every 5 years, or more frequently if you are over 91 years old.  Skin check.  Lung cancer screening. You may have this screening every year starting at age 29 if you have a 30-pack-year history of smoking and currently smoke or have quit within the past 15 years.  Fecal occult blood test (FOBT) of the stool. You may have this test every year starting at age 24.  Flexible sigmoidoscopy or colonoscopy. You may have a sigmoidoscopy every 5 years or a colonoscopy every 10 years starting at age 18.  Prostate cancer screening. Recommendations will vary depending on your family history and other risks.  Hepatitis C blood test.  Hepatitis B blood test.  Sexually transmitted disease (STD) testing.  Diabetes screening. This is done by checking your blood sugar (glucose) after you have not  eaten for a while (fasting). You may have this done every 1-3 years.  Abdominal aortic aneurysm (AAA) screening. You may need this if you are a current or former smoker.  Osteoporosis. You may be screened starting at age 56 if you are at high risk.  Talk with your health care provider about your test results, treatment options, and if necessary, the need for more tests. Vaccines  Your health care provider may recommend certain vaccines, such as:  Influenza vaccine. This is recommended every year.  Tetanus, diphtheria, and acellular pertussis (Tdap, Td) vaccine. You may need a Td booster every 10 years.  Zoster vaccine. You may need this after age 3.  Pneumococcal 13-valent conjugate (PCV13) vaccine. One dose is recommended after age 96.  Pneumococcal polysaccharide (PPSV23) vaccine. One dose is recommended after age 30. Talk to your health care provider about which screenings and vaccines you need and how often you need them. This information is not intended to replace advice given to you by your health care provider. Make sure you discuss any questions you have with your health care provider. Document Released: 05/15/2015 Document Revised: 01/06/2016 Document Reviewed: 02/17/2015 Elsevier Interactive Patient Education  2017 [place] Prevention in the Home Falls can cause injuries. They can happen to people of all ages. There are many things you can do to make your home safe and to help prevent falls. What can I do on the outside of my home?  Regularly fix the edges of walkways and driveways and fix any cracks.  Remove anything that might make you trip as you walk through a door, such as a raised step or threshold.  Trim any bushes or trees on the path to your home.  Use bright outdoor lighting.  Clear any walking paths of anything that might make someone trip, such as rocks or tools.  Regularly check to see if handrails are loose or broken. Make sure that both sides of any steps have handrails.  Any raised decks and porches should have guardrails on the edges.  Have any leaves, snow, or ice cleared regularly.  Use sand or salt on walking paths during winter.  Clean up any spills in your garage right away.  This includes oil or grease spills. What can I do in the bathroom?  Use night lights.  Install grab bars by the toilet and in the tub and shower. Do not use towel bars as grab bars.  Use non-skid mats or decals in the tub or shower.  If you need to sit down in the shower, use a plastic, non-slip stool.  Keep the floor dry. Clean up any water that spills on the floor as soon as it happens.  Remove soap buildup in the tub or shower regularly.  Attach bath mats securely with double-sided non-slip rug tape.  Do not have throw rugs and other things on the floor that can make you trip. What can I do in the bedroom?  Use night lights.  Make sure that you have a light by your bed that is easy to reach.  Do not use any sheets or blankets that are too big for your bed. They should not hang down onto the floor.  Have a firm chair that has side arms. You can use this for support while you get dressed.  Do not have throw rugs and other things on the floor that can make you trip. What can I do in the kitchen?  Clean up any spills  right away.  Avoid walking on wet floors.  Keep items that you use a lot in easy-to-reach places.  If you need to reach something above you, use a strong step stool that has a grab bar.  Keep electrical cords out of the way.  Do not use floor polish or wax that makes floors slippery. If you must use wax, use non-skid floor wax.  Do not have throw rugs and other things on the floor that can make you trip. What can I do with my stairs?  Do not leave any items on the stairs.  Make sure that there are handrails on both sides of the stairs and use them. Fix handrails that are broken or loose. Make sure that handrails are as long as the stairways.  Check any carpeting to make sure that it is firmly attached to the stairs. Fix any carpet that is loose or worn.  Avoid having throw rugs at the top or bottom of the stairs. If you do have throw rugs, attach them  to the floor with carpet tape.  Make sure that you have a light switch at the top of the stairs and the bottom of the stairs. If you do not have them, ask someone to add them for you. What else can I do to help prevent falls?  Wear shoes that:  Do not have high heels.  Have rubber bottoms.  Are comfortable and fit you well.  Are closed at the toe. Do not wear sandals.  If you use a stepladder:  Make sure that it is fully opened. Do not climb a closed stepladder.  Make sure that both sides of the stepladder are locked into place.  Ask someone to hold it for you, if possible.  Clearly mark and make sure that you can see:  Any grab bars or handrails.  First and last steps.  Where the edge of each step is.  Use tools that help you move around (mobility aids) if they are needed. These include:  Canes.  Walkers.  Scooters.  Crutches.  Turn on the lights when you go into a dark area. Replace any light bulbs as soon as they burn out.  Set up your furniture so you have a clear path. Avoid moving your furniture around.  If any of your floors are uneven, fix them.  If there are any pets around you, be aware of where they are.  Review your medicines with your doctor. Some medicines can make you feel dizzy. This can increase your chance of falling. Ask your doctor what other things that you can do to help prevent falls. This information is not intended to replace advice given to you by your health care provider. Make sure you discuss any questions you have with your health care provider. Document Released: 02/12/2009 Document Revised: 09/24/2015 Document Reviewed: 05/23/2014 Elsevier Interactive Patient Education  2017 [employer].

## 2019-03-11 NOTE — Progress Notes (Signed)
I have reviewed and agree with note, evaluation, plan.   Stephen Hunter, MD  

## 2019-03-11 NOTE — Progress Notes (Signed)
Subjective:   Walter [name] is a 78 y.o. male who presents for Medicare Annual/Subsequent preventive examination.  Review of Systems:   Cardiac Risk Factors include: advanced age (>51men, >71 women);hypertension;male gender;diabetes mellitus    Objective:    Vitals: BP 130/76   Temp 97.6 F (36.4 C)   Ht 6\' 1"  (1.854 m)   Wt 204 lb (92.5 kg)   BMI 26.91 kg/m   Body mass index is 26.91 kg/m.  Advanced Directives 03/11/2019 12/26/2017 09/14/2016 09/13/2016 02/19/2016 02/19/2016 06/20/2015  Does Patient Have a Medical Advance Directive? Yes Yes Yes Yes Yes Yes No  Type of Advance Directive Living will;Healthcare Power of [place];Living will - Living will - - -  Does patient want to make changes to medical advance directive? No - Patient declined No - Patient declined - - - - -  Copy of [place] in Chart? No - copy requested No - copy requested - - No - copy requested - -  Would patient like information on creating a medical advance directive? - - - - - - No - patient declined information    Tobacco Social History   Tobacco Use  Smoking Status Never Smoker  Smokeless Tobacco Never Used  Tobacco Comment   smoked for less that 6 months in 1960     Counseling given: Not Answered Comment: smoked for less that 6 months in 1960   Clinical Intake:  Pre-visit preparation completed: Yes  Pain : No/denies pain  Diabetes: Yes CBG done?: No Did pt. bring in CBG monitor from home?: No(patient reports fasting glucose this am to be 142)  How often do you need to have someone help you when you read instructions, pamphlets, or other written materials from your doctor or pharmacy?: 1 - Never  Interpreter Needed?: No  Information entered by :: Denman George LPN  Past Medical History:  Diagnosis Date  . Colon cancer ([place]) 1992   colon resection 1992  . Complication of anesthesia    bladder problems after surgery from Anesthesia- can  not urinate  . Diabetes mellitus    type 2  . History of shingles   . Hyperlipidemia   . Hypertension   . Osteomyelitis ([place])    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  . Sleep apnea    uses CPAP nightly   Past Surgical History:  Procedure Laterality Date  . AMPUTATION TOE Right 09/14/2016   Procedure: RIGHT 2ND TOE/RAY AMPUTATION;  Surgeon: Leandrew Koyanagi, MD;  Location: [facility];  Service: Orthopedics;  Laterality: Right;  . APPENDECTOMY  1949  . COLECTOMY  1992  . CYSTOSCOPY N/A 06/21/2015   Procedure: CYSTOSCOPY, CLOT EVACUATION, AND CAUTERIZATION OF PROSTATE FOSSA;  Surgeon: Carolan Clines, MD;  Location: [facility];  Service: Urology;  Laterality: N/A;  . HEMORRHOID SURGERY    . TRANSURETHRAL RESECTION OF PROSTATE N/A 05/23/2015   Procedure: TRANSURETHRAL RESECTION OF THE PROSTATE (TURP);  Surgeon: Irine Seal, MD;  Location: [facility];  Service: Urology;  Laterality: N/A;   Family History  Problem Relation Age of Onset  . Colon cancer Father 69  . Diabetes Father        type I  . Alzheimer's disease Mother   . Cancer Unknown        colon/fhx  . Rectal cancer Maternal Uncle   . Colon cancer Paternal Grandmother 31   Social History   Socioeconomic History  . Marital status: Married    Spouse  name: Not on file  . Number of children: Not on file  . Years of education: Not on file  . Highest education level: Not on file  Occupational History  . Not on file  Social Needs  . Financial resource strain: Not on file  . Food insecurity    Worry: Not on file    Inability: Not on file  . Transportation needs    Medical: Not on file    Non-medical: Not on file  Tobacco Use  . Smoking status: Never Smoker  . Smokeless tobacco: Never Used  . Tobacco comment: smoked for less that 6 months in 1960  Substance and Sexual Activity  . Alcohol use: Yes    Alcohol/week: 2.0 standard drinks    Types: 2 Cans of beer per week    Comment: social  . Drug use: No  . Sexual activity: Not on  file  Lifestyle  . Physical activity    Days per week: Not on file    Minutes per session: Not on file  . Stress: Not on file  Relationships  . Social Herbalist on phone: Not on file    Gets together: Not on file    Attends religious service: Not on file    Active member of club or organization: Not on file    Attends meetings of clubs or organizations: Not on file    Relationship status: Not on file  Other Topics Concern  . Not on file  Social History Narrative   Married 1966. 2 daughters Sharee Pimple divorced lives in [place] works for [employer] no kids and Mateo Flow never married in [place], [place] working for [employer] for [employer] improvement no kids.       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)      No religious beliefs affecting health care, no afterlife beliefs    Outpatient Encounter Medications as of 03/11/2019  Medication Sig  . amLODipine (NORVASC) 5 MG tablet TAKE 1 TABLET BY MOUTH EVERY DAY  . aspirin 81 MG tablet Take 81 mg by mouth daily.  Marland Kitchen atorvastatin (LIPITOR) 20 MG tablet TAKE 1 TABLET ONCE A WEEK  . glimepiride (AMARYL) 4 MG tablet TAKE 2 TABLETS BY MOUTH DAILY BEFORE BREAKFAST.  Marland Kitchen JANUVIA 100 MG tablet TAKE 1 TABLET BY MOUTH EVERY DAY  . metFORMIN (GLUCOPHAGE) 1000 MG tablet TAKE 1 TABLET BY MOUTH 2 TIMES DAILY WITH A MEAL.  . Multiple Vitamins-Minerals (CENTRUM SILVER PO) Take 1 tablet by mouth daily.   Glory Rosebush ULTRA test strip USE ONCE DAILY AS INSTRUCTED  . telmisartan (MICARDIS) 40 MG tablet TAKE 1 TABLET BY MOUTH EVERY DAY   No facility-administered encounter medications on file as of 03/11/2019.     Activities of Daily Living In your present state of health, do you have any difficulty performing the following activities: 03/11/2019  Hearing? N  Vision? N  Difficulty concentrating or making decisions? N  Walking or climbing stairs? N  Dressing or bathing? N  Doing  errands, shopping? N  Preparing Food and eating ? N  Using the Toilet? N  In the past six months, have you accidently leaked urine? N  Do you have problems with loss of bowel control? N  Managing your Medications? N  Managing your Finances? N  Housekeeping or managing your Housekeeping? N  Some recent data might be hidden    Patient Care Team: [name]  Jenetta Downer, MD as PCP - General (Family Medicine) Michael Boston, MD as Consulting Physician (General Surgery) Ladene Artist, MD as Consulting Physician (Gastroenterology) Irine Seal, MD as Attending Physician (Urology) Shon Hough, MD as Consulting Physician (Ophthalmology)   Assessment:   This is a routine wellness examination for [place].  Exercise Activities and Dietary recommendations Current Exercise Habits: Home exercise routine, Type of exercise: walking, Time (Minutes): 40, Frequency (Times/Week): 7, Weekly Exercise (Minutes/Week): 280, Intensity: Mild  Goals    . patient     Get A1c down to 6; need to continue exercising; Portion control Fat free or low fat dairy products Fish high in omega-3 acids ( salmon, tuna, trout) Fruits, such as apples, bananas, oranges, pears, prunes Legumes, such as kidney beans, lentils, checkpeas, black-eyed peas and lima beans Vegetables; broccoli, cabbage, carrots Whole grains;   Plant fats are better; decrease "white" foods as pasta, rice, bread and desserts, sugar; Avoid red meat (limiting) palm and coconut oils; sugary foods and beverages  Two nutrients that raise blood chol levels are saturated fats and trans fat; in hydrogenated oils and fats, as stick margarine, baked goods (cookes, cakes, pies, crackers; frosting; and coffee creamers;   Some Fats lower cholesterol: Monounsaturated and polyunsaturated  Avocados Corn, sunflower, and soybean oils Nuts and seeds, such as walnuts Olive, canola, peanut, safflower, and sesame oils Peanut butter Salmon and trout Tofu           Fall Risk Fall Risk  03/11/2019 02/25/2019 12/26/2017 05/17/2017 03/08/2017  Falls in the past year? 0 0 No No No  Number falls in past yr: - 0 - - -  Injury with Fall? 0 0 - - -  Risk for fall due to : - - - - -  Follow up Falls evaluation completed;Education provided;Falls prevention discussed - - - -   Is the patient's home free of loose throw rugs in walkways, pet beds, electrical cords, etc?   yes      Grab bars in the bathroom? yes      Handrails on the stairs?   yes      Adequate lighting?   yes  Timed Get Up and Go Performed: completed and within normal timeframe; no gait abnormalities noted   Depression Screen PHQ 2/9 Scores 03/11/2019 02/25/2019 12/26/2017 05/17/2017  PHQ - 2 Score 0 0 0 0    Cognitive Function- no cognitive concerns at this time  MMSE - Mini Mental State Exam 02/19/2016  Not completed: (No Data)     6CIT Screen 03/11/2019 12/26/2017  What Year? 0 points 0 points  What month? 0 points 0 points  What time? 0 points 0 points  Count back from 20 0 points 0 points  Months in reverse 0 points 0 points  Repeat phrase 0 points -  Total Score 0 -    Immunization History  Administered Date(s) Administered  . Fluad Quad(high Dose 65+) 01/08/2019  . Influenza Split 01/31/2011, 01/16/2012  . Influenza Whole 03/07/2007, 01/31/2008, 01/30/2009  . Influenza, High Dose Seasonal PF 01/11/2017, 02/05/2018  . Influenza,inj,Quad PF,6+ Mos 01/21/2013, 01/27/2014, 12/26/2014  . Influenza-Unspecified 01/05/2016  . Pneumococcal Conjugate-13 08/19/2013  . Pneumococcal Polysaccharide-23 04/04/2007  . Td 04/04/2007, 09/14/2017  . Varicella 08/19/2013    Qualifies for Shingles Vaccine? Discussed and patient will check with pharmacy for coverage.  Patient education handout provided   Screening Tests Health Maintenance  Topic Date Due  . OPHTHALMOLOGY EXAM  12/19/2018  . HEMOGLOBIN A1C  08/26/2019  . FOOT  EXAM  10/29/2019  . COLONOSCOPY  11/13/2019  . TETANUS/TDAP   09/15/2027  . INFLUENZA VACCINE  Completed  . PNA vac Low Risk Adult  Completed   Cancer Screenings: Lung: Low Dose CT Chest recommended if Age 7-80 years, 30 pack-year currently smoking OR have quit w/in 15years. Patient does not qualify. Colorectal: colonoscopy 11/13/14      Plan:  I have personally reviewed and addressed the Medicare Annual Wellness questionnaire and have noted the following in the patient's chart:  A. Medical and social history B. Use of alcohol, tobacco or illicit drugs  C. Current medications and supplements D. Functional ability and status E.  Nutritional status F.  Physical activity G. Advance directives H. List of other physicians I.  Hospitalizations, surgeries, and ER visits in previous 12 months J.  [place] such as hearing and vision if needed, cognitive and depression L. Referrals, records requested, and appointments- will request diabetic eye exam notes   In addition, I have reviewed and discussed with patient certain preventive protocols, quality metrics, and best practice recommendations. A written personalized care plan for preventive services as well as general preventive health recommendations were provided to patient.   Signed,  Denman George, LPN  Nurse Health Advisor   Nurse Notes: no additional

## 2019-03-15 ENCOUNTER — Other Ambulatory Visit: Payer: Self-pay

## 2019-03-15 ENCOUNTER — Ambulatory Visit (INDEPENDENT_AMBULATORY_CARE_PROVIDER_SITE_OTHER): Payer: Medicare Other | Admitting: Podiatry

## 2019-03-15 DIAGNOSIS — M24176 Other articular cartilage disorders, unspecified foot: Secondary | ICD-10-CM

## 2019-03-15 DIAGNOSIS — M249 Joint derangement, unspecified: Secondary | ICD-10-CM | POA: Diagnosis not present

## 2019-03-15 DIAGNOSIS — Z89421 Acquired absence of other right toe(s): Secondary | ICD-10-CM | POA: Diagnosis not present

## 2019-03-15 DIAGNOSIS — L97511 Non-pressure chronic ulcer of other part of right foot limited to breakdown of skin: Secondary | ICD-10-CM

## 2019-03-15 DIAGNOSIS — M2011 Hallux valgus (acquired), right foot: Secondary | ICD-10-CM

## 2019-03-15 DIAGNOSIS — M24173 Other articular cartilage disorders, unspecified ankle: Secondary | ICD-10-CM

## 2019-03-15 NOTE — Patient Instructions (Signed)
Pre-Operative Instructions  Congratulations, you have decided to take an important step towards improving your quality of life.  You can be assured that the doctors and staff at [facility] will be with you every step of the way.  Here are some important things you should know:  1. Plan to be at the surgery center/hospital at least 1 (one) hour prior to your scheduled time, unless otherwise directed by the surgical center/hospital staff.  You must have a responsible adult accompany you, remain during the surgery and drive you home.  Make sure you have directions to the surgical center/hospital to ensure you arrive on time. 2. If you are having surgery at [facility] or [facility], you will need a copy of your medical history and physical form from your family physician within one month prior to the date of surgery. We will give you a form for your primary physician to complete.  3. We make every effort to accommodate the date you request for surgery.  However, there are times where surgery dates or times have to be moved.  We will contact you as soon as possible if a change in schedule is required.   4. No aspirin/ibuprofen for one week before surgery.  If you are on aspirin, any non-steroidal anti-inflammatory medications (Mobic, Aleve, Ibuprofen) should not be taken seven (7) days prior to your surgery.  You make take Tylenol for pain prior to surgery.  5. Medications - If you are taking daily heart and blood pressure medications, seizure, reflux, allergy, asthma, anxiety, pain or diabetes medications, make sure you notify the surgery center/hospital before the day of surgery so they can tell you which medications you should take or avoid the day of surgery. 6. No food or drink after midnight the night before surgery unless directed otherwise by surgical center/hospital staff. 7. No alcoholic beverages 24-hours prior to surgery.  No smoking 24-hours prior or 24-hours after  surgery. 8. Wear loose pants or shorts. They should be loose enough to fit over bandages, boots, and casts. 9. Don't wear slip-on shoes. Sneakers are preferred. 10. Bring your boot with you to the surgery center/hospital.  Also bring crutches or a walker if your physician has prescribed it for you.  If you do not have this equipment, it will be provided for you after surgery. 11. If you have not been contacted by the surgery center/hospital by the day before your surgery, call to confirm the date and time of your surgery. 12. Leave-time from work may vary depending on the type of surgery you have.  Appropriate arrangements should be made prior to surgery with your employer. 13. Prescriptions will be provided immediately following surgery by your doctor.  Fill these as soon as possible after surgery and take the medication as directed. Pain medications will not be refilled on weekends and must be approved by the doctor. 14. Remove nail polish on the operative foot and avoid getting pedicures prior to surgery. 15. Wash the night before surgery.  The night before surgery wash the foot and leg well with water and the antibacterial soap provided. Be sure to pay special attention to beneath the toenails and in between the toes.  Wash for at least three (3) minutes. Rinse thoroughly with water and dry well with a towel.  Perform this wash unless told not to do so by your physician.  Enclosed: 1 Ice pack (please put in freezer the night before surgery)   1 Hibiclens skin cleaner     Pre-op instructions  If you have any questions regarding the instructions, please do not hesitate to call our office.  [place]: [address], [place], [place] [address] -- [phone]  [place]: [address] [place], [place] [address] -- [phone]  [place]: [address]  [place],  [address] -- [phone]   Website: [email or web address] 

## 2019-03-20 ENCOUNTER — Telehealth: Payer: Self-pay | Admitting: *Deleted

## 2019-03-20 DIAGNOSIS — M2011 Hallux valgus (acquired), right foot: Secondary | ICD-10-CM

## 2019-03-20 DIAGNOSIS — L97511 Non-pressure chronic ulcer of other part of right foot limited to breakdown of skin: Secondary | ICD-10-CM

## 2019-03-20 DIAGNOSIS — E11621 Type 2 diabetes mellitus with foot ulcer: Secondary | ICD-10-CM

## 2019-03-20 DIAGNOSIS — Z01812 Encounter for preprocedural laboratory examination: Secondary | ICD-10-CM

## 2019-03-20 NOTE — Telephone Encounter (Signed)
I am calling to see if we can schedule your surgery for Monday, March 25, 2019.  "Yes, that date should be fine.  What time and where will I be going?"  The surgery will be at [facility].  The surgery will start around 1:15 pm.  You will probably have to arrive two hours prior to that time.  You also will need to have a Covid test.  Someone from pre-testing will call you to schedule that appointment.  "How many days ahead of my surgery date should that be done?"  The Covid test should be done three to four days prior to your surgery date.  I am going to put the order in for the knee scooter with [facility].  You should get at call from them.  "Will they deliver it or will I get it at the surgery center?"  You will need to pick it up at the store.  I will place an order for [place] as well.  Hopefully, someone from [facility] will give you a call in regards to setting that up. "Will someone check my insurance or is that something I have to do myself?"  I will check your insurance.  "I have it all written down.  There's something that I need to make Dr. March Rummage aware of, the last time I had surgery, my bladder did not wake up for 13 weeks.  So, my Urologist said if I was to ever have surgery again to ask the doctor to put in a catheter.  That shouldn't be a problem should it?"  I don't think it will.

## 2019-03-21 ENCOUNTER — Telehealth: Payer: Self-pay | Admitting: *Deleted

## 2019-03-21 ENCOUNTER — Telehealth: Payer: Self-pay | Admitting: Podiatry

## 2019-03-21 NOTE — Telephone Encounter (Signed)
Patient called concerned that there is a mix up for his surgery date and stated that he was told his surgery would be on Dec 16th when he was at his last appt.  Pt received a call today stating his surgery had been set for 11/23, next week and he is not able to have the surgery then.   Please give patient a call back.

## 2019-03-21 NOTE — Telephone Encounter (Addendum)
I received a message that you want me to call you regarding your surgery date.  "Yes, I thought my surgery was scheduled for April 17, 2019.  That's the date the girl gave me when I was there for my appointment.  She wrote it on my paperwork for that date."  I apologize, that date was not written on the paperwork that I received.  To be determined was written on the paperwork that I was given.  I called you yesterday and offered you November 23.  You told me that was fine.  The reason we offered you that date is because we received your history and physical form from your primary care physician (PCP).  Your visit with the PCP was dated 02/25/2019 it will be expired on 11.26.2020.  So, that's why we offered you 03/25/2019, to prevent you from having to schedule another visit with your PCP.  The history and physical must be completed within 30 days of the surgery date, you must have a visit with your doctor.  "I understand.  I told the doctor I wanted to do it after the Holiday.  I don't know where the miscommunication happened.  I would rather do it on April 17, 2019."  It looks like that date is available.  I'll get it scheduled. "I will schedule an appointment with my doctor.  Can you send me new forms so I can take them over to him?"  I am calling you back.  I tried to schedule your surgery for 04/17/2019 but they didn't have any time available for that date at the surgery center.  Dr. March Rummage can do it on 05/01/2019.  "That date is right there at [place] but that will be fine.  I'm not going to be doing anything anyway."  I'll get it rescheduled to 05/01/2019.  I called Quay Burow, surgery scheduler, and rescheduled the surgery from 03/25/2019 to 05/01/2019.

## 2019-03-21 NOTE — Telephone Encounter (Addendum)
RESCHEDULED DOS 05/01/2019 EXCISION BENIGN LESION 1.0 CM - 84132 AND HALLUX MPJ FUSION - 44010 RIGHT FOOT  [employer]: Eligibility Date - 05/02/2018 - 05/02/2019   Member's plan does not have a deductible.  Out-of-[place] Maximum Per Service Year $63.10 of $4,000.00 Met Remaining: $3,936.90  Co-pay $200 / Day OUTPATIENT SURGERY Co-Insurance 0% / [facility])  [place] $0.00 Copayment for professional services rendered to you as an Outpatient.    This [employer] Medicare Advantage members plan does not currently require a prior authorization for these services. If you have general questions about the prior authorization requirements, please call us at [phone] or visit [email or web address] > Clinician Resources > Advance and Admission Notification Requirements. The number above acknowledges your notification. Please write this number down for future reference. Notification is not a guarantee of coverage or payment.  Decision ID #:[id number]

## 2019-03-22 ENCOUNTER — Other Ambulatory Visit (HOSPITAL_COMMUNITY): Payer: Medicare Other

## 2019-04-16 NOTE — Progress Notes (Signed)
Left message with delydia, patient h and p faxed is dated 02-25-2019 and is nmore than 36 month old and cannot be used for 05-01-19 surgery

## 2019-04-17 ENCOUNTER — Telehealth: Payer: Self-pay | Admitting: *Deleted

## 2019-04-17 NOTE — Telephone Encounter (Signed)
"  You faxed me a H&P on Walter [name] for surgery on the 30th.  This H&P is not acceptable that you faxed.  It's from 10/26 and that's more than a month.  So if this doctor wants to use it, he's going to have sign that first page where it says H&P, you have to get a physician's signature and a current date of December on it.  You can't just fax the blank H&P thing with no date and then a office visit note.  If you have any questions, you may call us back.

## 2019-04-17 NOTE — Telephone Encounter (Signed)
"  I'm sorry I missed your call.  So there was a problem with the forms that you received?"  Yes, I received a call from the pre-admit nurse.  She said your doctor sent her the history and physical form along with clinical notes.  However, he did not sign nor date the history and physical form, it was left blank, and he sent clinicals that were not within the 30 day window.  "So what you're basically saying is that I need to see him for an appointment and I need to let him know the form has to be signed and dated and sent along with the clinical notes."  Yes, that is correct.  "How do I get another form?"  I can fax the forms to him.  What is his name?  "His name is Dr. [name]."  Do you have his phone number?  "It is [phone]."  I'll send him the forms.     "You know this has become such an ordeal.  I really was hesitant about having this surgery right now previously, now this.  I am scheduled for an appointment with Dr. Yong Channel in March, already.  I think I'll hold off on my surgery until then and just do it in the Spring."  I will let Dr. March Rummage know.  I will call [facility] and cancel your surgery.  Please call us if you need any conservative treatment in the meantime.  "I'll call back at a later date to reschedule it.  I will call and cancel my Covid test."  I called [place], [facility], and canceled Mr. Etheridge's surgery that's scheduled for 05/01/2019.

## 2019-04-17 NOTE — Telephone Encounter (Signed)
I attempted to call the patient.  I left him a message stating that his primary care physician (pcp) had sent the history and physical form to pre-admit.  I was informed by the nurse that the form was not acceptable.  I explained to him that he must have seen his primary care physician for a visit within a 30 window of the surgery date.   I asked him to schedule an appointment with his pcp if he has not seen him recently.  I asked him to give me a call back.

## 2019-04-18 ENCOUNTER — Other Ambulatory Visit: Payer: Self-pay | Admitting: Family Medicine

## 2019-04-18 ENCOUNTER — Encounter: Payer: Self-pay | Admitting: Family Medicine

## 2019-04-22 ENCOUNTER — Other Ambulatory Visit: Payer: Self-pay | Admitting: Family Medicine

## 2019-04-23 ENCOUNTER — Encounter: Payer: Self-pay | Admitting: Family Medicine

## 2019-04-24 ENCOUNTER — Other Ambulatory Visit: Payer: Self-pay

## 2019-05-01 ENCOUNTER — Ambulatory Visit (HOSPITAL_BASED_OUTPATIENT_CLINIC_OR_DEPARTMENT_OTHER): Admission: RE | Admit: 2019-05-01 | Payer: Medicare Other | Source: Home / Self Care | Admitting: Podiatry

## 2019-05-01 ENCOUNTER — Encounter (HOSPITAL_BASED_OUTPATIENT_CLINIC_OR_DEPARTMENT_OTHER): Admission: RE | Payer: Self-pay | Source: Home / Self Care

## 2019-05-01 SURGERY — FUSION, JOINT, GREAT TOE
Anesthesia: General | Laterality: Right

## 2019-06-05 ENCOUNTER — Telehealth: Payer: Self-pay

## 2019-06-05 NOTE — Telephone Encounter (Signed)
Form received from [employer] for c pap supplies. Called patient l/m to call office. We need to know when he had his sleep study and what his pressure settings are to fill out form. PPW placed above Keba's desk until patient calls back.

## 2019-06-07 ENCOUNTER — Telehealth: Payer: Self-pay

## 2019-06-07 NOTE — Telephone Encounter (Signed)
Patient returning call to provide additional information.

## 2019-06-07 NOTE — Telephone Encounter (Signed)
Spoke to patient

## 2019-06-18 ENCOUNTER — Encounter: Payer: Self-pay | Admitting: Family Medicine

## 2019-07-19 ENCOUNTER — Other Ambulatory Visit: Payer: Self-pay | Admitting: Family Medicine

## 2019-08-12 ENCOUNTER — Encounter: Payer: Self-pay | Admitting: Family Medicine

## 2019-08-19 ENCOUNTER — Encounter: Payer: Self-pay | Admitting: Internal Medicine

## 2019-08-19 ENCOUNTER — Ambulatory Visit (INDEPENDENT_AMBULATORY_CARE_PROVIDER_SITE_OTHER): Payer: Medicare Other | Admitting: Family Medicine

## 2019-08-19 ENCOUNTER — Other Ambulatory Visit: Payer: Self-pay

## 2019-08-19 ENCOUNTER — Encounter: Payer: Self-pay | Admitting: Family Medicine

## 2019-08-19 VITALS — BP 140/78 | HR 75 | Temp 97.6°F | Ht 73.0 in | Wt 199.6 lb

## 2019-08-19 DIAGNOSIS — E785 Hyperlipidemia, unspecified: Secondary | ICD-10-CM

## 2019-08-19 DIAGNOSIS — R413 Other amnesia: Secondary | ICD-10-CM

## 2019-08-19 DIAGNOSIS — E119 Type 2 diabetes mellitus without complications: Secondary | ICD-10-CM

## 2019-08-19 DIAGNOSIS — E1169 Type 2 diabetes mellitus with other specified complication: Secondary | ICD-10-CM | POA: Diagnosis not present

## 2019-08-19 DIAGNOSIS — I10 Essential (primary) hypertension: Secondary | ICD-10-CM

## 2019-08-19 DIAGNOSIS — E1159 Type 2 diabetes mellitus with other circulatory complications: Secondary | ICD-10-CM | POA: Diagnosis not present

## 2019-08-19 DIAGNOSIS — Z1211 Encounter for screening for malignant neoplasm of colon: Secondary | ICD-10-CM

## 2019-08-19 DIAGNOSIS — J301 Allergic rhinitis due to pollen: Secondary | ICD-10-CM

## 2019-08-19 DIAGNOSIS — K5900 Constipation, unspecified: Secondary | ICD-10-CM

## 2019-08-19 DIAGNOSIS — G4733 Obstructive sleep apnea (adult) (pediatric): Secondary | ICD-10-CM

## 2019-08-19 DIAGNOSIS — I152 Hypertension secondary to endocrine disorders: Secondary | ICD-10-CM

## 2019-08-19 DIAGNOSIS — J309 Allergic rhinitis, unspecified: Secondary | ICD-10-CM

## 2019-08-19 DIAGNOSIS — Z89421 Acquired absence of other right toe(s): Secondary | ICD-10-CM

## 2019-08-19 HISTORY — DX: Allergic rhinitis, unspecified: J30.9

## 2019-08-19 LAB — CBC
HCT: 40.1 % (ref 39.0–52.0)
Hemoglobin: 13.7 g/dL (ref 13.0–17.0)
MCHC: 34.1 g/dL (ref 30.0–36.0)
MCV: 93 fl (ref 78.0–100.0)
Platelets: 206 10*3/uL (ref 150.0–400.0)
RBC: 4.31 Mil/uL (ref 4.22–5.81)
RDW: 13.8 % (ref 11.5–15.5)
WBC: 6.3 10*3/uL (ref 4.0–10.5)

## 2019-08-19 LAB — HEMOGLOBIN A1C: Hgb A1c MFr Bld: 6.7 % — ABNORMAL HIGH (ref 4.6–6.5)

## 2019-08-19 LAB — COMPREHENSIVE METABOLIC PANEL
ALT: 19 U/L (ref 0–53)
AST: 15 U/L (ref 0–37)
Albumin: 4.5 g/dL (ref 3.5–5.2)
Alkaline Phosphatase: 70 U/L (ref 39–117)
BUN: 18 mg/dL (ref 6–23)
CO2: 29 mEq/L (ref 19–32)
Calcium: 9.8 mg/dL (ref 8.4–10.5)
Chloride: 103 mEq/L (ref 96–112)
Creatinine, Ser: 0.9 mg/dL (ref 0.40–1.50)
GFR: 81.45 mL/min (ref >60.00)
Glucose, Bld: 154 mg/dL — ABNORMAL HIGH (ref 70–99)
Potassium: 4.5 mEq/L (ref 3.5–5.1)
Sodium: 140 mEq/L (ref 135–145)
Total Bilirubin: 0.5 mg/dL (ref 0.2–1.2)
Total Protein: 7.1 g/dL (ref 6.0–8.3)

## 2019-08-19 LAB — VITAMIN B12: Vitamin B-12: 391 pg/mL (ref 211–911)

## 2019-08-19 LAB — TSH: TSH: 2.05 u[IU]/mL (ref 0.35–4.50)

## 2019-08-19 MED ORDER — ROSUVASTATIN CALCIUM 20 MG PO TABS: 20.0000 mg | ORAL_TABLET | ORAL | 3 refills | Status: DC

## 2019-08-19 MED ORDER — FLUTICASONE PROPIONATE 50 MCG/ACT NA SUSP: 2.0000 | Freq: Every day | NASAL | 3 refills | Status: DC

## 2019-08-19 NOTE — Assessment & Plan Note (Signed)
S: Patient has been using Flonase once daily and finds this helpful for allergies. Mainly spring and fall issues. Phlegm in cough at times much better on flonase A/P: we will send in rx for this as has been helpful

## 2019-08-19 NOTE — Patient Instructions (Addendum)
Health Maintenance Due  Topic Date Due  . OPHTHALMOLOGY EXAM - 03/2019. Team please make sure to request a copy or have desk request 12/19/2018   Try colace/docusate on top of the miralax  Blood pressure was mildly high today.  Has been well controlled on last few visits-I asked him to do some home monitoring and let me know if remains above 140/90 at home.  We will call you within two weeks about your referral to GI for constipation and pulmonology for CPAP evaluation. If you do not hear within 3 weeks, give [place] a call.   I sent in flonase for you  Please stop by lab before you go If you do not have mychart- we will call you about results within 5 business days of [place] receiving them.  If you have mychart- we will send your results within 3 business days of [place] receiving them.  If abnormal or we want to clarify a result, we will call or mychart you to make sure you receive the message.  If you have questions or concerns or don't hear within 5 business days, please send [place] a message or call us.

## 2019-08-19 NOTE — Progress Notes (Signed)
Phone [phone] In person visit   Subjective:   Walter [name] is a 79 y.o. year old very pleasant male patient who presents for/with See problem oriented charting Chief Complaint  Patient presents with  . Follow-up  . Diabetes   This visit occurred during the SARS-CoV-2 public health emergency.  Safety protocols were in place, including screening questions prior to the visit, additional usage of staff PPE, and extensive cleaning of exam room while observing appropriate contact time as indicated for disinfecting solutions.   Past Medical History-  Patient Active Problem List   Diagnosis Date Noted  . 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 BPH (benign prostatic hyperplasia) 07/02/2015    Priority: High  . Diabetes mellitus type II, controlled ([place]) 04/23/2007    Priority: High  . Hyperlipidemia associated with type 2 diabetes mellitus ([place]) 04/23/2007    Priority: Medium  . Obstructive sleep apnea 04/23/2007    Priority: Medium  . Hypertension associated with diabetes ([place]) 04/23/2007    Priority: Medium  . 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 Sepsis secondary to UTI ([place]) 03/29/2015    Priority: Low  . Hallux valgus of right foot 03/26/2014    Priority: Low  . History of colon cancer 01/27/2014    Priority: Low  . IRITIS 12/17/2008    Priority: Low  . 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 BIGEMINY 04/21/2008    Priority: Low  . Allergic rhinitis 08/19/2019  . 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 amLODipine (NORVASC) 5 MG tablet TAKE 1 TABLET BY MOUTH EVERY DAY 90 tablet 3  . aspirin 81 MG tablet Take 81 mg by mouth daily.    Marland Kitchen glimepiride (AMARYL) 4 MG tablet TAKE 2 TABLETS BY MOUTH DAILY BEFORE BREAKFAST. 180 tablet 1  .  JANUVIA 100 MG tablet TAKE 1 TABLET BY MOUTH EVERY DAY 90 tablet 1  . metFORMIN (GLUCOPHAGE) 1000 MG tablet TAKE 1 TABLET BY MOUTH 2 TIMES DAILY WITH A MEAL. 180 tablet 3  . Multiple Vitamins-Minerals (CENTRUM SILVER PO) Take 1 tablet by mouth daily.     Glory Rosebush ULTRA test strip USE ONCE DAILY AS INSTRUCTED 100 strip 3  . telmisartan (MICARDIS) 40 MG tablet TAKE 1 TABLET BY MOUTH EVERY DAY 90 tablet 3  . fluticasone (FLONASE) 50 MCG/ACT nasal spray Place 2 sprays into both nostrils daily. 16 g 3  . rosuvastatin (CRESTOR) 20 MG tablet Take 1 tablet (20 mg total) by mouth once a week. 13 tablet 3   No current facility-administered medications for this visit.     Objective:  BP 140/78   Pulse 75   Temp 97.6 F (36.4 C)   Ht 6\' 1"  (1.854 m)   Wt 199 lb 9.6 oz (90.5 kg)   SpO2 96%   BMI 26.33 kg/m  Gen: NAD, resting comfortably CV: RRR no murmurs rubs or gallops Lungs: CTAB no crackles, wheeze, rhonchi Abdomen: soft/nontender/nondistended/normal bowel sounds. No rebound or guarding.  Ext: trace edema Skin: warm, dry  Neuro: CN II-XII intact, sensation and reflexes normal throughout, 5/5 muscle strength in bilateral upper and lower extremities. Normal finger to nose. Normal rapid alternating movements. No pronator drift. Normal romberg. Normal gait.       Assessment and Plan   #  Memory loss/word finding difficulty S: Patient complains of intermittent issues with finding words.  He has not gotten lost driving.  He has noted it as well as his wife.  Reports this has been going on for at least a few years and slightly worsening. No issues with long term memory.  A/P: 79 year old male with reported word finding difficulties/memory changes over the last 2 years that seems to be worsening.  Mini-Mental status exam today scored 25 out of 30 (couldn't remember name of our practice or the county, lost 2 for 3 word recall, missed interlocking pentagons).  We will investigate for potential causes  with labs today.  Neurological exam is reassuring today. Mom had alzheimers so this weighs on him.  - declines HIV and syphilis testing-has been monogamous for 54 years.   -If no obvious cause on labs we agreed to do a neurology visit - also has some fatigue but does wear his cpap. No chest pain or shortness of breath. CPap originally set up by Dr. Arnoldo Morale- will place pulmonology expert consult -They have some questions about fit of mask and function.   #hypertension S: compliant with Amlodipine 5mg  and Telmisartan 40mg  BP Readings from Last 3 Encounters:  08/19/19 140/78  03/11/19 130/76  02/25/19 138/82  A/P: Blood pressure was mildly high today.  Has been well controlled on last few visits-I asked him to do some home monitoring and let me know if remains above 140/90 at home.  We were having some tough discussions today about memory changes and I think that may have contributed to the elevation.  # Diabetes S: Compliant with Januvia 100 mg, Metformin 1000 mg twice daily CBGs-  152 this AM- feels like going up some as not walking as frquently. No low blood sugar . #s look much better when he is active in the yard.  Walking Exercise and diet- congratulated patient on 5 pound weight loss from last visit  Lab Results  Component Value Date   HGBA1C 6.9 (H) 02/25/2019   HGBA1C 7.2 (H) 10/24/2018   HGBA1C 7.3 (A) 03/26/2018   A/P: Hopefully diabetes remains controlled-continue Januvia 100 mg and Metformin 1000 mg twice daily for now.  Update A1c with labs today.  I also wonder if B12 could be low with ongoing Metformin use. -He would prefer increasing exercise/improving diet instead of increasing medicine if numbers are high  today  #hyperlipidemia S: compliant with atorvastatin 20 mg once a week Lab Results  Component Value Date   CHOL 143 02/25/2019   HDL 37.70 (L) 02/25/2019   LDLCALC 79 02/25/2019   LDLDIRECT 90.0 09/14/2017   TRIG 133.0 02/25/2019   CHOLHDL 4 02/25/2019   A/P:  Close to ideal control with bad cholesterol at 79 and ideal goal under 70.  Atorvastatin has slightly more associations with memory changes and with wife reporting for memory concern-we agreed to change to rosuvastatin 20 mg once a week-this may also get him closer to his goal of 15 or less   #Allergic rhinitis S: Patient has been using Flonase once daily and finds this helpful for allergies. Mainly spring and fall issues. Phlegm in cough at times much better on flonase A/P: we will send in rx for this as has been helpful   #Constipation S:Patient is up-to-date on colonoscopy with last colonoscopy November 13, 2014 though he is due later this year for 5-year repeat.  He complains of constipation. He has been using miralax for a # of years but stool still  getting harder- first plug and then it loosens up.   Stays well hydrated. Also eats several prunes and tries prune juice A/P: add colace. Refer to GI with worsening constipatoin issues   #Left toe surgery-patient had postponed this to May as wants to help care for his wife. He ultimately has decided not to have this done  Recommended follow up: Return in about 6 months (around 02/18/2020) for follow up- or sooner if needed.  Lab/Order associations:   ICD-10-CM   1. Controlled type 2 diabetes mellitus without complication, without long-term current use of insulin (HCC)  E11.9 Comprehensive metabolic panel    Hemoglobin A1c  2. Hypertension associated with diabetes ([place])  E11.59    I10   3. Hyperlipidemia associated with type 2 diabetes mellitus ([place])  E11.69    E78.5   4. Memory loss  R41.3 Comprehensive metabolic panel    Hemoglobin A1c    TSH    CBC    Vitamin B12  5. Seasonal allergic rhinitis due to pollen  J30.1   6. Constipation, unspecified constipation type  K59.00 Ambulatory referral to Gastroenterology  7. Screen for colon cancer  Z12.11 Ambulatory referral to Gastroenterology  8. Obstructive sleep apnea  G47.33 Ambulatory  referral to Pulmonology  9. Hx of amputation of lesser toe, right (HCC) Chronic- noted stable- he decided against toe surgery Z89.421     Meds ordered this encounter  Medications  . rosuvastatin (CRESTOR) 20 MG tablet    Sig: Take 1 tablet (20 mg total) by mouth once a week.    Dispense:  13 tablet    Refill:  3  . fluticasone (FLONASE) 50 MCG/ACT nasal spray    Sig: Place 2 sprays into both nostrils daily.    Dispense:  16 g    Refill:  3   Return precautions advised.  [name], MD

## 2019-08-20 ENCOUNTER — Other Ambulatory Visit: Payer: Self-pay

## 2019-08-20 ENCOUNTER — Encounter: Payer: Self-pay | Admitting: Neurology

## 2019-08-20 DIAGNOSIS — R413 Other amnesia: Secondary | ICD-10-CM

## 2019-08-22 ENCOUNTER — Encounter: Payer: Self-pay | Admitting: Family Medicine

## 2019-09-23 ENCOUNTER — Encounter: Payer: Self-pay | Admitting: Gastroenterology

## 2019-09-23 ENCOUNTER — Ambulatory Visit: Payer: Medicare Other | Admitting: Gastroenterology

## 2019-09-23 VITALS — BP 136/84 | HR 81 | Ht 73.0 in | Wt 198.0 lb

## 2019-09-23 DIAGNOSIS — Z85038 Personal history of other malignant neoplasm of large intestine: Secondary | ICD-10-CM

## 2019-09-23 DIAGNOSIS — K5900 Constipation, unspecified: Secondary | ICD-10-CM

## 2019-09-23 MED ORDER — SUTAB 1479-225-188 MG PO TABS: 1.0000 | ORAL_TABLET | ORAL | 0 refills | Status: DC

## 2019-09-23 NOTE — Progress Notes (Signed)
History of Present Illness: This is a 79 year old male referred by [name], MD for the evaluation of hard stools, worsening constipation.  Patient relates slight increase in constipation over the past several weeks.  He had been taking MiraLAX daily with a daily bowel movement without difficulty for long period of time.  Despite continuing this regimen he is noted frequent problems with straining and harder stools.  A stool softener was added and his symptoms have improved. Denies weight loss, abdominal pain, diarrhea, change in stool caliber, melena, hematochezia, nausea, vomiting, dysphagia, reflux symptoms, chest pain.   Colonoscopy 10/2014 1. Mild diverticulosis in the descending colon 2. Prior colo-colonic surgical anastomosis in the sigmoid colon 3. Grade l internal hemorrhoids and hypertrophied anal papillae   Allergies  Allergen Reactions  . Bactrim [Sulfamethoxazole-Trimethoprim] Nausea And Vomiting    Pt has chills and fevers also.   Outpatient Medications Prior to Visit  Medication Sig Dispense Refill  . amLODipine (NORVASC) 5 MG tablet TAKE 1 TABLET BY MOUTH EVERY DAY 90 tablet 3  . aspirin 81 MG tablet Take 81 mg by mouth daily.    . fluticasone (FLONASE) 50 MCG/ACT nasal spray Place 2 sprays into both nostrils daily. 16 g 3  . glimepiride (AMARYL) 4 MG tablet TAKE 2 TABLETS BY MOUTH DAILY BEFORE BREAKFAST. 180 tablet 1  . JANUVIA 100 MG tablet TAKE 1 TABLET BY MOUTH EVERY DAY 90 tablet 1  . metFORMIN (GLUCOPHAGE) 1000 MG tablet TAKE 1 TABLET BY MOUTH 2 TIMES DAILY WITH A MEAL. 180 tablet 3  . Multiple Vitamins-Minerals (CENTRUM SILVER PO) Take 1 tablet by mouth daily.     Glory Rosebush ULTRA test strip USE ONCE DAILY AS INSTRUCTED 100 strip 3  . rosuvastatin (CRESTOR) 20 MG tablet Take 1 tablet (20 mg total) by mouth once a week. 13 tablet 3  . telmisartan (MICARDIS) 40 MG tablet TAKE 1 TABLET BY MOUTH EVERY DAY 90 tablet 3   No facility-administered medications  prior to visit.   Past Medical History:  Diagnosis Date  . Colon cancer ([place]) 1992   colon resection 1992  . Complication of anesthesia    bladder problems after surgery from Anesthesia- can not urinate  . Diabetes mellitus    type 2  . History of shingles   . Hyperlipidemia   . Hypertension   . Osteomyelitis ([place])    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  . Sleep apnea    uses CPAP nightly  . Tubular adenoma of colon 10/1999   Past Surgical History:  Procedure Laterality Date  . AMPUTATION TOE Right 09/14/2016   Procedure: RIGHT 2ND TOE/RAY AMPUTATION;  Surgeon: Leandrew Koyanagi, MD;  Location: [facility];  Service: Orthopedics;  Laterality: Right;  . APPENDECTOMY  1949  . COLECTOMY  1992  . CYSTOSCOPY N/A 06/21/2015   Procedure: CYSTOSCOPY, CLOT EVACUATION, AND CAUTERIZATION OF PROSTATE FOSSA;  Surgeon: Carolan Clines, MD;  Location[facility];  Service: Urology;  Laterality: N/A;  . HEMORRHOID SURGERY    . TRANSURETHRAL RESECTION OF PROSTATE N/A 05/23/2015   Procedure: TRANSURETHRAL RESECTION OF THE PROSTATE (TURP);  Surgeon: Irine Seal, MD;  [facility];  Service: Urology;  Laterality: N/A;   Social History   Socioeconomic History  . Marital status: Married    Spouse name: Not on file  . Number of children: Not on file  . Years of education: Not on file  . Highest education level: Not on file  Occupational History  .  Not on file  Tobacco Use  . Smoking status: Never Smoker  . Smokeless tobacco: Never Used  . Tobacco comment: smoked for less that 6 months in 1960  Substance and Sexual Activity  . Alcohol use: Yes    Alcohol/week: 2.0 standard drinks    Types: 2 Cans of beer per week    Comment: social  . Drug use: No  . Sexual activity: Not on file  Other Topics Concern  . Not on file  Social History Narrative   Married 1966. 2 daughters Sharee Pimple divorced lives in [place] works for [employer] no kids and Mateo Flow never married in [place], [place] working for [employer] for [employer]  improvement no kids.       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 Difficulty of Paying Living Expenses:   Food Insecurity:   . Worried About Charity fundraiser in the Last Year:   . Arboriculturist in the Last Year:   Transportation Needs:   . Film/video editor (Medical):   Marland Kitchen Lack of Transportation (Non-Medical):   Physical Activity:   . Days of Exercise per Week:   . Minutes of Exercise per Session:   Stress:   . Feeling of Stress :   Social Connections:   . Frequency of Communication with Friends and Family:   . Frequency of Social Gatherings with Friends and Family:   . Attends Religious Services:   . Active Member of Clubs or Organizations:   . Attends Archivist Meetings:   Marland Kitchen Marital Status:    Family History  Problem Relation Age of Onset  . Colon cancer Father 33  . Diabetes Father        type I  . Alzheimer's disease Mother   . Cancer Other        colon/fhx  . Rectal cancer Maternal Uncle   . Colon cancer Paternal Grandmother 60      Review of Systems: Pertinent positive and negative review of systems were noted in the above HPI section. All other review of systems were otherwise negative.   Physical Exam: General: Well developed, well nourished, no acute distress Head: Normocephalic and atraumatic Eyes:  sclerae anicteric, EOMI Ears: Normal auditory acuity Mouth: Not examined, mask on during Covid-19 pandemic Neck: Supple, no masses or thyromegaly Lungs: Clear throughout to auscultation Heart: Regular rate and rhythm; no murmurs, rubs or bruits Abdomen: Soft, non tender and non distended. No masses, hepatosplenomegaly or hernias noted. Normal Bowel sounds Rectal: Deferred to colonoscopy Musculoskeletal: Symmetrical with no  gross deformities  Skin: No lesions on visible extremities Pulses:  Normal pulses noted Extremities: No clubbing, cyanosis, edema or deformities noted Neurological: Alert oriented x 4, grossly nonfocal Cervical Nodes:  No significant cervical adenopathy Inguinal Nodes: No significant inguinal adenopathy Psychological:  Alert and cooperative. Normal mood and affect   Assessment and Recommendations:  1. Constipation.  Continue MiraLAX once daily and daily stool softener.  Encouraged adequate daily water and fiber intake.  2.  Personal history of colon cancer.  Status post sigmoid colectomy.  He he is due for surveillance colonoscopy.  Schedule colonoscopy. The risks (including bleeding, perforation, infection, missed lesions, medication reactions and possible hospitalization or surgery if complications occur), benefits, and alternatives to colonoscopy  with possible biopsy and possible polypectomy were discussed with the patient and they consent to proceed.     cc: [name], [address] [place],  [place] [address]

## 2019-09-23 NOTE — Patient Instructions (Signed)
You have been scheduled for a colonoscopy. Please follow written instructions given to you at your visit today.  Please pick up your prep supplies at the pharmacy within the next 1-3 days. If you use inhalers (even only as needed), please bring them with you on the day of your procedure.  Thank you for choosing me and [facility].  Malcolm T. Stark, Jr., MD., FACG  

## 2019-10-11 ENCOUNTER — Other Ambulatory Visit: Payer: Self-pay | Admitting: Family Medicine

## 2019-10-14 ENCOUNTER — Other Ambulatory Visit: Payer: Self-pay | Admitting: Family Medicine

## 2019-10-17 ENCOUNTER — Encounter: Payer: Self-pay | Admitting: Pulmonary Disease

## 2019-10-17 ENCOUNTER — Other Ambulatory Visit: Payer: Self-pay

## 2019-10-17 ENCOUNTER — Ambulatory Visit: Payer: Medicare Other | Admitting: Pulmonary Disease

## 2019-10-17 VITALS — BP 128/64 | HR 75 | Temp 97.6°F | Ht 72.0 in | Wt 199.0 lb

## 2019-10-17 DIAGNOSIS — G4733 Obstructive sleep apnea (adult) (pediatric): Secondary | ICD-10-CM

## 2019-10-17 NOTE — Progress Notes (Signed)
Walter [name]    [id number]    Mar 03, 1941  Primary Care Physician:Hunter, [name], MD  Referring Physician: [name], [place],  [place] [address]  Chief complaint:   Patient with a history of obstructive sleep apnea  HPI:  Obstructive sleep apnea was diagnosed over 20 years ago Recently spouse has noticed what appears to be oral venting  Abnormal sounds and then gurgling sounds Patient is using a nose mask He has tried full facemask in the past and was not able to tolerate them He is not willing to try them now as he feels he cannot sleep with a full facemask  Has had sleep apnea for 20 years Machine is about 79 years old  Uses machine nightly, uses machine whenever he is taking a nap  Usually goes to bed about 11 PM, falls asleep immediately Final wake up time 6:30 AM  Continues to benefit from using CPAP on a regular basis  He has hypertension, diabetes Was treated for colon cancer 89  He feels well  Spouse is concerned that his sleep apnea is possibly suboptimally treated at present  Outpatient Encounter Medications as of 10/17/2019  Medication Sig  . amLODipine (NORVASC) 5 MG tablet TAKE 1 TABLET BY MOUTH EVERY DAY  . aspirin 81 MG tablet Take 81 mg by mouth daily.  . fluticasone (FLONASE) 50 MCG/ACT nasal spray SPRAY 2 SPRAYS INTO EACH NOSTRIL EVERY DAY  . glimepiride (AMARYL) 4 MG tablet TAKE 2 TABLETS BY MOUTH DAILY BEFORE BREAKFAST.  Marland Kitchen JANUVIA 100 MG tablet TAKE 1 TABLET BY MOUTH EVERY DAY  . metFORMIN (GLUCOPHAGE) 1000 MG tablet TAKE 1 TABLET BY MOUTH 2 TIMES DAILY WITH A MEAL.  . Multiple Vitamins-Minerals (CENTRUM SILVER PO) Take 1 tablet by mouth daily.   Glory Rosebush ULTRA test strip USE ONCE DAILY AS INSTRUCTED  . rosuvastatin (CRESTOR) 20 MG tablet Take 1 tablet (20 mg total) by mouth once a week.  . Sodium Sulfate-Mag Sulfate-KCl (SUTAB) [phone] MG TABS Take 1 kit by mouth as directed.  . telmisartan  (MICARDIS) 40 MG tablet TAKE 1 TABLET BY MOUTH EVERY DAY   No facility-administered encounter medications on file as of 10/17/2019.    Allergies as of 10/17/2019 - Review Complete 10/17/2019  Allergen Reaction Noted  . Bactrim [sulfamethoxazole-trimethoprim] Nausea And Vomiting 01/11/2016    Past Medical History:  Diagnosis Date  . Colon cancer ([place]) 1992   colon resection 1992  . Complication of anesthesia    bladder problems after surgery from Anesthesia- can not urinate  . Diabetes mellitus    type 2  . History of shingles   . Hyperlipidemia   . Hypertension   . Osteomyelitis ([place])    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  . Sleep apnea    uses CPAP nightly  . Tubular adenoma of colon 10/1999    Past Surgical History:  Procedure Laterality Date  . AMPUTATION TOE Right 09/14/2016   Procedure: RIGHT 2ND TOE/RAY AMPUTATION;  Surgeon: Leandrew Koyanagi, MD;  Location: [facility];  Service: Orthopedics;  Laterality: Right;  . APPENDECTOMY  1949  . COLECTOMY  1992  . CYSTOSCOPY N/A 06/21/2015   Procedure: CYSTOSCOPY, CLOT EVACUATION, AND CAUTERIZATION OF PROSTATE FOSSA;  Surgeon: Carolan Clines, MD;  [facility];  Service: Urology;  Laterality: N/A;  . HEMORRHOID SURGERY    . TRANSURETHRAL RESECTION OF PROSTATE N/A 05/23/2015   Procedure: TRANSURETHRAL RESECTION OF THE PROSTATE (  TURP);  Surgeon: Irine Seal, MD;  [facility];  Service: Urology;  Laterality: N/A;    Family History  Problem Relation Age of Onset  . Colon cancer Father 28  . Diabetes Father        type I  . Alzheimer's disease Mother   . Cancer Other        colon/fhx  . Rectal cancer Maternal Uncle   . Colon cancer Paternal Grandmother 65    Social History   Socioeconomic History  . Marital status: Married    Spouse name: Not on file  . Number of children: Not on file  . Years of education: Not on file  . Highest education level: Not on file  Occupational History  . Not on file  Tobacco Use  .  Smoking status: Former Smoker    Packs/day: 0.25    Years: 10.00    Pack years: 2.50    Types: Pipe  . Smokeless tobacco: Never Used  Vaping Use  . Vaping Use: Never used  Substance and Sexual Activity  . Alcohol use: Yes    Alcohol/week: 2.0 standard drinks    Types: 2 Cans of beer per week    Comment: social  . Drug use: No  . Sexual activity: Not on file  Other Topics Concern  . Not on file  Social History Narrative   Married 1966. 2 daughters Sharee Pimple divorced lives in [place] works for [employer] no kids and Mateo Flow never married in [place], [place] working for [employer] for [employer] improvement no kids.       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 Difficulty of Paying Living Expenses:   Food Insecurity:   . Worried About Charity fundraiser in the Last Year:   . Arboriculturist in the Last Year:   Transportation Needs:   . Film/video editor (Medical):   Marland Kitchen Lack of Transportation (Non-Medical):   Physical Activity:   . Days of Exercise per Week:   . Minutes of Exercise per Session:   Stress:   . Feeling of Stress :   Social Connections:   . Frequency of Communication with Friends and Family:   . Frequency of Social Gatherings with Friends and Family:   . Attends Religious Services:   . Active Member of Clubs or Organizations:   . Attends Archivist Meetings:   Marland Kitchen Marital Status:   Intimate Partner Violence:   . Fear of Current or Ex-Partner:   . Emotionally Abused:   Marland Kitchen Physically Abused:   . Sexually Abused:     Review of Systems  Respiratory: Positive for apnea.   Psychiatric/Behavioral: Positive for sleep disturbance.  All other systems reviewed and are negative.   Vitals:   10/17/19 1059  BP: 128/64  Pulse: 75  Temp: 97.6 F (36.4  C)  SpO2: 95%     Physical Exam HENT:     Head: Normocephalic.     Mouth/Throat:     Mouth: Mucous membranes are moist.     Comments: Mallampati 3, crowded oropharynx Eyes:     General:        Right eye: No discharge.        Left eye: No discharge.  Cardiovascular:  Rate and Rhythm: Normal rate and regular rhythm.     Pulses: Normal pulses.     Heart sounds: Normal heart sounds. No murmur heard.  No friction rub.  Pulmonary:     Effort: Pulmonary effort is normal. No respiratory distress.     Breath sounds: Normal breath sounds. No stridor. No wheezing or rhonchi.  Musculoskeletal:     Cervical back: No rigidity or tenderness.  Neurological:     Mental Status: He is alert.   Assessment:  History of obstructive sleep apnea-compliant with CPAP use  Likely oral venting at night  Machine is dated and may need upgrading, pressure requirement may also be significantly different from what he is using now, he is on a pressure of 7 Last study was many years ago Last upgrade to his machine was 12 years ago  Plan/Recommendations: Will send the prescription to [facility] for a chinstrap  Will schedule the patient for split-night study  He continues to be very compliant with CPAP use and feels CPAP is definitely helping greatly He cannot sleep without his CPAP  Follow-up in 2 to 3 months  Sherrilyn Rist MD [facility] 10/17/2019, 11:14 AM  CC: [name], MD

## 2019-10-17 NOTE — Patient Instructions (Signed)
We will contact [facility] with a prescription for chinstrap  Split-night study The goal is to upgrade your machine, ensure pressure is set right and also figure out what type of mask will work best for you  Remember to take your current mask with you when you go for the study  I will see you back in the office in about 2 to 3 months  Call with significant concerns

## 2019-11-03 NOTE — Progress Notes (Signed)
Subjective:  Patient ID: Walter [name], male    DOB: 10-26-1940,  MRN: [id number]  Chief Complaint  Patient presents with  . Follow-up    hallux valgus, right - wants to discuss surgery    79 y.o. male presents with the above complaint.  Here for further discussion of surgery.  Still has the ulcer but it is remained rather small.   Review of Systems: Negative except as noted in the HPI. Denies N/V/F/Ch.  Past Medical History:  Diagnosis Date  . Colon cancer ([place]) 1992   colon resection 1992  . Complication of anesthesia    bladder problems after surgery from Anesthesia- can not urinate  . Diabetes mellitus    type 2  . History of shingles   . Hyperlipidemia   . Hypertension   . Osteomyelitis ([place])    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  . Sleep apnea    uses CPAP nightly  . Tubular adenoma of colon 10/1999    Current Outpatient Medications:  .  aspirin 81 MG tablet, Take 81 mg by mouth daily., Disp: , Rfl:  .  metFORMIN (GLUCOPHAGE) 1000 MG tablet, TAKE 1 TABLET BY MOUTH 2 TIMES DAILY WITH A MEAL., Disp: 180 tablet, Rfl: 3 .  Multiple Vitamins-Minerals (CENTRUM SILVER PO), Take 1 tablet by mouth daily. , Disp: , Rfl:  .  amLODipine (NORVASC) 5 MG tablet, TAKE 1 TABLET BY MOUTH EVERY DAY, Disp: 90 tablet, Rfl: 3 .  fluticasone (FLONASE) 50 MCG/ACT nasal spray, SPRAY 2 SPRAYS INTO EACH NOSTRIL EVERY DAY, Disp: 48 mL, Rfl: 1 .  glimepiride (AMARYL) 4 MG tablet, TAKE 2 TABLETS BY MOUTH DAILY BEFORE BREAKFAST., Disp: 180 tablet, Rfl: 1 .  JANUVIA 100 MG tablet, TAKE 1 TABLET BY MOUTH EVERY DAY, Disp: 90 tablet, Rfl: 1 .  ONETOUCH ULTRA test strip, USE ONCE DAILY AS INSTRUCTED, Disp: 100 strip, Rfl: 3 .  rosuvastatin (CRESTOR) 20 MG tablet, Take 1 tablet (20 mg total) by mouth once a week., Disp: 13 tablet, Rfl: 3 .  Sodium Sulfate-Mag Sulfate-KCl (SUTAB) [phone] MG TABS, Take 1 kit by mouth as directed., Disp: 24 tablet, Rfl: 0 .  telmisartan (MICARDIS) 40 MG tablet, TAKE 1 TABLET BY MOUTH EVERY  DAY, Disp: 90 tablet, Rfl: 3  Social History   Tobacco Use  Smoking Status Former Smoker  . Packs/day: 0.25  . Years: 10.00  . Pack years: 2.50  . Types: Pipe  Smokeless Tobacco Never Used    Allergies  Allergen Reactions  . Bactrim [Sulfamethoxazole-Trimethoprim] Nausea And Vomiting    Pt has chills and fevers also.   Objective:   There were no vitals filed for this visit. There is no height or weight on file to calculate BMI. Constitutional Well developed. Well nourished.  Vascular Dorsalis pedis pulses palpable bilaterally. Posterior tibial pulses palpable bilaterally. Capillary refill normal to all digits.  No cyanosis or clubbing noted. Pedal hair growth normal.  Neurologic Normal speech. Oriented to person, place, and time. Epicritic sensation to light touch grossly present bilaterally.  Dermatologic Nails well groomed and normal in appearance. Superficial hallux IPJ ulcer right measuring 0.2 x 0.2 post debridement without warmth erythema or signs of acute infection  Orthopedic: Normal joint ROM without pain or crepitus bilaterally. No bony tenderness. 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  Varus angulation of the great toe Assessment:   1. Hallux valgus, right   2. Hx  of amputation of lesser toe, right ([place])   3. Ulcer of great toe, right, limited to breakdown of skin ([place])   4. Joint derangement of ankle or foot    Plan:  Patient was evaluated and treated and all questions answered.  Severe HAV right -We discussed surgical options for cure of the ulceration.  All questions were addressed. -Patient has failed all conservative therapy and wishes to proceed with surgical intervention. All risks, benefits, and alternatives discussed with patient. No guarantees given. Consent reviewed and signed by patient. -Planned procedures: Fusion of  the first metatarsophalangeal joint right, excision of ulcer  No follow-ups on file.

## 2019-11-06 ENCOUNTER — Other Ambulatory Visit (HOSPITAL_COMMUNITY): Payer: Medicare Other | Attending: Pulmonary Disease

## 2019-11-08 ENCOUNTER — Ambulatory Visit (HOSPITAL_BASED_OUTPATIENT_CLINIC_OR_DEPARTMENT_OTHER): Payer: Medicare Other | Attending: Pulmonary Disease | Admitting: Pulmonary Disease

## 2019-11-08 ENCOUNTER — Other Ambulatory Visit: Payer: Self-pay

## 2019-11-08 DIAGNOSIS — G4733 Obstructive sleep apnea (adult) (pediatric): Secondary | ICD-10-CM | POA: Insufficient documentation

## 2019-11-08 DIAGNOSIS — R0902 Hypoxemia: Secondary | ICD-10-CM | POA: Diagnosis not present

## 2019-11-08 DIAGNOSIS — I493 Ventricular premature depolarization: Secondary | ICD-10-CM | POA: Diagnosis not present

## 2019-11-11 ENCOUNTER — Other Ambulatory Visit: Payer: Self-pay

## 2019-11-12 ENCOUNTER — Telehealth: Payer: Self-pay | Admitting: Pulmonary Disease

## 2019-11-12 ENCOUNTER — Encounter: Payer: Self-pay | Admitting: Gastroenterology

## 2019-11-12 DIAGNOSIS — G4733 Obstructive sleep apnea (adult) (pediatric): Secondary | ICD-10-CM

## 2019-11-12 NOTE — Telephone Encounter (Signed)
Attempted to call patient, will attempt later today.

## 2019-11-12 NOTE — Procedures (Signed)
POLYSOMNOGRAPHY  Last, FirstLeny, [name] MRN: [id number] Gender: Male Age (years): 86 Weight (lbs): 195 DOB: 01/19/1941 BMI: 28 Primary Care: No PCP Epworth Score: 6 Referring: Laurin Coder MD Technician: [place] Flattery Interpreting: Laurin Coder MD Study Type: NPSG Ordered Study Type: Split Night CPAP Study date: 11/08/2019 Location: [facility] CLINICAL INFORMATION Walter [name] is a 79 year old Male and was referred to the sleep center for evaluation of G47.80 Other Sleep Disorders. Indications include Parasomnias, Snoring.  MEDICATIONS Patient self administered medications include: N/A. Medications administered during study include No sleep medicine administered.  SLEEP STUDY TECHNIQUE A multi-channel overnight Polysomnography study was performed. The channels recorded and monitored were central and occipital EEG, electrooculogram (EOG), submentalis EMG (chin), nasal and oral airflow, thoracic and abdominal wall motion, anterior tibialis EMG, snore microphone, electrocardiogram, and a pulse oximetry. TECHNICIAN COMMENTS Comments added by Technician: ESON NASAL MASK WAS FITTED FOR THIS STUDY Comments added by Scorer: N/A SLEEP ARCHITECTURE The study was initiated at 10:15:48 PM and terminated at 4:46:25 AM. The total recorded time was 390.6 minutes. EEG confirmed total sleep time was 306 minutes yielding a sleep efficiency of 78.3%%. Sleep onset after lights out was 10.0 minutes with a REM latency of 137.0 minutes. The patient spent 14.1%% of the night in stage N1 sleep, 68.8%% in stage N2 sleep, 0.0%% in stage N3 and 17.2% in REM. Wake after sleep onset (WASO) was 74.6 minutes. The Arousal Index was 3.3/hour. RESPIRATORY PARAMETERS There were a total of 59 respiratory disturbances out of which 41 were apneas ( 41 obstructive, 0 mixed, 0 central) and 18 hypopneas. The apnea/hypopnea index (AHI) was 11.6 events/hour. The central sleep apnea index was 0.0 events/hour. The REM AHI  was 43.4 events/hour and NREM AHI was 5.0 events/hour. The supine AHI was 11.6 events/hour and the non supine AHI was 0 supine during 100.00% of sleep. Respiratory disturbances were associated with oxygen desaturation down to a nadir of 86.0% during sleep. The mean oxygen saturation during the study was 91.8%. The cumulative time under 88% oxygen saturation was 5.5 minutes.  LEG MOVEMENT DATA The total leg movements were 0 with a resulting leg movement index of 0.0/hr .Associated arousal with leg movement index was 0.0/hr.  CARDIAC DATA The underlying cardiac rhythm was most consistent with sinus rhythm. Mean heart rate during sleep was 66.2 bpm. Additional rhythm abnormalities include PVCs.   IMPRESSIONS - Mild Obstructive Sleep apnea(OSA) - Electrocardiographic data showed presence of PVCs. - Mild Oxygen Desaturation - The patient snored with loud snoring volume. - No significant periodic leg movements(PLMs) during sleep. However, no significant associated arousals.   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 - Auto titrating CPAP with a pressure settings of 5 to 15 will be appropriate - Positional therapy avoiding supine position during sleep. - Avoid alcohol, sedatives and other CNS depressants that may worsen sleep apnea and disrupt normal sleep architecture. - Sleep hygiene should be reviewed to assess factors that may improve sleep quality. - Weight management and regular exercise should be initiated or continued.  [Electronically signed] 11/12/2019 04:51 AM  Sherrilyn Rist MD NPI: [id number]

## 2019-11-12 NOTE — Telephone Encounter (Signed)
Call patient  Sleep study result  Date of study: 11/08/2019  Impression: Mild obstructive sleep apnea Mild oxygen desaturation  Recommendation: Auto titrating CPAP with pressure settings of 5-15 will be appropriate  Therapeutic CPAP titration to determine optimal pressure to alleviate sleep disordered breathing will be an option of treatment  Patient's mask of choice  From the office visit-nose mask will likely require use with a chinstrap to avoid oral venting-air leaking from the mouth while sleeping.  A full facemask will be an option

## 2019-11-12 NOTE — Telephone Encounter (Signed)
Patient contacted with results of home sleep study, new DME order placed for new machine. Patient machine is about 79 years old, chin strap was previously ordered, follow up recall placed for 2 months.

## 2019-11-17 ENCOUNTER — Encounter: Payer: Self-pay | Admitting: Certified Registered Nurse Anesthetist

## 2019-11-18 ENCOUNTER — Ambulatory Visit (AMBULATORY_SURGERY_CENTER): Payer: Medicare Other | Admitting: Gastroenterology

## 2019-11-18 ENCOUNTER — Other Ambulatory Visit: Payer: Self-pay

## 2019-11-18 ENCOUNTER — Encounter: Payer: Self-pay | Admitting: Gastroenterology

## 2019-11-18 VITALS — BP 110/68 | HR 63 | Temp 96.6°F | Resp 12 | Ht 73.0 in | Wt 198.0 lb

## 2019-11-18 DIAGNOSIS — R194 Change in bowel habit: Secondary | ICD-10-CM | POA: Diagnosis not present

## 2019-11-18 DIAGNOSIS — D123 Benign neoplasm of transverse colon: Secondary | ICD-10-CM

## 2019-11-18 DIAGNOSIS — D125 Benign neoplasm of sigmoid colon: Secondary | ICD-10-CM

## 2019-11-18 DIAGNOSIS — Z85038 Personal history of other malignant neoplasm of large intestine: Secondary | ICD-10-CM | POA: Diagnosis not present

## 2019-11-18 DIAGNOSIS — K5900 Constipation, unspecified: Secondary | ICD-10-CM

## 2019-11-18 MED ORDER — SODIUM CHLORIDE 0.9 % IV SOLN: 500.0000 mL | Freq: Once | INTRAVENOUS | Status: DC

## 2019-11-18 NOTE — Op Note (Signed)
[facility] Patient Name: Walter [name] Procedure Date: 11/18/2019 1:31 PM MRN: [id number] Endoscopist: Ladene Artist , MD Age: 79 Referring MD:  Date of Birth: 1940/10/07 Gender: Male Account #: [id number] Procedure:                Colonoscopy Indications:              Change in bowel habits, Constipation, Personal                            history of colon cancer. Medicines:                Monitored Anesthesia Care Procedure:                Pre-Anesthesia Assessment:                           - Prior to the procedure, a History and Physical                            was performed, and patient medications and                            allergies were reviewed. The patient's tolerance of                            previous anesthesia was also reviewed. The risks                            and benefits of the procedure and the sedation                            options and risks were discussed with the patient.                            All questions were answered, and informed consent                            was obtained. Prior Anticoagulants: The patient has                            taken no previous anticoagulant or antiplatelet                            agents. ASA Grade Assessment: II - A patient with                            mild systemic disease. After reviewing the risks                            and benefits, the patient was deemed in                            satisfactory condition to undergo the procedure.  After obtaining informed consent, the colonoscope                            was passed under direct vision. Throughout the                            procedure, the patient's blood pressure, pulse, and                            oxygen saturations were monitored continuously. The                            Colonoscope was introduced through the anus and                            advanced to the the cecum, identified by                             appendiceal orifice and ileocecal valve. The                            ileocecal valve, appendiceal orifice, and rectum                            were photographed. The quality of the bowel                            preparation was good. The colonoscopy was performed                            without difficulty. The patient tolerated the                            procedure well. Scope In: 1:40:45 PM Scope Out: 2:01:53 PM Scope Withdrawal Time: 0 hours 18 minutes 21 seconds  Total Procedure Duration: 0 hours 21 minutes 8 seconds  Findings:                 The perianal and digital rectal examinations were                            normal.                           Two sessile polyps were found in the sigmoid colon                            and transverse colon. The polyps were 6 mm in size.                            These polyps were removed with a cold snare.                            Resection and retrieval were complete.  There was evidence of a prior end-to-end                            colo-colonic anastomosis in the sigmoid colon. This                            was patent and was characterized by healthy                            appearing mucosa. The anastomosis was traversed.                           Internal hemorrhoids were found during                            retroflexion. The hemorrhoids were small and Grade                            I (internal hemorrhoids that do not prolapse).                           The exam was otherwise without abnormality on                            direct and retroflexion views. Complications:            No immediate complications. Estimated blood loss:                            None. Estimated Blood Loss:     Estimated blood loss: none. Impression:               - Two 6 mm polyps in the sigmoid colon and in the                            transverse colon, removed with a cold snare.                             Resected and retrieved.                           - Patent end-to-end colo-colonic anastomosis,                            characterized by healthy appearing mucosa.                           - Internal hemorrhoids.                           - The examination was otherwise normal on direct                            and retroflexion views. Recommendation:           - Patient has a contact number available for  emergencies. The signs and symptoms of potential                            delayed complications were discussed with the                            patient. Return to normal activities tomorrow.                            Written discharge instructions were provided to the                            patient.                           - Resume previous diet.                           - Continue present medications.                           - Await pathology results.                           - No repeat colonoscopy due to age. Ladene Artist, MD 11/18/2019 2:06:28 PM This report has been signed electronically.

## 2019-11-18 NOTE — Progress Notes (Signed)
V/S-CW  Check-in-JB

## 2019-11-18 NOTE — Patient Instructions (Signed)
HANDOUTS PROVIDED ON: POLYPS & HEMORRHOIDS  The polyps removed today have been sent for pathology.  The results can take 1-3 weeks to receive.    You may resume your previous diet and medication schedule.  Thank you for allowing [place] to care for you today!!!   YOU HAD AN ENDOSCOPIC PROCEDURE TODAY AT [facility]:   Refer to the procedure report that was given to you for any specific questions about what was found during the examination.  If the procedure report does not answer your questions, please call your gastroenterologist to clarify.  If you requested that your care partner not be given the details of your procedure findings, then the procedure report has been included in a sealed envelope for you to review at your convenience later.  YOU SHOULD EXPECT: Some feelings of bloating in the abdomen. Passage of more gas than usual.  Walking can help get rid of the air that was put into your GI tract during the procedure and reduce the bloating. If you had a lower endoscopy (such as a colonoscopy or flexible sigmoidoscopy) you may notice spotting of blood in your stool or on the toilet paper. If you underwent a bowel prep for your procedure, you may not have a normal bowel movement for a few days.  Please Note:  You might notice some irritation and congestion in your nose or some drainage.  This is from the oxygen used during your procedure.  There is no need for concern and it should clear up in a day or so.  SYMPTOMS TO REPORT IMMEDIATELY:   Following lower endoscopy (colonoscopy or flexible sigmoidoscopy):  Excessive amounts of blood in the stool  Significant tenderness or worsening of abdominal pains  Swelling of the abdomen that is new, acute  Fever of 100F or higher  For urgent or emergent issues, a gastroenterologist can be reached at any hour by calling [phone]. Do not use MyChart messaging for urgent concerns.    DIET:  We do recommend a small meal at first,  but then you may proceed to your regular diet.  Drink plenty of fluids but you should avoid alcoholic beverages for 24 hours.  ACTIVITY:  You should plan to take it easy for the rest of today and you should NOT DRIVE or use heavy machinery until tomorrow (because of the sedation medicines used during the test).    FOLLOW UP: Our staff will call the number listed on your records 48-72 hours following your procedure to check on you and address any questions or concerns that you may have regarding the information given to you following your procedure. If we do not reach you, we will leave a message.  We will attempt to reach you two times.  During this call, we will ask if you have developed any symptoms of COVID 19. If you develop any symptoms (ie: fever, flu-like symptoms, shortness of breath, cough etc.) before then, please call [phone].  If you test positive for Covid 19 in the 2 weeks post procedure, please call and report this information to [place].    If any biopsies were taken you will be contacted by phone or by letter within the next 1-3 weeks.  Please call us at [phone] if you have not heard about the biopsies in 3 weeks.    SIGNATURES/CONFIDENTIALITY: You and/or your care partner have signed paperwork which will be entered into your electronic medical record.  These signatures attest to the fact that  that the information above on your After Visit Summary has been reviewed and is understood.  Full responsibility of the confidentiality of this discharge information lies with you and/or your care-partner.

## 2019-11-18 NOTE — Progress Notes (Signed)
Called to room to assist during endoscopic procedure.  Patient ID and intended procedure confirmed with present staff. Received instructions for my participation in the procedure from the performing physician.  

## 2019-11-18 NOTE — Progress Notes (Signed)
Report given to PACU, vss 

## 2019-11-20 ENCOUNTER — Telehealth: Payer: Self-pay | Admitting: *Deleted

## 2019-11-20 NOTE — Telephone Encounter (Signed)
  Follow up Call-  Call back number 11/18/2019  Post procedure Call Back phone  # [phone]  Permission to leave phone message Yes  Some recent data might be hidden     Patient questions:  Do you have a fever, pain , or abdominal swelling? No. Pain Score  0 *  Have you tolerated food without any problems? Yes.    Have you been able to return to your normal activities? Yes.    Do you have any questions about your discharge instructions: Diet   No. Medications  No. Follow up visit  No.  Do you have questions or concerns about your Care? No.  Actions: * If pain score is 4 or above: No action needed, pain <4.  Information provided via wife,pt. Was not at home.   1. Have you developed a fever since your procedure? no  2.   Have you had an respiratory symptoms (SOB or cough) since your procedure? no  3.   Have you tested positive for COVID 19 since your procedure no  4.   Have you had any family members/close contacts diagnosed with the COVID 19 since your procedure?  no   If yes to any of these questions please route to Joylene John, RN and Erenest Rasher, RN

## 2019-11-20 NOTE — Telephone Encounter (Signed)
Message left

## 2019-11-25 ENCOUNTER — Other Ambulatory Visit: Payer: Self-pay

## 2019-11-25 ENCOUNTER — Ambulatory Visit: Payer: Medicare Other | Admitting: Neurology

## 2019-11-25 ENCOUNTER — Encounter: Payer: Self-pay | Admitting: Neurology

## 2019-11-25 VITALS — BP 159/78 | HR 80 | Ht 73.0 in | Wt 202.0 lb

## 2019-11-25 DIAGNOSIS — G3184 Mild cognitive impairment, so stated: Secondary | ICD-10-CM | POA: Diagnosis not present

## 2019-11-25 MED ORDER — DONEPEZIL HCL 5 MG PO TABS
ORAL_TABLET | ORAL | 11 refills | Status: DC
Start: 2019-11-25 — End: 2020-06-29

## 2019-11-25 NOTE — Patient Instructions (Signed)
1. Schedule MRI brain with and without contrast  2. Schedule Neurocognitive testing  3. Start Donepezil 5mg : take 1/2 tablet daily for 2 weeks, then increase to 1 tablet daily  4. Follow-up in 6 months, call for any changes   RECOMMENDATIONS FOR ALL PATIENTS WITH MEMORY PROBLEMS: 1. Continue to exercise (Recommend 30 minutes of walking everyday, or 3 hours every week) 2. Increase social interactions - continue going to [place] and enjoy social gatherings with friends and family 3. Eat healthy, avoid fried foods and eat more fruits and vegetables 4. Maintain adequate blood pressure, blood sugar, and blood cholesterol level. Reducing the risk of stroke and cardiovascular disease also helps promoting better memory. 5. Avoid stressful situations. Live a simple life and avoid aggravations. Organize your time and prepare for the next day in anticipation. 6. Sleep well, avoid any interruptions of sleep and avoid any distractions in the bedroom that may interfere with adequate sleep quality 7. Avoid sugar, avoid sweets as there is a strong link between excessive sugar intake, diabetes, and cognitive impairment We discussed the Mediterranean diet, which has been shown to help patients reduce the risk of progressive memory disorders and reduces cardiovascular risk. This includes eating fish, eat fruits and green leafy vegetables, nuts like almonds and hazelnuts, walnuts, and also use olive oil. Avoid fast foods and fried foods as much as possible. Avoid sweets and sugar as sugar use has been linked to worsening of memory function.

## 2019-11-25 NOTE — Progress Notes (Signed)
NEUROLOGY CONSULTATION NOTE  Brannan Cassedy MRN: [id number] DOB: 21-Jul-1940  Referring provider: Dr. [name] Primary care provider: Dr. [name]  Reason for consult:  Memory loss  Dear Dr Yong Channel:  Thank you for your kind referral of Walter [name] for consultation of the above symptoms. Although his history is well known to you, please allow me to reiterate it for the purpose of our medical record. The patient was accompanied to the clinic by his wife who also provides collateral information. Records and images were personally reviewed where available.   HISTORY OF PRESENT ILLNESS: This is a 79 year old right-handed man with a history of hypertension, hyperlipidemia, diabetes, sleep apnea on CPAP, colon cancer s/p resection, presenting for evaluation of memory loss/word-finding difficulties. He reported symptoms started 2 years ago, MMSE 25/30 in PCP office last 08/2019. He and his wife started noticing more significant changes in the past 1 and 1/2 years. He endorses word-finding difficulties, he tries to tell something and the important part slips by him. He states it is not constant, however his wife says it happens a lot. He is searching for words and thoughts, unable to remember names of things or restaurants. He is forgetting how to do things sometimes, this is not often, but one time he forgot how to put his car in park (had to push a button). He has had this car for 2 years. He repeats stories. He cannot focus when watching TV and she is speaking to him, unable to multitask. He says "huh" or "what did you say" a lot, and she does not think it is his hearing. He lives with his wife. He denies getting lost driving, no missed ills or medications. He denies misplacing things. His wife has also noticed personality changes. He has a short temper worse than before and gets very nasty. When he cannot think of a word, it makes him nervous and uptight. He gets angry and upset when criticized or  when he does not agree. No paranoia or hallucinations, no prior psychiatric history. He states his mood is good. His mother had dementia. No history of significant head injuries. No alcohol use.  He had constipation which resolved with a stool softener. He denies any headaches, dizziness, diplopia, dysarthria/dysphagia, neck/back pain, focal numbness/tingling/weakness, bladder dysfunction, anosmia, or tremors. He sleeps like a baby with his CPAP machine. 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     Laboratory Data: Lab Results  Component Value Date   TSH 2.05 08/19/2019   Lab Results  Component Value Date   [id number] 269 08/19/2019      PAST MEDICAL HISTORY: Past Medical History:  Diagnosis Date  . Colon cancer ([facility]) 1992   colon resection 1992  . Complication of anesthesia    bladder problems after surgery from Anesthesia- can not urinate  . Diabetes mellitus    type 2  . History of shingles   . Hyperlipidemia   . Hypertension   . Osteomyelitis ([facility])    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  . Sleep apnea    uses CPAP nightly  . Tubular adenoma of colon 10/1999    PAST SURGICAL HISTORY: Past Surgical History:  Procedure Laterality Date  . AMPUTATION TOE Right 09/14/2016   Procedure: RIGHT 2ND TOE/RAY AMPUTATION;  Surgeon: Leandrew Koyanagi, MD;  Location: [facility];  Service: Orthopedics;  Laterality: Right;  . APPENDECTOMY  1949  . COLECTOMY  1992  . CYSTOSCOPY N/A 06/21/2015   Procedure:  CYSTOSCOPY, CLOT EVACUATION, AND CAUTERIZATION OF PROSTATE FOSSA;  Surgeon: Carolan Clines, MD;  Location: [facility];  Service: Urology;  Laterality: N/A;  . HEMORRHOID SURGERY    . TRANSURETHRAL RESECTION OF PROSTATE N/A 05/23/2015   Procedure: TRANSURETHRAL RESECTION OF THE PROSTATE (TURP);  Surgeon: Irine Seal, MD;  Location: [facility];  Service: Urology;  Laterality: N/A;    MEDICATIONS: Current Outpatient Medications on File Prior to Visit  Medication Sig Dispense Refill  .  amLODipine (NORVASC) 5 MG tablet TAKE 1 TABLET BY MOUTH EVERY DAY 90 tablet 3  . aspirin 81 MG tablet Take 81 mg by mouth daily.    Marland Kitchen atorvastatin (LIPITOR) 20 MG tablet Take 20 mg by mouth once a week.    . fluticasone (FLONASE) 50 MCG/ACT nasal spray SPRAY 2 SPRAYS INTO EACH NOSTRIL EVERY DAY 48 mL 1  . glimepiride (AMARYL) 4 MG tablet TAKE 2 TABLETS BY MOUTH DAILY BEFORE BREAKFAST. 180 tablet 1  . JANUVIA 100 MG tablet TAKE 1 TABLET BY MOUTH EVERY DAY 90 tablet 1  . metFORMIN (GLUCOPHAGE) 1000 MG tablet TAKE 1 TABLET BY MOUTH 2 TIMES DAILY WITH A MEAL. 180 tablet 3  . Multiple Vitamins-Minerals (CENTRUM SILVER PO) Take 1 tablet by mouth daily.     Glory Rosebush ULTRA test strip USE ONCE DAILY AS INSTRUCTED 100 strip 3  . telmisartan (MICARDIS) 40 MG tablet TAKE 1 TABLET BY MOUTH EVERY DAY 90 tablet 3   No current facility-administered medications on file prior to visit.    ALLERGIES: Allergies  Allergen Reactions  . Bactrim [Sulfamethoxazole-Trimethoprim] Nausea And Vomiting    Pt has chills and fevers also.    FAMILY HISTORY: Family History  Problem Relation Age of Onset  . Colon cancer Father 14  . Diabetes Father        type I  . Alzheimer's disease Mother   . Cancer Other        colon/fhx  . Rectal cancer Maternal Uncle   . Colon cancer Paternal Grandmother 84  . Esophageal cancer Neg Hx   . Stomach cancer Neg Hx     SOCIAL HISTORY: Social History   Socioeconomic History  . Marital status: Married    Spouse name: Not on file  . Number of children: Not on file  . Years of education: Not on file  . Highest education level: Not on file  Occupational History  . Not on file  Tobacco Use  . Smoking status: Former Smoker    Packs/day: 0.25    Years: 10.00    Pack years: 2.50    Types: Pipe  . Smokeless tobacco: Never Used  Vaping Use  . Vaping Use: Never used  Substance and Sexual Activity  . Alcohol use: Yes    Alcohol/week: 2.0 standard drinks    Types: 2  Cans of beer per week    Comment: social  . Drug use: No  . Sexual activity: Not on file  Other Topics Concern  . Not on file  Social History Narrative   Married 1966. 2 daughters Sharee Pimple divorced lives in [place] works for [employer] no kids and Mateo Flow never married in [place], [place] working for [employer] for [employer] improvement no kids.       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)      No religious beliefs affecting health care, no afterlife beliefs   Social Determinants of Radio broadcast assistant  Strain:   . Difficulty of Paying Living Expenses:   Food Insecurity:   . Worried About Charity fundraiser in the Last Year:   . Arboriculturist in the Last Year:   Transportation Needs:   . Film/video editor (Medical):   Marland Kitchen Lack of Transportation (Non-Medical):   Physical Activity:   . Days of Exercise per Week:   . Minutes of Exercise per Session:   Stress:   . Feeling of Stress :   Social Connections:   . Frequency of Communication with Friends and Family:   . Frequency of Social Gatherings with Friends and Family:   . Attends Religious Services:   . Active Member of Clubs or Organizations:   . Attends Archivist Meetings:   Marland Kitchen Marital Status:   Intimate Partner Violence:   . Fear of Current or Ex-Partner:   . Emotionally Abused:   Marland Kitchen Physically Abused:   . Sexually Abused:     REVIEW OF SYSTEMS: Constitutional: No fevers, chills, or sweats, no generalized fatigue, change in appetite Eyes: No visual changes, double vision, eye pain Ear, nose and throat: No hearing loss, ear pain, nasal congestion, sore throat Cardiovascular: No chest pain, palpitations Respiratory:  No shortness of breath at rest or with exertion, wheezes GastrointestinaI: No nausea, vomiting, diarrhea, abdominal pain, fecal incontinence Genitourinary:  No dysuria, urinary retention or frequency Musculoskeletal:   No neck pain, back pain Integumentary: No rash, pruritus, skin lesions Neurological: as above Psychiatric: No depression, insomnia, anxiety Endocrine: No palpitations, fatigue, diaphoresis, mood swings, change in appetite, change in weight, increased thirst Hematologic/Lymphatic:  No anemia, purpura, petechiae. Allergic/Immunologic: no itchy/runny eyes, nasal congestion, recent allergic reactions, rashes  PHYSICAL EXAM: Vitals:   11/25/19 1037  BP: (!) 159/78  Pulse: 80  SpO2: 94%   General: No acute distress Head:  Normocephalic/atraumatic Skin/Extremities: No rash, no edema Neurological Exam: Mental status: alert and oriented to person, place, and time, no dysarthria or aphasia, Fund of knowledge is appropriate.  Recent and remote memory are impaired.  Attention and concentration are normal.  SLUMS score 19/30.  [place] Mental Exam 11/25/2019  Weekday Correct 1  Current year 1  What state are we in? 1  Amount spent 0  Amount left 0  # of Animals 1  5 objects recall 2  Number series 2  Hour markers 2  Time correct 1  Placed X in triangle correctly 1  Largest Figure 1  Name of male 2  Date back to work 0  Type of work 2  State she lived in 2  Total score 19   Cranial nerves: CN I: not tested CN II: pupils equal, round and reactive to light, visual fields intact CN III, IV, VI:  full range of motion, no nystagmus, no ptosis CN V: facial sensation intact CN VII: upper and lower face symmetric CN VIII: hearing intact to conversation Bulk & Tone: normal, no fasciculations. Motor: 5/5 throughout with no pronator drift. Sensation: intact to light touch, cold, pin, vibration and joint position sense.  No extinction to double simultaneous stimulation.  Romberg test negative Deep Tendon Reflexes: +2 throughout Cerebellar: no incoordination on finger to nose testing Gait: narrow-based and steady, able to tandem walk adequately. Tremor:  none   IMPRESSION: This is a pleasant 80 year old right-handed man with a history of hypertension, hyperlipidemia, diabetes, sleep apnea on CPAP, colon cancer s/p resection, presenting for evaluation of memory loss/word-finding difficulties. Neurological exam normal,  SLUMS score 19/30. Despite his SLUMS score, he appears to manage complex tasks without difficulties at this time. 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 We discussed different causes of memory loss. MRI brain with and without contrast will be ordered to assess for underlying structural abnormality. Neurocognitive testing will be ordered to further evaluate memory concerns. We discussed how mood can affect memory, continue to monitor, he may benefit from an SSRI later on. We discussed starting Donepezil 5mg  1/2 tab daily for 2 weeks, then increase to 1 tab daily. Side effects and expectations from medication discussed. We discussed the importance of control of vascular risk factors, physical exercise, and brain stimulation exercises for brain health. Follow-up in 6 months, they know to call for any changes.   Thank you for allowing me to participate in the care of this patient. Please do not hesitate to call for any questions or concerns.   Ellouise Newer, M.D.  CC: Dr. Yong Channel

## 2019-11-26 ENCOUNTER — Encounter: Payer: Self-pay | Admitting: Gastroenterology

## 2019-12-03 ENCOUNTER — Telehealth: Payer: Self-pay | Admitting: Gastroenterology

## 2019-12-03 NOTE — Telephone Encounter (Signed)
Pt is requesting a call back regarding his pathology report.

## 2019-12-03 NOTE — Telephone Encounter (Signed)
I reviewed his pathology results and letter to the patient.  All of his questions were answered.  He will call back for any additional questions or concerns.

## 2019-12-18 ENCOUNTER — Other Ambulatory Visit: Payer: Medicare Other

## 2019-12-21 ENCOUNTER — Ambulatory Visit
Admission: RE | Admit: 2019-12-21 | Discharge: 2019-12-21 | Disposition: A | Discharge status: Home / Self Care | Payer: Medicare Other | Source: Ambulatory Visit | Attending: Neurology | Admitting: Neurology

## 2019-12-21 ENCOUNTER — Other Ambulatory Visit: Payer: Self-pay

## 2019-12-21 DIAGNOSIS — G3184 Mild cognitive impairment, so stated: Secondary | ICD-10-CM

## 2019-12-21 MED ORDER — GADOBENATE DIMEGLUMINE 529 MG/ML IV SOLN
18.0000 mL | Freq: Once | INTRAVENOUS | Status: AC | PRN
  Administered 2019-12-21: 18 mL via INTRAVENOUS

## 2019-12-23 ENCOUNTER — Other Ambulatory Visit: Payer: Self-pay

## 2019-12-23 ENCOUNTER — Ambulatory Visit (INDEPENDENT_AMBULATORY_CARE_PROVIDER_SITE_OTHER): Payer: Medicare Other | Admitting: Psychology

## 2019-12-23 ENCOUNTER — Encounter: Payer: Self-pay | Admitting: Psychology

## 2019-12-23 ENCOUNTER — Ambulatory Visit: Payer: Medicare Other | Admitting: Psychology

## 2019-12-23 DIAGNOSIS — F0670 Mild neurocognitive disorder due to known physiological condition without behavioral disturbance: Secondary | ICD-10-CM

## 2019-12-23 DIAGNOSIS — G3184 Mild cognitive impairment, so stated: Secondary | ICD-10-CM | POA: Diagnosis not present

## 2019-12-23 DIAGNOSIS — R4189 Other symptoms and signs involving cognitive functions and awareness: Secondary | ICD-10-CM

## 2019-12-23 HISTORY — DX: Mild cognitive impairment of uncertain or unknown etiology: G31.84

## 2019-12-23 HISTORY — DX: Mild neurocognitive disorder due to known physiological condition without behavioral disturbance: F06.70

## 2019-12-23 NOTE — Progress Notes (Signed)
NEUROPSYCHOLOGICAL EVALUATION [facility] Department of Neurology  Date of Evaluation: December 23, 2019  Reason for Referral:   Walter [name] is a 79 y.o. right-handed Caucasian male referred by Walter [name], M.D., to characterize his current cognitive functioning and assist with diagnostic clarity and treatment planning in the context of subjective cognitive decline.   Assessment and Plan:   Clinical Impression(s): Walter [name] pattern of performance is suggestive of an impairment across confrontation naming, as well as additional weaknesses across phonemic fluency and receptive language. Performance variability was further exhibited across executive functioning and learning and memory. Performance was appropriate across domains of processing speed, attention/concentration, semantic fluency, and visuospatial abilities. Walter [name] denied difficulties completing instrumental activities of daily living (ADLs) independently. As such, given evidence for cognitive dysfunction described above, he meets criteria for a Mild Neurocognitive Disorder (formerly "mild cognitive impairment") at the present time.  The etiology of his weaknesses is somewhat unclear. While he did exhibit some variability across memory measures, including some appropriate scores across a shape learning task, performance across the other three verbal measures was notably below expectation. While he was not amnestic, he had some difficulty with discriminability of previously learned information which could suggest a weakness in memory storage. Retention percentages were variable, ranging from 27% to 167% (the latter was based on a raw score of 5 on a list learning task). Overall, I cannot rule out the presence of a neurodegenerative illness such as Alzheimer's disease. Poor memory performances, coupled with weaknesses in confrontation naming do raise concerns for this illness. However, performance across  semantic fluency and visuospatial abilities remained strong. If present, this disease process appears in the early stages. Neuroimaging suggested mild for age white matter disease, making a vascular contribution less likely. Behavioral characteristics were not consistent with Lewy body dementia or frontotemporal dementia at the present time. Current psychiatric distress was minimal. Continued medical monitoring will be important moving forward.   Recommendations: A repeat neuropsychological evaluation in 12-18 months (or sooner if functional decline is noted) is recommended to assess the trajectory of future cognitive decline should it occur. This will also aid in future efforts towards improved diagnostic clarity.  Should there be a progression of his current deficits over time, Walter [name] is unlikely to regain any independent living skills lost. Therefore, it is recommended that he remain as involved as possible in all aspects of household chores, finances, and medication management, with supervision to ensure adequate performance. He will likely benefit from the establishment and maintenance of a routine in order to maximize his functional abilities over time.  If not already done, Walter [name] and his family may want to discuss his wishes regarding durable power of attorney and medical decision making, so that he can have input into these choices. Additionally, they may wish to discuss future plans for caretaking and seek out community options for in home/residential care should they become necessary.  Walter [name] is encouraged to attend to lifestyle factors for brain health (e.g., regular physical exercise, good nutrition habits, regular participation in cognitively-stimulating activities, and general stress management techniques), which are likely to have benefits for both emotional adjustment and cognition. Optimal control of vascular risk factors (including safe cardiovascular exercise and adherence to  dietary recommendations) is encouraged. Likewise, continued compliance with his CPAP machine will also be important.  If interested, there are some activities which have therapeutic value and can be useful in keeping him cognitively stimulated. For suggestions, Walter [name] is  encouraged to go to the following website: https://www.[facility].org/get-to-know-barrow/centers-programs/neurorehabilitation-center/neuro-rehab-apps-and-games/ which has options, categorized by level of difficulty. It should be noted that these activities should not be viewed as a substitute for therapy.  When learning new information, he would benefit from information being broken up into small, manageable pieces. He may also find it helpful to articulate the material in his own words and in a context to promote encoding at the onset of a new task. This material may need to be repeated multiple times to promote encoding. Information should also be provided in written format in all instances to help promote recall.   To address problems with executive dysfunction, he may wish to consider:   -Avoiding external distractions when needing to concentrate   -Limiting exposure to fast paced environments with multiple sensory demands   -Writing down complicated information and using checklists   -Attempting and completing one task at a time (i.e., no multi-tasking)   -Verbalizing aloud each step of a task to maintain focus   -Taking frequent breaks during the completion of steps/tasks to avoid fatigue   -Reducing the amount of information considered at one time  Reducing anxiety may also aid in the retrieval of information. Walter [name] is encouraged to prepare scripts he can use socially when he experiences difficulty with word finding or memory. Such scripts should be brief explanations of the difficulty (e.g., "the word escapes me now") and allow him to move the conversation forward quickly rather than dwelling on the issue.  Review of  Records:   Walter [name] was seen by [facility] Walter [name], M.D.) on 11/25/2019 for an evaluation of memory loss. Briefly, Walter [name] reported symptoms starting approximately two years ago. However, he and his wife started noticing more significant changes in the past 1.5 years. He primarily reported word-finding difficulties where he tries to tell someone something and the important part slips by him. He described this as happening occasionally; however, his wife says it happens a lot. His wife stated that he will often search for words and thoughts and is sometimes unable to remember names of things or restaurants. He also seems to forget how to do familiar things (e.g., one time he forgot how to put his car in park). He was also noted to repeat stories, has trouble multi-tasking, and cannot focus when watching TV. He denied getting lost driving and ADLs were described as intact. His wife reported some personality changes in that when he cannot think of a word, he becomes nervous and uptight. He denied any headaches, dizziness, diplopia, dysarthria/dysphagia, neck/back pain, focal numbness/tingling/weakness, bladder dysfunction, anosmia, tremors, hallucinations, or psychiatric distress. He has a history of sleep apnea and uses his CPAP machine nightly. Performance on a brief cognitive screening instrument (MMSE) was 25/30 with his PCP in April 2021. Performance on the SLUMS was 19/30 with Dr. Delice Lesch. Ultimately, Mr. Nevel was referred for a comprehensive neuropsychological evaluation to characterize his cognitive abilities and to assist with diagnostic clarity and treatment planning.   Brain MRI on 12/21/2019 revealed mild for age white matter disease and generalized volume loss.   Past Medical History:  Diagnosis Date  . Allergic rhinitis 08/19/2019   flonase  . BPH (benign prostatic hyperplasia) 07/02/2015   S/p TURP with multiple complications afterwards including recurrent hospitalizations. All  started after a hemorrhoid surgery and later with urinary retention. Patient at one point was septic from urinary issues.    . Complication of anesthesia    bladder problems after surgery from  Anesthesia- can not urinate  . Diabetes mellitus type II, controlled 04/23/2007    Metformin 1g BID, amaryl 4mg --> 8mg , had to add [place] back  Never took victoza.   Januvia-lethargic and dizzy in past. Retrial ok; could change to victoza if needed Serious UTI history- likely avoid sglt2 inhibtor  . History of colon cancer    Tubular adenoma of colon; s/p colectomy 1992   . 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 History of shingles   . Hyperlipidemia associated with type 2 diabetes mellitus 04/23/2007   Atorvastatin 20mg  once weekly    . Hypertension associated with diabetes 04/23/2007   Amlodipine 5mg , telmisartan 40mg     . 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 Obstructive sleep apnea 04/23/2007   CPAP   . 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 Sepsis secondary to UTI 03/29/2015    Past Surgical History:  Procedure Laterality Date  . AMPUTATION TOE Right 09/14/2016   Procedure: RIGHT 2ND TOE/RAY AMPUTATION;  Surgeon: Leandrew Koyanagi, MD;  Location: [facility];  Service: Orthopedics;  Laterality: Right;  . APPENDECTOMY  1949  . COLECTOMY  1992  . CYSTOSCOPY N/A 06/21/2015   Procedure: CYSTOSCOPY, CLOT EVACUATION, AND CAUTERIZATION OF PROSTATE FOSSA;  Surgeon: Carolan Clines, MD;  Location: [facility];  Service: Urology;  Laterality: N/A;  . HEMORRHOID SURGERY    . TRANSURETHRAL RESECTION OF PROSTATE N/A 05/23/2015   Procedure: TRANSURETHRAL RESECTION OF THE PROSTATE (TURP);  Surgeon: Irine Seal, MD;  Location: [facility];  Service: Urology;  Laterality: N/A;    Current Outpatient Medications:  .  amLODipine (NORVASC) 5 MG tablet, TAKE 1 TABLET BY MOUTH EVERY DAY, Disp: 90 tablet, Rfl: 3 .  aspirin 81 MG tablet, Take 81 mg by mouth daily., Disp: , Rfl:  .  atorvastatin (LIPITOR) 20 MG  tablet, Take 20 mg by mouth once a week., Disp: , Rfl:  .  donepezil (ARICEPT) 5 MG tablet, Take 1/2 tablet daily for 2 weeks, then increase to 1 tablet daily, Disp: 30 tablet, Rfl: 11 .  fluticasone (FLONASE) 50 MCG/ACT nasal spray, SPRAY 2 SPRAYS INTO EACH NOSTRIL EVERY DAY, Disp: 48 mL, Rfl: 1 .  glimepiride (AMARYL) 4 MG tablet, TAKE 2 TABLETS BY MOUTH DAILY BEFORE BREAKFAST., Disp: 180 tablet, Rfl: 1 .  JANUVIA 100 MG tablet, TAKE 1 TABLET BY MOUTH EVERY DAY, Disp: 90 tablet, Rfl: 1 .  metFORMIN (GLUCOPHAGE) 1000 MG tablet, TAKE 1 TABLET BY MOUTH 2 TIMES DAILY WITH A MEAL., Disp: 180 tablet, Rfl: 3 .  Multiple Vitamins-Minerals (CENTRUM SILVER PO), Take 1 tablet by mouth daily. , Disp: , Rfl:  .  ONETOUCH ULTRA test strip, USE ONCE DAILY AS INSTRUCTED, Disp: 100 strip, Rfl: 3 .  telmisartan (MICARDIS) 40 MG tablet, TAKE 1 TABLET BY MOUTH EVERY DAY, Disp: 90 tablet, Rfl: 3  Clinical Interview:   Cognitive Symptoms: Decreased short-term memory: Endorsed. However, memory difficulties were largely attributed to word finding concerns which have been worsening over the past 8 months. Difficulties were said to be present occasionally and not all the time. Mr. Marcell denied more traditional short-term memory difficulties (e.g., forgetting details of previous conversations or misplacing things around his home).  Decreased long-term memory: Denied. Decreased attention/concentration: Denied. Reduced processing speed: Denied. Difficulties with executive functions: Denied. Difficulties with emotion regulation: Largely denied. He acknowledged some impulsivity in that he has a tendency to want things that can be completed done right away. This was said to be  longstanding in nature. He denied difficulties with organization, decision making, or judgment. Overt personality changes were likewise denied.  Difficulties with receptive language: Denied. Difficulties with word finding: Endorsed (see above).     Decreased visuoperceptual ability: Denied.  Difficulties completing ADLs: Denied.  Additional Medical History: History of traumatic brain injury/concussion: Denied. History of stroke: Denied. History of seizure activity: Denied. History of known exposure to toxins: Denied. Symptoms of chronic pain: Denied. Experience of frequent headaches/migraines: Denied. Frequent instances of dizziness/vertigo: Denied.  Sensory changes: Denied.  Balance/coordination difficulties: Denied. Other motor difficulties: Denied.  Sleep History: Estimated hours obtained each night: 6-7 hours.  Difficulties falling asleep: Denied when using his CPAP machine.  Difficulties staying asleep: Denied. However, he did acknowledge occasionally waking up earlier than desired.  Feels rested and refreshed upon awakening: Endorsed "for the most part."   History of snoring: Endorsed. History of waking up gasping for air: Endorsed. Witnessed breath cessation while asleep: Endorsed. He acknowledged a history of obstructive sleep apnea and uses his CPAP machine nightly.   History of vivid dreaming: Denied. Excessive movement while asleep: Denied. Instances of acting out his dreams: Denied.  Psychiatric/Behavioral Health History: Depression: Denied. He described his current mood as "the same" and denied a history of mental health concerns or diagnoses. Current or remote suicidal ideation, intent, or plan was also denied.  Anxiety: Denied. Mania: Denied. Trauma History: Denied. Visual/auditory hallucinations: Denied. Delusional thoughts: Denied.  Tobacco: Denied. Alcohol: He denied current alcohol consumption as well as a history of problematic alcohol abuse or dependence.  Recreational drugs: Denied. Caffeine: 2-3 cups of coffee in the morning.   Family History: Problem Relation Age of Onset  . Colon cancer Father 62  . Diabetes Father        type I  . Alzheimer's disease Mother   . Cancer Other         colon/fhx  . Rectal cancer Maternal Uncle   . Colon cancer Paternal Grandmother 98  . Esophageal cancer Neg Hx   . Stomach cancer Neg Hx    This information was confirmed by Mr. Cremeens.  Academic/Vocational History: Highest level of educational attainment: 18 years. Mr. Chriscoe was born in [place] and spent time in [place] prior to coming to the [place]. While he completed the equivalent of high school overseas, he had to repeat the final two years of high school when coming to the [place] due to him "not knowing who Ginnie Smart was." His first language was [place]. However, he speaks [place] fluently and considers it his primary language. 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 He described himself as a strong (A/B) student in academic settings. English was described as a relative weakness earlier in academic settings.  History of developmental delay: Denied. History of grade repetition: Denied. Enrollment in special education courses: Denied. History of LD/ADHD: Denied.  Employment: Retired. 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   Evaluation Results:   Behavioral Observations: Mr. Stehlin was unaccompanied, arrived to his appointment on time, and was appropriately dressed and groomed. He appeared alert and oriented. Observed gait and station were within normal limits. Gross motor functioning appeared intact upon informal observation and no abnormal movements (e.g., tremors) were noted. His affect was generally relaxed and positive, but did range appropriately given the subject being discussed during the clinical interview or the task at hand during testing procedures. Spontaneous speech  was fluent and word finding difficulties were not observed during the clinical interview. Thought processes were coherent, organized, and normal in content. Insight into his cognitive difficulties  appeared limited in that he largely denied cognitive difficulties outside of word finding during interview. During testing, sustained attention was appropriate. Task engagement was adequate and he persisted when challenged. He did exhibit some impulsive actions during testing where he would commonly shake his head while saying "okay" during task instruction and attempt to start tasks prior to instructions being completed. Despite these actions, he often then struggled on these tasks. Overall, Mr. Klatt was cooperative with the clinical interview and subsequent testing procedures.   Adequacy of Effort: The validity of neuropsychological testing is limited by the extent to which the individual being tested may be assumed to have exerted adequate effort during testing. Mr. Hitchens expressed his intention to perform to the best of his abilities and exhibited adequate task engagement and persistence. Scores across stand-alone and embedded performance validity measures were within expectation. As such, the results of the current evaluation are believed to be a valid representation of Mr. Constantin's current cognitive functioning.  Test Results: Mr. Osorto was generally oriented at the time of the current evaluation.  Intellectual abilities based upon educational and vocational attainment were estimated to be in the average to above average range. Premorbid abilities were estimated to be within the below average range based upon a single-word reading test.   Processing speed was below average to average. Basic attention was average. More complex attention (e.g., working memory) was also average. Executive functioning was variable, ranging from the exceptionally low to above average normative ranges.  Assessed receptive language abilities were exceptionally low. Despite this, Mr. Schwabe did not exhibit any difficulties comprehending task instructions and answered all questions asked of him appropriately. Points were  lost on this task across questions with sequential steps (e.g., point to X after pointing to Y) and may be the result of impulsivity. Sentence repetition was below average, while performance on a semantic knowledge screening test was within expectation. Assessed expressive language was variable. Phonemic fluency was well below average, semantic fluency was average, and confrontation naming was exceptionally low.     Assessed visuospatial/visuoconstructional abilities were average. Points were lost on his drawing of a clock due to him only drawing one hand and not placing it in a correct location. Points were lost on his drawing of a complex figure due to a rapid and sloppy approach.   Learning (i.e., encoding) of novel verbal and visual information was variable, ranging from the exceptionally low to average normative ranges. Spontaneous delayed recall (i.e., retrieval) of previously learned information was also exceptionally low to average. Retention rates were 81% across a story learning task, 167% (raw score of 5) across a list learning task, and 50% across a shape learning task. Performance across learning curves was generally flat. A negative curve was exhibited across a shape learning task (i.e., raw scores of 8, 5, and then 4). Performance across recognition tasks was exceptionally low to average, suggesting some evidence for information consolidation.   Results of emotional screening instruments suggested that recent symptoms of generalized anxiety were in the minimal range, while symptoms of depression were within normal limits. A screening instrument assessing recent sleep quality suggested the presence of minimal sleep dysfunction.  Tables of Scores:   Note: This summary of test scores accompanies the interpretive report and should not be considered in isolation without reference to the appropriate sections in  the text. Descriptors are based on appropriate normative data and may be adjusted based  on clinical judgment. The terms "impaired" and "within normal limits (WNL)" are used when a more specific level of functioning cannot be determined.       Effort Testing:   DESCRIPTOR       Dot Counting Test: --- --- Within Expectation  NAB EVI: --- --- Within Expectation  D-KEFS Color Word Effort Index: --- --- Within Expectation       Orientation:      Raw Score Percentile   NAB Orientation, Form 1 28/29 --- ---       Cognitive Screening:           Raw Score Percentile   SLUMS: 22/30 --- ---       Intellectual Functioning:           Standard Score Percentile   Test of Premorbid Functioning: 88 21 Below Average       Memory:          NAB Memory Module, Form 2: Standard Score/ T Score Percentile   List Learning       Total Trials 1-3 13/36 (30) 2 Well Below Average    List B 2/12 (36) 8 Well Below Average    Short Delay Free Recall 3/12 (29) 2 Exceptionally Low    Long Delay Free Recall 5/12 (41) 18 Below Average    Retention Percentage 167 (74) 99 Exceptionally High    Recognition Discriminability 4 (40) 16 Below Average  Shape Learning       Total Trials 1-3 17/27 (56) 73 Average    Delayed Recall 4/9 (43) 25 Average    Retention Percentage 50 (34) 5 Well Below Average    Recognition Discriminability 3 (35) 7 Well Below Average  Story Learning       Immediate Recall 38/80 (42) 21 Below Average    Delayed Recall 17/40 (40) 16 Below Average    Retention Percentage 81 (47) 38 Average  Daily Living Memory       Immediate Recall 24/51 (22) <1 Exceptionally Low    Delayed Recall 3/17 (19) <1 Exceptionally Low    Retention Percentage 27 (14) <1 Exceptionally Low    Recognition Hits 4/10 (18) <1 Exceptionally Low       Attention/Executive Function:          Trail Making Test (TMT): Raw Score (T Score) Percentile     Part A 48 secs.,  0 errors (39) 14 Below Average    Part B 122 secs.,  4 errors (42) 21 Below Average         Scaled Score Percentile   WAIS-IV Coding:  7 16 Below Average       NAB Attention Module, Form 1: T Score Percentile     Digits Forward 45 31 Average    Digits Backwards 45 31 Average       D-KEFS Color-Word Interference Test: Raw Score (Scaled Score) Percentile     Color Naming 44 secs. (6) 9 Below Average    Word Reading 23 secs. (11) 63 Average    Inhibition 90 secs. (7) 16 Below Average      Total Errors 1 error (12) 75 Above Average    Inhibition/Switching 96 secs. (8) 25 Average      Total Errors 5 errors (8) 25 Average       [place] Card Sorting Test: Raw Score Percentile     Categories (trials) 2 (64) >16 Within  Normal Limits    Total Errors 33 8 Well Below Average    Perseverative Errors 28 1 Exceptionally Low    Non-Perseverative Errors 5 98 Exceptionally High    Failure to Maintain Set 0 --- ---       Language:           Raw Score Percentile   PPVT Screening Instrument: 15/16 --- Within Expectation  Sentence Repetition: 13/22 11 Below Average       Verbal Fluency Test: Raw Score (T Score) Percentile     Phonemic Fluency (FAS) 28 (36) 8 Well Below Average    Animal Fluency 21 (55) 69 Average        NAB Language Module, Form 1: T Score Percentile     Auditory Comprehension 29 2 Exceptionally Low    Naming 25/31 (27) 1 Exceptionally Low       Visuospatial/Visuoconstruction:      Raw Score Percentile   Clock Drawing: 7/10 --- Within Normal Limits       NAB Spatial Module, Form 1: T Score Percentile     Figure Drawing Copy 44 27 Average        Scaled Score Percentile   WAIS-IV Block Design: 10 50 Average       Mood and Personality:      Raw Score Percentile   Geriatric Depression Scale: 2 --- Within Normal Limits  Geriatric Anxiety Scale: 5 --- Minimal    Somatic 3 --- Minimal    Cognitive 0 --- Minimal    Affective 2 --- Minimal       Additional Questionnaires:      Raw Score Percentile   PROMIS Sleep Disturbance Questionnaire: 21 --- None to Slight   Informed Consent and Coding/Compliance:     Mr. Baley was provided with a verbal description of the nature and purpose of the present neuropsychological evaluation. Also reviewed were the foreseeable risks and/or discomforts and benefits of the procedure, limits of confidentiality, and mandatory reporting requirements of this provider. The patient was given the opportunity to ask questions and receive answers about the evaluation. Oral consent to participate was provided by the patient.   This evaluation was conducted by Christia Reading, Ph.D., licensed clinical neuropsychologist. Mr. Willhoite completed a comprehensive clinical interview with Dr. Melvyn Novas, billed as one unit [phone], and 130 minutes of cognitive testing and scoring, billed as one unit [phone] and three additional units [id number]. Psychometrist Milana Kidney, B.S., assisted Dr. Melvyn Novas with test administration and scoring procedures. As a separate and discrete service, Dr. Melvyn Novas spent a total of 130 minutes in interpretation and report writing billed as one unit [phone] and one unit [phone].

## 2019-12-23 NOTE — Progress Notes (Signed)
   Psychometrician Note   Cognitive testing was administered to Walter [name] by Milana Kidney, B.S. (psychometrist) under the supervision of Dr. Christia Reading, Ph.D., licensed psychologist on 12/23/19. Walter [name] did not appear overtly distressed by the testing session per behavioral observation or responses across self-report questionnaires. Dr. Christia Reading, Ph.D. checked in with Walter [name] as needed to manage any distress related to testing procedures (if applicable). Rest breaks were offered.    The battery of tests administered was selected by Dr. Christia Reading, Ph.D. with consideration to Walter [name] current level of functioning, the nature of his symptoms, emotional and behavioral responses during interview, level of literacy, observed level of motivation/effort, and the nature of the referral question. This battery was communicated to the psychometrist. Communication between Dr. Christia Reading, Ph.D. and the psychometrist was ongoing throughout the evaluation and Dr. Christia Reading, Ph.D. was immediately accessible at all times. Dr. Christia Reading, Ph.D. provided supervision to the psychometrist on the date of this service to the extent necessary to assure the quality of all services provided.    [name] will return within approximately 1-2 weeks for an interactive feedback session with Dr. Melvyn Novas at which time his test performances, clinical impressions, and treatment recommendations will be reviewed in detail. Walter [name] understands he can contact our office should he require our assistance before this time.  A total of 130 minutes of billable time were spent face-to-face with Walter [name] by the psychometrist. This includes both test administration and scoring time. Billing for these services is reflected in the clinical report generated by Dr. Christia Reading, Ph.D..  This note reflects time spent with the psychometrician and does not include test scores or any clinical interpretations  made by Dr. Melvyn Novas. The full report will follow in a separate note.

## 2019-12-26 ENCOUNTER — Telehealth: Payer: Self-pay

## 2019-12-26 NOTE — Telephone Encounter (Signed)
-----   Message from Cameron Sprang, MD sent at 12/23/2019  8:44 AM EDT ----- Pls let him know MRI brain looked fine, no evidence of tumor, stroke, or bleed. It showed age-related changes. Thanks

## 2019-12-27 NOTE — Telephone Encounter (Signed)
Called patient and informed him of MRI results. Patient verbalized understanding and confirmed that his 6 month f/u scheduled with Dr. Delice Lesch.

## 2019-12-30 ENCOUNTER — Ambulatory Visit (INDEPENDENT_AMBULATORY_CARE_PROVIDER_SITE_OTHER): Payer: Medicare Other | Admitting: Psychology

## 2019-12-30 ENCOUNTER — Other Ambulatory Visit: Payer: Self-pay

## 2019-12-30 DIAGNOSIS — G3184 Mild cognitive impairment, so stated: Secondary | ICD-10-CM

## 2019-12-30 NOTE — Patient Instructions (Signed)
Recommendations: A repeat neuropsychological evaluation in 12-18 months (or sooner if functional decline is noted) is recommended to assess the trajectory of future cognitive decline should it occur. This will also aid in future efforts towards improved diagnostic clarity.  Should there be a progression of his current deficits over time, Walter [name] is unlikely to regain any independent living skills lost. Therefore, it is recommended that he remain as involved as possible in all aspects of household chores, finances, and medication management, with supervision to ensure adequate performance. He will likely benefit from the establishment and maintenance of a routine in order to maximize his functional abilities over time.  If not already done, Walter [name] and his family may want to discuss his wishes regarding durable power of attorney and medical decision making, so that he can have input into these choices. Additionally, they may wish to discuss future plans for caretaking and seek out community options for in home/residential care should they become necessary.  Walter [name] is encouraged to attend to lifestyle factors for brain health (e.g., regular physical exercise, good nutrition habits, regular participation in cognitively-stimulating activities, and general stress management techniques), which are likely to have benefits for both emotional adjustment and cognition. Optimal control of vascular risk factors (including safe cardiovascular exercise and adherence to dietary recommendations) is encouraged. Likewise, continued compliance with his CPAP machine will also be important.  If interested, there are some activities which have therapeutic value and can be useful in keeping him cognitively stimulated. For suggestions, Walter [name] is encouraged to go to the following website: https://www.[facility].org/get-to-know-barrow/centers-programs/neurorehabilitation-center/neuro-rehab-apps-and-games/ which has  options, categorized by level of difficulty. It should be noted that these activities should not be viewed as a substitute for therapy.  When learning new information, he would benefit from information being broken up into small, manageable pieces. He may also find it helpful to articulate the material in his own words and in a context to promote encoding at the onset of a new task. This material may need to be repeated multiple times to promote encoding. Information should also be provided in written format in all instances to help promote recall.   To address problems with executive dysfunction, he may wish to consider:   -Avoiding external distractions when needing to concentrate   -Limiting exposure to fast paced environments with multiple sensory demands   -Writing down complicated information and using checklists   -Attempting and completing one task at a time (i.e., no multi-tasking)   -Verbalizing aloud each step of a task to maintain focus   -Taking frequent breaks during the completion of steps/tasks to avoid fatigue   -Reducing the amount of information considered at one time  Reducing anxiety may also aid in the retrieval of information. Walter [name] is encouraged to prepare scripts he can use socially when he experiences difficulty with word finding or memory. Such scripts should be brief explanations of the difficulty (e.g., "the word escapes me now") and allow him to move the conversation forward quickly rather than dwelling on the issue.

## 2019-12-30 NOTE — Progress Notes (Signed)
   Neuropsychology Feedback Session Walter [name]. [facility]  Reason for Referral:   [name] a 79 y.o. right-handed Caucasian male referred by Ellouise Newer, M.D.,to characterize hiscurrent cognitive functioning and assist with diagnostic clarity and treatment planning in the context of subjective cognitive decline.   Feedback:   [name] completed a comprehensive neuropsychological evaluation on 12/23/2019. Please refer to that encounter for the full report and recommendations. Briefly, results suggested an impairment across confrontation naming, as well as additional weaknesses across phonemic fluency and receptive language. Performance variability was further exhibited across executive functioning and learning and memory. Performance was appropriate across domains of processing speed, attention/concentration, semantic fluency, and visuospatial abilities. The etiology of his weaknesses is somewhat unclear. While he did exhibit some variability across memory measures, including some appropriate scores across a shape learning task, performance across the other three verbal measures was notably below expectation. While he was not amnestic, he had some difficulty with discriminability of previously learned information which could suggest a weakness in memory storage. Overall, I cannot rule out the presence of a neurodegenerative illness such as Alzheimer's disease. Poor memory performances, coupled with weaknesses in confrontation naming do raise concerns for this illness. However, performance across semantic fluency and visuospatial abilities remained strong. If present, this disease process appears in the early stages.   The feedback was completed at 10:00am rather than his scheduled time of 2:30pm. [name] was accompanied by his wife during the current telephone call. They were within their residence while I was within my office. Content of the current  session focused on the results of his neuropsychological evaluation. [name] and his wife were given the opportunity to ask questions and their questions were answered. They were encouraged to reach out should additional questions arise. A copy of his report was mailed at the conclusion of the visit.      23 minutes were spent conducting the current feedback session with [name], billed as one unit [phone].

## 2020-01-16 ENCOUNTER — Other Ambulatory Visit: Payer: Self-pay

## 2020-01-16 ENCOUNTER — Ambulatory Visit (INDEPENDENT_AMBULATORY_CARE_PROVIDER_SITE_OTHER): Payer: Medicare Other

## 2020-01-16 ENCOUNTER — Encounter: Payer: Self-pay | Admitting: Family Medicine

## 2020-01-16 DIAGNOSIS — Z23 Encounter for immunization: Secondary | ICD-10-CM | POA: Diagnosis not present

## 2020-02-18 NOTE — Patient Instructions (Addendum)
No changes today  Please stop by lab before you go If you have mychart- we will send your results within 3 business days of [place] receiving them.  If you do not have mychart- we will call you about results within 5 business days of [place] receiving them.  *please note we are currently using [facility] which has a longer processing time than [place] typically so labs may not come back as quickly as in the past *please also note that you will see labs on mychart as soon as they post. I will later go in and write notes on them- will say "notes from Dr. Yong Channel"  Please check with your pharmacy to see if they have the shingrix vaccine. If they do- please get this immunization and update [place] by phone call or mychart with dates you receive the vaccine

## 2020-02-18 NOTE — Progress Notes (Signed)
Phone: [phone]   Subjective:  Patient presents today for their annual physical. Chief complaint-noted.   See problem oriented charting- Review of Systems  Constitutional: Negative for chills and fever.  HENT: Negative for hearing loss and nosebleeds.   Eyes: Negative for blurred vision and double vision.  Respiratory: Negative for cough and shortness of breath.   Cardiovascular: Negative for chest pain and palpitations.  Gastrointestinal: Negative for constipation, diarrhea, nausea and vomiting.  Genitourinary: Negative for dysuria and frequency.  Musculoskeletal: Negative for back pain and myalgias.  Skin: Negative for itching and rash.  Neurological: Negative for dizziness and headaches.  Endo/Heme/Allergies: Negative for polydipsia. Does not bruise/bleed easily.  Psychiatric/Behavioral: Negative for depression and suicidal ideas.   The following were reviewed and entered/updated in epic: Past Medical History:  Diagnosis Date  . Allergic rhinitis 08/19/2019   flonase  . BPH (benign prostatic hyperplasia) 07/02/2015   S/p TURP with multiple complications afterwards including recurrent hospitalizations. All started after a hemorrhoid surgery and later with urinary retention. Patient at one point was septic from urinary issues.    . Complication of anesthesia    bladder problems after surgery from Anesthesia- can not urinate  . Diabetes mellitus type II, controlled 04/23/2007    Metformin 1g BID, amaryl 4mg --> 8mg , had to add [place] back  Never took victoza.   Januvia-lethargic and dizzy in past. Retrial ok; could change to victoza if needed Serious UTI history- likely avoid sglt2 inhibtor  . History of colon cancer    Tubular adenoma of colon; s/p colectomy 1992   . 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 History of shingles   . Hyperlipidemia associated with type 2 diabetes mellitus 04/23/2007   Atorvastatin 20mg  once weekly    . Hypertension associated with diabetes  04/23/2007   Amlodipine 5mg , telmisartan 40mg     . Mild neurocognitive disorder 12/23/2019  . 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 Obstructive sleep apnea 04/23/2007   CPAP   . 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 Sepsis secondary to UTI 03/29/2015   Patient Active Problem List   Diagnosis Date Noted  . 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    Priority: High  . BPH (benign prostatic hyperplasia) 07/02/2015    Priority: High  . Diabetes mellitus type II, controlled 04/23/2007    Priority: High  . History of colon cancer 01/27/2014    Priority: Medium  . Hyperlipidemia associated with type 2 diabetes mellitus 04/23/2007    Priority: Medium  . Obstructive sleep apnea 04/23/2007    Priority: Medium  . Hypertension associated with diabetes 04/23/2007    Priority: Medium  . 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    Priority: Low  . Hallux valgus of right foot 03/26/2014    Priority: Low  . Iritis 12/17/2008    Priority: Low  . Postherpetic neuralgia 05/22/2008    Priority: Low  . 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 Bigeminy 04/21/2008    Priority: Low  . Allergic rhinitis 08/19/2019  . 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 AMPUTATION TOE Right 09/14/2016   Procedure: RIGHT 2ND TOE/RAY AMPUTATION;  Surgeon: Leandrew Koyanagi, MD;  Location: [facility];  Service: Orthopedics;  Laterality: Right;  . APPENDECTOMY  1949  . COLECTOMY  1992  . CYSTOSCOPY N/A 06/21/2015   Procedure: CYSTOSCOPY,  CLOT EVACUATION, AND CAUTERIZATION OF PROSTATE FOSSA;  Surgeon: Carolan Clines, MD;  Location: [facility];  Service: Urology;  Laterality: N/A;  . HEMORRHOID SURGERY    . TRANSURETHRAL RESECTION OF PROSTATE N/A 05/23/2015   Procedure: TRANSURETHRAL RESECTION OF THE PROSTATE (TURP);  Surgeon: Irine Seal, MD;  Location: [facility];  Service: Urology;  Laterality: N/A;    Family History  Problem Relation Age of Onset  . Colon cancer Father 77  . Diabetes Father        type I  . Alzheimer's disease Mother   . Cancer Other        colon/fhx  . Rectal cancer Maternal Uncle   . Colon cancer Paternal Grandmother 11  . Esophageal cancer Neg Hx   . Stomach cancer Neg Hx     Medications- reviewed and updated Current Outpatient Medications  Medication Sig Dispense Refill  . amLODipine (NORVASC) 5 MG tablet TAKE 1 TABLET BY MOUTH EVERY DAY 90 tablet 3  . atorvastatin (LIPITOR) 20 MG tablet Take 20 mg by mouth once a week.    . donepezil (ARICEPT) 5 MG tablet Take 1/2 tablet daily for 2 weeks, then increase to 1 tablet daily (Patient taking differently: Take 5 mg by mouth at bedtime. ) 30 tablet 11  . fluticasone (FLONASE) 50 MCG/ACT nasal spray SPRAY 2 SPRAYS INTO EACH NOSTRIL EVERY DAY 48 mL 1  . glimepiride (AMARYL) 4 MG tablet TAKE 2 TABLETS BY MOUTH DAILY BEFORE BREAKFAST. 180 tablet 1  . JANUVIA 100 MG tablet TAKE 1 TABLET BY MOUTH EVERY DAY 90 tablet 1  . metFORMIN (GLUCOPHAGE) 1000 MG tablet TAKE 1 TABLET BY MOUTH 2 TIMES DAILY WITH A MEAL. 180 tablet 3  . Multiple Vitamins-Minerals (CENTRUM SILVER PO) Take 1 tablet by mouth daily.     Glory Rosebush ULTRA test strip USE ONCE DAILY AS INSTRUCTED 100 strip 3  . telmisartan (MICARDIS) 40 MG tablet TAKE 1 TABLET BY MOUTH EVERY DAY 90 tablet 3   No current facility-administered medications for this visit.    Allergies-reviewed and updated Allergies  Allergen Reactions  . Bactrim [Sulfamethoxazole-Trimethoprim] Nausea And Vomiting    Pt has chills and fevers also.    Social History   Social History Narrative   Married 1966. 2 daughters Sharee Pimple divorced lives in [place] works for [employer] no kids and Mateo Flow never married in [place], [place] working for [employer] for [employer] improvement no kids.       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 Oropharynx normal. TM normal  Neck: no thyromegaly CV: RRR no murmurs rubs or gallops Lungs: CTAB no crackles, wheeze, rhonchi Abdomen: soft/nontender/nondistended/normal bowel sounds. No rebound or guarding.  Ext: no edema Skin: warm, dry Neuro: grossly normal, moves all extremities, PERRLA  Diabetic Foot Exam - Simple   Simple Foot Form Diabetic Foot exam was performed with the following findings: Yes 02/20/2020 10:01 AM  Visual  Inspection See comments: Yes Sensation Testing Intact to touch and monofilament testing bilaterally: Yes Pulse Check Posterior Tibialis and Dorsalis pulse intact bilaterally: Yes Comments S/p amputation 2nd toe on right foot- appears stable. Slight callus buildup base of first toe on right and under IP joint- advised consider donut cushion        Assessment and Plan  79 y.o. male presenting for annual physical.  Health Maintenance counseling: 1. Anticipatory guidance: Patient counseled regarding regular dental exams -q6 months, eye exams -yearly,  avoiding smoking and second hand smoke , limiting alcohol to 2 beverages per day - well under this.   2. Risk factor reduction:  Advised patient of need for regular exercise and diet rich and fruits and vegetables to reduce risk of heart attack and stroke. Exercise- more limited recently- recommended 150 minutes a week. Diet-reasonably healthy.  Wt Readings from Last 3 Encounters:  02/20/20 200 lb 3.2 oz (90.8 kg)  11/25/19 202 lb (91.6 kg)  11/18/19 198 lb (89.8 kg)  3.  Immunizations/screenings/ancillary studies- had covid booster- team will log this from October 3rd. Hep c screening today . Discussed shingrix  At pharmacy.  Immunization History  Administered Date(s) Administered  . Fluad Quad(high Dose 65+) 01/08/2019, 01/16/2020  . Influenza Split 01/31/2011, 01/16/2012  . Influenza Whole 03/07/2007, 01/31/2008, 01/30/2009  . Influenza, High Dose Seasonal PF 01/11/2017, 02/05/2018  . Influenza,inj,Quad PF,6+ Mos 01/21/2013, 01/27/2014, 12/26/2014  . Influenza-Unspecified 01/05/2016  . PFIZER SARS-COV-2 Vaccination 05/24/2019, 06/14/2019  . Pneumococcal Conjugate-13 08/19/2013  . Pneumococcal Polysaccharide-23 04/04/2007  . Td 04/04/2007, 09/14/2017  . Varicella 08/19/2013   4. Prostate cancer screening- passed age based screening recommendations  Lab Results  Component Value Date   PSA 2.60 08/05/2013   PSA 2.35 12/03/2010   PSA 1.80 11/06/2009   5. Colon cancer screening - history of colon cancer but has had excellent follow up for this- Dr. Fuller Plan said no further colonoscopy  6. Skin cancer screening- follows with derm regularly. advised regular sunscreen use. Denies worrisome, changing, or new skin lesions.  7. former smoker- quit over 40 years ago- no regular screening needed 8. STD screening - opts out as monogamous   Status of chronic or acute concerns   #Mild neurocognitive disorder-followed by Dr. Marcy Salvo on Aricept. He feels like this has been helpful. Feels like does not struggle with mat still.   #hypertension S: medication: Telmisartan 40Mg , amlodipine 5Mg , BP Readings from Last 3 Encounters:  02/20/20 124/74  11/25/19 (!) 159/78  11/18/19 110/68  A/P:  Stable. Continue current medications.    # Diabetes S: Medication: metformin 1000Mg  twice daily, Januvia 100Mg , Glimipride 8Mg   CBGs- 172 this Am after chinese food last night- usually 140-150 Exercise and diet- exercise has been down some lately- 1 or 2 times a week.  Weight  only 1 lb up  Lab Results  Component Value Date   HGBA1C 6.7 (H) 08/19/2019   HGBA1C 6.9 (H) 02/25/2019   HGBA1C 7.2 (H) 10/24/2018   A/P: Reasonable control on last check-update labs today- for now continue current meds  #hyperlipidemia S: Medication: atorvastatin 20Mg  once a week Lab Results  Component Value Date   CHOL 143 02/25/2019   HDL 37.70 (L) 02/25/2019   LDLCALC 79 02/25/2019   LDLDIRECT 90.0 09/14/2017   TRIG 133.0 02/25/2019   CHOLHDL 4 02/25/2019   A/P: With memory issues I do not feel strongly about increasing dose of atorvastatin-continue current medication as long as LDL under 100 - we discussed stopping  aspirin  # OSA- needs new cpap machine but is on backorder  Recommended follow up: Return in about 6 months (around 08/20/2020) for follow up- or sooner if needed. sooner if a1c above 7.5 . Future Appointments  Date Time Provider [place]  06/29/2020  4:00 PM Cameron Sprang, MD [facility] None  12/22/2020  8:30 AM Hazle Coca, PhD [facility] None  12/22/2020  9:30 AM Idamae Schuller- NEUROPSYCH TECH [facility] None  12/29/2020  2:30 PM Hazle Coca, PhD [facility] None   Lab/Order associations:   ICD-10-CM   1. Preventative health care  Z00.00   2. Hypertension associated with diabetes  E11.59    I15.2   3. Controlled type 2 diabetes mellitus without complication, without long-term current use of insulin (HCC)  E11.9 Hemoglobin A1c    CBC With Differential/Platelet    COMPLETE METABOLIC PANEL WITH GFR    Lipid Panel w/reflex Direct LDL  4. Hyperlipidemia associated with type 2 diabetes mellitus  E11.69    E78.5   5. Encounter for hepatitis C screening test for low risk patient  Z11.59 Hepatitis C antibody  6. Acquired absence of other right toe(s)  Z89.421      Return precautions advised.  [name], MD

## 2020-02-20 ENCOUNTER — Other Ambulatory Visit: Payer: Self-pay

## 2020-02-20 ENCOUNTER — Encounter: Payer: Self-pay | Admitting: Family Medicine

## 2020-02-20 ENCOUNTER — Ambulatory Visit: Payer: Medicare Other | Admitting: Family Medicine

## 2020-02-20 VITALS — BP 124/74 | HR 75 | Temp 97.6°F | Resp 18 | Ht 73.0 in | Wt 200.2 lb

## 2020-02-20 DIAGNOSIS — Z1159 Encounter for screening for other viral diseases: Secondary | ICD-10-CM

## 2020-02-20 DIAGNOSIS — Z89421 Acquired absence of other right toe(s): Secondary | ICD-10-CM

## 2020-02-20 DIAGNOSIS — E1159 Type 2 diabetes mellitus with other circulatory complications: Secondary | ICD-10-CM

## 2020-02-20 DIAGNOSIS — E1169 Type 2 diabetes mellitus with other specified complication: Secondary | ICD-10-CM | POA: Diagnosis not present

## 2020-02-20 DIAGNOSIS — E119 Type 2 diabetes mellitus without complications: Secondary | ICD-10-CM

## 2020-02-20 DIAGNOSIS — Z0000 Encounter for general adult medical examination without abnormal findings: Secondary | ICD-10-CM | POA: Diagnosis not present

## 2020-02-20 DIAGNOSIS — I152 Hypertension secondary to endocrine disorders: Secondary | ICD-10-CM

## 2020-02-20 DIAGNOSIS — E785 Hyperlipidemia, unspecified: Secondary | ICD-10-CM

## 2020-02-24 LAB — COMPLETE METABOLIC PANEL WITH GFR
AG Ratio: 1.8 (calc) (ref 1.0–2.5)
ALT: 28 U/L (ref 9–46)
AST: 16 U/L (ref 10–35)
Albumin: 4.4 g/dL (ref 3.6–5.1)
Alkaline phosphatase (APISO): 72 U/L (ref 35–144)
BUN: 16 mg/dL (ref 7–25)
CO2: 29 mmol/L (ref 20–32)
Calcium: 9.7 mg/dL (ref 8.6–10.3)
Chloride: 101 mmol/L (ref 98–110)
Creat: 1.01 mg/dL (ref 0.70–1.18)
GFR, Est African American: 82 mL/min/{1.73_m2} (ref >=60)
GFR, Est Non African American: 70 mL/min/{1.73_m2} (ref >=60)
Globulin: 2.4 g/dL (calc) (ref 1.9–3.7)
Glucose, Bld: 233 mg/dL — ABNORMAL HIGH (ref 65–99)
Potassium: 4.8 mmol/L (ref 3.5–5.3)
Sodium: 138 mmol/L (ref 135–146)
Total Bilirubin: 0.5 mg/dL (ref 0.2–1.2)
Total Protein: 6.8 g/dL (ref 6.1–8.1)

## 2020-02-24 LAB — CBC WITH DIFFERENTIAL/PLATELET
Absolute Monocytes: 476 cells/uL (ref 200–950)
Basophils Absolute: 48 cells/uL (ref 0–200)
Basophils Relative: 0.7 %
Eosinophils Absolute: 186 cells/uL (ref 15–500)
Eosinophils Relative: 2.7 %
HCT: 41.8 % (ref 38.5–50.0)
Hemoglobin: 14.3 g/dL (ref 13.2–17.1)
Lymphs Abs: 1076 cells/uL (ref 850–3900)
MCH: 32.1 pg (ref 27.0–33.0)
MCHC: 34.2 g/dL (ref 32.0–36.0)
MCV: 93.7 fL (ref 80.0–100.0)
MPV: 10.1 fL (ref 7.5–12.5)
Monocytes Relative: 6.9 %
Neutro Abs: 5113 cells/uL (ref 1500–7800)
Neutrophils Relative %: 74.1 %
Platelets: 211 10*3/uL (ref 140–400)
RBC: 4.46 10*6/uL (ref 4.20–5.80)
RDW: 12.7 % (ref 11.0–15.0)
Total Lymphocyte: 15.6 %
WBC: 6.9 10*3/uL (ref 3.8–10.8)

## 2020-02-24 LAB — LIPID PANEL W/REFLEX DIRECT LDL
Cholesterol: 134 mg/dL (ref <200)
HDL: 41 mg/dL (ref >=40)
LDL Cholesterol (Calc): 68 mg/dL (calc)
Non-HDL Cholesterol (Calc): 93 mg/dL (calc) (ref <130)
Total CHOL/HDL Ratio: 3.3 (calc) (ref <5.0)
Triglycerides: 186 mg/dL — ABNORMAL HIGH (ref <150)

## 2020-02-24 LAB — HEPATITIS C ANTIBODY
Hepatitis C Ab: NONREACTIVE
SIGNAL TO CUT-OFF: 0.04 (ref <1.00)

## 2020-02-24 LAB — HEMOGLOBIN A1C
Hgb A1c MFr Bld: 7.7 % of total Hgb — ABNORMAL HIGH (ref <5.7)
Mean Plasma Glucose: 174 (calc)
eAG (mmol/L): 9.7 (calc)

## 2020-03-18 ENCOUNTER — Other Ambulatory Visit: Payer: Self-pay | Admitting: Family Medicine

## 2020-04-01 HISTORY — PX: TOE SURGERY: SHX1073

## 2020-04-05 ENCOUNTER — Other Ambulatory Visit: Payer: Self-pay | Admitting: Family Medicine

## 2020-04-10 ENCOUNTER — Ambulatory Visit: Payer: Medicare Other | Admitting: Podiatry

## 2020-04-13 ENCOUNTER — Ambulatory Visit (INDEPENDENT_AMBULATORY_CARE_PROVIDER_SITE_OTHER): Payer: Medicare Other

## 2020-04-13 ENCOUNTER — Other Ambulatory Visit: Payer: Self-pay

## 2020-04-13 VITALS — BP 130/70 | HR 80 | Temp 97.0°F | Wt 202.2 lb

## 2020-04-13 DIAGNOSIS — Z0000 Encounter for general adult medical examination without abnormal findings: Secondary | ICD-10-CM | POA: Diagnosis not present

## 2020-04-13 NOTE — Progress Notes (Signed)
Subjective:   Walter [name] is a 79 y.o. male who presents for Medicare Annual/Subsequent preventive examination.  Review of Systems     Cardiac Risk Factors include: advanced age (>74men, >17 women);diabetes mellitus;dyslipidemia;male gender;hypertension     Objective:    Today's Vitals   04/13/20 1132  BP: 130/70  Pulse: 80  Temp: (!) 97 F (36.1 C)  SpO2: 97%  Weight: 202 lb 3.2 oz (91.7 kg)   Body mass index is 26.68 kg/m.  Advanced Directives 04/13/2020 11/25/2019 03/11/2019 12/26/2017 09/14/2016 09/13/2016 02/19/2016  Does Patient Have a Medical Advance Directive? Yes Yes Yes Yes Yes Yes Yes  Type of Advance Directive Living will [place];Living will;Out of facility DNR (pink MOST or yellow form) Living will;Healthcare Power of [place];Living will - Living will -  Does patient want to make changes to medical advance directive? - - No - Patient declined No - Patient declined - - -  Copy of [place] in Chart? - - No - copy requested No - copy requested - - No - copy requested  Would patient like information on creating a medical advance directive? - - - - - - -    Current Medications (verified) Outpatient Encounter Medications as of 04/13/2020  Medication Sig   amLODipine (NORVASC) 5 MG tablet TAKE 1 TABLET BY MOUTH EVERY DAY   donepezil (ARICEPT) 5 MG tablet Take 1/2 tablet daily for 2 weeks, then increase to 1 tablet daily (Patient taking differently: Take 5 mg by mouth at bedtime.)   fluticasone (FLONASE) 50 MCG/ACT nasal spray SPRAY 2 SPRAYS INTO EACH NOSTRIL EVERY DAY   glimepiride (AMARYL) 4 MG tablet TAKE 2 TABLETS BY MOUTH DAILY BEFORE BREAKFAST.   JANUVIA 100 MG tablet TAKE 1 TABLET BY MOUTH EVERY DAY   metFORMIN (GLUCOPHAGE) 1000 MG tablet TAKE 1 TABLET BY MOUTH 2 TIMES DAILY WITH A MEAL.   Multiple Vitamins-Minerals (CENTRUM SILVER PO) Take 1 tablet by mouth daily.    ONETOUCH ULTRA test  strip USE ONCE DAILY AS INSTRUCTED   rosuvastatin (CRESTOR) 20 MG tablet Take by mouth.   telmisartan (MICARDIS) 40 MG tablet TAKE 1 TABLET BY MOUTH EVERY DAY   atorvastatin (LIPITOR) 20 MG tablet Take 20 mg by mouth once a week. (Patient not taking: No sig reported)   No facility-administered encounter medications on file as of 04/13/2020.    Allergies (verified) Bactrim [sulfamethoxazole-trimethoprim]   History: Past Medical History:  Diagnosis Date   Allergic rhinitis 08/19/2019   flonase   BPH (benign prostatic hyperplasia) 07/02/2015   S/p TURP with multiple complications afterwards including recurrent hospitalizations. All started after a hemorrhoid surgery and later with urinary retention. Patient at one point was septic from urinary issues.     Complication of anesthesia    bladder problems after surgery from Anesthesia- can not urinate   Diabetes mellitus type II, controlled 04/23/2007    Metformin 1g BID, amaryl 4mg --> 8mg , had to add [place] back  Never took victoza.   Januvia-lethargic and dizzy in past. 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   Sepsis secondary to UTI 03/29/2015   Past Surgical History:  Procedure Laterality Date   AMPUTATION TOE Right 09/14/2016   Procedure: RIGHT 2ND TOE/RAY AMPUTATION;  Surgeon: Leandrew Koyanagi, MD;  Location: [facility];  Service: Orthopedics;  Laterality: Right;    [place] N/A 06/21/2015   Procedure: CYSTOSCOPY, CLOT EVACUATION, AND CAUTERIZATION OF PROSTATE FOSSA;  Surgeon: Carolan Clines, MD;  Location: [facility];  Service: Urology;  Laterality: N/A;   HEMORRHOID SURGERY     TRANSURETHRAL RESECTION OF PROSTATE N/A 05/23/2015   Procedure: TRANSURETHRAL RESECTION OF THE PROSTATE (TURP);  Surgeon: Irine Seal, MD;  Location: [facility];  Service: Urology;  Laterality: N/A;   Family History  Problem Relation Age of Onset   Colon cancer Father 84   Diabetes Father        type I   Alzheimer's disease Mother    Cancer Other        colon/fhx   Rectal cancer Maternal Uncle    Colon cancer Paternal Grandmother 29   Esophageal cancer Neg Hx    Stomach cancer Neg Hx    Social History   Socioeconomic History   Marital status: Married    Spouse name: Not on file   Number of children: Not on file   Years of education: 47   Highest education level: Master's degree (e.g., MA, MS, MEng, MEd, MSW, MBA)  Occupational History   Occupation: Retired  Tobacco Use   Smoking status: Former Smoker    Packs/day: 0.25    Years: 10.00    Pack years: 2.50    Types: Pipe   Smokeless tobacco: Never Used  Scientific laboratory technician Use: Never used  Substance and Sexual Activity   Alcohol use: Yes    Alcohol/week: 2.0 standard drinks    Types: 2 Cans of beer per week    Comment: social   Drug use: No   Sexual activity: Not on file  Other Topics Concern   Not on file  Social History Narrative   Married 1966. 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)      No religious beliefs affecting health care, no afterlife beliefs      Right Handed      Two Story  Home   Social Determinants of Health   Financial Resource Strain: Low Risk    Difficulty of Paying Living Expenses: Not hard at all  Food Insecurity: No Food Insecurity   Worried About Charity fundraiser in the Last Year: Never true   Arboriculturist in the Last Year: Never true  Transportation Needs: No Transportation Needs   Lack of Transportation (Medical): No   Lack of Transportation (Non-Medical): No  Physical Activity: Inactive   Days of Exercise per Week: 0 days   Minutes of Exercise per Session: 0 min  Stress: No Stress Concern Present   Feeling of Stress : Not at all  Social Connections: Moderately  Integrated   Frequency of Communication with Friends and Family: Three times a week   Frequency of Social Gatherings with Friends and Family: Once a week   Attends Religious Services: Never   Marine scientist or Organizations: Yes   Attends Music therapist: 1 to 4 times per year   Marital Status: Married    Tobacco Counseling Counseling given: Not Answered   Clinical Intake:  Pre-visit preparation completed: Yes  Pain : No/denies pain     BMI - recorded: 26.68 Nutritional Status: BMI 25 -29 Overweight Nutritional Risks: Other (Comment) (right foot wound following up with podiatrist) Diabetes: Yes CBG done?: No Did pt. bring in CBG monitor from home?: No  How often do you need to have someone help you when you read instructions, pamphlets, or other written materials from your doctor or pharmacy?: 1 - Never  Diabetic?Nutrition Risk Assessment:  Has the patient had any N/V/D within the last 2 months?  No  Does the patient have any non-healing wounds?  No  Has the patient had any unintentional weight loss or weight gain?  No   Diabetes:  Is the patient diabetic?  Yes  If diabetic, was a CBG obtained today?  Yes  Did the patient bring in their glucometer from home?  No  How often do you monitor your CBG's? Daily.   Financial  Strains and Diabetes Management:  Are you having any financial strains with the device, your supplies or your medication? No .  Does the patient want to be seen by Chronic Care Management for management of their diabetes?  No  Would the patient like to be referred to a Nutritionist or for Diabetic Management?  No   Diabetic Exams:  Diabetic Eye Exam: Completed 01/20/20 Diabetic Foot Exam: Completed 02/20/20   Interpreter Needed?: No  Information entered by :: Charlott Rakes, LPN   Activities of Daily Living In your present state of health, do you have any difficulty performing the following activities: 04/13/2020  Hearing? N  Vision? N  Difficulty concentrating or making decisions? N  Walking or climbing stairs? N  Dressing or bathing? N  Doing errands, shopping? N  Preparing Food and eating ? N  Using the Toilet? N  In the past six months, have you accidently leaked urine? N  Do you have problems with loss of bowel control? N  Managing your Medications? N  Managing your Finances? N  Housekeeping or managing your Housekeeping? N  Some recent data might be hidden    Patient Care Team: [name], MD as PCP - General (Family Medicine) Michael Boston, MD as Consulting Physician (General Surgery) Ladene Artist, MD as Consulting Physician (Gastroenterology) Irine Seal, MD as Attending Physician (Urology) Shon Hough, MD as Consulting Physician (Ophthalmology) Cameron Sprang, MD as Consulting Physician (Neurology)  Indicate any recent Medical Services you may have received from other than [facility] providers in the past year (date may be approximate).     Assessment:   This is a routine wellness examination for Walter [name].  Hearing/Vision screen  Hearing Screening   125Hz  250Hz  500Hz  1000Hz  2000Hz  3000Hz  4000Hz  6000Hz  8000Hz   Right ear:           Left ear:           Comments: Pt denies any hearing issues   Vision Screening Comments: Pt follows Dr Kathrin Penner for  annual eye exams  Dietary issues and exercise activities discussed: Current Exercise Habits: The patient does not  participate in regular exercise at present  Goals     patient     Get A1c down to 6; need to continue exercising; Portion control Fat free or low fat dairy products Fish high in omega-3 acids ( salmon, tuna, trout) Fruits, such as apples, bananas, oranges, pears, prunes Legumes, such as kidney beans, lentils, checkpeas, black-eyed peas and lima beans Vegetables; broccoli, cabbage, carrots Whole grains;   Plant fats are better; decrease "white" foods as pasta, rice, bread and desserts, sugar; Avoid red meat (limiting) palm and coconut oils; sugary foods and beverages  Two nutrients that raise blood chol levels are saturated fats and trans fat; in hydrogenated oils and fats, as stick margarine, baked goods (cookes, cakes, pies, crackers; frosting; and coffee creamers;   Some Fats lower cholesterol: Monounsaturated and polyunsaturated  Avocados Corn, sunflower, and soybean oils Nuts and seeds, such as walnuts Olive, canola, peanut, safflower, and sesame oils Peanut butter Salmon and trout Tofu        Patient Stated     Keep A1c at a manageable level      Depression Screen PHQ 2/9 Scores 04/13/2020 08/19/2019 03/11/2019 02/25/2019 12/26/2017 05/17/2017 03/08/2017  PHQ - 2 Score 0 0 0 0 0 0 0    Fall Risk Fall Risk  04/13/2020 11/25/2019 03/11/2019 02/25/2019 12/26/2017  Falls in the past year? 0 0 0 0 No  Number falls in past yr: 0 0 - 0 -  Injury with Fall? 0 0 0 0 -  Risk for fall due to : - - - - -  Follow up Falls prevention discussed - Falls evaluation completed;Education provided;Falls prevention discussed - -    FALL RISK PREVENTION PERTAINING TO THE HOME:  Any stairs in or around the home? Yes  If so, are there any without handrails? No  Home free of loose throw rugs in walkways, pet beds, electrical cords, etc? Yes  Adequate lighting in your home to reduce  risk of falls? Yes   ASSISTIVE DEVICES UTILIZED TO PREVENT FALLS:  Life alert? No  Use of a cane, walker or w/c? No  Grab bars in the bathroom? Yes  Shower chair or bench in shower? Yes  Elevated toilet seat or a handicapped toilet? No   TIMED UP AND GO:  Was the test performed? Yes .  Length of time to ambulate 10 feet: 10 sec.   Gait steady and fast without use of assistive device  Cognitive Function: MMSE - Mini Mental State Exam 02/19/2016  Not completed: (No Data)     6CIT Screen 04/13/2020 03/11/2019 12/26/2017  What Year? 0 points 0 points 0 points  What month? 0 points 0 points 0 points  What time? - 0 points 0 points  Count back from 20 0 points 0 points 0 points  Months in reverse 0 points 0 points 0 points  Repeat phrase 2 points 0 points -  Total Score - 0 -    Immunizations Immunization History  Administered Date(s) Administered   Fluad Quad(high Dose 65+) 01/08/2019, 01/16/2020   Influenza Split 01/31/2011, 01/16/2012   Influenza Whole 03/07/2007, 01/31/2008, 01/30/2009   Influenza, High Dose Seasonal PF 01/11/2017, 02/05/2018   Influenza,inj,Quad PF,6+ Mos 01/21/2013, 01/27/2014, 12/26/2014   Influenza-Unspecified 01/05/2016   PFIZER SARS-COV-2 Vaccination 05/24/2019, 06/14/2019, 02/02/2020   Pneumococcal Conjugate-13 08/19/2013   Pneumococcal Polysaccharide-23 04/04/2007   Td 04/04/2007, 09/14/2017   Varicella 08/19/2013    TDAP status: Up to date  Flu Vaccine status: Up to date  50  01/16/20 Pneumococcal vaccine status: Up to date  Covid-19 vaccine status: Completed vaccines  Qualifies for Shingles Vaccine? Yes   Zostavax completed Yes   Shingrix Completed?: No.    Education has been provided regarding the importance of this vaccine. Patient has been advised to call insurance company to determine out of pocket expense if they have not yet received this vaccine. Advised may also receive vaccine at local pharmacy or Health Dept.  Verbalized acceptance and understanding.  Screening Tests Health Maintenance  Topic Date Due   HEMOGLOBIN A1C  08/20/2020   OPHTHALMOLOGY EXAM  01/19/2021   FOOT EXAM  02/19/2021   TETANUS/TDAP  09/15/2027   INFLUENZA VACCINE  Completed   COVID-19 Vaccine  Completed   Hepatitis C Screening  Completed   PNA vac Low Risk Adult  Completed    Health Maintenance  There are no preventive care reminders to display for this patient.  Colorectal cancer screening: No longer required.     Additional Screening:  Hepatitis C Screening: Completed 02/20/20  Vision Screening: Recommended annual ophthalmology exams for early detection of glaucoma and other disorders of the eye. Is the patient up to date with their annual eye exam?  Yes  Who is the provider or what is the name of the office in which the patient attends annual eye exams? Dr Kathrin Penner   Dental Screening: Recommended annual dental exams for proper oral hygiene  Community Resource Referral / Chronic Care Management: CRR required this visit?  No   CCM required this visit?  No      Plan:     I have personally reviewed and noted the following in the patients chart:    Medical and social history  Use of alcohol, tobacco or illicit drugs   Current medications and supplements  Functional ability and status  Nutritional status  Physical activity  Advanced directives  List of other physicians  Hospitalizations, surgeries, and ER visits in previous 12 months  Vitals  Screenings to include cognitive, depression, and falls  Referrals and appointments  In addition, I have reviewed and discussed with patient certain preventive protocols, quality metrics, and best practice recommendations. A written personalized care plan for preventive services as well as general preventive health recommendations were provided to patient.     Willette Brace, LPN   07/62/2633   Nurse Notes: None

## 2020-04-13 NOTE — Patient Instructions (Addendum)
[name] , Thank you for taking time to come for your Medicare Wellness Visit. I appreciate your ongoing commitment to your health goals. Please review the following plan we discussed and let me know if I can assist you in the future.   Screening recommendations/referrals: Colonoscopy: Done 11/18/19 Recommended yearly ophthalmology/optometry visit for glaucoma screening and checkup Recommended yearly dental visit for hygiene and checkup  Vaccinations: Influenza vaccine: Done 01/16/20 Up to date Pneumococcal vaccine: Up to date Tdap vaccine: Up to date Shingles vaccine: Shingrix discussed. Please contact your pharmacy for coverage information.    Covid-19: Completed 1/22, 2/12, & 02/02/20  Advanced directives: Please bring a copy of your health care power of attorney and living will to the office at your convenience.  Conditions/risks identified: Keep A1C at a manageable level  Next appointment: Follow up in one year for your annual wellness visit.   Preventive Care 28 Years and Older, Male Preventive care refers to lifestyle choices and visits with your health care provider that can promote health and wellness. What does preventive care include?  A yearly physical exam. This is also called an annual well check.  Dental exams once or twice a year.  Routine eye exams. Ask your health care provider how often you should have your eyes checked.  Personal lifestyle choices, including:  Daily care of your teeth and gums.  Regular physical activity.  Eating a healthy diet.  Avoiding tobacco and drug use.  Limiting alcohol use.  Practicing safe sex.  Taking low doses of aspirin every day.  Taking vitamin and mineral supplements as recommended by your health care provider. What happens during an annual well check? The services and screenings done by your health care provider during your annual well check will depend on your age, overall health, lifestyle risk factors, and family  history of disease. Counseling  Your health care provider may ask you questions about your:  Alcohol use.  Tobacco use.  Drug use.  Emotional well-being.  Home and relationship well-being.  Sexual activity.  Eating habits.  History of falls.  Memory and ability to understand (cognition).  Work and work Statistician. Screening  You may have the following tests or measurements:  Height, weight, and BMI.  Blood pressure.  Lipid and cholesterol levels. These may be checked every 5 years, or more frequently if you are over 43 years old.  Skin check.  Lung cancer screening. You may have this screening every year starting at age 46 if you have a 30-pack-year history of smoking and currently smoke or have quit within the past 15 years.  Fecal occult blood test (FOBT) of the stool. You may have this test every year starting at age 72.  Flexible sigmoidoscopy or colonoscopy. You may have a sigmoidoscopy every 5 years or a colonoscopy every 10 years starting at age 65.  Prostate cancer screening. Recommendations will vary depending on your family history and other risks.  Hepatitis C blood test.  Hepatitis B blood test.  Sexually transmitted disease (STD) testing.  Diabetes screening. This is done by checking your blood sugar (glucose) after you have not eaten for a while (fasting). You may have this done every 1-3 years.  Abdominal aortic aneurysm (AAA) screening. You may need this if you are a current or former smoker.  Osteoporosis. You may be screened starting at age 22 if you are at high risk. Talk with your health care provider about your test results, treatment options, and if necessary, the need for  more tests. Vaccines  Your health care provider may recommend certain vaccines, such as:  Influenza vaccine. This is recommended every year.  Tetanus, diphtheria, and acellular pertussis (Tdap, Td) vaccine. You may need a Td booster every 10 years.  Zoster vaccine.  You may need this after age 48.  Pneumococcal 13-valent conjugate (PCV13) vaccine. One dose is recommended after age 34.  Pneumococcal polysaccharide (PPSV23) vaccine. One dose is recommended after age 28. Talk to your health care provider about which screenings and vaccines you need and how often you need them. This information is not intended to replace advice given to you by your health care provider. Make sure you discuss any questions you have with your health care provider. Document Released: 05/15/2015 Document Revised: 01/06/2016 Document Reviewed: 02/17/2015 Elsevier Interactive Patient Education  2017 [place] Prevention in the Home Falls can cause injuries. They can happen to people of all ages. There are many things you can do to make your home safe and to help prevent falls. What can I do on the outside of my home?  Regularly fix the edges of walkways and driveways and fix any cracks.  Remove anything that might make you trip as you walk through a door, such as a raised step or threshold.  Trim any bushes or trees on the path to your home.  Use bright outdoor lighting.  Clear any walking paths of anything that might make someone trip, such as rocks or tools.  Regularly check to see if handrails are loose or broken. Make sure that both sides of any steps have handrails.  Any raised decks and porches should have guardrails on the edges.  Have any leaves, snow, or ice cleared regularly.  Use sand or salt on walking paths during winter.  Clean up any spills in your garage right away. This includes oil or grease spills. What can I do in the bathroom?  Use night lights.  Install grab bars by the toilet and in the tub and shower. Do not use towel bars as grab bars.  Use non-skid mats or decals in the tub or shower.  If you need to sit down in the shower, use a plastic, non-slip stool.  Keep the floor dry. Clean up any water that spills on the floor as soon  as it happens.  Remove soap buildup in the tub or shower regularly.  Attach bath mats securely with double-sided non-slip rug tape.  Do not have throw rugs and other things on the floor that can make you trip. What can I do in the bedroom?  Use night lights.  Make sure that you have a light by your bed that is easy to reach.  Do not use any sheets or blankets that are too big for your bed. They should not hang down onto the floor.  Have a firm chair that has side arms. You can use this for support while you get dressed.  Do not have throw rugs and other things on the floor that can make you trip. What can I do in the kitchen?  Clean up any spills right away.  Avoid walking on wet floors.  Keep items that you use a lot in easy-to-reach places.  If you need to reach something above you, use a strong step stool that has a grab bar.  Keep electrical cords out of the way.  Do not use floor polish or wax that makes floors slippery. If you must use wax, use non-skid  floor wax.  Do not have throw rugs and other things on the floor that can make you trip. What can I do with my stairs?  Do not leave any items on the stairs.  Make sure that there are handrails on both sides of the stairs and use them. Fix handrails that are broken or loose. Make sure that handrails are as long as the stairways.  Check any carpeting to make sure that it is firmly attached to the stairs. Fix any carpet that is loose or worn.  Avoid having throw rugs at the top or bottom of the stairs. If you do have throw rugs, attach them to the floor with carpet tape.  Make sure that you have a light switch at the top of the stairs and the bottom of the stairs. If you do not have them, ask someone to add them for you. What else can I do to help prevent falls?  Wear shoes that:  Do not have high heels.  Have rubber bottoms.  Are comfortable and fit you well.  Are closed at the toe. Do not wear sandals.  If  you use a stepladder:  Make sure that it is fully opened. Do not climb a closed stepladder.  Make sure that both sides of the stepladder are locked into place.  Ask someone to hold it for you, if possible.  Clearly mark and make sure that you can see:  Any grab bars or handrails.  First and last steps.  Where the edge of each step is.  Use tools that help you move around (mobility aids) if they are needed. These include:  Canes.  Walkers.  Scooters.  Crutches.  Turn on the lights when you go into a dark area. Replace any light bulbs as soon as they burn out.  Set up your furniture so you have a clear path. Avoid moving your furniture around.  If any of your floors are uneven, fix them.  If there are any pets around you, be aware of where they are.  Review your medicines with your doctor. Some medicines can make you feel dizzy. This can increase your chance of falling. Ask your doctor what other things that you can do to help prevent falls. This information is not intended to replace advice given to you by your health care provider. Make sure you discuss any questions you have with your health care provider. Document Released: 02/12/2009 Document Revised: 09/24/2015 Document Reviewed: 05/23/2014 Elsevier Interactive Patient Education  2017 [employer].

## 2020-04-14 ENCOUNTER — Ambulatory Visit: Payer: Medicare Other | Admitting: Podiatry

## 2020-04-14 ENCOUNTER — Encounter: Payer: Self-pay | Admitting: Podiatry

## 2020-04-14 ENCOUNTER — Ambulatory Visit (INDEPENDENT_AMBULATORY_CARE_PROVIDER_SITE_OTHER): Payer: Medicare Other

## 2020-04-14 DIAGNOSIS — M24173 Other articular cartilage disorders, unspecified ankle: Secondary | ICD-10-CM

## 2020-04-14 DIAGNOSIS — M249 Joint derangement, unspecified: Secondary | ICD-10-CM | POA: Diagnosis not present

## 2020-04-14 DIAGNOSIS — M24176 Other articular cartilage disorders, unspecified foot: Secondary | ICD-10-CM

## 2020-04-14 DIAGNOSIS — M79672 Pain in left foot: Secondary | ICD-10-CM

## 2020-04-14 DIAGNOSIS — E119 Type 2 diabetes mellitus without complications: Secondary | ICD-10-CM

## 2020-04-14 DIAGNOSIS — M2011 Hallux valgus (acquired), right foot: Secondary | ICD-10-CM

## 2020-04-14 DIAGNOSIS — M2041 Other hammer toe(s) (acquired), right foot: Secondary | ICD-10-CM

## 2020-04-14 MED ORDER — OXYCODONE-ACETAMINOPHEN 5-325 MG PO TABS
1.0000 | ORAL_TABLET | ORAL | 0 refills | Status: DC | PRN
Start: 2020-04-29 — End: 2020-06-25

## 2020-04-14 MED ORDER — ONDANSETRON HCL 4 MG PO TABS: 4.0000 mg | ORAL_TABLET | Freq: Three times a day (TID) | ORAL | 0 refills | Status: DC | PRN

## 2020-04-14 MED ORDER — CEPHALEXIN 500 MG PO CAPS: 500.0000 mg | ORAL_CAPSULE | Freq: Two times a day (BID) | ORAL | 0 refills | Status: DC

## 2020-04-14 MED ORDER — ENOXAPARIN SODIUM 30 MG/0.3ML ~~LOC~~ SOLN: 30.0000 mg | Freq: Two times a day (BID) | SUBCUTANEOUS | 0 refills | Status: DC

## 2020-04-14 NOTE — Progress Notes (Signed)
  Subjective:  Patient ID: Walter [name], male    DOB: 01/15/1941,  MRN: [id number]  Chief Complaint  Patient presents with  . Hammer Toe    Right foot hammertoe  . Blister    Blister right 4th toe, recent drainage but has resolved  . Toe Pain    Left 1st toe turning inward   79 y.o. male presents with the above complaint. History confirmed with patient. Would like to consider surgery again for his bunion.  DM, last saw his PCP recently. Last A1c 6.7  Objective:  Physical Exam: warm, good capillary refill, no trophic changes or ulcerative lesions, normal DP and PT pulses and normal sensory exam. Left Foot: mild HAV, digital contractures noted  Right Foot: Severe HAV noted with submet 1 callus. Amputation of 2nd toe noted. Digital contractures lesser digits. Right 4th PIPJ callus formation  No images are attached to the encounter.  Radiographs: X-ray of the right foot: partial 2nd ray resection, severe HAV deformity, 3rd MPJ subluxation Assessment:   1. Hallux valgus (acquired), right foot   2. Hammer toe of right foot   3. Joint derangement of ankle or foot   4. Controlled type 2 diabetes mellitus without complication, without long-term current use of insulin ([place])    Plan:  Patient was evaluated and treated and all questions answered.  HAV, hammertoes right foot -XR reviewed with patient -Educated on etiology of deformity -Discussed proper shoe gear modifications and padding  -Patient has failed all conservative therapy and wishes to proceed with surgical intervention. All risks, benefits, and alternatives discussed with patient. Specifically discussed DM related risks including infection. No guarantees given. Discussed possible likelihood of lesser digit subluxation recurrence. Consent reviewed and signed by patient. -Planned procedures: Right 1st MPJ fusion, correction hammertoes 2,3,4,5 with pin fixation -Identified risk factors: DM, well controlled. -DME dispensed for  post-op use: CAM Boot  -Rx sent to pharmacy: Percocet, keflex, zofran, lovenox  No follow-ups on file.

## 2020-04-15 ENCOUNTER — Other Ambulatory Visit: Payer: Self-pay | Admitting: Podiatry

## 2020-04-15 DIAGNOSIS — M2011 Hallux valgus (acquired), right foot: Secondary | ICD-10-CM

## 2020-04-20 ENCOUNTER — Telehealth: Payer: Self-pay

## 2020-04-20 NOTE — Telephone Encounter (Signed)
DOS 04/29/2020   [employer] MEDICARE EFFECTIVE DATE - 01/01/2020  PLAN DEDUCTIBLE - $0.00 OUT OF POCKET - $4000.00 W/ $3419.06 REMAINING  CO-INSURANCE 0% / Day OUTPATIENT SURGERY 0% / [place] $200 / Day OUTPATIENT SURGERY $200 / [place]  Notification or Prior Authorization is not required for the requested services  This [employer] Medicare Advantage members plan does not currently require a prior authorization for these services. If you have general questions about the prior authorization requirements, please call us at [phone] or visit [email or web address] > Clinician Resources > Advance and Admission Notification Requirements. The number above acknowledges your notification. Please write this number down for future reference. Notification is not a guarantee of coverage or payment.  Decision ID #:[id number]

## 2020-04-22 ENCOUNTER — Other Ambulatory Visit: Payer: Self-pay | Admitting: Podiatry

## 2020-04-22 DIAGNOSIS — M2011 Hallux valgus (acquired), right foot: Secondary | ICD-10-CM

## 2020-04-22 NOTE — Progress Notes (Signed)
Order placed for knee scooter

## 2020-04-28 ENCOUNTER — Telehealth: Payer: Self-pay | Admitting: Podiatry

## 2020-04-28 NOTE — Telephone Encounter (Signed)
Pt's wife called wanting to know if she would get instructions on how to do his injections. She would also like to know when his injections will begin, and if he has to wear his boot at night. I have advised the wife that Dr. Samuella Cota will walk her through the injection process at the surgery center, and he doesn't have to wear the boot at night.

## 2020-04-28 NOTE — Telephone Encounter (Signed)
Called patient's wife and all issues addressed.

## 2020-04-29 ENCOUNTER — Encounter: Payer: Self-pay | Admitting: Podiatry

## 2020-04-29 ENCOUNTER — Telehealth: Payer: Self-pay | Admitting: Podiatry

## 2020-04-29 DIAGNOSIS — M2041 Other hammer toe(s) (acquired), right foot: Secondary | ICD-10-CM | POA: Diagnosis not present

## 2020-04-29 NOTE — Telephone Encounter (Signed)
Please call patients wife, her husband had stumbled in the bathroom after getting sx today and wife is concerned since he has a block. Please call [facility]

## 2020-05-01 ENCOUNTER — Other Ambulatory Visit: Payer: Self-pay | Admitting: Family Medicine

## 2020-05-02 HISTORY — PX: MOLE REMOVAL: SHX2046

## 2020-05-05 ENCOUNTER — Encounter: Payer: Medicare Other | Admitting: Podiatry

## 2020-05-06 ENCOUNTER — Other Ambulatory Visit: Payer: Self-pay

## 2020-05-06 ENCOUNTER — Ambulatory Visit (INDEPENDENT_AMBULATORY_CARE_PROVIDER_SITE_OTHER): Payer: Medicare Other

## 2020-05-06 ENCOUNTER — Ambulatory Visit (INDEPENDENT_AMBULATORY_CARE_PROVIDER_SITE_OTHER): Payer: Medicare Other | Admitting: Podiatry

## 2020-05-06 DIAGNOSIS — M249 Joint derangement, unspecified: Secondary | ICD-10-CM

## 2020-05-06 DIAGNOSIS — Z9889 Other specified postprocedural states: Secondary | ICD-10-CM

## 2020-05-06 DIAGNOSIS — M24173 Other articular cartilage disorders, unspecified ankle: Secondary | ICD-10-CM

## 2020-05-06 DIAGNOSIS — M2041 Other hammer toe(s) (acquired), right foot: Secondary | ICD-10-CM

## 2020-05-06 DIAGNOSIS — M2011 Hallux valgus (acquired), right foot: Secondary | ICD-10-CM | POA: Diagnosis not present

## 2020-05-06 DIAGNOSIS — M24176 Other articular cartilage disorders, unspecified foot: Secondary | ICD-10-CM

## 2020-05-07 ENCOUNTER — Encounter: Payer: Self-pay | Admitting: Podiatry

## 2020-05-07 NOTE — Progress Notes (Signed)
Subjective:  Patient ID: Walter [name], male    DOB: 1941/03/03,  MRN: [id number]  Chief Complaint  Patient presents with  . Routine Post Op    Pt stated that he is doing well he has no concerns and no major pain at this time.     DOS: 04/29/2020 Procedure: Right first MPJ fusion with correction of hammertoes 2 through 5 with pin fixation  80 y.o. male returns for post-op check.  Patient is doing well.  No acute pain.  Dressings have been clean dry and intact.  Patient has been nonweightbearing to the right lower extremity.  Review of Systems: Negative except as noted in the HPI. Denies N/V/F/Ch.  Past Medical History:  Diagnosis Date  . Allergic rhinitis 08/19/2019   flonase  . BPH (benign prostatic hyperplasia) 07/02/2015   S/p TURP with multiple complications afterwards including recurrent hospitalizations. All started after a hemorrhoid surgery and later with urinary retention. Patient at one point was septic from urinary issues.    . Complication of anesthesia    bladder problems after surgery from Anesthesia- can not urinate  . Diabetes mellitus type II, controlled 04/23/2007    Metformin 1g BID, amaryl 4mg --> 8mg , had to add back  Never took victoza.   Januvia-lethargic and dizzy in past. Retrial ok; could change to victoza if needed Serious UTI history- likely avoid sglt2 inhibtor  . History of colon cancer    Tubular adenoma of colon; s/p colectomy 1992   . 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 History of shingles   . Hyperlipidemia associated with type 2 diabetes mellitus 04/23/2007   Atorvastatin 20mg  once weekly    . Hypertension associated with diabetes 04/23/2007   Amlodipine 5mg , telmisartan 40mg     . Mild neurocognitive disorder 12/23/2019  . 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 Obstructive sleep apnea 04/23/2007   CPAP   . 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 Sepsis secondary to UTI 03/29/2015    Current Outpatient Medications:  .  amLODipine  (NORVASC) 5 MG tablet, TAKE 1 TABLET BY MOUTH EVERY DAY, Disp: 90 tablet, Rfl: 3 .  atorvastatin (LIPITOR) 20 MG tablet, Take 20 mg by mouth once a week., Disp: , Rfl:  .  cephALEXin (KEFLEX) 500 MG capsule, Take 1 capsule (500 mg total) by mouth 2 (two) times daily., Disp: 14 capsule, Rfl: 0 .  donepezil (ARICEPT) 5 MG tablet, Take 1/2 tablet daily for 2 weeks, then increase to 1 tablet daily (Patient taking differently: Take 5 mg by mouth at bedtime.), Disp: 30 tablet, Rfl: 11 .  enoxaparin (LOVENOX) 30 MG/0.3ML injection, Inject 0.3 mLs (30 mg total) into the skin every 12 (twelve) hours., Disp: 16.8 mL, Rfl: 0 .  fluticasone (FLONASE) 50 MCG/ACT nasal spray, SPRAY 2 SPRAYS INTO EACH NOSTRIL EVERY DAY, Disp: 48 mL, Rfl: 1 .  glimepiride (AMARYL) 4 MG tablet, TAKE 2 TABLETS BY MOUTH DAILY BEFORE BREAKFAST., Disp: 180 tablet, Rfl: 1 .  JANUVIA 100 MG tablet, TAKE 1 TABLET BY MOUTH EVERY DAY, Disp: 90 tablet, Rfl: 1 .  metFORMIN (GLUCOPHAGE) 1000 MG tablet, TAKE 1 TABLET BY MOUTH 2 TIMES DAILY WITH A MEAL., Disp: 180 tablet, Rfl: 3 .  Multiple Vitamins-Minerals (CENTRUM SILVER PO), Take 1 tablet by mouth daily. , Disp: , Rfl:  .  ondansetron (ZOFRAN) 4 MG tablet, Take 1 tablet (4 mg total) by mouth every 8 (eight) hours as needed for nausea or vomiting., Disp: 20 tablet, Rfl: 0 .  ONETOUCH ULTRA test strip, USE ONCE DAILY AS INSTRUCTED, Disp: 100 strip, Rfl: 3 .  oxyCODONE-acetaminophen (PERCOCET) 5-325 MG tablet, Take 1 tablet by mouth every 4 (four) hours as needed for severe pain., Disp: 20 tablet, Rfl: 0 .  rosuvastatin (CRESTOR) 20 MG tablet, Take by mouth., Disp: , Rfl:  .  telmisartan (MICARDIS) 40 MG tablet, TAKE 1 TABLET BY MOUTH EVERY DAY, Disp: 90 tablet, Rfl: 3  Social History   Tobacco Use  Smoking Status Former Smoker  . Packs/day: 0.25  . Years: 10.00  . Pack years: 2.50  . Types: Pipe  Smokeless Tobacco Never Used    Allergies  Allergen Reactions  . Bactrim  [Sulfamethoxazole-Trimethoprim] Nausea And Vomiting    Pt has chills and fevers also.   Objective:  There were no vitals filed for this visit. There is no height or weight on file to calculate BMI. Constitutional Well developed. Well nourished.  Vascular Foot warm and well perfused. Capillary refill normal to all digits.   Neurologic Normal speech. Oriented to person, place, and time. Epicritic sensation to light touch grossly present bilaterally.  Dermatologic Skin healing well without signs of infection. Skin edges well coapted without signs of infection.  Orthopedic: Tenderness to palpation noted about the surgical site.   Radiographs: 3 views of skeletally mature right foot: Hardware is intact no signs of loosening or backing out.  No breaking noted.  Good correction alignment noted Assessment:   1. Hallux valgus (acquired), right foot   2. Hammer toe of right foot   3. Joint derangement of ankle or foot   4. Status post foot surgery    Plan:  Patient was evaluated and treated and all questions answered.  S/p foot surgery right -Progressing as expected post-operatively. -XR: See above -WB Status: Nonweightbearing to the right lower extremity in knee scooter -Sutures: Intact.  No signs of dehiscence noted.  No complications noted. -Medications: None -Foot redressed.  No follow-ups on file.

## 2020-05-12 ENCOUNTER — Other Ambulatory Visit: Payer: Self-pay

## 2020-05-12 ENCOUNTER — Ambulatory Visit (INDEPENDENT_AMBULATORY_CARE_PROVIDER_SITE_OTHER): Payer: Medicare Other | Admitting: Podiatry

## 2020-05-12 DIAGNOSIS — Z9889 Other specified postprocedural states: Secondary | ICD-10-CM

## 2020-05-12 DIAGNOSIS — M2011 Hallux valgus (acquired), right foot: Secondary | ICD-10-CM

## 2020-05-12 DIAGNOSIS — M2041 Other hammer toe(s) (acquired), right foot: Secondary | ICD-10-CM

## 2020-05-12 NOTE — Progress Notes (Signed)
  Subjective:  Patient ID: Walter [name], male    DOB: 1940/11/27,  MRN: [id number]  Chief Complaint  Patient presents with  . Routine Post Op    POV#2 DOS 12.29.2021 Pt denies fever/chills/nausea/vomiting and states no acute concerns.     DOS: 04/29/20 Procedure: Right foot 1st MPJ fusion, correction hammertoes 3rd/4th toes   80 y.o. male presents with the above complaint. History confirmed with patient. Denies post-op issues.  Objective:  Physical Exam: no tenderness at the surgical site, local edema noted and calf supple, nontender. Incision: healing well, no significant drainage, no dehiscence, no significant erythema  Assessment:   1. Hallux valgus (acquired), right foot   2. Hammer toe of right foot   3. Post-operative state     Plan:  Patient was evaluated and treated and all questions answered.  Post-operative State -Dressing applied consisting of sterile gauze, kerlix and ACE bandage -Last week's post-op XR reviewed with patient  -Continue NWB -Plan for suture removal next week.  Return in about 1 week (around 05/19/2020).

## 2020-05-22 ENCOUNTER — Other Ambulatory Visit: Payer: Self-pay

## 2020-05-22 ENCOUNTER — Ambulatory Visit (INDEPENDENT_AMBULATORY_CARE_PROVIDER_SITE_OTHER): Payer: Medicare Other | Admitting: Podiatry

## 2020-05-22 DIAGNOSIS — Z9889 Other specified postprocedural states: Secondary | ICD-10-CM

## 2020-05-22 DIAGNOSIS — M2011 Hallux valgus (acquired), right foot: Secondary | ICD-10-CM

## 2020-05-22 DIAGNOSIS — M2041 Other hammer toe(s) (acquired), right foot: Secondary | ICD-10-CM

## 2020-05-22 NOTE — Progress Notes (Signed)
°  Subjective:  Patient ID: Walter [name], male    DOB: 08/01/1940,  MRN: [id number]  Chief Complaint  Patient presents with   Routine Post Op    POV Denies fever/nausea/vomiting/chills. No new concerns.    DOS: 04/29/20 Procedure: Right foot 1st MPJ fusion, correction hammertoes 3rd/4th toes   80 y.o. male presents with the above complaint. History confirmed with patient. Denies post-op issues or concerns.  Objective:  Physical Exam: no tenderness at the surgical site, local edema noted and calf supple, nontender. Incision: healing well, no significant drainage, no dehiscence, no significant erythema. Toes warm and viable with capillary refill. Slight blistering of the 1st metatarsal wound, with sanguinous drainage noted. No warmth, erythema, signs of acute infection.  Assessment:   1. Hallux valgus (acquired), right foot   2. Hammer toe of right foot   3. Post-operative state     Plan:  Patient was evaluated and treated and all questions answered.  Post-operative State -Continue NWB -Sutures removed today. -Dressing applied consisting of sterile gauze, kerlix and ACE bandage  -f/u in 2 weeks for repeat XR, possible transition to WB in the boot.  No follow-ups on file.

## 2020-05-26 ENCOUNTER — Encounter: Payer: Medicare Other | Admitting: Podiatry

## 2020-06-04 ENCOUNTER — Ambulatory Visit: Payer: Medicare Other | Admitting: Family Medicine

## 2020-06-05 ENCOUNTER — Ambulatory Visit (INDEPENDENT_AMBULATORY_CARE_PROVIDER_SITE_OTHER): Payer: Medicare Other | Admitting: Podiatry

## 2020-06-05 ENCOUNTER — Other Ambulatory Visit: Payer: Self-pay

## 2020-06-05 ENCOUNTER — Ambulatory Visit (INDEPENDENT_AMBULATORY_CARE_PROVIDER_SITE_OTHER): Payer: Medicare Other

## 2020-06-05 DIAGNOSIS — Z9889 Other specified postprocedural states: Secondary | ICD-10-CM

## 2020-06-05 DIAGNOSIS — M2011 Hallux valgus (acquired), right foot: Secondary | ICD-10-CM

## 2020-06-05 DIAGNOSIS — M2041 Other hammer toe(s) (acquired), right foot: Secondary | ICD-10-CM | POA: Diagnosis not present

## 2020-06-05 NOTE — Progress Notes (Signed)
  Subjective:  Patient ID: Walter [name], male    DOB: 01/28/1941,  MRN: [id number]  Chief Complaint  Patient presents with  . Routine Post Op    POV#5 -pt denies N/V/F/Ch -dressing dry clean and intact -pt deneis pain/numbness -pt states," been doing fine.:" -pt deneis concerns rx: boot, elevation and icing    DOS: 04/29/20 Procedure: Right foot 1st MPJ fusion, correction hammertoes 3rd/4th toes   80 y.o. male presents with the above complaint. History confirmed with patient. Denies post-op issues or concerns.  Objective:  Physical Exam: no tenderness at the surgical site, local edema noted and calf supple, nontender. Toes rectus. Incision: well healed incision with only slight scabbing.  Assessment:   1. Post-operative state   2. Hallux valgus (acquired), right foot   3. Hammer toe of right foot     Plan:  Patient was evaluated and treated and all questions answered.  Post-operative State -New XR show progressive healing with full bridging of the arthrodeses. -Pins pulled -Transition to WBAT in CAM boot -Dispense surgical shoe for driving only. -New XRs in 2 weeks then plan for WBAT in surgical shoe.  Return in about 2 weeks (around 06/19/2020) for Post-Op (with XRs).

## 2020-06-19 ENCOUNTER — Ambulatory Visit (INDEPENDENT_AMBULATORY_CARE_PROVIDER_SITE_OTHER): Payer: Medicare Other

## 2020-06-19 ENCOUNTER — Ambulatory Visit (INDEPENDENT_AMBULATORY_CARE_PROVIDER_SITE_OTHER): Payer: Medicare Other | Admitting: Podiatry

## 2020-06-19 ENCOUNTER — Other Ambulatory Visit: Payer: Self-pay

## 2020-06-19 DIAGNOSIS — M2041 Other hammer toe(s) (acquired), right foot: Secondary | ICD-10-CM

## 2020-06-19 DIAGNOSIS — M2011 Hallux valgus (acquired), right foot: Secondary | ICD-10-CM

## 2020-06-19 DIAGNOSIS — Z9889 Other specified postprocedural states: Secondary | ICD-10-CM

## 2020-06-19 DIAGNOSIS — M79671 Pain in right foot: Secondary | ICD-10-CM

## 2020-06-24 ENCOUNTER — Other Ambulatory Visit: Payer: Self-pay | Admitting: Podiatry

## 2020-06-24 DIAGNOSIS — M2011 Hallux valgus (acquired), right foot: Secondary | ICD-10-CM

## 2020-06-25 ENCOUNTER — Ambulatory Visit: Payer: Medicare Other | Admitting: Family Medicine

## 2020-06-25 ENCOUNTER — Encounter: Payer: Self-pay | Admitting: Family Medicine

## 2020-06-25 ENCOUNTER — Other Ambulatory Visit: Payer: Self-pay

## 2020-06-25 VITALS — BP 128/70 | HR 75 | Temp 98.1°F | Ht 73.0 in | Wt 201.6 lb

## 2020-06-25 DIAGNOSIS — E785 Hyperlipidemia, unspecified: Secondary | ICD-10-CM

## 2020-06-25 DIAGNOSIS — E119 Type 2 diabetes mellitus without complications: Secondary | ICD-10-CM

## 2020-06-25 DIAGNOSIS — E1159 Type 2 diabetes mellitus with other circulatory complications: Secondary | ICD-10-CM | POA: Diagnosis not present

## 2020-06-25 DIAGNOSIS — E1169 Type 2 diabetes mellitus with other specified complication: Secondary | ICD-10-CM | POA: Diagnosis not present

## 2020-06-25 DIAGNOSIS — I152 Hypertension secondary to endocrine disorders: Secondary | ICD-10-CM

## 2020-06-25 LAB — POCT GLYCOSYLATED HEMOGLOBIN (HGB A1C): Hemoglobin A1C: 7 % — AB (ref 4.0–5.6)

## 2020-06-25 MED ORDER — GLIPIZIDE 10 MG PO TABS: 10.0000 mg | ORAL_TABLET | Freq: Two times a day (BID) | ORAL | 3 refills | Status: DC

## 2020-06-25 NOTE — Patient Instructions (Addendum)
Stop glimepiride and start glipizide 10 mg twice daily once you finish glimepiride   Congrats on a1c of 7 today! Hopefully when foot heals you will get this even lower with exercise  No other changes today

## 2020-06-25 NOTE — Progress Notes (Signed)
Phone [phone] In person visit   Subjective:   Walter [name] is a 80 y.o. year old very pleasant male patient who presents for/with See problem oriented charting Chief Complaint  Patient presents with  . Diabetes  . Hypertension  . Hyperlipidemia  . Medication Refill    Flonase nasal spray     This visit occurred during the SARS-CoV-2 public health emergency.  Safety protocols were in place, including screening questions prior to the visit, additional usage of staff PPE, and extensive cleaning of exam room while observing appropriate contact time as indicated for disinfecting solutions.   Past Medical History-  Patient Active Problem List   Diagnosis Date Noted  . 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    Priority: High  . BPH (benign prostatic hyperplasia) 07/02/2015    Priority: High  . Diabetes mellitus type II, controlled 04/23/2007    Priority: High  . History of colon cancer 01/27/2014    Priority: Medium  . Hyperlipidemia associated with type 2 diabetes mellitus 04/23/2007    Priority: Medium  . Obstructive sleep apnea 04/23/2007    Priority: Medium  . Hypertension associated with diabetes 04/23/2007    Priority: Medium  . 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    Priority: Low  . Hallux valgus of right foot 03/26/2014    Priority: Low  . Iritis 12/17/2008    Priority: Low  . Postherpetic neuralgia 05/22/2008    Priority: Low  . 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 Bigeminy 04/21/2008    Priority: Low  . Allergic rhinitis 08/19/2019  . 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 amLODipine (NORVASC) 5 MG tablet TAKE 1 TABLET BY MOUTH EVERY DAY 90 tablet 3  . donepezil (ARICEPT) 5 MG tablet Take 1/2 tablet daily for 2 weeks, then increase to 1 tablet  daily (Patient taking differently: Take 5 mg by mouth at bedtime.) 30 tablet 11  . fluticasone (FLONASE) 50 MCG/ACT nasal spray SPRAY 2 SPRAYS INTO EACH NOSTRIL EVERY DAY 48 mL 1  . glipiZIDE (GLUCOTROL) 10 MG tablet Take 1 tablet (10 mg total) by mouth 2 (two) times daily before a meal. 180 tablet 3  . JANUVIA 100 MG tablet TAKE 1 TABLET BY MOUTH EVERY DAY 90 tablet 1  . metFORMIN (GLUCOPHAGE) 1000 MG tablet TAKE 1 TABLET BY MOUTH 2 TIMES DAILY WITH A MEAL. 180 tablet 3  . Multiple Vitamins-Minerals (CENTRUM SILVER PO) Take 1 tablet by mouth daily.     Glory Rosebush ULTRA test strip USE ONCE DAILY AS INSTRUCTED 100 strip 3  . rosuvastatin (CRESTOR) 20 MG tablet Take by mouth.    . telmisartan (MICARDIS) 40 MG tablet TAKE 1 TABLET BY MOUTH EVERY DAY 90 tablet 3   No current facility-administered medications for this visit.     Objective:  BP 128/70 Comment: last check at home. today higher but [employer] acct compromised!  Pulse 75   Temp 98.1 F (36.7 C) (Temporal)   Ht 6\' 1"  (1.854 m)   Wt 201 lb 9.6 oz (91.4 kg)   SpO2 95%   BMI 26.60 kg/m  Gen: NAD, resting comfortably CV: RRR no murmurs rubs or gallops Lungs: CTAB no crackles, wheeze, rhonchi Ext: no edema Skin: warm, dry Left foot in post op shoe    Assessment and Plan   #  hypertension S: medication: Telmisartan 40Mg , amlodipine 5Mg  Home readings #s: 128/70- entered below BP Readings from Last 3 Encounters:  06/25/20 128/70  04/13/20 130/70  02/20/20 124/74  A/P: Stable. Continue current medications.   #hyperlipidemia S: Medication:  Rosuvastatin 20Mg  once a week (changed from atorvastatin 20 mg last year)  Lab Results  Component Value Date   CHOL 134 02/20/2020   HDL 41 02/20/2020   LDLCALC 68 02/20/2020   LDLDIRECT 90.0 09/14/2017   TRIG 186 (H) 02/20/2020   CHOLHDL 3.3 02/20/2020   A/P: Stable. Continue current medications.  LDL ideal under 70 with rosuvastatin- continue current meds  # Diabetes S:  Medication:Metformin 1000MG  twice a day, Januvia 100Mg , glimipride 8Mg  CBGs- 142 this AM Exercise and diet- exercise has been down due to foot surgery, working on eating reasonably healthy Lab Results  Component Value Date   HGBA1C POC 7.0 (A) 06/25/2020   HGBA1C 7.7 (H) 02/20/2020   HGBA1C 6.7 (H) 08/19/2019   A/P: overall very encouraging a1c considering recent surgery. POC 7- likely 7.5 with phlebotomy- we will continue current meds except trade out glimepiride 8mg  for glipizide 10mg  twice daily per insurance request   Recommended follow up: Return in about 4 months (around 10/23/2020) for follow up- or sooner if needed. Future Appointments  Date Time Provider [place]  06/29/2020  4:00 PM Cameron Sprang, MD [facility] None  07/21/2020  3:15 PM Evelina Bucy, DPM [facility]  10/27/2020  8:00 AM [name], MD [facility]  12/22/2020  8:30 AM Hazle Coca, PhD [facility] None  12/22/2020  9:30 AM [facility] None  12/29/2020  2:30 PM Hazle Coca, PhD [facility] None  04/22/2021 10:15 AM [facility] HEALTH COACH [facility]    Lab/Order associations:   ICD-10-CM   1. Hypertension associated with diabetes  E11.59    I15.2   2. Hyperlipidemia associated with type 2 diabetes mellitus  E11.69    E78.5   3. Controlled type 2 diabetes mellitus without complication, without long-term current use of insulin ([facility])  E11.9 POCT A1C    Meds ordered this encounter  Medications  . glipiZIDE (GLUCOTROL) 10 MG tablet    Sig: Take 1 tablet (10 mg total) by mouth 2 (two) times daily before a meal.    Dispense:  180 tablet    Refill:  3    Return precautions advised.  [name], MD

## 2020-06-29 ENCOUNTER — Other Ambulatory Visit: Payer: Self-pay

## 2020-06-29 ENCOUNTER — Ambulatory Visit: Payer: Medicare Other | Admitting: Neurology

## 2020-06-29 ENCOUNTER — Encounter: Payer: Self-pay | Admitting: Neurology

## 2020-06-29 VITALS — BP 132/72 | HR 78 | Ht 72.0 in | Wt 203.4 lb

## 2020-06-29 DIAGNOSIS — G3184 Mild cognitive impairment, so stated: Secondary | ICD-10-CM | POA: Diagnosis not present

## 2020-06-29 MED ORDER — DONEPEZIL HCL 5 MG PO TABS: 5.0000 mg | ORAL_TABLET | Freq: Every day | ORAL | 3 refills | Status: DC

## 2020-06-29 NOTE — Patient Instructions (Signed)
Good to see you!  1. Continue Donepezil 5mg  daily  2. Proceed with Neurocognitive testing as scheduled  3. Follow-up after testing, call for any changes   RECOMMENDATIONS FOR ALL PATIENTS WITH MEMORY PROBLEMS: 1. Continue to exercise (Recommend 30 minutes of walking everyday, or 3 hours every week) 2. Increase social interactions - continue going to [place] and enjoy social gatherings with friends and family 3. Eat healthy, avoid fried foods and eat more fruits and vegetables 4. Maintain adequate blood pressure, blood sugar, and blood cholesterol level. Reducing the risk of stroke and cardiovascular disease also helps promoting better memory. 5. Avoid stressful situations. Live a simple life and avoid aggravations. Organize your time and prepare for the next day in anticipation. 6. Sleep well, avoid any interruptions of sleep and avoid any distractions in the bedroom that may interfere with adequate sleep quality 7. Avoid sugar, avoid sweets as there is a strong link between excessive sugar intake, diabetes, and cognitive impairment The Mediterranean diet has been shown to help patients reduce the risk of progressive memory disorders and reduces cardiovascular risk. This includes eating fish, eat fruits and green leafy vegetables, nuts like almonds and hazelnuts, walnuts, and also use olive oil. Avoid fast foods and fried foods as much as possible. Avoid sweets and sugar as sugar use has been linked to worsening of memory function.

## 2020-06-29 NOTE — Progress Notes (Signed)
NEUROLOGY FOLLOW UP OFFICE NOTE  Walter [name] [id number] Feb 14, 1941  HISTORY OF PRESENT ILLNESS: I had the pleasure of seeing Walter [name] in follow-up in the neurology clinic on 06/29/2020.  The patient was last seen 7 months ago for memory loss. He is alone in the office today. Records and images were personally reviewed where available.  I personally reviewed MRI brain with and without contrast which did not show any acute changes. There was mild diffuse volume loss and mild chronic microvascular disease. He underwent Neuropsychological testing in 12/2019 which showed impairment across confrontation naming, phonemic fluency and receptive language, diagnosis of Mild Neurocognitive Disorder, etiology unclear, cannot rule out Alzheimer's disease, however performances across other measures remained strong. He is on Donepezil 5mg  daily without side effects. He reports memory is "working," some days he is really sharp. He feels he is doing better. He has noticed memory is worse when he gets less sleep or it depends on what he is eating. His wife is also pleased and feels he is better. He denies getting lost driving but has not been driving since his foot surgery in December. He denies missing bills or medications. He gets a minimum of 8 hours of sleep with his CPAP machine. He denies any headaches, dizziness, focal numbness/tingling, no falls. Mood is good.    History on Initial Assessment 11/25/2019: This is a 80 year old right-handed man with a history of hypertension, hyperlipidemia, diabetes, sleep apnea on CPAP, colon cancer s/p resection, presenting for evaluation of memory loss/word-finding difficulties. He reported symptoms started 2 years ago, MMSE 25/30 in PCP office last 08/2019. He and his wife started noticing more significant changes in the past 1 and 1/2 years. He endorses word-finding difficulties, he tries to tell something and the important part slips by him. He states it is not constant, however  his wife says it happens a lot. He is searching for words and thoughts, unable to remember names of things or restaurants. He is forgetting how to do things sometimes, this is not often, but one time he forgot how to put his car in park (had to push a button). He has had this car for 2 years. He repeats stories. He cannot focus when watching TV and she is speaking to him, unable to multitask. He says "huh" or "what did you say" a lot, and she does not think it is his hearing. He lives with his wife. He denies getting lost driving, no missed ills or medications. He denies misplacing things. His wife has also noticed personality changes. He has a short temper worse than before and gets very nasty. When he cannot think of a word, it makes him nervous and uptight. He gets angry and upset when criticized or when he does not agree. No paranoia or hallucinations, no prior psychiatric history. He states his mood is good. His mother had dementia. No history of significant head injuries. No alcohol use.  He had constipation which resolved with a stool softener. He denies any headaches, dizziness, diplopia, dysarthria/dysphagia, neck/back pain, focal numbness/tingling/weakness, bladder dysfunction, anosmia, or tremors. He sleeps like a baby with his CPAP machine. 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     Laboratory Data: Lab Results  Component Value Date   TSH 2.05 08/19/2019   Lab Results  Component Value Date   [id number] 683 08/19/2019     PAST MEDICAL HISTORY: Past Medical History:  Diagnosis Date  . Allergic rhinitis 08/19/2019   flonase  .  BPH (benign prostatic hyperplasia) 07/02/2015   S/p TURP with multiple complications afterwards including recurrent hospitalizations. All started after a hemorrhoid surgery and later with urinary retention. Patient at one point was septic from urinary issues.    . Complication of anesthesia    bladder problems after surgery from Anesthesia- can not  urinate  . Diabetes mellitus type II, controlled 04/23/2007    Metformin 1g BID, amaryl 4mg --> 8mg , had to add [place] back  Never took victoza.   Januvia-lethargic and dizzy in past. Retrial ok; could change to victoza if needed Serious UTI history- likely avoid sglt2 inhibtor  . History of colon cancer    Tubular adenoma of colon; s/p colectomy 1992   . 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 History of shingles   . Hyperlipidemia associated with type 2 diabetes mellitus 04/23/2007   Atorvastatin 20mg  once weekly    . Hypertension associated with diabetes 04/23/2007   Amlodipine 5mg , telmisartan 40mg     . Mild neurocognitive disorder 12/23/2019  . 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 Obstructive sleep apnea 04/23/2007   CPAP   . 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 Sepsis secondary to UTI 03/29/2015    MEDICATIONS: Current Outpatient Medications on File Prior to Visit  Medication Sig Dispense Refill  . amLODipine (NORVASC) 5 MG tablet TAKE 1 TABLET BY MOUTH EVERY DAY 90 tablet 3  . donepezil (ARICEPT) 5 MG tablet Take 1/2 tablet daily for 2 weeks, then increase to 1 tablet daily (Patient taking differently: Take 5 mg by mouth at bedtime.) 30 tablet 11  . fluticasone (FLONASE) 50 MCG/ACT nasal spray SPRAY 2 SPRAYS INTO EACH NOSTRIL EVERY DAY 48 mL 1  . glipiZIDE (GLUCOTROL) 10 MG tablet Take 1 tablet (10 mg total) by mouth 2 (two) times daily before a meal. 180 tablet 3  . JANUVIA 100 MG tablet TAKE 1 TABLET BY MOUTH EVERY DAY 90 tablet 1  . metFORMIN (GLUCOPHAGE) 1000 MG tablet TAKE 1 TABLET BY MOUTH 2 TIMES DAILY WITH A MEAL. 180 tablet 3  . Multiple Vitamins-Minerals (CENTRUM SILVER PO) Take 1 tablet by mouth daily.     Glory Rosebush ULTRA test strip USE ONCE DAILY AS INSTRUCTED 100 strip 3  . rosuvastatin (CRESTOR) 20 MG tablet Take by mouth.    . telmisartan (MICARDIS) 40 MG tablet TAKE 1 TABLET BY MOUTH EVERY DAY 90 tablet 3   No current facility-administered medications on  file prior to visit.    ALLERGIES: Allergies  Allergen Reactions  . Bactrim [Sulfamethoxazole-Trimethoprim] Nausea And Vomiting    Pt has chills and fevers also.    FAMILY HISTORY: Family History  Problem Relation Age of Onset  . Colon cancer Father 36  . Diabetes Father        type I  . Alzheimer's disease Mother   . Cancer Other        colon/fhx  . Rectal cancer Maternal Uncle   . Colon cancer Paternal Grandmother 63  . Esophageal cancer Neg Hx   . Stomach cancer Neg Hx     SOCIAL HISTORY: Social History   Socioeconomic History  . Marital status: Married    Spouse name: Not on file  . Number of children: Not on file  . Years of education: 42  . Highest education level: Master's degree (e.g., MA, MS, MEng, MEd, MSW, MBA)  Occupational History  . Occupation: Retired  Tobacco Use  . Smoking status: Former Smoker    Packs/day: 0.25  Years: 10.00    Pack years: 2.50    Types: Pipe  . Smokeless tobacco: Never Used  Vaping Use  . Vaping Use: Never used  Substance and Sexual Activity  . Alcohol use: Yes    Alcohol/week: 2.0 standard drinks    Types: 2 Cans of beer per week    Comment: social  . Drug use: No  . Sexual activity: Not on file  Other Topics Concern  . Not on file  Social History Narrative   Married 1966. 2 daughters Sharee Pimple divorced lives in [place] works for [employer] no kids and Mateo Flow never married in [place], [place] working for [employer] for [employer] improvement no kids.       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)      No religious beliefs affecting health care, no afterlife beliefs      Right Handed      Two Story Home   Social Determinants of Health   Financial Resource Strain: Low Risk   . Difficulty of Paying Living Expenses: Not hard at all  Food Insecurity: No Food Insecurity  . Worried About Charity fundraiser in the Last Year: Never true  . Ran Out of  Food in the Last Year: Never true  Transportation Needs: No Transportation Needs  . Lack of Transportation (Medical): No  . Lack of Transportation (Non-Medical): No  Physical Activity: Inactive  . Days of Exercise per Week: 0 days  . Minutes of Exercise per Session: 0 min  Stress: No Stress Concern Present  . Feeling of Stress : Not at all  Social Connections: Moderately Integrated  . Frequency of Communication with Friends and Family: Three times a week  . Frequency of Social Gatherings with Friends and Family: Once a week  . Attends Religious Services: Never  . Active Member of Clubs or Organizations: Yes  . Attends Archivist Meetings: 1 to 4 times per year  . Marital Status: Married  Human resources officer Violence: Not At Risk  . Fear of Current or Ex-Partner: No  . Emotionally Abused: No  . Physically Abused: No  . Sexually Abused: No     PHYSICAL EXAM: Vitals:   06/29/20 1535  BP: 132/72  Pulse: 78  SpO2: 94%   General: No acute distress Head:  Normocephalic/atraumatic Skin/Extremities: No rash, no edema Neurological Exam: alert and oriented to person, place, and time. No aphasia or dysarthria. Fund of knowledge is appropriate.  Recent and remote memory are intact, 3/3 delayed recall.  Attention and concentration are normal, 5/5 serial 7s. Cranial nerves: Pupils equal, round. Extraocular movements intact with no nystagmus. Visual fields full.  No facial asymmetry.  Motor: Bulk and tone normal, muscle strength 5/5 throughout with no pronator drift.   Finger to nose testing intact.  Gait narrow-based and steady, slightly favoring right leg due to recent right foot surgery. No ataxia   IMPRESSION: This is a pleasant 80 yo RH man with a history of hypertension, hyperlipidemia, diabetes, sleep apnea on CPAP, colon cancer s/p resection, with Mild Cognitive Impairment. MRI brain showed mild diffuse atrophy and chronic microvascular disease. Neuropsychological evaluation in  12/2019 indicated Mild Neurocognitive Disorder, etiology unclear, cannot rule out AD, however performances across other measures remained strong. He reports doing well, continue Donepezil 5mg  daily. We discussed repeating Neuropsychological evaluation in 11/2020 to assess trajectory. Continue control of vascular risk factors, physical exercise, and brain stimulation exercises for  brain health. Follow-up after testing, he knows to call for any changes.     Thank you for allowing me to participate in his care.  Please do not hesitate to call for any questions or concerns.   Ellouise Newer, M.D.   CC: Dr. Yong Channel

## 2020-06-30 NOTE — Progress Notes (Signed)
  Subjective:  Patient ID: Kanyon Seibold, male    DOB: 1940-08-11,  MRN: [id number]  Chief Complaint  Patient presents with  . Routine Post Op    POV#6 Pt denies fever/nausea/vomiting/chills.   DOS: 04/29/20 Procedure: Right foot 1st MPJ fusion, correction hammertoes 3rd/4th toes   80 y.o. male presents with the above complaint. History confirmed with patient. Doing well post-op denies issues or concerns.  Objective:  Physical Exam: no tenderness at the surgical site, local edema noted and calf supple, nontender. Toes rectus. Incision: well healed incision  Assessment:   1. Pain in right foot   2. Hallux valgus (acquired), right foot   3. Hammer toe of right foot   4. Post-operative state     Plan:  Patient was evaluated and treated and all questions answered.  Post-operative State -New XR show good healing and positioning. -Ok to start WB at this time in surgical shoe. Transition to normal shoegear in 2 weeks. -F/u in 1 month for recheck.  No follow-ups on file.

## 2020-07-11 ENCOUNTER — Other Ambulatory Visit: Payer: Self-pay | Admitting: Family Medicine

## 2020-07-11 ENCOUNTER — Other Ambulatory Visit: Payer: Self-pay | Admitting: Podiatry

## 2020-07-21 ENCOUNTER — Ambulatory Visit (INDEPENDENT_AMBULATORY_CARE_PROVIDER_SITE_OTHER): Payer: Medicare Other | Admitting: Podiatry

## 2020-07-21 ENCOUNTER — Other Ambulatory Visit: Payer: Self-pay

## 2020-07-21 ENCOUNTER — Ambulatory Visit (INDEPENDENT_AMBULATORY_CARE_PROVIDER_SITE_OTHER): Payer: Medicare Other

## 2020-07-21 DIAGNOSIS — M2041 Other hammer toe(s) (acquired), right foot: Secondary | ICD-10-CM

## 2020-07-21 DIAGNOSIS — M2011 Hallux valgus (acquired), right foot: Secondary | ICD-10-CM

## 2020-07-21 DIAGNOSIS — Z9889 Other specified postprocedural states: Secondary | ICD-10-CM

## 2020-07-21 DIAGNOSIS — M79671 Pain in right foot: Secondary | ICD-10-CM | POA: Diagnosis not present

## 2020-07-21 NOTE — Progress Notes (Signed)
  Subjective:  Patient ID: Walter [name], male    DOB: 1941/03/27,  MRN: [id number]  Chief Complaint  Patient presents with  . Routine Post Op    POV #7 DOS 04/29/2020 RT 1ST MPJ FUSION, CORRECTION OF HAMMERTOES 3,4 & 5 RT FOOT. Pt complains of edema. Pt. Needs recommendations on where to buy shoes.    DOS: 04/29/20 Procedure: Right foot 1st MPJ fusion, correction hammertoes 3rd/4th toes   80 y.o. male presents with the above complaint. History confirmed with patient.  Objective:  Physical Exam: no tenderness at the surgical site, local edema noted and calf supple, nontender. Toes rectus. Incision: well healed incision  Assessment:   1. Hallux valgus (acquired), right foot   2. Post-operative state   3. Hammer toe of right foot    Plan:  Patient was evaluated and treated and all questions answered.  Post-operative State -Final XR show full healing. -He is wearing normal shoes comfortably without issues. He only complains of minor swelling to the big toe. -He is doing very well. He is able to do more ADLs now, wear normal shoegear without issues. He is very pleased with his surgery. At this point we can discharge him with f/u as needed  Return if symptoms worsen or fail to improve.

## 2020-08-16 ENCOUNTER — Other Ambulatory Visit: Payer: Self-pay | Admitting: Podiatry

## 2020-08-16 NOTE — Telephone Encounter (Signed)
Please advise 

## 2020-08-20 ENCOUNTER — Ambulatory Visit: Payer: Medicare Other | Admitting: Family Medicine

## 2020-10-27 ENCOUNTER — Encounter: Payer: Self-pay | Admitting: Family Medicine

## 2020-10-27 ENCOUNTER — Other Ambulatory Visit: Payer: Self-pay

## 2020-10-27 ENCOUNTER — Ambulatory Visit: Payer: Medicare Other | Admitting: Family Medicine

## 2020-10-27 VITALS — BP 122/72 | HR 74 | Temp 97.7°F | Ht 72.0 in | Wt 199.4 lb

## 2020-10-27 DIAGNOSIS — I152 Hypertension secondary to endocrine disorders: Secondary | ICD-10-CM

## 2020-10-27 DIAGNOSIS — E785 Hyperlipidemia, unspecified: Secondary | ICD-10-CM

## 2020-10-27 DIAGNOSIS — E119 Type 2 diabetes mellitus without complications: Secondary | ICD-10-CM

## 2020-10-27 DIAGNOSIS — Z89421 Acquired absence of other right toe(s): Secondary | ICD-10-CM

## 2020-10-27 DIAGNOSIS — E1169 Type 2 diabetes mellitus with other specified complication: Secondary | ICD-10-CM

## 2020-10-27 DIAGNOSIS — E1159 Type 2 diabetes mellitus with other circulatory complications: Secondary | ICD-10-CM | POA: Diagnosis not present

## 2020-10-27 LAB — COMPREHENSIVE METABOLIC PANEL
ALT: 22 U/L (ref 0–53)
AST: 15 U/L (ref 0–37)
Albumin: 4.3 g/dL (ref 3.5–5.2)
Alkaline Phosphatase: 72 U/L (ref 39–117)
BUN: 13 mg/dL (ref 6–23)
CO2: 26 mEq/L (ref 19–32)
Calcium: 9.5 mg/dL (ref 8.4–10.5)
Chloride: 102 mEq/L (ref 96–112)
Creatinine, Ser: 0.87 mg/dL (ref 0.40–1.50)
GFR: 81.82 mL/min (ref >60.00)
Glucose, Bld: 160 mg/dL — ABNORMAL HIGH (ref 70–99)
Potassium: 4.2 mEq/L (ref 3.5–5.1)
Sodium: 138 mEq/L (ref 135–145)
Total Bilirubin: 0.5 mg/dL (ref 0.2–1.2)
Total Protein: 7 g/dL (ref 6.0–8.3)

## 2020-10-27 LAB — HEMOGLOBIN A1C: Hgb A1c MFr Bld: 8.1 % — ABNORMAL HIGH (ref 4.6–6.5)

## 2020-10-27 NOTE — Progress Notes (Signed)
Phone [phone] In person visit   Subjective:   Walter [name] is a 80 y.o. year old very pleasant male patient who presents for/with See problem oriented charting Chief Complaint  Patient presents with   Hypertension   Diabetes    This visit occurred during the SARS-CoV-2 public health emergency.  Safety protocols were in place, including screening questions prior to the visit, additional usage of staff PPE, and extensive cleaning of exam room while observing appropriate contact time as indicated for disinfecting solutions.   Past Medical History-  Patient Active Problem List   Diagnosis Date Noted   Mild neurocognitive disorder 12/23/2019    Priority: High   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    Priority: High   BPH (benign prostatic hyperplasia) 07/02/2015    Priority: High   Diabetes mellitus type II, controlled 04/23/2007    Priority: High   History of colon cancer 01/27/2014    Priority: Medium   Hyperlipidemia associated with type 2 diabetes mellitus 04/23/2007    Priority: Medium   Obstructive sleep apnea 04/23/2007    Priority: Medium   Hypertension associated with diabetes 04/23/2007    Priority: Medium   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    Priority: Low   Hallux valgus of right foot 03/26/2014    Priority: Low   Iritis 12/17/2008    Priority: Low   Postherpetic neuralgia 05/22/2008    Priority: Low   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 90 tablet 3   fluticasone (FLONASE) 50 MCG/ACT nasal spray SPRAY 2 SPRAYS INTO EACH NOSTRIL EVERY DAY  48 mL 1   glipiZIDE (GLUCOTROL) 10 MG tablet Take 1 tablet (10 mg total) by mouth 2 (two) times daily before a meal. 180 tablet 3   JANUVIA 100 MG tablet TAKE 1 TABLET BY MOUTH EVERY DAY 90 tablet 1   metFORMIN (GLUCOPHAGE) 1000 MG tablet TAKE 1 TABLET BY MOUTH 2 TIMES DAILY WITH A MEAL. 180 tablet 3   Multiple Vitamins-Minerals (CENTRUM SILVER PO) Take 1 tablet by mouth daily.      ONETOUCH ULTRA test strip USE ONCE DAILY AS INSTRUCTED 100 strip 3   rosuvastatin (CRESTOR) 20 MG tablet TAKE 1 TABLET BY MOUTH ONCE A WEEK 13 tablet 3   telmisartan (MICARDIS) 40 MG tablet TAKE 1 TABLET BY MOUTH EVERY DAY 90 tablet 3   No current facility-administered medications for this visit.     Objective:  BP 122/72   Pulse 74   Temp 97.7 F (36.5 C) (Temporal)   Ht 6' (1.829 m)   Wt 199 lb 6.4 oz (90.4 kg)   SpO2 96%   BMI 27.04 kg/m  Gen: NAD, resting comfortably CV: RRR no murmurs rubs or gallops Lungs: CTAB no crackles, wheeze, rhonchi Ext: minimal edema Skin: warm, dry     Assessment and Plan   #hypertension S: medication: Telmisartan 40 mg, amlodipine 5 mg Home readings #s: reports well controlled BP Readings from Last 3 Encounters:  10/27/20 122/72  06/29/20 132/72  06/25/20 128/70  A/P: Stable. Continue current medications.  #  hyperlipidemia S: Medication:Rosuvastatin 20 mg once a week Lab Results  Component Value Date   CHOL 134 02/20/2020   HDL 41 02/20/2020   LDLCALC 68 02/20/2020   LDLDIRECT 90.0 09/14/2017   TRIG 186 (H) 02/20/2020   CHOLHDL 3.3 02/20/2020   A/P: excellent control last check- update lipid panel at next visit  # Diabetes S: Medication: metformin 1000 mg, Januvia 100 mg, glipizide 10 mg twice daily CBGs- 160s to 175 in AM-  Exercise and diet- exercise has been down due to foot issues- plans to start walking again next month. Weight- has improved his diet intakecongratulated patient on losing 4 pounds from last visit. Lab Results  Component Value  Date   HGBA1C 7.0 (A) 06/25/2020   HGBA1C 7.7 (H) 02/20/2020   HGBA1C 6.7 (H) 08/19/2019   A/P: a1c could be higher based on fasting CBGs- update today -if elevated could consider switching januvia to ozempic or starting jardiance - loss of 2nd toe history- no further issues- close follow up with podiatry to prevent further issues  # Mild neurocognitive disorder- followed by Dr. Delice Lesch- still on Aricept. Stable recently.   #OSA- CPAP machine regularly- working on getting supplies  Recommended follow up: Return in about 4 months (around 02/26/2021) for physical. Future Appointments  Date Time Provider [place]  12/22/2020  8:30 AM Hazle Coca, PhD [facility] None  12/22/2020  9:30 AM [facility]- NEUROPSYCH TECH [facility] None  12/28/2020  2:30 PM Hazle Coca, PhD [facility] None  03/05/2021  8:30 AM Cameron Sprang, MD [facility] None  04/22/2021 10:15 AM [facility] HEALTH COACH [facility]    Lab/Order associations:   ICD-10-CM   1. Hypertension associated with diabetes  E11.59    I15.2     2. Hyperlipidemia associated with type 2 diabetes mellitus  E11.69    E78.5     3. Controlled type 2 diabetes mellitus without complication, without long-term current use of insulin (HCC)  E11.9 Comprehensive metabolic panel    Hemoglobin A1c    4. 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  I, [name], MD, have reviewed all documentation for this visit. The documentation on 10/27/20 for the exam, diagnosis, procedures, and orders are all accurate and complete.   Return precautions advised.  [name], MD

## 2020-10-27 NOTE — Patient Instructions (Addendum)
  Please stop by lab before you go If you have mychart- we will send your results within 3 business days of [place] receiving them.  If you do not have mychart- we will call you about results within 5 business days of [place] receiving them.  *please also note that you will see labs on mychart as soon as they post. I will later go in and write notes on them- will say "notes from Dr. Yong Channel"  No changes today unless labs lead [place] to make changes  Please check with your pharmacy to see if they have the shingrix vaccine. If they do- please get this immunization and update [place] by phone call or mychart with dates you receive the vaccine

## 2020-11-13 ENCOUNTER — Other Ambulatory Visit: Payer: Self-pay | Admitting: Family Medicine

## 2020-11-13 ENCOUNTER — Other Ambulatory Visit: Payer: Self-pay | Admitting: Podiatry

## 2020-11-20 ENCOUNTER — Other Ambulatory Visit: Payer: Self-pay

## 2020-11-20 ENCOUNTER — Ambulatory Visit: Payer: Medicare Other | Admitting: Podiatry

## 2020-11-20 ENCOUNTER — Ambulatory Visit (INDEPENDENT_AMBULATORY_CARE_PROVIDER_SITE_OTHER): Payer: Medicare Other

## 2020-11-20 DIAGNOSIS — M79671 Pain in right foot: Secondary | ICD-10-CM

## 2020-11-20 DIAGNOSIS — M2011 Hallux valgus (acquired), right foot: Secondary | ICD-10-CM | POA: Diagnosis not present

## 2020-11-20 DIAGNOSIS — M7741 Metatarsalgia, right foot: Secondary | ICD-10-CM

## 2020-11-20 DIAGNOSIS — Z9889 Other specified postprocedural states: Secondary | ICD-10-CM

## 2020-11-20 DIAGNOSIS — M21961 Unspecified acquired deformity of right lower leg: Secondary | ICD-10-CM | POA: Diagnosis not present

## 2020-11-27 NOTE — Progress Notes (Signed)
  Subjective:  Patient ID: Walter [name], male    DOB: [birth year],  MRN: [id number]  Chief Complaint  Patient presents with   Pain    Pain at Rt ball of foot and swelling -ongoing thin b4 sax - w/ swelling -3/10 discomfort Tx: none   DOS: 04/29/20 Procedure: Right foot 1st MPJ fusion, correction hammertoes 3rd/4th toes   80 y.o. male presents with the above complaint. History confirmed with patient.  Objective:  Physical Exam: Maintain fusion of the first metatarsophalangeal joint with slight hallux interphalangeus deformity.  No pain palpation about the first toe or first metatarsal.  Pain palpation about the third metatarsophalangeal joint Assessment:   1. Hallux valgus (acquired), right foot   2. Post-operative state   3. Pain in right foot    Plan:  Patient was evaluated and treated and all questions answered.  Capsulitis metatarsophalangeal joint, metatarsalgia -X-rays reviewed with patient.  Well-healed arthrodesis, hallux arthroplasty with slight interphalangeus deformity. 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  We discussed that we could consider either metatarsal realignment with Weil osteotomy or metatarsal head excision should his pain persist.  He does not have significant pain right now and enjoys walking.  I encouraged him to continue walking and building up his stamina and should pain become a daily occurrence we can discuss further surgical intervention  No follow-ups on file.

## 2020-12-18 LAB — HM DIABETES EYE EXAM

## 2020-12-22 ENCOUNTER — Ambulatory Visit (INDEPENDENT_AMBULATORY_CARE_PROVIDER_SITE_OTHER): Payer: Medicare Other | Admitting: Psychology

## 2020-12-22 ENCOUNTER — Other Ambulatory Visit: Payer: Self-pay

## 2020-12-22 ENCOUNTER — Ambulatory Visit: Payer: Medicare Other

## 2020-12-22 ENCOUNTER — Encounter: Payer: Self-pay | Admitting: Psychology

## 2020-12-22 DIAGNOSIS — G3184 Mild cognitive impairment, so stated: Secondary | ICD-10-CM | POA: Diagnosis not present

## 2020-12-22 DIAGNOSIS — R4189 Other symptoms and signs involving cognitive functions and awareness: Secondary | ICD-10-CM

## 2020-12-22 NOTE — Progress Notes (Signed)
   Psychometrician Note   Cognitive testing was administered to Walter [name] by Cruzita Lederer, B.S. (psychometrist) under the supervision of Dr. Christia Reading, Ph.D., licensed psychologist on 12/22/2020. Walter [name] did not appear overtly distressed by the testing session per behavioral observation or responses across self-report questionnaires. Rest breaks were offered.    The battery of tests administered was selected by Dr. Christia Reading, Ph.D. with consideration to Walter [name]'s current level of functioning, the nature of his symptoms, emotional and behavioral responses during interview, level of literacy, observed level of motivation/effort, and the nature of the referral question. This battery was communicated to the psychometrist. Communication between Dr. Christia Reading, Ph.D. and the psychometrist was ongoing throughout the evaluation and Dr. Christia Reading, Ph.D. was immediately accessible at all times. Dr. Christia Reading, Ph.D. provided supervision to the psychometrist on the date of this service to the extent necessary to assure the quality of all services provided.    Walter [name] will return within approximately 1-2 weeks for an interactive feedback session with Dr. Melvyn Novas at which time his test performances, clinical impressions, and treatment recommendations will be reviewed in detail. Walter [name] understands he can contact our office should he require our assistance before this time.  A total of 140 minutes of billable time were spent face-to-face with Walter [name] by the psychometrist. This includes both test administration and scoring time. Billing for these services is reflected in the clinical report generated by Dr. Christia Reading, Ph.D.  This note reflects time spent with the psychometrician and does not include test scores or any clinical interpretations made by Dr. Melvyn Novas. The full report will follow in a separate note.

## 2020-12-22 NOTE — Progress Notes (Signed)
NEUROPSYCHOLOGICAL EVALUATION [address] [place] [facility] Neurology  Date of Evaluation: December 22, 2020  Reason for Referral:   Walter [name] is a 80 y.o. right-handed Caucasian male referred by Ellouise Newer, M.D., to characterize his current cognitive functioning and assist with diagnostic clarity and treatment planning in the context of a prior diagnosis of mild neurocognitive disorder and concerns for ongoing cognitive decline.   Assessment and Plan:   Clinical Impression(s): Walter [name] pattern of performance is suggestive of noted performance variability surrounding executive functioning, semantic fluency, confrontation naming, and encoding (i.e., encoding) and retrieval aspects of memory. Performance was generally appropriate relative to age-matched peers across processing speed, receptive language, phonemic fluency, and visuospatial abilities. Walter [name] denied difficulties completing instrumental activities of daily living (ADLs) independently. His wife who was present did not contradict this. 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  Relative to his evaluation one year prior, his most notable areas of decline surrounded executive functioning and attention/concentration. These were generally mild overall. There was also perhaps a more subtle decline across visual memory, semantic fluency, and confrontation naming; however, these latter declines are harder to detect. Verbal memory continues to represent an ongoing weakness. However, despite different instrumentation, I do not believe that significant decline has been captured since his previous evaluation across testing. Subtle improvements were seen across phonemic fluency and receptive language.   Regarding etiology, there remain some concerns surrounding Alzheimer's disease  based upon memory dysfunction, coupled with deficits in confrontation naming, semantic fluency, and executive functioning. As such, I cannot rule out this diagnosis. However, despite scores suggesting delayed memory falling in the exceptionally low normative range, he was able to demonstrate some appropriate retrieval/consolidation abilities, which would not suggest characteristic rapid forgetting. If Alzheimer's disease is indeed the underlying pathology, it appears to be progressing slowly, likely aided by current medication intervention and regular physical/mental stimulation. There could also be a vascular contribution given his medical history (e.g., hypertension, hyperlipidemia, type II diabetes, obstructive sleep apnea) and neuroimaging suggesting some small vessel ischemic changes. Test scores and behavioral characteristics are still not concerning for Lewy body dementia or frontotemporal dementia. Continued medical monitoring will be important moving forward.   Recommendations: A repeat neuropsychological evaluation in 24 months (or sooner if functional decline is noted) is recommended to assess the trajectory of future cognitive decline should it occur. This will also aid in future efforts towards improved diagnostic clarity.  He and his wife reported good benefit since taking donepezil. He reported currently taking '5mg'$ . If desired, he could discuss with Dr. Delice Lesch about increasing this dose.   Should there be further progression of current deficits, he is unlikely to regain any independent living skills lost. Therefore, it is recommended that he remain as involved as possible in all aspects of household chores, finances, and medication management, with supervision to ensure adequate performance. He will likely benefit from the establishment and maintenance of a routine in order to maximize his functional abilities over time.  It will be important for Walter [name] to have another person with him when  in situations where he may need to process information, weigh the pros and cons of different options, and make decisions, in order to ensure that he fully understands and recalls all information to be considered.  Walter [name] is encouraged to attend to lifestyle factors for brain health (e.g., regular  physical exercise, good nutrition habits, regular participation in cognitively-stimulating activities, and general stress management techniques), which are likely to have benefits for both emotional adjustment and cognition. Optimal control of vascular risk factors (including safe cardiovascular exercise and adherence to dietary recommendations) is encouraged. Likewise, continued compliance with his CPAP machine will also be important.  Memory can be improved using internal strategies such as rehearsal, repetition, chunking, mnemonics, association, and imagery. External strategies such as written notes in a consistently used memory journal, visual and nonverbal auditory cues such as a calendar on the refrigerator or appointments with alarm, such as on a cell phone, can also help maximize recall.    When learning new information, he would benefit from information being broken up into small, manageable pieces. He may also find it helpful to articulate the material in his own words and in a context to promote encoding at the onset of a new task. This material may need to be repeated multiple times to promote encoding.  Because he shows better recall for structured information, he will likely understand and retain new information better if it is presented to him in a meaningful or well-organized manner at the outset, such as grouping items into meaningful categories or presenting information in an outlined, bulleted, or story format.   To address problems with fluctuating attention and executive dysfunction, he may wish to consider:   -Avoiding external distractions when needing to concentrate   -Limiting  exposure to fast paced environments with multiple sensory demands   -Writing down complicated information and using checklists   -Attempting and completing one task at a time (i.e., no multi-tasking)   -Verbalizing aloud each step of a task to maintain focus   -Taking frequent breaks during the completion of steps/tasks to avoid fatigue   -Reducing the amount of information considered at one time  Review of Records:   Walter [name] was seen by [facility] Marland KitchenEllouise Newer, M.D.) on 11/25/2019 for an evaluation of memory loss. Briefly, Walter [name] reported symptoms starting approximately two years ago. However, he and his wife started noticing more significant changes in the past 1.5 years. He primarily reported word-finding difficulties where he tries to tell someone something and the important part slips by him. He described this as happening occasionally; however, his wife says it happens a lot. His wife stated that he will often search for words and thoughts and is sometimes unable to remember names of things or restaurants. He also seems to forget how to do familiar things (e.g., one time he forgot how to put his car in park). He was also noted to repeat stories, has trouble multi-tasking, and cannot focus when watching TV. He denied getting lost driving and ADLs were described as intact. His wife reported some personality changes in that when he cannot think of a word, he becomes nervous and uptight. He denied any headaches, dizziness, diplopia, dysarthria/dysphagia, neck/back pain, focal numbness/tingling/weakness, bladder dysfunction, anosmia, tremors, hallucinations, or psychiatric distress. He has a history of sleep apnea and uses his CPAP machine nightly. Performance on a brief cognitive screening instrument (MMSE) was 25/30 with his PCP in April 2021. Performance on the SLUMS was 19/30 with Dr. Delice Lesch.  He completed a comprehensive neuropsychological evaluation with myself on 12/23/2019. Results at  that time suggested an impairment across confrontation naming, as well as additional weaknesses across phonemic fluency and receptive language. Performance variability was further exhibited across executive functioning and learning and memory. Performance was appropriate across domains of processing speed,  attention/concentration, semantic fluency, and visuospatial abilities. Mr. Ensor denied difficulties completing instrumental activities of daily living (ADLs) independently and was diagnosed with a mild neurocognitive disorder ("mild cognitive impairment"). The etiology of weaknesses was said to be unclear. Alzheimer's disease could not be ruled out given verbal memory dysfunction and impairments in confrontation naming. However, other performances remained strong. Repeat testing in 12-18 months was recommended.   He followed up with Dr. Delice Lesch on 06/29/2020. He has been taking donepezil '5mg'$  daily without side effects. He reported that his memory is "working" and that some days he feels very sharp. Overall, he reported feeling that he has been doing a lot better. Memory dysfunction was primarily observed when he gets less sleep. His wife also reported feeling pleased and that he has been doing much better. ADLs were described as intact. He has not been driving since his foot surgery this past December but denied difficulties prior to this. He utilizes his CPAP machine nightly and denied headaches, dizziness, focal numbness/tingling, or recent falls. No mood-related concerns were reported. Ultimately, Mr. Lemberg was referred for a comprehensive neuropsychological evaluation to characterize his cognitive abilities and to assist with diagnostic clarity and treatment planning.   Brain MRI on 12/21/2019 revealed mild for age white matter disease and generalized volume loss.   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 All started after a hemorrhoid surgery and later with urinary retention. Patient at one point was septic from urinary issues.     Diabetes mellitus type II, controlled 04/23/2007    Metformin 1g BID, amaryl '4mg'$ --> '8mg'$ , had to add [place] back  Never took victoza.   Januvia-lethargic and dizzy in past. 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   History of shingles    Hyperlipidemia associated with type 2 diabetes mellitus 04/23/2007   Atorvastatin '20mg'$  once weekly     Hypertension associated with diabetes 04/23/2007   Amlodipine '5mg'$ , telmisartan '40mg'$      Iritis 12/17/2008   Shingles 2010    Mild neurocognitive disorder 12/23/2019   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   Obstructive sleep apnea 04/23/2007   uses CPAP nightly   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   Sepsis secondary to UTI 03/29/2015    Past Surgical History:  Procedure Laterality Date   AMPUTATION TOE Right 09/14/2016   Procedure: RIGHT 2ND TOE/RAY AMPUTATION;  Surgeon: Leandrew Koyanagi, MD;  Location: [facility];  Service: Orthopedics;  Laterality: Right;   [place] N/A 06/21/2015   Procedure: CYSTOSCOPY, CLOT EVACUATION, AND CAUTERIZATION OF PROSTATE FOSSA;  Surgeon: Carolan Clines, MD;  Location: [facility];  Service: Urology;  Laterality: N/A;   HEMORRHOID SURGERY     TRANSURETHRAL RESECTION OF PROSTATE N/A 05/23/2015   Procedure: TRANSURETHRAL RESECTION OF THE PROSTATE (TURP);  Surgeon: Irine Seal, MD;   Location: [facility];  Service: Urology;  Laterality: N/A;    Current  Outpatient Medications:    amLODipine (NORVASC) 5 MG tablet, TAKE 1 TABLET BY MOUTH EVERY DAY, Disp: 90 tablet, Rfl: 3   donepezil (ARICEPT) 5 MG tablet, Take 1 tablet (5 mg total) by mouth at bedtime., Disp: 90 tablet, Rfl: 3   fluticasone (FLONASE) 50 MCG/ACT nasal spray, SPRAY 2 SPRAYS INTO EACH NOSTRIL EVERY DAY, Disp: 48 mL, Rfl: 1   glipiZIDE (GLUCOTROL) 10 MG tablet, Take 1 tablet (10 mg total) by mouth 2 (two) times daily before a meal., Disp: 180 tablet, Rfl: 3   JANUVIA 100 MG tablet, TAKE 1 TABLET BY MOUTH EVERY DAY, Disp: 90 tablet, Rfl: 1   metFORMIN (GLUCOPHAGE) 1000 MG tablet, TAKE 1 TABLET BY MOUTH 2 TIMES DAILY WITH A MEAL., Disp: 180 tablet, Rfl: 3   Multiple Vitamins-Minerals (CENTRUM SILVER PO), Take 1 tablet by mouth daily. , Disp: , Rfl:    ONETOUCH ULTRA test strip, USE ONCE DAILY AS INSTRUCTED, Disp: 100 strip, Rfl: 3   rosuvastatin (CRESTOR) 20 MG tablet, TAKE 1 TABLET BY MOUTH ONCE A WEEK, Disp: 13 tablet, Rfl: 3   telmisartan (MICARDIS) 40 MG tablet, TAKE 1 TABLET BY MOUTH EVERY DAY, Disp: 90 tablet, Rfl: 3  Clinical Interview:   The following information was obtained during a clinical interview with Mr. Verville during his previous neuropsychological evaluation in August 2021. Sections were updated based upon the current interview with he and his wife where relevant.  Cognitive Symptoms: Decreased short-term memory: Endorsed. However, memory difficulties were previously attributed to word finding concerns which were said to be present occasionally and not all the time. Walter [name] denied more traditional short-term memory difficulties (e.g., forgetting details of previous conversations or misplacing things around his home). Currently, Walter [name] and his wife reported an improvement in this area since starting donepezil. They denied any observed cognitive decline, with both stating that they felt things had  remained stable if not shown some improvement. Walter [name] added that other family members have also commented to him that he has seemed more sharp lately.  Decreased long-term memory: Denied. Decreased attention/concentration: Denied. Reduced processing speed: Denied. Difficulties with executive functions: Denied. Difficulties with emotion regulation: Largely denied. He previously acknowledged some impulsivity in that he has a tendency to want things that can be completed done right away. This was said to be longstanding in nature. He denied difficulties with organization, decision making, or judgment. Overt personality changes were likewise denied. This was said to be stable over time. His wife did add that Walter [name] may have a shorter fuse at times when he gets frustrated or is presented with an overly challenging problem.  Difficulties with receptive language: Denied. Difficulties with word finding: Endorsed (see above).   Decreased visuoperceptual ability: Denied.   Difficulties completing ADLs: Denied.  Additional Medical History: History of traumatic brain injury/concussion: Denied. History of stroke: Denied. History of seizure activity: Denied. History of known exposure to toxins: Denied. Symptoms of chronic pain: Denied. Since his previous evaluation, he did report having foot surgery with some residual pain symptoms from time to time. He also reported having his back "worked on." However, current pain symptoms were said to be minimal and quite manageable overall.  Experience of frequent headaches/migraines: Denied. Frequent instances of dizziness/vertigo: Denied.   Sensory changes: Denied.  Balance/coordination difficulties: Denied. Other motor difficulties: Denied.  Sleep History: Estimated hours obtained each night: 6-7 hours.  Difficulties falling asleep: Denied when using his CPAP machine.  Difficulties staying asleep: Denied. However, he did  previously acknowledge  occasionally waking up earlier than desired from time to time.  Feels rested and refreshed upon awakening: Endorsed "for the most part."    History of snoring: Endorsed. History of waking up gasping for air: Endorsed. Witnessed breath cessation while asleep: Endorsed. He previously acknowledged a history of obstructive sleep apnea. He had continued to use his CPAP machine nightly. He stated that he has been waiting on a new device to be shipped to him for over a year, attributing this delay to ongoing shortages.    History of vivid dreaming: Denied. Excessive movement while asleep: Denied. Instances of acting out his dreams: Denied.  Psychiatric/Behavioral Health History: Depression: Denied. His current mood was positive and he denied to his knowledge any prior mental health concerns or diagnoses. Current or remote suicidal ideation, intent, or plan was also denied.  Anxiety: Denied. Mania: Denied. Trauma History: Denied. Visual/auditory hallucinations: Denied. Delusional thoughts: Denied.   Tobacco: Denied. Alcohol: He denied current alcohol consumption as well as a history of problematic alcohol abuse or dependence.  Recreational drugs: Denied. Caffeine: 2-3 cups of coffee in the morning.   Family History: Problem Relation Age of Onset   Colon cancer Father 76   Diabetes Father        type I   Alzheimer's disease Mother    Cancer Other        colon   Rectal cancer Maternal Uncle    Colon cancer Paternal Grandmother 54   Esophageal cancer Neg Hx    Stomach cancer Neg Hx    This information was confirmed by Mr. Campoverde.  Academic/Vocational History: Highest level of educational attainment: 18 years. Mr. Mazmanian was born in [place] and spent time in [place] prior to coming to the [place]. While he completed the equivalent of high school overseas, he had to repeat the final two years of high school when coming to the [place] due to him "not knowing who Ginnie Smart was." His  first language was [place]. However, he speaks [place] fluently and considers it his primary language. 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 He described himself as a strong (A/B) student in academic settings. English was described as a relative weakness earlier in academic settings.  History of developmental delay: Denied. History of grade repetition: Denied. Enrollment in special education courses: Denied. History of LD/ADHD: Denied.   Employment: Retired. 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   Evaluation Results:   Behavioral Observations: Walter [name] was accompanied by his wife, arrived to his appointment on time, and was appropriately dressed and groomed. He appeared alert and oriented. Observed gait and station were within normal limits. Gross motor functioning appeared intact upon informal observation and no abnormal movements (e.g., tremors) were noted. His affect was generally relaxed and positive. Spontaneous speech was fluent and word finding difficulties were not observed during interview. Thought processes were coherent, organized, and normal in content. Insight into his cognitive difficulties appeared adequate. During testing, sustained attention was appropriate. Task engagement was adequate and he persisted when challenged. Overall, Mr. Wierman was cooperative with the clinical interview and subsequent testing procedures.   Adequacy of Effort: The validity of neuropsychological testing is limited by the extent to which the individual being tested may be assumed to have exerted adequate effort during testing. Walter [name] expressed his intention to perform to the best of his abilities and exhibited adequate task  engagement and persistence. Scores across stand-alone and embedded performance validity measures were within expectation. As such, the results of the current evaluation are  believed to be a valid representation of Walter [name]'s current cognitive functioning.  Test Results: Mr. Allender was largely oriented at the time of the current evaluation. He was one day off when stating the current date.   Intellectual abilities based upon educational and vocational attainment were estimated to be in the average to above average range. Premorbid abilities were estimated to be within the average range based upon a single-word reading test.   Processing speed was below average to average. Basic attention was below average to above average. More complex attention (e.g., working memory) was exceptionally low. Executive functioning was variable, ranging from the exceptionally low to average normative ranges. Performance was in the average range on a task assessing safety and judgment.  Assessed receptive language abilities were below average. Mr. Shyne did not exhibit any difficulties comprehending task instructions and answered all questions asked of him appropriately. Assessed verbal fluency was largely below average to average, although there was some variability across semantic fluency. Confrontation naming was average on a screening measure but exceptionally low across a more comprehensive task.     Assessed visuospatial/visuoconstructional abilities were below average to average. Points were lost on his drawing of a clock due to mild numerical placement spacing errors, as well as incorrect hand placement. On his copy of a complex figure, he was stimulus bound, using the edge of the page for the outer rectangle of his drawing rather than actually drawing this aspect. He lost additional points for some mild spatial abnormalities, likely caused by him using up to the edge of the page for his outer rectangle and not having space to fully incorporate other aspects.   Learning (i.e., encoding) of novel verbal information was variable, ranging from the well below average to average normative  ranges. Spontaneous delayed recall (i.e., retrieval) of previously learned information was below average. Retention rates were 57% across a story learning task, 20% across a list learning task, and 33% across a figure drawing task. Performance across recognition tasks was exceptionally low across a list learning task but below average across other tasks, suggesting some evidence for information consolidation.   Results of emotional screening instruments suggested that recent symptoms of generalized anxiety were in the minimal range, while symptoms of depression were within normal limits. A screening instrument assessing recent sleep quality suggested the presence of minimal sleep dysfunction.  Tables of Scores:   Note: This summary of test scores accompanies the interpretive report and should not be considered in isolation without reference to the appropriate sections in the text. Descriptors are based on appropriate normative data and may be adjusted based on clinical judgment. Terms such as "Within Normal Limits" and "Outside Normal Limits" are used when a more specific description of the test score cannot be determined. Descriptors refer to the current evaluation only.         Percentile - Normative Descriptor > 98 - Exceptionally High 91-97 - Well Above Average 75-90 - Above Average 25-74 - Average 9-24 - Below Average 2-8 - Well Below Average < 2 - Exceptionally Low        Validity:    DESCRIPTOR   August 2021 Current    Dot Counting Test: --- --- --- Within Normal Limits  RBANS Effort Index: --- --- --- Within Normal Limits  D-KEFS Color Word Effort Index: --- --- --- Within Normal Limits  Orientation:       Raw Score Raw Score Percentile   NAB Orientation, Form 1 28/29 28/29 --- ---        Cognitive Screening:       Raw Score Raw Score Percentile   SLUMS: 22/30 20/30 --- ---        RBANS, Form A: Standard Score/ Scaled Score Standard Score/ Scaled Score Percentile    Total Score --- 75 5 Well Below Average  Immediate Memory --- 44 7 Well Below Average    List Learning --- 4 2 Well Below Average    Story Memory --- 8 25 Average  Visuospatial/Constructional --- 75 5 Well Below Average    Figure Copy --- 6 9 Below Average    Line Orientation --- 12/20 3-9 Below Average  Language --- 83 13 Below Average    Picture Naming --- 9/10 26-50 Average    Semantic Fluency --- 3 1 Exceptionally Low  Attention --- 106 66 Average    Digit Span --- 12 75 Above Average    Coding --- 10 50 Average  Delayed Memory --- 60 <1 Exceptionally Low    List Recall --- 1/10 10-16 Below Average    List Recognition --- 13/20 <2 Exceptionally Low    Story Recall --- 6 9 Below Average    Story Recognition --- 8/12 14-28 Below Average    Figure Recall --- 6 9 Below Average    Figure Recognition --- 3/8 6-20 Below Average        Intellectual Functioning:       Standard Score Standard Score Percentile   Test of Premorbid Functioning: 88 94 34 Average        Memory:      NAB Memory Module, Form 1: T Score T Score Percentile   List Learning        Total Trials 1-3 13/36 (30) --- --- ---    List B 2/12 (36) --- --- ---    Short Delay Free Recall 3/12 (29) --- --- ---    Long Delay Free Recall 5/12 (41) --- --- ---    Retention Percentage 167 (74) --- --- ---    Recognition Discriminability 4 (40) --- --- ---  Shape Learning        Total Trials 1-3 17/27 (56) --- --- ---    Delayed Recall 4/9 (43) --- --- ---    Retention Percentage 50 (34) --- --- ---    Recognition Discriminability 3 (35) --- --- ---  Story Learning        Immediate Recall 38/80 (42) --- --- ---    Delayed Recall 17/40 (40) --- --- ---    Retention Percentage 81 (47) --- --- ---  Daily Living Memory        Immediate Recall 24/51 (22) --- --- ---    Delayed Recall 3/17 (19) --- --- ---    Retention Percentage 27 (14) --- --- ---    Recognition Hits 4/10 (18) --- --- ---        Attention/Executive  Function:      Trail Making Test (TMT): Raw Score Raw Score (Scaled Score) Percentile     Part A 48 secs.,  0 errors 48 secs.,  1 error (9) 37 Average    Part B 122 secs.,  4 errors 162 secs.,  3 errors (8) 25 Average  *Based on [employer] Older Normative Studies (MOANS)             Scaled Score Scaled Score  Percentile   WAIS-IV Coding: 7 10 50 Average        NAB Attention Module, Form 1: T Score T Score Percentile     Digits Forward 45 38 12 Below Average    Digits Backwards 45 27 1 Exceptionally Low        D-KEFS Color-Word Interference Test: Raw Score Raw Score (Scaled Score) Percentile     Color Naming 44 secs. 45 secs. (6) 9 Below Average    Word Reading 23 secs. 25 secs. (11) 63 Average    Inhibition 90 secs. 106 secs. (8) 25 Average      Total Errors 1 error  4 errors (9) 37 Average    Inhibition/Switching 96 secs. 110 secs. (9) 37 Average      Total Errors 5 errors 16 errors (1) <1 Exceptionally Low        NAB Executive Functions Module, Form 1: T Score T Score Percentile     Judgment --- 50 50 Average        [place] Card Sorting Test: Raw Score Raw Score Percentile     Categories (trials) 2 (64) 0 (64) 6-10 Well Below Average    Total Errors 33 48 1 Exceptionally Low    Perseverative Errors 28 43 <1 Exceptionally Low    Non-Perseverative Errors 5 5 >99 Exceptionally High    Failure to Maintain Set 0 0 --- ---        Language:      Verbal Fluency Test: Raw Score Raw Score (T Score) Percentile     Phonemic Fluency (FAS) 28 31 (40) 16 Below Average    Animal Fluency 21 15 (43) 25 Average         NAB Language Module, Form 1: T Score T Score Percentile     Auditory Comprehension 29 39 14 Below Average    Naming 25/31 (27) 22/31 (24) <1 Exceptionally Low        Visuospatial/Visuoconstruction:       Raw Score Raw Score Percentile   Clock Drawing: 7/10 7/10 --- Within Normal Limits         Scaled Score Scaled Score Percentile   WAIS-IV Block Design: 10 10 50 Average         Mood and Personality:       Raw Score Raw Score Percentile   Geriatric Depression Scale: 2 0 --- Within Normal Limits  Geriatric Anxiety Scale: 5 5 --- Minimal    Somatic 3 3 --- Minimal    Cognitive 0 0 --- Minimal    Affective 2 2 --- Minimal        Additional Questionnaires:       Raw Score Raw Score Percentile   PROMIS Sleep Disturbance Questionnaire: 21 21 --- None to Slight   Informed Consent and Coding/Compliance:   The current evaluation represents a clinical evaluation for the purposes previously outlined by the referral source and is in no way reflective of a forensic evaluation.   Mr. Adamy was provided with a verbal description of the nature and purpose of the present neuropsychological evaluation. Also reviewed were the foreseeable risks and/or discomforts and benefits of the procedure, limits of confidentiality, and mandatory reporting requirements of this provider. The patient was given the opportunity to ask questions and receive answers about the evaluation. Oral consent to participate was provided by the patient.   This evaluation was conducted by Christia Reading, Ph.D., licensed clinical neuropsychologist. Mr. Gale Journey completed a clinical interview with Dr. Melvyn Novas, billed  as one unit [id number], and 140 minutes of cognitive testing and scoring, billed as one unit [phone] and four additional units [id number]. Psychometrist Cruzita Lederer, B.S., assisted Dr. Melvyn Novas with test administration and scoring procedures. As a separate and discrete service, Dr. Melvyn Novas spent a total of 160 minutes in interpretation and report writing billed as one unit [phone] and two units [id number].

## 2020-12-28 ENCOUNTER — Other Ambulatory Visit: Payer: Self-pay

## 2020-12-28 ENCOUNTER — Ambulatory Visit (INDEPENDENT_AMBULATORY_CARE_PROVIDER_SITE_OTHER): Payer: Medicare Other | Admitting: Psychology

## 2020-12-28 DIAGNOSIS — G3184 Mild cognitive impairment, so stated: Secondary | ICD-10-CM | POA: Diagnosis not present

## 2020-12-28 NOTE — Progress Notes (Signed)
   Neuropsychology Feedback Session [name] Department of Neurology  Reason for Referral:   Walter [name] is a 80 y.o. right-handed Caucasian male referred by Ellouise Newer, M.D., to characterize his current cognitive functioning and assist with diagnostic clarity and treatment planning in the context of a prior diagnosis of mild neurocognitive disorder and concerns for ongoing cognitive decline.   Feedback:   [name] completed a comprehensive neuropsychological evaluation on 12/22/2020. Please refer to that encounter for the full report and recommendations. Briefly, results suggested noted performance variability surrounding executive functioning, semantic fluency, confrontation naming, and encoding (i.e., encoding) and retrieval aspects of memory. Performance was generally appropriate relative to age-matched peers across processing speed, receptive language, phonemic fluency, and visuospatial abilities. Relative to his evaluation one year prior, his most notable areas of decline surrounded executive functioning and attention/concentration. These were generally mild overall. Regarding etiology, there remain some concerns surrounding Alzheimer's disease based upon memory dysfunction, coupled with deficits in confrontation naming, semantic fluency, and executive functioning. As such, I cannot rule out this diagnosis. However, despite scores suggesting delayed memory falling in the exceptionally low normative range, he was able to demonstrate some appropriate retrieval/consolidation abilities, which would not suggest characteristic rapid forgetting. If Alzheimer's disease is indeed the underlying pathology, it appears to be progressing slowly, likely aided by current medication intervention and regular physical/mental stimulation.   [name] was accompanied by his wife during the current feedback session. Content of the current session focused on the results of his  neuropsychological evaluation. [name] was given the opportunity to ask questions and his questions were answered. He was encouraged to reach out should additional questions arise. A copy of his report was provided at the conclusion of the visit.      30 minutes were spent conducting the current feedback session with [name], billed as one unit [phone].

## 2020-12-29 ENCOUNTER — Encounter: Payer: Medicare Other | Admitting: Psychology

## 2020-12-31 ENCOUNTER — Telehealth: Payer: Self-pay | Admitting: Pulmonary Disease

## 2020-12-31 NOTE — Telephone Encounter (Signed)
PCC's  please advise if you have received any forms from [employer] for this pt to get a new cpap.  Thanks

## 2020-12-31 NOTE — Telephone Encounter (Signed)
I have called Melissa with [employer] and LM on VM for her to call the office back 01/01/21

## 2021-01-01 NOTE — Telephone Encounter (Signed)
I havent received anything from [facility].  There are 2 messages on this patient on the same thing I will close this message.

## 2021-01-01 NOTE — Telephone Encounter (Signed)
Called and spoke with Melissa. She stated that they have the order that was placed last July but it has expired. Because of this, the patient will need to have another OV and new order for cpap machine. As soon as they have the new order with the current OV, they will go ahead and process it. Patient is currently scheduled to see AO in late September. She wanted to see if we could move up his appt to help expedite the order. Advised her that I would call the patient to see if I could get him scheduled sooner with an APP.   Called patient but he did not answer. I left a message for him to call us back.

## 2021-01-01 NOTE — Telephone Encounter (Signed)
Walter [name] from [facility] is returning phone call.

## 2021-01-01 NOTE — Telephone Encounter (Signed)
I havent received anything from [employer].  There are 2 messages on this patient on the same thing I will close this message.      Note    Walter [name], CMA routed conversation to [facility] P[facility] Pool 14 hours ago (5:53 PM)   Walter [name], CMA 14 hours ago (5:53 PM)   PCC's  please advise if you have received any forms from [employer] for this pt to get a new cpap.  Thanks        Note    Walter [name] routed conversation to [facility] Triage Pool 17 hours ago (3:08 PM)   Roel, [place] [phone]  Thana Farr M 17 hours ago (3:03 PM)   Pt states still trying to get new CPAP but [facility] is saying they have faxed [place] forms. The # to call for pt is [phone]   Incoming call

## 2021-01-12 ENCOUNTER — Ambulatory Visit: Payer: Medicare Other | Admitting: Pulmonary Disease

## 2021-01-12 ENCOUNTER — Encounter: Payer: Self-pay | Admitting: Pulmonary Disease

## 2021-01-12 ENCOUNTER — Ambulatory Visit: Payer: Medicare Other | Admitting: Primary Care

## 2021-01-12 ENCOUNTER — Other Ambulatory Visit: Payer: Self-pay

## 2021-01-12 VITALS — BP 136/82 | HR 71 | Temp 97.7°F | Ht 72.0 in | Wt 203.0 lb

## 2021-01-12 DIAGNOSIS — G4733 Obstructive sleep apnea (adult) (pediatric): Secondary | ICD-10-CM

## 2021-01-12 NOTE — Patient Instructions (Signed)
Patient doing well with CPAP  Continues to benefit from CPAP  Will follow-up on a yearly basis  DME referral for auto CPAP 5-15

## 2021-01-12 NOTE — Progress Notes (Signed)
Walter [name]    [id number]    04-16-1941  Primary Care Physician:Hunter, Walter Mars, MD  Referring Physician: [name], MD [name],  [place] [address]  Chief complaint:   Patient with a history of obstructive sleep apnea  HPI:  Obstructive sleep apnea was diagnosed over 20 years ago  Difficulty with getting a new machine recently Sleep study that showed mild obstructive sleep apnea August 2021 Prescription sent to [facility] but has not been able to get a new machine so far  Continues to use his current machine Sleeping well Waking up feeling like is at a good nights rest  Current machine is over 42 years old Is sleep apnea for over 20 years Usually never goes to bed without his CPAP  Usually goes to bed about 11 PM, falls asleep immediately Final wake up time 6:30 AM  Continues to benefit from using CPAP on a regular basis  He has hypertension, diabetes Was treated for colon cancer 89  He feels well  Spouse is concerned that his sleep apnea is possibly suboptimally treated at present  Outpatient Encounter Medications as of 01/12/2021  Medication Sig   amLODipine (NORVASC) 5 MG tablet TAKE 1 TABLET BY MOUTH EVERY DAY   donepezil (ARICEPT) 5 MG tablet Take 1 tablet (5 mg total) by mouth at bedtime.   fluticasone (FLONASE) 50 MCG/ACT nasal spray SPRAY 2 SPRAYS INTO EACH NOSTRIL EVERY DAY   glipiZIDE (GLUCOTROL) 10 MG tablet Take 1 tablet (10 mg total) by mouth 2 (two) times daily before a meal.   JANUVIA 100 MG tablet TAKE 1 TABLET BY MOUTH EVERY DAY   metFORMIN (GLUCOPHAGE) 1000 MG tablet TAKE 1 TABLET BY MOUTH 2 TIMES DAILY WITH A MEAL.   Multiple Vitamins-Minerals (CENTRUM SILVER PO) Take 1 tablet by mouth daily.    ONETOUCH ULTRA test strip USE ONCE DAILY AS INSTRUCTED   rosuvastatin (CRESTOR) 20 MG tablet TAKE 1 TABLET BY MOUTH ONCE A WEEK   telmisartan (MICARDIS) 40 MG tablet TAKE 1 TABLET BY MOUTH EVERY DAY   No  facility-administered encounter medications on file as of 01/12/2021.    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 All started after a hemorrhoid surgery and later with urinary retention. Patient at one point was septic from urinary issues.     Diabetes mellitus type II, controlled 04/23/2007    Metformin 1g BID, amaryl '4mg'$ --> '8mg'$ , had to add [place] back  Never took victoza.   Januvia-lethargic and dizzy in past. 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   History of shingles    Hyperlipidemia associated with type 2 diabetes mellitus 04/23/2007   Atorvastatin '20mg'$  once weekly     Hypertension associated with diabetes 04/23/2007   Amlodipine '5mg'$ , telmisartan '40mg'$   Iritis 12/17/2008   Shingles 2010    Mild neurocognitive disorder 12/23/2019   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   Obstructive sleep apnea 04/23/2007   uses CPAP nightly   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   Sepsis secondary to UTI 03/29/2015    Past Surgical History:  Procedure Laterality Date   AMPUTATION TOE Right 09/14/2016   Procedure: RIGHT 2ND TOE/RAY AMPUTATION;  Surgeon: Leandrew Koyanagi, MD;  Location: [facility];  Service: Orthopedics;  Laterality: Right;   Progress N/A 06/21/2015   Procedure: CYSTOSCOPY, CLOT EVACUATION, AND CAUTERIZATION OF PROSTATE FOSSA;  Surgeon: Carolan Clines, MD;  Location: [facility];  Service: Urology;  Laterality: N/A;   HEMORRHOID SURGERY     TRANSURETHRAL RESECTION OF PROSTATE N/A 05/23/2015   Procedure: TRANSURETHRAL RESECTION OF THE PROSTATE (TURP);  Surgeon: Irine Seal, MD;  Location: [facility];  Service: Urology;  Laterality: N/A;    Family History  Problem Relation Age of Onset   Colon cancer Father 8   Diabetes Father        type I   Alzheimer's disease Mother    Cancer Other        colon/fhx   Rectal cancer Maternal Uncle    Colon cancer Paternal Grandmother 63   Esophageal cancer Neg Hx    Stomach cancer Neg Hx     Social History   Socioeconomic History   Marital status: Married    Spouse name: Not on file   Number of children: Not on file   Years of education: 15   Highest education level: Master's degree (e.g., MA, MS, MEng, MEd, MSW, MBA)  Occupational History   Occupation: Retired  Tobacco Use   Smoking status: Former    Packs/day: 0.25    Years: 10.00    Pack years: 2.50    Types: Pipe, Cigarettes   Smokeless tobacco: Never  Vaping Use   Vaping Use: Never used  Substance and Sexual Activity   Alcohol use: Yes    Alcohol/week: 2.0 standard drinks    Types: 2 Cans of beer per week    Comment: social   Drug use: No   Sexual activity: Not on file  Other Topics Concern   Not on file  Social History Narrative   Married 1966. 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)      No religious beliefs affecting health care, no afterlife beliefs      Right Handed      Two Story Home   Social Determinants of Health   Financial  Resource Strain: Low Risk    Difficulty of Paying Living Expenses: Not hard at all  Food Insecurity: No Food Insecurity   Worried About Charity fundraiser in the Last Year: Never true   Arboriculturist in the Last Year: Never true  Transportation Needs: No Transportation Needs   Lack of Transportation (Medical): No   Lack of Transportation (Non-Medical): No  Physical Activity: Inactive   Days of Exercise per Week: 0 days   Minutes of Exercise per Session: 0 min  Stress: No Stress Concern Present   Feeling of Stress : Not  at all  Social Connections: Moderately Integrated   Frequency of Communication with Friends and Family: Three times a week   Frequency of Social Gatherings with Friends and Family: Once a week   Attends Religious Services: Never   Marine scientist or Organizations: Yes   Attends Music therapist: 1 to 4 times per year   Marital Status: Married  Human resources officer Violence: Not At Risk   Fear of Current or Ex-Partner: No   Emotionally Abused: No   Physically Abused: No   Sexually Abused: No    Review of Systems  Respiratory:  Positive for apnea.   Psychiatric/Behavioral:  Positive for sleep disturbance.   All other systems reviewed and are negative.  Vitals:   01/12/21 1201  BP: 136/82  Pulse: 71  Temp: 97.7 F (36.5 C)  SpO2: 97%     Physical Exam HENT:     Head: Normocephalic.     Mouth/Throat:     Mouth: Mucous membranes are moist.     Comments: Mallampati 3, crowded oropharynx Eyes:     General:        Right eye: No discharge.        Left eye: No discharge.  Cardiovascular:     Rate and Rhythm: Normal rate and regular rhythm.     Pulses: Normal pulses.     Heart sounds: Normal heart sounds. No murmur heard.   No friction rub.  Pulmonary:     Effort: Pulmonary effort is normal. No respiratory distress.     Breath sounds: Normal breath sounds. No stridor. No wheezing or rhonchi.  Musculoskeletal:     Cervical back: No  rigidity or tenderness.  Neurological:     Mental Status: He is alert.  Psychiatric:        Mood and Affect: Mood normal.  Assessment:  Mild obstructive sleep apnea  Oral venting is much better with new mask   Current machine is dated and does need a new machine DME referral for CPAP  Plan/Recommendations: Prescription to DME company for auto CPAP 5-15  I will see him back in the office in about a year -Has been very compliant and dependent on his CPAP  Encouraged to call with any significant concerns   Sherrilyn Rist MD [facility] 01/12/2021, 12:21 PM  CC: [name], MD

## 2021-01-14 NOTE — Telephone Encounter (Signed)
Pt seen by Dr. Ander Slade on 01/12/21 with an order placed to DME. Will close encounter.

## 2021-01-15 ENCOUNTER — Other Ambulatory Visit: Payer: Medicare Other

## 2021-01-15 ENCOUNTER — Other Ambulatory Visit: Payer: Self-pay

## 2021-01-22 ENCOUNTER — Ambulatory Visit: Payer: Medicare Other | Admitting: Pulmonary Disease

## 2021-02-05 ENCOUNTER — Other Ambulatory Visit: Payer: Self-pay | Admitting: Podiatry

## 2021-02-05 ENCOUNTER — Other Ambulatory Visit: Payer: Self-pay | Admitting: Family Medicine

## 2021-02-23 ENCOUNTER — Ambulatory Visit: Payer: Medicare Other | Admitting: Pulmonary Disease

## 2021-02-26 NOTE — Progress Notes (Signed)
Phone: [phone]   Subjective:  Patient presents today for their annual physical. Chief complaint-noted.   See problem oriented charting- Review of Systems  Constitutional:  Negative for chills and fever.  HENT:  Negative for nosebleeds and sinus pain.   Eyes:  Negative for blurred vision and double vision.  Respiratory:  Negative for cough and shortness of breath.   Cardiovascular:  Negative for chest pain and palpitations.  Gastrointestinal:  Negative for abdominal pain, blood in stool, constipation, diarrhea, heartburn, melena, nausea and vomiting.  Genitourinary:  Negative for dysuria and frequency.  Musculoskeletal:  Negative for joint pain and myalgias.  Skin:  Negative for itching and rash.  Neurological:  Negative for dizziness and headaches.  Endo/Heme/Allergies:  Negative for polydipsia. Does not bruise/bleed easily.  Psychiatric/Behavioral:  Negative for depression and suicidal ideas.    The following were reviewed and entered/updated in epic: Past Medical History:  Diagnosis Date   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   S/p 2nd ray amputation 2018- started after prolonged use of corn pad   Allergic rhinitis 08/19/2019   flonase   BPH (benign prostatic hyperplasia) 07/02/2015   S/p TURP with multiple complications afterwards including recurrent hospitalizations. All started after a hemorrhoid surgery and later with urinary retention. Patient at one point was septic from urinary issues.     Diabetes mellitus type II, controlled 04/23/2007    Metformin 1g BID, amaryl 4mg --> 8mg , had to add [place] back  Never took victoza.   Januvia-lethargic and dizzy in past. 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   History of colon cancer    Tubular adenoma of colon; s/p colectomy 1992    History of polymyalgia rheumatica  10/17/2017   History of shingles    Hyperlipidemia associated with type 2 diabetes mellitus 04/23/2007   Atorvastatin 20mg  once weekly     Hypertension associated with diabetes 04/23/2007   Amlodipine 5mg , telmisartan 40mg      Iritis 12/17/2008   Shingles 2010    Mild neurocognitive disorder 12/23/2019   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   Obstructive sleep apnea 04/23/2007   uses CPAP nightly   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   Sepsis secondary to UTI 03/29/2015   Patient Active Problem List   Diagnosis Date Noted   Mild neurocognitive disorder 12/23/2019    Priority: High   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    Priority: High   BPH (benign prostatic hyperplasia) 07/02/2015    Priority: High   Diabetes mellitus type II, controlled 04/23/2007    Priority: High   History of colon cancer 01/27/2014    Priority: Medium    Hyperlipidemia associated with type 2 diabetes mellitus 04/23/2007    Priority: Medium    Obstructive sleep apnea 04/23/2007    Priority: Medium    Hypertension associated with diabetes 04/23/2007    Priority: Medium    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    Priority: Low   Hallux valgus of right foot 03/26/2014    Priority: Low   Iritis 12/17/2008    Priority: Low   Postherpetic neuralgia 05/22/2008    Priority: Low   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    Priority: Low   Bigeminy 04/21/2008    Priority: Low   Allergic rhinitis 08/19/2019   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   Past Surgical  History:  Procedure Laterality Date   AMPUTATION TOE Right 09/14/2016   Procedure: RIGHT 2ND TOE/RAY AMPUTATION;  Surgeon: Leandrew Koyanagi, MD;  Location: [facility];  Service: Orthopedics;  Laterality: Right;   [place] N/A 06/21/2015   Procedure: CYSTOSCOPY, CLOT EVACUATION, AND CAUTERIZATION OF PROSTATE FOSSA;  Surgeon: Carolan Clines, MD;  Location[facility];   Service: Urology;  Laterality: N/A;   HEMORRHOID SURGERY     TRANSURETHRAL RESECTION OF PROSTATE N/A 05/23/2015   Procedure: TRANSURETHRAL RESECTION OF THE PROSTATE (TURP);  Surgeon: Irine Seal, MD;  Location: [facility];  Service: Urology;  Laterality: N/A;    Family History  Problem Relation Age of Onset   Colon cancer Father 26   Diabetes Father        type I   Alzheimer's disease Mother    Cancer Other        colon/fhx   Rectal cancer Maternal Uncle    Colon cancer Paternal Grandmother 49   Esophageal cancer Neg Hx    Stomach cancer Neg Hx     Medications- reviewed and updated Current Outpatient Medications  Medication Sig Dispense Refill   amLODipine (NORVASC) 5 MG tablet TAKE 1 TABLET BY MOUTH EVERY DAY 90 tablet 3   donepezil (ARICEPT) 5 MG tablet Take 1 tablet (5 mg total) by mouth at bedtime. 90 tablet 3   fluticasone (FLONASE) 50 MCG/ACT nasal spray SPRAY 2 SPRAYS INTO EACH NOSTRIL EVERY DAY 48 mL 1   glipiZIDE (GLUCOTROL) 10 MG tablet Take 1 tablet (10 mg total) by mouth 2 (two) times daily before a meal. 180 tablet 3   JANUVIA 100 MG tablet TAKE 1 TABLET BY MOUTH EVERY DAY 90 tablet 1   metFORMIN (GLUCOPHAGE) 1000 MG tablet TAKE 1 TABLET BY MOUTH 2 TIMES DAILY WITH A MEAL. 180 tablet 3   Multiple Vitamins-Minerals (CENTRUM SILVER PO) Take 1 tablet by mouth daily.      ONETOUCH ULTRA test strip USE ONCE DAILY AS INSTRUCTED 100 strip 3   rosuvastatin (CRESTOR) 20 MG tablet TAKE 1 TABLET BY MOUTH ONCE A WEEK 13 tablet 3   telmisartan (MICARDIS) 40 MG tablet TAKE 1 TABLET BY MOUTH EVERY DAY 90 tablet 3   No current facility-administered medications for this visit.    Allergies-reviewed and updated Allergies  Allergen Reactions   Bactrim [Sulfamethoxazole-Trimethoprim] Nausea And Vomiting    Pt has chills and fevers also.    Social History   Social History Narrative   Married 1966. 2 daughters Sharee Pimple divorced lives in [place] works for [employer] no kids and Mateo Flow never  married in [place], [place] working for [employer] for [employer] improvement no kids.       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 Oropharynx normal Neck: no thyromegaly CV: RRR no murmurs rubs or  gallops Lungs: CTAB no crackles, wheeze, rhonchi Abdomen: soft/nontender/nondistended/normal bowel sounds. No rebound or guarding.  Ext: no edema Skin: warm, dry Neuro: grossly normal, moves all extremities, PERRLA  Diabetic Foot Exam - Simple   Simple Foot Form Diabetic Foot exam was performed with the following findings: Yes 03/02/2021 10:04 AM  Visual Inspection See comments: Yes Sensation Testing Intact to touch and monofilament testing bilaterally: Yes Pulse Check Posterior Tibialis and Dorsalis pulse intact bilaterally: Yes Comments Amputation 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. Left great toe some callous buildup stable on left big toe medially.        Assessment and Plan  80 y.o. male presenting for annual physical.  Health Maintenance counseling: 1. Anticipatory guidance: Patient counseled regarding regular dental exams -q6 months, eye exams -yearly- we are calling to get records,  avoiding smoking and second hand smoke, limiting alcohol to 2 beverages per day- well under this. No illicit drugs  2. Risk factor reduction:  Advised patient of need for regular exercise and diet rich and fruits and vegetables to reduce risk of heart attack and stroke. Exercise- waiting for new shoe to be able  to get back into his walking regularly (doing some) due to podiatry issues- this has been a set back- recommended 150 minutes a week.  Diet-reasonably healthy. weight is stable Wt Readings from Last 3 Encounters:  03/02/21 203 lb 6.1 oz (92.3 kg)  01/12/21 203 lb (92.1 kg)  10/27/20 199 lb 6.4 oz (90.4 kg)   3. Immunizations/screenings/ancillary studies DISCUSSED:  -Flu vaccination (last one 09/21) - had last week -Shingrix vaccination #1- recommended at [place] exam (last on 09/21) - getting recrdos Immunization History  Administered Date(s) Administered   Fluad Quad(high Dose 65+) 01/08/2019, 01/16/2020, 02/25/2021   Influenza Split 01/31/2011, 01/16/2012   Influenza Whole 03/07/2007, 01/31/2008, 01/30/2009   Influenza, High Dose Seasonal PF 01/11/2017, 02/05/2018   Influenza,inj,Quad PF,6+ Mos 01/21/2013, 01/27/2014, 12/26/2014   Influenza-Unspecified 01/05/2016   PFIZER(Purple Top)SARS-COV-2 Vaccination 05/24/2019, 06/14/2019, 02/02/2020   Pfizer Covid-19 Vaccine Bivalent Booster 62yrs & up 02/15/2021   Pneumococcal Conjugate-13 08/19/2013   Pneumococcal Polysaccharide-23 04/04/2007   Td 04/04/2007, 09/14/2017   Varicella 08/19/2013  4. Prostate cancer screening-  passed age based screening recommendations - no significant change in urinary symptoms recently and will hold off. History of TURP  Lab Results  Component Value Date   PSA 2.60 08/05/2013   PSA 2.35 12/03/2010   PSA 1.80 11/06/2009   5. Colon cancer screening -  history of colon cancer but has had excellent follow up for this after colectomy 1992- Dr. Fuller Plan said no further colonoscopy  6. Skin cancer screening- follows with derm regularly. mohs on wrist- due to skin cancer- also had another spot on back. advised regular sunscreen use. Denies worrisome, changing, or new skin lesions.  7. Smoking associated screening (lung cancer screening, AAA screen 65-75, UA)- Former smoker-quit over 40 years ago- no regular  screening needed 8. STD screening -  opts out as monogamous   Status of chronic or acute concerns   #OSA - now on cpap- has follow up soon for documentation  #dental pain- bit a chicken bone and went down harshly next to second molar. Saw dentist and overall was improving but irritated still- suggested considering antibiotic with PCP when he saw him 2 weeks ago- area has continued to heal further an. No purulence and pain improving- we opted to hold off on antibiotics unless worsening symptoms.   #hypertension S: medication: Telmisartan 40 mg every  day, amlodipine 5 mg every day Home readings #s: 120s to 130s over 80 typically BP Readings from Last 3 Encounters:  03/02/21 126/80  01/12/21 136/82  10/27/20 122/72  A/P: Slightly high on initial check today but home readings have been excellent overall-input home reading above.  Continue current medications  #hyperlipidemia S: Medication:Rosuvastatin 20 mg once a week Lab Results  Component Value Date   CHOL 134 02/20/2020   HDL 41 02/20/2020   LDLCALC 68 02/20/2020   LDLDIRECT 90.0 09/14/2017   TRIG 186 (H) 02/20/2020   CHOLHDL 3.3 02/20/2020   A/P: well controlled last year even on once weekly statin- update today and unlikely to change dose unless LDL over 100 likely  # Diabetes S: Medication: metformin 1000 mg twice daily , Januvia 100 mg every day, glipizide 10 mg twice daily CBGs- 163 this AM, usually 140-180 Lab Results  Component Value Date   HGBA1C 8.1 (H) 10/27/2020   HGBA1C 7.0 (A) 06/25/2020   HGBA1C 7.7 (H) 02/20/2020   A/P: poor control last visit and concern with fasting sugars may need stronger rx- may change januvia to ozempic perhaps if above 8.5   # Mild neurocognitive disorder- followed by Dr. Delice Lesch- still on Aricept. Stable recently.   Recommended follow up: Return in about 4 months (around 06/30/2021) for follow up- or sooner if needed. Future Appointments  Date Time Provider [place]  03/02/2021  11:15 AM Laurin Coder, MD [facility] None  03/05/2021  8:30 AM Cameron Sprang, MD [facility] None  04/22/2021 10:15 AM [facility] HEALTH COACH [facility]   Lab/Order associations:NOT fasting   ICD-10-CM   1. Preventative health care  Z00.00     2. Hyperlipidemia associated with type 2 diabetes mellitus  E11.69 CBC with Differential/Platelet   E78.5 Comprehensive metabolic panel    Lipid panel    3. Hypertension associated with diabetes  E11.59    I15.2     4. Controlled type 2 diabetes mellitus without complication, without long-term current use of insulin (HCC)  E11.9 Hemoglobin A1c    5. Obstructive sleep apnea  G47.33     6. Mild neurocognitive disorder  G31.84      I,Jada Bradford,acting as a scribe for [name], MD.,have documented all relevant documentation on the behalf of [name], MD,as directed by  [name], MD while in the presence of [name], MD.  I, [name], MD, have reviewed all documentation for this visit. The documentation on 03/02/21 for the exam, diagnosis, procedures, and orders are all accurate and complete.  Return precautions advised.  [name], MD

## 2021-03-02 ENCOUNTER — Ambulatory Visit: Payer: Medicare Other | Admitting: Pulmonary Disease

## 2021-03-02 ENCOUNTER — Encounter: Payer: Self-pay | Admitting: Family Medicine

## 2021-03-02 ENCOUNTER — Ambulatory Visit (INDEPENDENT_AMBULATORY_CARE_PROVIDER_SITE_OTHER): Payer: Medicare Other | Admitting: Family Medicine

## 2021-03-02 ENCOUNTER — Other Ambulatory Visit: Payer: Self-pay | Admitting: *Deleted

## 2021-03-02 ENCOUNTER — Other Ambulatory Visit: Payer: Self-pay

## 2021-03-02 ENCOUNTER — Encounter: Payer: Self-pay | Admitting: Pulmonary Disease

## 2021-03-02 VITALS — BP 126/80 | HR 73 | Temp 98.1°F | Ht 71.5 in | Wt 203.4 lb

## 2021-03-02 VITALS — BP 128/72 | HR 93 | Temp 98.2°F | Ht 71.5 in | Wt 204.6 lb

## 2021-03-02 DIAGNOSIS — G4733 Obstructive sleep apnea (adult) (pediatric): Secondary | ICD-10-CM

## 2021-03-02 DIAGNOSIS — Z9989 Dependence on other enabling machines and devices: Secondary | ICD-10-CM

## 2021-03-02 DIAGNOSIS — E119 Type 2 diabetes mellitus without complications: Secondary | ICD-10-CM

## 2021-03-02 DIAGNOSIS — E785 Hyperlipidemia, unspecified: Secondary | ICD-10-CM

## 2021-03-02 DIAGNOSIS — E1159 Type 2 diabetes mellitus with other circulatory complications: Secondary | ICD-10-CM

## 2021-03-02 DIAGNOSIS — E1169 Type 2 diabetes mellitus with other specified complication: Secondary | ICD-10-CM

## 2021-03-02 DIAGNOSIS — I152 Hypertension secondary to endocrine disorders: Secondary | ICD-10-CM

## 2021-03-02 DIAGNOSIS — G3184 Mild cognitive impairment, so stated: Secondary | ICD-10-CM

## 2021-03-02 DIAGNOSIS — Z0000 Encounter for general adult medical examination without abnormal findings: Secondary | ICD-10-CM | POA: Diagnosis not present

## 2021-03-02 DIAGNOSIS — Z23 Encounter for immunization: Secondary | ICD-10-CM

## 2021-03-02 LAB — CBC WITH DIFFERENTIAL/PLATELET
Basophils Absolute: 0 10*3/uL (ref 0.0–0.1)
Basophils Relative: 0.5 % (ref 0.0–3.0)
Eosinophils Absolute: 0.2 10*3/uL (ref 0.0–0.7)
Eosinophils Relative: 1.6 % (ref 0.0–5.0)
HCT: 41.2 % (ref 39.0–52.0)
Hemoglobin: 13.7 g/dL (ref 13.0–17.0)
Lymphocytes Relative: 12.5 % (ref 12.0–46.0)
Lymphs Abs: 1.2 10*3/uL (ref 0.7–4.0)
MCHC: 33.3 g/dL (ref 30.0–36.0)
MCV: 93.1 fl (ref 78.0–100.0)
Monocytes Absolute: 0.7 10*3/uL (ref 0.1–1.0)
Monocytes Relative: 7.4 % (ref 3.0–12.0)
Neutro Abs: 7.4 10*3/uL (ref 1.4–7.7)
Neutrophils Relative %: 78 % — ABNORMAL HIGH (ref 43.0–77.0)
Platelets: 224 10*3/uL (ref 150.0–400.0)
RBC: 4.43 Mil/uL (ref 4.22–5.81)
RDW: 13.9 % (ref 11.5–15.5)
WBC: 9.4 10*3/uL (ref 4.0–10.5)

## 2021-03-02 LAB — COMPREHENSIVE METABOLIC PANEL
ALT: 21 U/L (ref 0–53)
AST: 16 U/L (ref 0–37)
Albumin: 4.4 g/dL (ref 3.5–5.2)
Alkaline Phosphatase: 70 U/L (ref 39–117)
BUN: 18 mg/dL (ref 6–23)
CO2: 29 mEq/L (ref 19–32)
Calcium: 9.7 mg/dL (ref 8.4–10.5)
Chloride: 103 mEq/L (ref 96–112)
Creatinine, Ser: 0.89 mg/dL (ref 0.40–1.50)
GFR: 81.06 mL/min (ref >60.00)
Glucose, Bld: 137 mg/dL — ABNORMAL HIGH (ref 70–99)
Potassium: 4.5 mEq/L (ref 3.5–5.1)
Sodium: 140 mEq/L (ref 135–145)
Total Bilirubin: 0.6 mg/dL (ref 0.2–1.2)
Total Protein: 7 g/dL (ref 6.0–8.3)

## 2021-03-02 LAB — LIPID PANEL
Cholesterol: 139 mg/dL (ref 0–200)
HDL: 42.6 mg/dL (ref >39.00)
LDL Cholesterol: 68 mg/dL (ref 0–99)
NonHDL: 96.65
Total CHOL/HDL Ratio: 3
Triglycerides: 141 mg/dL (ref 0.0–149.0)
VLDL: 28.2 mg/dL (ref 0.0–40.0)

## 2021-03-02 LAB — HEMOGLOBIN A1C: Hgb A1c MFr Bld: 8.3 % — ABNORMAL HIGH (ref 4.6–6.5)

## 2021-03-02 MED ORDER — FLUTICASONE PROPIONATE 50 MCG/ACT NA SUSP: NASAL | 2 refills | Status: DC

## 2021-03-02 NOTE — Progress Notes (Signed)
Walter [name]    [id number]    1941/04/02  Primary Care Physician:Hunter, [name], MD  Referring Physician: [name], MD [place],  [place] [address]  Chief complaint:   Patient with a history of obstructive sleep apnea  HPI:  Obstructive sleep apnea was diagnosed over 20 years ago  Doing well with his new machine Uses the machine nightly Sleeping well Waking up feeling like is at a good nights rest  Diagnosed with mild obstructive sleep apnea August 2021 Has been using her machine since September 2022  Current machine is over 35 years old Is sleep apnea for over 20 years Usually never goes to bed without his CPAP  Usually goes to bed about 11 PM, falls asleep immediately Final wake up time 6:30 AM  Continues to benefit from using CPAP on a regular basis  He has hypertension, diabetes Was treated for colon cancer 89  He feels well  Spouse is concerned that his sleep apnea is possibly suboptimally treated at present  Outpatient Encounter Medications as of 01/12/2021  Medication Sig   amLODipine (NORVASC) 5 MG tablet TAKE 1 TABLET BY MOUTH EVERY DAY   donepezil (ARICEPT) 5 MG tablet Take 1 tablet (5 mg total) by mouth at bedtime.   fluticasone (FLONASE) 50 MCG/ACT nasal spray SPRAY 2 SPRAYS INTO EACH NOSTRIL EVERY DAY   glipiZIDE (GLUCOTROL) 10 MG tablet Take 1 tablet (10 mg total) by mouth 2 (two) times daily before a meal.   JANUVIA 100 MG tablet TAKE 1 TABLET BY MOUTH EVERY DAY   metFORMIN (GLUCOPHAGE) 1000 MG tablet TAKE 1 TABLET BY MOUTH 2 TIMES DAILY WITH A MEAL.   Multiple Vitamins-Minerals (CENTRUM SILVER PO) Take 1 tablet by mouth daily.    ONETOUCH ULTRA test strip USE ONCE DAILY AS INSTRUCTED   rosuvastatin (CRESTOR) 20 MG tablet TAKE 1 TABLET BY MOUTH ONCE A WEEK   telmisartan (MICARDIS) 40 MG tablet TAKE 1 TABLET BY MOUTH EVERY DAY   No facility-administered encounter medications on file as of 01/12/2021.    Allergies  as of 01/12/2021 - Review Complete 01/12/2021  Allergen Reaction Noted   Bactrim [sulfamethoxazole-trimethoprim] Nausea And Vomiting 01/11/2016    Past Medical History:  Diagnosis Date   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   S/p 2nd ray amputation 2018- started after prolonged use of corn pad   Allergic rhinitis 08/19/2019   flonase   BPH (benign prostatic hyperplasia) 07/02/2015   S/p TURP with multiple complications afterwards including recurrent hospitalizations. All started after a hemorrhoid surgery and later with urinary retention. Patient at one point was septic from urinary issues.     Diabetes mellitus type II, controlled 04/23/2007    Metformin 1g BID, amaryl 4mg --> 8mg , had to add [place] back  Never took victoza.   Januvia-lethargic and dizzy in past. 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   History of colon cancer    Tubular adenoma of colon; s/p colectomy 80    History of polymyalgia rheumatica 80/18/2019   History of shingles    Hyperlipidemia associated with type 2 diabetes mellitus 04/23/2007   Atorvastatin 20mg  once weekly     Hypertension associated with diabetes 04/23/2007   Amlodipine 5mg , telmisartan 40mg      Iritis 12/17/2008   Shingles 2010    Mild  neurocognitive disorder 12/23/2019   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   Obstructive sleep apnea 80/22/2008   80s CPAP nightly   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   Sepsis secondary to UTI 03/29/2015    Past Surgical History:  Procedure Laterality Date   AMPUTATION TOE Right 09/14/2016   Procedure: RIGHT 2ND TOE/RAY AMPUTATION;  Surgeon: Leandrew Koyanagi, MD;  Location: [facility];  Service: Orthopedics;  Laterality: Right;   [place] N/A 06/21/2015   Procedure: CYSTOSCOPY, CLOT EVACUATION, AND  CAUTERIZATION OF PROSTATE FOSSA;  Surgeon: Carolan Clines, MD;  [facility];  Service: Urology;  Laterality: N/A;   HEMORRHOID SURGERY     TRANSURETHRAL RESECTION OF PROSTATE N/A 05/23/2015   Procedure: TRANSURETHRAL RESECTION OF THE PROSTATE (TURP);  Surgeon: Irine Seal, MD;  [facility];  Service: Urology;  Laterality: N/A;    Family History  Problem Relation Age of Onset   Colon cancer Father 79   Diabetes Father        type I   Alzheimer's disease Mother    Cancer Other        colon/fhx   Rectal cancer Maternal Uncle    Colon cancer Paternal Grandmother 66   Esophageal cancer Neg Hx    Stomach cancer Neg Hx     Social History   Socioeconomic History   Marital status: Married    Spouse name: Not on file   Number of children: Not on file   Years of education: 62   Highest education level: Master's degree (e.g., MA, MS, MEng, MEd, MSW, MBA)  Occupational History   Occupation: Retired  Tobacco Use   Smoking status: Former    Packs/day: 0.25    Years: 10.00    Pack years: 2.50    Types: Pipe, Cigarettes   Smokeless tobacco: Never  Vaping Use   Vaping Use: Never used  Substance and Sexual Activity   Alcohol use: Yes    Alcohol/week: 2.0 standard drinks    Types: 2 Cans of beer per week    Comment: social   Drug use: No   Sexual activity: Not on file  Other Topics Concern   Not on file  Social History Narrative   Married 1966. 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)      No religious beliefs affecting health care, no afterlife beliefs      Right Handed      Two Story Home   Social Determinants of Health   Financial Resource Strain: Low Risk    Difficulty of Paying Living Expenses: Not hard at all   Food Insecurity: No Food Insecurity   Worried About Charity fundraiser in the Last Year: Never true   Arboriculturist in the Last Year: Never true  Transportation Needs: No Transportation Needs   Lack of Transportation (Medical): No   Lack of Transportation (Non-Medical): No  Physical Activity: Inactive   Days of Exercise per Week: 0 days   Minutes of Exercise per Session: 0 min  Stress: No Stress Concern Present   Feeling of Stress : Not at all  Social Connections: Moderately Integrated   Frequency  of Communication with Friends and Family: Three times a week   Frequency of Social Gatherings with Friends and Family: Once a week   Attends Religious Services: Never   Marine scientist or Organizations: Yes   Attends Music therapist: 1 to 4 times per year   Marital Status: Married  Human resources officer Violence: Not At Risk   Fear of Current or Ex-Partner: No   Emotionally Abused: No   Physically Abused: No   Sexually Abused: No    Review of Systems  Respiratory:  Positive for apnea.   Psychiatric/Behavioral:  Positive for sleep disturbance.   All other systems reviewed and are negative.  Vitals:   01/12/21 1201  BP: 136/82  Pulse: 71  Temp: 97.7 F (36.5 C)  SpO2: 97%     Physical Exam HENT:     Head: Normocephalic.     Mouth/Throat:     Mouth: Mucous membranes are moist.  Eyes:     General:        Right eye: No discharge.  Cardiovascular:     Rate and Rhythm: Normal rate and regular rhythm.     Pulses: Normal pulses.     Heart sounds: Normal heart sounds. No murmur heard.   No friction rub.  Pulmonary:     Effort: Pulmonary effort is normal. No respiratory distress.     Breath sounds: Normal breath sounds. No stridor. No wheezing or rhonchi.  Musculoskeletal:     Cervical back: No rigidity or tenderness.  Neurological:     Mental Status: He is alert.  Psychiatric:        Mood and Affect: Mood normal.   Compliance shows 100%  compliance Average use of 7 hours 20 minutes Set 5-15 AHI 1.5  Assessment:  Mild obstructive sleep apnea  Has a new machine now which seems to be working well  Very good compliance Continues to benefit from CPAP use   Plan/Recommendations: Continue CPAP nightly  Call us with any significant concerns  Tentative follow-up in a year   Sherrilyn Rist MD [facility] 01/12/2021, 12:21 PM  CC: [name], MD

## 2021-03-02 NOTE — Patient Instructions (Addendum)
Health Maintenance Due  Topic Date Due   OPHTHALMOLOGY EXAM - we are trying to get records 01/19/2021   Please check with your pharmacy to see if they have the shingrix vaccine. If they do- please get this immunization and update [place] by phone call or mychart with dates you receive the vaccine  Please stop by lab before you go If you have mychart- we will send your results within 3 business days of [place] receiving them.  If you do not have mychart- we will call you about results within 5 business days of [place] receiving them.  *please also note that you will see labs on mychart as soon as they post. I will later go in and write notes on them- will say "notes from Dr. Yong Channel"  Recommended follow up: Return in about 4 months (around 06/30/2021) for follow up- or sooner if needed.

## 2021-03-02 NOTE — Patient Instructions (Signed)
Continue using CPAP on a nightly basis  I will see you back in a year  Call with significant concerns

## 2021-03-04 ENCOUNTER — Telehealth: Payer: Self-pay

## 2021-03-04 NOTE — Telephone Encounter (Signed)
Patient is returning a call about lab results.  

## 2021-03-05 ENCOUNTER — Other Ambulatory Visit: Payer: Self-pay

## 2021-03-05 ENCOUNTER — Telehealth: Payer: Self-pay | Admitting: Pulmonary Disease

## 2021-03-05 ENCOUNTER — Ambulatory Visit: Payer: Medicare Other | Admitting: Neurology

## 2021-03-05 ENCOUNTER — Encounter: Payer: Self-pay | Admitting: Neurology

## 2021-03-05 VITALS — BP 138/79 | HR 73 | Ht 72.0 in | Wt 202.8 lb

## 2021-03-05 DIAGNOSIS — G3184 Mild cognitive impairment, so stated: Secondary | ICD-10-CM | POA: Diagnosis not present

## 2021-03-05 MED ORDER — DONEPEZIL HCL 10 MG PO TABS: 10.0000 mg | ORAL_TABLET | Freq: Every day | ORAL | 3 refills | Status: DC

## 2021-03-05 NOTE — Telephone Encounter (Signed)
Pt viewed results and provider message on mychart

## 2021-03-05 NOTE — Progress Notes (Signed)
NEUROLOGY FOLLOW UP OFFICE NOTE  Walter [name] [id number] 1941-01-23  HISTORY OF PRESENT ILLNESS: I had the pleasure of seeing Walter [name] in follow-up in the neurology clinic on 03/05/2021. He is again accompanied by his wife who helps supplement the history today. The patient was last seen 8 months ago for memory loss.  Since his last visit, he underwent repeat Neuropsychological testing in August 2022 indicating Mild Neurocognitive Disorder. Compared to prior testing, there was decline in executive functioning, attention/concentration, ongoing weakness in verbal memory. It was felt that there was no significant decline compared to prior testing. 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 He continues on Donepezil 5mg  daily without side effects. He denies any driving difficulties, his wife notes that he is driving better now compared to before. He denies missing medications or bill payments. His wife notes he repeats stories. His wife became tearful relating mood changes, he is more angry, "just no patience," gets excited and upset very easily. He has always been very conscious of being on time, but now it is exaggerated. Sleep is good. No paranoia or hallucinations. He denies any headaches, dizziness, no falls.    History on Initial Assessment 11/25/2019: This is a 80 year old right-handed man with a history of hypertension, hyperlipidemia, diabetes, sleep apnea on CPAP, colon cancer s/p resection, presenting for evaluation of memory loss/word-finding difficulties. He reported symptoms started 2 years ago, MMSE 25/30 in PCP office last 08/2019. He and his wife started noticing more significant changes in the past 1 and 1/2 years. He endorses word-finding difficulties, he tries to tell something and the important part slips by him. He states it is not constant, however his wife says it happens a lot. He is searching for words  and thoughts, unable to remember names of things or restaurants. He is forgetting how to do things sometimes, this is not often, but one time he forgot how to put his car in park (had to push a button). He has had this car for 2 years. He repeats stories. He cannot focus when watching TV and she is speaking to him, unable to multitask. He says "huh" or "what did you say" a lot, and she does not think it is his hearing. He lives with his wife. He denies getting lost driving, no missed ills or medications. He denies misplacing things. His wife has also noticed personality changes. He has a short temper worse than before and gets very nasty. When he cannot think of a word, it makes him nervous and uptight. He gets angry and upset when criticized or when he does not agree. No paranoia or hallucinations, no prior psychiatric history. He states his mood is good. His mother had dementia. No history of significant head injuries. No alcohol use.  He had constipation which resolved with a stool softener. He denies any headaches, dizziness, diplopia, dysarthria/dysphagia, neck/back pain, focal numbness/tingling/weakness, bladder dysfunction, anosmia, or tremors. He sleeps like a baby with his CPAP machine. 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 All started after a hemorrhoid surgery and later with urinary retention. Patient at one point was septic from urinary issues.     Diabetes mellitus  type II, controlled 04/23/2007    Metformin 1g BID, amaryl 4mg --> 8mg , had to add [place] back  Never took victoza.   Januvia-lethargic and dizzy in past. 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   History of shingles    Hyperlipidemia associated with type 2 diabetes mellitus 04/23/2007   Atorvastatin 20mg  once weekly     Hypertension associated with diabetes 04/23/2007   Amlodipine 5mg , telmisartan 40mg      Iritis 12/17/2008   Shingles 2010    Mild neurocognitive disorder 12/23/2019   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 90 tablet 3   fluticasone (FLONASE) 50 MCG/ACT nasal spray SPRAY 2 SPRAYS INTO EACH NOSTRIL EVERY DAY 48 mL 2   glipiZIDE (GLUCOTROL) 10 MG tablet Take 1 tablet (10 mg total) by mouth 2 (two) times daily before a meal. 180 tablet 3   JANUVIA 100 MG tablet TAKE 1 TABLET BY MOUTH EVERY DAY 90 tablet 1   metFORMIN (GLUCOPHAGE) 1000 MG tablet TAKE 1 TABLET BY MOUTH 2 TIMES DAILY WITH A MEAL. 180 tablet 3   Multiple Vitamins-Minerals (CENTRUM SILVER PO) Take 1 tablet by mouth daily.      ONETOUCH ULTRA test strip USE ONCE DAILY AS INSTRUCTED 100 strip 3   rosuvastatin (CRESTOR) 20 MG tablet TAKE 1 TABLET BY MOUTH ONCE A WEEK 13 tablet 3   telmisartan (MICARDIS) 40 MG tablet TAKE 1 TABLET BY MOUTH EVERY DAY 90 tablet 3   No  current facility-administered medications on file prior to visit.    ALLERGIES: Allergies  Allergen Reactions   Bactrim [Sulfamethoxazole-Trimethoprim] Nausea And Vomiting    Pt has chills and fevers also.    FAMILY HISTORY: Family History  Problem Relation Age of Onset   Colon cancer Father 27   Diabetes Father        type I   Alzheimer's disease Mother    Cancer Other        colon/fhx   Rectal cancer Maternal Uncle    Colon cancer Paternal Grandmother 96   Esophageal cancer Neg Hx    Stomach cancer Neg Hx     SOCIAL HISTORY: Social History   Socioeconomic History   Marital status: Married    Spouse name: Not on file   Number of children: Not on file   Years of education: 18   Highest education level: Master's degree (e.g., MA, MS, MEng, MEd, MSW, MBA)  Occupational History   Occupation: Retired  Tobacco Use   Smoking status:  Former    Packs/day: 0.25    Years: 10.00    Pack years: 2.50    Types: Pipe, Cigarettes   Smokeless tobacco: Never  Vaping Use   Vaping Use: Never used  Substance and Sexual Activity   Alcohol use: Yes    Alcohol/week: 2.0 standard drinks    Types: 2 Cans of beer per week    Comment: social   Drug use: No   Sexual activity: Not Currently  Other Topics Concern   Not on file  Social History Narrative   Married 1966. 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)      No religious beliefs affecting health care, no afterlife beliefs      Right Handed      Two Story Home   Social Determinants of Health   Financial Resource Strain: Low Risk    Difficulty of Paying Living Expenses: Not hard at all  Food Insecurity: No Food Insecurity   Worried About Charity fundraiser in the Last Year:  Never true   Arboriculturist in the Last Year: Never true  Transportation Needs: No Transportation Needs   Lack of Transportation (Medical): No   Lack of Transportation (Non-Medical): No  Physical Activity: Inactive   Days of Exercise per Week: 0 days   Minutes of Exercise per Session: 0 min  Stress: No Stress Concern Present   Feeling of Stress : Not at all  Social Connections: Moderately Integrated   Frequency of Communication with Friends and Family: Three times a week   Frequency of Social Gatherings with Friends and Family: Once a week   Attends Religious Services: Never   Marine scientist or Organizations: Yes   Attends Music therapist: 1 to 4 times per year   Marital Status: Married  Human resources officer Violence: Not At Risk   Fear of Current or Ex-Partner: No   Emotionally Abused: No   Physically Abused: No   Sexually Abused: No     PHYSICAL EXAM: Vitals:   03/05/21 0828  BP: 138/79  Pulse: 73  SpO2: 96%   General: No acute distress Head:  Normocephalic/atraumatic Skin/Extremities: No rash, no edema Neurological Exam: alert and oriented to person, place, and time. No aphasia or dysarthria. Fund of knowledge is appropriate.  Attention and concentration are normal.   Cranial nerves: Pupils equal, round. Extraocular movements intact with no nystagmus. Visual fields full.  No facial asymmetry.  Motor: Bulk and tone normal, muscle strength 5/5 throughout with no pronator drift.   Finger to nose testing intact.  Gait narrow-based and steady, no ataxia.  Romberg negative.   IMPRESSION: This is a pleasant 80 yo RH man with a history of hypertension, hyperlipidemia, diabetes, sleep apnea on CPAP, colon cancer s/p resection, with memory loss. Repeat Neuropsychological evaluation in 11/2020 again showed Mild Cognitive Impairment, etiology still concerning for underlying Alzheimer's disease with vascular contribution. He is overall doing well with no difficulties  with complex tasks. We discussed increasing Donepezil to 10mg  daily. His wife expresses concern about increasing irritability/impatience, we discussed mood changes that occur with memory changes and consideration for starting an SSRI. They will update our office in 2-3 weeks, if no side effects on  higher dose of Donepezil, we will plan to start Lexapro 10mg  daily. Side effects discussed. Continue to monitor driving. We again discussed the importance of control of vascular risk factors, physical exercise, brain stimulation exercises, and MIND diet for overall brain health. Caregiver support provided, resources for support groups provided today. Follow-up with Memory Disorder PA Sharene Butters in 6 months, they know to call for any changes.    Thank you for allowing me to participate in his care.  Please do not hesitate to call for any questions or concerns.    Ellouise Newer, M.D.   CC: Dr. Yong Channel

## 2021-03-05 NOTE — Telephone Encounter (Signed)
Pt called back for lab results. Please Advise.

## 2021-03-05 NOTE — Patient Instructions (Addendum)
Good to see you.  Increase Donepezil to 10mg  daily. With your current bottle of Donepezil 5mg : take 2 tablets daily, then once done, start Donepezil 10mg : take 1 tablet daily  2. Contact our office in 2-3 weeks if no problems with higher dose of Donepezil, and we will plan to start Lexapro to help with mood  3. Follow-up in 6 months, call for any changes   RECOMMENDATIONS FOR ALL PATIENTS WITH MEMORY PROBLEMS: 1. Continue to exercise (Recommend 30 minutes of walking everyday, or 3 hours every week) 2. Increase social interactions - continue going to [place] and enjoy social gatherings with friends and family 3. Eat healthy, avoid fried foods and eat more fruits and vegetables 4. Maintain adequate blood pressure, blood sugar, and blood cholesterol level. Reducing the risk of stroke and cardiovascular disease also helps promoting better memory. 5. Avoid stressful situations. Live a simple life and avoid aggravations. Organize your time and prepare for the next day in anticipation. 6. Sleep well, avoid any interruptions of sleep and avoid any distractions in the bedroom that may interfere with adequate sleep quality 7. Avoid sugar, avoid sweets as there is a strong link between excessive sugar intake, diabetes, and cognitive impairment We discussed the Mediterranean diet, which has been shown to help patients reduce the risk of progressive memory disorders and reduces cardiovascular risk. This includes eating fish, eat fruits and green leafy vegetables, nuts like almonds and hazelnuts, walnuts, and also use olive oil. Avoid fast foods and fried foods as much as possible. Avoid sweets and sugar as sugar use has been linked to worsening of memory function.  There is always a concern of gradual progression of memory problems. If this is the case, then we may need to adjust level of care according to patient needs. Support, both to the patient and caregiver, should then be put into  place.      Mediterranean Diet  Why follow it? Research shows. Those who follow the Mediterranean diet have a reduced risk of heart disease  The diet is associated with a reduced incidence of Parkinson's and Alzheimer's diseases People following the diet may have longer life expectancies and lower rates of chronic diseases  The Dietary Guidelines for Americans recommends the Mediterranean diet as an eating plan to promote health and prevent disease  What Is the Mediterranean Diet?  Healthy eating plan based on typical foods and recipes of Mediterranean-style cooking The diet is primarily a plant based diet; these foods should make up a majority of meals   Starches - Plant based foods should make up a majority of meals - They are an important sources of vitamins, minerals, energy, antioxidants, and fiber - Choose whole grains, foods high in fiber and minimally processed items  - Typical grain sources include wheat, oats, barley, corn, brown rice, bulgar, farro, millet, polenta, couscous  - Various types of beans include chickpeas, lentils, fava beans, black beans, white beans   Fruits  Veggies - Large quantities of antioxidant rich fruits & veggies; 6 or more servings  - Vegetables can be eaten raw or lightly drizzled with oil and cooked  - Vegetables common to the traditional Mediterranean Diet include: artichokes, arugula, beets, broccoli, brussel sprouts, cabbage, carrots, celery, collard greens, cucumbers, eggplant, kale, leeks, lemons, lettuce, mushrooms, okra, onions, peas, peppers, potatoes, pumpkin, radishes, rutabaga, shallots, spinach, sweet potatoes, turnips, zucchini - Fruits common to the Mediterranean Diet include: apples, apricots, avocados, cherries, clementines, dates, figs, grapefruits, grapes, melons, nectarines, oranges, peaches, pears, pomegranates,  strawberries, tangerines  Fats - Replace butter and margarine with healthy oils, such as olive oil, canola oil, and tahini  -  Limit nuts to no more than a handful a day  - Nuts include walnuts, almonds, pecans, pistachios, pine nuts  - Limit or avoid candied, honey roasted or heavily salted nuts - Olives are central to the Mediterranean diet - can be eaten whole or used in a variety of dishes   Meats Protein - Limiting red meat: no more than a few times a month - When eating red meat: choose lean cuts and keep the portion to the size of deck of cards - Eggs: approx. 0 to 4 times a week  - Fish and lean poultry: at least 2 a week  - Healthy protein sources include, chicken, [place], lean beef, lamb - Increase intake of seafood such as tuna, salmon, trout, mackerel, shrimp, scallops - Avoid or limit high fat processed meats such as sausage and bacon  Dairy - Include moderate amounts of low fat dairy products  - Focus on healthy dairy such as fat free yogurt, skim milk, low or reduced fat cheese - Limit dairy products higher in fat such as whole or 2% milk, cheese, ice cream  Alcohol - Moderate amounts of red wine is ok  - No more than 5 oz daily for women (all ages) and men older than age 29  - No more than 10 oz of wine daily for men younger than 52  Other - Limit sweets and other desserts  - Use herbs and spices instead of salt to flavor foods  - Herbs and spices common to the traditional Mediterranean Diet include: basil, bay leaves, chives, cloves, cumin, fennel, garlic, lavender, marjoram, mint, oregano, parsley, pepper, rosemary, sage, savory, sumac, tarragon, thyme   It's not just a diet, it's a lifestyle:  The Mediterranean diet includes lifestyle factors typical of those in the region  Foods, drinks and meals are best eaten with others and savored Daily physical activity is important for overall good health This could be strenuous exercise like running and aerobics This could also be more leisurely activities such as walking, housework, yard-work, or taking the stairs Moderation is the key; a balanced and  healthy diet accommodates most foods and drinks Consider portion sizes and frequency of consumption of certain foods   Meal Ideas & Options:  Breakfast:  Whole wheat toast or whole wheat English muffins with peanut butter & hard boiled egg Steel cut oats topped with apples & cinnamon and skim milk  Fresh fruit: banana, strawberries, melon, berries, peaches  Smoothies: strawberries, bananas, greek yogurt, peanut butter Low fat greek yogurt with blueberries and granola  Egg white omelet with spinach and mushrooms Breakfast couscous: whole wheat couscous, apricots, skim milk, cranberries  Sandwiches:  Hummus and grilled vegetables (peppers, zucchini, squash) on whole wheat bread   Grilled chicken on whole wheat pita with lettuce, tomatoes, cucumbers or tzatziki  [place] salad on whole wheat bread: tuna salad made with greek yogurt, olives, red peppers, capers, green onions Garlic rosemary lamb pita: lamb sauted with garlic, rosemary, salt & pepper; add lettuce, cucumber, greek yogurt to pita - flavor with lemon juice and black pepper  Seafood:  Mediterranean grilled salmon, seasoned with garlic, basil, parsley, lemon juice and black pepper Shrimp, lemon, and spinach whole-grain pasta salad made with low fat greek yogurt  Seared scallops with lemon orzo  Seared tuna steaks seasoned salt, pepper, coriander topped with tomato mixture of olives,  tomatoes, olive oil, minced garlic, parsley, green onions and cappers  Meats:  Herbed greek chicken salad with kalamata olives, cucumber, feta  Red bell peppers stuffed with spinach, bulgur, lean ground beef (or lentils) & topped with feta   Kebabs: skewers of chicken, tomatoes, onions, zucchini, squash  [place] burgers: made with red onions, mint, dill, lemon juice, feta cheese topped with roasted red peppers Vegetarian Cucumber salad: cucumbers, artichoke hearts, celery, red onion, feta cheese, tossed in olive oil & lemon juice  Hummus and whole grain  pita points with a greek salad (lettuce, tomato, feta, olives, cucumbers, red onion) Lentil soup with celery, carrots made with vegetable broth, garlic, salt and pepper  Tabouli salad: parsley, bulgur, mint, scallions, cucumbers, tomato, radishes, lemon juice, olive oil, salt and pepper.

## 2021-03-05 NOTE — Telephone Encounter (Signed)
Compliance report reviewed showing 100% compliance Average use of 7 hours 20 minutes Machine set between 5 and 15 No significant leaks 95 percentile pressure of 8.9 AHI 1.5

## 2021-03-07 ENCOUNTER — Other Ambulatory Visit: Payer: Self-pay | Admitting: Podiatry

## 2021-03-07 ENCOUNTER — Other Ambulatory Visit: Payer: Self-pay | Admitting: Family Medicine

## 2021-03-08 NOTE — Telephone Encounter (Signed)
Please advise 

## 2021-03-11 ENCOUNTER — Encounter: Payer: Self-pay | Admitting: Neurology

## 2021-03-29 ENCOUNTER — Telehealth: Payer: Self-pay | Admitting: Podiatry

## 2021-03-29 ENCOUNTER — Telehealth: Payer: Self-pay

## 2021-03-29 NOTE — Telephone Encounter (Signed)
Pt  left message on Friday 11.25 checking on status of diabetic shoes.  I returned call and explained we have not gotten the needed documents from the pcp office yet and it has been faxed multiple times.  I explained that we have everything ordered just need the documents so they can make the inserts. I recommended to  pt I would fax it over again and to call the pcp office and see if they would get it signed for you asap. They are going to call the pcp office.

## 2021-03-29 NOTE — Telephone Encounter (Signed)
I received a voicemail from pt stating we probably have the wrong fax number for Dr Yong Channel and he asked me to fax it to [phone]..  I faxed it to that number and left message for pt that the wrong number is in the [facility] system as well and we are not able to change it , but I did fax to number he left.

## 2021-03-29 NOTE — Telephone Encounter (Signed)
Pt called stating that [facility] has been faxing over approval for orders for over a month. Pt stated that Dr Yong Channel is aware of what he is needing. Pt stated that he is confused on why Dr Yong Channel hasn't responded to the faxes. I gave pt correct fax number. Pt would like a call. Please Advise.

## 2021-03-30 NOTE — Telephone Encounter (Signed)
Called and spoke with [facility] and they are going to re fax the forms needed in order to get pt diabetic shoes.

## 2021-03-30 NOTE — Telephone Encounter (Signed)
Dr. Yong Channel office called back to give fax numbers for auth for diabetic shoes   Fax # [phone]           [phone]

## 2021-04-05 ENCOUNTER — Telehealth: Payer: Self-pay | Admitting: Family Medicine

## 2021-04-05 NOTE — Telephone Encounter (Signed)
Copied from [place] [phone]. Topic: Medicare AWV >> Apr 05, 2021 11:49 AM Harris-Coley, Hannah Beat wrote: Reason for CRM: LVM 04/05/21 at 11:49am to r/s AWV appt 04/22/21 [facility]

## 2021-04-09 ENCOUNTER — Ambulatory Visit: Payer: Medicare Other | Admitting: Family Medicine

## 2021-04-09 ENCOUNTER — Encounter: Payer: Self-pay | Admitting: Family Medicine

## 2021-04-09 ENCOUNTER — Other Ambulatory Visit: Payer: Self-pay

## 2021-04-09 VITALS — BP 138/72 | HR 81 | Temp 97.6°F | Ht 72.0 in | Wt 193.8 lb

## 2021-04-09 DIAGNOSIS — I152 Hypertension secondary to endocrine disorders: Secondary | ICD-10-CM

## 2021-04-09 DIAGNOSIS — E1159 Type 2 diabetes mellitus with other circulatory complications: Secondary | ICD-10-CM

## 2021-04-09 DIAGNOSIS — K122 Cellulitis and abscess of mouth: Secondary | ICD-10-CM | POA: Diagnosis not present

## 2021-04-09 MED ORDER — AMOXICILLIN-POT CLAVULANATE 875-125 MG PO TABS: 1.0000 | ORAL_TABLET | Freq: Two times a day (BID) | ORAL | 0 refills | Status: DC

## 2021-04-09 NOTE — Patient Instructions (Addendum)
Health Maintenance Due  Topic Date Due   OPHTHALMOLOGY EXAM - Team please call to check in with his eye doctor. He said he has signed to release last visit.  01/19/2021   Please take Augmentin for 10 days with food. Can cause diarrhea- If not better in 2 weeks, we will send an oral surgery referral.   Great job on your weight lost! Keep up the great work!   Recommended follow up: update me on any new, worsening or persistent symptoms.  Happy Holidays!

## 2021-04-09 NOTE — Progress Notes (Signed)
Phone [phone] In person visit   Subjective:   Walter [name] is a 80 y.o. year old very pleasant male patient who presents for/with See problem oriented charting Chief Complaint  Patient presents with   Abcess in mouth   abcess in mouth    Pt c/o abscess in mouth right bottom mouth next molar     This visit occurred during the SARS-CoV-2 public health emergency.  Safety protocols were in place, including screening questions prior to the visit, additional usage of staff PPE, and extensive cleaning of exam room while observing appropriate contact time as indicated for disinfecting solutions.   Past Medical History-  Patient Active Problem List   Diagnosis Date Noted   Mild neurocognitive disorder 12/23/2019    Priority: High   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    Priority: High   BPH (benign prostatic hyperplasia) 07/02/2015    Priority: High   Diabetes mellitus type II, controlled 04/23/2007    Priority: High   History of colon cancer 01/27/2014    Priority: Medium    Hyperlipidemia associated with type 2 diabetes mellitus 04/23/2007    Priority: Medium    Obstructive sleep apnea 04/23/2007    Priority: Medium    Hypertension associated with diabetes 04/23/2007    Priority: Medium    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    Priority: Low   Hallux valgus of right foot 03/26/2014    Priority: Low   Iritis 12/17/2008    Priority: Low   Postherpetic neuralgia 05/22/2008    Priority: Low   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    Priority: Low   Bigeminy 04/21/2008    Priority: Low   Allergic rhinitis 08/19/2019   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    Medications- reviewed and updated Current Outpatient Medications  Medication Sig Dispense Refill   amLODipine (NORVASC) 5 MG tablet TAKE 1 TABLET BY MOUTH EVERY DAY 90 tablet 3   amoxicillin-clavulanate (AUGMENTIN) 875-125 MG tablet Take 1 tablet by mouth 2 (two) times daily. 20 tablet  0   donepezil (ARICEPT) 10 MG tablet Take 1 tablet (10 mg total) by mouth at bedtime. 90 tablet 3   fluticasone (FLONASE) 50 MCG/ACT nasal spray SPRAY 2 SPRAYS INTO EACH NOSTRIL EVERY DAY 48 mL 2   glipiZIDE (GLUCOTROL) 10 MG tablet Take 1 tablet (10 mg total) by mouth 2 (two) times daily before a meal. 180 tablet 3   JANUVIA 100 MG tablet TAKE 1 TABLET BY MOUTH EVERY DAY 90 tablet 1   metFORMIN (GLUCOPHAGE) 1000 MG tablet TAKE 1 TABLET BY MOUTH 2 TIMES DAILY WITH A MEAL. 180 tablet 3   Multiple Vitamins-Minerals (CENTRUM SILVER PO) Take 1 tablet by mouth daily.      ONETOUCH ULTRA test strip USE ONCE DAILY AS INSTRUCTED 100 strip 3   rosuvastatin (CRESTOR) 20 MG tablet TAKE 1 TABLET BY MOUTH ONCE A WEEK 13 tablet 3   telmisartan (MICARDIS) 40 MG tablet TAKE 1 TABLET BY MOUTH EVERY DAY 90 tablet 3   No current facility-administered medications for this visit.     Objective:  BP 138/72   Pulse 81   Temp 97.6 F (36.4 C) (Temporal)   Ht 6' (1.829 m)   Wt 193 lb 12.8 oz (87.9 kg)   SpO2 96%   BMI 26.28 kg/m  Gen: NAD, resting comfortably Back left molar - note about 3 x 3 mm raised slightly fluctuant lesion with head (patient reports pus in this that recurs  after popping) CV: RRR no murmurs rubs or gallops Lungs: CTAB no crackles, wheeze, rhonchi Ext: no edema Skin: warm, dry     Assessment and Plan     # Abcess in mouth  S: from last note "bit a chicken bone and went down harshly next to second molar. Saw dentist and overall was improving but irritated still- suggested considering antibiotic with PCP when he saw him 2 weeks ago- area has continued to heal further an. No purulence and pain improving- we opted to hold off on antibiotics unless worsening symptoms.  "   Dentist thought it was not related to the teeth- he thinks there could be some lingering irritation from prior chicken bone and he recommended antibiotic from PCP  Gets a little pimple like area and some pain but  at moment after popping no pain- but tends to recur A/P: small abscess at base of back left molars- we will trial augmentin for 10 days but if does not resolve may get oral surgeons opinion- he will let us know in about 2 week   # Diabetes S: Medication:glipizide 10 mg BID, januvia 100mg  daily, metformin  1000mg  BID  CBGs- getting #s as low as 90s. Averaging between 110-125. Big improvement Exercise and diet- plus down 9 lbs on home scales -prefers not to do injections Lab Results  Component Value Date   HGBA1C 8.3 (H) 03/02/2021   HGBA1C 8.1 (H) 10/27/2020   HGBA1C 7.0 (A) 06/25/2020   A/P: blood sugar is improving with lifestyle changes- we agreed to monitor through next visit and if not significantly improved consider either ozempic or rybelsus though he seems to prefer oral option -need copy of diabetic eye exam  #hypertension S: medication: Telmisartan 40 mg every day, amlodipine 5 mg every day BP Readings from Last 3 Encounters:  04/09/21 138/72  03/05/21 138/79  03/02/21 128/72  A/P: Controlled. Continue current medications.   Recommended follow up: Return for as needed for new, worsening, persistent symptoms.   Future Appointments  Date Time Provider [place]  05/11/2021  9:30 AM [facility] HEALTH COACH [facility]  07/02/2021  8:00 AM [name], MD [facility]  09/02/2021 11:00 AM Rondel Jumbo, PA-C [facility] None  03/14/2022 10:00 AM Cameron Sprang, MD [facility] None    Lab/Order associations:   ICD-10-CM   1. Abscess of mouth  K12.2     2. Hypertension associated with diabetes  E11.59    I15.2      Meds ordered this encounter  Medications   amoxicillin-clavulanate (AUGMENTIN) 875-125 MG tablet    Sig: Take 1 tablet by mouth 2 (two) times daily.    Dispense:  20 tablet    Refill:  0    I,Jada Bradford,acting as a scribe for [name], MD.,have documented all relevant documentation on the behalf of [name], MD,as directed by   [name], MD while in the presence of [name], MD.  I, [name], MD, have reviewed all documentation for this visit. The documentation on 04/09/21 for the exam, diagnosis, procedures, and orders are all accurate and complete.  Return precautions advised.  [name], MD

## 2021-04-19 ENCOUNTER — Ambulatory Visit: Payer: Medicare Other

## 2021-04-19 ENCOUNTER — Other Ambulatory Visit: Payer: Self-pay

## 2021-04-19 DIAGNOSIS — M2011 Hallux valgus (acquired), right foot: Secondary | ICD-10-CM

## 2021-04-19 DIAGNOSIS — M216X1 Other acquired deformities of right foot: Secondary | ICD-10-CM

## 2021-04-19 DIAGNOSIS — E119 Type 2 diabetes mellitus without complications: Secondary | ICD-10-CM

## 2021-04-19 DIAGNOSIS — M216X2 Other acquired deformities of left foot: Secondary | ICD-10-CM

## 2021-04-19 NOTE — Progress Notes (Signed)
SITUATION Reason for Visit: Fitting of Diabetic Shoes & Insoles Patient / Caregiver Report:  Patient reports comfort  OBJECTIVE DATA: Patient History / Diagnosis:     ICD-10-CM   1. Controlled type 2 diabetes mellitus without complication, without long-term current use of insulin (HCC)  E11.9       Change in Status:   None  ACTIONS PERFORMED: In-Person Delivery, patient was fit with: - 1x pair [id number] PDAC approved prefabricated Diabetic Shoes: Orthofeet 427 12W - 3x pair [id number] PDAC approved CAM milled custom diabetic insoles  Shoes and insoles were verified for structural integrity and safety. Patient wore shoes and insoles in office. Skin was inspected and free of areas of concern after wearing shoes and inserts. Shoes and inserts fit properly. Patient / Caregiver provided with ferbal instruction and demonstration regarding donning, doffing, wear, care, proper fit, function, purpose, cleaning, and use of shoes and insoles ' and in all related precautions and risks and benefits regarding shoes and insoles. Patient / Caregiver was instructed to wear properly fitting socks with shoes at all times. Patient was also provided with verbal instruction regarding how to report any failures or malfunctions of shoes or inserts, and necessary follow up care. Patient / Caregiver was also instructed to contact physician regarding change in status that may affect function of shoes and inserts.   Patient / Caregiver verbalized undersatnding of instruction provided. Patient / Caregiver demonstrated independence with proper donning and doffing of shoes and inserts.  PLAN Patient to follow up as needed. Plan of care was discussed with and agreed upon by patient and/or caregiver. All questions were answered and concerns addressed.

## 2021-04-22 ENCOUNTER — Ambulatory Visit: Payer: Medicare Other

## 2021-04-27 ENCOUNTER — Encounter: Payer: Self-pay | Admitting: Family Medicine

## 2021-04-30 ENCOUNTER — Telehealth: Payer: Self-pay | Admitting: Family Medicine

## 2021-04-30 NOTE — Telephone Encounter (Signed)
Copied from [place] [phone]. Topic: Medicare AWV >> Apr 30, 2021  1:14 PM Harris-Coley, Hannah Beat wrote: Reason for CRM: LVM 04/30/21 to r/s 05/11/21 AWV appt [facility]. Please call Juliann Pulse [phone]

## 2021-05-04 ENCOUNTER — Ambulatory Visit (INDEPENDENT_AMBULATORY_CARE_PROVIDER_SITE_OTHER): Payer: Medicare Other

## 2021-05-04 ENCOUNTER — Other Ambulatory Visit: Payer: Self-pay

## 2021-05-04 DIAGNOSIS — Z0000 Encounter for general adult medical examination without abnormal findings: Secondary | ICD-10-CM

## 2021-05-04 NOTE — Progress Notes (Addendum)
Virtual Visit via Telephone Note  I connected with  Walter [name] on 05/04/21 at 10:15 AM EST by telephone and verified that I am speaking with the correct person using two identifiers.  Medicare Annual Wellness visit completed telephonically due to Covid-19 pandemic.   Persons participating in this call: This Health Coach and this patient.   Location: Patient: home Provider: office   I discussed the limitations, risks, security and privacy concerns of performing an evaluation and management service by telephone and the availability of in person appointments. The patient expressed understanding and agreed to proceed.  Unable to perform video visit due to video visit attempted and failed and/or patient does not have video capability.   Some vital signs may be absent or patient reported.   Willette Brace, LPN   Subjective:   Walter [name] is a 81 y.o. male who presents for Medicare Annual/Subsequent preventive examination.  Review of Systems     Cardiac Risk Factors include: advanced age (>56men, >6 women);hypertension;diabetes mellitus;dyslipidemia     Objective:    There were no vitals filed for this visit. There is no height or weight on file to calculate BMI.  Advanced Directives 05/04/2021 03/05/2021 04/13/2020 11/25/2019 03/11/2019 12/26/2017 09/14/2016  Does Patient Have a Medical Advance Directive? Yes Yes Yes Yes Yes Yes Yes  Type of Industrial/product designer of Attorney Living will [place];Living will;Out of facility DNR (pink MOST or yellow form) Living will;Healthcare Power of [place];Living will -  Does patient want to make changes to medical advance directive? - - - - No - Patient declined No - Patient declined -  Copy of [place] in Chart? No - copy requested - - - No - copy requested No - copy requested -  Would patient like information on creating a medical advance  directive? - - - - - - -    Current Medications (verified) Outpatient Encounter Medications as of 05/04/2021  Medication Sig   amLODipine (NORVASC) 5 MG tablet TAKE 1 TABLET BY MOUTH EVERY DAY   donepezil (ARICEPT) 10 MG tablet Take 1 tablet (10 mg total) by mouth at bedtime.   fluticasone (FLONASE) 50 MCG/ACT nasal spray SPRAY 2 SPRAYS INTO EACH NOSTRIL EVERY DAY   glipiZIDE (GLUCOTROL) 10 MG tablet Take 1 tablet (10 mg total) by mouth 2 (two) times daily before a meal.   JANUVIA 100 MG tablet TAKE 1 TABLET BY MOUTH EVERY DAY   metFORMIN (GLUCOPHAGE) 1000 MG tablet TAKE 1 TABLET BY MOUTH 2 TIMES DAILY WITH A MEAL.   Multiple Vitamins-Minerals (CENTRUM SILVER PO) Take 1 tablet by mouth daily.    ONETOUCH ULTRA test strip USE ONCE DAILY AS INSTRUCTED   rosuvastatin (CRESTOR) 20 MG tablet TAKE 1 TABLET BY MOUTH ONCE A WEEK   telmisartan (MICARDIS) 40 MG tablet TAKE 1 TABLET BY MOUTH EVERY DAY   [DISCONTINUED] amoxicillin-clavulanate (AUGMENTIN) 875-125 MG tablet Take 1 tablet by mouth 2 (two) times daily. (Patient not taking: Reported on 05/04/2021)   No facility-administered encounter medications on file as of 05/04/2021.    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  All started after a hemorrhoid surgery and later with urinary retention. Patient at one point was septic from urinary issues.     Diabetes mellitus type II, controlled 04/23/2007    Metformin 1g BID, amaryl 4mg --> 8mg , had to add [place] back  Never took victoza.   Januvia-lethargic and dizzy in past. 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   History of colon cancer    Tubular adenoma of colon; s/p colectomy 1992    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   History of shingles    Hyperlipidemia associated with type 2 diabetes mellitus 04/23/2007   Atorvastatin 20mg  once weekly     Hypertension associated with diabetes 04/23/2007   Amlodipine 5mg , telmisartan 40mg      Iritis 12/17/2008   Shingles 2010    Mild neurocognitive disorder 12/23/2019   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   Obstructive sleep apnea 04/23/2007   uses CPAP nightly   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   Sepsis secondary to UTI 03/29/2015   Past Surgical History:  Procedure Laterality Date   AMPUTATION TOE Right 09/14/2016   Procedure: RIGHT 2ND TOE/RAY AMPUTATION;  Surgeon: Leandrew Koyanagi, MD;  Location: [facility];  Service: Orthopedics;  Laterality: Right;   APPENDECTOMY  05/03/1947   COLECTOMY  05/02/1990   CYSTOSCOPY N/A 06/21/2015   Procedure: CYSTOSCOPY, CLOT EVACUATION, AND CAUTERIZATION OF PROSTATE FOSSA;  Surgeon: Carolan Clines, MD;  Location: [facility];  Service: Urology;  Laterality: N/A;   HEMORRHOID SURGERY     MOLE REMOVAL  2022   hand   TOE SURGERY  04/2020   TRANSURETHRAL RESECTION OF PROSTATE N/A 05/23/2015   Procedure: TRANSURETHRAL RESECTION OF THE PROSTATE (TURP);  Surgeon: Irine Seal, MD;  Location: [facility];  Service: Urology;  Laterality: N/A;   Family History  Problem Relation Age of Onset   Colon cancer Father 92   Diabetes Father        type I   Alzheimer's disease Mother    Cancer Other        colon/fhx   Rectal cancer Maternal Uncle    Colon cancer Paternal Grandmother 56   Esophageal cancer Neg Hx    Stomach cancer Neg Hx    Social History   Socioeconomic History   Marital status: Married    Spouse name: Not on file   Number of children: Not on file   Years of  education: 68   Highest education level: Master's degree (e.g., MA, MS, MEng, MEd, MSW, MBA)  Occupational History   Occupation: Retired  Tobacco Use   Smoking status: Former    Packs/day: 0.25    Years: 10.00    Pack years: 2.50    Types: Pipe, Cigarettes   Smokeless tobacco: Never  Vaping Use   Vaping Use: Never used  Substance and Sexual Activity   Alcohol use: Yes    Alcohol/week: 2.0 standard drinks    Types: 2 Cans of beer per week    Comment: social   Drug use: No   Sexual activity: Not Currently  Other Topics Concern   Not on file  Social History Narrative   Married 1966. 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)      No religious beliefs affecting health care, no afterlife beliefs      Right Handed      Two Story Home   Social Determinants of Health   Financial Resource Strain: Low Risk    Difficulty of Paying Living Expenses: Not hard at all  Food Insecurity: No Food Insecurity   Worried About Charity fundraiser in the Last Year: Never true   Arboriculturist in the Last Year: Never true  Transportation Needs: No Transportation Needs   Lack of Transportation (Medical): No   Lack of Transportation (Non-Medical): No  Physical Activity: Inactive   Days of Exercise per Week: 0 days   Minutes of Exercise per Session: 0 min  Stress: No Stress Concern Present   Feeling of Stress : Not at all  Social Connections: Moderately Integrated   Frequency of Communication with Friends and Family: Twice a week   Frequency of Social Gatherings with Friends and Family: More than three times a week   Attends Religious Services: Never   Marine scientist or Organizations: Yes   Attends Music therapist: 1 to 4  times per year   Marital Status: Married    Tobacco Counseling Counseling given: Not Answered   Clinical Intake:  Pre-visit preparation completed: Yes  Pain : No/denies pain     Nutritional Risks: None Diabetes: Yes CBG done?: Yes (119) CBG resulted in Enter/ Edit results?: No Did pt. bring in CBG monitor from home?: No  How often do you need to have someone help you when you read instructions, pamphlets, or other written materials from your doctor or pharmacy?: 1 - Never  Diabetic?Nutrition Risk Assessment:  Has the patient had any N/V/D within the last 2 months?  No  Does the patient have any non-healing wounds?  No  Has the patient had any unintentional weight loss or weight gain?  No   Diabetes:  Is the patient diabetic?  Yes  If diabetic, was a CBG obtained today?  Yes  Did the patient bring in their glucometer from home?  No  How often do you monitor your CBG's? Daily.   Financial Strains and Diabetes Management:  Are you having any financial strains with the device, your supplies or your medication? No .  Does the patient want to be seen by Chronic Care Management for management of their diabetes?  No  Would the patient like to be referred to a Nutritionist or for Diabetic Management?  No   Diabetic Exams:  Diabetic Eye Exam: Completed 12/18/20 Diabetic Foot Exam: Completed 03/02/21   Interpreter Needed?: No  Information entered by :: Charlott Rakes, LPN   Activities of Daily Living In your present state of health, do you have any difficulty performing the following activities: 05/04/2021  Hearing? N  Vision? N  Difficulty concentrating or making decisions? N  Walking or climbing stairs? N  Dressing or bathing? N  Doing errands, shopping? N  Preparing Food and eating ? N  Using the Toilet? N  In the past six months, have you accidently leaked urine? N  Do you have problems with loss of bowel control? N  Managing your Medications? N  Managing your  Finances? N  Housekeeping or managing your Housekeeping? N  Some recent data might be hidden    Patient Care Team: [name],  MD as PCP - General (Family Medicine) Michael Boston, MD as Consulting Physician (General Surgery) Ladene Artist, MD as Consulting Physician (Gastroenterology) Irine Seal, MD as Attending Physician (Urology) Shon Hough, MD as Consulting Physician (Ophthalmology) Cameron Sprang, MD as Consulting Physician (Neurology)  Indicate any recent Medical Services you may have received from other than [facility] providers in the past year (date may be approximate).     Assessment:   This is a routine wellness examination for Walter [name].  Hearing/Vision screen Hearing Screening - Comments:: Pt denies any hearing issues  Vision Screening - Comments:: Pt follows up with provider for annual eye exams   Dietary issues and exercise activities discussed: Current Exercise Habits: The patient does not participate in regular exercise at present   Goals Addressed             This Visit's Progress    Patient Stated       Get A1C to 6.0       Depression Screen PHQ 2/9 Scores 05/04/2021 03/02/2021 10/27/2020 04/13/2020 08/19/2019 03/11/2019 02/25/2019  PHQ - 2 Score 0 0 0 0 0 0 0    Fall Risk Fall Risk  05/04/2021 03/05/2021 10/27/2020 04/13/2020 11/25/2019  Falls in the past year? 0 0 0 0 0  Number falls in past yr: 0 0 - 0 0  Injury with Fall? 0 0 - 0 0  Risk for fall due to : Impaired vision - No Fall Risks - -  Follow up Falls prevention discussed - - Falls prevention discussed -    FALL RISK PREVENTION PERTAINING TO THE HOME:  Any stairs in or around the home? Yes  If so, are there any without handrails? No  Home free of loose throw rugs in walkways, pet beds, electrical cords, etc? Yes  Adequate lighting in your home to reduce risk of falls? Yes   ASSISTIVE DEVICES UTILIZED TO PREVENT FALLS:  Life alert? Yes  Use of a cane, walker or w/c? No  Grab bars  in the bathroom? Yes  Shower chair or bench in shower? Yes  Elevated toilet seat or a handicapped toilet? No   TIMED UP AND GO:  Was the test performed? No .   Cognitive Function:pt declined stated he just completed MMSE - Mini Mental State Exam 02/19/2016  Not completed: (No Data)     6CIT Screen 04/13/2020 03/11/2019 12/26/2017  What Year? 0 points 0 points 0 points  What month? 0 points 0 points 0 points  What time? - 0 points 0 points  Count back from 20 0 points 0 points 0 points  Months in reverse 0 points 0 points 0 points  Repeat phrase 2 points 0 points -  Total Score - 0 -    Immunizations Immunization History  Administered Date(s) Administered   Fluad Quad(high Dose 65+) 01/08/2019, 01/16/2020, 02/25/2021   Influenza Split 01/31/2011, 01/16/2012   Influenza Whole 03/07/2007, 01/31/2008, 01/30/2009   Influenza, High Dose Seasonal PF 01/11/2017, 02/05/2018   Influenza,inj,Quad PF,6+ Mos 01/21/2013, 01/27/2014, 12/26/2014   Influenza-Unspecified 01/05/2016   PFIZER(Purple Top)SARS-COV-2 Vaccination 05/24/2019, 06/14/2019, 02/02/2020   Pfizer Covid-19 Vaccine Bivalent Booster 26yrs & up 02/15/2021   Pneumococcal Conjugate-13 08/19/2013   Pneumococcal Polysaccharide-23 04/04/2007   Td 04/04/2007, 09/14/2017   Varicella 08/19/2013    TDAP status: Up to date  Flu Vaccine status: Up to date  Pneumococcal vaccine status: Up to date  Covid-19 vaccine status: Completed vaccines  Qualifies for Shingles Vaccine? Yes   Zostavax completed No  Shingrix Completed?: No.    Education has been provided regarding the importance of this vaccine. Patient has been advised to call insurance company to determine out of pocket expense if they have not yet received this vaccine. Advised may also receive vaccine at local pharmacy or Health Dept. Verbalized acceptance and understanding.  Screening Tests Health Maintenance  Topic Date Due   Zoster Vaccines- Shingrix (1 of 2)  06/02/2021 (Originally 11/19/1959)   HEMOGLOBIN A1C  08/30/2021   OPHTHALMOLOGY EXAM  12/18/2021   FOOT EXAM  03/02/2022   TETANUS/TDAP  09/15/2027   Pneumonia Vaccine 83+ Years old  Completed   INFLUENZA VACCINE  Completed   COVID-19 Vaccine  Completed   HPV VACCINES  Aged Out   COLONOSCOPY (Pts 45-83yrs Insurance coverage will need to be confirmed)  Discontinued    Health Maintenance  There are no preventive care reminders to display for this patient.  Colorectal cancer screening: No longer required.   Additional Screening:  Vision Screening: Recommended annual ophthalmology exams for early detection of glaucoma and other disorders of the eye. Is the patient up to date with their annual eye exam?  Yes  Who is the provider or what is the name of the office in which the patient attends annual eye exams? Unsure of name of practice If pt is not established with a provider, would they like to be referred to a provider to establish care? No .   Dental Screening: Recommended annual dental exams for proper oral hygiene  Community Resource Referral / Chronic Care Management: CRR required this visit?  No   CCM required this visit?  No      Plan:     I have personally reviewed and noted the following in the patients chart:   Medical and social history Use of alcohol, tobacco or illicit drugs  Current medications and supplements including opioid prescriptions. Patient is not currently taking opioid prescriptions. Functional ability and status Nutritional status Physical activity Advanced directives List of other physicians Hospitalizations, surgeries, and ER visits in previous 12 months Vitals Screenings to include cognitive, depression, and falls Referrals and appointments  In addition, I have reviewed and discussed with patient certain preventive protocols, quality metrics, and best practice recommendations. A written personalized care plan for preventive services as well  as general preventive health recommendations were provided to patient.     Willette Brace, LPN   12/08/2798   Nurse Notes: None

## 2021-05-04 NOTE — Patient Instructions (Addendum)
[name] , Thank you for taking time to come for your Medicare Wellness Visit. I appreciate your ongoing commitment to your health goals. Please review the following plan we discussed and let me know if I can assist you in the future.   Screening recommendations/referrals: Colonoscopy: no longer required Recommended yearly ophthalmology/optometry visit for glaucoma screening and checkup Recommended yearly dental visit for hygiene and checkup  Vaccinations: Influenza vaccine: Done 02/25/21 Pneumococcal vaccine: Up to date Tdap vaccine: Done 09/14/17 Shingles vaccine: Shingrix discussed. Please contact your pharmacy for coverage information.    Covid-19: Completed 1/22, 2/12, 02/02/20 & 02/15/21  Advanced directives: Please bring a copy of your health care power of attorney and living will to the office at your convenience.   Conditions/risks identified: Get A1C 6.0  Next appointment: Follow up in one year for your annual wellness visit.   Preventive Care 57 Years and Older, Male Preventive care refers to lifestyle choices and visits with your health care provider that can promote health and wellness. What does preventive care include? A yearly physical exam. This is also called an annual well check. Dental exams once or twice a year. Routine eye exams. Ask your health care provider how often you should have your eyes checked. Personal lifestyle choices, including: Daily care of your teeth and gums. Regular physical activity. Eating a healthy diet. Avoiding tobacco and drug use. Limiting alcohol use. Practicing safe sex. Taking low doses of aspirin every day. Taking vitamin and mineral supplements as recommended by your health care provider. What happens during an annual well check? The services and screenings done by your health care provider during your annual well check will depend on your age, overall health, lifestyle risk factors, and family history of disease. Counseling   Your health care provider may ask you questions about your: Alcohol use. Tobacco use. Drug use. Emotional well-being. Home and relationship well-being. Sexual activity. Eating habits. History of falls. Memory and ability to understand (cognition). Work and work Statistician. Screening  You may have the following tests or measurements: Height, weight, and BMI. Blood pressure. Lipid and cholesterol levels. These may be checked every 5 years, or more frequently if you are over 51 years old. Skin check. Lung cancer screening. You may have this screening every year starting at age 23 if you have a 30-pack-year history of smoking and currently smoke or have quit within the past 15 years. Fecal occult blood test (FOBT) of the stool. You may have this test every year starting at age 53. Flexible sigmoidoscopy or colonoscopy. You may have a sigmoidoscopy every 5 years or a colonoscopy every 10 years starting at age 64. Prostate cancer screening. Recommendations will vary depending on your family history and other risks. Hepatitis C blood test. Hepatitis B blood test. Sexually transmitted disease (STD) testing. Diabetes screening. This is done by checking your blood sugar (glucose) after you have not eaten for a while (fasting). You may have this done every 1-3 years. Abdominal aortic aneurysm (AAA) screening. You may need this if you are a current or former smoker. Osteoporosis. You may be screened starting at age 1 if you are at high risk. Talk with your health care provider about your test results, treatment options, and if necessary, the need for more tests. Vaccines  Your health care provider may recommend certain vaccines, such as: Influenza vaccine. This is recommended every year. Tetanus, diphtheria, and acellular pertussis (Tdap, Td) vaccine. You may need a Td booster every 10 years. Zoster vaccine.  You may need this after age 78. Pneumococcal 13-valent conjugate (PCV13) vaccine.  One dose is recommended after age 31. Pneumococcal polysaccharide (PPSV23) vaccine. One dose is recommended after age 59. Talk to your health care provider about which screenings and vaccines you need and how often you need them. This information is not intended to replace advice given to you by your health care provider. Make sure you discuss any questions you have with your health care provider. Document Released: 05/15/2015 Document Revised: 01/06/2016 Document Reviewed: 02/17/2015 Elsevier Interactive Patient Education  2017 [place] Prevention in the Home Falls can cause injuries. They can happen to people of all ages. There are many things you can do to make your home safe and to help prevent falls. What can I do on the outside of my home? Regularly fix the edges of walkways and driveways and fix any cracks. Remove anything that might make you trip as you walk through a door, such as a raised step or threshold. Trim any bushes or trees on the path to your home. Use bright outdoor lighting. Clear any walking paths of anything that might make someone trip, such as rocks or tools. Regularly check to see if handrails are loose or broken. Make sure that both sides of any steps have handrails. Any raised decks and porches should have guardrails on the edges. Have any leaves, snow, or ice cleared regularly. Use sand or salt on walking paths during winter. Clean up any spills in your garage right away. This includes oil or grease spills. What can I do in the bathroom? Use night lights. Install grab bars by the toilet and in the tub and shower. Do not use towel bars as grab bars. Use non-skid mats or decals in the tub or shower. If you need to sit down in the shower, use a plastic, non-slip stool. Keep the floor dry. Clean up any water that spills on the floor as soon as it happens. Remove soap buildup in the tub or shower regularly. Attach bath mats securely with double-sided  non-slip rug tape. Do not have throw rugs and other things on the floor that can make you trip. What can I do in the bedroom? Use night lights. Make sure that you have a light by your bed that is easy to reach. Do not use any sheets or blankets that are too big for your bed. They should not hang down onto the floor. Have a firm chair that has side arms. You can use this for support while you get dressed. Do not have throw rugs and other things on the floor that can make you trip. What can I do in the kitchen? Clean up any spills right away. Avoid walking on wet floors. Keep items that you use a lot in easy-to-reach places. If you need to reach something above you, use a strong step stool that has a grab bar. Keep electrical cords out of the way. Do not use floor polish or wax that makes floors slippery. If you must use wax, use non-skid floor wax. Do not have throw rugs and other things on the floor that can make you trip. What can I do with my stairs? Do not leave any items on the stairs. Make sure that there are handrails on both sides of the stairs and use them. Fix handrails that are broken or loose. Make sure that handrails are as long as the stairways. Check any carpeting to make sure that it is  firmly attached to the stairs. Fix any carpet that is loose or worn. Avoid having throw rugs at the top or bottom of the stairs. If you do have throw rugs, attach them to the floor with carpet tape. Make sure that you have a light switch at the top of the stairs and the bottom of the stairs. If you do not have them, ask someone to add them for you. What else can I do to help prevent falls? Wear shoes that: Do not have high heels. Have rubber bottoms. Are comfortable and fit you well. Are closed at the toe. Do not wear sandals. If you use a stepladder: Make sure that it is fully opened. Do not climb a closed stepladder. Make sure that both sides of the stepladder are locked into place. Ask  someone to hold it for you, if possible. Clearly mark and make sure that you can see: Any grab bars or handrails. First and last steps. Where the edge of each step is. Use tools that help you move around (mobility aids) if they are needed. These include: Canes. Walkers. Scooters. Crutches. Turn on the lights when you go into a dark area. Replace any light bulbs as soon as they burn out. Set up your furniture so you have a clear path. Avoid moving your furniture around. If any of your floors are uneven, fix them. If there are any pets around you, be aware of where they are. Review your medicines with your doctor. Some medicines can make you feel dizzy. This can increase your chance of falling. Ask your doctor what other things that you can do to help prevent falls. This information is not intended to replace advice given to you by your health care provider. Make sure you discuss any questions you have with your health care provider. Document Released: 02/12/2009 Document Revised: 09/24/2015 Document Reviewed: 05/23/2014 Elsevier Interactive Patient Education  2017 [employer].

## 2021-05-11 ENCOUNTER — Ambulatory Visit: Payer: Medicare Other

## 2021-05-31 NOTE — Progress Notes (Signed)
? ?Phone [phone] ?In person visit ?  ?Subjective:  ? ?Walter [name] is a 81 y.o. year old very pleasant male patient who presents for/with See problem oriented charting ?Chief Complaint  ?Patient presents with  ? Follow-up  ? Hypertension  ? Diabetes  ? Dental Pain  ?  Wants antibiotic for this.  ? ? ?This visit occurred during the SARS-CoV-2 public health emergency.  Safety protocols were in place, including screening questions prior to the visit, additional usage of staff PPE, and extensive cleaning of exam room while observing appropriate contact time as indicated for disinfecting solutions.  ? ?Past Medical History-  ?Patient Active Problem List  ? Diagnosis Date Noted  ? 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  Priority: High  ? 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  ?  Priority: High  ? BPH (benign prostatic hyperplasia) 07/02/2015  ?  Priority: High  ? Diabetes mellitus type II, controlled 04/23/2007  ?  Priority: High  ? History of colon cancer 01/27/2014  ?  Priority: Medium   ? Hyperlipidemia associated with type 2 diabetes mellitus 04/23/2007  ?  Priority: Medium   ? Obstructive sleep apnea 04/23/2007  ?  Priority: Medium   ? Hypertension associated with diabetes 04/23/2007  ?  Priority: Medium   ? 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  Priority: Low  ? Hallux valgus of right foot 03/26/2014  ?  Priority: Low  ? Iritis 12/17/2008  ?  Priority: Low  ? Postherpetic neuralgia 05/22/2008  ?  Priority: Low  ? 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  Priority: Low  ? Bigeminy 04/21/2008  ?  Priority: Low  ? Allergic rhinitis 08/19/2019  ? 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  ? ? ?Medications- reviewed and updated ?Current Outpatient Medications  ?Medication Sig Dispense Refill  ? amLODipine (NORVASC) 5 MG tablet TAKE 1 TABLET BY MOUTH EVERY DAY 90 tablet 3  ? donepezil (ARICEPT) 10 MG tablet Take 1 tablet (10 mg total) by mouth at bedtime. 90 tablet 3  ? fluticasone (FLONASE)  50 MCG/ACT nasal spray SPRAY 2 SPRAYS INTO EACH NOSTRIL EVERY DAY 48 mL 2  ? glipiZIDE (GLUCOTROL) 10 MG tablet TAKE 1 TABLET (10 MG TOTAL) BY MOUTH 2 (TWO) TIMES DAILY BEFORE A MEAL. 180 tablet 3  ? JANUVIA 100 MG tablet TAKE 1 TABLET BY MOUTH EVERY DAY 90 tablet 1  ? metFORMIN (GLUCOPHAGE) 1000 MG tablet TAKE 1 TABLET BY MOUTH 2 TIMES DAILY WITH A MEAL. 180 tablet 3  ? Multiple Vitamins-Minerals (CENTRUM SILVER PO) Take 1 tablet by mouth daily.     ? ONETOUCH ULTRA test strip USE ONCE DAILY AS INSTRUCTED 100 strip 3  ? rosuvastatin (CRESTOR) 20 MG tablet TAKE 1 TABLET BY MOUTH ONCE A WEEK 13 tablet 3  ? telmisartan (MICARDIS) 40 MG tablet TAKE 1 TABLET BY MOUTH EVERY DAY 90 tablet 3  ? ?No current facility-administered medications for this visit.  ? ?  ?Objective:  ?BP 138/70   Pulse 75   Temp 97.6 ?F (36.4 ?C)   Ht 6' (1.829 m)   Wt 194 lb 6.4 oz (88.2 kg)   SpO2 95%   BMI 26.37 kg/m?  ?Gen: NAD, resting comfortably ?CV: RRR no murmurs rubs or gallops ?Lungs: CTAB no crackles, wheeze, rhonchi ?Ext: no edema ?Skin: warm, dry ? ?  ? ?Assessment and Plan  ? ?# Abcess in mouth - last visit ?S: from last note "bit a chicken bone and went down harshly  next to second molar. Seen dentist and overall improved but irritated still- suggested considering antibiotic with PCP when he seen him 2 weeks ago- area had continued to heal further an. No purulence and pain improving- we opted to hold off on antibiotics unless worsened symptoms.  "  ?  ?Dentist thought it was not related to the teeth- he thinks there could be some lingered irritation from prior chicken bone and he recommended antibiotic from PCP ?  ?At last visit appear to have a small abscess at the base of the back left molars-we trialed Augmentin for 10 days and suggested oral surgeon consult if not improving- he states improved within 2 days ? ?He reports today has an old filling that is getting irritated- sees dentist next week. Did better last time with  augmentin.  ?A/P: he would like to have augmentin on hand in case recurrence- I really think that getting old filling removed/tooth removed is key but unfortunately costs $1k and not feasible right now for him ? ?#Mild cognitive impairment-follows with Dr. Delice Lesch ?S: Medication: Donepezil 10 mg ?A/P: overall stable- follow up with Dr. Delice Lesch- continue current meds  ?  ?# Diabetes-poorly controlled at times  ?S: Medication:glipizide 10 mg twice daily, januvia 100mg  daily, metformin  1000mg  twice daily  ?CBGs- 128 this am. Usually 110s  or 120sin AM. Averaging 125 in mornings ?Exercise and diet- restarting exercise but only twice a week with wife's health issues, has tried to reduce portion sizes  ?Lab Results  ?Component Value Date  ? HGBA1C 8.3 (H) 03/02/2021  ?A/P: hopefully improved- update a1c- for now continue current meds ?-Prefers to avoid injections-but had considered  Rybelsus versus Ozempic- prefers rybelsus- would stop [place] ?  ?#hypertension ?S: medication: Telmisartan 40 mg every day, amlodipine 5 mg every day ?Home readings #s: 782N systolic at home over 56O ?BP Readings from Last 3 Encounters:  ?07/02/21 138/70  ?04/09/21 138/72  ?03/05/21 138/79  ?A/P: reasonable control with coffee this AM- continue current meds ? ?#hyperlipidemia ?S: Medication: Rosuvastatin 40 mg once a week  ?Lab Results  ?Component Value Date  ? CHOL 139 03/02/2021  ? HDL 42.60 03/02/2021  ? [place] 68 03/02/2021  ? LDLDIRECT 90.0 09/14/2017  ? TRIG 141.0 03/02/2021  ? CHOLHDL 3 03/02/2021  ?A/P: perfect control last check- update lipids yearly ?  ?Recommended follow up: Return in about 4 months (around 11/01/2021) for follow up- or sooner if needed. ?Future Appointments  ?Date Time Provider [place]  ?09/02/2021 11:00 AM Shawn Route, Coralee Pesa, PA-C [facility] None  ?03/14/2022 10:00 AM Cameron Sprang, MD [facility] None  ?05/12/2022 10:15 AM [facility] HEALTH COACH [facility]  ? ?Lab/Order associations: ?  ICD-10-CM   ?1.  Controlled type 2 diabetes mellitus without complication, without long-term current use of insulin (HCC)  E11.9   ?  ?2. Hyperlipidemia associated with type 2 diabetes mellitus  E11.69   ? E78.5   ?  ?3. Primary hypertension  I10   ?  ? ?No orders of the defined types were placed in this encounter. ? ?I,Jada Bradford,acting as a scribe for [name], MD.,have documented all relevant documentation on the behalf of [name], MD,as directed by  [name], MD while in the presence of [name], MD. ? ?I, [name], MD, have reviewed all documentation for this visit. The documentation on 07/02/21 for the exam, diagnosis, procedures, and orders are all accurate and complete. ? ?Return precautions advised.  ?[name], MD ? ? ?

## 2021-06-22 ENCOUNTER — Other Ambulatory Visit: Payer: Self-pay | Admitting: Family Medicine

## 2021-07-02 ENCOUNTER — Ambulatory Visit: Payer: Medicare Other | Admitting: Family Medicine

## 2021-07-02 ENCOUNTER — Encounter: Payer: Self-pay | Admitting: Family Medicine

## 2021-07-02 ENCOUNTER — Other Ambulatory Visit: Payer: Self-pay

## 2021-07-02 VITALS — BP 138/70 | HR 75 | Temp 97.6°F | Ht 72.0 in | Wt 194.4 lb

## 2021-07-02 DIAGNOSIS — E785 Hyperlipidemia, unspecified: Secondary | ICD-10-CM | POA: Diagnosis not present

## 2021-07-02 DIAGNOSIS — I10 Essential (primary) hypertension: Secondary | ICD-10-CM | POA: Diagnosis not present

## 2021-07-02 DIAGNOSIS — E1169 Type 2 diabetes mellitus with other specified complication: Secondary | ICD-10-CM | POA: Diagnosis not present

## 2021-07-02 DIAGNOSIS — E119 Type 2 diabetes mellitus without complications: Secondary | ICD-10-CM

## 2021-07-02 LAB — COMPREHENSIVE METABOLIC PANEL
ALT: 25 U/L (ref 0–53)
AST: 17 U/L (ref 0–37)
Albumin: 4.4 g/dL (ref 3.5–5.2)
Alkaline Phosphatase: 62 U/L (ref 39–117)
BUN: 16 mg/dL (ref 6–23)
CO2: 29 mEq/L (ref 19–32)
Calcium: 9.6 mg/dL (ref 8.4–10.5)
Chloride: 101 mEq/L (ref 96–112)
Creatinine, Ser: 0.98 mg/dL (ref 0.40–1.50)
GFR: 72.77 mL/min (ref >60.00)
Glucose, Bld: 169 mg/dL — ABNORMAL HIGH (ref 70–99)
Potassium: 4.2 mEq/L (ref 3.5–5.1)
Sodium: 138 mEq/L (ref 135–145)
Total Bilirubin: 0.7 mg/dL (ref 0.2–1.2)
Total Protein: 6.9 g/dL (ref 6.0–8.3)

## 2021-07-02 LAB — HEMOGLOBIN A1C: Hgb A1c MFr Bld: 7.4 % — ABNORMAL HIGH (ref 4.6–6.5)

## 2021-07-02 MED ORDER — AMOXICILLIN-POT CLAVULANATE 875-125 MG PO TABS: 1.0000 | ORAL_TABLET | Freq: Two times a day (BID) | ORAL | 0 refills | Status: DC

## 2021-07-02 NOTE — Patient Instructions (Addendum)
Please stop by lab before you go ?If you have mychart- we will send your results within 3 business days of [place] receiving them.  ?If you do not have mychart- we will call you about results within 5 business days of [place] receiving them.  ?*please also note that you will see labs on mychart as soon as they post. I will later go in and write notes on them- will say "notes from Dr. Yong Channel"  ? ?Recommended follow up: Return in about 4 months (around 11/01/2021) for follow up- or sooner if needed. ?

## 2021-07-31 ENCOUNTER — Other Ambulatory Visit: Payer: Self-pay | Admitting: Family Medicine

## 2021-09-02 ENCOUNTER — Encounter: Payer: Self-pay | Admitting: Physician Assistant

## 2021-09-02 ENCOUNTER — Ambulatory Visit: Payer: Medicare Other | Admitting: Physician Assistant

## 2021-09-02 VITALS — BP 134/82 | HR 82 | Resp 18 | Ht 72.0 in | Wt 196.0 lb

## 2021-09-02 DIAGNOSIS — G3184 Mild cognitive impairment, so stated: Secondary | ICD-10-CM

## 2021-09-02 NOTE — Patient Instructions (Signed)
Good to see you. ? ?Continue Donepezil to '10mg'$  daily. 1 tablet daily ? ?3. Follow-up in 6 months, call for any changes ? ? ?RECOMMENDATIONS FOR ALL PATIENTS WITH MEMORY PROBLEMS: ?1. Continue to exercise (Recommend 30 minutes of walking everyday, or 3 hours every week) ?2. Increase social interactions - continue going to [place] and enjoy social gatherings with friends and family ?3. Eat healthy, avoid fried foods and eat more fruits and vegetables ?4. Maintain adequate blood pressure, blood sugar, and blood cholesterol level. Reducing the risk of stroke and cardiovascular disease also helps promoting better memory. ?5. Avoid stressful situations. Live a simple life and avoid aggravations. Organize your time and prepare for the next day in anticipation. ?6. Sleep well, avoid any interruptions of sleep and avoid any distractions in the bedroom that may interfere with adequate sleep quality ?7. Avoid sugar, avoid sweets as there is a strong link between excessive sugar intake, diabetes, and cognitive impairment ?We discussed the Mediterranean diet, which has been shown to help patients reduce the risk of progressive memory disorders and reduces cardiovascular risk. This includes eating fish, eat fruits and green leafy vegetables, nuts like almonds and hazelnuts, walnuts, and also use olive oil. Avoid fast foods and fried foods as much as possible. Avoid sweets and sugar as sugar use has been linked to worsening of memory function. ? ?There is always a concern of gradual progression of memory problems. If this is the case, then we may need to adjust level of care according to patient needs. Support, both to the patient and caregiver, should then be put into place. ? ?    Mediterranean Diet ? ?Why follow it? Research shows? ?Those who follow the Mediterranean diet have a reduced risk of heart disease  ?The diet is associated with a reduced incidence of Parkinson's and Alzheimer's diseases ?People following the diet may  have longer life expectancies and lower rates of chronic diseases  ?The Dietary Guidelines for Americans recommends the Mediterranean diet as an eating plan to promote health and prevent disease ? ?What Is the Mediterranean Diet?  ?Healthy eating plan based on typical foods and recipes of Mediterranean-style cooking ?The diet is primarily a plant based diet; these foods should make up a majority of meals  ? ?Starches - Plant based foods should make up a majority of meals ?- They are an important sources of vitamins, minerals, energy, antioxidants, and fiber ?- Choose whole grains, foods high in fiber and minimally processed items  ?- Typical grain sources include wheat, oats, barley, corn, brown rice, bulgar, farro, millet, polenta, couscous  ?- Various types of beans include chickpeas, lentils, fava beans, black beans, white beans   ?Fruits  Veggies - Large quantities of antioxidant rich fruits & veggies; 6 or more servings  ?- Vegetables can be eaten raw or lightly drizzled with oil and cooked  ?- Vegetables common to the traditional Mediterranean Diet include: artichokes, arugula, beets, broccoli, brussel sprouts, cabbage, carrots, celery, collard greens, cucumbers, eggplant, kale, leeks, lemons, lettuce, mushrooms, okra, onions, peas, peppers, potatoes, pumpkin, radishes, rutabaga, shallots, spinach, sweet potatoes, turnips, zucchini ?- Fruits common to the Mediterranean Diet include: apples, apricots, avocados, cherries, clementines, dates, figs, grapefruits, grapes, melons, nectarines, oranges, peaches, pears, pomegranates, strawberries, tangerines  ?Fats - Replace butter and margarine with healthy oils, such as olive oil, canola oil, and tahini  ?- Limit nuts to no more than a handful a day  ?- Nuts include walnuts, almonds, pecans, pistachios, pine nuts  ?- Limit  or avoid candied, honey roasted or heavily salted nuts ?- Olives are central to the Mediterranean diet - can be eaten whole or used in a variety of  dishes   ?Meats Protein - Limiting red meat: no more than a few times a month ?- When eating red meat: choose lean cuts and keep the portion to the size of deck of cards ?- Eggs: approx. 0 to 4 times a week  ?- Fish and lean poultry: at least 2 a week  ?- Healthy protein sources include, chicken, [place], lean beef, lamb ?- Increase intake of seafood such as tuna, salmon, trout, mackerel, shrimp, scallops ?- Avoid or limit high fat processed meats such as sausage and bacon  ?Dairy - Include moderate amounts of low fat dairy products  ?- Focus on healthy dairy such as fat free yogurt, skim milk, low or reduced fat cheese ?- Limit dairy products higher in fat such as whole or 2% milk, cheese, ice cream  ?Alcohol - Moderate amounts of red wine is ok  ?- No more than 5 oz daily for women (all ages) and men older than age 33  ?- No more than 10 oz of wine daily for men younger than 66  ?Other - Limit sweets and other desserts  ?- Use herbs and spices instead of salt to flavor foods  ?- Herbs and spices common to the traditional Mediterranean Diet include: basil, bay leaves, chives, cloves, cumin, fennel, garlic, lavender, marjoram, mint, oregano, parsley, pepper, rosemary, sage, savory, sumac, tarragon, thyme  ? ?It?s not just a diet, it?s a lifestyle:  ?The Mediterranean diet includes lifestyle factors typical of those in the region  ?Foods, drinks and meals are best eaten with others and savored ?Daily physical activity is important for overall good health ?This could be strenuous exercise like running and aerobics ?This could also be more leisurely activities such as walking, housework, yard-work, or taking the stairs ?Moderation is the key; a balanced and healthy diet accommodates most foods and drinks ?Consider portion sizes and frequency of consumption of certain foods  ? ?Meal Ideas & Options:  ?Breakfast:  ?Whole wheat toast or whole wheat English muffins with peanut butter & hard boiled egg ?Steel cut oats topped  with apples & cinnamon and skim milk  ?Fresh fruit: banana, strawberries, melon, berries, peaches  ?Smoothies: strawberries, bananas, greek yogurt, peanut butter ?Low fat greek yogurt with blueberries and granola  ?Egg white omelet with spinach and mushrooms ?Breakfast couscous: whole wheat couscous, apricots, skim milk, cranberries  ?Sandwiches:  ?Hummus and grilled vegetables (peppers, zucchini, squash) on whole wheat bread   ?Grilled chicken on whole wheat pita with lettuce, tomatoes, cucumbers or tzatziki  ?Tuna salad on whole wheat bread: tuna salad made with greek yogurt, olives, red peppers, capers, green onions ?Garlic rosemary lamb pita: lamb saut?ed with garlic, rosemary, salt & pepper; add lettuce, cucumber, greek yogurt to pita - flavor with lemon juice and black pepper  ?Seafood:  ?Mediterranean grilled salmon, seasoned with garlic, basil, parsley, lemon juice and black pepper ?Shrimp, lemon, and spinach whole-grain pasta salad made with low fat greek yogurt  ?Seared scallops with lemon orzo  ?Seared tuna steaks seasoned salt, pepper, coriander topped with tomato mixture of olives, tomatoes, olive oil, minced garlic, parsley, green onions and cappers  ?Meats:  ?Herbed greek chicken salad with kalamata olives, cucumber, feta  ?Red bell peppers stuffed with spinach, bulgur, lean ground beef (or lentils) & topped with feta   ?Kebabs: skewers of chicken,  tomatoes, onions, zucchini, squash  ?[place] burgers: made with red onions, mint, dill, lemon juice, feta cheese topped with roasted red peppers ?Vegetarian ?Cucumber salad: cucumbers, artichoke hearts, celery, red onion, feta cheese, tossed in olive oil & lemon juice  ?Hummus and whole grain pita points with a greek salad (lettuce, tomato, feta, olives, cucumbers, red onion) ?Lentil soup with celery, carrots made with vegetable broth, garlic, salt and pepper  ?Tabouli salad: parsley, bulgur, mint, scallions, cucumbers, tomato, radishes, lemon juice, olive  oil, salt and pepper. ? ?

## 2021-09-02 NOTE — Progress Notes (Signed)
? ?Assessment/Plan:  ? ?Mild Cognitive Impairment likely due to Alzheimer's Disease  ? ?with a history of hypertension, hyperlipidemia, diabetes, sleep apnea on CPAP, colon cancer s/p resection, with memory loss. Repeat Neuropsychological evaluation in 11/2020 again showed Mild Cognitive Impairment, etiology still concerning for underlying Alzheimer's disease with vascular contribution. He is on donepezil 10 mg daily tolerating well. His MMSE today is 27, with delayed recall 0/3. Marland Kitchen  ? ? Recommendations:  ? ?Discussed safety both in and out of the home.  ?Discussed the importance of regular daily schedule with inclusion of crossword puzzles to maintain brain function.  ?Continue to monitor mood by PCP ?Stay active at least 30 minutes at least 3 times a week.  ?Naps should be scheduled and should be no longer than 60 minutes and should not occur after 2 PM.  ?Mediterranean diet is recommended  ?Control cardiovascular risk factors  ?Continue donepezil 10 mg daily Side effects were discussed ?Repeat neurocognitive testing 12/2022 as recommended by Neuropsychology to evaluate trajectory and clarity of the diagnosis ?Follow up in  6 months. Patient has an appt with Doctor Delice Lesch, will keep that appt.  ? ? ?Case discussed with Dr. Delice Lesch who agrees with the plan ? ? ? ? ?Subjective:  ? ? ?Walter [name] is a very pleasant 81 y.o. RH male  seen today in follow up for memory loss. This patient is here alone. Previous records as well as any outside records available were reviewed prior to todays visit.  Patient was last seen at our office on 03/05/21. Patient is currently on Donepezil 10 mg daily, tolerating well ? ? ?Any changes in memory since last visit? Denies. He feels that after increasing the dose, his memory is stable. He reports that he can complete 120 pages a week of crossword puzzles.  He also enjoys reading.  He does not watch much TV. ?Patient lives with: Spouse.  She is currently seeking he has been taking care of  her, which has affected his mood "a little", although he declines any mood medication at this time.  "I will be better ". ?repeats oneself?  Denies ?Disoriented when walking into a room?  Patient denies   ?Leaving objects in unusual places?  Patient denies   ?Ambulates  with difficulty?  Endorsed.  He had some foot surgery as well as right second toe amputation that requires "special shoes that I need to make, which are at times uncomfortable.   ?Recent falls?  Patient denies.  He remains quite active, playing tennis, golf, and skeet shooter ?Any head injuries?  Patient denies   ?History of seizures?   Patient denies   ?Wandering behavior?  Patient denies   ?Patient drives?  Endorsed,  Patient uses GPS to drive only unfamiliar places.  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 ?Any mood changes such irritability agitation? Endorsed.  Patient has been feeling "down" as mentioned above, he declines seeking any cognitive behavioral therapist or mood enhancing medicines at this time.  He feels that menses wife's health improves he will be feeling better. ?Hallucinations?  Patient denies   ?Paranoia?  Patient denies   ?Patient reports that he sleeps well without vivid dreams, REM behavior or sleepwalking    ?History of sleep apnea?  Endorsed, uses CPAP ?Any hygiene concerns?  Patient denies   ?Independent of bathing and dressing?  Endorsed  ?Does the patient needs help with medications? Patient denies   ?Who is in charge of the finances?  Patient is  in charge, denies missing any bills ?Any changes in appetite?  Patient denies   ?Patient have trouble swallowing? Patient denies   ?Does the patient cook?  Patient denies   ?Any kitchen accidents such as leaving the stove on? Patient denies   ?Any headaches?  Patient denies   ?The double vision? Patient denies   ?Any focal numbness or tingling?  Patient denies   ?Chronic back pain Patient denies   ?Unilateral weakness?  Patient denies   ?Any tremors?  Patient denies    ?Any history of anosmia?  Patient denies   ?Any incontinence of urine?  Patient denies   ?Any bowel dysfunction?   Patient denies     ? ?History on Initial Assessment 11/25/2019: This is a 81 year old right-handed man with a history of hypertension, hyperlipidemia, diabetes, sleep apnea on CPAP, colon cancer s/p resection, presenting for evaluation of memory loss/word-finding difficulties. He reported symptoms started 2 years ago, MMSE 25/30 in PCP office last 08/2019. He and his wife started noticing more significant changes in the past 1 and 1/2 years. He endorses word-finding difficulties, he tries to tell something and the important part slips by him. He states it is not constant, however his wife says it happens a lot. He is searching for words and thoughts, unable to remember names of things or restaurants. He is forgetting how to do things sometimes, this is not often, but one time he forgot how to put his car in park (had to push a button). He has had this car for 2 years. He repeats stories. He cannot focus when watching TV and she is speaking to him, unable to multitask. He says "huh" or "what did you say" a lot, and she does not think it is his hearing. He lives with his wife. He denies getting lost driving, no missed ills or medications. He denies misplacing things. His wife has also noticed personality changes. He has a short temper worse than before and gets very nasty. When he cannot think of a word, it makes him nervous and uptight. He gets angry and upset when criticized or when he does not agree. No paranoia or hallucinations, no prior psychiatric history. He states his mood is good. His mother had dementia. No history of significant head injuries. No alcohol use. ?  ?He had constipation which resolved with a stool softener. He denies any headaches, dizziness, diplopia, dysarthria/dysphagia, neck/back pain, focal numbness/tingling/weakness, bladder dysfunction, anosmia, or tremors. He sleeps like a  baby with his CPAP machine. 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  ? ?Neurocognitive testing 11/2020 Dr. Melvyn Novas Briefly, results suggested noted performance variability surrounding executive functioning, semantic fluency, confrontation naming, and encoding (i.e., encoding) and retrieval aspects of memory. Performance was generally appropriate relative to age-matched peers across processing speed, receptive language, phonemic fluency, and visuospatial abilities. Relative to his evaluation one year prior, his most notable areas of decline surrounded executive functioning and attention/concentration. These were generally mild overall. Regarding etiology, there remain some concerns surrounding Alzheimer's disease based upon memory dysfunction, coupled with deficits in confrontation naming, semantic fluency, and executive functioning. As such, I cannot rule out this diagnosis. However, despite scores suggesting delayed memory falling in the exceptionally low normative range, he was able to demonstrate some appropriate retrieval/consolidation abilities, which would not suggest characteristic rapid forgetting. If Alzheimer's disease is indeed the underlying pathology, it appears to be progressing slowly, likely aided by current medication intervention and regular physical/mental stimulation.  ?  ? ?  Past Medical History:  ?Diagnosis Date  ? 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 Allergic rhinitis 08/19/2019  ? flonase  ? BPH (benign prostatic hyperplasia) 07/02/2015  ? S/p TURP with multiple complications afterwards including recurrent hospitalizations. All started after a hemorrhoid surgery and later with urinary retention. Patient at one point was septic from urinary issues.    ? Diabetes mellitus type II, controlled 04/23/2007  ?  Metformin 1g BID, amaryl '4mg'$ --> '8mg'$ , had to add [place] back  Never took victoza.    Januvia-lethargic and dizzy in past. Retrial ok; could change to victoza if needed Serious UTI history- likely avoid sglt2 inhibtor  ? Hallux valgus of right foot 03/26/2014  ? 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

## 2021-11-04 ENCOUNTER — Ambulatory Visit: Payer: Medicare Other | Admitting: Family Medicine

## 2021-11-05 ENCOUNTER — Ambulatory Visit: Payer: Medicare Other | Admitting: Family Medicine

## 2021-11-05 ENCOUNTER — Encounter: Payer: Self-pay | Admitting: Family Medicine

## 2021-11-05 VITALS — BP 130/70 | HR 71 | Temp 98.2°F | Ht 72.0 in | Wt 196.0 lb

## 2021-11-05 DIAGNOSIS — E119 Type 2 diabetes mellitus without complications: Secondary | ICD-10-CM | POA: Diagnosis not present

## 2021-11-05 DIAGNOSIS — Z89421 Acquired absence of other right toe(s): Secondary | ICD-10-CM

## 2021-11-05 DIAGNOSIS — E785 Hyperlipidemia, unspecified: Secondary | ICD-10-CM

## 2021-11-05 DIAGNOSIS — I10 Essential (primary) hypertension: Secondary | ICD-10-CM

## 2021-11-05 DIAGNOSIS — G3184 Mild cognitive impairment, so stated: Secondary | ICD-10-CM | POA: Diagnosis not present

## 2021-11-05 DIAGNOSIS — E1169 Type 2 diabetes mellitus with other specified complication: Secondary | ICD-10-CM

## 2021-11-05 LAB — POCT GLYCOSYLATED HEMOGLOBIN (HGB A1C): Hemoglobin A1C: 7.2 % — AB (ref 4.0–5.6)

## 2021-11-05 NOTE — Patient Instructions (Addendum)
Please check with your pharmacy to see if they have the shingrix vaccine. If they do- please get this immunization and update [place] by phone call or mychart with dates you receive the vaccine  Congrats on a1c 7.2  If any possible lows please check sugar- shaky,sweaty, increased hunger, anxious for example  Recommended follow up: Return in about 4 months (around 03/08/2022) for physical or sooner if needed.Schedule b4 you leave.

## 2021-11-05 NOTE — Progress Notes (Signed)
Phone [phone] In person visit   Subjective:   Walter [name] is a 81 y.o. year old very pleasant male patient who presents for/with See problem oriented charting Chief Complaint  Patient presents with   Follow-up   Hypertension   Diabetes    Wants to talk about Januvia substitution.   Past Medical History-  Patient Active Problem List   Diagnosis Date Noted   Mild neurocognitive disorder 12/23/2019    Priority: High   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    Priority: High   BPH (benign prostatic hyperplasia) 07/02/2015    Priority: High   Diabetes mellitus type II, controlled 04/23/2007    Priority: High   History of colon cancer 01/27/2014    Priority: Medium    Hyperlipidemia associated with type 2 diabetes mellitus 04/23/2007    Priority: Medium    Obstructive sleep apnea 04/23/2007    Priority: Medium    Essential hypertension 04/23/2007    Priority: Medium    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    Priority: Low   Hallux valgus of right foot 03/26/2014    Priority: Low   Iritis 12/17/2008    Priority: Low   Postherpetic neuralgia 05/22/2008    Priority: Low   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 90 tablet 3   fluticasone (FLONASE) 50 MCG/ACT nasal spray SPRAY 2 SPRAYS INTO EACH NOSTRIL EVERY DAY 48 mL 2   glipiZIDE (GLUCOTROL) 10 MG tablet TAKE 1 TABLET (10 MG TOTAL) BY MOUTH 2 (TWO) TIMES DAILY BEFORE A MEAL. 180 tablet 3   JANUVIA 100 MG tablet TAKE 1 TABLET BY MOUTH EVERY DAY 90 tablet 1   metFORMIN (GLUCOPHAGE) 1000 MG tablet TAKE 1 TABLET  BY MOUTH 2 TIMES DAILY WITH A MEAL. 180 tablet 3   Multiple Vitamins-Minerals (CENTRUM SILVER PO) Take 1 tablet by mouth daily.      ONETOUCH ULTRA test strip USE ONCE DAILY AS INSTRUCTED 100 strip 3   rosuvastatin (CRESTOR) 20 MG tablet TAKE 1 TABLET BY MOUTH ONE TIME PER WEEK 13 tablet 3   telmisartan (MICARDIS) 40 MG tablet TAKE 1 TABLET BY MOUTH EVERY DAY 90 tablet 3   No current facility-administered medications for this visit.     Objective:  BP 130/70   Pulse 71   Temp 98.2 F (36.8 C)   Ht 6' (1.829 m)   Wt 196 lb (88.9 kg)   SpO2 96%   BMI 26.58 kg/m  Gen: NAD, resting comfortably CV: RRR no murmurs rubs or gallops Lungs: CTAB no crackles, wheeze, rhonchi Ext: no edema Skin: warm, dry    Assessment and Plan   #Mild cognitive impairment-follows with Dr. Delice Lesch S: Medication: Donepezil 10 mg A/P: Overall stable recently-continue monitoring continue current medication   # Diabetes-poorly controlled at times  S: Medication:glipizide 10 mg twice daily, januvia '100mg'$  daily, metformin  '1000mg'$  twice daily  -prefers pills CBGs- mid 120s to mid 140s- highest he has seen was 210 but went down by next day.  Has had feelings of exertion at times of feeling  anxious and hungry Exercise and diet- works hard in the yard Lab Results  Component Value Date   HGBA1C POC 7.2 (A) 11/05/2021   HGBA1C 7.4 (H) 07/02/2021   HGBA1C 8.3 (H) 03/02/2021    A/P: I am going to consider this good contro with point-of-care A1c of 7.2 l-we will target A1c under 8 given his age and use of glipizide to avoid hypoglycemia.  We will continue current medications - If gets above 8 we discussed possibly stopping Januvia and starting Rybelsus as he prefers to avoid injectables - We did discuss checking blood pressure after activity when he feels anxious and hungry to make sure no lows-would prefer to keep in over 80  #hypertension S: medication: Telmisartan 40 mg every day, amlodipine 5 mg every day BP  Readings from Last 3 Encounters:  11/05/21 130/70  09/02/21 134/82  07/02/21 138/70  A/P: Controlled. Continue current medications.   #hyperlipidemia S: Medication: Rosuvastatin 40 mg once a week  Lab Results  Component Value Date   CHOL 139 03/02/2021   HDL 42.60 03/02/2021   LDLCALC 68 03/02/2021   LDLDIRECT 90.0 09/14/2017   TRIG 141.0 03/02/2021   CHOLHDL 3 03/02/2021  A/P: excellent control for diabetes- continue current meds  #Amputation of toe of right foot-status noted-patient is following with podiatry and has some pain associated with the loss of this toe  #Immunizations-encouraged to consider Shingrix  Recommended follow up: Return in about 4 months (around 03/08/2022) for physical or sooner if needed.Schedule b4 you leave. Future Appointments  Date Time Provider [place]  01/05/2022  9:00 AM Laurin Coder, MD [facility] None  01/31/2022  8:15 AM Trula Slade, DPM [facility]  03/07/2022  1:00 PM Rondel Jumbo, PA-C [facility] None  04/22/2022  2:00 PM [name], MD [facility]  05/12/2022 10:15 AM [facility] HEALTH COACH [facility]    Lab/Order associations:   ICD-10-CM   1. Controlled type 2 diabetes mellitus without complication, without long-term current use of insulin (HCC)  E11.9 POCT HgB A1C    2. Acquired absence of other right toe(s) (HCC) Chronic Z89.421     3. Mild neurocognitive disorder  G31.84     4. Essential hypertension  I10     5. Hyperlipidemia associated with type 2 diabetes mellitus  E11.69    E78.5      Skin glue Return precautions advised.  [name], MD

## 2021-11-07 ENCOUNTER — Other Ambulatory Visit: Payer: Self-pay | Admitting: Family Medicine

## 2021-11-09 IMAGING — MR MR HEAD WO/W CM
14 series · 48 of 48 positions shown · IV contrast (multihance)
Comparison: None.

CLINICAL DATA: Memory loss.  History of colon cancer

EXAM:
MRI HEAD WITHOUT AND WITH CONTRAST
TECHNIQUE: Multiplanar, multiecho pulse sequences of the brain and surrounding
structures were obtained without and with intravenous contrast.
CONTRAST:  18mL MULTIHANCE GADOBENATE DIMEGLUMINE 529 MG/ML IV SOLN

[Series 2: T1 · sagittal · 5.0mm · 0.45mm/px · 2 of 22 slices shown]
[im 1/22]
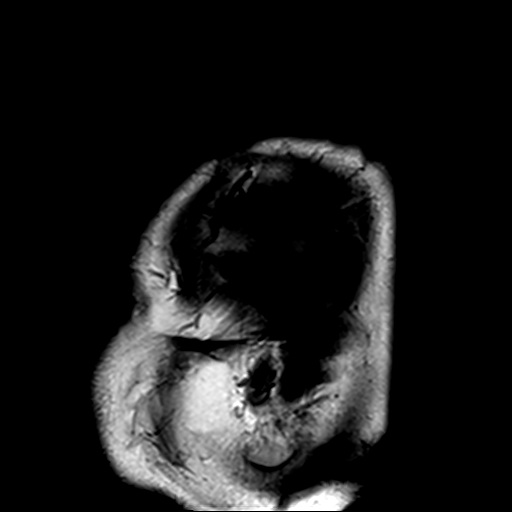
[im 22/22]
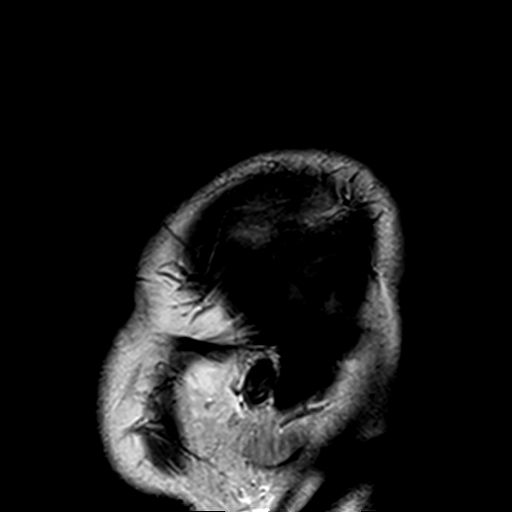

[Series 3: DWI · axial · 3.0mm · 1.80mm/px · z∈[-58,+88]mm · 7 of 99 slices shown (1 of 4)]
[im 1/99]
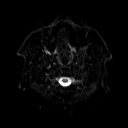
[im 17/99]
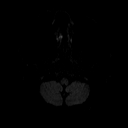
[im 33/99]
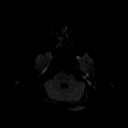
[im 50/99]
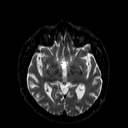
[im 66/99]
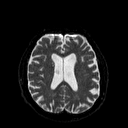
[im 82/99]
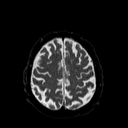
[im 99/99]
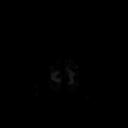

[Series 4: DWI · axial · 3.0mm · 1.80mm/px · z∈[-58,+88]mm · 3 of 50 slices shown (2 of 4)]
[im 1/50]
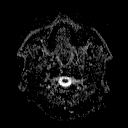
[im 25/50]
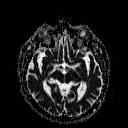
[im 50/50]
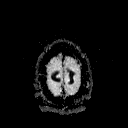

[Series 5: DWI · coronal · 5.0mm · 1.80mm/px · 4 of 68 slices shown (3 of 4)]
[im 1/68]
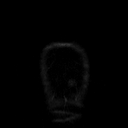
[im 23/68]
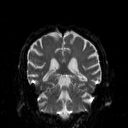
[im 45/68]
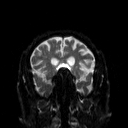
[im 68/68]
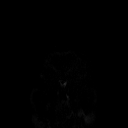

[Series 6: DWI · coronal · 5.0mm · 1.80mm/px · 2 of 34 slices shown (4 of 4)]
[im 1/34]
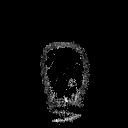
[im 34/34]
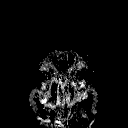

[Series 7: T2 · axial · 5.0mm · 0.60mm/px · 1 of 22 slices shown (1 of 2)]
[im 1/22]
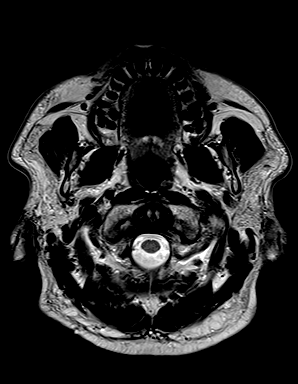

[Series 8: FLAIR · axial · 3.0mm · 0.45mm/px · z∈[-52,+82]mm · 2 of 30 slices shown (1 of 2)]
[im 1/30]
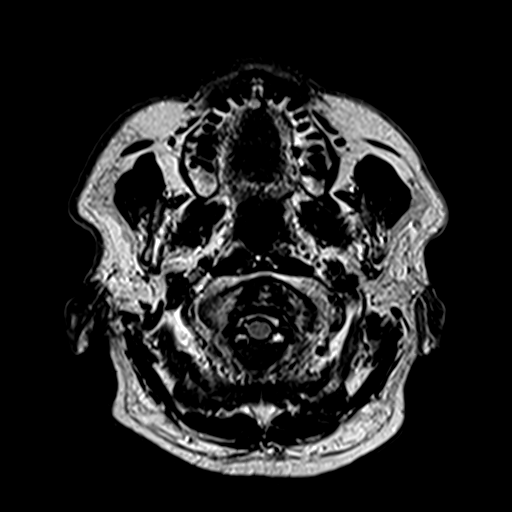
[im 30/30]
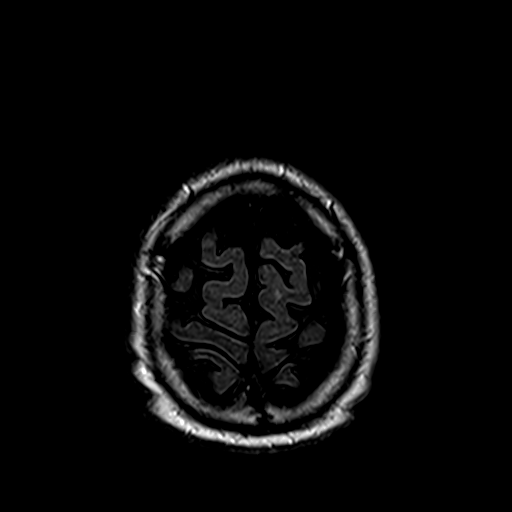

[Series 10: swi_images · axial · 4.0mm · 0.90mm/px · z∈[-54,+85]mm · 2 of 36 slices shown]
[im 1/36]
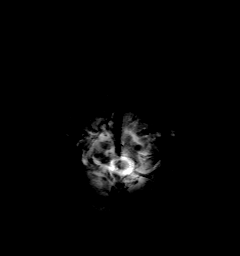
[im 36/36]
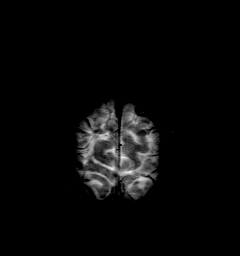

[Series 11: t1_mpr_tra · axial · 1.0mm · 0.75mm/px · z∈[-54,+88]mm · 9 of 144 slices shown (1 of 2)]
[im 1/144]
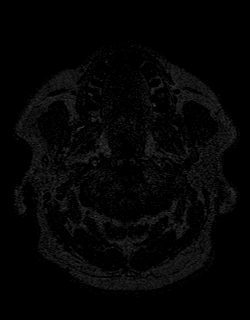
[im 18/144]
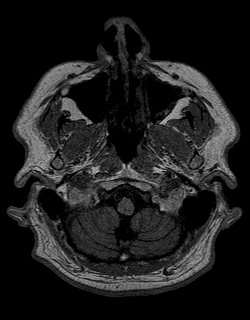
[im 36/144]
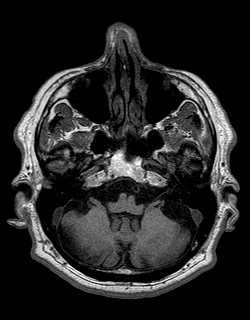
[im 54/144]
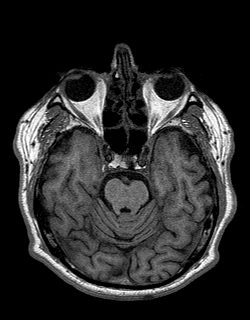
[im 72/144]
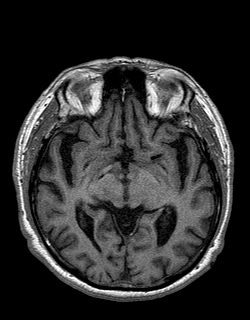
[im 90/144]
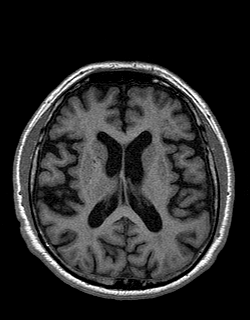
[im 108/144]
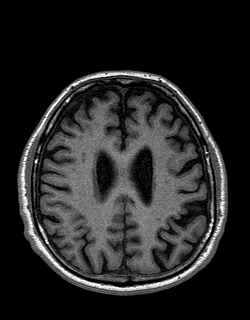
[im 126/144]
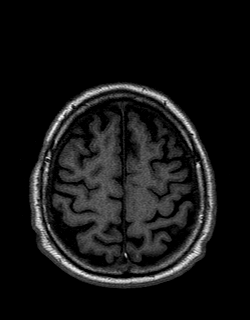
[im 144/144]
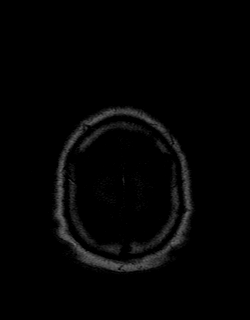

[Series 12: FLAIR · sagittal · 5.0mm · 0.45mm/px · 2 of 25 slices shown (2 of 2)]
[im 1/25]
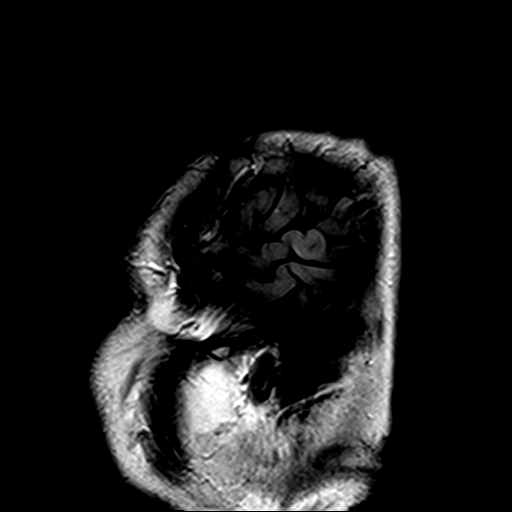
[im 25/25]
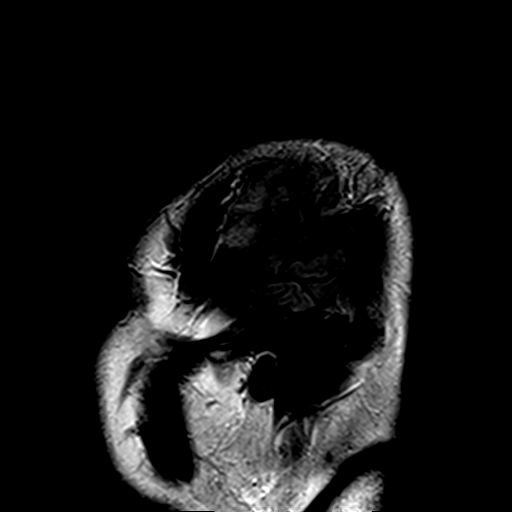

[Series 13: T2 · coronal · 5.0mm · 0.45mm/px · 2 of 25 slices shown (2 of 2)]
[im 1/25]
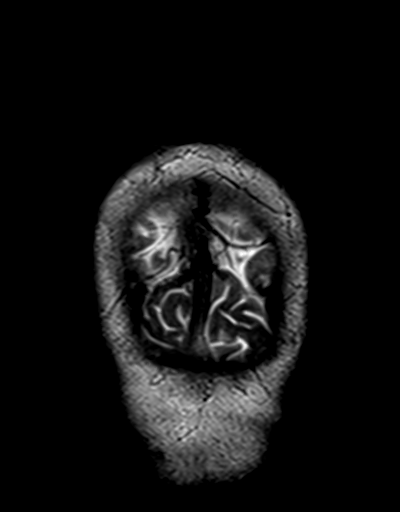
[im 25/25]
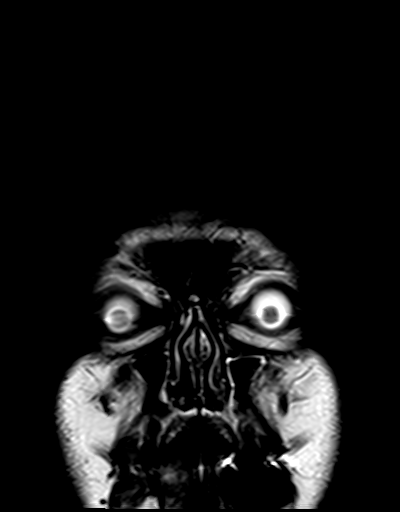

[Series 14: t1_mpr_tra · axial · 1.0mm · 0.75mm/px · z∈[-54,+88]mm · 9 of 144 slices shown (2 of 2)]
[im 1/144]
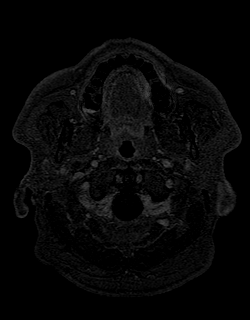
[im 18/144]
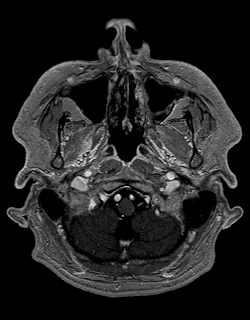
[im 36/144]
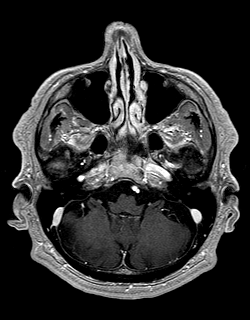
[im 54/144]
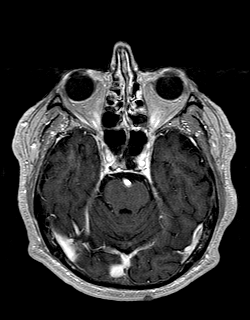
[im 72/144]
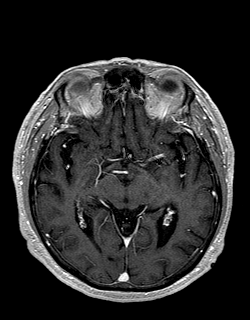
[im 90/144]
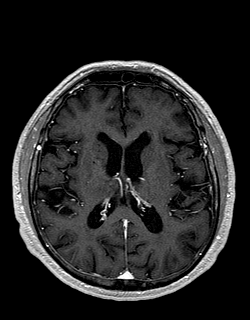
[im 108/144]
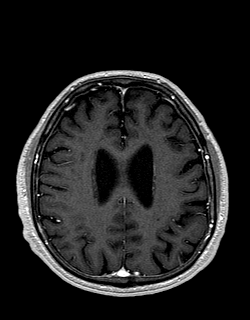
[im 126/144]
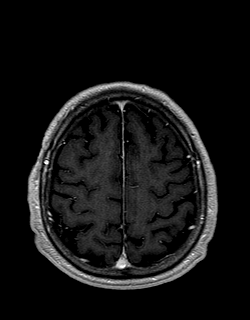
[im 144/144]
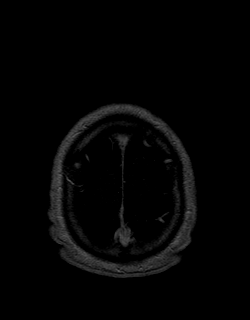

[Series 15: post cor · coronal · 5.0mm · 0.45mm/px · 2 of 25 slices shown]
[im 1/25]
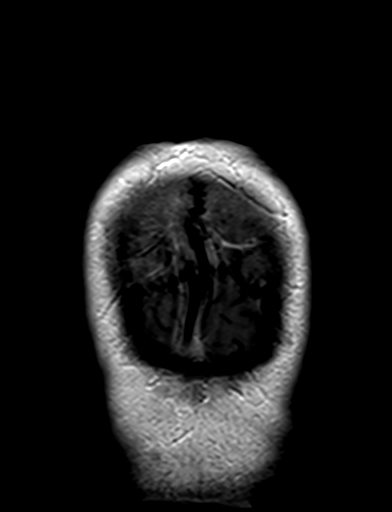
[im 25/25]
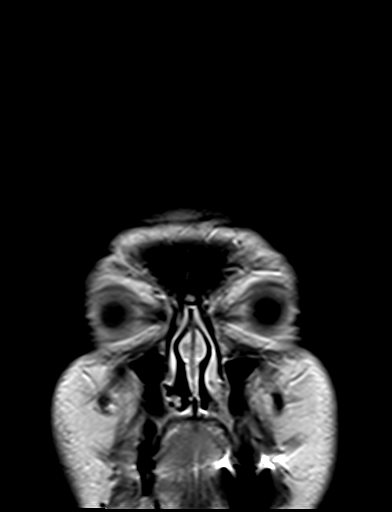

[Series 16: post sag (optional · sagittal · 5.0mm · 0.45mm/px · 1 of 21 slices shown]
[im 1/21]
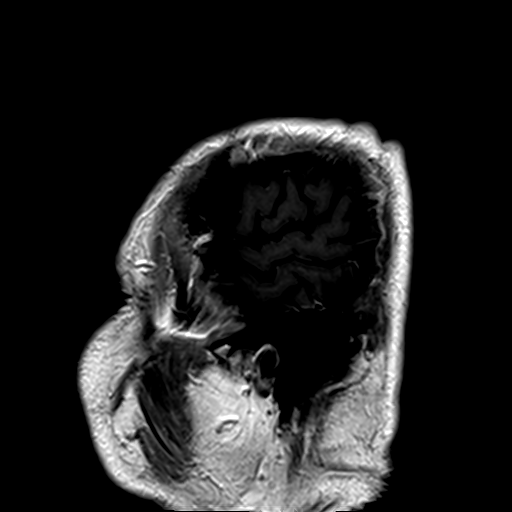

[48 of 48 positions shown; findings below may reference images not displayed]

FINDINGS: Brain: No recent infarction, hemorrhage, hydrocephalus, extra-axial
collection or mass lesion. Chronic small vessel ischemia in the
cerebral white matter, mild for age. Cerebral volume loss without
specific pattern, relatively mild for age. No chronic blood products
or abnormal mineralization. No abnormal enhancement.

Vascular: Preserved flow voids. Tortuous basilar impressing on the
pons.

Skull and upper cervical spine: Normal marrow signal.

Sinuses/Orbits: Mucosal thickening mainly in ethmoid sinuses.
IMPRESSION: 1. No reversible finding or specific explanation for symptoms.
2. Mild for age white matter disease and generalized volume loss.

## 2021-12-27 ENCOUNTER — Other Ambulatory Visit: Payer: Self-pay | Admitting: Family Medicine

## 2021-12-27 ENCOUNTER — Other Ambulatory Visit: Payer: Self-pay | Admitting: Neurology

## 2021-12-28 ENCOUNTER — Other Ambulatory Visit: Payer: Self-pay

## 2021-12-28 DIAGNOSIS — G3184 Mild cognitive impairment, so stated: Secondary | ICD-10-CM

## 2021-12-28 DIAGNOSIS — R4189 Other symptoms and signs involving cognitive functions and awareness: Secondary | ICD-10-CM

## 2021-12-28 MED ORDER — DONEPEZIL HCL 10 MG PO TABS: 10.0000 mg | ORAL_TABLET | Freq: Every day | ORAL | 0 refills | Status: DC

## 2022-01-05 ENCOUNTER — Ambulatory Visit: Payer: Medicare Other | Admitting: Pulmonary Disease

## 2022-01-05 ENCOUNTER — Encounter: Payer: Self-pay | Admitting: Pulmonary Disease

## 2022-01-05 VITALS — BP 130/72 | HR 66 | Temp 97.5°F | Ht 72.0 in | Wt 198.6 lb

## 2022-01-05 DIAGNOSIS — Z9989 Dependence on other enabling machines and devices: Secondary | ICD-10-CM | POA: Diagnosis not present

## 2022-01-05 DIAGNOSIS — G4733 Obstructive sleep apnea (adult) (pediatric): Secondary | ICD-10-CM

## 2022-01-05 NOTE — Patient Instructions (Signed)
Continue using CPAP on a nightly basis  I will see you a year from now  Call us with any significant concerns  Your compliance data shows that the CPAP is working well

## 2022-01-05 NOTE — Progress Notes (Signed)
[name]    [id number]    [birth year]  Primary Care Physician:Hunter, [name], MD  Referring Physician: [place] [name], [name],  [name] [id number]  Chief complaint:   Patient with a history of obstructive sleep apnea In for follow-up  HPI:  No significant concerns  Continues to use CPAP on a nightly basis  Waking up feeling like he is at a good nights rest  No health issues since the last visit  Obstructive sleep apnea was diagnosed over 20 years ago  Diagnosed with mild obstructive sleep apnea August 2021 Has been using his machine since September 2022  Usually goes to bed about 11 PM, falls asleep immediately Final wake up time 6:30 AM  Continues to benefit from using CPAP on a regular basis  He has hypertension, diabetes Was treated for colon cancer 89  He feels well  Spouse is concerned that his sleep apnea is possibly suboptimally treated at present  Outpatient Encounter Medications as of 01/05/2022  Medication Sig   amLODipine (NORVASC) 5 MG tablet TAKE 1 TABLET BY MOUTH EVERY DAY   donepezil (ARICEPT) 10 MG tablet TAKE 1 TABLET BY MOUTH EVERYDAY AT BEDTIME   fluticasone (FLONASE) 50 MCG/ACT nasal spray SPRAY 2 SPRAYS INTO EACH NOSTRIL EVERY DAY   glipiZIDE (GLUCOTROL) 10 MG tablet TAKE 1 TABLET (10 MG TOTAL) BY MOUTH 2 (TWO) TIMES DAILY BEFORE A MEAL.   JANUVIA 100 MG tablet TAKE 1 TABLET BY MOUTH EVERY DAY   metFORMIN (GLUCOPHAGE) 1000 MG tablet TAKE 1 TABLET BY MOUTH 2 TIMES DAILY WITH A MEAL.   Multiple Vitamins-Minerals (CENTRUM SILVER PO) Take 1 tablet by mouth daily.    ONETOUCH ULTRA test strip USE ONCE DAILY AS INSTRUCTED   rosuvastatin (CRESTOR) 20 MG tablet TAKE 1 TABLET BY MOUTH ONE TIME PER WEEK   telmisartan (MICARDIS) 40 MG tablet TAKE 1 TABLET BY MOUTH EVERY DAY   [DISCONTINUED] donepezil (ARICEPT) 10 MG tablet Take 1 tablet (10 mg total) by mouth at bedtime.   No facility-administered encounter  medications on file as of 01/05/2022.    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 All started after a hemorrhoid surgery and later with urinary retention. Patient at one point was septic from urinary issues.     Diabetes mellitus type II, controlled 04/23/2007    Metformin 1g BID, amaryl '4mg'$ --> '8mg'$ , had to add [place] back  Never took victoza.   Januvia-lethargic and dizzy in past. 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   History of shingles    Hyperlipidemia associated with type 2 diabetes mellitus 04/23/2007   Atorvastatin '20mg'$  once weekly     Hypertension associated with diabetes 04/23/2007   Amlodipine '5mg'$ , telmisartan '40mg'$      Iritis 12/17/2008  Shingles 2010    Mild neurocognitive disorder 12/23/2019   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   Obstructive sleep apnea 04/23/2007   uses CPAP nightly   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   Sepsis secondary to UTI 03/29/2015    Past Surgical History:  Procedure Laterality Date   AMPUTATION TOE Right 09/14/2016   Procedure: RIGHT 2ND TOE/RAY AMPUTATION;  Surgeon: Walter Koyanagi, MD;  Location: [name];  Service: Orthopedics;  Laterality: Right;   APPENDECTOMY  05/03/1947   COLECTOMY  05/02/1990   CYSTOSCOPY  N/A 06/21/2015   Procedure: CYSTOSCOPY, CLOT EVACUATION, AND CAUTERIZATION OF PROSTATE FOSSA;  Surgeon: Walter Clines, MD;  Location: [name];  Service: Urology;  Laterality: N/A;   HEMORRHOID SURGERY     MOLE REMOVAL  2022   hand   TOE SURGERY  04/2020   TRANSURETHRAL RESECTION OF PROSTATE N/A 05/23/2015   Procedure: TRANSURETHRAL RESECTION OF THE PROSTATE (TURP);  Surgeon: Walter Seal, MD;  Location: [name];  Service: Urology;  Laterality: N/A;    Family History  Problem Relation Age of Onset   Colon cancer Father 22   Diabetes Father        type I   Alzheimer's disease Mother    Cancer Other        colon/fhx   Rectal cancer Maternal Uncle    Colon cancer Paternal Grandmother 102   Esophageal cancer Neg Hx    Stomach cancer Neg Hx     Social History   Socioeconomic History   Marital status: Married    Spouse name: Not on file   Number of children: Not on file   Years of education: 41   Highest education level: Master's degree (e.g., MA, MS, MEng, MEd, MSW, MBA)  Occupational History   Occupation: Retired  Tobacco Use   Smoking status: Former    Packs/day: 0.25    Years: 10.00    Total pack years: 2.50    Types: Pipe, Cigarettes   Smokeless tobacco: Never  Vaping Use   Vaping Use: Never used  Substance and Sexual Activity   Alcohol use: Yes    Alcohol/week: 2.0 standard drinks of alcohol    Types: 2 Cans of beer per week    Comment: social   Drug use: No   Sexual activity: Not Currently  Other Topics Concern   Not on file  Social History Narrative   Married 1966. 2 daughters [place] [name] divorced lives in [name] works for [employer] [name] no kids and [name] never married in [name], [name] working for [name] for [employer] improvement no kids.       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)      No religious beliefs affecting health care, no afterlife beliefs      Right Handed      Two Story  Home   Social Determinants of Health   Financial Resource Strain: Low Risk  (05/04/2021)   Overall Financial Resource Strain (CARDIA)    Difficulty of Paying Living Expenses: Not hard at all  Food Insecurity: No Food Insecurity (05/04/2021)   Hunger Vital Sign    Worried About Running Out of Food in the Last Year: Never true    Ran Out of Food in the Last Year: Never true  Transportation Needs: No Transportation Needs (05/04/2021)   PRAPARE - Transportation    Lack of Transportation (Medical): No    Lack of  Transportation (Non-Medical): No  Physical Activity: Inactive (05/04/2021)   Exercise Vital Sign    Days of Exercise per Week: 0 days    Minutes of Exercise per Session: 0 min  Stress: No Stress Concern Present (05/04/2021)   [place]    Feeling of Stress : Not at all  Social Connections: Moderately Integrated (05/04/2021)   Social Connection and Isolation Panel [NHANES]    Frequency of Communication with Friends and Family: Twice a week    Frequency of Social Gatherings with Friends and Family: More than three times a week    Attends Religious Services: Never    Marine scientist or Organizations: Yes    Attends Archivist Meetings: 1 to 4 times per year    Marital Status: Married  Human resources officer Violence: Not At Risk (05/04/2021)   Humiliation, Afraid, Rape, and Kick questionnaire    Fear of Current or Ex-Partner: No    Emotionally Abused: No    Physically Abused: No    Sexually Abused: No    Review of Systems  Respiratory:  Positive for apnea.   Psychiatric/Behavioral:  Positive for sleep disturbance.   All other systems reviewed and are negative.   Vitals:   01/05/22 0903  BP: 130/72  Pulse: 66  Temp: (!) 97.5 F (36.4 C)  SpO2: 97%     Physical Exam Constitutional:      Appearance: Normal appearance.  HENT:     Head: Normocephalic.     Mouth/Throat:     Mouth: Mucous membranes  are moist.  Eyes:     General:        Right eye: No discharge.  Cardiovascular:     Rate and Rhythm: Normal rate and regular rhythm.     Pulses: Normal pulses.     Heart sounds: Normal heart sounds. No murmur heard.    No friction rub.  Pulmonary:     Effort: Pulmonary effort is normal. No respiratory distress.     Breath sounds: Normal breath sounds. No stridor. No wheezing or rhonchi.  Musculoskeletal:     Cervical back: No rigidity or tenderness.  Neurological:     Mental Status: He is alert.  Psychiatric:        Mood and Affect: Mood normal.    Compliance shows 100% compliance Average use of 6 hours 48 minutes Set 5-15 AHI 1.3  Assessment:  Mild obstructive sleep apnea -Appears well treated with CPAP at present -Continues to benefit from CPAP use  Compliance continues to be great  Health has been stable since last visit  Plan/Recommendations: Follow-up in a year  Encouraged to give [place] a call with any significant concerns  Call us with any issues with his CPAP  Sherrilyn Rist MD [facility] 01/05/2022, 9:11 AM  CC: Walter Olp, MD

## 2022-01-10 LAB — HM DIABETES EYE EXAM

## 2022-01-22 ENCOUNTER — Encounter: Payer: Self-pay | Admitting: Pulmonary Disease

## 2022-01-24 ENCOUNTER — Encounter: Payer: Self-pay | Admitting: *Deleted

## 2022-01-26 ENCOUNTER — Inpatient Hospital Stay (HOSPITAL_COMMUNITY): Payer: Medicare Other | Admitting: Anesthesiology

## 2022-01-26 ENCOUNTER — Encounter (HOSPITAL_COMMUNITY): Payer: Self-pay | Admitting: Emergency Medicine

## 2022-01-26 ENCOUNTER — Emergency Department (HOSPITAL_COMMUNITY): Payer: Medicare Other

## 2022-01-26 ENCOUNTER — Telehealth: Payer: Self-pay | Admitting: Family Medicine

## 2022-01-26 ENCOUNTER — Encounter (HOSPITAL_COMMUNITY)
Admission: EM | Disposition: A | Discharge status: Home / Self Care | Payer: Self-pay | Source: Home / Self Care | Attending: Family Medicine

## 2022-01-26 ENCOUNTER — Inpatient Hospital Stay (HOSPITAL_COMMUNITY)
Admission: EM | Admit: 2022-01-26 | Discharge: 2022-01-29 | DRG: 378 | Disposition: A | Discharge status: Home / Self Care | Payer: Medicare Other | Attending: Family Medicine | Admitting: Family Medicine

## 2022-01-26 DIAGNOSIS — Z87891 Personal history of nicotine dependence: Secondary | ICD-10-CM

## 2022-01-26 DIAGNOSIS — G4730 Sleep apnea, unspecified: Secondary | ICD-10-CM | POA: Diagnosis not present

## 2022-01-26 DIAGNOSIS — E119 Type 2 diabetes mellitus without complications: Secondary | ICD-10-CM

## 2022-01-26 DIAGNOSIS — K259 Gastric ulcer, unspecified as acute or chronic, without hemorrhage or perforation: Secondary | ICD-10-CM

## 2022-01-26 DIAGNOSIS — K449 Diaphragmatic hernia without obstruction or gangrene: Secondary | ICD-10-CM | POA: Diagnosis present

## 2022-01-26 DIAGNOSIS — I10 Essential (primary) hypertension: Secondary | ICD-10-CM

## 2022-01-26 DIAGNOSIS — Z833 Family history of diabetes mellitus: Secondary | ICD-10-CM

## 2022-01-26 DIAGNOSIS — K2951 Unspecified chronic gastritis with bleeding: Secondary | ICD-10-CM | POA: Diagnosis present

## 2022-01-26 DIAGNOSIS — D62 Acute posthemorrhagic anemia: Secondary | ICD-10-CM

## 2022-01-26 DIAGNOSIS — K279 Peptic ulcer, site unspecified, unspecified as acute or chronic, without hemorrhage or perforation: Secondary | ICD-10-CM | POA: Diagnosis not present

## 2022-01-26 DIAGNOSIS — R911 Solitary pulmonary nodule: Secondary | ICD-10-CM | POA: Diagnosis present

## 2022-01-26 DIAGNOSIS — I700 Atherosclerosis of aorta: Secondary | ICD-10-CM | POA: Diagnosis present

## 2022-01-26 DIAGNOSIS — K922 Gastrointestinal hemorrhage, unspecified: Secondary | ICD-10-CM | POA: Diagnosis present

## 2022-01-26 DIAGNOSIS — E785 Hyperlipidemia, unspecified: Secondary | ICD-10-CM | POA: Diagnosis present

## 2022-01-26 DIAGNOSIS — K264 Chronic or unspecified duodenal ulcer with hemorrhage: Principal | ICD-10-CM

## 2022-01-26 DIAGNOSIS — K760 Fatty (change of) liver, not elsewhere classified: Secondary | ICD-10-CM | POA: Diagnosis present

## 2022-01-26 DIAGNOSIS — Z820 Family history of epilepsy and other diseases of the nervous system: Secondary | ICD-10-CM

## 2022-01-26 DIAGNOSIS — N400 Enlarged prostate without lower urinary tract symptoms: Secondary | ICD-10-CM | POA: Diagnosis present

## 2022-01-26 DIAGNOSIS — Z800 Family history of malignant neoplasm of digestive organs: Secondary | ICD-10-CM | POA: Diagnosis not present

## 2022-01-26 DIAGNOSIS — E872 Acidosis, unspecified: Secondary | ICD-10-CM | POA: Diagnosis present

## 2022-01-26 DIAGNOSIS — G4733 Obstructive sleep apnea (adult) (pediatric): Secondary | ICD-10-CM | POA: Diagnosis present

## 2022-01-26 DIAGNOSIS — Z86010 Personal history of colonic polyps: Secondary | ICD-10-CM

## 2022-01-26 DIAGNOSIS — Z8619 Personal history of other infectious and parasitic diseases: Secondary | ICD-10-CM

## 2022-01-26 DIAGNOSIS — I152 Hypertension secondary to endocrine disorders: Secondary | ICD-10-CM | POA: Diagnosis present

## 2022-01-26 DIAGNOSIS — Z89421 Acquired absence of other right toe(s): Secondary | ICD-10-CM | POA: Diagnosis not present

## 2022-01-26 DIAGNOSIS — K921 Melena: Secondary | ICD-10-CM

## 2022-01-26 DIAGNOSIS — F0280 Dementia in other diseases classified elsewhere without behavioral disturbance: Secondary | ICD-10-CM | POA: Diagnosis present

## 2022-01-26 DIAGNOSIS — M353 Polymyalgia rheumatica: Secondary | ICD-10-CM | POA: Diagnosis present

## 2022-01-26 DIAGNOSIS — G3184 Mild cognitive impairment, so stated: Secondary | ICD-10-CM | POA: Diagnosis present

## 2022-01-26 DIAGNOSIS — B9681 Helicobacter pylori [H. pylori] as the cause of diseases classified elsewhere: Secondary | ICD-10-CM | POA: Diagnosis present

## 2022-01-26 DIAGNOSIS — R933 Abnormal findings on diagnostic imaging of other parts of digestive tract: Secondary | ICD-10-CM | POA: Diagnosis not present

## 2022-01-26 DIAGNOSIS — Z85038 Personal history of other malignant neoplasm of large intestine: Secondary | ICD-10-CM

## 2022-01-26 DIAGNOSIS — K5909 Other constipation: Secondary | ICD-10-CM | POA: Diagnosis present

## 2022-01-26 DIAGNOSIS — E1169 Type 2 diabetes mellitus with other specified complication: Secondary | ICD-10-CM | POA: Diagnosis present

## 2022-01-26 DIAGNOSIS — K8689 Other specified diseases of pancreas: Secondary | ICD-10-CM | POA: Diagnosis present

## 2022-01-26 DIAGNOSIS — Z7982 Long term (current) use of aspirin: Secondary | ICD-10-CM | POA: Diagnosis not present

## 2022-01-26 DIAGNOSIS — E1141 Type 2 diabetes mellitus with diabetic mononeuropathy: Secondary | ICD-10-CM | POA: Diagnosis present

## 2022-01-26 DIAGNOSIS — D649 Anemia, unspecified: Secondary | ICD-10-CM

## 2022-01-26 DIAGNOSIS — Z79899 Other long term (current) drug therapy: Secondary | ICD-10-CM | POA: Diagnosis not present

## 2022-01-26 DIAGNOSIS — Z881 Allergy status to other antibiotic agents status: Secondary | ICD-10-CM

## 2022-01-26 DIAGNOSIS — Z9079 Acquired absence of other genital organ(s): Secondary | ICD-10-CM

## 2022-01-26 DIAGNOSIS — F0670 Mild neurocognitive disorder due to known physiological condition without behavioral disturbance: Secondary | ICD-10-CM | POA: Diagnosis present

## 2022-01-26 DIAGNOSIS — Z7984 Long term (current) use of oral hypoglycemic drugs: Secondary | ICD-10-CM

## 2022-01-26 DIAGNOSIS — Z9049 Acquired absence of other specified parts of digestive tract: Secondary | ICD-10-CM

## 2022-01-26 HISTORY — PX: HOT HEMOSTASIS: SHX5433

## 2022-01-26 HISTORY — PX: HEMOSTASIS CONTROL: SHX6838

## 2022-01-26 HISTORY — PX: HEMOSTASIS CLIP PLACEMENT: SHX6857

## 2022-01-26 HISTORY — PX: SCLEROTHERAPY: SHX6841

## 2022-01-26 HISTORY — DX: Gastrointestinal hemorrhage, unspecified: K92.2

## 2022-01-26 HISTORY — PX: ESOPHAGOGASTRODUODENOSCOPY (EGD) WITH PROPOFOL: SHX5813

## 2022-01-26 LAB — COMPREHENSIVE METABOLIC PANEL
ALT: 19 U/L (ref 0–44)
AST: 19 U/L (ref 15–41)
Albumin: 3.4 g/dL — ABNORMAL LOW (ref 3.5–5.0)
Alkaline Phosphatase: 41 U/L (ref 38–126)
Anion gap: 12 (ref 5–15)
BUN: 44 mg/dL — ABNORMAL HIGH (ref 8–23)
CO2: 19 mmol/L — ABNORMAL LOW (ref 22–32)
Calcium: 9 mg/dL (ref 8.9–10.3)
Chloride: 106 mmol/L (ref 98–111)
Creatinine, Ser: 1.12 mg/dL (ref 0.61–1.24)
GFR, Estimated: >60 mL/min (ref >60)
Glucose, Bld: 272 mg/dL — ABNORMAL HIGH (ref 70–99)
Potassium: 4.4 mmol/L (ref 3.5–5.1)
Sodium: 137 mmol/L (ref 135–145)
Total Bilirubin: 0.5 mg/dL (ref 0.3–1.2)
Total Protein: 5.7 g/dL — ABNORMAL LOW (ref 6.5–8.1)

## 2022-01-26 LAB — CBC
HCT: 21.6 % — ABNORMAL LOW (ref 39.0–52.0)
HCT: 21.1 % — ABNORMAL LOW (ref 39.0–52.0)
Hemoglobin: 7.3 g/dL — ABNORMAL LOW (ref 13.0–17.0)
Hemoglobin: 7 g/dL — ABNORMAL LOW (ref 13.0–17.0)
MCH: 32.6 pg (ref 26.0–34.0)
MCH: 30.3 pg (ref 26.0–34.0)
MCHC: 33.8 g/dL (ref 30.0–36.0)
MCHC: 33.2 g/dL (ref 30.0–36.0)
MCV: 96.4 fL (ref 80.0–100.0)
MCV: 91.3 fL (ref 80.0–100.0)
Platelets: 275 10*3/uL (ref 150–400)
Platelets: 170 10*3/uL (ref 150–400)
RBC: 2.24 MIL/uL — ABNORMAL LOW (ref 4.22–5.81)
RBC: 2.31 MIL/uL — ABNORMAL LOW (ref 4.22–5.81)
RDW: 14.6 % (ref 11.5–15.5)
RDW: 16 % — ABNORMAL HIGH (ref 11.5–15.5)
WBC: 21.4 10*3/uL — ABNORMAL HIGH (ref 4.0–10.5)
WBC: 11.5 10*3/uL — ABNORMAL HIGH (ref 4.0–10.5)
nRBC: 0 % (ref 0.0–0.2)
nRBC: 0 % (ref 0.0–0.2)

## 2022-01-26 LAB — I-STAT CHEM 8, ED
BUN: 45 mg/dL — ABNORMAL HIGH (ref 8–23)
Calcium, Ion: 1.23 mmol/L (ref 1.15–1.40)
Chloride: 105 mmol/L (ref 98–111)
Creatinine, Ser: 0.8 mg/dL (ref 0.61–1.24)
Glucose, Bld: 267 mg/dL — ABNORMAL HIGH (ref 70–99)
HCT: 22 % — ABNORMAL LOW (ref 39.0–52.0)
Hemoglobin: 7.5 g/dL — ABNORMAL LOW (ref 13.0–17.0)
Potassium: 4.3 mmol/L (ref 3.5–5.1)
Sodium: 136 mmol/L (ref 135–145)
TCO2: 20 mmol/L — ABNORMAL LOW (ref 22–32)

## 2022-01-26 LAB — PROTIME-INR
INR: 1.2 (ref 0.8–1.2)
Prothrombin Time: 15 seconds (ref 11.4–15.2)

## 2022-01-26 LAB — GLUCOSE, CAPILLARY
Glucose-Capillary: 216 mg/dL — ABNORMAL HIGH (ref 70–99)
Glucose-Capillary: 269 mg/dL — ABNORMAL HIGH (ref 70–99)
Glucose-Capillary: 294 mg/dL — ABNORMAL HIGH (ref 70–99)

## 2022-01-26 LAB — LACTIC ACID, PLASMA: Lactic Acid, Venous: 6 mmol/L (ref 0.5–1.9)

## 2022-01-26 LAB — ABO/RH: ABO/RH(D): O POS

## 2022-01-26 LAB — PREPARE RBC (CROSSMATCH)

## 2022-01-26 SURGERY — ESOPHAGOGASTRODUODENOSCOPY (EGD) WITH PROPOFOL
Anesthesia: Monitor Anesthesia Care

## 2022-01-26 MED ORDER — HYDRALAZINE HCL 20 MG/ML IJ SOLN: 5.0000 mg | INTRAMUSCULAR | Status: DC | PRN

## 2022-01-26 MED ORDER — SODIUM CHLORIDE 0.9% IV SOLUTION: Freq: Once | INTRAVENOUS | Status: AC

## 2022-01-26 MED ORDER — LACTATED RINGERS IV SOLN: INTRAVENOUS | Status: DC

## 2022-01-26 MED ORDER — PHENYLEPHRINE 80 MCG/ML (10ML) SYRINGE FOR IV PUSH (FOR BLOOD PRESSURE SUPPORT)
PREFILLED_SYRINGE | INTRAVENOUS | Status: AC
  Filled 2022-01-26: qty 10

## 2022-01-26 MED ORDER — SODIUM CHLORIDE (PF) 0.9 % IJ SOLN
PREFILLED_SYRINGE | INTRAMUSCULAR | Status: DC | PRN
  Administered 2022-01-26: 3 mL

## 2022-01-26 MED ORDER — LIDOCAINE 2% (20 MG/ML) 5 ML SYRINGE
INTRAMUSCULAR | Status: AC
  Filled 2022-01-26: qty 5

## 2022-01-26 MED ORDER — FENTANYL CITRATE (PF) 250 MCG/5ML IJ SOLN
INTRAMUSCULAR | Status: AC
  Filled 2022-01-26: qty 5

## 2022-01-26 MED ORDER — PANTOPRAZOLE INFUSION (NEW) - SIMPLE MED
8.0000 mg/h | INTRAVENOUS | Status: DC
  Administered 2022-01-26 – 2022-01-27 (×4): 8 mg/h via INTRAVENOUS
  Filled 2022-01-26 (×2): qty 100
  Filled 2022-01-26: qty 80
  Filled 2022-01-26: qty 100
  Filled 2022-01-26: qty 80
  Filled 2022-01-26 (×2): qty 100

## 2022-01-26 MED ORDER — ACETAMINOPHEN 650 MG RE SUPP: 650.0000 mg | Freq: Four times a day (QID) | RECTAL | Status: DC | PRN

## 2022-01-26 MED ORDER — PROPOFOL 500 MG/50ML IV EMUL
INTRAVENOUS | Status: DC | PRN
  Administered 2022-01-26: 100 ug/kg/min via INTRAVENOUS

## 2022-01-26 MED ORDER — IOHEXOL 350 MG/ML SOLN
80.0000 mL | Freq: Once | INTRAVENOUS | Status: AC | PRN
  Administered 2022-01-26: 80 mL via INTRAVENOUS

## 2022-01-26 MED ORDER — SUCCINYLCHOLINE CHLORIDE 200 MG/10ML IV SOSY
PREFILLED_SYRINGE | INTRAVENOUS | Status: AC
  Filled 2022-01-26: qty 10

## 2022-01-26 MED ORDER — AMLODIPINE BESYLATE 5 MG PO TABS
5.0000 mg | ORAL_TABLET | Freq: Every day | ORAL | Status: DC
  Administered 2022-01-27: 5 mg via ORAL
  Filled 2022-01-26: qty 1

## 2022-01-26 MED ORDER — SODIUM CHLORIDE 0.9 % IV SOLN: INTRAVENOUS | Status: DC

## 2022-01-26 MED ORDER — METOCLOPRAMIDE HCL 5 MG/ML IJ SOLN
5.0000 mg | Freq: Four times a day (QID) | INTRAMUSCULAR | Status: AC
  Administered 2022-01-27 (×2): 5 mg via INTRAVENOUS
  Filled 2022-01-26 (×2): qty 2

## 2022-01-26 MED ORDER — ONDANSETRON HCL 4 MG/2ML IJ SOLN: 4.0000 mg | Freq: Four times a day (QID) | INTRAMUSCULAR | Status: DC | PRN

## 2022-01-26 MED ORDER — MORPHINE SULFATE (PF) 2 MG/ML IV SOLN: 2.0000 mg | INTRAVENOUS | Status: DC | PRN

## 2022-01-26 MED ORDER — PANTOPRAZOLE 80MG IVPB - SIMPLE MED
80.0000 mg | Freq: Once | INTRAVENOUS | Status: AC
  Administered 2022-01-26: 80 mg via INTRAVENOUS
  Filled 2022-01-26: qty 80

## 2022-01-26 MED ORDER — PROPOFOL 10 MG/ML IV BOLUS
INTRAVENOUS | Status: AC
  Filled 2022-01-26: qty 20

## 2022-01-26 MED ORDER — LIDOCAINE 2% (20 MG/ML) 5 ML SYRINGE
INTRAMUSCULAR | Status: DC | PRN
  Administered 2022-01-26: 40 mg via INTRAVENOUS

## 2022-01-26 MED ORDER — SODIUM CHLORIDE 0.9 % IV BOLUS
1000.0000 mL | Freq: Once | INTRAVENOUS | Status: AC
  Administered 2022-01-26: 1000 mL via INTRAVENOUS

## 2022-01-26 MED ORDER — SODIUM CHLORIDE 0.9% FLUSH
3.0000 mL | Freq: Two times a day (BID) | INTRAVENOUS | Status: DC
  Administered 2022-01-27 – 2022-01-29 (×4): 3 mL via INTRAVENOUS

## 2022-01-26 MED ORDER — INSULIN ASPART 100 UNIT/ML IJ SOLN
0.0000 [IU] | Freq: Three times a day (TID) | INTRAMUSCULAR | Status: DC
  Administered 2022-01-27 (×2): 5 [IU] via SUBCUTANEOUS
  Administered 2022-01-28: 3 [IU] via SUBCUTANEOUS
  Administered 2022-01-28: 8 [IU] via SUBCUTANEOUS
  Administered 2022-01-28: 3 [IU] via SUBCUTANEOUS
  Administered 2022-01-29 (×2): 5 [IU] via SUBCUTANEOUS

## 2022-01-26 MED ORDER — INSULIN ASPART 100 UNIT/ML IJ SOLN
0.0000 [IU] | Freq: Every day | INTRAMUSCULAR | Status: DC
  Administered 2022-01-26: 3 [IU] via SUBCUTANEOUS
  Administered 2022-01-28: 2 [IU] via SUBCUTANEOUS

## 2022-01-26 MED ORDER — PHENYLEPHRINE 80 MCG/ML (10ML) SYRINGE FOR IV PUSH (FOR BLOOD PRESSURE SUPPORT)
PREFILLED_SYRINGE | INTRAVENOUS | Status: DC | PRN
  Administered 2022-01-26: 240 ug via INTRAVENOUS
  Administered 2022-01-26: 320 ug via INTRAVENOUS

## 2022-01-26 MED ORDER — FLUTICASONE PROPIONATE 50 MCG/ACT NA SUSP
2.0000 | Freq: Every day | NASAL | Status: DC
  Filled 2022-01-26: qty 16

## 2022-01-26 MED ORDER — EPHEDRINE 5 MG/ML INJ
INTRAVENOUS | Status: AC
  Filled 2022-01-26: qty 5

## 2022-01-26 MED ORDER — ROCURONIUM BROMIDE 10 MG/ML (PF) SYRINGE
PREFILLED_SYRINGE | INTRAVENOUS | Status: AC
  Filled 2022-01-26: qty 10

## 2022-01-26 MED ORDER — DONEPEZIL HCL 5 MG PO TABS
10.0000 mg | ORAL_TABLET | Freq: Every day | ORAL | Status: DC
  Administered 2022-01-27 – 2022-01-28 (×2): 10 mg via ORAL
  Filled 2022-01-26: qty 2
  Filled 2022-01-26 (×2): qty 1

## 2022-01-26 MED ORDER — IRBESARTAN 150 MG PO TABS
150.0000 mg | ORAL_TABLET | Freq: Every day | ORAL | Status: DC
  Administered 2022-01-27: 150 mg via ORAL
  Filled 2022-01-26: qty 1

## 2022-01-26 MED ORDER — ACETAMINOPHEN 325 MG PO TABS: 650.0000 mg | ORAL_TABLET | Freq: Four times a day (QID) | ORAL | Status: DC | PRN

## 2022-01-26 MED ORDER — ONDANSETRON HCL 4 MG PO TABS: 4.0000 mg | ORAL_TABLET | Freq: Four times a day (QID) | ORAL | Status: DC | PRN

## 2022-01-26 SURGICAL SUPPLY — 15 items

## 2022-01-26 NOTE — Telephone Encounter (Signed)
Patient Name: [name] Gender: Male DOB: 1940/11/17 Age: 81 Y 2 M 7 D Return Phone Number: [phone] (Primary) Address: City/ State/ Zip: [place]  [address] [facility] Provider [name]- MD Contact Type Call Who Is Calling Patient / Member / Family / Caregiver Call Type Triage / Clinical Caller Name Derald Lorge Relationship To Patient Spouse Return Phone Number [phone] (Primary) Chief Complaint CONFUSION - new onset Reason for Call Request to Schedule Office Appointment Initial Comment Caller states husband is weak, legs are weak, breaking out in sweats, not feeling self and is out of it. She wants an appt. Translation No Nurse Assessment Nurse: Clovis Riley, RN, Georgina Peer Date/Time (Eastern Time): 01/26/2022 8:06:30 AM Confirm and document reason for call. If symptomatic, describe symptoms. ---Caller states husband is weak, legs are weak, breaking out in sweats, not feeling self and is out of it that started on monday after mowing the lawn. Bp was 100/68and hr was 82 at 2318. Last urine was this morning at 0330. Does the patient have any new or worsening symptoms? ---Yes Will a triage be completed? ---Yes Related visit to physician within the last 2 weeks? ---No Does the PT have any chronic conditions? (i.e. diabetes, asthma, this includes High risk factors for pregnancy, etc.) ---Yes List chronic conditions. ---diabetes, HTN, cholesterol Is this a behavioral health or substance abuse call? ---No Guidelines Guideline Title Affirmed Question Affirmed Notes Nurse Date/Time (Eastern Time) Weakness (Generalized) and Fatigue [1] SEVERE weakness (i.e., unable to walk or barely able to walk, Clovis Riley, RNGeorgina Peer 01/26/2022 8:09:49 AM  Guidelines Guideline Title Affirmed Question Affirmed Notes Nurse Date/Time Eilene Ghazi Time) requires support) AND [2]  new-onset or worsening Disp. Time Eilene Ghazi Time) Disposition Final User 01/26/2022 8:04:44 AM Send to Urgent Joana Reamer 01/26/2022 8:13:53 AM Call EMS 911 Now Yes Clovis Riley, RN, Georgina Peer 01/26/2022 8:16:48 AM 911 Outcome Documentation [place], RN, Georgina Peer Reason: wife is unsure if she will call 911 or just drive to the ER Final Disposition 01/26/2022 8:13:53 AM Call EMS 911 Now Yes Clovis Riley, RN, Leilani Merl Disagree/Comply Comply Caller Understands Yes PreDisposition Did not know what to do Care Advice Given Per Guideline CALL EMS 911 NOW: * Immediate medical attention is needed. You need to hang up and call 911 (or an ambulance). * Triager Discretion: I'll call you back in a few minutes to be sure you were able to reach them. CARE ADVICE given per Weakness and Fatigue (Adult) guideline. Comments User: Arman Bogus, RN Date/Time Eilene Ghazi Time): 01/26/2022 8:12:19 AM States yesterday he had black stools Referrals GO TO FACILITY UNDECIDED

## 2022-01-26 NOTE — Anesthesia Preprocedure Evaluation (Addendum)
Anesthesia Evaluation  Patient identified by MRN, date of birth, ID band Patient awake    Reviewed: Allergy & Precautions, Patient's Chart, lab work & pertinent test results  History of Anesthesia Complications Negative for: history of anesthetic complications  Airway Mallampati: II  TM Distance: >3 FB Neck ROM: Full    Dental no notable dental hx.    Pulmonary sleep apnea and Continuous Positive Airway Pressure Ventilation , former smoker,    Pulmonary exam normal        Cardiovascular hypertension, Pt. on medications  Rhythm:Regular Rate:Normal     Neuro/Psych  Neuromuscular disease negative psych ROS   GI/Hepatic negative GI ROS, Neg liver ROS,   Endo/Other  diabetes, Type 2, Oral Hypoglycemic Agents  Renal/GU negative Renal ROS     Musculoskeletal negative musculoskeletal ROS (+)   Abdominal Normal abdominal exam  (+)   Peds  Hematology  (+) Blood dyscrasia, anemia ,   Anesthesia Other Findings   Reproductive/Obstetrics                            Anesthesia Physical Anesthesia Plan  ASA: 2  Anesthesia Plan: MAC   Post-op Pain Management:    Induction:   PONV Risk Score and Plan: 1 and Propofol infusion and Treatment may vary due to age or medical condition  Airway Management Planned: Nasal Cannula and Natural Airway  Additional Equipment: None  Intra-op Plan:   Post-operative Plan:   Informed Consent: I have reviewed the patients History and Physical, chart, labs and discussed the procedure including the risks, benefits and alternatives for the proposed anesthesia with the patient or authorized representative who has indicated his/her understanding and acceptance.     Dental advisory given  Plan Discussed with: CRNA and Anesthesiologist  Anesthesia Plan Comments:        Anesthesia Quick Evaluation

## 2022-01-26 NOTE — ED Provider Notes (Signed)
[facility] EMERGENCY DEPARTMENT Provider Note   CSN: [id number] Arrival date & time: 01/26/22  1249     History  Chief Complaint  Patient presents with   GI Bleeding    Walter [name] is a 81 y.o. male.  HPI   81 year old male with medical history significant for osteomyelitis, HTN, OSA, DM 2, HLD, history of colon cancer status post partial colectomy in 1992 who presents to the emergency department with fatigue, lightheadedness and dark stools.  The patient states that he noticed fatigue and lightheadedness this past Monday.  He states that yesterday he noticed very dark sticky stools.  He has been persistently lightheaded, worse on standing for the past 2 days.  He denies any bright red blood or bloody vomitus.  He denies any fevers or chills.  He is not on anticoagulation.  Home Medications Prior to Admission medications   Medication Sig Start Date End Date Taking? Authorizing Provider  amLODipine (NORVASC) 5 MG tablet TAKE 1 TABLET BY MOUTH EVERY DAY 12/28/21   [name], MD  donepezil (ARICEPT) 10 MG tablet TAKE 1 TABLET BY MOUTH EVERYDAY AT BEDTIME 12/28/21   Cameron Sprang, MD  fluticasone [facility]) 50 MCG/ACT nasal spray SPRAY 2 SPRAYS INTO EACH NOSTRIL EVERY DAY 03/02/21   [name], MD  glipiZIDE (GLUCOTROL) 10 MG tablet TAKE 1 TABLET (10 MG TOTAL) BY MOUTH 2 (TWO) TIMES DAILY BEFORE A MEAL. 06/22/21   [name], MD  JANUVIA 100 MG tablet TAKE 1 TABLET BY MOUTH EVERY DAY 12/28/21   [name], MD  metFORMIN (GLUCOPHAGE) 1000 MG tablet TAKE 1 TABLET BY MOUTH 2 TIMES DAILY WITH A MEAL. 12/28/21   [name], MD  Multiple Vitamins-Minerals (CENTRUM SILVER PO) Take 1 tablet by mouth daily.     [provider]  [facility] ULTRA test strip USE ONCE DAILY AS INSTRUCTED 11/08/21   [name], MD  rosuvastatin (CRESTOR) 20 MG tablet TAKE 1 TABLET BY MOUTH ONE TIME PER WEEK 08/02/21   [name], MD  telmisartan  (MICARDIS) 40 MG tablet TAKE 1 TABLET BY MOUTH EVERY DAY 03/08/21   [name], MD      Allergies    Bactrim [sulfamethoxazole-trimethoprim]    Review of Systems   Review of Systems  All other systems reviewed and are negative.   Physical Exam Updated Vital Signs BP 115/65 (BP Location: Right Arm)   Pulse 85   Temp 97.7 F (36.5 C) (Oral)   Resp 15   SpO2 100%  Physical Exam Vitals and nursing note reviewed.  Constitutional:      General: He is not in acute distress.    Appearance: He is well-developed. He is ill-appearing.     Comments: Ill-appearing, pale and jaundiced  HENT:     Head: Normocephalic and atraumatic.  Eyes:     Conjunctiva/sclera: Conjunctivae normal.  Cardiovascular:     Rate and Rhythm: Normal rate and regular rhythm.  Pulmonary:     Effort: Pulmonary effort is normal. No respiratory distress.     Breath sounds: Normal breath sounds.  Abdominal:     Palpations: Abdomen is soft.     Tenderness: There is no abdominal tenderness.     Comments: Abdomen soft, nontender, nondistended, no rebound or guarding  Musculoskeletal:        General: No swelling.     Cervical back: Neck supple.  Skin:    General: Skin is warm and dry.  Capillary Refill: Capillary refill takes less than 2 seconds.     Coloration: Skin is jaundiced and pale.  Neurological:     Mental Status: He is alert.  Psychiatric:        Mood and Affect: Mood normal.     ED Results / Procedures / Treatments   Labs (all labs ordered are listed, but only abnormal results are displayed) Labs Reviewed  COMPREHENSIVE METABOLIC PANEL - Abnormal; Notable for the following components:      Result Value   CO2 19 (*)    Glucose, Bld 272 (*)    BUN 44 (*)    Total Protein 5.7 (*)    Albumin 3.4 (*)    All other components within normal limits  CBC - Abnormal; Notable for the following components:   WBC 21.4 (*)    RBC 2.24 (*)    Hemoglobin 7.3 (*)    HCT 21.6 (*)    All other  components within normal limits  LACTIC ACID, PLASMA - Abnormal; Notable for the following components:   Lactic Acid, Venous 6.0 (*)    All other components within normal limits  I-STAT CHEM 8, ED - Abnormal; Notable for the following components:   BUN 45 (*)    Glucose, Bld 267 (*)    TCO2 20 (*)    Hemoglobin 7.5 (*)    HCT 22.0 (*)    All other components within normal limits  PROTIME-INR  POC OCCULT BLOOD, ED  TYPE AND SCREEN  ABO/RH  PREPARE RBC (CROSSMATCH)    EKG None  Radiology CT ANGIO GI BLEED  Result Date: 01/26/2022 CLINICAL DATA:  Lower gastrointestinal bleeding. EXAM: CTA ABDOMEN AND PELVIS WITHOUT AND WITH CONTRAST TECHNIQUE: Multidetector CT imaging of the abdomen and pelvis was performed using the standard protocol during bolus administration of intravenous contrast. Multiplanar reconstructed images and MIPs were obtained and reviewed to evaluate the vascular anatomy. RADIATION DOSE REDUCTION: This exam was performed according to the departmental dose-optimization program which includes automated exposure control, adjustment of the mA and/or kV according to patient size and/or use of iterative reconstruction technique. CONTRAST:  62m OMNIPAQUE IOHEXOL 350 MG/ML SOLN COMPARISON:  None Available. FINDINGS: VASCULAR Aorta: Atherosclerosis of abdominal aorta is noted without aneurysm or dissection. Celiac: Patent without evidence of aneurysm, dissection, vasculitis or significant stenosis. SMA: Patent without evidence of aneurysm, dissection, vasculitis or significant stenosis. Renals: Both renal arteries are patent without evidence of aneurysm, dissection, vasculitis, fibromuscular dysplasia or significant stenosis. IMA: Patent without evidence of aneurysm, dissection, vasculitis or significant stenosis. Inflow: Patent without evidence of aneurysm, dissection, vasculitis or significant stenosis. Proximal Outflow: Bilateral common femoral and visualized portions of the  superficial and profunda femoral arteries are patent without evidence of aneurysm, dissection, vasculitis or significant stenosis. Veins: No obvious venous abnormality within the limitations of this arterial phase study. Review of the MIP images confirms the above findings. NON-VASCULAR Lower chest: 13 mm nodule is noted in left lower lobe. Hepatobiliary: No gallstones or biliary dilatation is noted. Probable hepatic steatosis. Pancreas: No acute inflammation or ductal dilatation is noted. 3.8 cm cystic abnormality is noted in pancreatic head. Spleen: Normal in size without focal abnormality. Adrenals/Urinary Tract: Adrenal glands appear normal. No hydronephrosis or renal obstruction is noted. No renal or ureteral calculi are noted. Urinary bladder is unremarkable. Two right renal cysts are noted for which no further follow-up is required. Stomach/Bowel: The stomach is unremarkable. However there appears to be contrast extravasation involving the first and  second portions of the duodenum consistent with active gastrointestinal bleeding. There is no evidence of bowel obstruction or inflammation. Postsurgical changes are seen in the rectosigmoid region. Lymphatic: No adenopathy is noted. Reproductive: Mild prostatic enlargement is noted. Postsurgical defect is noted. Other: No abdominal wall hernia or abnormality. No abdominopelvic ascites. Musculoskeletal: No acute or significant osseous findings. IMPRESSION: VASCULAR There is contrast extravasation involving the first and second portions of the duodenum consistent with active gastrointestinal bleeding. Aortic Atherosclerosis (ICD10-I70.0). NON-VASCULAR 13 mm nodule is noted in left lower lobe. PET scan is recommended for further evaluation. 3.8 cm cystic abnormality is seen in pancreatic head. 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 Indolent neoplasm, such as intraductal papillary mucinous tumor could look similar. Per consensus criteria, follow-up with preferably pre and post  contrast abdominal MRI at 1 year is recommended. This recommendation follows ACR consensus guidelines: Managing Incidental Findings on Abdominal CT: White Paper of the ACR Incidental Findings Committee. J Am Coll Radiol 2010;7:754-773. Probable hepatic steatosis. Critical Value/emergent results were called by telephone at the time of interpretation on 01/26/2022 at 3:35 pm to provider ABIGAIL HARRIS , who verbally acknowledged these results. Electronically Signed   By: Marijo Conception M.D.   On: 01/26/2022 15:36    Procedures .Critical Care  Performed by: Regan Lemming, MD Authorized by: Regan Lemming, MD   Critical care provider statement:    Critical care time (minutes):  30   Critical care was necessary to treat or prevent imminent or life-threatening deterioration of the following conditions:  Circulatory failure   Critical care was time spent personally by me on the following activities:  Development of treatment plan with patient or surrogate, discussions with consultants, evaluation of patient's response to treatment, examination of patient, ordering and review of laboratory studies, ordering and review of radiographic studies, ordering and performing treatments and interventions, pulse oximetry, re-evaluation of patient's condition and review of old charts   Care discussed with: admitting provider       Medications Ordered in ED Medications  sodium chloride 0.9 % bolus 1,000 mL (has no administration in time range)    And  0.9 %  sodium chloride infusion (has no administration in time range)  pantoprazole (PROTONIX) 80 mg /NS 100 mL IVPB (has no administration in time range)  pantoprozole (PROTONIX) 80 mg /NS 100 mL infusion (has no administration in time range)  0.9 %  sodium chloride infusion (Manually program via Guardrails IV Fluids) (has no administration in time range)  iohexol (OMNIPAQUE) 350 MG/ML injection 80 mL (80 mLs Intravenous Contrast Given 01/26/22 1515)    ED  Course/ Medical Decision Making/ A&P Clinical Course as of 01/26/22 1545  Wed Jan 26, 2022  1540 Lactic Acid, Venous(!!): 6.0 [JL]  1540 Hemoglobin(!): 7.3 [JL]    Clinical Course User Index [JL] Regan Lemming, MD                           Medical Decision Making Amount and/or Complexity of Data Reviewed Labs: ordered. Decision-making details documented in ED Course.  Risk Prescription drug management. Decision regarding hospitalization.    81 year old male with medical history significant for osteomyelitis, HTN, OSA, DM 2, HLD, history of colon cancer status post partial colectomy in 1992 who presents to the emergency department with fatigue, lightheadedness and dark stools.  The patient states that he noticed fatigue and lightheadedness this past Monday.  He states that yesterday he noticed very dark sticky stools.  He  has been persistently lightheaded, worse on standing for the past 2 days.  He denies any bright red blood or bloody vomitus.  He denies any fevers or chills.  He is not on anticoagulation.  On arrival, the patient was vitally stable, afebrile, not tachypneic, BP 115/65, sinus rhythm noted on cardiac telemetry, saturating well on room air.  Physical exam significant for a pale and ill-appearing male, abdominal exam soft, nontender, nondistended.  Concern for upper GI bleed with patient report of symptomatic anemia and melena over the past few days.  Initial Chem-8 revealed a hemoglobin of 7.5, CBC revealed a leukocytosis to 21.4 and a hemoglobin of 7.3.  Lactic acid was elevated to 6.0.  Large-bore IV access was obtained and the patient was administered initial IV fluid bolus of 1 L normal saline and started on Protonix infusion in addition to an IV Protonix bolus.  A CT angiogram was performed which revealed evidence of active bleeding in the duodenum with contrast extravasation involving the first and second portions of the duodenum consistent with active gastrointestinal  bleeding.  IMPRESSION:  VASCULAR    There is contrast extravasation involving the first and second  portions of the duodenum consistent with active gastrointestinal  bleeding.    Aortic Atherosclerosis (ICD10-I70.0).    NON-VASCULAR    13 mm nodule is noted in left lower lobe. PET scan is recommended  for further evaluation.    3.8 cm cystic abnormality is seen in pancreatic head. Most likely a  pseudocyst. Indolent neoplasm, such as intraductal papillary  mucinous tumor could look similar. Per consensus criteria, follow-up  with preferably pre and post contrast abdominal MRI at 1 year is  recommended. This recommendation follows ACR consensus guidelines:  Managing Incidental Findings on Abdominal CT: White Paper of the ACR  Incidental Findings Committee. J Am Coll Radiol 2010;7:754-773.    Probable hepatic steatosis.    Critical Value/emergent results were called by telephone at the time  of interpretation on 01/26/2022 at 3:35 pm to provider ABIGAIL HARRIS  , who verbally acknowledged these results.    On-call gastroenterology was paged.  The patient was typed and screened and crossed and 2 units PRBCs were ordered due to concern for active GI bleeding.   I spoke with Nicoletta Ba of [facility] who will come evaluate the patient. I spoke with Dr. Lorin Mercy and recommend step down admission given current stable vitals but evidence of active ongoing upper GI bleeding. Dr. Lorin Mercy accepted the patient in admission.  Final Clinical Impression(s) / ED Diagnoses Final diagnoses:  Upper GI bleed  Acute GI bleeding  Lactic acidosis  Anemia, unspecified type    Rx / DC Orders ED Discharge Orders     None         Regan Lemming, MD 01/26/22 1723

## 2022-01-26 NOTE — Consult Note (Signed)
[facility] Consult Note   History Walter [name] MRN # [id number]  Date of Admission: 01/26/2022 Date of Consultation: 01/26/2022 Referring physician: Dr. Karmen Bongo, MD Primary Care Provider: [name], MD Primary Gastroenterologist: Dr. Lucio Edward   Reason for Consultation/Chief Complaint: Upper GI bleed  Subjective  HPI:  This is a very pleasant 81 year old man accompanied by his wife who came to the ED today for progressive weakness and passage of black stool for the last 48 hours.  He first noticed it after he mowed the lawn the day before yesterday where he felt winded afterward and passed some black stool.  He has felt progressively worse and continued to pass black tarry stool until the time of ED arrival this afternoon. He was found to be markedly anemic with a hemoglobin of 7.3 as well as a significantly elevated WBC but without clinical signs of infection.  Fortunately, his blood pressure and heart rate have remained normal.  There was clinical suspicion for lower GI bleed by the ED physician, so a CT angiogram was obtained.  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 He currently denies chest pain dyspnea or abdominal pain.  He has not had chronic nausea vomiting dysphagia or weight loss.  In the last few months he has been gassier than usual, and he tends toward chronic constipation for which she has seen Dr. Fuller Plan in the past.  He also has a history of colorectal cancer with a sigmoid resection and regular surveillance colonoscopies by Dr. Fuller Plan.  Ephriam takes a baby aspirin daily, though does not have a history of coronary disease, MI, CVA or TIA.  He also has intermittent arthralgias and headaches and has been taking an unknown OTC pain medicine for that (?  NSAID).  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 All started after a hemorrhoid surgery and later with urinary retention. Patient at one point was septic from urinary issues.     Diabetes mellitus type II, controlled 04/23/2007    Metformin 1g BID, amaryl '4mg'$ --> '8mg'$ , had to add [place] back  Never took victoza.   Januvia-lethargic and dizzy in past. 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   History of shingles    Hyperlipidemia associated with type 2 diabetes mellitus 04/23/2007   Atorvastatin '20mg'$  once weekly     Hypertension associated with diabetes 04/23/2007   Amlodipine '5mg'$ , telmisartan '40mg'$      Iritis 12/17/2008   Shingles 2010    Mild neurocognitive disorder 12/23/2019   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   Obstructive sleep apnea 04/23/2007   uses CPAP nightly   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   Sepsis secondary to UTI 03/29/2015    Past Surgical History Past Surgical History:  Procedure Laterality Date   AMPUTATION TOE Right 09/14/2016   Procedure: RIGHT 2ND TOE/RAY AMPUTATION;  Surgeon: Leandrew Koyanagi, MD;  Location: [facility];  Service: Orthopedics;  Laterality: Right;   APPENDECTOMY  05/03/1947   COLECTOMY  05/02/1990   CYSTOSCOPY N/A 06/21/2015   Procedure: CYSTOSCOPY, CLOT EVACUATION, AND CAUTERIZATION OF PROSTATE FOSSA;  Surgeon: Carolan Clines, MD;  Location: [facility];  Service: Urology;   Laterality: N/A;   HEMORRHOID SURGERY     MOLE REMOVAL  2022   hand   TOE SURGERY  04/2020   TRANSURETHRAL RESECTION OF PROSTATE N/A 05/23/2015   Procedure: TRANSURETHRAL RESECTION OF THE PROSTATE (TURP);  Surgeon: Irine Seal, MD;  Location: [facility];  Service: Urology;  Laterality: N/A;    Family History Family History  Problem Relation Age of Onset   Colon cancer Father 60   Diabetes Father        type I   Alzheimer's disease Mother    Cancer Other        colon/fhx   Rectal cancer Maternal Uncle    Colon cancer Paternal Grandmother 15   Esophageal cancer Neg Hx    Stomach cancer Neg Hx     Social History Social History   Socioeconomic History   Marital status: Married    Spouse name: Not on file   Number of children: Not on file   Years of education: 18   Highest education level: Master's degree (e.g., MA, MS, MEng, MEd, MSW, MBA)  Occupational History   Occupation: Retired  Tobacco Use   Smoking status: Former    Packs/day: 0.25    Years: 10.00    Total pack years: 2.50    Types: Pipe, Cigarettes   Smokeless tobacco: Never  Vaping Use   Vaping Use: Never used  Substance and Sexual Activity   Alcohol use: Yes    Alcohol/week: 2.0 standard drinks of alcohol    Types: 2 Cans of beer per week    Comment: social   Drug use: No   Sexual activity: Not Currently  Other Topics Concern   Not on file  Social History Narrative   Married 1966. 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)      No religious beliefs affecting health care, no afterlife beliefs      Right Handed      Two Story Home   Social Determinants of Health   Financial Resource Strain: Low Risk  (05/04/2021)   Overall Financial Resource Strain (CARDIA)     Difficulty of Paying Living Expenses: Not hard at all  Food Insecurity: No Food Insecurity (05/04/2021)   Hunger Vital Sign    Worried About Running Out of Food in the Last Year: Never true    Ran Out of Food in the Last Year: Never true  Transportation Needs: No Transportation Needs (05/04/2021)   PRAPARE - Hydrologist (Medical): No    Lack of Transportation (Non-Medical): No  Physical Activity: Inactive (05/04/2021)   Exercise Vital Sign    Days of Exercise per Week: 0 days    Minutes of Exercise per Session: 0 min  Stress: No Stress Concern Present (05/04/2021)   [employer] - Occupational Stress Questionnaire    Feeling of Stress : Not at all  Social Connections: Moderately Integrated (05/04/2021)   Social Connection and Isolation Panel [NHANES]    Frequency of Communication with Friends and Family: Twice a week    Frequency of Social Gatherings with Friends and Family: More than three times a week    Attends Religious Services: Never    Marine scientist or Organizations: Yes    Attends Archivist Meetings: 1 to 4 times per year    Marital Status: Married    Allergies Allergies  Allergen Reactions   Bactrim [Sulfamethoxazole-Trimethoprim] Nausea And Vomiting    Pt has chills and fevers also.    Outpatient Meds Home medications from the H+P and/or nursing med reconciliation reviewed.  Inpatient med list reviewed  _____________________________________________________________________ Objective   Exam:  Current vital signs  Patient Vitals for the past 8 hrs:  BP Temp Temp src Pulse Resp SpO2  01/26/22 1715 (!) 127/49 -- -- 91 13 100 %  01/26/22 1713 136/64 97.8 F (36.6 C) Oral (!) 141 16 98 %  01/26/22 1700 (!) 114/55 -- -- 88 18 100 %  01/26/22 1658 (!) 116/53 97.9 F (36.6 C) Oral 88 16 100 %  01/26/22 1645 (!) 105/51 -- -- 88 15 100 %  01/26/22 1630 (!) 109/51 -- -- 84 18 100 %  01/26/22  1615 (!) 118/45 -- -- 99 (!) 31 100 %  01/26/22 1600 (!) 100/51 -- -- 89 16 100 %  01/26/22 1545 (!) 116/52 -- -- 84 (!) 9 100 %  01/26/22 1522 115/65 97.7 F (36.5 C) Oral 85 15 100 %  01/26/22 1304 113/60 97.7 F (36.5 C) Oral 100 16 100 %   No intake or output data in the 24 hours ending 01/26/22 1734  Physical Exam:   General: this is a pale and otherwise well-appearing elderly male patient in no acute distress.  He is lying on a stretcher in his ED room, he is alert and conversational, oriented x3 Eyes: sclera anicteric, no redness ENT: oral mucosa moist without lesions, no cervical or supraclavicular lymphadenopathy, good dentition CV: RRR without murmur, S1/S2, no JVD,, no peripheral edema Resp: clear to auscultation bilaterally, normal RR and effort noted GI: soft, no tenderness, with active bowel sounds. No guarding or palpable organomegaly noted Skin; warm and dry, no rash or jaundice noted.  Normal capillary refill Neuro: awake, alert and oriented x 3. Normal gross motor function and fluent speech.  Labs:     Latest Ref Rng & Units 01/26/2022    1:48 PM 01/26/2022    1:33 PM 03/02/2021   10:09 AM  CBC  WBC 4.0 - 10.5 K/uL  21.4  9.4   Hemoglobin 13.0 - 17.0 g/dL 7.5  7.3  13.7   Hematocrit 39.0 - 52.0 % 22.0  21.6  41.2   Platelets 150 - 400 K/uL  275  224.0        Latest Ref Rng & Units 01/26/2022    1:48 PM 01/26/2022    1:33 PM 07/02/2021    8:28 AM  CMP  Glucose 70 - 99 mg/dL 267  272  169   BUN 8 - 23 mg/dL 45  44  16   Creatinine 0.61 - 1.24 mg/dL 0.80  1.12  0.98   Sodium 135 - 145 mmol/L 136  137  138   Potassium 3.5 - 5.1 mmol/L 4.3  4.4  4.2   Chloride  98 - 111 mmol/L 105  106  101   CO2 22 - 32 mmol/L  19  29   Calcium 8.9 - 10.3 mg/dL  9.0  9.6   Total Protein 6.5 - 8.1 g/dL  5.7  6.9   Total Bilirubin 0.3 - 1.2 mg/dL  0.5  0.7   Alkaline Phos 38 - 126 U/L  41  62   AST 15 - 41 U/L  19  17   ALT 0 - 44 U/L  19  25     Recent Labs  Lab  01/26/22 1333  INR 1.2   _________________________________________________________ Radiologic studies:  CLINICAL DATA:  Lower gastrointestinal bleeding.   EXAM: CTA ABDOMEN AND PELVIS WITHOUT AND WITH CONTRAST   TECHNIQUE: Multidetector CT imaging of the abdomen and pelvis was performed using the standard protocol during bolus administration of intravenous contrast. Multiplanar reconstructed images and MIPs were obtained and reviewed to evaluate the vascular anatomy.   RADIATION DOSE REDUCTION: This exam was performed according to the departmental dose-optimization program which includes automated exposure control, adjustment of the mA and/or kV according to patient size and/or use of iterative reconstruction technique.   CONTRAST:  45m OMNIPAQUE IOHEXOL 350 MG/ML SOLN   COMPARISON:  None Available.   FINDINGS: VASCULAR   Aorta: Atherosclerosis of abdominal aorta is noted without aneurysm or dissection.   Celiac: Patent without evidence of aneurysm, dissection, vasculitis or significant stenosis.   SMA: Patent without evidence of aneurysm, dissection, vasculitis or significant stenosis.   Renals: Both renal arteries are patent without evidence of aneurysm, dissection, vasculitis, fibromuscular dysplasia or significant stenosis.   IMA: Patent without evidence of aneurysm, dissection, vasculitis or significant stenosis.   Inflow: Patent without evidence of aneurysm, dissection, vasculitis or significant stenosis.   Proximal Outflow: Bilateral common femoral and visualized portions of the superficial and profunda femoral arteries are patent without evidence of aneurysm, dissection, vasculitis or significant stenosis.   Veins: No obvious venous abnormality within the limitations of this arterial phase study.   Review of the MIP images confirms the above findings.   NON-VASCULAR   Lower chest: 13 mm nodule is noted in left lower lobe.   Hepatobiliary: No  gallstones or biliary dilatation is noted. Probable hepatic steatosis.   Pancreas: No acute inflammation or ductal dilatation is noted. 3.8 cm cystic abnormality is noted in pancreatic head.   Spleen: Normal in size without focal abnormality.   Adrenals/Urinary Tract: Adrenal glands appear normal. No hydronephrosis or renal obstruction is noted. No renal or ureteral calculi are noted. Urinary bladder is unremarkable. Two right renal cysts are noted for which no further follow-up is required.   Stomach/Bowel: The stomach is unremarkable. However there appears to be contrast extravasation involving the first and second portions of the duodenum consistent with active gastrointestinal bleeding. There is no evidence of bowel obstruction or inflammation. Postsurgical changes are seen in the rectosigmoid region.   Lymphatic: No adenopathy is noted.   Reproductive: Mild prostatic enlargement is noted. Postsurgical defect is noted.   Other: No abdominal wall hernia or abnormality. No abdominopelvic ascites.   Musculoskeletal: No acute or significant osseous findings.   IMPRESSION: VASCULAR   There is contrast extravasation involving the first and second portions of the duodenum consistent with active gastrointestinal bleeding.   Aortic Atherosclerosis (ICD10-I70.0).   NON-VASCULAR   13 mm nodule is noted in left lower lobe. PET scan is recommended for further evaluation.   3.8 cm cystic abnormality is seen in pancreatic  head. 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 Indolent neoplasm, such as intraductal papillary mucinous tumor could look similar. Per consensus criteria, follow-up with preferably pre and post contrast abdominal MRI at 1 year is recommended. This recommendation follows ACR consensus guidelines: Managing Incidental Findings on Abdominal CT: White Paper of the ACR Incidental Findings Committee. J Am Coll Radiol 2010;7:754-773.   Probable hepatic steatosis.   Critical  Value/emergent results were called by telephone at the time of interpretation on 01/26/2022 at 3:35 pm to provider ABIGAIL HARRIS , who verbally acknowledged these results.     Electronically Signed   By: Marijo Conception M.D.   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  His GI bleeding is probably been going on for the last 2 days and he is now markedly anemic though fortunately hemodynamically stable.  Suspected peptic ulcer from aspirin and possible other NSAID use, also possibly AVM.  This pancreatic lesion does not have radiographic appearance of malignancy nor does it appear to be invading the duodenum based on the radiologist's description.   Plan:  He has 2 peripheral IVs, has received a bolus of Protonix and is now on an infusion.  His nurse had just started the first of 2 planned units of PRBC transfusion when I was seeing him.  His hospitalist Dr. Lorin Mercy had also arrived for evaluation and admission.  I advised Eliu to have an upper endoscopy this evening for localization and treatment of the GI bleeding.  He was agreeable after discussion of procedure and risks.  The benefits and risks of the planned procedure were described in detail with the patient or (when appropriate) their health care proxy.  Risks were outlined as including, but not limited to, bleeding, infection, perforation, adverse medication reaction leading to cardiac or pulmonary decompensation, pancreatitis (if ERCP).  The limitation of incomplete mucosal visualization was also discussed.  No guarantees or warranties were given.  Patient at increased risk for cardiopulmonary  complications of procedure due to medical comorbidities.  (Age, severe anemia, sleep apnea)  Continue IV Protonix, and after his procedure we will arrange serial hemoglobin and hematocrit checks starting a few hours after the 2 units of PRBCs are transfused.  Outpatient follow-up with Dr. Fuller Plan for the incidental pancreatic lesion.  Thank you for the courtesy of this consult.  Please contact me with any questions or concerns.  Nelida Meuse III Office: [phone]

## 2022-01-26 NOTE — Anesthesia Postprocedure Evaluation (Signed)
Anesthesia Post Note  Patient: Eryc Bodey  Procedure(s) Performed: ESOPHAGOGASTRODUODENOSCOPY (EGD) WITH PROPOFOL HEMOSTASIS CLIP PLACEMENT HOT HEMOSTASIS (ARGON PLASMA COAGULATION/BICAP) HEMOSTASIS CONTROL SCLEROTHERAPY     Patient location during evaluation: PACU Anesthesia Type: MAC Level of consciousness: awake and alert Pain management: pain level controlled Vital Signs Assessment: post-procedure vital signs reviewed and stable Respiratory status: spontaneous breathing, nonlabored ventilation, respiratory function stable and patient connected to nasal cannula oxygen Cardiovascular status: stable and blood pressure returned to baseline Postop Assessment: no apparent nausea or vomiting Anesthetic complications: no   No notable events documented.  Last Vitals:  Vitals:   01/26/22 1945 01/26/22 2000  BP: 116/61 (!) 129/56  Pulse: 89 96  Resp: 18 16  Temp:  36.7 C  SpO2: 96% 96%    Last Pain:  Vitals:   01/26/22 1945  TempSrc:   PainSc: 2                  March Rummage Buelah Rennie

## 2022-01-26 NOTE — H&P (View-Only) (Signed)
[facility] Consult Note   History Walter [name] MRN # [id number]  Date of Admission: 01/26/2022 Date of Consultation: 01/26/2022 Referring physician: Dr. Karmen Bongo, MD Primary Care Provider: [name], MD Primary Gastroenterologist: Dr. Lucio Edward   Reason for Consultation/Chief Complaint: Upper GI bleed  Subjective  HPI:  This is a very pleasant 81 year old man accompanied by his wife who came to the ED today for progressive weakness and passage of black stool for the last 48 hours.  He first noticed it after he mowed the lawn the day before yesterday where he felt winded afterward and passed some black stool.  He has felt progressively worse and continued to pass black tarry stool until the time of ED arrival this afternoon. He was found to be markedly anemic with a hemoglobin of 7.3 as well as a significantly elevated WBC but without clinical signs of infection.  Fortunately, his blood pressure and heart rate have remained normal.  There was clinical suspicion for lower GI bleed by the ED physician, so a CT angiogram was obtained.  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 He currently denies chest pain dyspnea or abdominal pain.  He has not had chronic nausea vomiting dysphagia or weight loss.  In the last few months he has been gassier than usual, and he tends toward chronic constipation for which she has seen Dr. Fuller Plan in the past.  He also has a history of colorectal cancer with a sigmoid resection and regular surveillance colonoscopies by Dr. Fuller Plan.  Kendan takes a baby aspirin daily, though does not have a history of coronary disease, MI, CVA or TIA.  He also has intermittent arthralgias and headaches and has been taking an unknown OTC pain medicine for that (?  NSAID).  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 All started after a hemorrhoid surgery and later with urinary retention. Patient at one point was septic from urinary issues.     Diabetes mellitus type II, controlled 04/23/2007    Metformin 1g BID, amaryl '4mg'$ --> '8mg'$ , had to add [place] back  Never took victoza.   Januvia-lethargic and dizzy in past. 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   History of shingles    Hyperlipidemia associated with type 2 diabetes mellitus 04/23/2007   Atorvastatin '20mg'$  once weekly     Hypertension associated with diabetes 04/23/2007   Amlodipine '5mg'$ , telmisartan '40mg'$      Iritis 12/17/2008   Shingles 2010    Mild neurocognitive disorder 12/23/2019   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   Obstructive sleep apnea 04/23/2007   uses CPAP nightly   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   Sepsis secondary to UTI 03/29/2015    Past Surgical History Past Surgical History:  Procedure Laterality Date   AMPUTATION TOE Right 09/14/2016   Procedure: RIGHT 2ND TOE/RAY AMPUTATION;  Surgeon: Leandrew Koyanagi, MD;  Location: [facility];  Service: Orthopedics;  Laterality: Right;   APPENDECTOMY  05/03/1947   COLECTOMY  05/02/1990   CYSTOSCOPY N/A 06/21/2015   Procedure: CYSTOSCOPY, CLOT EVACUATION, AND CAUTERIZATION OF PROSTATE FOSSA;  Surgeon: Carolan Clines, MD;  Location: [facility];  Service: Urology;   Laterality: N/A;   HEMORRHOID SURGERY     MOLE REMOVAL  2022   hand   TOE SURGERY  04/2020   TRANSURETHRAL RESECTION OF PROSTATE N/A 05/23/2015   Procedure: TRANSURETHRAL RESECTION OF THE PROSTATE (TURP);  Surgeon: Irine Seal, MD;  Location: [facility];  Service: Urology;  Laterality: N/A;    Family History Family History  Problem Relation Age of Onset   Colon cancer Father 51   Diabetes Father        type I   Alzheimer's disease Mother    Cancer Other        colon/fhx   Rectal cancer Maternal Uncle    Colon cancer Paternal Grandmother 41   Esophageal cancer Neg Hx    Stomach cancer Neg Hx     Social History Social History   Socioeconomic History   Marital status: Married    Spouse name: Not on file   Number of children: Not on file   Years of education: 18   Highest education level: Master's degree (e.g., MA, MS, MEng, MEd, MSW, MBA)  Occupational History   Occupation: Retired  Tobacco Use   Smoking status: Former    Packs/day: 0.25    Years: 10.00    Total pack years: 2.50    Types: Pipe, Cigarettes   Smokeless tobacco: Never  Vaping Use   Vaping Use: Never used  Substance and Sexual Activity   Alcohol use: Yes    Alcohol/week: 2.0 standard drinks of alcohol    Types: 2 Cans of beer per week    Comment: social   Drug use: No   Sexual activity: Not Currently  Other Topics Concern   Not on file  Social History Narrative   Married 1966. 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)      No religious beliefs affecting health care, no afterlife beliefs      Right Handed      Two Story Home   Social Determinants of Health   Financial Resource Strain: Low Risk  (05/04/2021)   Overall Financial Resource Strain (CARDIA)     Difficulty of Paying Living Expenses: Not hard at all  Food Insecurity: No Food Insecurity (05/04/2021)   Hunger Vital Sign    Worried About Running Out of Food in the Last Year: Never true    Ran Out of Food in the Last Year: Never true  Transportation Needs: No Transportation Needs (05/04/2021)   PRAPARE - Hydrologist (Medical): No    Lack of Transportation (Non-Medical): No  Physical Activity: Inactive (05/04/2021)   Exercise Vital Sign    Days of Exercise per Week: 0 days    Minutes of Exercise per Session: 0 min  Stress: No Stress Concern Present (05/04/2021)   [employer]  [employer] - Occupational Stress Questionnaire    Feeling of Stress : Not at all  Social Connections: Moderately Integrated (05/04/2021)   Social Connection and Isolation Panel [NHANES]    Frequency of Communication with Friends and Family: Twice a week    Frequency of Social Gatherings with Friends and Family: More than three times a week    Attends Religious Services: Never    Marine scientist or Organizations: Yes    Attends Archivist Meetings: 1 to 4 times per year    Marital Status: Married    Allergies Allergies  Allergen Reactions   Bactrim [Sulfamethoxazole-Trimethoprim] Nausea And Vomiting    Pt has chills and fevers also.    Outpatient Meds Home medications from the H+P and/or nursing med reconciliation reviewed.  Inpatient med list reviewed  _____________________________________________________________________ Objective   Exam:  Current vital signs  Patient Vitals for the past 8 hrs:  BP Temp Temp src Pulse Resp SpO2  01/26/22 1715 (!) 127/49 -- -- 91 13 100 %  01/26/22 1713 136/64 97.8 F (36.6 C) Oral (!) 141 16 98 %  01/26/22 1700 (!) 114/55 -- -- 88 18 100 %  01/26/22 1658 (!) 116/53 97.9 F (36.6 C) Oral 88 16 100 %  01/26/22 1645 (!) 105/51 -- -- 88 15 100 %  01/26/22 1630 (!) 109/51 -- -- 84 18 100 %  01/26/22  1615 (!) 118/45 -- -- 99 (!) 31 100 %  01/26/22 1600 (!) 100/51 -- -- 89 16 100 %  01/26/22 1545 (!) 116/52 -- -- 84 (!) 9 100 %  01/26/22 1522 115/65 97.7 F (36.5 C) Oral 85 15 100 %  01/26/22 1304 113/60 97.7 F (36.5 C) Oral 100 16 100 %   No intake or output data in the 24 hours ending 01/26/22 1734  Physical Exam:   General: this is a pale and otherwise well-appearing elderly male patient in no acute distress.  He is lying on a stretcher in his ED room, he is alert and conversational, oriented x3 Eyes: sclera anicteric, no redness ENT: oral mucosa moist without lesions, no cervical or supraclavicular lymphadenopathy, good dentition CV: RRR without murmur, S1/S2, no JVD,, no peripheral edema Resp: clear to auscultation bilaterally, normal RR and effort noted GI: soft, no tenderness, with active bowel sounds. No guarding or palpable organomegaly noted Skin; warm and dry, no rash or jaundice noted.  Normal capillary refill Neuro: awake, alert and oriented x 3. Normal gross motor function and fluent speech.  Labs:     Latest Ref Rng & Units 01/26/2022    1:48 PM 01/26/2022    1:33 PM 03/02/2021   10:09 AM  CBC  WBC 4.0 - 10.5 K/uL  21.4  9.4   Hemoglobin 13.0 - 17.0 g/dL 7.5  7.3  13.7   Hematocrit 39.0 - 52.0 % 22.0  21.6  41.2   Platelets 150 - 400 K/uL  275  224.0        Latest Ref Rng & Units 01/26/2022    1:48 PM 01/26/2022    1:33 PM 07/02/2021    8:28 AM  CMP  Glucose 70 - 99 mg/dL 267  272  169   BUN 8 - 23 mg/dL 45  44  16   Creatinine 0.61 - 1.24 mg/dL 0.80  1.12  0.98   Sodium 135 - 145 mmol/L 136  137  138   Potassium 3.5 - 5.1 mmol/L 4.3  4.4  4.2   Chloride  98 - 111 mmol/L 105  106  101   CO2 22 - 32 mmol/L  19  29   Calcium 8.9 - 10.3 mg/dL  9.0  9.6   Total Protein 6.5 - 8.1 g/dL  5.7  6.9   Total Bilirubin 0.3 - 1.2 mg/dL  0.5  0.7   Alkaline Phos 38 - 126 U/L  41  62   AST 15 - 41 U/L  19  17   ALT 0 - 44 U/L  19  25     Recent Labs  Lab  01/26/22 1333  INR 1.2   _________________________________________________________ Radiologic studies:  CLINICAL DATA:  Lower gastrointestinal bleeding.   EXAM: CTA ABDOMEN AND PELVIS WITHOUT AND WITH CONTRAST   TECHNIQUE: Multidetector CT imaging of the abdomen and pelvis was performed using the standard protocol during bolus administration of intravenous contrast. Multiplanar reconstructed images and MIPs were obtained and reviewed to evaluate the vascular anatomy.   RADIATION DOSE REDUCTION: This exam was performed according to the departmental dose-optimization program which includes automated exposure control, adjustment of the mA and/or kV according to patient size and/or use of iterative reconstruction technique.   CONTRAST:  61m OMNIPAQUE IOHEXOL 350 MG/ML SOLN   COMPARISON:  None Available.   FINDINGS: VASCULAR   Aorta: Atherosclerosis of abdominal aorta is noted without aneurysm or dissection.   Celiac: Patent without evidence of aneurysm, dissection, vasculitis or significant stenosis.   SMA: Patent without evidence of aneurysm, dissection, vasculitis or significant stenosis.   Renals: Both renal arteries are patent without evidence of aneurysm, dissection, vasculitis, fibromuscular dysplasia or significant stenosis.   IMA: Patent without evidence of aneurysm, dissection, vasculitis or significant stenosis.   Inflow: Patent without evidence of aneurysm, dissection, vasculitis or significant stenosis.   Proximal Outflow: Bilateral common femoral and visualized portions of the superficial and profunda femoral arteries are patent without evidence of aneurysm, dissection, vasculitis or significant stenosis.   Veins: No obvious venous abnormality within the limitations of this arterial phase study.   Review of the MIP images confirms the above findings.   NON-VASCULAR   Lower chest: 13 mm nodule is noted in left lower lobe.   Hepatobiliary: No  gallstones or biliary dilatation is noted. Probable hepatic steatosis.   Pancreas: No acute inflammation or ductal dilatation is noted. 3.8 cm cystic abnormality is noted in pancreatic head.   Spleen: Normal in size without focal abnormality.   Adrenals/Urinary Tract: Adrenal glands appear normal. No hydronephrosis or renal obstruction is noted. No renal or ureteral calculi are noted. Urinary bladder is unremarkable. Two right renal cysts are noted for which no further follow-up is required.   Stomach/Bowel: The stomach is unremarkable. However there appears to be contrast extravasation involving the first and second portions of the duodenum consistent with active gastrointestinal bleeding. There is no evidence of bowel obstruction or inflammation. Postsurgical changes are seen in the rectosigmoid region.   Lymphatic: No adenopathy is noted.   Reproductive: Mild prostatic enlargement is noted. Postsurgical defect is noted.   Other: No abdominal wall hernia or abnormality. No abdominopelvic ascites.   Musculoskeletal: No acute or significant osseous findings.   IMPRESSION: VASCULAR   There is contrast extravasation involving the first and second portions of the duodenum consistent with active gastrointestinal bleeding.   Aortic Atherosclerosis (ICD10-I70.0).   NON-VASCULAR   13 mm nodule is noted in left lower lobe. PET scan is recommended for further evaluation.   3.8 cm cystic abnormality is seen in pancreatic  head. 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 Indolent neoplasm, such as intraductal papillary mucinous tumor could look similar. Per consensus criteria, follow-up with preferably pre and post contrast abdominal MRI at 1 year is recommended. This recommendation follows ACR consensus guidelines: Managing Incidental Findings on Abdominal CT: White Paper of the ACR Incidental Findings Committee. J Am Coll Radiol 2010;7:754-773.   Probable hepatic steatosis.   Critical  Value/emergent results were called by telephone at the time of interpretation on 01/26/2022 at 3:35 pm to provider ABIGAIL HARRIS , who verbally acknowledged these results.     Electronically Signed   By: Marijo Conception M.D.   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  His GI bleeding is probably been going on for the last 2 days and he is now markedly anemic though fortunately hemodynamically stable.  Suspected peptic ulcer from aspirin and possible other NSAID use, also possibly AVM.  This pancreatic lesion does not have radiographic appearance of malignancy nor does it appear to be invading the duodenum based on the radiologist's description.   Plan:  He has 2 peripheral IVs, has received a bolus of Protonix and is now on an infusion.  His nurse had just started the first of 2 planned units of PRBC transfusion when I was seeing him.  His hospitalist Dr. Lorin Mercy had also arrived for evaluation and admission.  I advised Linus to have an upper endoscopy this evening for localization and treatment of the GI bleeding.  He was agreeable after discussion of procedure and risks.  The benefits and risks of the planned procedure were described in detail with the patient or (when appropriate) their health care proxy.  Risks were outlined as including, but not limited to, bleeding, infection, perforation, adverse medication reaction leading to cardiac or pulmonary decompensation, pancreatitis (if ERCP).  The limitation of incomplete mucosal visualization was also discussed.  No guarantees or warranties were given.  Patient at increased risk for cardiopulmonary  complications of procedure due to medical comorbidities.  (Age, severe anemia, sleep apnea)  Continue IV Protonix, and after his procedure we will arrange serial hemoglobin and hematocrit checks starting a few hours after the 2 units of PRBCs are transfused.  Outpatient follow-up with Dr. Fuller Plan for the incidental pancreatic lesion.  Thank you for the courtesy of this consult.  Please contact me with any questions or concerns.  Nelida Meuse III Office: [phone]

## 2022-01-26 NOTE — Transfer of Care (Signed)
Immediate Anesthesia Transfer of Care Note  Patient: Walter [name]  Procedure(s) Performed: ESOPHAGOGASTRODUODENOSCOPY (EGD) WITH PROPOFOL HEMOSTASIS CLIP PLACEMENT HOT HEMOSTASIS (ARGON PLASMA COAGULATION/BICAP) HEMOSTASIS CONTROL SCLEROTHERAPY  Patient Location: PACU  Anesthesia Type:MAC  Level of Consciousness: awake, alert  and oriented  Airway & Oxygen Therapy: Patient Spontanous Breathing and Patient connected to nasal cannula oxygen  Post-op Assessment: Report given to RN, Post -op Vital signs reviewed and stable and Patient moving all extremities  Post vital signs: Reviewed and stable  Last Vitals:  Vitals Value Taken Time  BP 139/68 01/26/22 1915  Temp    Pulse 88 01/26/22 1921  Resp 14 01/26/22 1921  SpO2 99 % 01/26/22 1921  Vitals shown include unvalidated device data.  Last Pain:  Vitals:   01/26/22 1814  TempSrc: Oral  PainSc:          Complications: No notable events documented.

## 2022-01-26 NOTE — ED Notes (Signed)
Consent obtained from pt to receive blood products & witnessed by this RN.

## 2022-01-26 NOTE — Op Note (Signed)
[facility] Patient Name: Walter [name] Procedure Date : 01/26/2022 MRN: [id number] Attending MD: Estill Cotta. Loletha Carrow , MD Date of Birth: 04/23/1941 CSN: [id number] Age: 81 Admit Type: Outpatient Procedure:                Upper GI endoscopy Indications:              Acute post hemorrhagic anemia, Melena Providers:                Mallie Mussel L. Loletha Carrow, MD, Jeanella Cara, RN,                            Fransico Setters Mbumina, Technician Referring MD:             [facility] Medicines:                Monitored Anesthesia Care Complications:            No immediate complications. Estimated Blood Loss:     difficult to quantify EBL from the bleeding ulcer. Procedure:                Pre-Anesthesia Assessment:                           - Prior to the procedure, a History and Physical                            was performed, and patient medications and                            allergies were reviewed. The patient's tolerance of                            previous anesthesia was also reviewed. The risks                            and benefits of the procedure and the sedation                            options and risks were discussed with the patient.                            All questions were answered, and informed consent                            was obtained. Prior Anticoagulants: The patient has                            taken no previous anticoagulant or antiplatelet                            agents except for aspirin. ASA Grade Assessment: IV                            - A patient with severe systemic disease that is a  constant threat to life. After reviewing the risks                            and benefits, the patient was deemed in                            satisfactory condition to undergo the procedure.                           After obtaining informed consent, the endoscope was                            passed under direct  vision. Throughout the                            procedure, the patient's blood pressure, pulse, and                            oxygen saturations were monitored continuously. The                            [id number]-H190 ([id number]) Olympus endoscope was introduced                            through the mouth, and advanced to the second part                            of duodenum. The upper GI endoscopy was performed                            with difficulty due to excessive bleeding. The                            patient tolerated the procedure well. Scope In: Scope Out: Findings:      A small hiatal hernia was present.      Fresh and clotted blood was found in the entire examined stomach. It was       seen emanating from the duodenum.      Red blood was found in the duodenal bulb.      One oozing cratered duodenal ulcer with a visible vessel (pulsatile       oozing) was found in the duodenal bulb (superior/posterior aspect). The       lesion was apporximately 10 mm in largest dimension. Area was       successfully injected with 3 mL of a 1:100,000 solution of epinephrine       for hemostasis. Bleeding persisted, so coagulation for hemostasis using       bipolar probe was employed. Bleeding persisted, so one hemostatic clip       was successfully placed (MR conditional). Bleeding significantly slowed       afterward, but there was still scant intermittent oozing from one aspect       of the site. To stop active bleeding, hemostatic spray was deployed. Two       sprays were applied. There was no bleeding at the end of the procedure.  The remainder of the examined duodenum was normal except for fresh blood       that was lavaged to reveal no additional bleeding sources. Impression:               - Small hiatal hernia.                           - Clotted blood in the entire stomach.                           - Blood in the duodenal bulb.                           - Oozing duodenal ulcer  with a visible vessel.                            Injected. Treated with bipolar cautery. Clip (MR                            conditional) was placed. hemostatic spray applied.                           - No specimens collected.                           He will continue to pass melena at least overnight                            due residual blood in the UGI tract, Recommendation:           - Return patient to hospital ward for ongoing care.                           - NPO.                           - Q 6 hour Hgb and Hct beginning 2 hours after                            completing the current (second) unit of blood.                           Continue Protonix drip at 8 mg/hr                           Keep head of bed elevated to reduce the risk of                            aspiration.                           Metoclopramide 5 mg IV Q 6 hrs x 3                           - No CPAP tonight due to risk of aspiration  Call overnight GI physician if needed.                           - The findings and recommendations were discussed                            with the patient's family. (wife , by phone) Procedure Code(s):        --- Professional ---                           [phone], Esophagogastroduodenoscopy, flexible,                            transoral; with control of bleeding, any method Diagnosis Code(s):        --- Professional ---                           K44.9, Diaphragmatic hernia without obstruction or                            gangrene                           K92.2, Gastrointestinal hemorrhage, unspecified                           K26.4, Chronic or unspecified duodenal ulcer with                            hemorrhage                           D62, Acute posthemorrhagic anemia                           K92.1, Melena (includes Hematochezia) CPT copyright 2019 American Medical Association. All rights reserved. The codes documented in this report are  preliminary and upon coder review may  be revised to meet current compliance requirements. Violia Knopf L. Loletha Carrow, MD 01/26/2022 7:19:55 PM This report has been signed electronically. Number of Addenda: 0

## 2022-01-26 NOTE — Anesthesia Procedure Notes (Signed)
Procedure Name: MAC Date/Time: 01/26/2022 6:21 PM  Performed by: Barrington Ellison, CRNAPre-anesthesia Checklist: Patient identified, Suction available, Emergency Drugs available, Patient being monitored and Timeout performed Patient Re-evaluated:Patient Re-evaluated prior to induction Oxygen Delivery Method: Nasal cannula

## 2022-01-26 NOTE — Progress Notes (Signed)
Pt set up on CPAP auto titrate for night rest.  Tolerating well at this time.

## 2022-01-26 NOTE — H&P (Signed)
History and Physical    Patient: Walter [name] ZOX:[id number] DOB: 06/21/1940 DOA: 01/26/2022 DOS: the patient was seen and examined on 01/26/2022 PCP: [name], MD  Patient coming from: Home - lives with wife; NOK: Wife, Walter [name], [phone]   Chief Complaint: Tarry stools  HPI: Walter [name] is a 81 y.o. male with medical history significant of BPH; DM; HTN; HLD; and OSA on CPAP presenting with tarry stools.   He reports that he started noticing tarry stools on Monday (9/25).  He has become light-headed and SOB with ambulation.  No CP.  No h/o GI bleeds.  He used to be able to eat extremely spicy foods but in the last few months has had severe indigestion with this.  He also started taking ASA 81 mg and an aspirin-containing medication for pain recently.  His last colonoscopy was in 10/2019 and showed a few small polyps, healthy-appearing mucosa from prior anastomosis (cancer in 1993), and internal hemorrhoids.    ER Course:  Light-headed and melena x 2 days.  Hgb 7.3, hemodynamically stable.  CTA + in duodenum.  Starting Protonix, giving blood.  GI to see, possible EGD today.  Needs SDU admission given active bleeding.     Review of Systems: As mentioned in the history of present illness. All other systems reviewed and are negative. Past Medical History:  Diagnosis Date   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   S/p 2nd ray amputation 2018- started after prolonged use of corn pad   Allergic rhinitis 08/19/2019   flonase   BPH (benign prostatic hyperplasia) 07/02/2015   S/p TURP with multiple complications afterwards including recurrent hospitalizations. All started after a hemorrhoid surgery and later with urinary retention. Patient at one point was septic from urinary issues.     Diabetes mellitus type II, controlled 04/23/2007    Metformin 1g BID, amaryl '4mg'$ --> '8mg'$ , had to add [place] back  Never took victoza.   Januvia-lethargic and dizzy in past. 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   History of colon cancer    Tubular adenoma of colon; s/p colectomy 1992    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   History of shingles    Hyperlipidemia associated with type 2 diabetes mellitus 04/23/2007   Atorvastatin '20mg'$  once weekly     Hypertension associated with diabetes 04/23/2007   Amlodipine '5mg'$ , telmisartan '40mg'$      Iritis 12/17/2008   Shingles 2010    Mild neurocognitive disorder 12/23/2019   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   Obstructive sleep apnea 04/23/2007   uses CPAP nightly   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   Sepsis secondary to UTI 03/29/2015   Past Surgical History:  Procedure Laterality Date   AMPUTATION TOE Right 09/14/2016   Procedure: RIGHT 2ND TOE/RAY AMPUTATION;  Surgeon: Leandrew Koyanagi, MD;  Location: [facility];  Service: Orthopedics;  Laterality: Right;   APPENDECTOMY  05/03/1947   COLECTOMY  05/02/1990   CYSTOSCOPY N/A 06/21/2015   Procedure: CYSTOSCOPY, CLOT EVACUATION, AND CAUTERIZATION OF PROSTATE FOSSA;  Surgeon: Carolan Clines, MD;  Location: [facility];  Service: Urology;  Laterality: N/A;   HEMORRHOID SURGERY     MOLE REMOVAL  2022   hand   TOE SURGERY  04/2020   TRANSURETHRAL RESECTION OF PROSTATE N/A 05/23/2015   Procedure: TRANSURETHRAL RESECTION OF THE PROSTATE (TURP);  Surgeon: Irine Seal, MD;  Location: [facility];  Service: Urology;  Laterality: N/A;   Social History:  reports that he has quit smoking. His smoking use included pipe and cigarettes. He has a 2.50 pack-year smoking history. He has never used smokeless tobacco. He reports current alcohol use of about 2.0 standard drinks of alcohol per week. He reports that he does not use drugs.  Allergies  Allergen Reactions   Bactrim [Sulfamethoxazole-Trimethoprim] Nausea And Vomiting    Pt has  chills and fevers also.    Family History  Problem Relation Age of Onset   Colon cancer Father 21   Diabetes Father        type I   Alzheimer's disease Mother    Cancer Other        colon/fhx   Rectal cancer Maternal Uncle    Colon cancer Paternal Grandmother 44   Esophageal cancer Neg Hx    Stomach cancer Neg Hx     Prior to Admission medications   Medication Sig Start Date End Date Taking? Authorizing Provider  amLODipine (NORVASC) 5 MG tablet TAKE 1 TABLET BY MOUTH EVERY DAY 12/28/21   [name], MD  donepezil (ARICEPT) 10 MG tablet TAKE 1 TABLET BY MOUTH EVERYDAY AT BEDTIME 12/28/21   Cameron Sprang, MD  fluticasone [facility]) 50 MCG/ACT nasal spray SPRAY 2 SPRAYS INTO EACH NOSTRIL EVERY DAY 03/02/21   [name], MD  glipiZIDE (GLUCOTROL) 10 MG tablet TAKE 1 TABLET (10 MG TOTAL) BY MOUTH 2 (TWO) TIMES DAILY BEFORE A MEAL. 06/22/21   [name], MD  JANUVIA 100 MG tablet TAKE 1 TABLET BY MOUTH EVERY DAY 12/28/21   [name], MD  metFORMIN (GLUCOPHAGE) 1000 MG tablet TAKE 1 TABLET BY MOUTH 2 TIMES DAILY WITH A MEAL. 12/28/21   [name], MD  Multiple Vitamins-Minerals (CENTRUM SILVER PO) Take 1 tablet by mouth daily.     [provider]  [facility] ULTRA test strip USE ONCE DAILY AS INSTRUCTED 11/08/21   [name], MD  rosuvastatin (CRESTOR) 20 MG tablet TAKE 1 TABLET BY MOUTH ONE TIME PER WEEK 08/02/21   [name], MD  telmisartan (MICARDIS) 40 MG tablet TAKE 1 TABLET BY MOUTH EVERY DAY 03/08/21   [name], MD    Physical Exam: Vitals:   01/26/22 1658 01/26/22 1700 01/26/22 1713 01/26/22 1715  BP: (!) 116/53 (!) 114/55 136/64 (!) 127/49  Pulse: 88 88 (!) 141 91  Resp: '16 18 16 13  '$ Temp: 97.9 F (36.6 C)  97.8 F (36.6 C)   TempSrc: Oral  Oral   SpO2: 100% 100% 98% 100%   General:  Appears calm and comfortable and is in NAD Eyes:  PERRL, EOMI, normal lids, iris ENT:  grossly normal hearing, lips & tongue, mmm;  appropriate dentition Neck:  no LAD, masses or thyromegaly Cardiovascular:  RRR, no m/r/g. No LE edema.  Respiratory:   CTA bilaterally with no wheezes/rales/rhonchi.  Normal respiratory effort. Abdomen:  soft, NT, ND Skin:  no rash or induration seen on limited exam Musculoskeletal:  grossly normal tone BUE/BLE, good ROM, no bony abnormality Psychiatric:  grossly normal mood and affect, speech fluent and appropriate, AOx3 Neurologic:  CN 2-12 grossly intact, moves all extremities in coordinated fashion   Radiological Exams on Admission: Independently reviewed - see discussion in A/P where applicable  CT ANGIO GI BLEED  Result Date: 01/26/2022 CLINICAL DATA:  Lower gastrointestinal bleeding. EXAM: CTA ABDOMEN AND  PELVIS WITHOUT AND WITH CONTRAST TECHNIQUE: Multidetector CT imaging of the abdomen and pelvis was performed using the standard protocol during bolus administration of intravenous contrast. Multiplanar reconstructed images and MIPs were obtained and reviewed to evaluate the vascular anatomy. RADIATION DOSE REDUCTION: This exam was performed according to the departmental dose-optimization program which includes automated exposure control, adjustment of the mA and/or kV according to patient size and/or use of iterative reconstruction technique. CONTRAST:  42m OMNIPAQUE IOHEXOL 350 MG/ML SOLN COMPARISON:  None Available. FINDINGS: VASCULAR Aorta: Atherosclerosis of abdominal aorta is noted without aneurysm or dissection. Celiac: Patent without evidence of aneurysm, dissection, vasculitis or significant stenosis. SMA: Patent without evidence of aneurysm, dissection, vasculitis or significant stenosis. Renals: Both renal arteries are patent without evidence of aneurysm, dissection, vasculitis, fibromuscular dysplasia or significant stenosis. IMA: Patent without evidence of aneurysm, dissection, vasculitis or significant stenosis. Inflow: Patent without evidence of aneurysm, dissection,  vasculitis or significant stenosis. Proximal Outflow: Bilateral common femoral and visualized portions of the superficial and profunda femoral arteries are patent without evidence of aneurysm, dissection, vasculitis or significant stenosis. Veins: No obvious venous abnormality within the limitations of this arterial phase study. Review of the MIP images confirms the above findings. NON-VASCULAR Lower chest: 13 mm nodule is noted in left lower lobe. Hepatobiliary: No gallstones or biliary dilatation is noted. Probable hepatic steatosis. Pancreas: No acute inflammation or ductal dilatation is noted. 3.8 cm cystic abnormality is noted in pancreatic head. Spleen: Normal in size without focal abnormality. Adrenals/Urinary Tract: Adrenal glands appear normal. No hydronephrosis or renal obstruction is noted. No renal or ureteral calculi are noted. Urinary bladder is unremarkable. Two right renal cysts are noted for which no further follow-up is required. Stomach/Bowel: The stomach is unremarkable. However there appears to be contrast extravasation involving the first and second portions of the duodenum consistent with active gastrointestinal bleeding. There is no evidence of bowel obstruction or inflammation. Postsurgical changes are seen in the rectosigmoid region. Lymphatic: No adenopathy is noted. Reproductive: Mild prostatic enlargement is noted. Postsurgical defect is noted. Other: No abdominal wall hernia or abnormality. No abdominopelvic ascites. Musculoskeletal: No acute or significant osseous findings. IMPRESSION: VASCULAR There is contrast extravasation involving the first and second portions of the duodenum consistent with active gastrointestinal bleeding. Aortic Atherosclerosis (ICD10-I70.0). NON-VASCULAR 13 mm nodule is noted in left lower lobe. PET scan is recommended for further evaluation. 3.8 cm cystic abnormality is seen in pancreatic head. 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 Indolent neoplasm, such as intraductal  papillary mucinous tumor could look similar. Per consensus criteria, follow-up with preferably pre and post contrast abdominal MRI at 1 year is recommended. This recommendation follows ACR consensus guidelines: Managing Incidental Findings on Abdominal CT: White Paper of the ACR Incidental Findings Committee. J Am Coll Radiol 2010;7:754-773. Probable hepatic steatosis. Critical Value/emergent results were called by telephone at the time of interpretation on 01/26/2022 at 3:35 pm to provider ABIGAIL HARRIS , who verbally acknowledged these results. Electronically Signed   By: JMarijo ConceptionM.D.   On: 01/26/2022 15:36    EKG: Independently reviewed.  NSR with rate 85; nonspecific ST changes with no evidence of acute ischemia   Labs on Admission: I have personally reviewed the available labs and imaging studies at the time of the admission.  Pertinent labs:    Glucose 272 BUN 44 Lactate 6 WBC 21.4 Hgb 7.3; 13.7 on 03/02/21 A1c 7.2 on 7/7 INR 1.2   Assessment and Plan: Principal Problem:   Acute upper GI bleeding  Active Problems:   Diabetes mellitus type II, controlled   Hyperlipidemia associated with type 2 diabetes mellitus   Obstructive sleep apnea   Essential hypertension   Mild neurocognitive disorder   ABLA (acute blood loss anemia)    Upper GI bleeding -Patient is presenting with melena and light-headedness, suggestive of upper GI bleeding. -Hgb 7.3, down from 13.7 -CTA was performed and showed contrast extravasation of the 1st and 2nd portions of the duodenum c/w active GI bleeding -Most likely diagnosis is duodenal ulcer -The patient is not tachycardic with normal blood pressure, suggesting subacute volume loss.  -Will admit to progressive bed  -GI consulted by ED -NPO for urgent EGD -LR at 100 mL/hr -Start IV pantoprazole bolus and infusion given frank bleeding with concern for hemodynamic instability -Zofran IV for nausea -Avoid NSAIDs and SQ heparin -Maintain IV  access (2 large bore IVs if possible). -Hold ASA for now  ABLA -Patient's lightheadedness and fatigue are most likely caused by anemia secondary to upper GI bleeding.  -His Hgb decreased from 13.7 to 7.3.  -Type and screen were done in ED.  -Two units of blood were ordered by ED in the setting of +CTA.  -Monitor closely and follow cbc q12h, transfuse as necessary for Hbg <7.  DM -Recent A1c was 7.2, reasonable control -hold Glucophage, glipizide, Januvia -Cover with moderate-scale SSI   HTN -Continue amlodipine, telmisartan (formulary substitution for irbesartan)  HLD -Continue rosuvastatin  OSA -Continue CPAP  MCI -Continue Aricept  Lung nodule -13 mm, LLL -Suggested to have outpatient PET scan  Pancreatic head mass -Imaging suggests a pseudocyst but neoplasm is a consideration -Suggested to have f/u MRI in 1 year -PET as above may also be helpful   Advance Care Planning:   Code Status: Full Code   Consults: GI  DVT Prophylaxis: SCDs  Family Communication: Wife was present throughout evaluation  Severity of Illness: The appropriate patient status for this patient is INPATIENT. Inpatient status is judged to be reasonable and necessary in order to provide the required intensity of service to ensure the patient's safety. The patient's presenting symptoms, physical exam findings, and initial radiographic and laboratory data in the context of their chronic comorbidities is felt to place them at high risk for further clinical deterioration. Furthermore, it is not anticipated that the patient will be medically stable for discharge from the hospital within 2 midnights of admission.   * I certify that at the point of admission it is my clinical judgment that the patient will require inpatient hospital care spanning beyond 2 midnights from the point of admission due to high intensity of service, high risk for further deterioration and high frequency of surveillance  required.*  Author: Karmen Bongo, MD 01/26/2022 5:42 PM  For on call review [email or web address].

## 2022-01-26 NOTE — ED Provider Notes (Signed)
Went to evaluate patient but he was not in room.  Patient was sent back for formal triage.  I did not participate in patient care.   Fransico Meadow, MD 01/26/22 [phone]

## 2022-01-26 NOTE — Interval H&P Note (Signed)
History and Physical Interval Note:  01/26/2022 6:16 PM  Walter [name]  has presented today for surgery, with the diagnosis of Upper GI bleeding.  The various methods of treatment have been discussed with the patient and family. After consideration of risks, benefits and other options for treatment, the patient has consented to  Procedure(s): ESOPHAGOGASTRODUODENOSCOPY (EGD) WITH PROPOFOL (N/A) as a surgical intervention.  The patient's history has been reviewed, patient examined, no change in status, stable for surgery.  I have reviewed the patient's chart and labs.  Questions were answered to the patient's satisfaction.    First unit of PRBCs complete and patient remains hemodynamically stable for EGD.  Nelida Meuse III

## 2022-01-26 NOTE — ED Provider Triage Note (Signed)
Emergency Medicine Provider Triage Evaluation Note  Mihcael Ledee , a 81 y.o. male  was evaluated in triage.  Pt complains of 4 days of black stools.  Also having abdominal pain.  Patient did have vomiting the first day.  He states he feels extremely lightheaded like he is going to pass out every time he stands.  No previous history of GI bleeds.  No previous history of blood transfusions.  Not on any blood thinners..  Review of Systems  Positive: Abdominal pain Negative: fever  Physical Exam  There were no vitals taken for this visit. Gen:   Awake, no distress   Resp:  Normal effort  MSK:   Moves extremities without difficulty  Other:  Abdominal pain  Medical Decision Making  Medically screening exam initiated at 1:04 PM.  Appropriate orders placed.  Ahamed Hofland was informed that the remainder of the evaluation will be completed by another provider, this initial triage assessment does not replace that evaluation, and the importance of remaining in the ED until their evaluation is complete.  Work-up initiated   Margarita Mail, PA-C 01/26/22 1306

## 2022-01-26 NOTE — ED Triage Notes (Signed)
Patient BIB [facility] from home with complaint of dizziness, numbness over his entire body, and black tarry stools since Monday this week. Worse when standing. Denies taking any blood thinners. Patient is alert, oriented, and in no apparent distress.

## 2022-01-27 ENCOUNTER — Other Ambulatory Visit: Payer: Self-pay

## 2022-01-27 DIAGNOSIS — D62 Acute posthemorrhagic anemia: Secondary | ICD-10-CM

## 2022-01-27 DIAGNOSIS — E119 Type 2 diabetes mellitus without complications: Secondary | ICD-10-CM

## 2022-01-27 DIAGNOSIS — K264 Chronic or unspecified duodenal ulcer with hemorrhage: Secondary | ICD-10-CM | POA: Diagnosis not present

## 2022-01-27 DIAGNOSIS — E1169 Type 2 diabetes mellitus with other specified complication: Secondary | ICD-10-CM

## 2022-01-27 DIAGNOSIS — K921 Melena: Secondary | ICD-10-CM

## 2022-01-27 DIAGNOSIS — I10 Essential (primary) hypertension: Secondary | ICD-10-CM

## 2022-01-27 DIAGNOSIS — E785 Hyperlipidemia, unspecified: Secondary | ICD-10-CM

## 2022-01-27 DIAGNOSIS — K922 Gastrointestinal hemorrhage, unspecified: Secondary | ICD-10-CM | POA: Diagnosis not present

## 2022-01-27 LAB — CBC
HCT: 19 % — ABNORMAL LOW (ref 39.0–52.0)
Hemoglobin: 6.5 g/dL — CL (ref 13.0–17.0)
MCH: 30.8 pg (ref 26.0–34.0)
MCHC: 34.2 g/dL (ref 30.0–36.0)
MCV: 90 fL (ref 80.0–100.0)
Platelets: 159 10*3/uL (ref 150–400)
RBC: 2.11 MIL/uL — ABNORMAL LOW (ref 4.22–5.81)
RDW: 16 % — ABNORMAL HIGH (ref 11.5–15.5)
WBC: 11.2 10*3/uL — ABNORMAL HIGH (ref 4.0–10.5)
nRBC: 0 % (ref 0.0–0.2)

## 2022-01-27 LAB — GLUCOSE, CAPILLARY
Glucose-Capillary: 215 mg/dL — ABNORMAL HIGH (ref 70–99)
Glucose-Capillary: 229 mg/dL — ABNORMAL HIGH (ref 70–99)
Glucose-Capillary: 220 mg/dL — ABNORMAL HIGH (ref 70–99)
Glucose-Capillary: 118 mg/dL — ABNORMAL HIGH (ref 70–99)
Glucose-Capillary: 164 mg/dL — ABNORMAL HIGH (ref 70–99)

## 2022-01-27 LAB — PREPARE RBC (CROSSMATCH)

## 2022-01-27 LAB — HEMOGLOBIN AND HEMATOCRIT, BLOOD
HCT: 23.6 % — ABNORMAL LOW (ref 39.0–52.0)
Hemoglobin: 8.2 g/dL — ABNORMAL LOW (ref 13.0–17.0)

## 2022-01-27 MED ORDER — SODIUM CHLORIDE 0.9% IV SOLUTION: Freq: Once | INTRAVENOUS | Status: AC

## 2022-01-27 NOTE — Progress Notes (Signed)
Mobility Specialist Progress Note    01/27/22 1235  Mobility  Activity Ambulated with assistance in hallway  Level of Assistance Standby assist, set-up cues, supervision of patient - no hands on  Assistive Device Other (Comment) (IV pole)  Distance Ambulated (ft) 470 ft  Activity Response Tolerated well  $Mobility charge 1 Mobility   Pre-Mobility: 89 HR, 100% SpO2 Post-Mobility: 94 HR  Pt received in bed and agreeable. No complaints on walk. Returned to BR to attempt BM. Encouraged to pull string for assistance. RN aware.    Hildred Alamin Mobility Specialist

## 2022-01-27 NOTE — Progress Notes (Addendum)
Patient ID: Walter [name], male   DOB: 1941-03-09, 81 y.o.   MRN: [id number]    Progress Note   Subjective   Day # 2  CC; acute GI bleed  Protonix infusion  EGD last p.m.-actively bleeding cratered duodenal ulcer with visible vessel-treated with epinephrine, BiCap, Endo clipping and Hemospray-with no active bleeding at the end of the procedure.  Hemoglobin 7.3 on admission> 7 last p.m. post 2 units of blood> 6.5 this a.m.-one unit ordered and infusing  Hemodynamically stable overnight, did have 1 black tarry bowel movement about 4 AM, feels okay this morning denies shortness of breath or lightheadedness abdominal pain   Objective   Vital signs in last 24 hours: Temp:  [97.7 F (36.5 C)-98.6 F (37 C)] 98.2 F (36.8 C) (09/28 0837) Pulse Rate:  [79-141] 79 (09/28 0837) Resp:  [9-31] 15 (09/28 0837) BP: (100-139)/(45-68) 118/51 (09/28 0837) SpO2:  [96 %-100 %] 96 % (09/28 0837) Weight:  [87.1 kg] 87.1 kg (09/27 1804)   General:    Elderly white male in NAD Heart:  Regular rate and rhythm; no murmurs Lungs: Respirations even and unlabored, lungs CTA bilaterally Abdomen:  Soft, nontender and nondistended. Normal bowel sounds. Extremities:  Without edema. Neurologic:  Alert and oriented,  grossly normal neurologically. Psych:  Cooperative. Normal mood and affect.  Intake/Output from previous day: 09/27 0701 - 09/28 0700 In: 2906 [I.V.:2254.8; Blood:651.3] Out: 1450 [Urine:1450] Intake/Output this shift: No intake/output data recorded.  Lab Results: Recent Labs    01/26/22 1333 01/26/22 1348 01/26/22 2218 01/27/22 0623  WBC 21.4*  --  11.5* 11.2*  HGB 7.3* 7.5* 7.0* 6.5*  HCT 21.6* 22.0* 21.1* 19.0*  PLT 275  --  170 159   BMET Recent Labs    01/26/22 1333 01/26/22 1348  NA 137 136  K 4.4 4.3  CL 106 105  CO2 19*  --   GLUCOSE 272* 267*  BUN 44* 45*  CREATININE 1.12 0.80  CALCIUM 9.0  --    LFT Recent Labs    01/26/22 1333  PROT 5.7*  ALBUMIN 3.4*  AST  19  ALT 19  ALKPHOS 41  BILITOT 0.5   PT/INR Recent Labs    01/26/22 1333  LABPROT 15.0  INR 1.2    Studies/Results: CT ANGIO GI BLEED  Result Date: 01/26/2022 CLINICAL DATA:  Lower gastrointestinal bleeding. EXAM: CTA ABDOMEN AND PELVIS WITHOUT AND WITH CONTRAST TECHNIQUE: Multidetector CT imaging of the abdomen and pelvis was performed using the standard protocol during bolus administration of intravenous contrast. Multiplanar reconstructed images and MIPs were obtained and reviewed to evaluate the vascular anatomy. RADIATION DOSE REDUCTION: This exam was performed according to the departmental dose-optimization program which includes automated exposure control, adjustment of the mA and/or kV according to patient size and/or use of iterative reconstruction technique. CONTRAST:  60m OMNIPAQUE IOHEXOL 350 MG/ML SOLN COMPARISON:  None Available. FINDINGS: VASCULAR Aorta: Atherosclerosis of abdominal aorta is noted without aneurysm or dissection. Celiac: Patent without evidence of aneurysm, dissection, vasculitis or significant stenosis. SMA: Patent without evidence of aneurysm, dissection, vasculitis or significant stenosis. Renals: Both renal arteries are patent without evidence of aneurysm, dissection, vasculitis, fibromuscular dysplasia or significant stenosis. IMA: Patent without evidence of aneurysm, dissection, vasculitis or significant stenosis. Inflow: Patent without evidence of aneurysm, dissection, vasculitis or significant stenosis. Proximal Outflow: Bilateral common femoral and visualized portions of the superficial and profunda femoral arteries are patent without evidence of aneurysm, dissection, vasculitis or significant stenosis. Veins: No obvious venous abnormality  within the limitations of this arterial phase study. Review of the MIP images confirms the above findings. NON-VASCULAR Lower chest: 13 mm nodule is noted in left lower lobe. Hepatobiliary: No gallstones or biliary  dilatation is noted. Probable hepatic steatosis. Pancreas: No acute inflammation or ductal dilatation is noted. 3.8 cm cystic abnormality is noted in pancreatic head. Spleen: Normal in size without focal abnormality. Adrenals/Urinary Tract: Adrenal glands appear normal. No hydronephrosis or renal obstruction is noted. No renal or ureteral calculi are noted. Urinary bladder is unremarkable. Two right renal cysts are noted for which no further follow-up is required. Stomach/Bowel: The stomach is unremarkable. However there appears to be contrast extravasation involving the first and second portions of the duodenum consistent with active gastrointestinal bleeding. There is no evidence of bowel obstruction or inflammation. Postsurgical changes are seen in the rectosigmoid region. Lymphatic: No adenopathy is noted. Reproductive: Mild prostatic enlargement is noted. Postsurgical defect is noted. Other: No abdominal wall hernia or abnormality. No abdominopelvic ascites. Musculoskeletal: No acute or significant osseous findings. IMPRESSION: VASCULAR There is contrast extravasation involving the first and second portions of the duodenum consistent with active gastrointestinal bleeding. Aortic Atherosclerosis (ICD10-I70.0). NON-VASCULAR 13 mm nodule is noted in left lower lobe. PET scan is recommended for further evaluation. 3.8 cm cystic abnormality is seen in pancreatic head. 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 Indolent neoplasm, such as intraductal papillary mucinous tumor could look similar. Per consensus criteria, follow-up with preferably pre and post contrast abdominal MRI at 1 year is recommended. This recommendation follows ACR consensus guidelines: Managing Incidental Findings on Abdominal CT: White Paper of the ACR Incidental Findings Committee. J Am Coll Radiol 2010;7:754-773. Probable hepatic steatosis. Critical Value/emergent results were called by telephone at the time of interpretation on 01/26/2022 at 3:35 pm to  provider ABIGAIL HARRIS , who verbally acknowledged these results. Electronically Signed   By: Marijo Conception M.D.   On: 01/26/2022 15:36       Assessment / Plan:    #86 81 year old white male with acute upper GI bleed, with associated weakness and lightheadedness. Hemoglobin in 7 range on admission Found to have an actively bleeding cratered duodenal ulcer on EGD last p.m. treated endoscopically with multiple therapies with control of bleeding.  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 after midnight in the event he may need repeat EGD  We will add 1 more unit of packed RBCs for total of 2 this morning, continue serial hemoglobins and transfuse to keep his hemoglobin above 8    Principal Problem:   Acute upper GI bleeding Active Problems:   Diabetes mellitus type II, controlled   Hyperlipidemia associated with type 2 diabetes mellitus   Obstructive sleep apnea   Essential hypertension   Mild neurocognitive disorder   ABLA (acute blood loss anemia)   Duodenal ulcer hemorrhage   Melena     LOS: 1 day   Amy EsterwoodPA-C  01/27/2022, 9:06 AM  I have taken an interval history, thoroughly reviewed the chart and examined the patient. I agree with the  Advanced Practitioner's note, impression and recommendations, and have recorded additional findings, impressions and recommendations below. I performed a substantive portion of this encounter (>50% time spent), including a complete performance of the  medical decision making.  My additional thoughts are as follows:  He looks well today, reportedly had a large episode of melena overnight (not surprising from residual blood in the gut), and no further bleeding today.  Vital signs remain normal.  He had a marked decrease in hemoglobin overnight, though that was soon after his endoscopy with control of bleeding [place] PRBC transfusion.  He was clearly continuing to bleed throughout the time in the ER and the initial hemoglobin to the end of the endoscopic procedure. Thus, I think he is probably not rebleeding at this point, but it is difficult to know for sure until another night of observation and further hemoglobin checks.  He received another unit of PRBCs, and we ordered a second 1 for the hemoglobin of 6.5.  If we are uncertain on the issue of possible rebleeding, he may need a repeat endoscopy tomorrow.  He understood and was agreeable to that plan.   Nelida Meuse III Office:[phone]

## 2022-01-27 NOTE — Progress Notes (Signed)
Mobility Specialist Progress Note:   01/27/22 0939  Mobility  Activity Ambulated with assistance in hallway  Level of Assistance Contact guard assist, steadying assist  Assistive Device None  Distance Ambulated (ft) 500 ft  Activity Response Tolerated well  $Mobility charge 1 Mobility   Pt received in bed willing to participate in mobility. No complaints of pain. Left in bed with call bell in reach and all needs met.   [facility] Surveyor, mining Chat only

## 2022-01-27 NOTE — Progress Notes (Signed)
Visit made to patients room to assist with CPAP.  Patient don't want to wear at this time.  It keeps him awake and uncomfortable.  Patient advised he can have wife bring his from home.

## 2022-01-27 NOTE — Progress Notes (Signed)
PROGRESS NOTE    Walter [name]  RJJ:[id number] DOB: August 24, 1940 DOA: 01/26/2022 PCP: [name], MD   Brief Narrative: Walter [name] is a 81 y.o. male with a history of BPH, diabetes mellitus, hypertension, hyperlipidemia, OSA on CPAP. Patient presented secondary to black tarry stools with associated symptomatic anemia and evidence for upper GI bleeding. GI consulted and performed urgent upper endoscopy on 9/27 revealing a bleeding duodenal ulcer, which was treated.    Assessment and Plan:  Upper GI bleeding Duodenal ulcer Patient presented with melena and symptomatic anemia concerning for upper GI bleeding. GI was consulted and performed urgent upper endoscopy on 9/27 which was significant for clotted blood in stomach and an oozing duodenal ulcer with blood in the duodenal bulb; this was treated with cautery, clip was placed and hemostatic spray applied. -GI recommendations: serial hemoglobin, Protonix, full liquid diet and NPO after midnight  Acute blood loss anemia Secondary to above. Hemoglobin of 6.5 on admission. Patient transfused 2 units at that time with post-transfusion hemoglobin of 7.3. Hemoglobin of 6.5 this morning. -1 unit of PRBC ordered with 1 more unit ordered by GI to complete 2 units -Hemoglobin goal of  >8 per GI recommendations Serial CBC  Primary hypertension -Continue amlodipine and irbesartan (substituted for irbesartan)  Diabetes mellitus, type 2 Controlled for patient's age with most recent hemoglobin A1C of 7.2%. -Continue SSI  Hyperlipidemia -Continue Crestor  OSA -Continue CPAP  Mild cognitive impairment -Continue Aricept  DVT prophylaxis: SCDs Code Status:   Code Status: Full Code Family Communication: None at bedside Disposition Plan: Discharge home in 2-3 days pending stable hemoglobin and GI recommendations for discharge.   Consultants:  [facility]  Procedures:  9/27: Upper endoscopy  Antimicrobials: None     Subjective: Patient reports poor sleep overnight. Hungry and thirsty. Had a black tarry stool this morning. No other concerns today.  Objective: BP (!) 105/54 (BP Location: Left Arm)   Pulse 82   Temp 98.4 F (36.9 C) (Oral)   Resp 18   Ht 6' (1.829 m)   Wt 87.1 kg   SpO2 96%   BMI 26.04 kg/m   Examination:  General exam: Appears calm and comfortable Respiratory system: Clear to auscultation. Respiratory effort normal. Cardiovascular system: S1 & S2 heard, RRR. Systolic murmur. Gastrointestinal system: Abdomen is nondistended, soft and nontender. No organomegaly or masses felt. Normal bowel sounds heard. Central nervous system: Alert and oriented. No focal neurological deficits. Musculoskeletal: No edema. No calf tenderness Skin: No cyanosis. No rashes Psychiatry: Judgement and insight appear normal. Mood & affect appropriate.    Data Reviewed: I have personally reviewed following labs and imaging studies  CBC Lab Results  Component Value Date   WBC 11.2 (H) 01/27/2022   RBC 2.11 (L) 01/27/2022   HGB 6.5 (LL) 01/27/2022   HCT 19.0 (L) 01/27/2022   MCV 90.0 01/27/2022   MCH 30.8 01/27/2022   PLT 159 01/27/2022   MCHC 34.2 01/27/2022   RDW 16.0 (H) 01/27/2022   LYMPHSABS 1.2 03/02/2021   MONOABS 0.7 03/02/2021   EOSABS 0.2 03/02/2021   BASOSABS 0.0 01/60/1093     Last metabolic panel Lab Results  Component Value Date   NA 136 01/26/2022   K 4.3 01/26/2022   CL 105 01/26/2022   CO2 19 (L) 01/26/2022   BUN 45 (H) 01/26/2022   CREATININE 0.80 01/26/2022   GLUCOSE 267 (H) 01/26/2022   GFRNONAA >60 01/26/2022   GFRAA 82 02/20/2020   CALCIUM 9.0 01/26/2022  PROT 5.7 (L) 01/26/2022   ALBUMIN 3.4 (L) 01/26/2022   BILITOT 0.5 01/26/2022   ALKPHOS 41 01/26/2022   AST 19 01/26/2022   ALT 19 01/26/2022   ANIONGAP 12 01/26/2022    GFR: Estimated Creatinine Clearance: 79.5 mL/min (by C-G formula based on SCr of 0.8 mg/dL).  No results found for this or  any previous visit (from the past 240 hour(s)).    Radiology Studies: CT ANGIO GI BLEED  Result Date: 01/26/2022 CLINICAL DATA:  Lower gastrointestinal bleeding. EXAM: CTA ABDOMEN AND PELVIS WITHOUT AND WITH CONTRAST TECHNIQUE: Multidetector CT imaging of the abdomen and pelvis was performed using the standard protocol during bolus administration of intravenous contrast. Multiplanar reconstructed images and MIPs were obtained and reviewed to evaluate the vascular anatomy. RADIATION DOSE REDUCTION: This exam was performed according to the departmental dose-optimization program which includes automated exposure control, adjustment of the mA and/or kV according to patient size and/or use of iterative reconstruction technique. CONTRAST:  85m OMNIPAQUE IOHEXOL 350 MG/ML SOLN COMPARISON:  None Available. FINDINGS: VASCULAR Aorta: Atherosclerosis of abdominal aorta is noted without aneurysm or dissection. Celiac: Patent without evidence of aneurysm, dissection, vasculitis or significant stenosis. SMA: Patent without evidence of aneurysm, dissection, vasculitis or significant stenosis. Renals: Both renal arteries are patent without evidence of aneurysm, dissection, vasculitis, fibromuscular dysplasia or significant stenosis. IMA: Patent without evidence of aneurysm, dissection, vasculitis or significant stenosis. Inflow: Patent without evidence of aneurysm, dissection, vasculitis or significant stenosis. Proximal Outflow: Bilateral common femoral and visualized portions of the superficial and profunda femoral arteries are patent without evidence of aneurysm, dissection, vasculitis or significant stenosis. Veins: No obvious venous abnormality within the limitations of this arterial phase study. Review of the MIP images confirms the above findings. NON-VASCULAR Lower chest: 13 mm nodule is noted in left lower lobe. Hepatobiliary: No gallstones or biliary dilatation is noted. Probable hepatic steatosis. Pancreas: No  acute inflammation or ductal dilatation is noted. 3.8 cm cystic abnormality is noted in pancreatic head. Spleen: Normal in size without focal abnormality. Adrenals/Urinary Tract: Adrenal glands appear normal. No hydronephrosis or renal obstruction is noted. No renal or ureteral calculi are noted. Urinary bladder is unremarkable. Two right renal cysts are noted for which no further follow-up is required. Stomach/Bowel: The stomach is unremarkable. However there appears to be contrast extravasation involving the first and second portions of the duodenum consistent with active gastrointestinal bleeding. There is no evidence of bowel obstruction or inflammation. Postsurgical changes are seen in the rectosigmoid region. Lymphatic: No adenopathy is noted. Reproductive: Mild prostatic enlargement is noted. Postsurgical defect is noted. Other: No abdominal wall hernia or abnormality. No abdominopelvic ascites. Musculoskeletal: No acute or significant osseous findings. IMPRESSION: VASCULAR There is contrast extravasation involving the first and second portions of the duodenum consistent with active gastrointestinal bleeding. Aortic Atherosclerosis (ICD10-I70.0). NON-VASCULAR 13 mm nodule is noted in left lower lobe. PET scan is recommended for further evaluation. 3.8 cm cystic abnormality is seen in pancreatic head. 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 Indolent neoplasm, such as intraductal papillary mucinous tumor could look similar. Per consensus criteria, follow-up with preferably pre and post contrast abdominal MRI at 1 year is recommended. This recommendation follows ACR consensus guidelines: Managing Incidental Findings on Abdominal CT: White Paper of the ACR Incidental Findings Committee. J Am Coll Radiol 2010;7:754-773. Probable hepatic steatosis. Critical Value/emergent results were called by telephone at the time of interpretation on 01/26/2022 at 3:35 pm to provider ABIGAIL HARRIS , who verbally acknowledged these results.  Electronically Signed  By: Marijo Conception M.D.   On: 01/26/2022 15:36      LOS: 1 day    Cordelia Poche, MD [facility] 01/27/2022, 7:37 AM   If 7PM-7AM, please contact night-coverage [email or web address]

## 2022-01-27 NOTE — Progress Notes (Signed)
Inpatient Diabetes Program Recommendations  AACE/ADA: New Consensus Statement on Inpatient Glycemic Control (2015)  Target Ranges:  Prepandial:   less than 140 mg/dL      Peak postprandial:   less than 180 mg/dL (1-2 hours)      Critically ill patients:  140 - 180 mg/dL   Lab Results  Component Value Date   GLUCAP 229 (H) 01/27/2022   HGBA1C 7.2 (A) 11/05/2021    Review of Glycemic Control  Latest Reference Range & Units 01/26/22 21:22 01/27/22 06:11 01/27/22 11:45  Glucose-Capillary 70 - 99 mg/dL 294 (H) 215 (H) 229 (H)   Diabetes history: DM 2 Outpatient Diabetes medications:  Glucotrol 10 mg bid, Januvia 100 mg daily, Metformin 1000 mg bid Current orders for Inpatient glycemic control:  Novolog 0-15 units tid with meals and HS  Inpatient Diabetes Program Recommendations:   Consider changing Novolog correction to q 4 hours.  Also consider adding Levemir 5 units bid.   Thanks,  Adah Perl, RN, BC-ADM Inpatient Diabetes Coordinator Pager [phone]  (8a-5p)

## 2022-01-27 NOTE — Hospital Course (Addendum)
Walter [name] is a 81 y.o. male with a history of BPH, diabetes mellitus, hypertension, hyperlipidemia, OSA on CPAP. Patient presented secondary to black tarry stools with associated symptomatic anemia and evidence for upper GI bleeding. GI consulted and performed urgent upper endoscopy on 9/27 revealing a bleeding duodenal ulcer, which was treated.

## 2022-01-27 NOTE — Telephone Encounter (Signed)
Admitted to hospital

## 2022-01-28 ENCOUNTER — Inpatient Hospital Stay (HOSPITAL_COMMUNITY): Payer: Medicare Other | Admitting: Certified Registered Nurse Anesthetist

## 2022-01-28 ENCOUNTER — Encounter (HOSPITAL_COMMUNITY): Payer: Self-pay | Admitting: Gastroenterology

## 2022-01-28 ENCOUNTER — Encounter (HOSPITAL_COMMUNITY)
Admission: EM | Disposition: A | Discharge status: Home / Self Care | Payer: Self-pay | Source: Home / Self Care | Attending: Family Medicine

## 2022-01-28 DIAGNOSIS — K922 Gastrointestinal hemorrhage, unspecified: Secondary | ICD-10-CM | POA: Diagnosis not present

## 2022-01-28 DIAGNOSIS — I10 Essential (primary) hypertension: Secondary | ICD-10-CM | POA: Diagnosis not present

## 2022-01-28 DIAGNOSIS — G4730 Sleep apnea, unspecified: Secondary | ICD-10-CM | POA: Diagnosis not present

## 2022-01-28 DIAGNOSIS — K259 Gastric ulcer, unspecified as acute or chronic, without hemorrhage or perforation: Secondary | ICD-10-CM

## 2022-01-28 DIAGNOSIS — Z87891 Personal history of nicotine dependence: Secondary | ICD-10-CM | POA: Diagnosis not present

## 2022-01-28 DIAGNOSIS — K264 Chronic or unspecified duodenal ulcer with hemorrhage: Secondary | ICD-10-CM | POA: Diagnosis not present

## 2022-01-28 DIAGNOSIS — K279 Peptic ulcer, site unspecified, unspecified as acute or chronic, without hemorrhage or perforation: Secondary | ICD-10-CM | POA: Diagnosis not present

## 2022-01-28 DIAGNOSIS — E119 Type 2 diabetes mellitus without complications: Secondary | ICD-10-CM | POA: Diagnosis not present

## 2022-01-28 DIAGNOSIS — D62 Acute posthemorrhagic anemia: Secondary | ICD-10-CM | POA: Diagnosis not present

## 2022-01-28 HISTORY — PX: BIOPSY: SHX5522

## 2022-01-28 HISTORY — PX: ESOPHAGOGASTRODUODENOSCOPY (EGD) WITH PROPOFOL: SHX5813

## 2022-01-28 LAB — GLUCOSE, CAPILLARY
Glucose-Capillary: 177 mg/dL — ABNORMAL HIGH (ref 70–99)
Glucose-Capillary: 192 mg/dL — ABNORMAL HIGH (ref 70–99)
Glucose-Capillary: 173 mg/dL — ABNORMAL HIGH (ref 70–99)
Glucose-Capillary: 160 mg/dL — ABNORMAL HIGH (ref 70–99)
Glucose-Capillary: 295 mg/dL — ABNORMAL HIGH (ref 70–99)
Glucose-Capillary: 224 mg/dL — ABNORMAL HIGH (ref 70–99)

## 2022-01-28 LAB — CBC
HCT: 22.2 % — ABNORMAL LOW (ref 39.0–52.0)
Hemoglobin: 7.6 g/dL — ABNORMAL LOW (ref 13.0–17.0)
MCH: 30.8 pg (ref 26.0–34.0)
MCHC: 34.2 g/dL (ref 30.0–36.0)
MCV: 89.9 fL (ref 80.0–100.0)
Platelets: 138 10*3/uL — ABNORMAL LOW (ref 150–400)
RBC: 2.47 MIL/uL — ABNORMAL LOW (ref 4.22–5.81)
RDW: 16.1 % — ABNORMAL HIGH (ref 11.5–15.5)
WBC: 7.7 10*3/uL (ref 4.0–10.5)
nRBC: 0 % (ref 0.0–0.2)

## 2022-01-28 LAB — BASIC METABOLIC PANEL
Anion gap: 5 (ref 5–15)
BUN: 19 mg/dL (ref 8–23)
CO2: 24 mmol/L (ref 22–32)
Calcium: 7.8 mg/dL — ABNORMAL LOW (ref 8.9–10.3)
Chloride: 109 mmol/L (ref 98–111)
Creatinine, Ser: 0.85 mg/dL (ref 0.61–1.24)
GFR, Estimated: >60 mL/min (ref >60)
Glucose, Bld: 149 mg/dL — ABNORMAL HIGH (ref 70–99)
Potassium: 3.6 mmol/L (ref 3.5–5.1)
Sodium: 138 mmol/L (ref 135–145)

## 2022-01-28 LAB — MAGNESIUM: Magnesium: 1.5 mg/dL — ABNORMAL LOW (ref 1.7–2.4)

## 2022-01-28 SURGERY — ESOPHAGOGASTRODUODENOSCOPY (EGD) WITH PROPOFOL
Anesthesia: Monitor Anesthesia Care

## 2022-01-28 MED ORDER — IRBESARTAN 150 MG PO TABS
150.0000 mg | ORAL_TABLET | Freq: Every day | ORAL | Status: DC
  Administered 2022-01-28 – 2022-01-29 (×2): 150 mg via ORAL
  Filled 2022-01-28 (×2): qty 1

## 2022-01-28 MED ORDER — PROPOFOL 500 MG/50ML IV EMUL
INTRAVENOUS | Status: DC | PRN
  Administered 2022-01-28: 125 ug/kg/min via INTRAVENOUS

## 2022-01-28 MED ORDER — SODIUM CHLORIDE 0.9 % IV SOLN: INTRAVENOUS | Status: DC

## 2022-01-28 MED ORDER — AMLODIPINE BESYLATE 5 MG PO TABS
5.0000 mg | ORAL_TABLET | Freq: Every day | ORAL | Status: DC
  Administered 2022-01-28 – 2022-01-29 (×2): 5 mg via ORAL
  Filled 2022-01-28 (×2): qty 1

## 2022-01-28 MED ORDER — SODIUM CHLORIDE 0.9 % IV SOLN
250.0000 mg | Freq: Every day | INTRAVENOUS | Status: AC
  Administered 2022-01-28 – 2022-01-29 (×2): 250 mg via INTRAVENOUS
  Filled 2022-01-28 (×2): qty 20

## 2022-01-28 MED ORDER — PROPOFOL 10 MG/ML IV BOLUS
INTRAVENOUS | Status: DC | PRN
  Administered 2022-01-28: 20 mg via INTRAVENOUS

## 2022-01-28 SURGICAL SUPPLY — 15 items

## 2022-01-28 NOTE — Progress Notes (Signed)
Mobility Specialist Criteria Algorithm Info.   01/28/22 1142  Mobility  Activity Refused mobility   Patient declined due to lightheadedness. Will f/u later today as time permits.  [place] Samaj [place], [place]  PTYYP:[phone] Office: [phone]

## 2022-01-28 NOTE — Progress Notes (Signed)
Pt came back to rm 24 from PACU. Reinitiated tele. VSS. Call bell within reach.   Lavenia Atlas, RN

## 2022-01-28 NOTE — Anesthesia Preprocedure Evaluation (Addendum)
Anesthesia Evaluation  Patient identified by MRN, date of birth, ID band Patient awake    Reviewed: Allergy & Precautions, H&P , NPO status , Patient's Chart, lab work & pertinent test results  Airway Mallampati: II  TM Distance: >3 FB Neck ROM: Full    Dental no notable dental hx. (+) Teeth Intact, Dental Advisory Given   Pulmonary sleep apnea , former smoker,    Pulmonary exam normal breath sounds clear to auscultation       Cardiovascular hypertension, Pt. on medications  Rhythm:Regular Rate:Normal     Neuro/Psych negative neurological ROS  negative psych ROS   GI/Hepatic Neg liver ROS, PUD,   Endo/Other  diabetes, Type 2, Oral Hypoglycemic Agents  Renal/GU negative Renal ROS  negative genitourinary   Musculoskeletal   Abdominal   Peds  Hematology  (+) Blood dyscrasia, anemia ,   Anesthesia Other Findings   Reproductive/Obstetrics negative OB ROS                            Anesthesia Physical Anesthesia Plan  ASA: 3  Anesthesia Plan: MAC   Post-op Pain Management: Minimal or no pain anticipated   Induction: Intravenous  PONV Risk Score and Plan: 1 and Propofol infusion  Airway Management Planned: Natural Airway and Nasal Cannula  Additional Equipment:   Intra-op Plan:   Post-operative Plan:   Informed Consent: I have reviewed the patients History and Physical, chart, labs and discussed the procedure including the risks, benefits and alternatives for the proposed anesthesia with the patient or authorized representative who has indicated his/her understanding and acceptance.     Dental advisory given  Plan Discussed with: CRNA  Anesthesia Plan Comments:         Anesthesia Quick Evaluation

## 2022-01-28 NOTE — H&P (View-Only) (Signed)
Saw patient - he is stable and exam benign.  No BM since yesterday  Hgb 6.5 - 8.2 - 7.6  I am concerned there may still be intermittent slow bleeding from the DU and advised an EGD this morning.  He was agreeable.   The benefits and risks of the planned procedure were described in detail with the patient or (when appropriate) their health care proxy.  Risks were outlined as including, but not limited to, bleeding, infection, perforation, adverse medication reaction leading to cardiac or pulmonary decompensation, pancreatitis (if ERCP).  The limitation of incomplete mucosal visualization was also discussed.  No guarantees or warranties were given.   Spoke with patient's nurse as well.  - H. Danis,MD

## 2022-01-28 NOTE — Anesthesia Postprocedure Evaluation (Signed)
Anesthesia Post Note  Patient: Walter [name]  Procedure(s) Performed: ESOPHAGOGASTRODUODENOSCOPY (EGD) WITH PROPOFOL BIOPSY     Patient location during evaluation: Endoscopy Anesthesia Type: MAC Level of consciousness: awake and alert Pain management: pain level controlled Vital Signs Assessment: post-procedure vital signs reviewed and stable Respiratory status: spontaneous breathing, nonlabored ventilation and respiratory function stable Cardiovascular status: stable and blood pressure returned to baseline Postop Assessment: no apparent nausea or vomiting Anesthetic complications: no   No notable events documented.  Last Vitals:  Vitals:   01/28/22 1000 01/28/22 1022  BP: (!) 111/58 (!) 130/96  Pulse: 68 73  Resp: 16 20  Temp:  (!) 36.4 C  SpO2: 95% 96%    Last Pain:  Vitals:   01/28/22 1022  TempSrc: Oral  PainSc: 0-No pain                 Jazyiah Yiu,W. EDMOND

## 2022-01-28 NOTE — Interval H&P Note (Signed)
History and Physical Interval Note:  01/28/2022 9:26 AM  Walter [name]  has presented today for surgery, with the diagnosis of Duodenal ulcer.  The various methods of treatment have been discussed with the patient and family. After consideration of risks, benefits and other options for treatment, the patient has consented to  Procedure(s): ESOPHAGOGASTRODUODENOSCOPY (EGD) WITH PROPOFOL (N/A) as a surgical intervention.  The patient's history has been reviewed, patient examined, no change in status, stable for surgery.  I have reviewed the patient's chart and labs.  Questions were answered to the patient's satisfaction.      Nelida Meuse III

## 2022-01-28 NOTE — Progress Notes (Signed)
Pt had 1 medium black, tarry stool, no bright red found in stool.   Lavenia Atlas, RN

## 2022-01-28 NOTE — Transfer of Care (Signed)
Immediate Anesthesia Transfer of Care Note  Patient: Armonie Staten  Procedure(s) Performed: ESOPHAGOGASTRODUODENOSCOPY (EGD) WITH PROPOFOL BIOPSY  Patient Location: PACU  Anesthesia Type:MAC  Level of Consciousness: awake, alert  and oriented  Airway & Oxygen Therapy: Patient Spontanous Breathing  Post-op Assessment: Report given to RN and Post -op Vital signs reviewed and stable  Post vital signs: Reviewed and stable  Last Vitals:  Vitals Value Taken Time  BP 105/58 01/28/22 0945  Temp 36.8 C 01/28/22 0945  Pulse 71 01/28/22 0950  Resp 17 01/28/22 0950  SpO2 94 % 01/28/22 0950  Vitals shown include unvalidated device data.  Last Pain:  Vitals:   01/28/22 0945  TempSrc:   PainSc: 0-No pain      Patients Stated Pain Goal: 2 (74/25/95 6387)  Complications: No notable events documented.

## 2022-01-28 NOTE — Op Note (Signed)
[facility] Patient Name: Walter [name] Procedure Date : 01/28/2022 MRN: [id number] Attending MD: Estill Cotta. Loletha Carrow , MD Date of Birth: 1941/01/27 CSN: [id number] Age: 81 Admit Type: Inpatient Procedure:                Upper GI endoscopy Indications:              Follow-up of duodenal ulcer with hemorrhage Providers:                Mallie Mussel L. Loletha Carrow, MD, Jaci Carrel, RN, Darliss Cheney, Technician Referring MD:             [facility] Medicines:                Monitored Anesthesia Care Complications:            No immediate complications. Estimated Blood Loss:     Estimated blood loss was minimal. Procedure:                Pre-Anesthesia Assessment:                           - Prior to the procedure, a History and Physical                            was performed, and patient medications and                            allergies were reviewed. The patient's tolerance of                            previous anesthesia was also reviewed. The risks                            and benefits of the procedure and the sedation                            options and risks were discussed with the patient.                            All questions were answered, and informed consent                            was obtained. Prior Anticoagulants: The patient has                            taken no previous anticoagulant or antiplatelet                            agents. ASA Grade Assessment: III - A patient with                            severe systemic disease. After reviewing the risks  and benefits, the patient was deemed in                            satisfactory condition to undergo the procedure.                           After obtaining informed consent, the endoscope was                            passed under direct vision. Throughout the                            procedure, the patient's blood pressure, pulse, and                             oxygen saturations were monitored continuously. The                            GIF-H190 [id number] Olympus endoscope was introduced                            through the mouth, and advanced to the duodenal                            bulb. The upper GI endoscopy was accomplished                            without difficulty. The patient tolerated the                            procedure well. Scope In: Scope Out: Findings:      The esophagus was normal.      Few non-bleeding linear and superficial gastric ulcers with no stigmata       of bleeding were found in the prepyloric region of the stomach. Biopsies       were taken with a cold forceps for histology to r/o H pylori (antrum and       body).      The exam of the stomach was otherwise normal.      The cardia and gastric fundus were normal on retroflexion.      One non-bleeding duodenal ulcer with no stigmata of bleeding was found       in the duodenal bulb. Clip still attached. The lesion was 10 mm in       largest dimension. Scope not advanced to second portion of duodenum so       as not to traumatize ulcer. Impression:               - Normal esophagus.                           - Non-bleeding gastric ulcers with no stigmata of                            bleeding. Biopsied.                           -  Non-bleeding duodenal ulcer with no stigmata of                            bleeding.                           No fresh or old blood in the UGI tract. Recommendation:           - Return patient to hospital ward for ongoing care.                           - Resume regular diet.                           - Discontinue protonix drip and change to 40 mg PO                            BID.                           IV iron ordered.                           Check CBC tomorrow AM                           - The findings and recommendations were discussed                            with the patient's family (wife Walter [name] by  phone). 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 All rights reserved. The codes  documented in this report are preliminary and upon coder review may  be revised to meet current compliance requirements. Walter [name] L. Loletha Carrow, MD 01/28/2022 9:51:34 AM This report has been signed electronically. Number of Addenda: 0

## 2022-01-28 NOTE — Progress Notes (Signed)
PROGRESS NOTE    Walter [name]  SEG:[id number] DOB: 03/08/1941 DOA: 01/26/2022 PCP: [name], MD   Brief Narrative: Walter [name] is a 81 y.o. male with a history of BPH, diabetes mellitus, hypertension, hyperlipidemia, OSA on CPAP. Patient presented secondary to black tarry stools with associated symptomatic anemia and evidence for upper GI bleeding. GI consulted and performed urgent upper endoscopy on 9/27 revealing a bleeding duodenal ulcer, which was treated.    Assessment and Plan:  Upper GI bleeding Duodenal ulcer Patient presented with melena and symptomatic anemia concerning for upper GI bleeding. GI was consulted and performed urgent upper endoscopy on 9/27 which was significant for clotted blood in stomach and an oozing duodenal ulcer with blood in the duodenal bulb; this was treated with cautery, clip was placed and hemostatic spray applied. Repeat upper endoscopy performed on 9/29 without evidence of active bleeding. -GI recommendations: Protonix, IV iron  Acute blood loss anemia Secondary to above. Hemoglobin of 6.5 on admission. Patient has been transfused a total of 4 units of pRBC. Hemoglobin of 7.6 on CBC this morning. -Serial CBC -Will hold off on repeat transfusion since likelihood is patient is not continually bleeding per GI's report  Primary hypertension -Continue amlodipine and irbesartan (substituted for irbesartan)  Diabetes mellitus, type 2 Controlled for patient's age with most recent hemoglobin A1C of 7.2%. -Continue SSI  Hyperlipidemia -Continue Crestor  OSA -Continue CPAP  Mild cognitive impairment -Continue Aricept  DVT prophylaxis: SCDs Code Status:   Code Status: Full Code Family Communication: None at bedside Disposition Plan: Discharge home in 1-2 days pending stable hemoglobin and GI recommendations for discharge.   Consultants:  [facility]  Procedures:  9/27: Upper endoscopy  Antimicrobials: None     Subjective: No issues overnight. No melena from overnight. Nursing this afternoon indicates patient had recurrent melenotic stool  Objective: BP (!) 140/63 (BP Location: Right Arm)   Pulse 89   Temp 98.1 F (36.7 C) (Other (Comment))   Resp 20   Ht 6' (1.829 m)   Wt 87.1 kg   SpO2 96%   BMI 26.04 kg/m   Examination:  General exam: Appears calm and comfortable Respiratory system: Clear to auscultation. Respiratory effort normal. Cardiovascular system: S1 & S2 heard, RRR. No murmurs, rubs, gallops or clicks. Gastrointestinal system: Abdomen is nondistended, soft and nontender. Normal bowel sounds heard. Central nervous system: Alert and oriented. No focal neurological deficits. Musculoskeletal: No edema. No calf tenderness Skin: No cyanosis. No rashes Psychiatry: Judgement and insight appear normal. Mood & affect appropriate.    Data Reviewed: I have personally reviewed following labs and imaging studies  CBC Lab Results  Component Value Date   WBC 7.7 01/28/2022   RBC 2.47 (L) 01/28/2022   HGB 7.6 (L) 01/28/2022   HCT 22.2 (L) 01/28/2022   MCV 89.9 01/28/2022   MCH 30.8 01/28/2022   PLT 138 (L) 01/28/2022   MCHC 34.2 01/28/2022   RDW 16.1 (H) 01/28/2022   LYMPHSABS 1.2 03/02/2021   MONOABS 0.7 03/02/2021   EOSABS 0.2 03/02/2021   BASOSABS 0.0 73/71/0626     Last metabolic panel Lab Results  Component Value Date   NA 138 01/28/2022   K 3.6 01/28/2022   CL 109 01/28/2022   CO2 24 01/28/2022   BUN 19 01/28/2022   CREATININE 0.85 01/28/2022   GLUCOSE 149 (H) 01/28/2022   GFRNONAA >60 01/28/2022   GFRAA 82 02/20/2020   CALCIUM 7.8 (L) 01/28/2022   PROT 5.7 (L) 01/26/2022  ALBUMIN 3.4 (L) 01/26/2022   BILITOT 0.5 01/26/2022   ALKPHOS 41 01/26/2022   AST 19 01/26/2022   ALT 19 01/26/2022   ANIONGAP 5 01/28/2022    GFR: Estimated Creatinine Clearance: 74.8 mL/min (by C-G formula based on SCr of 0.85 mg/dL).  No results found for this or any  previous visit (from the past 240 hour(s)).    Radiology Studies: CT ANGIO GI BLEED  Result Date: 01/26/2022 CLINICAL DATA:  Lower gastrointestinal bleeding. EXAM: CTA ABDOMEN AND PELVIS WITHOUT AND WITH CONTRAST TECHNIQUE: Multidetector CT imaging of the abdomen and pelvis was performed using the standard protocol during bolus administration of intravenous contrast. Multiplanar reconstructed images and MIPs were obtained and reviewed to evaluate the vascular anatomy. RADIATION DOSE REDUCTION: This exam was performed according to the departmental dose-optimization program which includes automated exposure control, adjustment of the mA and/or kV according to patient size and/or use of iterative reconstruction technique. CONTRAST:  70m OMNIPAQUE IOHEXOL 350 MG/ML SOLN COMPARISON:  None Available. FINDINGS: VASCULAR Aorta: Atherosclerosis of abdominal aorta is noted without aneurysm or dissection. Celiac: Patent without evidence of aneurysm, dissection, vasculitis or significant stenosis. SMA: Patent without evidence of aneurysm, dissection, vasculitis or significant stenosis. Renals: Both renal arteries are patent without evidence of aneurysm, dissection, vasculitis, fibromuscular dysplasia or significant stenosis. IMA: Patent without evidence of aneurysm, dissection, vasculitis or significant stenosis. Inflow: Patent without evidence of aneurysm, dissection, vasculitis or significant stenosis. Proximal Outflow: Bilateral common femoral and visualized portions of the superficial and profunda femoral arteries are patent without evidence of aneurysm, dissection, vasculitis or significant stenosis. Veins: No obvious venous abnormality within the limitations of this arterial phase study. Review of the MIP images confirms the above findings. NON-VASCULAR Lower chest: 13 mm nodule is noted in left lower lobe. Hepatobiliary: No gallstones or biliary dilatation is noted. Probable hepatic steatosis. Pancreas: No acute  inflammation or ductal dilatation is noted. 3.8 cm cystic abnormality is noted in pancreatic head. Spleen: Normal in size without focal abnormality. Adrenals/Urinary Tract: Adrenal glands appear normal. No hydronephrosis or renal obstruction is noted. No renal or ureteral calculi are noted. Urinary bladder is unremarkable. Two right renal cysts are noted for which no further follow-up is required. Stomach/Bowel: The stomach is unremarkable. However there appears to be contrast extravasation involving the first and second portions of the duodenum consistent with active gastrointestinal bleeding. There is no evidence of bowel obstruction or inflammation. Postsurgical changes are seen in the rectosigmoid region. Lymphatic: No adenopathy is noted. Reproductive: Mild prostatic enlargement is noted. Postsurgical defect is noted. Other: No abdominal wall hernia or abnormality. No abdominopelvic ascites. Musculoskeletal: No acute or significant osseous findings. IMPRESSION: VASCULAR There is contrast extravasation involving the first and second portions of the duodenum consistent with active gastrointestinal bleeding. Aortic Atherosclerosis (ICD10-I70.0). NON-VASCULAR 13 mm nodule is noted in left lower lobe. PET scan is recommended for further evaluation. 3.8 cm cystic abnormality is seen in pancreatic head. 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 Indolent neoplasm, such as intraductal papillary mucinous tumor could look similar. Per consensus criteria, follow-up with preferably pre and post contrast abdominal MRI at 1 year is recommended. This recommendation follows ACR consensus guidelines: Managing Incidental Findings on Abdominal CT: White Paper of the ACR Incidental Findings Committee. J Am Coll Radiol 2010;7:754-773. Probable hepatic steatosis. Critical Value/emergent results were called by telephone at the time of interpretation on 01/26/2022 at 3:35 pm to provider ABIGAIL HARRIS , who verbally acknowledged these results.  Electronically Signed   By: JSabino Dick  Jr M.D.   On: 01/26/2022 15:36      LOS: 2 days    Cordelia Poche, MD [facility] 01/28/2022, 2:19 PM   If 7PM-7AM, please contact night-coverage [email or web address]

## 2022-01-28 NOTE — Anesthesia Procedure Notes (Signed)
Procedure Name: MAC Date/Time: 01/28/2022 9:34 AM  Performed by: Inda Coke, CRNAPre-anesthesia Checklist: Patient identified, Emergency Drugs available, Suction available, Timeout performed and Patient being monitored Patient Re-evaluated:Patient Re-evaluated prior to induction Oxygen Delivery Method: Nasal cannula Induction Type: IV induction Dental Injury: Teeth and Oropharynx as per pre-operative assessment

## 2022-01-28 NOTE — Progress Notes (Signed)
Patient declined the use of CPAP for the night  

## 2022-01-28 NOTE — Progress Notes (Signed)
Saw patient - he is stable and exam benign.  No BM since yesterday  Hgb 6.5 - 8.2 - 7.6  I am concerned there may still be intermittent slow bleeding from the DU and advised an EGD this morning.  He was agreeable.   The benefits and risks of the planned procedure were described in detail with the patient or (when appropriate) their health care proxy.  Risks were outlined as including, but not limited to, bleeding, infection, perforation, adverse medication reaction leading to cardiac or pulmonary decompensation, pancreatitis (if ERCP).  The limitation of incomplete mucosal visualization was also discussed.  No guarantees or warranties were given.   Spoke with patient's nurse as well.  - H. Danis,MD

## 2022-01-29 DIAGNOSIS — K922 Gastrointestinal hemorrhage, unspecified: Secondary | ICD-10-CM | POA: Diagnosis not present

## 2022-01-29 LAB — GLUCOSE, CAPILLARY
Glucose-Capillary: 201 mg/dL — ABNORMAL HIGH (ref 70–99)
Glucose-Capillary: 233 mg/dL — ABNORMAL HIGH (ref 70–99)

## 2022-01-29 LAB — CBC
HCT: 25.1 % — ABNORMAL LOW (ref 39.0–52.0)
Hemoglobin: 8.5 g/dL — ABNORMAL LOW (ref 13.0–17.0)
MCH: 30.5 pg (ref 26.0–34.0)
MCHC: 33.9 g/dL (ref 30.0–36.0)
MCV: 90 fL (ref 80.0–100.0)
Platelets: 182 10*3/uL (ref 150–400)
RBC: 2.79 MIL/uL — ABNORMAL LOW (ref 4.22–5.81)
RDW: 16 % — ABNORMAL HIGH (ref 11.5–15.5)
WBC: 8.3 10*3/uL (ref 4.0–10.5)
nRBC: 0 % (ref 0.0–0.2)

## 2022-01-29 MED ORDER — PANTOPRAZOLE SODIUM 40 MG PO TBEC: 40.0000 mg | DELAYED_RELEASE_TABLET | Freq: Two times a day (BID) | ORAL | 2 refills | Status: DC

## 2022-01-29 MED ORDER — INFLUENZA VAC A&B SA ADJ QUAD 0.5 ML IM PRSY: 0.5000 mL | PREFILLED_SYRINGE | INTRAMUSCULAR | Status: DC

## 2022-01-29 MED ORDER — FERROUS SULFATE 325 (65 FE) MG PO TABS: 325.0000 mg | ORAL_TABLET | Freq: Two times a day (BID) | ORAL | Status: AC

## 2022-01-29 NOTE — Progress Notes (Signed)
Discharge instructions (including medications) discussed with and copy provided to patient/caregiver 

## 2022-01-29 NOTE — Progress Notes (Signed)
Mobility Specialist Progress Note:   01/29/22 1101  Mobility  Activity Ambulated independently in room  Level of Assistance Independent  Assistive Device None  Distance Ambulated (ft) 80 ft  Activity Response Tolerated well  $Mobility charge 1 Mobility   Pt received in bed and agreeable. No complaints. Pt left sitting EOB with all needs met and call bell in reach.   Oakley Orban Mobility Specialist-Acute Rehab Secure Chat only

## 2022-01-29 NOTE — Discharge Summary (Signed)
Physician Discharge Summary   Patient: Walter [name] MRN: [id number] DOB: Feb 12, 1941  Admit date:     01/26/2022  Discharge date: 01/29/22  Discharge Physician: Cordelia Poche, MD   PCP: [name], MD   Recommendations at discharge:  PCP and GI follow-up No NSAIDs Repeat CBC in 2 weeks with GI  Discharge Diagnoses: Principal Problem:   Acute upper GI bleeding Active Problems:   Diabetes mellitus type II, controlled   Hyperlipidemia associated with type 2 diabetes mellitus   Obstructive sleep apnea   Essential hypertension   Mild neurocognitive disorder   ABLA (acute blood loss anemia)   Duodenal ulcer hemorrhage   Melena   Multiple gastric ulcers  Resolved Problems:   * No resolved hospital problems. *  Hospital Course: Walter [name] is a 81 y.o. male with a history of BPH, diabetes mellitus, hypertension, hyperlipidemia, OSA on CPAP. Patient presented secondary to black tarry stools with associated symptomatic anemia and evidence for upper GI bleeding. GI consulted and performed urgent upper endoscopy on 9/27 revealing a bleeding duodenal ulcer, which was treated.   Assessment and Plan:  Upper GI bleeding Duodenal ulcer Patient presented with melena and symptomatic anemia concerning for upper GI bleeding. GI was consulted and performed urgent upper endoscopy on 9/27 which was significant for clotted blood in stomach and an oozing duodenal ulcer with blood in the duodenal bulb; this was treated with cautery, clip was placed and hemostatic spray applied. Repeat upper endoscopy performed on 9/29 without evidence of active bleeding. Patient infused IV iron prior to discharge per GI. Discharge on iron sulfate 325 mg BID and Protonix 40 mg BID. No NSAIDs. Outpatient follow-up with GI.   Acute blood loss anemia Secondary to above. Hemoglobin of 6.5 on admission. Patient has been transfused a total of 4 units of pRBC. Hemoglobin of 8.5 on day of discharge. Repeat CBC in 2 weeks with  GI.   Primary hypertension Continue amlodipine and irbesartan.   Diabetes mellitus, type 2 Controlled for patient's age with most recent hemoglobin A1C of 7.2%. Continue home regimen of metformin, Januvia and glipizide   Hyperlipidemia Continue Crestor.   OSA Continue CPAP.   Mild cognitive impairment Continue Aricept.   Consultants: [facility] Procedures performed: Upper endoscopy  Disposition: Home Diet recommendation: Carb modified diet  DISCHARGE MEDICATION: Allergies as of 01/29/2022       Reactions   Bactrim [sulfamethoxazole-trimethoprim] Nausea And Vomiting   Pt has chills and fevers also.        Medication List     STOP taking these medications    fluticasone 50 MCG/ACT nasal spray Commonly known as: FLONASE       TAKE these medications    amLODipine 5 MG tablet Commonly known as: NORVASC TAKE 1 TABLET BY MOUTH EVERY DAY   CENTRUM SILVER PO Take 1 tablet by mouth daily.   donepezil 10 MG tablet Commonly known as: ARICEPT TAKE 1 TABLET BY MOUTH EVERYDAY AT BEDTIME What changed: See the new instructions.   ferrous sulfate 325 (65 FE) MG tablet Take 1 tablet (325 mg total) by mouth 2 (two) times daily with a meal.   glipiZIDE 10 MG tablet Commonly known as: GLUCOTROL TAKE 1 TABLET (10 MG TOTAL) BY MOUTH 2 (TWO) TIMES DAILY BEFORE A MEAL.   Januvia 100 MG tablet Generic drug: sitaGLIPtin TAKE 1 TABLET BY MOUTH EVERY DAY What changed: how much to take   Lutein 20 MG Tabs Take 20 mg by mouth daily.  metFORMIN 1000 MG tablet Commonly known as: GLUCOPHAGE TAKE 1 TABLET BY MOUTH 2 TIMES DAILY WITH A MEAL.   OneTouch Ultra test strip Generic drug: glucose blood USE ONCE DAILY AS INSTRUCTED   pantoprazole 40 MG tablet Commonly known as: Protonix Take 1 tablet (40 mg total) by mouth 2 (two) times daily.   rosuvastatin 20 MG tablet Commonly known as: CRESTOR TAKE 1 TABLET BY MOUTH ONE TIME PER WEEK What changed: See the  new instructions.   telmisartan 40 MG tablet Commonly known as: MICARDIS TAKE 1 TABLET BY MOUTH EVERY DAY   VITAMIN A PO Take 1 tablet by mouth daily.        Follow-up Information     [name], MD. Schedule an appointment as soon as possible for a visit in 1 week(s).   Specialty: Family Medicine Why: For hospital follow-up Contact information: [address] [place] [address] [phone]         Ladene Artist, MD. Schedule an appointment as soon as possible for a visit in 2 week(s).   Specialty: Gastroenterology Why: For hospital follow-up Contact information: [address] [address] [phone]                Discharge Exam: BP 139/69 (BP Location: Left Arm)   Pulse 79   Temp 98.6 F (37 C) (Oral)   Resp 20   Ht 6' (1.829 m)   Wt 87.1 kg   SpO2 96%   BMI 26.04 kg/m   General exam: Appears calm and comfortable. Respiratory system: Respiratory effort normal. Gastrointestinal system: Abdomen is non-distended. Central nervous system: Alert and oriented. Psychiatry: Judgement and insight appear normal. Mood & affect appropriate.   Condition at discharge: stable  The results of significant diagnostics from this hospitalization (including imaging, microbiology, ancillary and laboratory) are listed below for reference.   Imaging Studies: CT ANGIO GI BLEED  Result Date: 01/26/2022 CLINICAL DATA:  Lower gastrointestinal bleeding. EXAM: CTA ABDOMEN AND PELVIS WITHOUT AND WITH CONTRAST TECHNIQUE: Multidetector CT imaging of the abdomen and pelvis was performed using the standard protocol during bolus administration of intravenous contrast. Multiplanar reconstructed images and MIPs were obtained and reviewed to evaluate the vascular anatomy. RADIATION DOSE REDUCTION: This exam was performed according to the departmental dose-optimization program which includes automated exposure control, adjustment of the mA and/or kV  according to patient size and/or use of iterative reconstruction technique. CONTRAST:  52m OMNIPAQUE IOHEXOL 350 MG/ML SOLN COMPARISON:  None Available. FINDINGS: VASCULAR Aorta: Atherosclerosis of abdominal aorta is noted without aneurysm or dissection. Celiac: Patent without evidence of aneurysm, dissection, vasculitis or significant stenosis. SMA: Patent without evidence of aneurysm, dissection, vasculitis or significant stenosis. Renals: Both renal arteries are patent without evidence of aneurysm, dissection, vasculitis, fibromuscular dysplasia or significant stenosis. IMA: Patent without evidence of aneurysm, dissection, vasculitis or significant stenosis. Inflow: Patent without evidence of aneurysm, dissection, vasculitis or significant stenosis. Proximal Outflow: Bilateral common femoral and visualized portions of the superficial and profunda femoral arteries are patent without evidence of aneurysm, dissection, vasculitis or significant stenosis. Veins: No obvious venous abnormality within the limitations of this arterial phase study. Review of the MIP images confirms the above findings. NON-VASCULAR Lower chest: 13 mm nodule is noted in left lower lobe. Hepatobiliary: No gallstones or biliary dilatation is noted. Probable hepatic steatosis. Pancreas: No acute inflammation or ductal dilatation is noted. 3.8 cm cystic abnormality is noted in pancreatic head. Spleen: Normal in size without focal abnormality.  Adrenals/Urinary Tract: Adrenal glands appear normal. No hydronephrosis or renal obstruction is noted. No renal or ureteral calculi are noted. Urinary bladder is unremarkable. Two right renal cysts are noted for which no further follow-up is required. Stomach/Bowel: The stomach is unremarkable. However there appears to be contrast extravasation involving the first and second portions of the duodenum consistent with active gastrointestinal bleeding. There is no evidence of bowel obstruction or inflammation.  Postsurgical changes are seen in the rectosigmoid region. Lymphatic: No adenopathy is noted. Reproductive: Mild prostatic enlargement is noted. Postsurgical defect is noted. Other: No abdominal wall hernia or abnormality. No abdominopelvic ascites. Musculoskeletal: No acute or significant osseous findings. IMPRESSION: VASCULAR There is contrast extravasation involving the first and second portions of the duodenum consistent with active gastrointestinal bleeding. Aortic Atherosclerosis (ICD10-I70.0). NON-VASCULAR 13 mm nodule is noted in left lower lobe. PET scan is recommended for further evaluation. 3.8 cm cystic abnormality is seen in pancreatic head. 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 Indolent neoplasm, such as intraductal papillary mucinous tumor could look similar. Per consensus criteria, follow-up with preferably pre and post contrast abdominal MRI at 1 year is recommended. This recommendation follows ACR consensus guidelines: Managing Incidental Findings on Abdominal CT: White Paper of the ACR Incidental Findings Committee. J Am Coll Radiol 2010;7:754-773. Probable hepatic steatosis. Critical Value/emergent results were called by telephone at the time of interpretation on 01/26/2022 at 3:35 pm to provider ABIGAIL HARRIS , who verbally acknowledged these results. Electronically Signed   By: Marijo Conception M.D.   On: 01/26/2022 15:36    Microbiology: Results for orders placed or performed in visit on 01/25/16  Urine culture     Status: None   Collection Time: 01/25/16  9:00 AM   Specimen: Urine  Result Value Ref Range Status   Organism ID, Bacteria NO GROWTH  Final    Labs: CBC: Recent Labs  Lab 01/26/22 1333 01/26/22 1348 01/26/22 2218 01/27/22 0623 01/27/22 2301 01/28/22 0604 01/29/22 0808  WBC 21.4*  --  11.5* 11.2*  --  7.7 8.3  HGB 7.3*   < > 7.0* 6.5* 8.2* 7.6* 8.5*  HCT 21.6*   < > 21.1* 19.0* 23.6* 22.2* 25.1*  MCV 96.4  --  91.3 90.0  --  89.9 90.0  PLT 275  --  170 159  --  138* 182    < > = values in this interval not displayed.   Basic Metabolic Panel: Recent Labs  Lab 01/26/22 1333 01/26/22 1348 01/28/22 0100  NA 137 136 138  K 4.4 4.3 3.6  CL 106 105 109  CO2 19*  --  24  GLUCOSE 272* 267* 149*  BUN 44* 45* 19  CREATININE 1.12 0.80 0.85  CALCIUM 9.0  --  7.8*  MG  --   --  1.5*   Liver Function Tests: Recent Labs  Lab 01/26/22 1333  AST 19  ALT 19  ALKPHOS 41  BILITOT 0.5  PROT 5.7*  ALBUMIN 3.4*   CBG: Recent Labs  Lab 01/28/22 1123 01/28/22 1815 01/28/22 2130 01/29/22 0611 01/29/22 1108  GLUCAP 160* 295* 224* 201* 233*    Discharge time spent: 35 minutes.  Signed: Cordelia Poche, MD [facility] 01/29/2022

## 2022-01-29 NOTE — Plan of Care (Signed)

## 2022-01-29 NOTE — Progress Notes (Signed)
[facility] Progress Note  Chief Complaint: Bleeding duodenal ulcer with anemia  History:  No further overt GI bleeding.  He had a large bowel movement this morning and saved it so I could examine it.  (Large dark formed bowel movement, no melena or hematochezia) Tolerating a regular diet without difficulty. ROS: Cardiovascular: Denies chest pain Respiratory: Denies dyspnea   Objective:   Current Facility-Administered Medications:    acetaminophen (TYLENOL) tablet 650 mg, 650 mg, Oral, Q6H PRN **OR** acetaminophen (TYLENOL) suppository 650 mg, 650 mg, Rectal, Q6H PRN, Danis, Estill Cotta III, MD   amLODipine (NORVASC) tablet 5 mg, 5 mg, Oral, Daily, Mariel Aloe, MD, 5 mg at 01/28/22 1233   donepezil (ARICEPT) tablet 10 mg, 10 mg, Oral, QHS, Danis, Estill Cotta III, MD, 10 mg at 01/28/22 2252   ferric gluconate (FERRLECIT) 250 mg in sodium chloride 0.9 % 250 mL IVPB, 250 mg, Intravenous, Daily, Nelida Meuse III, MD, Last Rate: 135 mL/hr at 01/28/22 1539, Infusion Verify at 01/28/22 1539   fluticasone (FLONASE) 50 MCG/ACT nasal spray 2 spray, 2 spray, Each Nare, Daily, Danis, Estill Cotta III, MD   hydrALAZINE (APRESOLINE) injection 5 mg, 5 mg, Intravenous, Q4H PRN, Loletha Carrow, Kirke Corin, MD   [START ON 01/30/2022] influenza vaccine adjuvanted (FLUAD) injection 0.5 mL, 0.5 mL, Intramuscular, Tomorrow-1000, Nettey, Ralph A, MD   insulin aspart (novoLOG) injection 0-15 Units, 0-15 Units, Subcutaneous, TID WC, Danis, Estill Cotta III, MD, 5 Units at 01/29/22 0719   insulin aspart (novoLOG) injection 0-5 Units, 0-5 Units, Subcutaneous, QHS, Danis, Estill Cotta III, MD, 2 Units at 01/28/22 2253   irbesartan (AVAPRO) tablet 150 mg, 150 mg, Oral, Daily, Mariel Aloe, MD, 150 mg at 01/28/22 1233   morphine (PF) 2 MG/ML injection 2 mg, 2 mg, Intravenous, Q2H PRN, Danis, Estill Cotta III, MD   ondansetron (ZOFRAN) tablet 4 mg, 4 mg, Oral, Q6H PRN **OR** ondansetron (ZOFRAN) injection 4 mg, 4 mg, Intravenous, Q6H PRN, Danis,  Estill Cotta III, MD   sodium chloride flush (NS) 0.9 % injection 3 mL, 3 mL, Intravenous, Q12H, Danis, Estill Cotta III, MD, 3 mL at 01/28/22 2253   ferric gluconate (FERRLECIT) IVPB 135 mL/hr at 01/28/22 1539     Vital signs in last 24 hrs: Vitals:   01/29/22 0443 01/29/22 0800  BP: 128/65 (!) 144/68  Pulse: 73 77  Resp: 16 17  Temp: 98.7 F (37.1 C) 98.6 F (37 C)  SpO2: 98% 95%    Intake/Output Summary (Last 24 hours) at 01/29/2022 1026 Last data filed at 01/28/2022 1850 Gross per 24 hour  Intake 887.33 ml  Output 500 ml  Net 387.33 ml     Physical Exam Looks well, alert and conversational Abdomen: soft, no tenderness, with active bowel sounds. No guarding or palpable hepatosplenomegaly Skin; warm and dry, no jaundice.  Pale  Recent Labs:     Latest Ref Rng & Units 01/29/2022    8:08 AM 01/28/2022    6:04 AM 01/27/2022   11:01 PM  CBC  WBC 4.0 - 10.5 K/uL 8.3  7.7    Hemoglobin 13.0 - 17.0 g/dL 8.5  7.6  8.2   Hematocrit 39.0 - 52.0 % 25.1  22.2  23.6   Platelets 150 - 400 K/uL 182  138      Recent Labs  Lab 01/26/22 1333  INR 1.2      Latest Ref Rng & Units 01/28/2022    1:00 AM 01/26/2022    1:48  PM 01/26/2022    1:33 PM  CMP  Glucose 70 - 99 mg/dL 149  267  272   BUN 8 - 23 mg/dL 19  45  44   Creatinine 0.61 - 1.24 mg/dL 0.85  0.80  1.12   Sodium 135 - 145 mmol/L 138  136  137   Potassium 3.5 - 5.1 mmol/L 3.6  4.3  4.4   Chloride 98 - 111 mmol/L 109  105  106   CO2 22 - 32 mmol/L 24   19   Calcium 8.9 - 10.3 mg/dL 7.8   9.0   Total Protein 6.5 - 8.1 g/dL   5.7   Total Bilirubin 0.3 - 1.2 mg/dL   0.5   Alkaline Phos 38 - 126 U/L   41   AST 15 - 41 U/L   19   ALT 0 - 44 U/L   19      Radiologic studies:   Assessment & Plan  Assessment: Bleeding duodenal ulcer stopped with endoscopic therapy. Repeat EGD yesterday confirmed no recurrence of bleeding.  Acute blood loss anemia, stable, transfusions of PRBCs and IV iron this admission.  Aspirin likely  aspirin/NSAID induced, biopsies pending for H. pylori.    Plan: Home today on a regular diet with the following medicines:  Pantoprazole 40 mg by mouth twice daily Iron sulfate 325 mg twice daily (I made him aware this will turn his stool dark or black) No aspirin or NSAIDs.  Please check medicine cabinets at home for ibuprofen, Aleve or other NSAIDs.  I will message his primary GI physician at our office, Lucio Edward, to arrange a CBC within 2 weeks and a follow-up office visit.  [facility] messaged.   Nelida Meuse III Office: [phone]

## 2022-01-29 NOTE — Discharge Instructions (Addendum)
Walter [name],  You were in the hospital with a bleeding ulcer. Thankfully, this was treated by the GI physician. Please follow-up with your PCP and GI physician. Please refrain from using NSAIDs (ibuprofen, Aleve, Naproxen, Advil, Motrin, etc).

## 2022-01-30 ENCOUNTER — Encounter (HOSPITAL_COMMUNITY): Payer: Self-pay | Admitting: Gastroenterology

## 2022-01-30 LAB — BPAM RBC
Blood Product Expiration Date: 202310022359
Blood Product Expiration Date: 202310212359
Blood Product Expiration Date: 202310232359
Blood Product Expiration Date: 202310232359
Blood Product Expiration Date: 202310312359
Blood Product Expiration Date: 202310312359
ISSUE DATE / TIME: 202309221030
ISSUE DATE / TIME: 202309271644
ISSUE DATE / TIME: 202309271843
ISSUE DATE / TIME: 202309280804
ISSUE DATE / TIME: 202309281615
Unit Type and Rh: 5100
Unit Type and Rh: 5100
Unit Type and Rh: 5100
Unit Type and Rh: 5100
Unit Type and Rh: 5100
Unit Type and Rh: 9500

## 2022-01-30 LAB — TYPE AND SCREEN
ABO/RH(D): O POS
Antibody Screen: NEGATIVE
Unit division: 0
Unit division: 0
Unit division: 0
Unit division: 0
Unit division: 0
Unit division: 0

## 2022-01-31 ENCOUNTER — Ambulatory Visit: Payer: Medicare Other | Admitting: Podiatry

## 2022-01-31 ENCOUNTER — Ambulatory Visit (INDEPENDENT_AMBULATORY_CARE_PROVIDER_SITE_OTHER): Payer: Medicare Other

## 2022-01-31 ENCOUNTER — Telehealth: Payer: Self-pay

## 2022-01-31 ENCOUNTER — Other Ambulatory Visit: Payer: Self-pay

## 2022-01-31 ENCOUNTER — Telehealth: Payer: Self-pay | Admitting: Podiatry

## 2022-01-31 DIAGNOSIS — K922 Gastrointestinal hemorrhage, unspecified: Secondary | ICD-10-CM

## 2022-01-31 DIAGNOSIS — E119 Type 2 diabetes mellitus without complications: Secondary | ICD-10-CM | POA: Diagnosis not present

## 2022-01-31 DIAGNOSIS — M2031 Hallux varus (acquired), right foot: Secondary | ICD-10-CM | POA: Diagnosis not present

## 2022-01-31 DIAGNOSIS — M79675 Pain in left toe(s): Secondary | ICD-10-CM

## 2022-01-31 DIAGNOSIS — M216X1 Other acquired deformities of right foot: Secondary | ICD-10-CM | POA: Diagnosis not present

## 2022-01-31 DIAGNOSIS — M79674 Pain in right toe(s): Secondary | ICD-10-CM | POA: Diagnosis not present

## 2022-01-31 DIAGNOSIS — B351 Tinea unguium: Secondary | ICD-10-CM | POA: Diagnosis not present

## 2022-01-31 DIAGNOSIS — M79671 Pain in right foot: Secondary | ICD-10-CM

## 2022-01-31 NOTE — Telephone Encounter (Signed)
Noted thanks-glad he has been scheduled for follow-up/TCM

## 2022-01-31 NOTE — Patient Instructions (Signed)
Diabetes Mellitus and Foot Care Foot care is an important part of your health, especially when you have diabetes. Diabetes may cause you to have problems because of poor blood flow (circulation) to your feet and legs, which can cause your skin to: Become thinner and drier. Break more easily. Heal more slowly. Peel and crack. You may also have nerve damage (neuropathy) in your legs and feet, causing decreased feeling in them. This means that you may not notice minor injuries to your feet that could lead to more serious problems. Noticing and addressing any potential problems early is the best way to prevent future foot problems. How to care for your feet Foot hygiene  Wash your feet daily with warm water and mild soap. Do not use hot water. Then, pat your feet and the areas between your toes until they are completely dry. Do not soak your feet as this can dry your skin. Trim your toenails straight across. Do not dig under them or around the cuticle. File the edges of your nails with an emery board or nail file. Apply a moisturizing lotion or petroleum jelly to the skin on your feet and to dry, brittle toenails. Use lotion that does not contain alcohol and is unscented. Do not apply lotion between your toes. Shoes and socks Wear clean socks or stockings every day. Make sure they are not too tight. Do not wear knee-high stockings since they may decrease blood flow to your legs. Wear shoes that fit properly and have enough cushioning. Always look in your shoes before you put them on to be sure there are no objects inside. To break in new shoes, wear them for just a few hours a day. This prevents injuries on your feet. Wounds, scrapes, corns, and calluses  Check your feet daily for blisters, cuts, bruises, sores, and redness. If you cannot see the bottom of your feet, use a mirror or ask someone for help. Do not cut corns or calluses or try to remove them with medicine. If you find a minor scrape,  cut, or break in the skin on your feet, keep it and the skin around it clean and dry. You may clean these areas with mild soap and water. Do not clean the area with peroxide, alcohol, or iodine. If you have a wound, scrape, corn, or callus on your foot, look at it several times a day to make sure it is healing and not infected. Check for: Redness, swelling, or pain. Fluid or blood. Warmth. Pus or a bad smell. General tips Do not cross your legs. This may decrease blood flow to your feet. Do not use heating pads or hot water bottles on your feet. They may burn your skin. If you have lost feeling in your feet or legs, you may not know this is happening until it is too late. Protect your feet from hot and cold by wearing shoes, such as at the beach or on hot pavement. Schedule a complete foot exam at least once a year (annually) or more often if you have foot problems. Report any cuts, sores, or bruises to your health care provider immediately. Where to find more information American Diabetes Association: www.diabetes.org Association of Diabetes Care & Education Specialists: www.diabeteseducator.org Contact a health care provider if: You have a medical condition that increases your risk of infection and you have any cuts, sores, or bruises on your feet. You have an injury that is not healing. You have redness on your legs or feet. You   feel burning or tingling in your legs or feet. You have pain or cramps in your legs and feet. Your legs or feet are numb. Your feet always feel cold. You have pain around any toenails. Get help right away if: You have a wound, scrape, corn, or callus on your foot and: You have pain, swelling, or redness that gets worse. You have fluid or blood coming from the wound, scrape, corn, or callus. Your wound, scrape, corn, or callus feels warm to the touch. You have pus or a bad smell coming from the wound, scrape, corn, or callus. You have a fever. You have a red  line going up your leg. Summary Check your feet every day for blisters, cuts, bruises, sores, and redness. Apply a moisturizing lotion or petroleum jelly to the skin on your feet and to dry, brittle toenails. Wear shoes that fit properly and have enough cushioning. If you have foot problems, report any cuts, sores, or bruises to your health care provider immediately. Schedule a complete foot exam at least once a year (annually) or more often if you have foot problems. This information is not intended to replace advice given to you by your health care provider. Make sure you discuss any questions you have with your health care provider. Document Revised: 11/07/2019 Document Reviewed: 11/07/2019 Elsevier Patient Education  2023 Elsevier Inc.  

## 2022-01-31 NOTE — Telephone Encounter (Signed)
Transition Care Management Follow-up Telephone Call Date of discharge and from where: [facility] 01/29/22 How have you been since you were released from the hospital? ok Any questions or concerns? No  Items Reviewed: Did the pt receive and understand the discharge instructions provided? Yes  Medications obtained and verified? Yes  Other? No  Any new allergies since your discharge? No  Dietary orders reviewed? Yes Do you have support at home? Yes   Home Care and Equipment/Supplies: Were home health services ordered? not applicable If so, what is the name of the agency?   Has the agency set up a time to come to the patient's home? not applicable Were any new equipment or medical supplies ordered?  No What is the name of the medical supply agency?  Were you able to get the supplies/equipment? not applicable Do you have any questions related to the use of the equipment or supplies? No  Functional Questionnaire: (I = Independent and D = Dependent) ADLs: I  Bathing/Dressing- I  Meal Prep- I  Eating- I  Maintaining continence- I  Transferring/Ambulation- I  Managing Meds- I  Follow up appointments reviewed:  PCP Hospital f/u appt confirmed? Yes  Scheduled to see Dr Yong Channel  on 02/04/22 @ 11:00. [facility] f/u appt confirmed? No  Pt will call for appt 01/31/22 Are transportation arrangements needed? No  If their condition worsens, is the pt aware to call PCP or go to the Emergency Dept.? Yes Was the patient provided with contact information for the PCP's office or ED? Yes Was to pt encouraged to call back with questions or concerns? Yes

## 2022-01-31 NOTE — Telephone Encounter (Signed)
Spoke with patient & his wife regarding OV that is currently scheduled for 03/02/22 with Anderson [place], [place]. They are aware that he will need to come in for labs in two weeks to recheck CBC. They've been advised on when/where to go. Verbalized all understanding.

## 2022-01-31 NOTE — Telephone Encounter (Signed)
Pt stated that [facility] is no longer accepting new pt For Diabetic shoes or inserts. Wants to know where should he go now?  Please Advise

## 2022-01-31 NOTE — Progress Notes (Signed)
Subjective: Chief Complaint  Patient presents with   Nail Problem    Routine foot care,  nail trim, patient is having right pain rate of pain 4 out of 10, ball of the foot, burning, A1c- 7.1 BG- 262 (just got out the hospital on sat) X-Rays done today   81 year old male presents with above concerns.  He states he has had ongoing pain in the ball of his right foot since he had surgery with Dr. March Rummage.  He is also notices big toe turning towards the lesser toes.  No open lesions that he reports.  The nails are thickened elongated she has difficulty trimming them himself.  Last A1c was 7.2 on 11/05/2021   Objective: AAO x3, NAD DP/PT pulses palpable bilaterally, CRT less than 3 seconds Sensation mildly decreased with 720 monofilament Nails are hypertrophic, dystrophic, brittle, discolored, elongated 10. No surrounding redness or drainage. Tenderness nails 1-5 bilaterally.  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  No open lesions or pre-ulcerative lesions are identified today. Prominent metatarsals plantarly on the right side worse than left.  Digital fomites noted of the hallux IPJ as well as the lesser digits with hammertoes. No pain with calf compression, swelling, warmth, erythema  Assessment: 81 y.o. male presents for diabetic foot exam; prominent metatarsal heads, hammertoes, symptomatic onychomycosis  Plan: -All treatment options discussed with the patient including all alternatives, risks, complications.  -Trays obtained reviewed of the right foot.  3 views were obtained.  Previous partial second amputation.  There is deformity is noted.  No evidence of acute fracture, osteomyelitis. -Nails sharply debrided x 10 without complications or bleeding -I do think he benefit from diabetic shoes to help offload.  I was informed that we are not able to do them in the office currently so given a prescription for [facility] for orthotics, diabetic shoes to help offload the  metatarsal heads.  I dispensed a gel metatarsal pad in the meantime to help. -Daily foot inspection. -Patient encouraged to call the office with any questions, concerns, change in symptoms.   Trula Slade DPM

## 2022-01-31 NOTE — Telephone Encounter (Signed)
Ladene Artist, MD  Carl Best, RN  See below. Please set up CBC and REV with me or an APP. Thanks.

## 2022-02-01 LAB — SURGICAL PATHOLOGY

## 2022-02-02 ENCOUNTER — Other Ambulatory Visit: Payer: Self-pay

## 2022-02-02 MED ORDER — DOXYCYCLINE HYCLATE 100 MG PO CAPS: 100.0000 mg | ORAL_CAPSULE | Freq: Two times a day (BID) | ORAL | 0 refills | Status: AC

## 2022-02-02 MED ORDER — BISMUTH SUBSALICYLATE 262 MG PO TABS: 524.0000 mg | ORAL_TABLET | Freq: Four times a day (QID) | ORAL | 0 refills | Status: AC

## 2022-02-02 MED ORDER — METRONIDAZOLE 500 MG PO TABS: 500.0000 mg | ORAL_TABLET | Freq: Three times a day (TID) | ORAL | 0 refills | Status: AC

## 2022-02-04 ENCOUNTER — Ambulatory Visit: Payer: Medicare Other | Admitting: Family Medicine

## 2022-02-04 ENCOUNTER — Encounter: Payer: Self-pay | Admitting: Family Medicine

## 2022-02-04 VITALS — BP 118/62 | HR 63 | Temp 98.0°F | Ht 73.0 in | Wt 195.8 lb

## 2022-02-04 DIAGNOSIS — K264 Chronic or unspecified duodenal ulcer with hemorrhage: Secondary | ICD-10-CM | POA: Diagnosis not present

## 2022-02-04 DIAGNOSIS — K862 Cyst of pancreas: Secondary | ICD-10-CM

## 2022-02-04 DIAGNOSIS — E119 Type 2 diabetes mellitus without complications: Secondary | ICD-10-CM | POA: Diagnosis not present

## 2022-02-04 DIAGNOSIS — Z23 Encounter for immunization: Secondary | ICD-10-CM

## 2022-02-04 DIAGNOSIS — I10 Essential (primary) hypertension: Secondary | ICD-10-CM

## 2022-02-04 DIAGNOSIS — E785 Hyperlipidemia, unspecified: Secondary | ICD-10-CM

## 2022-02-04 DIAGNOSIS — R911 Solitary pulmonary nodule: Secondary | ICD-10-CM

## 2022-02-04 DIAGNOSIS — E1169 Type 2 diabetes mellitus with other specified complication: Secondary | ICD-10-CM

## 2022-02-04 DIAGNOSIS — G3184 Mild cognitive impairment, so stated: Secondary | ICD-10-CM

## 2022-02-04 DIAGNOSIS — D649 Anemia, unspecified: Secondary | ICD-10-CM | POA: Diagnosis not present

## 2022-02-04 HISTORY — DX: Solitary pulmonary nodule: R91.1

## 2022-02-04 HISTORY — DX: Cyst of pancreas: K86.2

## 2022-02-04 LAB — CBC WITH DIFFERENTIAL/PLATELET
Basophils Absolute: 0 10*3/uL (ref 0.0–0.1)
Basophils Relative: 0.4 % (ref 0.0–3.0)
Eosinophils Absolute: 0.1 10*3/uL (ref 0.0–0.7)
Eosinophils Relative: 1.5 % (ref 0.0–5.0)
HCT: 30.1 % — ABNORMAL LOW (ref 39.0–52.0)
Hemoglobin: 10.1 g/dL — ABNORMAL LOW (ref 13.0–17.0)
Lymphocytes Relative: 9.9 % — ABNORMAL LOW (ref 12.0–46.0)
Lymphs Abs: 0.8 10*3/uL (ref 0.7–4.0)
MCHC: 33.5 g/dL (ref 30.0–36.0)
MCV: 93.5 fl (ref 78.0–100.0)
Monocytes Absolute: 0.5 10*3/uL (ref 0.1–1.0)
Monocytes Relative: 6.5 % (ref 3.0–12.0)
Neutro Abs: 6.8 10*3/uL (ref 1.4–7.7)
Neutrophils Relative %: 81.7 % — ABNORMAL HIGH (ref 43.0–77.0)
Platelets: 306 10*3/uL (ref 150.0–400.0)
RBC: 3.22 Mil/uL — ABNORMAL LOW (ref 4.22–5.81)
RDW: 18.2 % — ABNORMAL HIGH (ref 11.5–15.5)
WBC: 8.3 10*3/uL (ref 4.0–10.5)

## 2022-02-04 LAB — COMPREHENSIVE METABOLIC PANEL WITH GFR
ALT: 21 U/L (ref 0–53)
AST: 15 U/L (ref 0–37)
Albumin: 3.9 g/dL (ref 3.5–5.2)
Alkaline Phosphatase: 73 U/L (ref 39–117)
BUN: 9 mg/dL (ref 6–23)
CO2: 27 meq/L (ref 19–32)
Calcium: 9.4 mg/dL (ref 8.4–10.5)
Chloride: 102 meq/L (ref 96–112)
Creatinine, Ser: 0.96 mg/dL (ref 0.40–1.50)
GFR: 74.28 mL/min
Glucose, Bld: 215 mg/dL — ABNORMAL HIGH (ref 70–99)
Potassium: 4.6 meq/L (ref 3.5–5.1)
Sodium: 138 meq/L (ref 135–145)
Total Bilirubin: 0.4 mg/dL (ref 0.2–1.2)
Total Protein: 6.6 g/dL (ref 6.0–8.3)

## 2022-02-04 LAB — MICROALBUMIN / CREATININE URINE RATIO
Creatinine,U: 130.9 mg/dL
Microalb Creat Ratio: 2.8 mg/g (ref 0.0–30.0)
Microalb, Ur: 3.7 mg/dL — ABNORMAL HIGH (ref 0.0–1.9)

## 2022-02-04 MED ORDER — GVOKE HYPOPEN 2-PACK 0.5 MG/0.1ML ~~LOC~~ SOAJ: 0.5000 mg | Freq: Every day | SUBCUTANEOUS | 1 refills | Status: AC | PRN

## 2022-02-04 NOTE — Assessment & Plan Note (Signed)
LDL has been reasonably well controlled with rosuvastatin 20 mg once a week under 70- we will recheck full lipid panel at next visit-for now continue current medication

## 2022-02-04 NOTE — Assessment & Plan Note (Signed)
Well-controlled on amlodipine 5 mg and telmisartan 40 mg-continue current medication

## 2022-02-04 NOTE — Assessment & Plan Note (Signed)
13 mm nodule on CT angio and PET scan was recommended by radiology-this was ordered today.  Has had some cough in recent weeks but I wonder if some of this could be reflux related as improved in the hospital on high-dose PPI and better after PPI placement-we will monitor

## 2022-02-04 NOTE — Patient Instructions (Addendum)
Stop by lab for urine   We will call you within two weeks about your referral for pet scan of nodule. If you do not hear within 2 weeks, give [place] a call.   No changes in meds today- finish antibiotics  Recommended follow up: Return for next already scheduled visit or sooner if needed.

## 2022-02-04 NOTE — Assessment & Plan Note (Signed)
Patient with severe anemia with hemoglobin down to 6.5 from reading near 14 last year.  Duodenal ulcer bleed was treated with cautery, endoscopic clip as well as hemostatic spray and repeat EGD 2 days later without active bleeding noted.  On high-dose PPI twice daily at this point-he will continue this at least through GI follow-up and likely long-term.  Later noted to be H. pylori positive and started on doxycycline, bismuth subsalicylate, metronidazole-he is tolerating essentially complete therapy - We discussed importance of keeping GI follow-up which we confirmed with scheduled on November 1 - Also opted to pursue CBC to evaluate anemia trend.-I asked him to have some caution until CBC at least back over 12 for hemoglobin - Continue iron but hold off on iron studies this early unless GI recommends

## 2022-02-04 NOTE — Assessment & Plan Note (Signed)
A1c has been mildly poorly controlled but improving-too early for repeat but for now continue metformin 1000 mg twice daily, Januvia 100 mg, glipizide 10 mg twice daily - We will try to get him a gvoke glucagon pen and discussed hypoglycemia symptoms with his wife-thankfully has not had many issues but with glipizide I think it is reasonable to have this on hand Lab Results  Component Value Date   HGBA1C 7.2 (A) 11/05/2021   HGBA1C 7.4 (H) 07/02/2021   HGBA1C 8.3 (H) 03/02/2021

## 2022-02-04 NOTE — Assessment & Plan Note (Signed)
I think there is some interplay with him not reporting symptoms and underlying mild neurocognitive disorder-I strongly encouraged him to consult with wife if he has symptoms that he cannot fully explain such as the dark stools which he waited several days before expressing to his wife

## 2022-02-04 NOTE — Assessment & Plan Note (Signed)
From CT 01/26/22 "3.8 cm cystic abnormality is seen in pancreatic head. 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 Indolent neoplasm, such as intraductal papillary mucinous tumor could look similar. Per consensus criteria, follow-up with preferably pre and post contrast abdominal MRI at 1 year is recommended. This recommendation follows ACR consensus guidelines: Managing Incidental Findings on Abdominal CT: White Paper of the ACR Incidental Findings Committee. J Am Coll Radiol 2010;7:754-773." -We discussed repeating in 1 year

## 2022-02-04 NOTE — Progress Notes (Signed)
Phone [phone]   Subjective:  Walter [name] is a 81 y.o. year old very pleasant male patient who presents for transitional care management and hospital follow up for upper GI bleed/duodenal ulcer bleeding related H. pylori. Patient was hospitalized from 01/26/2022 to 01/29/2022. A TCM phone call was completed on 01/31/2022. Medical complexity moderate  Patient presented to the hospital on 01/26/2022 with black tarry stools/melena (started Sunday a few days prior and thought could have been something he ate) and was found to be symptomatic for anemia-ultimately diagnosed with upper GI bleed-had a urgent upper endoscopy on 927 revealing bleeding duodenal ulcer.  For upper GI bleed/duodenal ulcer-EGD showed significant clotted blood in the stomach with an oozing duodenal ulcer with blood in the duodenal bulb-treated with cautery Endo Clip as well as hemostatic spray.  Had repeat endoscopy on 01/28/2022 without evidence of active bleeding.  He was given IV iron prior to discharge and discharged home on iron-ferrous sulfate 325 mg twice daily and Protonix 40 mg twice daily and advised to avoid NSAIDs and follow-up outpatient with GI.  Later biopsy did show H. pylori and patient was appropriately started on doxycycline and metronidazole as well as bismuth.  For acute blood loss anemia hemoglobin as low as 6.5 and required a total of 4 units of packed red blood cells.  Hemoglobin was 8.5 on day of discharge with plan for 2-week repeat once discharged  Chronic conditions - Hypertension was maintained on amlodipine and telmisartan 40 mg - Diabetes controlled reasonably well accounting for age with A1c of 7.2-he was discharged back on metformin, Januvia, glipizide - Hyperlipidemia reasonably controlled on Crestor and was maintained on current dose - Patient compliant with CPAP for OSA - Mild cognitive impairment continued on Aricept-no worsening during hospitalization   Today, reports no dark stools since  getting home. Fatigue has improved. Taking some short walks and doing ok. No chest pain or shortness of breath. Late rdiscovered that this was related to H. Pylori- started on doxycycline and metronidazole for 14 days per GI. Had been having some reflux symptoms in retrospect prior to hospitalization- was avoiding hot foods more and had more gas.  Baby aspirin was taking prior (we had previously stopped and he opted to restart)- and about 2 months ago had been using advil then opted to take aspirin for maintenance for back pain. Tylenol only -no cough in hospital - very mild at home- going on for a few months- could be reflux.   -Incidental pulmonary nodule noted with PET scan recommended  - Pancreatic cyst noted as well with plan for 1 year repeat imaging    See problem oriented charting as well  Past Medical History-  Patient Active Problem List   Diagnosis Date Noted   Acute upper GI bleeding 01/26/2022    Priority: High   Duodenal ulcer hemorrhage     Priority: High   Mild neurocognitive disorder 12/23/2019    Priority: High   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    Priority: High   BPH (benign prostatic hyperplasia) 07/02/2015    Priority: High   Diabetes mellitus type II, controlled 04/23/2007    Priority: High   History of colon cancer 01/27/2014    Priority: Medium    Hyperlipidemia associated with type 2 diabetes mellitus 04/23/2007    Priority: Medium    Obstructive sleep apnea 04/23/2007    Priority: Medium    Essential hypertension 04/23/2007    Priority: Medium    Allergic rhinitis 08/19/2019  Priority: Low   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    Priority: Low   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    Priority: Low   Hallux valgus of right foot 03/26/2014    Priority: Low   Iritis 12/17/2008    Priority: Low   Postherpetic neuralgia 05/22/2008    Priority: Low   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 Current Outpatient Medications  Medication Sig Dispense Refill   amLODipine (NORVASC) 5 MG tablet TAKE 1 TABLET BY MOUTH EVERY DAY (Patient taking differently: Take 5 mg by mouth daily.) 90 tablet 3   Bismuth Subsalicylate 967 MG TABS Take 2 tablets (524 mg total) by mouth in the morning, at noon, in the evening, and at bedtime for 14 days. 112 tablet 0   donepezil (ARICEPT) 10 MG tablet TAKE 1 TABLET BY MOUTH EVERYDAY AT BEDTIME (Patient taking differently: Take 10 mg by mouth at bedtime.) 90 tablet 3   doxycycline (VIBRAMYCIN) 100 MG capsule Take 1 capsule (100 mg total) by mouth 2 (two) times daily for 14 days. 28 capsule 0   ferrous sulfate 325 (65 FE) MG tablet Take 1 tablet (325 mg total) by mouth 2 (two) times daily with a meal.     glipiZIDE (GLUCOTROL) 10 MG tablet TAKE 1 TABLET (10 MG TOTAL) BY MOUTH 2 (TWO) TIMES DAILY BEFORE A MEAL. 180 tablet 3   JANUVIA 100 MG tablet TAKE 1 TABLET BY MOUTH EVERY DAY (Patient taking differently: Take 100 mg by mouth daily.) 90 tablet 1   Lutein 20 MG TABS Take 20 mg by mouth daily.     metFORMIN (GLUCOPHAGE) 1000 MG tablet TAKE 1 TABLET BY MOUTH 2 TIMES DAILY WITH A MEAL. (Patient taking differently: Take 1,000 mg by mouth 2 (two) times daily with a meal.) 180 tablet 3   metroNIDAZOLE (FLAGYL) 500 MG tablet Take 1 tablet (500 mg total) by mouth 3 (three) times daily for 14 days. 42 tablet 0   Multiple Vitamins-Minerals (CENTRUM SILVER PO) Take 1 tablet by mouth daily.      ONETOUCH ULTRA test strip USE ONCE DAILY AS INSTRUCTED 100 strip 3   pantoprazole (PROTONIX) 40 MG tablet Take 1 tablet (40 mg total) by mouth 2 (two) times daily. 60 tablet 2   rosuvastatin (CRESTOR) 20 MG tablet TAKE 1 TABLET BY MOUTH ONE TIME PER WEEK  (Patient taking differently: Take 20 mg by mouth once a week.) 13 tablet 3   telmisartan (MICARDIS) 40 MG tablet TAKE 1 TABLET BY MOUTH EVERY DAY (Patient taking differently: Take 40 mg by mouth daily.) 90 tablet 3   VITAMIN A PO Take 1 tablet by mouth daily.     No current facility-administered medications for this visit.   Objective  Objective:  BP 118/62   Pulse 63   Temp 98 F (36.7 C)   Ht '6\' 1"'$  (1.854 m)   Wt 195 lb 12.8 oz (88.8 kg)   SpO2 96%   BMI 25.83 kg/m  Gen: NAD, resting comfortably CV: RRR no murmurs rubs or gallops Lungs: CTAB no crackles, wheeze, rhonchi Abdomen: soft/nontender/nondistended/normal bowel sounds.  Ext: 1+ edema Skin: warm, dry Neuro: grossly normal, moves all extremities     Assessment and Plan:  Transitional care management/hospital follow-up  Duodenal ulcer hemorrhage Patient with severe anemia with hemoglobin down to 6.5 from reading near 14 last year.  Duodenal ulcer bleed was treated with cautery, endoscopic clip as well as hemostatic spray and repeat EGD 2 days later without active bleeding noted.  On high-dose PPI twice daily at this point-he will continue this at least through GI follow-up and likely long-term.  Later noted to be H. pylori positive and started on doxycycline, bismuth subsalicylate, metronidazole-he is tolerating essentially complete therapy - We discussed importance of keeping GI follow-up which we confirmed with scheduled on November 1 - Also opted to pursue CBC to evaluate anemia trend.-I asked him to have some caution until CBC at least back over 12 for hemoglobin - Continue iron but hold off on iron studies this early unless GI recommends  Mild neurocognitive disorder I think there is some interplay with him not reporting symptoms and underlying mild neurocognitive disorder-I strongly encouraged him to consult with wife if he has symptoms that he cannot fully explain such as the dark stools which he waited several  days before expressing to his wife  Diabetes mellitus type II, controlled  A1c has been mildly poorly controlled but improving-too early for repeat but for now continue metformin 1000 mg twice daily, Januvia 100 mg, glipizide 10 mg twice daily - We will try to get him a gvoke glucagon pen and discussed hypoglycemia symptoms with his wife-thankfully has not had many issues but with glipizide I think it is reasonable to have this on hand Lab Results  Component Value Date   HGBA1C 7.2 (A) 11/05/2021   HGBA1C 7.4 (H) 07/02/2021   HGBA1C 8.3 (H) 03/02/2021     Hyperlipidemia associated with type 2 diabetes mellitus LDL has been reasonably well controlled with rosuvastatin 20 mg once a week under 70- we will recheck full lipid panel at next visit-for now continue current medication  Essential hypertension Well-controlled on amlodipine 5 mg and telmisartan 40 mg-continue current medication  Pulmonary nodule 13 mm nodule on CT angio and PET scan was recommended by radiology-this was ordered today.  Has had some cough in recent weeks but I wonder if some of this could be reflux related as improved in the hospital on high-dose PPI and better after PPI placement-we will monitor  Pancreatic cyst From CT 01/26/22 "3.8 cm cystic abnormality is seen in pancreatic head. 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 Indolent neoplasm, such as intraductal papillary mucinous tumor could look similar. Per consensus criteria, follow-up with preferably pre and post contrast abdominal MRI at 1 year is recommended. This recommendation follows ACR consensus guidelines: Managing Incidental Findings on Abdominal CT: White Paper of the ACR Incidental Findings Committee. J Am Coll Radiol 4098;1:191-478." -We discussed repeating in 1 year    Duodenal ulcer hemorrhage Patient with severe anemia with hemoglobin down to 6.5 from reading near 14 last year.  Duodenal ulcer bleed was treated with cautery, endoscopic clip as well as  hemostatic spray and repeat EGD 2 days later without active bleeding noted.  On high-dose PPI twice daily at this point-he will continue this at least through GI follow-up and likely long-term.  Later noted to be H. pylori positive and started on doxycycline, bismuth subsalicylate, metronidazole-he is tolerating essentially complete therapy - We discussed importance of keeping GI follow-up which we confirmed with scheduled on November 1 - Also opted to pursue CBC to evaluate anemia trend.-I asked him to have some caution until CBC at least back over 12  for hemoglobin - Continue iron but hold off on iron studies this early unless GI recommends  Mild neurocognitive disorder I think there is some interplay with him not reporting symptoms and underlying mild neurocognitive disorder-I strongly encouraged him to consult with wife if he has symptoms that he cannot fully explain such as the dark stools which he waited several days before expressing to his wife  Diabetes mellitus type II, controlled  A1c has been mildly poorly controlled but improving-too early for repeat but for now continue metformin 1000 mg twice daily, Januvia 100 mg, glipizide 10 mg twice daily - We will try to get him a gvoke glucagon pen and discussed hypoglycemia symptoms with his wife-thankfully has not had many issues but with glipizide I think it is reasonable to have this on hand Lab Results  Component Value Date   HGBA1C 7.2 (A) 11/05/2021   HGBA1C 7.4 (H) 07/02/2021   HGBA1C 8.3 (H) 03/02/2021     Hyperlipidemia associated with type 2 diabetes mellitus LDL has been reasonably well controlled with rosuvastatin 20 mg once a week under 70- we will recheck full lipid panel at next visit-for now continue current medication  Essential hypertension Well-controlled on amlodipine 5 mg and telmisartan 40 mg-continue current medication  Pulmonary nodule 13 mm nodule on CT angio and PET scan was recommended by radiology-this was  ordered today.  Has had some cough in recent weeks but I wonder if some of this could be reflux related as improved in the hospital on high-dose PPI and better after PPI placement-we will monitor  Pancreatic cyst From CT 01/26/22 "3.8 cm cystic abnormality is seen in pancreatic head. 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 Indolent neoplasm, such as intraductal papillary mucinous tumor could look similar. Per consensus criteria, follow-up with preferably pre and post contrast abdominal MRI at 1 year is recommended. This recommendation follows ACR consensus guidelines: Managing Incidental Findings on Abdominal CT: White Paper of the ACR Incidental Findings Committee. J Am Coll Radiol 2010;7:754-773." -We discussed repeating in 1 year  Recommended follow up: Return for next already scheduled visit or sooner if needed. Future Appointments  Date Time Provider [place]  03/02/2022  2:00 PM Levin Erp, [place] [facility]  03/07/2022  1:00 PM Rondel Jumbo, PA-C [facility] None  04/22/2022  2:00 PM [name], MD [facility]  05/03/2022  8:15 AM Trula Slade, DPM [facility]  05/12/2022 10:15 AM [facility] HEALTH COACH [facility]    Lab/Order associations:   ICD-10-CM   1. Need for immunization against influenza  Z23 Flu Vaccine QUAD High Dose(Fluad)    2. Essential hypertension  I10 CBC with Differential/Platelet    Comprehensive metabolic panel    3. Anemia, unspecified type  D64.9 CBC with Differential/Platelet    4. Controlled type 2 diabetes mellitus without complication, without long-term current use of insulin (HCC)  E11.9 Microalbumin / creatinine urine ratio    5. Pulmonary nodule  R91.1 NM PET Image Initial (PI) Skull Base To Thigh (F-18 FDG)    6. Duodenal ulcer hemorrhage  K26.4     7. Mild neurocognitive disorder  G31.84     8. Hyperlipidemia associated with type 2 diabetes mellitus  E11.69    E78.5     9. Pancreatic cyst  K86.2       No  orders of the defined types were placed in this encounter.   Time Spent: 46 minutes of total time (1:45 PM-12:15 PM, 12:40 PM- 12:56 PM) was spent  on the date of the encounter performing the following actions: chart review prior to seeing the patient, obtaining history, performing a medically necessary exam, counseling on the treatment plan as well as seriousness of his recent health condition and warning signs of recurrent, placing orders, and documenting in our EHR.    Return precautions advised.  [name], MD

## 2022-02-07 ENCOUNTER — Other Ambulatory Visit: Payer: Self-pay | Admitting: Family Medicine

## 2022-02-11 ENCOUNTER — Encounter: Payer: Self-pay | Admitting: Family Medicine

## 2022-02-13 ENCOUNTER — Other Ambulatory Visit: Payer: Self-pay | Admitting: Gastroenterology

## 2022-02-18 ENCOUNTER — Other Ambulatory Visit: Payer: Self-pay | Admitting: Podiatry

## 2022-02-18 DIAGNOSIS — M2031 Hallux varus (acquired), right foot: Secondary | ICD-10-CM

## 2022-03-02 ENCOUNTER — Encounter: Payer: Self-pay | Admitting: Physician Assistant

## 2022-03-02 ENCOUNTER — Other Ambulatory Visit: Payer: Medicare Other

## 2022-03-02 ENCOUNTER — Ambulatory Visit: Payer: Medicare Other | Admitting: Physician Assistant

## 2022-03-02 VITALS — BP 114/60 | HR 80 | Ht 70.0 in | Wt 195.0 lb

## 2022-03-02 DIAGNOSIS — D62 Acute posthemorrhagic anemia: Secondary | ICD-10-CM | POA: Diagnosis not present

## 2022-03-02 DIAGNOSIS — A048 Other specified bacterial intestinal infections: Secondary | ICD-10-CM

## 2022-03-02 DIAGNOSIS — K922 Gastrointestinal hemorrhage, unspecified: Secondary | ICD-10-CM | POA: Diagnosis not present

## 2022-03-02 DIAGNOSIS — K264 Chronic or unspecified duodenal ulcer with hemorrhage: Secondary | ICD-10-CM | POA: Diagnosis not present

## 2022-03-02 NOTE — Progress Notes (Signed)
Chief Complaint: Hospital follow-up for GI bleed  HPI:    Walter [name] is an 81 year old male with a past medical history as listed below, known to Dr. Fuller Plan, who was referred to me by [name], MD for low up after being seen in the hospital for GI bleed.    01/26/2022-01/29/2022 patient followed by her service for an upper GI bleed.  01/26/2022 EGD with small hiatal hernia, clotted blood in the entire stomach and blood in the duodenal bulb, oozing duodenal ulcer with visible vessel injected and treated with bipolar cautery and clipped as well as hemostatic spray applied.  Placed on Protonix drip.  01/29/2019 3 repeat EGD with nonbleeding gastric ulcer, nonbleeding duodenal ulcer.  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 pylori infection.  Patient started on quadruple therapy.  Patient discharged on iron twice daily.    02/04/2022 CBC with a hemoglobin of 10.1 (nadir of 6.5 in the hospital 01/27/2022).    Today, the patient tells me that he finished all of his antibiotics for H. pylori a couple of weeks ago and he has felt great.  Prior to going to the hospital he was severely fatigued and unable to walk even short distances without having to stop and take a break.  Also discusses some reflux which he had for about a month prior to being seen in the hospital.  Since finishing his antibiotics he has had no further symptoms.  He is currently off of all of the medications including Pantoprazole.  He is doing very well.  He and his wife do have some questions about his hospital course and how to prevent things in the future. No reflux symptoms.    Denies fever, chills, weight loss, change in bowel habits or abdominal pain.  Past Medical History:  Diagnosis Date   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   S/p 2nd ray amputation 2018- started after prolonged use of corn pad   Allergic rhinitis 08/19/2019   flonase   Anemia    BPH (benign  prostatic hyperplasia) 07/02/2015   S/p TURP with multiple complications afterwards including recurrent hospitalizations. All started after a hemorrhoid surgery and later with urinary retention. Patient at one point was septic from urinary issues.     Diabetes mellitus type II, controlled 04/23/2007    Metformin 1g BID, amaryl '4mg'$ --> '8mg'$ , had to add [place] back  Never took victoza.   Januvia-lethargic and dizzy in past. 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   History of colon cancer    Tubular adenoma of colon; s/p colectomy 1992    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   History of shingles    Hyperlipidemia associated with type 2 diabetes mellitus 04/23/2007   Atorvastatin '20mg'$  once weekly     Hypertension associated with diabetes 04/23/2007   Amlodipine '5mg'$ , telmisartan '40mg'$      Iritis 12/17/2008   Shingles 2010    Mild neurocognitive disorder 12/23/2019   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   Obstructive sleep apnea 04/23/2007   uses CPAP nightly   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   Sepsis secondary to UTI 03/29/2015    Past Surgical History:  Procedure Laterality Date   AMPUTATION TOE Right 09/14/2016   Procedure: RIGHT 2ND TOE/RAY AMPUTATION;  Surgeon: Leandrew Koyanagi, MD;  Location: [place];  Service: Orthopedics;  Laterality: Right;   APPENDECTOMY  05/03/1947   BIOPSY  01/28/2022   Procedure: BIOPSY;  Surgeon: Doran Stabler, MD;  Location: [place];  Service: Gastroenterology;;   COLECTOMY  05/02/1990   CYSTOSCOPY N/A 06/21/2015   Procedure: CYSTOSCOPY, CLOT EVACUATION, AND CAUTERIZATION OF PROSTATE FOSSA;  Surgeon: Carolan Clines, MD;  Location: [facility];  Service: Urology;  Laterality: N/A;   ESOPHAGOGASTRODUODENOSCOPY (EGD) WITH PROPOFOL N/A 01/26/2022   Procedure:  ESOPHAGOGASTRODUODENOSCOPY (EGD) WITH PROPOFOL;  Surgeon: Doran Stabler, MD;  Location: [place];  Service: Gastroenterology;  Laterality: N/A;   ESOPHAGOGASTRODUODENOSCOPY (EGD) WITH PROPOFOL N/A 01/28/2022   Procedure: ESOPHAGOGASTRODUODENOSCOPY (EGD) WITH PROPOFOL;  Surgeon: Doran Stabler, MD;  Location: [place];  Service: Gastroenterology;  Laterality: N/A;   HEMORRHOID SURGERY     HEMOSTASIS CLIP PLACEMENT  01/26/2022   Procedure: HEMOSTASIS CLIP PLACEMENT;  Surgeon: Doran Stabler, MD;  Location: [place];  Service: Gastroenterology;;   HEMOSTASIS CONTROL  01/26/2022   Procedure: HEMOSTASIS CONTROL;  Surgeon: Doran Stabler, MD;  Location: [place];  Service: Gastroenterology;;   HOT HEMOSTASIS N/A 01/26/2022   Procedure: HOT HEMOSTASIS (ARGON PLASMA COAGULATION/BICAP);  Surgeon: Doran Stabler, MD;  Location: [place];  Service: Gastroenterology;  Laterality: N/A;   MOLE REMOVAL  2022   hand   SCLEROTHERAPY  01/26/2022   Procedure: SCLEROTHERAPY;  Surgeon: Doran Stabler, MD;  Location: [facility];  Service: Gastroenterology;;   TOE SURGERY  04/2020   TRANSURETHRAL RESECTION OF PROSTATE N/A 05/23/2015   Procedure: TRANSURETHRAL RESECTION OF THE PROSTATE (TURP);  Surgeon: Irine Seal, MD;  Location: [facility];  Service: Urology;  Laterality: N/A;    Current Outpatient Medications  Medication Sig Dispense Refill   amLODipine (NORVASC) 5 MG tablet TAKE 1 TABLET BY MOUTH EVERY DAY (Patient taking differently: Take 5 mg by mouth daily.) 90 tablet 3   [employer] STOMACH RELIEF 262 MG chewable tablet Chew 2 tablets by mouth 4 (four) times daily.     donepezil (ARICEPT) 10 MG tablet TAKE 1 TABLET BY MOUTH EVERYDAY AT BEDTIME (Patient taking differently: Take 10 mg by mouth at bedtime.) 90 tablet 3   glipiZIDE (GLUCOTROL) 10 MG tablet TAKE 1 TABLET (10 MG TOTAL) BY MOUTH 2 (TWO) TIMES DAILY BEFORE A MEAL. 180 tablet 3   Glucagon (GVOKE HYPOPEN 2-PACK) 0.5  MG/0.1ML SOAJ Inject 0.5 mg into the skin daily as needed (for low blood sugar). 0.2 mL 1   JANUVIA 100 MG tablet TAKE 1 TABLET BY MOUTH EVERY DAY (Patient taking differently: Take 100 mg by mouth daily.) 90 tablet 1   Lutein 20 MG TABS Take 20 mg by mouth daily.     metFORMIN (GLUCOPHAGE) 1000 MG tablet TAKE 1 TABLET BY MOUTH 2 TIMES DAILY WITH A MEAL. (Patient taking differently: Take 1,000 mg by mouth 2 (two) times daily with a meal.) 180 tablet 3   Multiple Vitamins-Minerals (CENTRUM SILVER PO) Take 1 tablet by mouth daily.      ONETOUCH ULTRA test strip USE ONCE DAILY AS INSTRUCTED 100 strip 3   rosuvastatin (CRESTOR) 20 MG tablet TAKE 1 TABLET BY MOUTH ONE TIME PER WEEK (Patient taking differently: Take 20 mg by mouth once a week.) 13 tablet 3   telmisartan (MICARDIS) 40 MG tablet TAKE 1 TABLET BY MOUTH EVERY DAY 90 tablet 3   VITAMIN A PO Take 1 tablet by mouth daily.  ferrous sulfate 325 (65 FE) MG tablet Take 1 tablet (325 mg total) by mouth 2 (two) times daily with a meal. (Patient not taking: Reported on 03/02/2022)     pantoprazole (PROTONIX) 40 MG tablet Take 1 tablet (40 mg total) by mouth 2 (two) times daily. (Patient not taking: Reported on 03/02/2022) 60 tablet 2   No current facility-administered medications for this visit.    Allergies as of 03/02/2022 - Review Complete 03/02/2022  Allergen Reaction Noted   Bactrim [sulfamethoxazole-trimethoprim] Nausea And Vomiting 01/11/2016    Family History  Problem Relation Age of Onset   Alzheimer's disease Mother    Colon cancer Father 60   Diabetes Father        type I   Colon cancer Paternal Grandmother 85   Rectal cancer Maternal Uncle    Cancer Other        colon/fhx   Esophageal cancer Neg Hx    Stomach cancer Neg Hx     Social History   Socioeconomic History   Marital status: Married    Spouse name: Not on file   Number of children: 2   Years of education: 18   Highest education level: Master's degree (e.g.,  MA, MS, MEng, MEd, MSW, MBA)  Occupational History   Occupation: Retired  Tobacco Use   Smoking status: Former    Packs/day: 0.25    Years: 10.00    Total pack years: 2.50    Types: Pipe, Cigarettes   Smokeless tobacco: Never  Vaping Use   Vaping Use: Never used  Substance and Sexual Activity   Alcohol use: Yes    Alcohol/week: 2.0 standard drinks of alcohol    Types: 2 Cans of beer per week    Comment: social   Drug use: No   Sexual activity: Not Currently  Other Topics Concern   Not on file  Social History Narrative   Married 1966. 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)      No religious beliefs affecting health care, no afterlife beliefs      Right Handed      Two Story Home   Social Determinants of Health   Financial Resource Strain: Low Risk  (05/04/2021)   Overall Financial Resource Strain (CARDIA)    Difficulty of Paying Living Expenses: Not hard at all  Food Insecurity: No Food Insecurity (01/27/2022)   Hunger Vital Sign    Worried About Running Out of Food in the Last Year: Never true    Ran Out of Food in the Last Year: Never true  Transportation Needs: No Transportation Needs (01/27/2022)   PRAPARE - Hydrologist (Medical): No    Lack of Transportation (Non-Medical): No  Physical Activity: Inactive (05/04/2021)   Exercise Vital Sign    Days of Exercise per Week: 0 days    Minutes of Exercise per Session: 0 min  Stress: No Stress Concern Present (05/04/2021)   [place]    Feeling of Stress : Not at all  Social Connections: Moderately Integrated (05/04/2021)   Social Connection and Isolation Panel [NHANES]    Frequency of  Communication with Friends and Family: Twice a week  Frequency of Social Gatherings with Friends and Family: More than three times a week    Attends Religious Services: Never    Marine scientist or Organizations: Yes    Attends Archivist Meetings: 1 to 4 times per year    Marital Status: Married  Human resources officer Violence: Not At Risk (01/27/2022)   Humiliation, Afraid, Rape, and Kick questionnaire    Fear of Current or Ex-Partner: No    Emotionally Abused: No    Physically Abused: No    Sexually Abused: No    Review of Systems:    Constitutional: No weight loss, fever or chills Cardiovascular: No chest pain Respiratory: No SOB  Gastrointestinal: See HPI and otherwise negative   Physical Exam:  Vital signs: BP 114/60 (BP Location: Left Arm, Patient Position: Sitting, Cuff Size: Normal)   Pulse 80   Ht '5\' 10"'$  (1.778 m) Comment: height measured without shoes  Wt 195 lb (88.5 kg)   BMI 27.98 kg/m    Constitutional:   Pleasant elderly Caucasian male appears to be in NAD, Well developed, Well nourished, alert and cooperative Respiratory: Respirations even and unlabored. Lungs clear to auscultation bilaterally.   No wheezes, crackles, or rhonchi.  Cardiovascular: Normal S1, S2. No MRG. Regular rate and rhythm. No peripheral edema, cyanosis or pallor.  Gastrointestinal:  Soft, nondistended, nontender. No rebound or guarding. Normal bowel sounds. No appreciable masses or hepatomegaly. Psychiatric: Oriented to person, place and time. Demonstrates good judgement and reason without abnormal affect or behaviors.  RELEVANT LABS AND IMAGING: CBC    Component Value Date/Time   WBC 8.3 02/04/2022 1151   RBC 3.22 (L) 02/04/2022 1151   HGB 10.1 (L) 02/04/2022 1151   HCT 30.1 (L) 02/04/2022 1151   PLT 306.0 02/04/2022 1151   MCV 93.5 02/04/2022 1151   MCH 30.5 01/29/2022 0808   MCHC 33.5 02/04/2022 1151   RDW 18.2 (H) 02/04/2022 1151   LYMPHSABS 0.8 02/04/2022 1151    MONOABS 0.5 02/04/2022 1151   EOSABS 0.1 02/04/2022 1151   BASOSABS 0.0 02/04/2022 1151    CMP     Component Value Date/Time   NA 138 02/04/2022 1151   K 4.6 02/04/2022 1151   CL 102 02/04/2022 1151   CO2 27 02/04/2022 1151   GLUCOSE 215 (H) 02/04/2022 1151   BUN 9 02/04/2022 1151   CREATININE 0.96 02/04/2022 1151   CREATININE 1.01 02/20/2020 1013   CALCIUM 9.4 02/04/2022 1151   PROT 6.6 02/04/2022 1151   ALBUMIN 3.9 02/04/2022 1151   AST 15 02/04/2022 1151   ALT 21 02/04/2022 1151   ALKPHOS 73 02/04/2022 1151   BILITOT 0.4 02/04/2022 1151   GFRNONAA >60 01/28/2022 0100   GFRNONAA 70 02/20/2020 1013   GFRAA 82 02/20/2020 1013    Assessment: 1.  H. pylori: With duodenal ulcer, also on high-dose Aspirin at the time of hospitalization, hemoglobin down to 6.5 at admission, now returning back to normal with last check 10/6 at 10.1, patient feeling great, he did use 14 days of quadruple therapy for his H. pylori, no complaints or concerns today 2.  Duodenal ulcer with visible vessel 3.  History of upper GI bleed 4.  Acute blood loss anemia  Plan: 1.  We will check an H. pylori stool antigen in 2 weeks. 2.  We will recheck a CBC and iron studies in a month from now.  Did tell the patient to go ahead and restart his oral iron supplementation just once daily  until then.  Pending results from that testing he may be able to discontinue. 3.  For now he is not having any reflux symptoms.   4.  Discussed the diagnosis of H. pylori etiology and pathophysiology.  Answered all their questions. 5.  Patient does ask about his next colonoscopy.  This would technically be due in 2024, though per Dr. Lynne Leader notes he had aged out.  He would like to be put in recall for this to discuss around that timeframe.  He does have a personal history of colon cancer.  Otherwise patient will follow in clinic as directed after labs above.  Walter Newer, PA-C [facility] 03/02/2022, 2:09  PM  Cc: [name], MD

## 2022-03-02 NOTE — Patient Instructions (Addendum)
_______________________________________________________  If you are age 81 or older, your body mass index should be between 23-30. Your Body mass index is 27.98 kg/m. If this is out of the aforementioned range listed, please consider follow up with your Primary Care Provider.  If you are age 27 or younger, your body mass index should be between 19-25. Your Body mass index is 27.98 kg/m. If this is out of the aformentioned range listed, please consider follow up with your Primary Care Provider.   Your provider has requested that you go to the basement level for lab work before leaving today. Press "B" on the elevator. The lab is located at the first door on the left as you exit the elevator.  Please return on 04/01/22 for blood work. Complete H pylori test in 2 weeks.  The [facility] providers would like to encourage you to use [facility] to communicate with providers for non-urgent requests or questions.  Due to long hold times on the telephone, sending your provider a message by [place] may be a faster and more efficient way to get a response.  Please allow 48 business hours for a response.  Please remember that this is for non-urgent requests.   It was a pleasure to see you today!  Thank you for trusting me with your gastrointestinal care!    Ellouise Newer, PA-C

## 2022-03-07 ENCOUNTER — Ambulatory Visit: Payer: Medicare Other | Admitting: Neurology

## 2022-03-07 ENCOUNTER — Encounter: Payer: Self-pay | Admitting: Physician Assistant

## 2022-03-07 ENCOUNTER — Ambulatory Visit: Payer: Medicare Other | Admitting: Physician Assistant

## 2022-03-07 VITALS — BP 148/69 | HR 75 | Wt 194.0 lb

## 2022-03-07 DIAGNOSIS — G3184 Mild cognitive impairment, so stated: Secondary | ICD-10-CM

## 2022-03-07 NOTE — Patient Instructions (Addendum)
Good to see you.  Continue Donepezil to '10mg'$  daily. 1 tablet daily  2.  Repeat Neuropsych evaluation for clarity of diagnosis   3. Follow-up in 6 months, call for any changes   RECOMMENDATIONS FOR ALL PATIENTS WITH MEMORY PROBLEMS: 1. Continue to exercise (Recommend 30 minutes of walking everyday, or 3 hours every week) 2. Increase social interactions - continue going to [place] and enjoy social gatherings with friends and family 3. Eat healthy, avoid fried foods and eat more fruits and vegetables 4. Maintain adequate blood pressure, blood sugar, and blood cholesterol level. Reducing the risk of stroke and cardiovascular disease also helps promoting better memory. 5. Avoid stressful situations. Live a simple life and avoid aggravations. Organize your time and prepare for the next day in anticipation. 6. Sleep well, avoid any interruptions of sleep and avoid any distractions in the bedroom that may interfere with adequate sleep quality 7. Avoid sugar, avoid sweets as there is a strong link between excessive sugar intake, diabetes, and cognitive impairment We discussed the Mediterranean diet, which has been shown to help patients reduce the risk of progressive memory disorders and reduces cardiovascular risk. This includes eating fish, eat fruits and green leafy vegetables, nuts like almonds and hazelnuts, walnuts, and also use olive oil. Avoid fast foods and fried foods as much as possible. Avoid sweets and sugar as sugar use has been linked to worsening of memory function.  There is always a concern of gradual progression of memory problems. If this is the case, then we may need to adjust level of care according to patient needs. Support, both to the patient and caregiver, should then be put into place.      Mediterranean Diet  Why follow it? Research shows. Those who follow the Mediterranean diet have a reduced risk of heart disease  The diet is associated with a reduced incidence of  Parkinson's and Alzheimer's diseases People following the diet may have longer life expectancies and lower rates of chronic diseases  The Dietary Guidelines for Americans recommends the Mediterranean diet as an eating plan to promote health and prevent disease  What Is the Mediterranean Diet?  Healthy eating plan based on typical foods and recipes of Mediterranean-style cooking The diet is primarily a plant based diet; these foods should make up a majority of meals   Starches - Plant based foods should make up a majority of meals - They are an important sources of vitamins, minerals, energy, antioxidants, and fiber - Choose whole grains, foods high in fiber and minimally processed items  - Typical grain sources include wheat, oats, barley, corn, brown rice, bulgar, farro, millet, polenta, couscous  - Various types of beans include chickpeas, lentils, fava beans, black beans, white beans   Fruits  Veggies - Large quantities of antioxidant rich fruits & veggies; 6 or more servings  - Vegetables can be eaten raw or lightly drizzled with oil and cooked  - Vegetables common to the traditional Mediterranean Diet include: artichokes, arugula, beets, broccoli, brussel sprouts, cabbage, carrots, celery, collard greens, cucumbers, eggplant, kale, leeks, lemons, lettuce, mushrooms, okra, onions, peas, peppers, potatoes, pumpkin, radishes, rutabaga, shallots, spinach, sweet potatoes, turnips, zucchini - Fruits common to the Mediterranean Diet include: apples, apricots, avocados, cherries, clementines, dates, figs, grapefruits, grapes, melons, nectarines, oranges, peaches, pears, pomegranates, strawberries, tangerines  Fats - Replace butter and margarine with healthy oils, such as olive oil, canola oil, and tahini  - Limit nuts to no more than a handful a day  -  Nuts include walnuts, almonds, pecans, pistachios, pine nuts  - Limit or avoid candied, honey roasted or heavily salted nuts - Olives are central to  the Mediterranean diet - can be eaten whole or used in a variety of dishes   Meats Protein - Limiting red meat: no more than a few times a month - When eating red meat: choose lean cuts and keep the portion to the size of deck of cards - Eggs: approx. 0 to 4 times a week  - Fish and lean poultry: at least 2 a week  - Healthy protein sources include, chicken, [place], lean beef, lamb - Increase intake of seafood such as tuna, salmon, trout, mackerel, shrimp, scallops - Avoid or limit high fat processed meats such as sausage and bacon  Dairy - Include moderate amounts of low fat dairy products  - Focus on healthy dairy such as fat free yogurt, skim milk, low or reduced fat cheese - Limit dairy products higher in fat such as whole or 2% milk, cheese, ice cream  Alcohol - Moderate amounts of red wine is ok  - No more than 5 oz daily for women (all ages) and men older than age 17  - No more than 10 oz of wine daily for men younger than 19  Other - Limit sweets and other desserts  - Use herbs and spices instead of salt to flavor foods  - Herbs and spices common to the traditional Mediterranean Diet include: basil, bay leaves, chives, cloves, cumin, fennel, garlic, lavender, marjoram, mint, oregano, parsley, pepper, rosemary, sage, savory, sumac, tarragon, thyme   It's not just a diet, it's a lifestyle:  The Mediterranean diet includes lifestyle factors typical of those in the region  Foods, drinks and meals are best eaten with others and savored Daily physical activity is important for overall good health This could be strenuous exercise like running and aerobics This could also be more leisurely activities such as walking, housework, yard-work, or taking the stairs Moderation is the key; a balanced and healthy diet accommodates most foods and drinks Consider portion sizes and frequency of consumption of certain foods   Meal Ideas & Options:  Breakfast:  Whole wheat toast or whole wheat English  muffins with peanut butter & hard boiled egg Steel cut oats topped with apples & cinnamon and skim milk  Fresh fruit: banana, strawberries, melon, berries, peaches  Smoothies: strawberries, bananas, greek yogurt, peanut butter Low fat greek yogurt with blueberries and granola  Egg white omelet with spinach and mushrooms Breakfast couscous: whole wheat couscous, apricots, skim milk, cranberries  Sandwiches:  Hummus and grilled vegetables (peppers, zucchini, squash) on whole wheat bread   Grilled chicken on whole wheat pita with lettuce, tomatoes, cucumbers or tzatziki  [place] salad on whole wheat bread: tuna salad made with greek yogurt, olives, red peppers, capers, green onions Garlic rosemary lamb pita: lamb sauted with garlic, rosemary, salt & pepper; add lettuce, cucumber, greek yogurt to pita - flavor with lemon juice and black pepper  Seafood:  Mediterranean grilled salmon, seasoned with garlic, basil, parsley, lemon juice and black pepper Shrimp, lemon, and spinach whole-grain pasta salad made with low fat greek yogurt  Seared scallops with lemon orzo  Seared tuna steaks seasoned salt, pepper, coriander topped with tomato mixture of olives, tomatoes, olive oil, minced garlic, parsley, green onions and cappers  Meats:  Herbed greek chicken salad with kalamata olives, cucumber, feta  Red bell peppers stuffed with spinach, bulgur, lean ground beef (or  lentils) & topped with feta   Kebabs: skewers of chicken, tomatoes, onions, zucchini, squash  [place] burgers: made with red onions, mint, dill, lemon juice, feta cheese topped with roasted red peppers Vegetarian Cucumber salad: cucumbers, artichoke hearts, celery, red onion, feta cheese, tossed in olive oil & lemon juice  Hummus and whole grain pita points with a greek salad (lettuce, tomato, feta, olives, cucumbers, red onion) Lentil soup with celery, carrots made with vegetable broth, garlic, salt and pepper  Tabouli salad: parsley,  bulgur, mint, scallions, cucumbers, tomato, radishes, lemon juice, olive oil, salt and pepper.

## 2022-03-07 NOTE — Progress Notes (Signed)
Assessment/Plan:   Mild Cognitive Impairment   Walter [name] is a very pleasant 81 y.o. RH malewith a history of hypertension, hyperlipidemia, diabetes, sleep apnea on CPAP, colon cancer s/p resection, with memory loss. Repeat Neuropsychological evaluation in 11/2020 again showed Mild Cognitive Impairment, etiology still concerning for underlying Alzheimer's disease with vascular contribution, presenting today in follow-up for evaluation of memory loss. Patient is on donepezil 10 mg daily, tolerating well. MMSE on 09/02/21 was 27/30 . He is stable from the cognitive standpoint.    Recommendations:   Follow up in  6 months. Continue donepezil 10 mg daily. Side effects were discussed  Patient scheduled for repeat Neuropsych testing for clarity of diagnosis and disease trajectory   Recommend good control of cardiovascular risk factors.   Continue to control mood as per PCP Consider psychotherapy and couple's therapy      Subjective:   This patient is accompanied in the office by his wife  who supplements the history. Previous records as well as any outside records available were reviewed prior to todays visit.  Last seen 09/02/21.    Any changes in memory since last visit? Since his last visit he reports no word finding difficulties and "he is doing ok with conversations". At home, his wife reports that he tries to explain something but it does not come right that way". He had recent H. Pylori infection and spent 6 days in the hospital, with temporary cognitive difficulties due to polypharmacy, "anesthesia and not being at home". He continue to complete 120 pages of crossword puzzles, reading and he does not watch much TV.  repeats oneself?  Endorsed but " not as much as before " Disoriented when walking into a room?  Patient denies   Leaving objects in unusual places?  Patient denies   Ambulates  with difficulty?   Patient denies. Walks 1 hour a day despite his R 2nd toe amputation in the past.  He likes tennis and golf, but has not been doing much of it. He does some skeet shooting. Recent falls?  Patient denies   Any head injuries?  Patient denies   History of seizures?   Patient denies   Wandering behavior?  Patient denies   Patient drives? No issues. He uses GPS in unfamiliar places.   Any mood changes since last visit?  Patient denies. His wife reports that the only frustration is towards her, "nobody else". He reports that she has some health issues and he gets frustrated because he wants to help. He declines BH or mood enhancing medications for mild depression.     Hallucinations?  Patient denies   Paranoia?  Patient denies   Patient reports that sleeps well without vivid dreams, REM behavior or sleepwalking . He may stay awake for a while thinking about different things and interferes with his sleep. He is going to try melatonin.  History of sleep apnea?  Patient denies   Any hygiene concerns?  Patient denies   Independent of bathing and dressing?  Endorsed  Does the patient needs help with medications? Patient is in charge  Who is in charge of the finances?   Patient is in charge    Any changes in appetite? Decreased, lost about 8 lbs.   Patient have trouble swallowing? Patient denies   Does the patient cook?  Patient denies   Any kitchen accidents such as leaving the stove on? Patient denies   Any headaches?  Patient denies   Double vision? Patient denies  Any focal numbness or tingling?  Patient denies   Chronic back pain Patient denies   Unilateral weakness?  Patient denies   Any tremors?  Patient denies   Any history of anosmia?  Patient denies   Any incontinence of urine?  Patient denies   Any bowel dysfunction? On  Oct 27 he spent 5 days hospitalized for H. Pylori but had a good recovery.    Patient lives Spouse   History on Initial Assessment 11/25/2019: This is a 81 year old right-handed man with a history of hypertension, hyperlipidemia, diabetes, sleep apnea  on CPAP, colon cancer s/p resection, presenting for evaluation of memory loss/word-finding difficulties. He reported symptoms started 2 years ago, MMSE 25/30 in PCP office last 08/2019. He and his wife started noticing more significant changes in the past 1 and 1/2 years. He endorses word-finding difficulties, he tries to tell something and the important part slips by him. He states it is not constant, however his wife says it happens a lot. He is searching for words and thoughts, unable to remember names of things or restaurants. He is forgetting how to do things sometimes, this is not often, but one time he forgot how to put his car in park (had to push a button). He has had this car for 2 years. He repeats stories. He cannot focus when watching TV and she is speaking to him, unable to multitask. He says "huh" or "what did you say" a lot, and she does not think it is his hearing. He lives with his wife. He denies getting lost driving, no missed ills or medications. He denies misplacing things. His wife has also noticed personality changes. He has a short temper worse than before and gets very nasty. When he cannot think of a word, it makes him nervous and uptight. He gets angry and upset when criticized or when he does not agree. No paranoia or hallucinations, no prior psychiatric history. He states his mood is good. His mother had dementia. No history of significant head injuries. No alcohol use.   He had constipation which resolved with a stool softener. He denies any headaches, dizziness, diplopia, dysarthria/dysphagia, neck/back pain, focal numbness/tingling/weakness, bladder dysfunction, anosmia, or tremors. He sleeps like a baby with his CPAP machine. 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    Neurocognitive testing 11/2020 Dr. Melvyn Novas Briefly, results suggested noted performance variability surrounding executive functioning, semantic fluency, confrontation naming, and encoding (i.e., encoding)  and retrieval aspects of memory. Performance was generally appropriate relative to age-matched peers across processing speed, receptive language, phonemic fluency, and visuospatial abilities. Relative to his evaluation one year prior, his most notable areas of decline surrounded executive functioning and attention/concentration. These were generally mild overall. Regarding etiology, there remain some concerns surrounding Alzheimer's disease based upon memory dysfunction, coupled with deficits in confrontation naming, semantic fluency, and executive functioning. As such, I cannot rule out this diagnosis. However, despite scores suggesting delayed memory falling in the exceptionally low normative range, he was able to demonstrate some appropriate retrieval/consolidation abilities, which would not suggest characteristic rapid forgetting. If Alzheimer's disease is indeed the underlying pathology, it appears to be progressing slowly, likely aided by current medication intervention and regular physical/mental stimulation.   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 All started after a hemorrhoid surgery and later with urinary retention. Patient at one point was septic from urinary issues.     Diabetes mellitus type II, controlled 04/23/2007    Metformin 1g BID, amaryl '4mg'$ --> '8mg'$ , had to add [place] back  Never took victoza.   Januvia-lethargic and dizzy in past. 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   History of colon cancer    Tubular adenoma of  colon; s/p colectomy 1992    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   History of shingles    Hyperlipidemia associated with type 2 diabetes mellitus 04/23/2007   Atorvastatin '20mg'$  once weekly     Hypertension associated with diabetes 04/23/2007   Amlodipine '5mg'$ , telmisartan '40mg'$      Iritis 12/17/2008   Shingles 2010    Mild neurocognitive disorder 12/23/2019   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   Obstructive sleep apnea 04/23/2007   uses CPAP nightly   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   Sepsis secondary to UTI 03/29/2015     Past Surgical History:  Procedure Laterality Date   AMPUTATION TOE Right 09/14/2016   Procedure: RIGHT 2ND TOE/RAY AMPUTATION;  Surgeon: Leandrew Koyanagi, MD;  Location: [facility];  Service: Orthopedics;  Laterality: Right;   APPENDECTOMY  05/03/1947   BIOPSY  01/28/2022   Procedure: BIOPSY;  Surgeon: Doran Stabler, MD;  Location: [facility];  Service: Gastroenterology;;   COLECTOMY  05/02/1990   CYSTOSCOPY N/A 06/21/2015   Procedure: CYSTOSCOPY, CLOT EVACUATION, AND CAUTERIZATION OF PROSTATE FOSSA;  Surgeon: Carolan Clines, MD;  Location: [facility];  Service: Urology;  Laterality: N/A;   ESOPHAGOGASTRODUODENOSCOPY (EGD) WITH PROPOFOL N/A 01/26/2022   Procedure: ESOPHAGOGASTRODUODENOSCOPY (EGD) WITH PROPOFOL;  Surgeon: Doran Stabler, MD;  Location: [facility];  Service: Gastroenterology;  Laterality: N/A;   ESOPHAGOGASTRODUODENOSCOPY (EGD) WITH PROPOFOL N/A 01/28/2022   Procedure: ESOPHAGOGASTRODUODENOSCOPY (EGD) WITH PROPOFOL;  Surgeon: Doran Stabler, MD;  Location: [facility];  Service: Gastroenterology;  Laterality: N/A;   HEMORRHOID SURGERY     HEMOSTASIS CLIP PLACEMENT  01/26/2022   Procedure: HEMOSTASIS CLIP PLACEMENT;  Surgeon: Doran Stabler, MD;  Location: [facility];  Service: Gastroenterology;;   HEMOSTASIS CONTROL  01/26/2022   Procedure: HEMOSTASIS CONTROL;  Surgeon: Doran Stabler, MD;  Location: [facility];  Service: Gastroenterology;;   HOT HEMOSTASIS N/A 01/26/2022   Procedure: HOT HEMOSTASIS (ARGON PLASMA COAGULATION/BICAP);  Surgeon: Doran Stabler, MD;  Location: [facility];  Service: Gastroenterology;  Laterality: N/A;   MOLE REMOVAL  2022   hand   SCLEROTHERAPY  01/26/2022   Procedure: SCLEROTHERAPY;  Surgeon: Doran Stabler, MD;  [facility];  Service: Gastroenterology;;   TOE SURGERY  04/2020   TRANSURETHRAL RESECTION OF PROSTATE N/A 05/23/2015   Procedure: TRANSURETHRAL RESECTION OF THE PROSTATE (TURP);  Surgeon: Irine Seal, MD;  Location: [facility];  Service: Urology;  Laterality: N/A;     PREVIOUS MEDICATIONS:   CURRENT MEDICATIONS:  Outpatient Encounter Medications as of 03/07/2022  Medication Sig   amLODipine (NORVASC) 5 MG tablet TAKE 1 TABLET BY MOUTH EVERY DAY (Patient taking differently: Take 5 mg by mouth daily.)   [employer] STOMACH RELIEF 262 MG chewable tablet  Chew 2 tablets by mouth 4 (four) times daily.   donepezil (ARICEPT) 10 MG tablet TAKE 1 TABLET BY MOUTH EVERYDAY AT BEDTIME (Patient taking differently: Take 10 mg by mouth at bedtime.)   ferrous sulfate 325 (65 FE) MG tablet Take 1 tablet (325 mg total) by mouth 2 (two) times daily with a meal.   glipiZIDE (GLUCOTROL) 10 MG tablet TAKE 1 TABLET (10 MG TOTAL) BY MOUTH 2 (TWO) TIMES DAILY BEFORE A MEAL.   Glucagon (GVOKE HYPOPEN 2-PACK) 0.5 MG/0.1ML SOAJ Inject 0.5 mg into the skin daily as needed (for low blood sugar).   JANUVIA 100 MG tablet TAKE 1 TABLET BY MOUTH EVERY DAY (Patient taking differently: Take 100 mg by mouth daily.)   Lutein 20 MG TABS Take 20 mg by mouth daily.   metFORMIN (GLUCOPHAGE) 1000 MG tablet TAKE 1 TABLET BY MOUTH 2 TIMES DAILY WITH A MEAL. (Patient taking differently: Take 1,000 mg by mouth 2 (two) times daily with a meal.)   Multiple Vitamins-Minerals (CENTRUM SILVER PO) Take 1 tablet by mouth daily.    ONETOUCH ULTRA test strip USE ONCE DAILY AS INSTRUCTED    pantoprazole (PROTONIX) 40 MG tablet Take 1 tablet (40 mg total) by mouth 2 (two) times daily.   rosuvastatin (CRESTOR) 20 MG tablet TAKE 1 TABLET BY MOUTH ONE TIME PER WEEK (Patient taking differently: Take 20 mg by mouth once a week.)   telmisartan (MICARDIS) 40 MG tablet TAKE 1 TABLET BY MOUTH EVERY DAY   VITAMIN A PO Take 1 tablet by mouth daily.   No facility-administered encounter medications on file as of 03/07/2022.     Objective:     PHYSICAL EXAMINATION:    VITALS:   Vitals:   03/07/22 1509  BP: (!) 148/69  Pulse: 75  SpO2: 96%  Weight: 194 lb (88 kg)    GEN:  The patient appears stated age and is in NAD. HEENT:  Normocephalic, atraumatic.   Neurological examination:  General: NAD, well-groomed, appears stated age. Orientation: The patient is alert. Oriented to person, place and date Cranial nerves: There is good facial symmetry.The speech is fluent and clear. No aphasia or dysarthria. Fund of knowledge is appropriate. Recent memory impaired and remote memory is normal.  Attention and concentration are normal.  Able to name objects and repeat phrases.  Hearing is intact to conversational tone.    Sensation: Sensation is intact to light touch throughout he has R 2nd toe amputation  Motor: Strength is at least antigravity x4. DTR's 2/4 in UE/LE       No data to display             09/02/2021   11:00 AM  MMSE - Mini Mental State Exam  Orientation to time 5  Orientation to Place 5  Registration 3  Attention/ Calculation 5  Recall 0  Language- name 2 objects 2  Language- repeat 1  Language- follow 3 step command 3  Language- read & follow direction 1  Write a sentence 1  Copy design 1  Total score 27       Movement examination: Tone: There is normal tone in the UE/LE Abnormal movements:  no tremor.  No myoclonus.  No asterixis.   Coordination:  There is no decremation with RAM's. Normal finger to nose  Gait and Station: The patient has no difficulty  arising out of a deep-seated chair without the use of the hands. The patient's stride length is good.  Gait is cautious and narrow.  Thank you for allowing [place] the opportunity to participate in the care of this nice patient. Please do not hesitate to contact us for any questions or concerns.   Total time spent on today's visit was 37 minutes dedicated to this patient today, preparing to see patient, examining the patient, ordering tests and/or medications and counseling the patient, documenting clinical information in the EHR or other health record, independently interpreting results and communicating results to the patient/family, discussing treatment and goals, answering patient's questions and coordinating care.  Cc:  [name], MD  Sharene Butters 03/07/2022 5:35 PM

## 2022-03-14 ENCOUNTER — Ambulatory Visit: Payer: Medicare Other | Admitting: Neurology

## 2022-03-17 LAB — H. PYLORI ANTIGEN, STOOL: H pylori Ag, Stl: NEGATIVE

## 2022-03-28 ENCOUNTER — Telehealth: Payer: Self-pay | Admitting: Physician Assistant

## 2022-03-28 NOTE — Telephone Encounter (Signed)
Returned call to patient's wife, [name]. [name] is aware that the orders are in the system and patient can come by on Friday at his convenience. No appt necessary. I provided [name] with the lab location and hours. [name] verbalized understanding and had no concerns at the end of the call.

## 2022-03-28 NOTE — Telephone Encounter (Signed)
Patients wife called to confirm lab orders were in the system, she was advise the orders were placed and ready for him to go to the lab at his convenience. She asked if he should be getting them done 04/01/22 or after since that is what is indicated in the patients after summary report. I advise her the patient is able to go now or when he can as long as it is around that time frame and if additional lab work was needed they would advise him of it once they receive lab results. She would like to confirm prior to patient going to lab.

## 2022-04-01 ENCOUNTER — Other Ambulatory Visit (INDEPENDENT_AMBULATORY_CARE_PROVIDER_SITE_OTHER): Payer: Medicare Other

## 2022-04-01 DIAGNOSIS — K922 Gastrointestinal hemorrhage, unspecified: Secondary | ICD-10-CM

## 2022-04-01 DIAGNOSIS — D62 Acute posthemorrhagic anemia: Secondary | ICD-10-CM | POA: Diagnosis not present

## 2022-04-01 DIAGNOSIS — K264 Chronic or unspecified duodenal ulcer with hemorrhage: Secondary | ICD-10-CM

## 2022-04-01 DIAGNOSIS — A048 Other specified bacterial intestinal infections: Secondary | ICD-10-CM | POA: Diagnosis not present

## 2022-04-01 LAB — CBC WITH DIFFERENTIAL/PLATELET
Basophils Absolute: 0 10*3/uL (ref 0.0–0.1)
Basophils Relative: 0.6 % (ref 0.0–3.0)
Eosinophils Absolute: 0.4 10*3/uL (ref 0.0–0.7)
Eosinophils Relative: 5 % (ref 0.0–5.0)
HCT: 40.1 % (ref 39.0–52.0)
Hemoglobin: 13.5 g/dL (ref 13.0–17.0)
Lymphocytes Relative: 19.5 % (ref 12.0–46.0)
Lymphs Abs: 1.4 10*3/uL (ref 0.7–4.0)
MCHC: 33.6 g/dL (ref 30.0–36.0)
MCV: 91.8 fl (ref 78.0–100.0)
Monocytes Absolute: 0.5 10*3/uL (ref 0.1–1.0)
Monocytes Relative: 7.5 % (ref 3.0–12.0)
Neutro Abs: 4.8 10*3/uL (ref 1.4–7.7)
Neutrophils Relative %: 67.4 % (ref 43.0–77.0)
Platelets: 213 10*3/uL (ref 150.0–400.0)
RBC: 4.37 Mil/uL (ref 4.22–5.81)
RDW: 15.2 % (ref 11.5–15.5)
WBC: 7.1 10*3/uL (ref 4.0–10.5)

## 2022-04-01 LAB — IBC + FERRITIN
Ferritin: 14.3 ng/mL — ABNORMAL LOW (ref 22.0–322.0)
Iron: 47 ug/dL (ref 42–165)
Saturation Ratios: 12.6 % — ABNORMAL LOW (ref 20.0–50.0)
TIBC: 372.4 ug/dL (ref 250.0–450.0)
Transferrin: 266 mg/dL (ref 212.0–360.0)

## 2022-04-05 ENCOUNTER — Other Ambulatory Visit: Payer: Self-pay

## 2022-04-05 DIAGNOSIS — D62 Acute posthemorrhagic anemia: Secondary | ICD-10-CM

## 2022-04-05 DIAGNOSIS — K922 Gastrointestinal hemorrhage, unspecified: Secondary | ICD-10-CM

## 2022-04-05 DIAGNOSIS — K264 Chronic or unspecified duodenal ulcer with hemorrhage: Secondary | ICD-10-CM

## 2022-04-05 DIAGNOSIS — A048 Other specified bacterial intestinal infections: Secondary | ICD-10-CM

## 2022-04-21 ENCOUNTER — Ambulatory Visit (INDEPENDENT_AMBULATORY_CARE_PROVIDER_SITE_OTHER): Payer: Medicare Other | Admitting: Family Medicine

## 2022-04-21 ENCOUNTER — Encounter: Payer: Self-pay | Admitting: Family Medicine

## 2022-04-21 VITALS — BP 124/60 | HR 86 | Temp 98.2°F | Ht 70.0 in | Wt 196.2 lb

## 2022-04-21 DIAGNOSIS — E785 Hyperlipidemia, unspecified: Secondary | ICD-10-CM

## 2022-04-21 DIAGNOSIS — E119 Type 2 diabetes mellitus without complications: Secondary | ICD-10-CM

## 2022-04-21 DIAGNOSIS — G3184 Mild cognitive impairment, so stated: Secondary | ICD-10-CM | POA: Diagnosis not present

## 2022-04-21 DIAGNOSIS — Z0000 Encounter for general adult medical examination without abnormal findings: Secondary | ICD-10-CM

## 2022-04-21 DIAGNOSIS — E1169 Type 2 diabetes mellitus with other specified complication: Secondary | ICD-10-CM

## 2022-04-21 DIAGNOSIS — I10 Essential (primary) hypertension: Secondary | ICD-10-CM

## 2022-04-21 DIAGNOSIS — Z79899 Other long term (current) drug therapy: Secondary | ICD-10-CM

## 2022-04-21 LAB — COMPREHENSIVE METABOLIC PANEL
ALT: 23 U/L (ref 0–53)
AST: 18 U/L (ref 0–37)
Albumin: 4.2 g/dL (ref 3.5–5.2)
Alkaline Phosphatase: 68 U/L (ref 39–117)
BUN: 13 mg/dL (ref 6–23)
CO2: 30 mEq/L (ref 19–32)
Calcium: 9.7 mg/dL (ref 8.4–10.5)
Chloride: 100 mEq/L (ref 96–112)
Creatinine, Ser: 0.88 mg/dL (ref 0.40–1.50)
GFR: 80.69 mL/min (ref >60.00)
Glucose, Bld: 233 mg/dL — ABNORMAL HIGH (ref 70–99)
Potassium: 4.3 mEq/L (ref 3.5–5.1)
Sodium: 139 mEq/L (ref 135–145)
Total Bilirubin: 0.4 mg/dL (ref 0.2–1.2)
Total Protein: 7 g/dL (ref 6.0–8.3)

## 2022-04-21 LAB — CBC WITH DIFFERENTIAL/PLATELET
Basophils Absolute: 0 10*3/uL (ref 0.0–0.1)
Basophils Relative: 0.3 % (ref 0.0–3.0)
Eosinophils Absolute: 0.3 10*3/uL (ref 0.0–0.7)
Eosinophils Relative: 5.8 % — ABNORMAL HIGH (ref 0.0–5.0)
HCT: 40.9 % (ref 39.0–52.0)
Hemoglobin: 13.9 g/dL (ref 13.0–17.0)
Lymphocytes Relative: 18.9 % (ref 12.0–46.0)
Lymphs Abs: 1 10*3/uL (ref 0.7–4.0)
MCHC: 33.9 g/dL (ref 30.0–36.0)
MCV: 90.4 fl (ref 78.0–100.0)
Monocytes Absolute: 0.4 10*3/uL (ref 0.1–1.0)
Monocytes Relative: 8.4 % (ref 3.0–12.0)
Neutro Abs: 3.5 10*3/uL (ref 1.4–7.7)
Neutrophils Relative %: 66.6 % (ref 43.0–77.0)
Platelets: 217 10*3/uL (ref 150.0–400.0)
RBC: 4.53 Mil/uL (ref 4.22–5.81)
RDW: 14.9 % (ref 11.5–15.5)
WBC: 5.3 10*3/uL (ref 4.0–10.5)

## 2022-04-21 LAB — LIPID PANEL
Cholesterol: 120 mg/dL (ref 0–200)
HDL: 30.2 mg/dL — ABNORMAL LOW (ref >39.00)
LDL Cholesterol: 59 mg/dL (ref 0–99)
NonHDL: 89.8
Total CHOL/HDL Ratio: 4
Triglycerides: 156 mg/dL — ABNORMAL HIGH (ref 0.0–149.0)
VLDL: 31.2 mg/dL (ref 0.0–40.0)

## 2022-04-21 LAB — VITAMIN B12: Vitamin B-12: 458 pg/mL (ref 211–911)

## 2022-04-21 LAB — HEMOGLOBIN A1C: Hgb A1c MFr Bld: 8.3 % — ABNORMAL HIGH (ref 4.6–6.5)

## 2022-04-21 MED ORDER — SITAGLIPTIN PHOSPHATE 100 MG PO TABS: 100.0000 mg | ORAL_TABLET | Freq: Every day | ORAL | 3 refills | Status: DC

## 2022-04-21 NOTE — Patient Instructions (Addendum)
Please stop by lab before you go If you have mychart- we will send your results within 3 business days of [place] receiving them.  If you do not have mychart- we will call you about results within 5 business days of [place] receiving them.  *please also note that you will see labs on mychart as soon as they post. I will later go in and write notes on them- will say "notes from Dr. Yong Channel"   Consider RSV and covid shot at pharmacy  Recommended follow up: Return in about 4 months (around 08/21/2022) for followup or sooner if needed.Schedule b4 you leave.

## 2022-04-21 NOTE — Progress Notes (Signed)
Phone: [phone]   Subjective:  Patient presents today for their annual physical. Chief complaint-noted.   See problem oriented charting- ROS- full  review of systems was completed and negative  Per full ROS sheet completed by patient  The following were reviewed and entered/updated in epic: Past Medical History:  Diagnosis Date   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   S/p 2nd ray amputation 2018- started after prolonged use of corn pad   Allergic rhinitis 08/19/2019   flonase   Anemia    BPH (benign prostatic hyperplasia) 07/02/2015   S/p TURP with multiple complications afterwards including recurrent hospitalizations. All started after a hemorrhoid surgery and later with urinary retention. Patient at one point was septic from urinary issues.     Diabetes mellitus type II, controlled 04/23/2007    Metformin 1g BID, amaryl '4mg'$ --> '8mg'$ , had to add [place] back  Never took victoza.   Januvia-lethargic and dizzy in past. 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   History of colon cancer    Tubular adenoma of colon; s/p colectomy 1992    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   History of shingles    Hyperlipidemia associated with type 2 diabetes mellitus 04/23/2007   Atorvastatin '20mg'$  once weekly     Hypertension associated with diabetes 04/23/2007   Amlodipine '5mg'$ , telmisartan '40mg'$      Iritis 12/17/2008   Shingles 2010    Mild neurocognitive disorder 12/23/2019   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   Obstructive sleep apnea 04/23/2007   uses CPAP nightly   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   Sepsis secondary to UTI 03/29/2015   Patient Active Problem List   Diagnosis Date Noted   Acute upper GI bleeding 01/26/2022    Priority: High   Duodenal ulcer hemorrhage     Priority: High   Mild neurocognitive  disorder 12/23/2019    Priority: High   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    Priority: High   BPH (benign prostatic hyperplasia) 07/02/2015    Priority: High   Diabetes mellitus type II, controlled 04/23/2007    Priority: High   History of colon cancer 01/27/2014    Priority: Medium    Hyperlipidemia associated with type 2 diabetes mellitus 04/23/2007    Priority: Medium    Obstructive sleep apnea 04/23/2007    Priority: Medium    Essential hypertension 04/23/2007    Priority: Medium    Allergic rhinitis 08/19/2019    Priority: Low   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    Priority: Low   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    Priority: Low   Hallux valgus of right foot 03/26/2014    Priority: Low   Iritis 12/17/2008    Priority: Low   Postherpetic neuralgia 05/22/2008    Priority: Low   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    Priority: Low   Bigeminy 04/21/2008    Priority: Low   Pulmonary nodule 02/04/2022   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   Past Surgical History:  Procedure Laterality Date   AMPUTATION TOE Right 09/14/2016   Procedure: RIGHT 2ND TOE/RAY AMPUTATION;  Surgeon: Leandrew Koyanagi, MD;  Location: [facility];  Service: Orthopedics;  Laterality: Right;   APPENDECTOMY  05/03/1947   BIOPSY  01/28/2022   Procedure: BIOPSY;  Surgeon: Nelida Meuse  III, MD;  Location: [facility];  Service: Gastroenterology;;   COLECTOMY  05/02/1990   CYSTOSCOPY N/A 06/21/2015   Procedure: CYSTOSCOPY, CLOT EVACUATION, AND CAUTERIZATION OF PROSTATE FOSSA;  Surgeon: Carolan Clines, MD;  Location: [facility];  Service: Urology;  Laterality: N/A;   ESOPHAGOGASTRODUODENOSCOPY (EGD) WITH PROPOFOL N/A 01/26/2022   Procedure: ESOPHAGOGASTRODUODENOSCOPY (EGD) WITH PROPOFOL;  Surgeon: Doran Stabler, MD;  Location: [facility];  Service: Gastroenterology;  Laterality: N/A;   ESOPHAGOGASTRODUODENOSCOPY (EGD) WITH PROPOFOL N/A 01/28/2022    Procedure: ESOPHAGOGASTRODUODENOSCOPY (EGD) WITH PROPOFOL;  Surgeon: Doran Stabler, MD;  Location: [facility];  Service: Gastroenterology;  Laterality: N/A;   HEMORRHOID SURGERY     HEMOSTASIS CLIP PLACEMENT  01/26/2022   Procedure: HEMOSTASIS CLIP PLACEMENT;  Surgeon: Doran Stabler, MD;  Location: [facility];  Service: Gastroenterology;;   HEMOSTASIS CONTROL  01/26/2022   Procedure: HEMOSTASIS CONTROL;  Surgeon: Doran Stabler, MD;  Location: [facility];  Service: Gastroenterology;;   HOT HEMOSTASIS N/A 01/26/2022   Procedure: HOT HEMOSTASIS (ARGON PLASMA COAGULATION/BICAP);  Surgeon: Doran Stabler, MD;  Location: [facility];  Service: Gastroenterology;  Laterality: N/A;   MOLE REMOVAL  2022   hand   SCLEROTHERAPY  01/26/2022   Procedure: SCLEROTHERAPY;  Surgeon: Doran Stabler, MD;  Location: [facility];  Service: Gastroenterology;;   TOE SURGERY  04/2020   TRANSURETHRAL RESECTION OF PROSTATE N/A 05/23/2015   Procedure: TRANSURETHRAL RESECTION OF THE PROSTATE (TURP);  Surgeon: Irine Seal, MD;  Location: [facility];  Service: Urology;  Laterality: N/A;    Family History  Problem Relation Age of Onset   Alzheimer's disease Mother    Colon cancer Father 22   Diabetes Father        type I   Colon cancer Paternal Grandmother 35   Rectal cancer Maternal Uncle    Cancer Other        colon/fhx   Esophageal cancer Neg Hx    Stomach cancer Neg Hx     Medications- reviewed and updated Current Outpatient Medications  Medication Sig Dispense Refill   amLODipine (NORVASC) 5 MG tablet TAKE 1 TABLET BY MOUTH EVERY DAY (Patient taking differently: Take 5 mg by mouth daily.) 90 tablet 3   [employer] STOMACH RELIEF 262 MG chewable tablet Chew 2 tablets by mouth 4 (four) times daily.     donepezil (ARICEPT) 10 MG tablet TAKE 1 TABLET BY MOUTH EVERYDAY AT BEDTIME (Patient taking differently: Take 10 mg by mouth at bedtime.) 90 tablet 3   ferrous sulfate 325 (65 FE) MG tablet Take  1 tablet (325 mg total) by mouth 2 (two) times daily with a meal.     glipiZIDE (GLUCOTROL) 10 MG tablet TAKE 1 TABLET (10 MG TOTAL) BY MOUTH 2 (TWO) TIMES DAILY BEFORE A MEAL. 180 tablet 3   Glucagon (GVOKE HYPOPEN 2-PACK) 0.5 MG/0.1ML SOAJ Inject 0.5 mg into the skin daily as needed (for low blood sugar). 0.2 mL 1   Lutein 20 MG TABS Take 20 mg by mouth daily.     metFORMIN (GLUCOPHAGE) 1000 MG tablet TAKE 1 TABLET BY MOUTH 2 TIMES DAILY WITH A MEAL. (Patient taking differently: Take 1,000 mg by mouth 2 (two) times daily with a meal.) 180 tablet 3   Multiple Vitamins-Minerals (CENTRUM SILVER PO) Take 1 tablet by mouth daily.      ONETOUCH ULTRA test strip USE ONCE DAILY AS INSTRUCTED 100 strip 3   pantoprazole (PROTONIX) 40 MG tablet Take 1  tablet (40 mg total) by mouth 2 (two) times daily. 60 tablet 2   rosuvastatin (CRESTOR) 20 MG tablet TAKE 1 TABLET BY MOUTH ONE TIME PER WEEK (Patient taking differently: Take 20 mg by mouth once a week.) 13 tablet 3   telmisartan (MICARDIS) 40 MG tablet TAKE 1 TABLET BY MOUTH EVERY DAY 90 tablet 3   VITAMIN A PO Take 1 tablet by mouth daily.     sitaGLIPtin (JANUVIA) 100 MG tablet Take 1 tablet (100 mg total) by mouth daily. 90 tablet 3   No current facility-administered medications for this visit.    Allergies-reviewed and updated Allergies  Allergen Reactions   Bactrim [Sulfamethoxazole-Trimethoprim] Nausea And Vomiting    Pt has chills and fevers also.    Social History   Social History Narrative   Married 1966. 2 daughters Sharee Pimple divorced lives in [place] works for [employer] no kids and Mateo Flow never married in [place], [place] working for [employer] for [employer] improvement no kids.       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 Oropharynx normal Neck: no thyromegaly CV: RRR no murmurs rubs or gallops Lungs: CTAB no crackles, wheeze, rhonchi Abdomen: soft/nontender/nondistended/normal bowel sounds. No rebound or guarding.  Ext: no edema Skin: warm, dry Neuro: grossly normal, moves all extremities, PERRLA   Assessment and Plan  81 y.o. male presenting for annual physical.  Health Maintenance counseling: 1. Anticipatory guidance: Patient counseled regarding regular dental exams -q6 months, eye exams -yearly,  avoiding smoking and second hand smoke , limiting alcohol to 2 beverages per day - very rare intake- several months since last drink, no illicit drugs .   2. Risk factor reduction:  Advised patient of need for regular exercise and diet rich and fruits and vegetables to reduce risk of heart attack and stroke.  Exercise- walking 4-5 days a week until it got colder.  Diet/weight management-weight stable form mid year. Mild weight loss may be helpful but really want him focused on eating good foods and exercising not the # itself Wt Readings from Last 3 Encounters:  04/21/22 196 lb 3.2 oz (89 kg)  03/07/22 194 lb (88 kg)  03/02/22 195 lb (88.5 kg)  3. Immunizations/screenings/ancillary studies- undecided on covid shot, RSV - considered Immunization History  Administered Date(s) Administered   Fluad Quad(high Dose 65+) 01/08/2019, 01/16/2020, 02/25/2021, 02/04/2022   Influenza Split 01/31/2011, 01/16/2012   Influenza Whole 03/07/2007, 01/31/2008, 01/30/2009   Influenza, High Dose Seasonal PF 01/11/2017, 02/05/2018   Influenza,inj,Quad PF,6+ Mos 01/21/2013, 01/27/2014, 12/26/2014   Influenza-Unspecified 01/05/2016   PFIZER(Purple Top)SARS-COV-2 Vaccination 05/24/2019, 06/14/2019, 02/02/2020   Pfizer Covid-19 Vaccine  Bivalent Booster 30yr & up 02/15/2021   Pneumococcal Conjugate-13 08/19/2013   Pneumococcal Polysaccharide-23 04/04/2007   Td 04/04/2007, 09/14/2017   Varicella 08/19/2013  4. Prostate cancer screening-    passed age based screening recommendations - no significant change in urinary symptoms recently and will hold off. History of TURP- worked well  5. Colon cancer screening -  history of colon cancer but has had excellent follow up for this after colectomy 1992- Dr. Fuller Plan said no further colonoscopy BUT they discussed at last visit looking at this again in 2024  6. Skin cancer screening- follows with derm regularly.hx cancer advised regular sunscreen use. Denies worrisome, changing, or new skin lesions.  7. Smoking associated screening (lung cancer screening, AAA screen 65-75, UA)- Former smoker-quit over 40 years ago- no regular screening needed 8. STD screening -  opts out as monogamous   Status of chronic or acute concerns   #Mild cognitive impairment-follows with Dr. Delice Lesch S: Medication: Donepezil 10 mg A/P: stable- continue current medicines - continue current medications and neuro follow up  - 6 month fu planned   # Diabetes-poorly controlled at times  S: Medication:glipizide 10 mg twice daily, januvia '100mg'$  daily, metformin  '1000mg'$  twice daily  -gvoke available at home- hasn't needed  CBGs- less variability now in last 2-3 weeks- had been more variable after hospital Exercise and diet- see above Lab Results  Component Value Date   HGBA1C 7.2 (A) 11/05/2021   HGBA1C 7.4 (H) 07/02/2021   HGBA1C 8.3 (H) 03/02/2021  A/P: he prefers to buckle down on diet and exercise as long as a1c is under 8- update today -continue current medications    #hypertension S: medication: Telmisartan 40 mg every day, amlodipine 5 mg every day BP Readings from Last 3 Encounters:  04/21/22 124/60  03/07/22 (!) 148/69  03/02/22 114/60  A/P: stable- continue current medicines   #hyperlipidemia S:  Medication: Rosuvastatin 20 mg once a week Lab Results  Component Value Date   CHOL 139 03/02/2021   HDL 42.60 03/02/2021   LDLCALC 68 03/02/2021   LDLDIRECT 90.0 09/14/2017   TRIG 141.0 03/02/2021   CHOLHDL 3 03/02/2021   A/P: #s have looked good even with low dose- continue current medications and update lipids  # GERD S:Medication:   pantoprazole 40 mg BID after H. Pylori in sept 2023 (now down to once a day)- on iron due to anemia from ulcer.  -patient also prefers to discuss colonoscopy with Dr. 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 Future Appointments  Date Time Provider [place]  05/03/2022  8:15 AM Trula Slade, DPM [facility]  05/04/2022 10:45 AM [facility] CASTING [facility]  05/12/2022 10:15 AM [facility] HEALTH COACH [facility]  09/20/2022  3:00 PM Rondel Jumbo, PA-C [facility] None  12/12/2022  8:30 AM Hazle Coca, PhD [facility] None  12/12/2022  9:30 AM [facility] None  12/20/2022 10:00 AM Hazle Coca, PhD [facility] None   Lab/Order associations: fasting   ICD-10-CM   1. Preventative health care  Z00.00     2. Controlled type 2 diabetes mellitus without complication, without long-term current use of insulin (HCC)  E11.9 CBC with Differential/Platelet    Comprehensive metabolic panel    Lipid panel    Hemoglobin A1c    3. High risk medication use  Z79.899 Vitamin B12    4. Mild neurocognitive disorder  G31.84     5. Hyperlipidemia associated with type 2 diabetes  mellitus  E11.69    E78.5     6. Essential hypertension  I10      Meds ordered this encounter   Medications   sitaGLIPtin (JANUVIA) 100 MG tablet    Sig: Take 1 tablet (100 mg total) by mouth daily.    Dispense:  90 tablet    Refill:  3   Return precautions advised.  [name], MD

## 2022-04-22 ENCOUNTER — Encounter: Payer: Medicare Other | Admitting: Family Medicine

## 2022-05-03 ENCOUNTER — Encounter: Payer: Self-pay | Admitting: Podiatry

## 2022-05-03 ENCOUNTER — Ambulatory Visit (INDEPENDENT_AMBULATORY_CARE_PROVIDER_SITE_OTHER): Payer: Medicare Other | Admitting: Podiatry

## 2022-05-03 VITALS — BP 149/75 | HR 69

## 2022-05-03 DIAGNOSIS — E1149 Type 2 diabetes mellitus with other diabetic neurological complication: Secondary | ICD-10-CM

## 2022-05-03 DIAGNOSIS — M2031 Hallux varus (acquired), right foot: Secondary | ICD-10-CM

## 2022-05-03 DIAGNOSIS — M79674 Pain in right toe(s): Secondary | ICD-10-CM | POA: Diagnosis not present

## 2022-05-03 DIAGNOSIS — B351 Tinea unguium: Secondary | ICD-10-CM | POA: Diagnosis not present

## 2022-05-03 DIAGNOSIS — Z89421 Acquired absence of other right toe(s): Secondary | ICD-10-CM

## 2022-05-03 DIAGNOSIS — M216X1 Other acquired deformities of right foot: Secondary | ICD-10-CM

## 2022-05-03 DIAGNOSIS — M79675 Pain in left toe(s): Secondary | ICD-10-CM | POA: Diagnosis not present

## 2022-05-03 NOTE — Progress Notes (Unsigned)
Patient presents to the office today for diabetic shoe and insole measuring.  Patient was for 1 pair of extra depth shoes and foam casted for 3 pair of insoles.   ABN signed.   Documentation of medical necessity will be sent to patient's treating diabetic doctor to verify and sign.   Patient's diabetic provider: [name], MD  Shoes and insoles will be ordered at that time and patient will be notified for an appointment for fitting when they arrive.   Patient shoe selection-   1st   Shoe choice:   [id number]  2nd  Shoe choice:   [id number]  Shoe size ordered: 10.5 WIDE

## 2022-05-03 NOTE — Progress Notes (Unsigned)
Subjective: Chief Complaint  Patient presents with   Debridement    Trim toenails    Denies any systemic complaints such as fevers, chills, nausea, vomiting. No acute changes since last appointment, and no other complaints at this time.   Objective: AAO x3, NAD DP/PT pulses palpable bilaterally, CRT less than 3 seconds Protective sensation intact with Simms Weinstein monofilament, vibratory sensation intact, Achilles tendon reflex intact No areas of pinpoint bony tenderness or pain with vibratory sensation. MMT 5/5, ROM WNL. No edema, erythema, increase in warmth to bilateral lower extremities.  No open lesions or pre-ulcerative lesions.  No pain with calf compression, swelling, warmth, erythema  Assessment:  Plan: -All treatment options discussed with the patient including all alternatives, risks, complications.   -Patient encouraged to call the office with any questions, concerns, change in symptoms.

## 2022-05-04 ENCOUNTER — Other Ambulatory Visit: Payer: Self-pay | Admitting: Family Medicine

## 2022-05-04 ENCOUNTER — Other Ambulatory Visit: Payer: Self-pay | Admitting: Neurology

## 2022-05-04 ENCOUNTER — Other Ambulatory Visit: Payer: Medicare Other

## 2022-05-04 DIAGNOSIS — G3184 Mild cognitive impairment, so stated: Secondary | ICD-10-CM

## 2022-05-04 DIAGNOSIS — R4189 Other symptoms and signs involving cognitive functions and awareness: Secondary | ICD-10-CM

## 2022-05-12 ENCOUNTER — Ambulatory Visit (INDEPENDENT_AMBULATORY_CARE_PROVIDER_SITE_OTHER): Payer: Medicare Other

## 2022-05-12 ENCOUNTER — Encounter: Payer: Self-pay | Admitting: Family Medicine

## 2022-05-12 VITALS — Wt 192.0 lb

## 2022-05-12 DIAGNOSIS — Z0000 Encounter for general adult medical examination without abnormal findings: Secondary | ICD-10-CM | POA: Diagnosis not present

## 2022-05-12 NOTE — Patient Instructions (Signed)
[name] , Thank you for taking time to come for your Medicare Wellness Visit. I appreciate your ongoing commitment to your health goals. Please review the following plan we discussed and let me know if I can assist you in the future.   These are the goals we discussed:  Goals      patient     Get A1c down to 6; need to continue exercising; Portion control Fat free or low fat dairy products Fish high in omega-3 acids ( salmon, tuna, trout) Fruits, such as apples, bananas, oranges, pears, prunes Legumes, such as kidney beans, lentils, checkpeas, black-eyed peas and lima beans Vegetables; broccoli, cabbage, carrots Whole grains;   Plant fats are better; decrease "white" foods as pasta, rice, bread and desserts, sugar; Avoid red meat (limiting) palm and coconut oils; sugary foods and beverages  Two nutrients that raise blood chol levels are saturated fats and trans fat; in hydrogenated oils and fats, as stick margarine, baked goods (cookes, cakes, pies, crackers; frosting; and coffee creamers;   Some Fats lower cholesterol: Monounsaturated and polyunsaturated  Avocados Corn, sunflower, and soybean oils Nuts and seeds, such as walnuts Olive, canola, peanut, safflower, and sesame oils Peanut butter Salmon and trout Tofu        Patient Stated     Keep A1c at a manageable level     Patient Stated     Get A1C to 6.0     Patient Stated     Stay healthy and active         This is a list of the screening recommended for you and due dates:  Health Maintenance  Topic Date Due   COVID-19 Vaccine (5 - 2023-24 season) 12/31/2021   Zoster (Shingles) Vaccine (1 of 2) 07/21/2022*   Hemoglobin A1C  10/21/2022   Eye exam for diabetics  01/11/2023   Complete foot exam   02/01/2023   Yearly kidney health urinalysis for diabetes  02/05/2023   Yearly kidney function blood test for diabetes  04/22/2023   Medicare Annual Wellness Visit  05/13/2023   DTaP/Tdap/Td vaccine (3 - Tdap) 09/15/2027    Pneumonia Vaccine  Completed   Flu Shot  Completed   HPV Vaccine  Aged Out   Colon Cancer Screening  Discontinued  *Topic was postponed. The date shown is not the original due date.    Advanced directives: Please bring a copy of your health care power of attorney and living will to the office at your convenience.  Conditions/risks identified: stay healthy and active   Next appointment: Follow up in one year for your annual wellness visit.   Preventive Care 62 Years and Older, Male  Preventive care refers to lifestyle choices and visits with your health care provider that can promote health and wellness. What does preventive care include? A yearly physical exam. This is also called an annual well check. Dental exams once or twice a year. Routine eye exams. Ask your health care provider how often you should have your eyes checked. Personal lifestyle choices, including: Daily care of your teeth and gums. Regular physical activity. Eating a healthy diet. Avoiding tobacco and drug use. Limiting alcohol use. Practicing safe sex. Taking low doses of aspirin every day. Taking vitamin and mineral supplements as recommended by your health care provider. What happens during an annual well check? The services and screenings done by your health care provider during your annual well check will depend on your age, overall health, lifestyle risk factors, and family  history of disease. Counseling  Your health care provider may ask you questions about your: Alcohol use. Tobacco use. Drug use. Emotional well-being. Home and relationship well-being. Sexual activity. Eating habits. History of falls. Memory and ability to understand (cognition). Work and work Statistician. Screening  You may have the following tests or measurements: Height, weight, and BMI. Blood pressure. Lipid and cholesterol levels. These may be checked every 5 years, or more frequently if you are over 63 years  old. Skin check. Lung cancer screening. You may have this screening every year starting at age 20 if you have a 30-pack-year history of smoking and currently smoke or have quit within the past 15 years. Fecal occult blood test (FOBT) of the stool. You may have this test every year starting at age 27. Flexible sigmoidoscopy or colonoscopy. You may have a sigmoidoscopy every 5 years or a colonoscopy every 10 years starting at age 50. Prostate cancer screening. Recommendations will vary depending on your family history and other risks. Hepatitis C blood test. Hepatitis B blood test. Sexually transmitted disease (STD) testing. Diabetes screening. This is done by checking your blood sugar (glucose) after you have not eaten for a while (fasting). You may have this done every 1-3 years. Abdominal aortic aneurysm (AAA) screening. You may need this if you are a current or former smoker. Osteoporosis. You may be screened starting at age 3 if you are at high risk. Talk with your health care provider about your test results, treatment options, and if necessary, the need for more tests. Vaccines  Your health care provider may recommend certain vaccines, such as: Influenza vaccine. This is recommended every year. Tetanus, diphtheria, and acellular pertussis (Tdap, Td) vaccine. You may need a Td booster every 10 years. Zoster vaccine. You may need this after age 44. Pneumococcal 13-valent conjugate (PCV13) vaccine. One dose is recommended after age 55. Pneumococcal polysaccharide (PPSV23) vaccine. One dose is recommended after age 60. Talk to your health care provider about which screenings and vaccines you need and how often you need them. This information is not intended to replace advice given to you by your health care provider. Make sure you discuss any questions you have with your health care provider. Document Released: 05/15/2015 Document Revised: 01/06/2016 Document Reviewed: 02/17/2015 Elsevier  Interactive Patient Education  2017 [employer] Prevention in the Home Falls can cause injuries. They can happen to people of all ages. There are many things you can do to make your home safe and to help prevent falls. What can I do on the outside of my home? Regularly fix the edges of walkways and driveways and fix any cracks. Remove anything that might make you trip as you walk through a door, such as a raised step or threshold. Trim any bushes or trees on the path to your home. Use bright outdoor lighting. Clear any walking paths of anything that might make someone trip, such as rocks or tools. Regularly check to see if handrails are loose or broken. Make sure that both sides of any steps have handrails. Any raised decks and porches should have guardrails on the edges. Have any leaves, snow, or ice cleared regularly. Use sand or salt on walking paths during winter. Clean up any spills in your garage right away. This includes oil or grease spills. What can I do in the bathroom? Use night lights. Install grab bars by the toilet and in the tub and shower. Do not use towel bars as grab bars.  Use non-skid mats or decals in the tub or shower. If you need to sit down in the shower, use a plastic, non-slip stool. Keep the floor dry. Clean up any water that spills on the floor as soon as it happens. Remove soap buildup in the tub or shower regularly. Attach bath mats securely with double-sided non-slip rug tape. Do not have throw rugs and other things on the floor that can make you trip. What can I do in the bedroom? Use night lights. Make sure that you have a light by your bed that is easy to reach. Do not use any sheets or blankets that are too big for your bed. They should not hang down onto the floor. Have a firm chair that has side arms. You can use this for support while you get dressed. Do not have throw rugs and other things on the floor that can make you trip. What can I do  in the kitchen? Clean up any spills right away. Avoid walking on wet floors. Keep items that you use a lot in easy-to-reach places. If you need to reach something above you, use a strong step stool that has a grab bar. Keep electrical cords out of the way. Do not use floor polish or wax that makes floors slippery. If you must use wax, use non-skid floor wax. Do not have throw rugs and other things on the floor that can make you trip. What can I do with my stairs? Do not leave any items on the stairs. Make sure that there are handrails on both sides of the stairs and use them. Fix handrails that are broken or loose. Make sure that handrails are as long as the stairways. Check any carpeting to make sure that it is firmly attached to the stairs. Fix any carpet that is loose or worn. Avoid having throw rugs at the top or bottom of the stairs. If you do have throw rugs, attach them to the floor with carpet tape. Make sure that you have a light switch at the top of the stairs and the bottom of the stairs. If you do not have them, ask someone to add them for you. What else can I do to help prevent falls? Wear shoes that: Do not have high heels. Have rubber bottoms. Are comfortable and fit you well. Are closed at the toe. Do not wear sandals. If you use a stepladder: Make sure that it is fully opened. Do not climb a closed stepladder. Make sure that both sides of the stepladder are locked into place. Ask someone to hold it for you, if possible. Clearly mark and make sure that you can see: Any grab bars or handrails. First and last steps. Where the edge of each step is. Use tools that help you move around (mobility aids) if they are needed. These include: Canes. Walkers. Scooters. Crutches. Turn on the lights when you go into a dark area. Replace any light bulbs as soon as they burn out. Set up your furniture so you have a clear path. Avoid moving your furniture around. If any of your  floors are uneven, fix them. If there are any pets around you, be aware of where they are. Review your medicines with your doctor. Some medicines can make you feel dizzy. This can increase your chance of falling. Ask your doctor what other things that you can do to help prevent falls. This information is not intended to replace advice given to you by your health  care provider. Make sure you discuss any questions you have with your health care provider. Document Released: 02/12/2009 Document Revised: 09/24/2015 Document Reviewed: 05/23/2014 Elsevier Interactive Patient Education  2017 [employer].

## 2022-05-12 NOTE — Progress Notes (Addendum)
I connected with  Walter [name] on 05/12/22 by a audio enabled telemedicine application and verified that I am speaking with the correct person using two identifiers.  Patient Location: Home  Provider Location: Office/Clinic  I discussed the limitations of evaluation and management by telemedicine. The patient expressed understanding and agreed to proceed.   Subjective:   Walter [name] is a 82 y.o. male who presents for Medicare Annual/Subsequent preventive examination.  Review of Systems     Cardiac Risk Factors include: advanced age (>32mn, >>19women);hypertension;diabetes mellitus;dyslipidemia;male gender     Objective:    Today's Vitals   05/12/22 1006  Weight: 192 lb (87.1 kg)   Body mass index is 27.55 kg/m.     05/12/2022   10:13 AM 03/07/2022    3:10 PM 01/27/2022    1:10 AM 09/02/2021   10:47 AM 05/04/2021   10:50 AM 03/05/2021    8:32 AM 04/13/2020   11:44 AM  Advanced Directives  Does Patient Have a Medical Advance Directive? Yes Yes Yes Yes Yes Yes Yes  Type of [place]  [place]  Healthcare Power of [place]  Does patient want to make changes to medical advance directive?   No - Patient declined      Copy of [place] Chart? No - copy requested    No - copy requested      Current Medications (verified) Outpatient Encounter Medications as of 05/12/2022  Medication Sig   amLODipine (NORVASC) 5 MG tablet TAKE 1 TABLET BY MOUTH EVERY DAY (Patient taking differently: Take 5 mg by mouth daily.)   [employer] STOMACH RELIEF 262 MG chewable tablet Chew 2 tablets by mouth 4 (four) times daily.   donepezil (ARICEPT) 10 MG tablet TAKE 1 TABLET BY MOUTH EVERYDAY AT BEDTIME (Patient taking differently: Take 10 mg by mouth at bedtime.)   ferrous sulfate 325 (65 FE) MG tablet Take 1 tablet (325 mg total) by mouth 2 (two) times daily with a meal.    glipiZIDE (GLUCOTROL) 10 MG tablet TAKE 1 TABLET (10 MG TOTAL) BY MOUTH TWICE A DAY BEFORE A MEAL   Glucagon (GVOKE HYPOPEN 2-PACK) 0.5 MG/0.1ML SOAJ Inject 0.5 mg into the skin daily as needed (for low blood sugar).   Lutein 20 MG TABS Take 20 mg by mouth daily.   metFORMIN (GLUCOPHAGE) 1000 MG tablet TAKE 1 TABLET BY MOUTH 2 TIMES DAILY WITH A MEAL. (Patient taking differently: Take 1,000 mg by mouth 2 (two) times daily with a meal.)   Multiple Vitamins-Minerals (CENTRUM SILVER PO) Take 1 tablet by mouth daily.    ONETOUCH ULTRA test strip USE ONCE DAILY AS INSTRUCTED   rosuvastatin (CRESTOR) 20 MG tablet TAKE 1 TABLET BY MOUTH ONE TIME PER WEEK (Patient taking differently: Take 20 mg by mouth once a week.)   sitaGLIPtin (JANUVIA) 100 MG tablet Take 1 tablet (100 mg total) by mouth daily.   telmisartan (MICARDIS) 40 MG tablet TAKE 1 TABLET BY MOUTH EVERY DAY   VITAMIN A PO Take 1 tablet by mouth daily.   pantoprazole (PROTONIX) 40 MG tablet Take 1 tablet (40 mg total) by mouth 2 (two) times daily.   No facility-administered encounter medications on file as of 05/12/2022.    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 All started after a hemorrhoid surgery and later with urinary retention. Patient at one point was septic from urinary issues.     Diabetes mellitus type II, controlled 04/23/2007    Metformin 1g BID, amaryl '4mg'$ --> '8mg'$ , had to add [place] back  Never took victoza.   Januvia-lethargic and dizzy in past. 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   History of colon cancer    Tubular adenoma of colon; s/p colectomy 1992    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   History of shingles    Hyperlipidemia associated with type 2 diabetes mellitus 04/23/2007   Atorvastatin '20mg'$  once weekly     Hypertension associated with diabetes 04/23/2007   Amlodipine '5mg'$ , telmisartan '40mg'$      Iritis 12/17/2008   Shingles 2010    Mild neurocognitive disorder 12/23/2019   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   Obstructive sleep apnea 04/23/2007   uses CPAP nightly   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   Sepsis secondary to UTI 03/29/2015   Past Surgical History:  Procedure Laterality Date   AMPUTATION TOE Right 09/14/2016   Procedure: RIGHT 2ND TOE/RAY AMPUTATION;  Surgeon: Leandrew Koyanagi, MD;  Location: [facility];  Service: Orthopedics;  Laterality: Right;   APPENDECTOMY  05/03/1947   BIOPSY  01/28/2022   Procedure: BIOPSY;  Surgeon: Doran Stabler, MD;  Location: [place];  Service: Gastroenterology;;   COLECTOMY  05/02/1990   CYSTOSCOPY N/A 06/21/2015   Procedure: CYSTOSCOPY, CLOT EVACUATION, AND CAUTERIZATION OF PROSTATE FOSSA;  Surgeon: Carolan Clines, MD;  Location: [facility];  Service: Urology;  Laterality: N/A;   ESOPHAGOGASTRODUODENOSCOPY (EGD) WITH PROPOFOL N/A 01/26/2022   Procedure: ESOPHAGOGASTRODUODENOSCOPY (EGD) WITH PROPOFOL;  Surgeon: Doran Stabler, MD;  Location: [place];  Service: Gastroenterology;  Laterality: N/A;   ESOPHAGOGASTRODUODENOSCOPY (EGD) WITH PROPOFOL N/A 01/28/2022   Procedure: ESOPHAGOGASTRODUODENOSCOPY (EGD) WITH PROPOFOL;  Surgeon: Doran Stabler, MD;  Location: [place];  Service: Gastroenterology;  Laterality: N/A;   HEMORRHOID SURGERY     HEMOSTASIS CLIP PLACEMENT  01/26/2022   Procedure: HEMOSTASIS CLIP PLACEMENT;  Surgeon: Doran Stabler, MD;  Location: [facility];   Service: Gastroenterology;;   HEMOSTASIS CONTROL  01/26/2022   Procedure: HEMOSTASIS CONTROL;  Surgeon: Doran Stabler, MD;  Location: [place];  Service: Gastroenterology;;   HOT HEMOSTASIS N/A 01/26/2022   Procedure: HOT HEMOSTASIS (ARGON PLASMA COAGULATION/BICAP);  Surgeon: Doran Stabler, MD;  Location: [place];  Service: Gastroenterology;  Laterality: N/A;   MOLE REMOVAL  2022   hand   SCLEROTHERAPY  01/26/2022   Procedure: SCLEROTHERAPY;  Surgeon: Doran Stabler, MD;  [facility];  Service: Gastroenterology;;   TOE SURGERY  04/2020   TRANSURETHRAL RESECTION OF PROSTATE N/A 05/23/2015   Procedure: TRANSURETHRAL RESECTION OF THE PROSTATE (TURP);  Surgeon: Irine Seal, MD;  Location[facility];  Service: Urology;  Laterality: N/A;   Family History  Problem Relation Age of Onset   Alzheimer's disease Mother    Colon cancer Father 1   Diabetes Father        type  I   Colon cancer Paternal Grandmother 85   Rectal cancer Maternal Uncle    Cancer Other        colon/fhx   Esophageal cancer Neg Hx    Stomach cancer Neg Hx    Social History   Socioeconomic History   Marital status: Married    Spouse name: Not on file   Number of children: 2   Years of education: 28   Highest education level: Master's degree (e.g., MA, MS, MEng, MEd, MSW, MBA)  Occupational History   Occupation: Retired  Tobacco Use   Smoking status: Former    Packs/day: 0.25    Years: 10.00    Total pack years: 2.50    Types: Pipe, Cigarettes   Smokeless tobacco: Never  Vaping Use   Vaping Use: Never used  Substance and Sexual Activity   Alcohol use: Yes    Alcohol/week: 2.0 standard drinks of alcohol    Types: 2 Cans of beer per week    Comment: social   Drug use: No   Sexual activity: Not Currently  Other Topics Concern   Not on file  Social History Narrative   Married 1966. 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)      No religious beliefs affecting health care, no afterlife beliefs      Right Handed      Two Story Home   Social Determinants of Health   Financial Resource Strain: Low Risk  (05/12/2022)   Overall Financial Resource Strain (CARDIA)    Difficulty of Paying Living Expenses: Not hard at all  Food Insecurity: No Food Insecurity (05/12/2022)   Hunger Vital Sign    Worried About Running Out of Food in the Last Year: Never true    Ran Out of Food in the Last Year: Never true  Transportation Needs: No Transportation Needs (05/12/2022)   PRAPARE - Hydrologist (Medical): No    Lack of Transportation (Non-Medical): No  Physical Activity: Sufficiently Active (05/12/2022)   Exercise Vital Sign    Days of Exercise per Week: 3 days    Minutes of Exercise per Session: 60 min  Stress: No Stress Concern Present (05/12/2022)   [place]    Feeling of Stress : Not at all  Social Connections: Moderately Integrated (05/12/2022)   Social Connection and Isolation Panel [NHANES]    Frequency of Communication with Friends and Family: Three times a week    Frequency of Social Gatherings with Friends and Family: Three times a week    Attends Religious Services: Never    Active Member of Clubs or Organizations: Yes    Attends Archivist Meetings: 1 to 4 times per year    Marital Status: Married    Tobacco Counseling Counseling given: Not Answered   Clinical Intake:  Pre-visit preparation completed: Yes  Pain : No/denies pain     BMI - recorded: 27.55 Nutritional Status: BMI 25 -29 Overweight Nutritional Risks: None Diabetes: Yes CBG done?: Yes (150 per pt) CBG resulted in Enter/ Edit  results?: No Did pt. bring in CBG monitor from home?: No  How often do you need to have someone help  you when you read instructions, pamphlets, or other written materials from your doctor or pharmacy?: 1 - Never  Diabetic?Nutrition Risk Assessment:  Has the patient had any N/V/D within the last 2 months?  No  Does the patient have any non-healing wounds?  No  Has the patient had any unintentional weight loss or weight gain?  No   Diabetes:  Is the patient diabetic?  Yes  If diabetic, was a CBG obtained today?  Yes  Did the patient bring in their glucometer from home?  No  How often do you monitor your CBG's? Daily .   Financial Strains and Diabetes Management:  Are you having any financial strains with the device, your supplies or your medication? No .  Does the patient want to be seen by Chronic Care Management for management of their diabetes?  No  Would the patient like to be referred to a Nutritionist or for Diabetic Management?  No   Diabetic Exams:  Diabetic Eye Exam: Completed 01/10/22 Diabetic Foot Exam: Completed 01/31/22  Interpreter Needed?: No  Information entered by :: Charlott Rakes, LPN   Activities of Daily Living    05/12/2022   10:14 AM 01/27/2022    1:10 AM  In your present state of health, do you have any difficulty performing the following activities:  Hearing? 0 0  Vision? 0 0  Difficulty concentrating or making decisions? 0 0  Walking or climbing stairs? 0 0  Dressing or bathing? 0 0  Doing errands, shopping? 0 0  Preparing Food and eating ? N   Using the Toilet? N   In the past six months, have you accidently leaked urine? N   Do you have problems with loss of bowel control? N   Managing your Medications? N   Managing your Finances? N   Housekeeping or managing your Housekeeping? N     Patient Care Team: [name], MD as PCP - General (Family Medicine) Michael Boston, MD as Consulting Physician (General Surgery) Ladene Artist, MD  as Consulting Physician (Gastroenterology) Irine Seal, MD as Attending Physician (Urology) Shon Hough, MD as Consulting Physician (Ophthalmology) Cameron Sprang, MD as Consulting Physician (Neurology)  Indicate any recent Medical Services you may have received from other than [facility] providers in the past year (date may be approximate).     Assessment:   This is a routine wellness examination for [place].  Hearing/Vision screen Hearing Screening - Comments:: Pt denies any hearing  Vision Screening - Comments:: Pt follows up with Dr at Dr Kathrin Penner office for annual eye exams   Dietary issues and exercise activities discussed: Current Exercise Habits: Home exercise routine, Type of exercise: walking, Time (Minutes): 60, Frequency (Times/Week): 3, Weekly Exercise (Minutes/Week): 180   Goals Addressed             This Visit's Progress    Patient Stated       Stay healthy and active        Depression Screen    05/12/2022   10:12 AM 05/04/2021   10:48 AM 03/02/2021    9:16 AM 10/27/2020    8:04 AM 04/13/2020   11:41 AM 08/19/2019    8:46 AM 03/11/2019   11:18 AM  PHQ 2/9 Scores  PHQ - 2 Score 0 0 0 0 0 0 0    Fall Risk    05/12/2022   10:14 AM 03/07/2022    3:10 PM 09/02/2021   10:47 AM 05/04/2021   10:51 AM 03/05/2021  8:32 AM  Fall Risk   Falls in the past year? 0 0 0 0 0  Number falls in past yr: 0 0 0 0 0  Injury with Fall? 0 0 0 0 0  Risk for fall due to :    Impaired vision   Follow up Falls prevention discussed Falls evaluation completed  Falls prevention discussed     FALL RISK PREVENTION PERTAINING TO THE HOME:  Any stairs in or around the home? Yes  If so, are there any without handrails? No  Home free of loose throw rugs in walkways, pet beds, electrical cords, etc? Yes  Adequate lighting in your home to reduce risk of falls? Yes   ASSISTIVE DEVICES UTILIZED TO PREVENT FALLS:  Life alert? Yes  Use of a cane, walker or w/c? No  Grab bars in the  bathroom? Yes  Shower chair or bench in shower? Yes  Elevated toilet seat or a handicapped toilet? Yes   TIMED UP AND GO:  Was the test performed? No .   Cognitive Function:    09/02/2021   11:00 AM  MMSE - Mini Mental State Exam  Orientation to time 5  Orientation to Place 5  Registration 3  Attention/ Calculation 5  Recall 0  Language- name 2 objects 2  Language- repeat 1  Language- follow 3 step command 3  Language- read & follow direction 1  Write a sentence 1  Copy design 1  Total score 27        05/12/2022   10:15 AM 04/13/2020   11:47 AM 03/11/2019   11:19 AM 12/26/2017    9:17 AM  6CIT Screen  What Year? 0 points 0 points 0 points 0 points  What month? 0 points 0 points 0 points 0 points  What time? 0 points  0 points 0 points  Count back from 20 0 points 0 points 0 points 0 points  Months in reverse 4 points 0 points 0 points 0 points  Repeat phrase 4 points 2 points 0 points   Total Score 8 points  0 points     Immunizations Immunization History  Administered Date(s) Administered   Fluad Quad(high Dose 65+) 01/08/2019, 01/16/2020, 02/25/2021, 02/04/2022   Influenza Split 01/31/2011, 01/16/2012   Influenza Whole 03/07/2007, 01/31/2008, 01/30/2009   Influenza, High Dose Seasonal PF 01/11/2017, 02/05/2018   Influenza,inj,Quad PF,6+ Mos 01/21/2013, 01/27/2014, 12/26/2014   Influenza-Unspecified 01/05/2016   PFIZER(Purple Top)SARS-COV-2 Vaccination 05/24/2019, 06/14/2019, 02/02/2020   Pfizer Covid-19 Vaccine Bivalent Booster 42yr & up 02/15/2021   Pneumococcal Conjugate-13 08/19/2013   Pneumococcal Polysaccharide-23 04/04/2007   Td 04/04/2007, 09/14/2017   Varicella 08/19/2013    TDAP status: Up to date  Flu Vaccine status: Up to date  Pneumococcal vaccine status: Up to date  Covid-19 vaccine status: Completed vaccines  Qualifies for Shingles Vaccine? Yes   Zostavax completed No   Shingrix Completed?: No.    Education has been provided regarding  the importance of this vaccine. Patient has been advised to call insurance company to determine out of pocket expense if they have not yet received this vaccine. Advised may also receive vaccine at local pharmacy or Health Dept. Verbalized acceptance and understanding.  Screening Tests Health Maintenance  Topic Date Due   COVID-19 Vaccine (5 - 2023-24 season) 12/31/2021   Zoster Vaccines- Shingrix (1 of 2) 07/21/2022 (Originally 11/19/1959)   HEMOGLOBIN A1C  10/21/2022   OPHTHALMOLOGY EXAM  01/11/2023   FOOT EXAM  02/01/2023   Diabetic kidney evaluation -  Urine ACR  02/05/2023   Diabetic kidney evaluation - eGFR measurement  04/22/2023   Medicare Annual Wellness (AWV)  05/13/2023   DTaP/Tdap/Td (3 - Tdap) 09/15/2027   Pneumonia Vaccine 48+ Years old  Completed   INFLUENZA VACCINE  Completed   HPV VACCINES  Aged Out   COLONOSCOPY (Pts 45-63yr Insurance coverage will need to be confirmed)  Discontinued    Health Maintenance  Health Maintenance Due  Topic Date Due   COVID-19 Vaccine (5 - 2023-24 season) 12/31/2021    Colorectal cancer screening: No longer required.    Additional Screening:   Vision Screening: Recommended annual ophthalmology exams for early detection of glaucoma and other disorders of the eye. Is the patient up to date with their annual eye exam?  Yes  Who is the provider or what is the name of the office in which the patient attends annual eye exams? Dr [facility]  If pt is not established with a provider, would they like to be referred to a provider to establish care? No .   Dental Screening: Recommended annual dental exams for proper oral hygiene  Community Resource Referral / Chronic Care Management: CRR required this visit?  No   CCM required this visit?  No      Plan:     I have personally reviewed and noted the following in the patient's chart:   Medical and social history Use of alcohol, tobacco or illicit drugs  Current medications  and supplements including opioid prescriptions. Patient is not currently taking opioid prescriptions. Functional ability and status Nutritional status Physical activity Advanced directives List of other physicians Hospitalizations, surgeries, and ER visits in previous 12 months Vitals Screenings to include cognitive, depression, and falls Referrals and appointments  In addition, I have reviewed and discussed with patient certain preventive protocols, quality metrics, and best practice recommendations. A written personalized care plan for preventive services as well as general preventive health recommendations were provided to patient.     TWillette Brace LPN   [id number]/59/[id number]  Nurse Notes: none

## 2022-05-25 ENCOUNTER — Ambulatory Visit (INDEPENDENT_AMBULATORY_CARE_PROVIDER_SITE_OTHER): Payer: Medicare Other

## 2022-05-25 DIAGNOSIS — Z89421 Acquired absence of other right toe(s): Secondary | ICD-10-CM

## 2022-05-25 DIAGNOSIS — E1149 Type 2 diabetes mellitus with other diabetic neurological complication: Secondary | ICD-10-CM

## 2022-05-25 DIAGNOSIS — M2031 Hallux varus (acquired), right foot: Secondary | ICD-10-CM

## 2022-05-25 NOTE — Progress Notes (Signed)
Patient presents today to pick up diabetic shoes and insoles.  Patient was dispensed 1 pair of diabetic shoes and 3 pairs of foam casted diabetic insoles.   He tried on the shoes with the insoles and the fit was satisfactory.   Will follow up next year for new order.    

## 2022-06-21 ENCOUNTER — Other Ambulatory Visit: Payer: Self-pay | Admitting: Neurology

## 2022-06-21 ENCOUNTER — Other Ambulatory Visit: Payer: Self-pay | Admitting: Family Medicine

## 2022-07-05 ENCOUNTER — Telehealth: Payer: Self-pay

## 2022-07-05 NOTE — Telephone Encounter (Signed)
MyChart message sent to patient with lab reminder.  

## 2022-07-05 NOTE — Telephone Encounter (Signed)
-----   Message from Yevette Edwards, RN sent at 04/05/2022 12:33 PM EST ----- Regarding: Labs CBC and IBC + Ferritin due - orders are in epic

## 2022-07-06 NOTE — Telephone Encounter (Signed)
Spoke with patient to remind him that he is due for repeat labs at this time. No appointment is necessary. Patient is aware that he can stop by the lab in the basement at his convenience between 7:30 AM - 5 PM, Monday through Friday. Patient verbalized understanding and had no concerns at the end of the call.   

## 2022-07-07 ENCOUNTER — Other Ambulatory Visit (INDEPENDENT_AMBULATORY_CARE_PROVIDER_SITE_OTHER): Payer: Medicare Other

## 2022-07-07 DIAGNOSIS — D62 Acute posthemorrhagic anemia: Secondary | ICD-10-CM | POA: Diagnosis not present

## 2022-07-07 DIAGNOSIS — K264 Chronic or unspecified duodenal ulcer with hemorrhage: Secondary | ICD-10-CM

## 2022-07-07 DIAGNOSIS — A048 Other specified bacterial intestinal infections: Secondary | ICD-10-CM | POA: Diagnosis not present

## 2022-07-07 DIAGNOSIS — K922 Gastrointestinal hemorrhage, unspecified: Secondary | ICD-10-CM | POA: Diagnosis not present

## 2022-07-07 LAB — CBC WITH DIFFERENTIAL/PLATELET
Basophils Absolute: 0 10*3/uL (ref 0.0–0.1)
Basophils Relative: 0.4 % (ref 0.0–3.0)
Eosinophils Absolute: 0.1 10*3/uL (ref 0.0–0.7)
Eosinophils Relative: 1.2 % (ref 0.0–5.0)
HCT: 40.7 % (ref 39.0–52.0)
Hemoglobin: 13.9 g/dL (ref 13.0–17.0)
Lymphocytes Relative: 12.5 % (ref 12.0–46.0)
Lymphs Abs: 1.2 10*3/uL (ref 0.7–4.0)
MCHC: 34.2 g/dL (ref 30.0–36.0)
MCV: 91.9 fl (ref 78.0–100.0)
Monocytes Absolute: 0.6 10*3/uL (ref 0.1–1.0)
Monocytes Relative: 6.6 % (ref 3.0–12.0)
Neutro Abs: 7.4 10*3/uL (ref 1.4–7.7)
Neutrophils Relative %: 79.3 % — ABNORMAL HIGH (ref 43.0–77.0)
Platelets: 241 10*3/uL (ref 150.0–400.0)
RBC: 4.43 Mil/uL (ref 4.22–5.81)
RDW: 15.2 % (ref 11.5–15.5)
WBC: 9.4 10*3/uL (ref 4.0–10.5)

## 2022-07-07 LAB — IBC + FERRITIN
Ferritin: 34 ng/mL (ref 22.0–322.0)
Iron: 82 ug/dL (ref 42–165)
Saturation Ratios: 25 % (ref 20.0–50.0)
TIBC: 327.6 ug/dL (ref 250.0–450.0)
Transferrin: 234 mg/dL (ref 212.0–360.0)

## 2022-08-02 ENCOUNTER — Ambulatory Visit (INDEPENDENT_AMBULATORY_CARE_PROVIDER_SITE_OTHER): Payer: Medicare Other | Admitting: Podiatry

## 2022-08-02 DIAGNOSIS — E1149 Type 2 diabetes mellitus with other diabetic neurological complication: Secondary | ICD-10-CM

## 2022-08-02 DIAGNOSIS — M79675 Pain in left toe(s): Secondary | ICD-10-CM | POA: Diagnosis not present

## 2022-08-02 DIAGNOSIS — B351 Tinea unguium: Secondary | ICD-10-CM

## 2022-08-02 DIAGNOSIS — M79674 Pain in right toe(s): Secondary | ICD-10-CM | POA: Diagnosis not present

## 2022-08-02 NOTE — Progress Notes (Signed)
Subjective: Chief Complaint  Patient presents with   diabetic foot care     82 year old male presents the office today with above concerns.  Denies any open sores. No other concerns today.  Objective: AAO x3, NAD DP/PT pulses palpable bilaterally, CRT less than 3 seconds Nails are hypertrophic, dystrophic, brittle, discolored, elongated 10. No surrounding redness or drainage. Tenderness nails 1-5 bilaterally. No open lesions or pre-ulcerative lesions are identified today. No pain with calf compression, swelling, warmth, erythema  Assessment: 82 year old male with symptomatic onychomycosis  Plan: -All treatment options discussed with the patient including all alternatives, risks, complications.  -Sharply debrided the nails x 10 without any complications or bleeding. -Daily foot inspection. -Measured for diabetic shoes today. -Patient encouraged to call the office with any questions, concerns, change in symptoms.   Trula Slade DPM

## 2022-08-22 ENCOUNTER — Encounter: Payer: Self-pay | Admitting: Family Medicine

## 2022-08-22 ENCOUNTER — Ambulatory Visit: Payer: Medicare Other | Admitting: Family Medicine

## 2022-08-22 VITALS — BP 154/88 | HR 64 | Temp 98.0°F | Resp 16 | Ht 72.0 in | Wt 198.6 lb

## 2022-08-22 DIAGNOSIS — I10 Essential (primary) hypertension: Secondary | ICD-10-CM | POA: Diagnosis not present

## 2022-08-22 DIAGNOSIS — E1169 Type 2 diabetes mellitus with other specified complication: Secondary | ICD-10-CM | POA: Diagnosis not present

## 2022-08-22 DIAGNOSIS — E785 Hyperlipidemia, unspecified: Secondary | ICD-10-CM | POA: Diagnosis not present

## 2022-08-22 DIAGNOSIS — E119 Type 2 diabetes mellitus without complications: Secondary | ICD-10-CM

## 2022-08-22 NOTE — Progress Notes (Signed)
Phone [phone] In person visit   Subjective:   Walter [name] is a 82 y.o. year old very pleasant male patient who presents for/with See problem oriented charting Chief Complaint  Patient presents with   Medical Management of Chronic Issues   Nasal Congestion   Diabetes    Wanting to discuss Januvia and Jardiance    Past Medical History-  Patient Active Problem List   Diagnosis Date Noted   Acute upper GI bleeding 01/26/2022    Priority: High   Duodenal ulcer hemorrhage     Priority: High   Mild neurocognitive disorder 12/23/2019    Priority: High   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    Priority: High   BPH (benign prostatic hyperplasia) 07/02/2015    Priority: High   Diabetes mellitus type II, controlled 04/23/2007    Priority: High   History of colon cancer 01/27/2014    Priority: Medium    Hyperlipidemia associated with type 2 diabetes mellitus 04/23/2007    Priority: Medium    Obstructive sleep apnea 04/23/2007    Priority: Medium    Essential hypertension 04/23/2007    Priority: Medium    Allergic rhinitis 08/19/2019    Priority: Low   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    Priority: Low   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    Priority: Low   Hallux valgus of right foot 03/26/2014    Priority: Low   Iritis 12/17/2008    Priority: Low   Postherpetic neuralgia 05/22/2008    Priority: Low   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    Priority: Low   Bigeminy 04/21/2008    Priority: Low   Pulmonary nodule 02/04/2022   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     donepezil (ARICEPT) 10 MG tablet TAKE 1 TABLET BY MOUTH  EVERYDAY AT BEDTIME 90 tablet 1   ferrous sulfate 325 (65 FE) MG tablet Take 1 tablet (325 mg total) by mouth 2 (two) times daily with a meal.     glipiZIDE (GLUCOTROL) 10 MG tablet TAKE 1 TABLET (10 MG TOTAL) BY MOUTH TWICE A DAY BEFORE A MEAL 180 tablet 3   Glucagon (GVOKE HYPOPEN 2-PACK) 0.5 MG/0.1ML SOAJ Inject 0.5 mg into the skin daily as needed (for low blood sugar). 0.2 mL 1   Lutein 20 MG TABS Take 20 mg by mouth daily.     metFORMIN (GLUCOPHAGE) 1000 MG tablet TAKE 1 TABLET BY MOUTH 2 TIMES DAILY WITH A MEAL. (Patient taking differently: Take 1,000 mg by mouth 2 (two) times daily with a meal.) 180 tablet 3   Multiple Vitamins-Minerals (CENTRUM SILVER PO) Take 1 tablet by mouth daily.      ONETOUCH ULTRA test strip USE ONCE DAILY AS INSTRUCTED 100 strip 3   rosuvastatin (CRESTOR) 20 MG tablet TAKE 1 TABLET BY MOUTH ONE TIME PER WEEK 13 tablet 3   sitaGLIPtin (JANUVIA) 100 MG tablet Take 1 tablet (100 mg total) by mouth daily. 90 tablet 3   telmisartan (MICARDIS) 40 MG tablet TAKE 1 TABLET BY MOUTH EVERY DAY 90 tablet 3   VITAMIN A PO Take 1 tablet by mouth daily.  pantoprazole (PROTONIX) 40 MG tablet Take 1 tablet (40 mg total) by mouth 2 (two) times daily. 60 tablet 2   No current facility-administered medications for this visit.     Objective:  BP (!) 154/88   Pulse 64   Temp 98 F (36.7 C) (Temporal)   Resp 16   Ht 6' (1.829 m)   Wt 198 lb 9.6 oz (90.1 kg)   SpO2 94%   BMI 26.94 kg/m  Gen: NAD, resting comfortably Sinuses nontender, tympanic membrane's normal bilaterally, pharynx with mild drainage but otherwise oropharynx normal, nasal turbinates edematous and pale consistent with allergies CV: RRR no murmurs rubs or gallops Lungs: CTAB no crackles, wheeze, rhonchi Skin: warm, dry    Assessment and Plan   # Nasal Congestion S:2-3 months of nasal congestion and post nasal drip that comes and goes. No sinus pressure.  No allergy medicine A/P: exam benign. Advised  trial of claritin or allegra before bed - could even do just half tablet- allergies could be contributing of the congestion issues given duration. If symptoms worsen please see [place] back  -lungs clear today, sinuses areas not tender.  I offered x-ray but he wanted to hold off for now- let me know if you change your mind.  Also asked him to update me in a week when he reached out with blood pressure readings-could add Flonase  #Mild cognitive impairment-follows with Dr. Karel Jarvis S: Medication: Donepezil 10 mg. Reports relatively stable A/P: stable- continue current medicines   and neurology follow up    # Diabetes-poorly controlled at times  S: Medication:glipizide 10 mg twice daily, januvia  daily, metformin   twice daily  CBGs- 140-170 at home Exercise and diet- staying very active and does his own yard work. Weight up a couple lbs from last visit- similar on home scal  Lab Results  Component Value Date   HGBA1C 8.3 (H) 04/21/2022   HGBA1C 7.2 (A) 11/05/2021   HGBA1C 7.4 (H) 07/02/2021  A/P: hopefully improved- update a1c today. Continue current meds for now- if a1c still above 8 -discussed add on jardiance 10 mg- he may want to hold off due to expense- pr   #hypertension S: medication: Telmisartan 40 mg every day, amlodipine 5 mg every day Home readings #s: not many checks recently BP Readings from Last 3 Encounters:  08/22/22 (!) 154/88  05/03/22 (!) 149/75  04/21/22 124/60  A/P: blood pressure higher last 2 checks - here and podiatry. Last visit when I saw you it looked great- id like for you to do some home checks daily over next week and send me your results- we may need to tweak meds  #hyperlipidemia S: Medication: Rosuvastatin 20 mg once a week Lab Results  Component Value Date   CHOL 120 04/21/2022   HDL 30.20 (L) 04/21/2022   LDLCALC 59 04/21/2022   LDLDIRECT 90.0 09/14/2017   TRIG 156.0 (H) 04/21/2022   CHOLHDL 4 04/21/2022  A/P: stable- continue current  medicines   #Pancreatic cyst- close to yearly check up next visit- will plan on checking then   Recommended follow up: Return in about 4 months (around 12/22/2022) for followup or sooner if needed.Schedule b4 you leave. Future Appointments  Date Time Provider Department Center  09/20/2022  3:00 PM Elwyn Reach [facility] None  11/01/2022  8:45 AM Vivi Barrack, DPM [facility]  12/12/2022  8:30 AM [name], PhD [facility] None  12/12/2022  9:30 AM [facility] None  12/20/2022  10:00 AM [name], PhD [facility] None  05/18/2023  9:45 AM [facility] ANNUAL WELLNESS VISIT 1 [facility]   Lab/Order associations:   ICD-10-CM   1. Controlled type 2 diabetes mellitus without complication, without long-term current use of insulin  E11.9     2. Essential hypertension  I10     3. Hyperlipidemia associated with type 2 diabetes mellitus  E11.69    E78.5       No orders of the defined types were placed in this encounter.   Return precautions advised.  Tana Conch, MD

## 2022-08-22 NOTE — Patient Instructions (Addendum)
Please check with your pharmacy to see if they have the shingrix vaccine. If they do- please get this immunization and update [place] by phone call or mychart with dates you receive the vaccine  Advised trial of claritin or allegra before bed - could even do just half tablet- allergies could be contributing of the congestion issues given duration. If symptoms worsen please see [place] back  -lungs clear today, sinuses areas not tender. Offered x-ray but wanted to hold off for now- let me know if you change your mind   blood pressure higher last 2 checks - here and podiatry. Last visit when I saw you it looked great- id like for you to do some home checks daily over next week and send me your results- we may need to tweak medications  Consider doing some more beans since they have helped you in the past  Im sorry our lab was not available today - please schedule visit tomorrow to collect labs

## 2022-08-23 ENCOUNTER — Other Ambulatory Visit (INDEPENDENT_AMBULATORY_CARE_PROVIDER_SITE_OTHER): Payer: Medicare Other

## 2022-08-23 DIAGNOSIS — E119 Type 2 diabetes mellitus without complications: Secondary | ICD-10-CM | POA: Diagnosis not present

## 2022-08-23 NOTE — Addendum Note (Signed)
Addended by: Nash Shearer D on: 08/23/2022 08:48 AM   Modules accepted: Orders

## 2022-08-24 LAB — HEMOGLOBIN A1C
Hgb A1c MFr Bld: 7.8 % of total Hgb — ABNORMAL HIGH (ref <5.7)
Mean Plasma Glucose: 177 mg/dL
eAG (mmol/L): 9.8 mmol/L

## 2022-08-24 LAB — COMPREHENSIVE METABOLIC PANEL
AG Ratio: 1.8 (calc) (ref 1.0–2.5)
ALT: 23 U/L (ref 9–46)
AST: 17 U/L (ref 10–35)
Albumin: 4.8 g/dL (ref 3.6–5.1)
Alkaline phosphatase (APISO): 66 U/L (ref 35–144)
BUN: 13 mg/dL (ref 7–25)
CO2: 25 mmol/L (ref 20–32)
Calcium: 9.9 mg/dL (ref 8.6–10.3)
Chloride: 101 mmol/L (ref 98–110)
Creat: 0.92 mg/dL (ref 0.70–1.22)
Globulin: 2.7 g/dL (calc) (ref 1.9–3.7)
Glucose, Bld: 183 mg/dL — ABNORMAL HIGH (ref 65–99)
Potassium: 4.6 mmol/L (ref 3.5–5.3)
Sodium: 139 mmol/L (ref 135–146)
Total Bilirubin: 0.7 mg/dL (ref 0.2–1.2)
Total Protein: 7.5 g/dL (ref 6.1–8.1)

## 2022-08-25 ENCOUNTER — Other Ambulatory Visit: Payer: Self-pay

## 2022-08-29 ENCOUNTER — Encounter: Payer: Self-pay | Admitting: Family Medicine

## 2022-09-01 ENCOUNTER — Other Ambulatory Visit: Payer: Self-pay | Admitting: Family Medicine

## 2022-09-12 NOTE — Telephone Encounter (Signed)
ADMIN please see below regarding scheduling and contact pt.

## 2022-09-13 NOTE — Telephone Encounter (Signed)
LVM to scheduled ov

## 2022-09-20 ENCOUNTER — Ambulatory Visit: Payer: Medicare Other | Admitting: Physician Assistant

## 2022-09-20 ENCOUNTER — Encounter: Payer: Self-pay | Admitting: Physician Assistant

## 2022-09-20 VITALS — BP 134/65 | HR 74 | Resp 18 | Ht 72.0 in

## 2022-09-20 DIAGNOSIS — R413 Other amnesia: Secondary | ICD-10-CM

## 2022-09-20 DIAGNOSIS — R479 Unspecified speech disturbances: Secondary | ICD-10-CM | POA: Diagnosis not present

## 2022-09-20 DIAGNOSIS — F419 Anxiety disorder, unspecified: Secondary | ICD-10-CM | POA: Diagnosis not present

## 2022-09-20 DIAGNOSIS — G3184 Mild cognitive impairment, so stated: Secondary | ICD-10-CM

## 2022-09-20 DIAGNOSIS — F32A Depression, unspecified: Secondary | ICD-10-CM | POA: Diagnosis not present

## 2022-09-20 NOTE — Progress Notes (Addendum)
Assessment/Plan:   Memory Impairment   Walter [name] is a very pleasant 82 y.o. RH male with a history of hypertension, hyperlipidemia, diabetes, sleep apnea on CPAP, colon cancer s/p resection, prior history of H. 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  Neuropsychological evaluation in August 2022 presenting today in follow-up for evaluation of memory loss. Patient is on donepezil 10 mg daily, tolerating well.  Memory is stable, today's MMSE is 26/30.  Patient is scheduled for repeat neuropsychological evaluation in late August 2024 for clarity of the diagnosis.  He still experiencing significant anxiety, and situational depression related to memory difficulties.  He also has been having some recurrent dreams of his life in [place], possible PTSD.  We discussed again psychotherapy, at this time he agreed to be referred to behavioral health, which indeed could help from the cognitive standpoint.       Recommendations:   Follow up in   6 months. Continue donepezil 10 mg daily, side effects discussed. Repeat MRI of the brain prior to his scheduled neuropsychological evaluation for an update on any structural abnormalities and vascular load. Repeat neuropsychological evaluation, for clarity of diagnosis and disease trajectory this is scheduled for August 2024 Referral to speech therapy Referral for psychotherapy for depression and anxiety, possible PTSD Recommend good control of cardiovascular risk factors Continue to control mood as per PCP     Subjective:   This patient is accompanied in the office by his wife who supplements the history. Previous records as well as any outside records available were reviewed prior to todays visit.   Patient was last seen on 03/07/2022.  Last MMSE was 27/30 on Sep 02, 2021.    Any changes in memory since last visit??  Denies any significant changes in the  cognitive standpoint.  He continues to "do okay with conversations ".  At times, he becomes very frustrated when he is trying to express something and he has to "go the way around it to find the right word ".  He continues to complete 120 pages of crossword puzzles and reading, he does not watch much TV. repeats oneself?  Endorsed Disoriented when walking into a room?  Patient denies leaving objects in unusual places?  Patient denies   Wandering behavior?   denies   Any personality changes since last visit?  He continues to feel frustrated, when he cannot remember, and that places a strain in the relationship with his wife. Any worsening depression?: denies   Hallucinations or paranoia?  denies   Seizures?   denies    Any sleep changes?  Lately, he has been experiencing disturbing dreams about past events "it is about what I so while in [place], I left [place] during the [place] and in some dreams have relieved the experience ".  He was never diagnosed with PTSD Sleep apnea?   denies   Any hygiene concerns?   denies   Independent of bathing and dressing?  Endorsed  Does the patient needs help with medications? Patient is in charge   Who is in charge of the finances? Patient  is in charge     Any changes in appetite?watches his diet, has intentionally lost some weight to stay healthy. Patient have trouble swallowing?  denies   Does the patient cook? Sometimes  Any kitchen accidents such as leaving the stove on?   denies   Any headaches?    denies  Vision changes? denies Chronic back pain  denies   Ambulates with difficulty?  He walks about 1 hour despite his right second toe amputation in the past.  He enjoys some skeet shooting. Recent falls or head injuries?    denies     Unilateral weakness, numbness or tingling?   denies   Any tremors?  denies   Any anosmia?    denies   Any incontinence of urine?  denies   Any bowel dysfunction?  denies      Patient lives with his wife Does the  patient drive?  Continues to drive, he denies any issues.  Very seldom he uses his GPS if he is truly unfamiliar territory.    History on Initial Assessment 11/25/2019: This is a 82 year old right-handed man with a history of hypertension, hyperlipidemia, diabetes, sleep apnea on CPAP, colon cancer s/p resection, presenting for evaluation of memory loss/word-finding difficulties. He reported symptoms started 2 years ago, MMSE 25/30 in PCP office last 08/2019. He and his wife started noticing more significant changes in the past 1 and 1/2 years. He endorses word-finding difficulties, he tries to tell something and the important part slips by him. He states it is not constant, however his wife says it happens a lot. He is searching for words and thoughts, unable to remember names of things or restaurants. He is forgetting how to do things sometimes, this is not often, but one time he forgot how to put his car in park (had to push a button). He has had this car for 2 years. He repeats stories. He cannot focus when watching TV and she is speaking to him, unable to multitask. He says "huh" or "what did you say" a lot, and she does not think it is his hearing. He lives with his wife. He denies getting lost driving, no missed ills or medications. He denies misplacing things. His wife has also noticed personality changes. He has a short temper worse than before and gets very nasty. When he cannot think of a word, it makes him nervous and uptight. He gets angry and upset when criticized or when he does not agree. No paranoia or hallucinations, no prior psychiatric history. He states his mood is good. His mother had dementia. No history of significant head injuries. No alcohol use.   He had constipation which resolved with a stool softener. He denies any headaches, dizziness, diplopia, dysarthria/dysphagia, neck/back pain, focal numbness/tingling/weakness, bladder dysfunction, anosmia, or tremors. He sleeps like a baby  with his CPAP machine. 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    Neurocognitive testing 11/2020 Dr. [name] Briefly, results suggested noted performance variability surrounding executive functioning, semantic fluency, confrontation naming, and encoding (i.e., encoding) and retrieval aspects of memory. Performance was generally appropriate relative to age-matched peers across processing speed, receptive language, phonemic fluency, and visuospatial abilities. Relative to his evaluation one year prior, his most notable areas of decline surrounded executive functioning and attention/concentration. These were generally mild overall. Regarding etiology, there remain some concerns surrounding Alzheimer's disease based upon memory dysfunction, coupled with deficits in confrontation naming, semantic fluency, and executive functioning. As such, I cannot rule out this diagnosis. However, despite scores suggesting delayed memory falling in the exceptionally low normative range, he was able to demonstrate some appropriate retrieval/consolidation abilities, which would not suggest characteristic rapid forgetting. If Alzheimer's disease is indeed the underlying pathology, it appears to be progressing slowly, likely aided by current medication intervention and regular physical/mental  stimulation.   Past Medical History:  Diagnosis Date   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   S/p 2nd ray amputation 2018- started after prolonged use of corn pad   Allergic rhinitis 08/19/2019   flonase   Anemia    BPH (benign prostatic hyperplasia) 07/02/2015   S/p TURP with multiple complications afterwards including recurrent hospitalizations. All started after a hemorrhoid surgery and later with urinary retention. Patient at one point was septic from urinary issues.     Diabetes mellitus type II, controlled 04/23/2007    Metformin 1g BID, amaryl 4mg --> 8mg , had to add [place] back  Never took victoza.    Januvia-lethargic and dizzy in past. 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   History of colon cancer    Tubular adenoma of colon; s/p colectomy 1992    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   History of shingles    Hyperlipidemia associated with type 2 diabetes mellitus 04/23/2007   Atorvastatin 20mg  once weekly     Hypertension associated with diabetes 04/23/2007   Amlodipine 5mg , telmisartan 40mg      Iritis 12/17/2008   Shingles 2010    Mild neurocognitive disorder 12/23/2019   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   Obstructive sleep apnea 04/23/2007   uses CPAP nightly   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   Sepsis secondary to UTI 03/29/2015     Past Surgical History:  Procedure Laterality Date   AMPUTATION TOE Right 09/14/2016   Procedure: RIGHT 2ND TOE/RAY AMPUTATION;  Surgeon: Tarry Kos, MD;  Location: [facility];  Service: Orthopedics;  Laterality: Right;   APPENDECTOMY  05/03/1947   BIOPSY  01/28/2022   Procedure: BIOPSY;  Surgeon: Sherrilyn Rist, MD;  Location: [facility] ENDOSCOPY;  Service: Gastroenterology;;   COLECTOMY  05/02/1990   CYSTOSCOPY N/A 06/21/2015   Procedure: CYSTOSCOPY, CLOT EVACUATION, AND CAUTERIZATION OF PROSTATE FOSSA;  Surgeon: Jethro Bolus, MD;  Location: [facility];  Service: Urology;  Laterality: N/A;   ESOPHAGOGASTRODUODENOSCOPY (EGD) WITH PROPOFOL N/A 01/26/2022   Procedure: ESOPHAGOGASTRODUODENOSCOPY (EGD) WITH PROPOFOL;  Surgeon: Sherrilyn Rist, MD;  Location: [facility] ENDOSCOPY;  Service: Gastroenterology;  Laterality: N/A;   ESOPHAGOGASTRODUODENOSCOPY (EGD) WITH PROPOFOL N/A 01/28/2022   Procedure: ESOPHAGOGASTRODUODENOSCOPY (EGD) WITH PROPOFOL;  Surgeon: Sherrilyn Rist, MD;  Location: [facility] ENDOSCOPY;  Service: Gastroenterology;  Laterality: N/A;   HEMORRHOID SURGERY      HEMOSTASIS CLIP PLACEMENT  01/26/2022   Procedure: HEMOSTASIS CLIP PLACEMENT;  Surgeon: Sherrilyn Rist, MD;  Location: [facility] ENDOSCOPY;  Service: Gastroenterology;;   HEMOSTASIS CONTROL  01/26/2022   Procedure: HEMOSTASIS CONTROL;  Surgeon: Sherrilyn Rist, MD;  Location: [facility]COPY;  Service: Gastroenterology;;   HOT HEMOSTASIS N/A 01/26/2022   Procedure: HOT HEMOSTASIS (ARGON PLASMA COAGULATION/BICAP);  Surgeon: Sherrilyn Rist, MD;  Location: [facility] ENDOSCOPY;  Service: Gastroenterology;  Laterality: N/A;   MOLE REMOVAL  2022   hand   SCLEROTHERAPY  01/26/2022   Procedure: SCLEROTHERAPY;  Surgeon: Sherrilyn Rist, MD;  Location: [facility] ENDOSCOPY;  Service: Gastroenterology;;   TOE SURGERY  04/2020   TRANSURETHRAL RESECTION OF PROSTATE N/A 05/23/2015   Procedure: TRANSURETHRAL RESECTION OF THE PROSTATE (TURP);  Surgeon: Bjorn Pippin, MD;  Location: [facility];  Service: Urology;  Laterality: N/A;     PREVIOUS MEDICATIONS:  CURRENT MEDICATIONS:  Outpatient Encounter Medications as of 09/20/2022  Medication Sig   amLODipine (NORVASC) 5 MG tablet TAKE 1 TABLET BY MOUTH EVERY DAY (Patient taking differently: Take 5 mg by mouth daily.)   [employer]S STOMACH RELIEF 262 MG chewable tablet Chew 2 tablets by mouth 4 (four) times daily.   donepezil (ARICEPT) 10 MG tablet TAKE 1 TABLET BY MOUTH EVERYDAY AT BEDTIME   ferrous sulfate 325 (65 FE) MG tablet Take 1 tablet (325 mg total) by mouth 2 (two) times daily with a meal.   glipiZIDE (GLUCOTROL) 10 MG tablet TAKE 1 TABLET (10 MG TOTAL) BY MOUTH TWICE A DAY BEFORE A MEAL   Glucagon (GVOKE HYPOPEN 2-PACK) 0.5 MG/0.1ML SOAJ Inject 0.5 mg into the skin daily as needed (for low blood sugar).   Lutein 20 MG TABS Take 20 mg by mouth daily.   metFORMIN (GLUCOPHAGE) 1000 MG tablet TAKE 1 TABLET BY MOUTH 2 TIMES DAILY WITH A MEAL. (Patient taking differently: Take 1,000 mg by mouth 2 (two) times daily with a meal.)   Multiple Vitamins-Minerals (CENTRUM SILVER PO)  Take 1 tablet by mouth daily.    ONETOUCH ULTRA test strip USE ONCE DAILY AS INSTRUCTED   pantoprazole (PROTONIX) 40 MG tablet Take 1 tablet (40 mg total) by mouth 2 (two) times daily.   rosuvastatin (CRESTOR) 20 MG tablet TAKE 1 TABLET BY MOUTH ONE TIME PER WEEK   sitaGLIPtin (JANUVIA) 100 MG tablet Take 1 tablet (100 mg total) by mouth daily.   telmisartan (MICARDIS) 40 MG tablet TAKE 1 TABLET BY MOUTH EVERY DAY   VITAMIN A PO Take 1 tablet by mouth daily.   No facility-administered encounter medications on file as of 09/20/2022.     Objective:     PHYSICAL EXAMINATION:    VITALS:   Vitals:   09/20/22 1509  BP: 134/65  Pulse: 74  Resp: 18  SpO2: 99%  Height: 6' (1.829 m)    GEN:  The patient appears stated age and is in NAD. HEENT:  Normocephalic, atraumatic.   Neurological examination:  General: NAD, well-groomed, appears stated age. Orientation: The patient is alert. Oriented to person, place and not to date. Cranial nerves: There is good facial symmetry.The speech is fluent with occasional difficulty trying to find a certain words, and clear. No aphasia or dysarthria. Fund of knowledge is appropriate. Recent memory impaired and remote memory is normal.  Attention and concentration are normal.  Able to name objects and repeat phrases.  Hearing is intact to conversational tone. Delayed recall 1/3 Sensation: Sensation is intact to light touch throughout, he has prior right second toe amputation Motor: Strength is at least antigravity x4. Tremors: none  DTR's 2/4 in UE/LE       No data to display             09/20/2022    5:00 PM 09/02/2021   11:00 AM  MMSE - Mini Mental State Exam  Orientation to time 3 5  Orientation to Place 5 5  Registration 3 3  Attention/ Calculation 5 5  Recall 1 0  Language- name 2 objects 2 2  Language- repeat 1 1  Language- follow 3 step command 3 3  Language- read & follow direction 1 1  Write a sentence 1 1  Copy design 1 1   Total score 26 27       Movement examination: Tone: There is normal tone in the UE/LE Abnormal movements:  no tremor.  No myoclonus.  No asterixis.   Coordination:  There is no decremation with RAM's. Normal finger to nose  Gait and Station: The patient has no difficulty arising out of a deep-seated chair without the use of the hands. The patient's stride length is good.  Gait is cautious and narrow.   Thank you for allowing [place] the opportunity to participate in the care of this nice patient. Please do not hesitate to contact us for any questions or concerns.   Total time spent on today's visit was 25 minutes dedicated to this patient today, preparing to see patient, examining the patient, ordering tests and/or medications and counseling the patient, documenting clinical information in the EHR or other health record, independently interpreting results and communicating results to the patient/family, discussing treatment and goals, answering patient's questions and coordinating care.  Cc:  Shelva Majestic, MD  Marlowe Kays 09/20/2022 6:08 PM

## 2022-09-20 NOTE — Patient Instructions (Addendum)
Good to see you.  Continue Donepezil to 10mg  daily. 1 tablet daily Repeat MRI of the brain  2. Referral to Psychotherapy for depression and anxiety  Speech therapy referral  3 Repeat neuropsych evaluation  4 Follow-up in 6 months, call for any changes   RECOMMENDATIONS FOR ALL PATIENTS WITH MEMORY PROBLEMS: 1. Continue to exercise (Recommend 30 minutes of walking everyday, or 3 hours every week) 2. Increase social interactions - continue going to [place] and enjoy social gatherings with friends and family 3. Eat healthy, avoid fried foods and eat more fruits and vegetables 4. Maintain adequate blood pressure, blood sugar, and blood cholesterol level. Reducing the risk of stroke and cardiovascular disease also helps promoting better memory. 5. Avoid stressful situations. Live a simple life and avoid aggravations. Organize your time and prepare for the next day in anticipation. 6. Sleep well, avoid any interruptions of sleep and avoid any distractions in the bedroom that may interfere with adequate sleep quality 7. Avoid sugar, avoid sweets as there is a strong link between excessive sugar intake, diabetes, and cognitive impairment We discussed the Mediterranean diet, which has been shown to help patients reduce the risk of progressive memory disorders and reduces cardiovascular risk. This includes eating fish, eat fruits and green leafy vegetables, nuts like almonds and hazelnuts, walnuts, and also use olive oil. Avoid fast foods and fried foods as much as possible. Avoid sweets and sugar as sugar use has been linked to worsening of memory function.  There is always a concern of gradual progression of memory problems. If this is the case, then we may need to adjust level of care according to patient needs. Support, both to the patient and caregiver, should then be put into place.      Mediterranean Diet  Why follow it? Research shows. Those who follow the Mediterranean diet have a reduced risk  of heart disease  The diet is associated with a reduced incidence of Parkinson's and Alzheimer's diseases People following the diet may have longer life expectancies and lower rates of chronic diseases  The Dietary Guidelines for Americans recommends the Mediterranean diet as an eating plan to promote health and prevent disease  What Is the Mediterranean Diet?  Healthy eating plan based on typical foods and recipes of Mediterranean-style cooking The diet is primarily a plant based diet; these foods should make up a majority of meals   Starches - Plant based foods should make up a majority of meals - They are an important sources of vitamins, minerals, energy, antioxidants, and fiber - Choose whole grains, foods high in fiber and minimally processed items  - Typical grain sources include wheat, oats, barley, corn, brown rice, bulgar, farro, millet, polenta, couscous  - Various types of beans include chickpeas, lentils, fava beans, black beans, white beans   Fruits  Veggies - Large quantities of antioxidant rich fruits & veggies; 6 or more servings  - Vegetables can be eaten raw or lightly drizzled with oil and cooked  - Vegetables common to the traditional Mediterranean Diet include: artichokes, arugula, beets, broccoli, brussel sprouts, cabbage, carrots, celery, collard greens, cucumbers, eggplant, kale, leeks, lemons, lettuce, mushrooms, okra, onions, peas, peppers, potatoes, pumpkin, radishes, rutabaga, shallots, spinach, sweet potatoes, turnips, zucchini - Fruits common to the Mediterranean Diet include: apples, apricots, avocados, cherries, clementines, dates, figs, grapefruits, grapes, melons, nectarines, oranges, peaches, pears, pomegranates, strawberries, tangerines  Fats - Replace butter and margarine with healthy oils, such as olive oil, canola oil, and tahini  -  Limit nuts to no more than a handful a day  - Nuts include walnuts, almonds, pecans, pistachios, pine nuts  - Limit or avoid  candied, honey roasted or heavily salted nuts - Olives are central to the Mediterranean diet - can be eaten whole or used in a variety of dishes   Meats Protein - Limiting red meat: no more than a few times a month - When eating red meat: choose lean cuts and keep the portion to the size of deck of cards - Eggs: approx. 0 to 4 times a week  - Fish and lean poultry: at least 2 a week  - Healthy protein sources include, chicken, [place], lean beef, lamb - Increase intake of seafood such as tuna, salmon, trout, mackerel, shrimp, scallops - Avoid or limit high fat processed meats such as sausage and bacon  Dairy - Include moderate amounts of low fat dairy products  - Focus on healthy dairy such as fat free yogurt, skim milk, low or reduced fat cheese - Limit dairy products higher in fat such as whole or 2% milk, cheese, ice cream  Alcohol - Moderate amounts of red wine is ok  - No more than 5 oz daily for women (all ages) and men older than age 32  - No more than 10 oz of wine daily for men younger than 22  Other - Limit sweets and other desserts  - Use herbs and spices instead of salt to flavor foods  - Herbs and spices common to the traditional Mediterranean Diet include: basil, bay leaves, chives, cloves, cumin, fennel, garlic, lavender, marjoram, mint, oregano, parsley, pepper, rosemary, sage, savory, sumac, tarragon, thyme   It's not just a diet, it's a lifestyle:  The Mediterranean diet includes lifestyle factors typical of those in the region  Foods, drinks and meals are best eaten with others and savored Daily physical activity is important for overall good health This could be strenuous exercise like running and aerobics This could also be more leisurely activities such as walking, housework, yard-work, or taking the stairs Moderation is the key; a balanced and healthy diet accommodates most foods and drinks Consider portion sizes and frequency of consumption of certain foods   Meal  Ideas & Options:  Breakfast:  Whole wheat toast or whole wheat English muffins with peanut butter & hard boiled egg Steel cut oats topped with apples & cinnamon and skim milk  Fresh fruit: banana, strawberries, melon, berries, peaches  Smoothies: strawberries, bananas, greek yogurt, peanut butter Low fat greek yogurt with blueberries and granola  Egg white omelet with spinach and mushrooms Breakfast couscous: whole wheat couscous, apricots, skim milk, cranberries  Sandwiches:  Hummus and grilled vegetables (peppers, zucchini, squash) on whole wheat bread   Grilled chicken on whole wheat pita with lettuce, tomatoes, cucumbers or tzatziki  [place] salad on whole wheat bread: tuna salad made with greek yogurt, olives, red peppers, capers, green onions Garlic rosemary lamb pita: lamb sauted with garlic, rosemary, salt & pepper; add lettuce, cucumber, greek yogurt to pita - flavor with lemon juice and black pepper  Seafood:  Mediterranean grilled salmon, seasoned with garlic, basil, parsley, lemon juice and black pepper Shrimp, lemon, and spinach whole-grain pasta salad made with low fat greek yogurt  Seared scallops with lemon orzo  Seared tuna steaks seasoned salt, pepper, coriander topped with tomato mixture of olives, tomatoes, olive oil, minced garlic, parsley, green onions and cappers  Meats:  Herbed greek chicken salad with kalamata olives, cucumber, feta  Red bell peppers stuffed with spinach, bulgur, lean ground beef (or lentils) & topped with feta   Kebabs: skewers of chicken, tomatoes, onions, zucchini, squash  [place] burgers: made with red onions, mint, dill, lemon juice, feta cheese topped with roasted red peppers Vegetarian Cucumber salad: cucumbers, artichoke hearts, celery, red onion, feta cheese, tossed in olive oil & lemon juice  Hummus and whole grain pita points with a greek salad (lettuce, tomato, feta, olives, cucumbers, red onion) Lentil soup with celery, carrots made with  vegetable broth, garlic, salt and pepper  Tabouli salad: parsley, bulgur, mint, scallions, cucumbers, tomato, radishes, lemon juice, olive oil, salt and pepper.

## 2022-09-22 ENCOUNTER — Telehealth: Payer: Self-pay | Admitting: Physician Assistant

## 2022-09-22 NOTE — Telephone Encounter (Signed)
Pt wife is returning a call to christy

## 2022-09-23 NOTE — Telephone Encounter (Signed)
Left message to call me if she still need anything, all appts have been scheduled.

## 2022-09-28 ENCOUNTER — Ambulatory Visit: Payer: Medicare Other

## 2022-09-29 ENCOUNTER — Ambulatory Visit: Payer: Medicare Other

## 2022-09-29 ENCOUNTER — Telehealth: Payer: Self-pay | Admitting: Family Medicine

## 2022-09-29 NOTE — Telephone Encounter (Signed)
See below

## 2022-09-29 NOTE — Telephone Encounter (Signed)
The Pet scan that was ordered needs to be pre certified through [employer]. Please advise.

## 2022-10-03 ENCOUNTER — Encounter (HOSPITAL_COMMUNITY): Payer: Medicare Other

## 2022-10-04 NOTE — Telephone Encounter (Signed)
Patient's spouse called stating patient's PET scan has been rescheduled for 6/12. Caller wants confirmation that authorization or anything needed prior to completing the PET scan is done. Requests callback to confirm all is well.

## 2022-10-05 ENCOUNTER — Ambulatory Visit: Payer: Medicare Other

## 2022-10-06 ENCOUNTER — Ambulatory Visit: Payer: Medicare Other | Admitting: Family Medicine

## 2022-10-06 ENCOUNTER — Encounter: Payer: Self-pay | Admitting: Family Medicine

## 2022-10-06 VITALS — BP 132/71 | HR 67 | Temp 98.0°F | Resp 16 | Ht 72.0 in | Wt 188.6 lb

## 2022-10-06 DIAGNOSIS — E119 Type 2 diabetes mellitus without complications: Secondary | ICD-10-CM | POA: Diagnosis not present

## 2022-10-06 DIAGNOSIS — Z7984 Long term (current) use of oral hypoglycemic drugs: Secondary | ICD-10-CM

## 2022-10-06 DIAGNOSIS — I10 Essential (primary) hypertension: Secondary | ICD-10-CM | POA: Diagnosis not present

## 2022-10-06 MED ORDER — TELMISARTAN 80 MG PO TABS: 80.0000 mg | ORAL_TABLET | Freq: Every day | ORAL | 3 refills | Status: DC

## 2022-10-06 NOTE — Patient Instructions (Addendum)
Health Maintenance Due  Topic Date Due   Zoster Vaccines- Shingrix (1 of 2) Never done  Consider at pharmacy  diabetes appears to be significantly improved with his lifestyle changes and weight loss- we will recheck next visit -keep checking at home especially before visits so we can compare   Recommended follow up: Return for next already scheduled visit or sooner if needed.

## 2022-10-06 NOTE — Progress Notes (Signed)
Phone [phone] In person visit   Subjective:   Walter [name] is a 82 y.o. year old very pleasant male patient who presents for/with See problem oriented charting Chief Complaint  Patient presents with   Medical Management of Chronic Issues   Hypertension    States that Dr. Durene Cal increased Micardis 40 mg to BID, do not see that noted on current med list. Will need new script sent to pharmacy    Past Medical History-  Patient Active Problem List   Diagnosis Date Noted   Acute upper GI bleeding 01/26/2022    Priority: High   Duodenal ulcer hemorrhage     Priority: High   Mild neurocognitive disorder 12/23/2019    Priority: High   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    Priority: High   BPH (benign prostatic hyperplasia) 07/02/2015    Priority: High   Diabetes mellitus type II, controlled 04/23/2007    Priority: High   History of colon cancer 01/27/2014    Priority: Medium    Hyperlipidemia associated with type 2 diabetes mellitus 04/23/2007    Priority: Medium    Obstructive sleep apnea 04/23/2007    Priority: Medium    Essential hypertension 04/23/2007    Priority: Medium    Allergic rhinitis 08/19/2019    Priority: Low   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    Priority: Low   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    Priority: Low   Hallux valgus of right foot 03/26/2014    Priority: Low   Iritis 12/17/2008    Priority: Low   Postherpetic neuralgia 05/22/2008    Priority: Low   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     donepezil (ARICEPT) 10 MG tablet TAKE 1 TABLET BY MOUTH EVERYDAY AT BEDTIME 90 tablet 1   ferrous sulfate 325 (65 FE) MG tablet Take 1 tablet (325 mg total) by mouth 2 (two) times daily with a meal.     glipiZIDE (GLUCOTROL) 10 MG tablet TAKE 1 TABLET (10 MG TOTAL) BY MOUTH TWICE A DAY BEFORE A MEAL 180 tablet 3   Glucagon (GVOKE HYPOPEN 2-PACK) 0.5 MG/0.1ML SOAJ Inject 0.5 mg into the skin daily as needed (for low blood sugar). 0.2 mL 1   Lutein 20 MG TABS Take 20 mg by mouth daily.     metFORMIN (GLUCOPHAGE) 1000 MG tablet TAKE 1 TABLET BY MOUTH 2 TIMES DAILY WITH A MEAL. (Patient taking differently: Take 1,000 mg by mouth 2 (two) times daily with a meal.) 180 tablet 3   Multiple Vitamins-Minerals (CENTRUM SILVER PO) Take 1 tablet by mouth daily.      ONETOUCH ULTRA test strip USE ONCE DAILY AS INSTRUCTED 100 strip 3   pantoprazole (PROTONIX) 40 MG tablet Take 1 tablet (40 mg total) by mouth 2 (two) times daily. 60 tablet 2   rosuvastatin (CRESTOR) 20 MG tablet TAKE 1 TABLET BY MOUTH ONE TIME PER WEEK 13 tablet 3   sitaGLIPtin (JANUVIA) 100 MG tablet Take 1 tablet (100 mg total)  by mouth daily. 90 tablet 3   telmisartan (MICARDIS) 40 MG tablet -taking 2 right now TAKE 1 TABLET BY MOUTH EVERY DAY (Patient taking differently: Take 40 mg by mouth 2 (two) times daily. TAKE 1 TABLET BY MOUTH EVERY DAY) 90 tablet 3   VITAMIN A PO Take 1 tablet by mouth daily.     No current facility-administered medications for this visit.     Objective:  BP 132/71 Comment: most recent home reading  Pulse 67   Temp 98 F (36.7 C) (Temporal)   Resp 16   Ht 6' (1.829 m)   Wt 188 lb 9.6 oz (85.5 kg)   SpO2 99%   BMI 25.58 kg/m  Gen: NAD, resting comfortably CV: RRR no murmurs rubs or gallops Lungs: CTAB no crackles, wheeze, rhonchi Ext: no edema Skin: warm, dry     Assessment and Plan   # Diabetes-poorly controlled at times - improved last visit S: Medication:glipizide 10 mg  twice daily, januvia 100mg  daily, metformin  1000mg  twice daily  CBGs- 140-170 last visit-more recently average of 115 . No low blood sugars- has gvoke available at home Exercise and diet- down 10 lbs in 2 months- reports drastically improved his diet- back to his beans every 3rd day plus just overall improved. Walking regularly up to an hour a day Lab Results  Component Value Date   HGBA1C 7.8 (H) 08/23/2022   HGBA1C 8.3 (H) 04/21/2022   HGBA1C 7.2 (A) 11/05/2021  A/P: diabetes appears to be significantly improved with his lifestyle changes and weight loss- we will recheck next visit   #hypertension S: medication: Telmisartan 40 mg every day-increased on 08/30/2022 to 80 mg, amlodipine 5 mg every day Home readings #s: 123-132 at home systolic/70s BP Readings from Last 3 Encounters:  10/06/22 132/71  09/20/22 134/65  08/22/22 (!) 154/88  A/P: blood pressure much improved on telmisartan 80 mg- readings at home consistently 120s or low 130s- we will refill this for him and he will continue it  # upcoming PET scan- 13 mm nodule on CT angio and PET scan was recommended by radiology-also will follow up on the prior pancreatic cyst. Test was originally done in September and we have ordered PET both in 2023 and 2024 but have had some insurance/referral process issues- appears all set for the 12th -prior cough has improved- post nasal drip seemed to do better with allergy treatment  Recommended follow up: Return for next already scheduled visit or sooner if needed. Future Appointments  Date Time Provider Department Center  10/10/2022  3:30 PM Nona Dell [facility]  10/12/2022 10:00 AM [facility] PET CT 1 [facility] [place]  11/01/2022  8:45 AM Vivi Barrack, DPM [facility]  11/02/2022 11:40 AM [facility] MR 1 [facility]  11/29/2022  1:00 PM Winstead, Lawana Chambers, LMFT [facility] None  12/12/2022  8:30 AM [name], PhD [facility] None  12/12/2022  9:30 AM [facility]  NEUROPSYCH TECH [facility] None  12/20/2022 10:00 AM [name], PhD [facility] None  12/27/2022  8:00 AM Shelva Majestic, MD [facility]  03/23/2023  1:00 PM Marcos Eke, PA-C [facility] None  05/18/2023  9:45 AM [facility] ANNUAL WELLNESS VISIT 1 [facility]   Lab/Order associations:   ICD-10-CM   1. Controlled type 2 diabetes mellitus without complication, without long-term current use of insulin (HCC)  E11.9     2. Essential hypertension  I10      Meds ordered this encounter  Medications   telmisartan (MICARDIS) 80 MG tablet    Sig: Take 1 tablet (80 mg total) by mouth daily.    Dispense:  90 tablet    Refill:  3    Return precautions advised.  Tana Conch, MD

## 2022-10-10 ENCOUNTER — Other Ambulatory Visit: Payer: Self-pay

## 2022-10-10 ENCOUNTER — Telehealth: Payer: Self-pay | Admitting: Family Medicine

## 2022-10-10 ENCOUNTER — Ambulatory Visit: Payer: Medicare Other | Attending: Physician Assistant

## 2022-10-10 DIAGNOSIS — R479 Unspecified speech disturbances: Secondary | ICD-10-CM | POA: Insufficient documentation

## 2022-10-10 DIAGNOSIS — R4701 Aphasia: Secondary | ICD-10-CM | POA: Insufficient documentation

## 2022-10-10 DIAGNOSIS — R41841 Cognitive communication deficit: Secondary | ICD-10-CM | POA: Diagnosis present

## 2022-10-10 DIAGNOSIS — R911 Solitary pulmonary nodule: Secondary | ICD-10-CM

## 2022-10-10 NOTE — Telephone Encounter (Signed)
Message has been sent to [facility].

## 2022-10-10 NOTE — Telephone Encounter (Signed)
Team this is extremely high priority-this has been delayed several times-last time I supposedly did not "sign" the order but we hand signed this last week-patient was under the impression that this was already approved after most recent reschedule-please move this to the highest of your priority-let me know what I need to do to help

## 2022-10-10 NOTE — Therapy (Signed)
OUTPATIENT SPEECH LANGUAGE PATHOLOGY APHASIA EVALUATION   Patient Name: Walter [name] MRN: [id number] DOB:06/21/1940, 82 y.o., male Today's Date: 10/10/2022  PCP: Shelva Majestic., MD REFERRING PROVIDER: Marlowe Kays, PA-C  END OF SESSION:  End of Session - 10/10/22 1647     Visit Number 1    Number of Visits 13    Date for SLP Re-Evaluation 12/09/22   date is out two weeks due to pt on vacation for two weeks   SLP Start Time 1536    SLP Stop Time  1625    SLP Time Calculation (min) 49 min    Activity Tolerance Patient tolerated treatment well             Past Medical History:  Diagnosis Date   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   S/p 2nd ray amputation 2018- started after prolonged use of corn pad   Allergic rhinitis 08/19/2019   flonase   Anemia    BPH (benign prostatic hyperplasia) 07/02/2015   S/p TURP with multiple complications afterwards including recurrent hospitalizations. All started after a hemorrhoid surgery and later with urinary retention. Patient at one point was septic from urinary issues.     Diabetes mellitus type II, controlled 04/23/2007    Metformin 1g BID, amaryl 4mg --> 8mg , had to add [place] back  Never took victoza.   Januvia-lethargic and dizzy in past. 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   History of colon cancer    Tubular adenoma of colon; s/p colectomy 1992    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   History of shingles    Hyperlipidemia associated with type 2 diabetes mellitus 04/23/2007   Atorvastatin 20mg  once weekly     Hypertension associated with diabetes 04/23/2007   Amlodipine 5mg , telmisartan 40mg      Iritis 12/17/2008   Shingles 2010    Mild neurocognitive disorder 12/23/2019   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   Obstructive sleep apnea  04/23/2007   uses CPAP nightly   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   Sepsis secondary to UTI 03/29/2015   Past Surgical History:  Procedure Laterality Date   AMPUTATION TOE Right 09/14/2016   Procedure: RIGHT 2ND TOE/RAY AMPUTATION;  Surgeon: Tarry Kos, MD;  Location:  SURGERY CENTER;  Service: Orthopedics;  Laterality: Right;   APPENDECTOMY  05/03/1947   BIOPSY  01/28/2022   Procedure: BIOPSY;  Surgeon: Sherrilyn Rist, MD;  Location: [facility] ENDOSCOPY;  Service: Gastroenterology;;   COLECTOMY  05/02/1990   CYSTOSCOPY N/A 06/21/2015   Procedure: CYSTOSCOPY, CLOT EVACUATION, AND CAUTERIZATION OF PROSTATE FOSSA;  Surgeon: Jethro Bolus, MD;  Location: [facility];  Service: Urology;  Laterality: N/A;   ESOPHAGOGASTRODUODENOSCOPY (EGD) WITH PROPOFOL N/A 01/26/2022   Procedure: ESOPHAGOGASTRODUODENOSCOPY (EGD) WITH PROPOFOL;  Surgeon: Sherrilyn Rist, MD;  Location: [facility] ENDOSCOPY;  Service: Gastroenterology;  Laterality: N/A;   ESOPHAGOGASTRODUODENOSCOPY (EGD) WITH PROPOFOL N/A 01/28/2022   Procedure: ESOPHAGOGASTRODUODENOSCOPY (EGD) WITH PROPOFOL;  Surgeon: Sherrilyn Rist, MD;  Location: [facility] ENDOSCOPY;  Service: Gastroenterology;  Laterality: N/A;   HEMORRHOID SURGERY     HEMOSTASIS CLIP PLACEMENT  01/26/2022   Procedure: HEMOSTASIS CLIP PLACEMENT;  Surgeon: Sherrilyn Rist, MD;  Location: [facility] ENDOSCOPY;  Service: Gastroenterology;;   HEMOSTASIS CONTROL  01/26/2022   Procedure: HEMOSTASIS CONTROL;  Surgeon: Myrtie Neither,  Andreas Blower, MD;  Location: [facility] ENDOSCOPY;  Service: Gastroenterology;;   HOT HEMOSTASIS N/A 01/26/2022   Procedure: HOT HEMOSTASIS (ARGON PLASMA COAGULATION/BICAP);  Surgeon: Sherrilyn Rist, MD;  Location: [facility] ENDOSCOPY;  Service: Gastroenterology;  Laterality: N/A;   MOLE REMOVAL  2022   hand   SCLEROTHERAPY  01/26/2022   Procedure: SCLEROTHERAPY;  Surgeon: Sherrilyn Rist, MD;  Location: [facility] ENDOSCOPY;  Service: Gastroenterology;;   TOE SURGERY  04/2020    TRANSURETHRAL RESECTION OF PROSTATE N/A 05/23/2015   Procedure: TRANSURETHRAL RESECTION OF THE PROSTATE (TURP);  Surgeon: Bjorn Pippin, MD;  Location: [facility];  Service: Urology;  Laterality: N/A;   Patient Active Problem List   Diagnosis Date Noted   Pulmonary nodule 02/04/2022   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   Acute upper GI bleeding 01/26/2022   Duodenal ulcer hemorrhage    Mild neurocognitive disorder 12/23/2019   Allergic rhinitis 08/19/2019   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   BPH (benign prostatic hyperplasia) 07/02/2015   Hallux valgus of right foot 03/26/2014   History of colon cancer 01/27/2014   Iritis 12/17/2008   Postherpetic neuralgia 05/22/2008   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 "I would say my memory is fairly good, wouldn't you (to wife)?" Wife agreed. Pt accompanied by: significant other : [name]  PERTINENT HISTORY: 82 y.o. RH male with a history of hypertension, hyperlipidemia, diabetes, sleep apnea on CPAP, colon cancer s/p resection, prior history of H. 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   PAIN:  Are you having pain? No  FALLS: Has patient fallen in last 6 months?  No  LIVING  ENVIRONMENT: Lives with: lives with their spouse Lives in: House/apartment  PLOF:  Level of assistance: Independent with ADLs, Independent with IADLs Employment: Retired  PATIENT GOALS: Improve word finding  OBJECTIVE:   DIAGNOSTIC FINDINGS:  Pt has appointment for f/u neuropsych testing in August 2024 Neurocognitive testing 11/2020 Dr. [name] Briefly, results suggested noted performance variability surrounding executive functioning, semantic fluency, confrontation naming, and encoding (i.e., encoding) and retrieval aspects of memory. Performance was generally appropriate relative to age-matched peers across processing speed, receptive language, phonemic fluency, and visuospatial abilities. Relative to his evaluation one year prior, his most notable areas of decline surrounded executive functioning and attention/concentration. These were generally mild overall. Regarding etiology, there remain some concerns surrounding Alzheimer's disease based upon memory dysfunction, coupled with deficits in confrontation naming, semantic fluency, and executive functioning. As such, I cannot rule out this diagnosis. However, despite scores suggesting delayed memory falling in the exceptionally low normative range, he was able to demonstrate some appropriate retrieval/consolidation abilities, which would not suggest characteristic rapid forgetting. If Alzheimer's disease is indeed the underlying pathology, it appears to be progressing slowly, likely aided by current medication intervention and regular physical/mental stimulation.   Pt has  current script for MR Head -pending  MRI HEAD WITHOUT AND WITH CONTRAST 12/22/2019 COMPARISON:  None.  FINDINGS: Brain: No recent infarction, hemorrhage, hydrocephalus, extra-axial collection or mass lesion. Chronic small vessel ischemia in the cerebral white matter, mild for age. Cerebral volume loss without specific pattern, relatively mild for age. No chronic blood  products or abnormal mineralization. No abnormal enhancement. Vascular: Preserved flow voids. Tortuous basilar impressing on the pons. Skull and upper cervical spine: Normal marrow signal Sinuses/Orbits: Mucosal thickening mainly in ethmoid sinuses. IMPRESSION: 1. No reversible finding or specific explanation for symptoms. 2. Mild for age white matter disease and generalized volume loss.   COGNITION: Overall cognitive status: Impaired per neuropsych eval dated 2021 Areas of impairment:  Attention: Impaired: Comment: as per neuropscyh eval Executive function: Impaired: Comment: as per neuropsych eval  AUDITORY COMPREHENSION: Overall auditory comprehension: Appears intact  EXPRESSION: verbal  VERBAL EXPRESSION: Level of generative/spontaneous verbalization: conversation Repetition: not tested Naming: Confrontation: see [employer] - 2 results below Pragmatics: Appears intact Comments: Pt with aphasic deficits in conversation and pt unaware. "Wrath"/wreath, and "duress"/digress. Pt was aware of "yogurt"/yoke error  Effective technique: phonemic cues Non-verbal means of communication: N/A Comments: Pt used description and circumlocution strategies spontaneously for self-cueing and for compensatory measures in conversation and during BNT-2.  MOTOR SPEECH: Overall motor speech: Appears intact; questionable very mild apraxic-like movement suggestive of oral nonverbal apraxia with alternate motion tasks, and less-fluid artic movements with rapid repetition of "buttercup buttercup buttercup" Respiration: thoracic breathing Phonation: normal Resonance: WFL Intelligibility: Intelligible Motor planning: Appears intact Motor speech errors:  SLP believes at this time pt errors are aphasic and not apraxic  ORAL MOTOR EXAMINATION: Overall status: Impaired: Labial: Bilateral (Coordination) - some difficulty with alternate motion with pucker/smile which became slightly improved with  practice  STANDARDIZED ASSESSMENTS: [place] NAMING TEST-2: Raw score 34/60, which is below WNL for age group 57-92 years old. Pt is 81 and thus normative data cannot be used.  PATIENT REPORTED OUTCOME MEASURES (PROM): Communication Effectiveness Survey: to be provided in first 1-2 sessions   TODAY'S TREATMENT:                                                                                                                                         DATE:   10/10/22 (eval): SLP discussed eval results and home tasks for pt to engage in with wife's help if necessary (5 sentences out of anomic word, once discovered). SLP also discussed how to assist pt at home (phonemic cues if wife knew what word pt was attempting to say).  PATIENT EDUCATION: Education details: see "today's treatment" Person educated: Patient and Spouse Education method: Explanation, Demonstration, and Verbal cues Education comprehension: verbalized understanding, verbal cues required, and needs further education   GOALS: Goals reviewed with patient? Yes  SHORT TERM GOALS: Target date: 11/08/22  Pt will tell SLP 4 strategies for  word finding in two sessions Baseline: Goal status: INITIAL  2.  Pt will demo [facility] word finding in 10 minutes simple conversation using compensations in 3 sessions Baseline:  Goal status: INITIAL  3.  Pt will name 8 items in a simple category with rare min A in 2 sessions Baseline:  Goal status: INITIAL  4.  Pt and wife will demo modified independence with SFA and VNEST Baseline:  Goal status: INITIAL   LONG TERM GOALS: Target date: 12/09/22  Pt will demo [facility] word finding in 10 minutes simple-mod complex conversation using compensations in 3 sessions Baseline:  Goal status: INITIAL  2.  Pt will report to SLP he is using at least 2 memory strategies at home between 3 sessions, or use a memory strategy in 2 sessions  Baseline:  Goal status: INITIAL  3.  Pt will report less frustration  associated with anomia than initially prior to ST Baseline:  Goal status: INITIAL  4.  Pt will score higher when administered PROM in the last 1-2 sessions compared to initial administration Baseline:  Goal status: INITIAL  ASSESSMENT:  CLINICAL IMPRESSION: Patient is a 82 y.o. M who was seen today for anomia in light of questionable etiology. Pt has pending MR as well as f/u neuropsych eval in August to shed light on reason for his cognitive-linguistic difficulties. Today Stephaun expressed significant frustration with his anomia so SLP suggested pt stop, take a breath, and then attempt to say the word, to alleviate pt's frustration and subsequent deterioration of communication skills in the moment. Trypp repoorts that his anomia appears worse at home than when "out with the guys."  OBJECTIVE IMPAIRMENTS: include attention, executive functioning, and aphasia. These impairments are limiting patient from household responsibilities, ADLs/IADLs, and effectively communicating at home and in community. Factors affecting potential to achieve goals and functional outcome are  none toda . Patient will benefit from skilled SLP services to address above impairments and improve overall function.  REHAB POTENTIAL: Fair depends upon diagnosis; pt agrees that SLP can do some things with him to explore feasibility of direct language therapy asissting pt's expressive language.  PLAN:  SLP FREQUENCY: 2x/week  SLP DURATION: 6 weeks  PLANNED INTERVENTIONS: Language facilitation, Environmental controls, Cueing hierachy, Cognitive reorganization, Internal/external aids, Functional tasks, Multimodal communication approach, SLP instruction and feedback, Compensatory strategies, and Patient/family education    [facility], CCC-SLP 10/10/2022, 5:11 PM

## 2022-10-10 NOTE — Telephone Encounter (Signed)
Pts needs the imaging order to be PA per insurance. Please advise.

## 2022-10-11 NOTE — Telephone Encounter (Signed)
I spoke with Misty Stanley this morning and she is currently working on this and will call and update pt and wife.

## 2022-10-11 NOTE — Telephone Encounter (Signed)
Patient's wife Mervyn Gay states Patient has to reschedule PET Scan because facility does not have insurance approval-would have to pay out of pocket.  Requests to be called asap.

## 2022-10-11 NOTE — Telephone Encounter (Signed)
Thanks team

## 2022-10-11 NOTE — Telephone Encounter (Signed)
PET scan has been scheduled for tomorrow.

## 2022-10-12 ENCOUNTER — Ambulatory Visit (HOSPITAL_COMMUNITY): Payer: Medicare Other

## 2022-10-12 ENCOUNTER — Ambulatory Visit: Payer: Medicare Other

## 2022-10-17 ENCOUNTER — Ambulatory Visit: Payer: Medicare Other

## 2022-10-17 DIAGNOSIS — R4701 Aphasia: Secondary | ICD-10-CM

## 2022-10-17 DIAGNOSIS — R41841 Cognitive communication deficit: Secondary | ICD-10-CM

## 2022-10-17 NOTE — Therapy (Signed)
OUTPATIENT SPEECH LANGUAGE PATHOLOGY APHASIA TREATMENT   Patient Name: Walter [name] MRN: [id number] DOB:11/19/1940, 82 y.o., male Today's Date: 10/17/2022  PCP: Shelva Majestic., MD REFERRING PROVIDER: Marlowe Kays, PA-C  END OF SESSION:  End of Session - 10/17/22 1603     Visit Number 2    Number of Visits 13    Date for SLP Re-Evaluation 12/09/22   date is out two weeks due to pt on vacation for two weeks   SLP Start Time 1405    SLP Stop Time  1445    SLP Time Calculation (min) 40 min    Activity Tolerance Patient tolerated treatment well              Past Medical History:  Diagnosis Date   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   S/p 2nd ray amputation 2018- started after prolonged use of corn pad   Allergic rhinitis 08/19/2019   flonase   Anemia    BPH (benign prostatic hyperplasia) 07/02/2015   S/p TURP with multiple complications afterwards including recurrent hospitalizations. All started after a hemorrhoid surgery and later with urinary retention. Patient at one point was septic from urinary issues.     Diabetes mellitus type II, controlled 04/23/2007    Metformin 1g BID, amaryl 4mg --> 8mg , had to add [place] back  Never took victoza.   Januvia-lethargic and dizzy in past. 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   History of colon cancer    Tubular adenoma of colon; s/p colectomy 1992    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   History of shingles    Hyperlipidemia associated with type 2 diabetes mellitus 04/23/2007   Atorvastatin 20mg  once weekly     Hypertension associated with diabetes 04/23/2007   Amlodipine 5mg , telmisartan 40mg      Iritis 12/17/2008   Shingles 2010    Mild neurocognitive disorder 12/23/2019   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   Obstructive sleep apnea  04/23/2007   uses CPAP nightly   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   Sepsis secondary to UTI 03/29/2015   Past Surgical History:  Procedure Laterality Date   AMPUTATION TOE Right 09/14/2016   Procedure: RIGHT 2ND TOE/RAY AMPUTATION;  Surgeon: Tarry Kos, MD;  Location: [facility];  Service: Orthopedics;  Laterality: Right;   APPENDECTOMY  05/03/1947   BIOPSY  01/28/2022   Procedure: BIOPSY;  Surgeon: Sherrilyn Rist, MD;  Location: [facility] ENDOSCOPY;  Service: Gastroenterology;;   COLECTOMY  05/02/1990   CYSTOSCOPY N/A 06/21/2015   Procedure: CYSTOSCOPY, CLOT EVACUATION, AND CAUTERIZATION OF PROSTATE FOSSA;  Surgeon: Jethro Bolus, MD;  Location: [facility];  Service: Urology;  Laterality: N/A;   ESOPHAGOGASTRODUODENOSCOPY (EGD) WITH PROPOFOL N/A 01/26/2022   Procedure: ESOPHAGOGASTRODUODENOSCOPY (EGD) WITH PROPOFOL;  Surgeon: Sherrilyn Rist, MD;  Location: [facility] ENDOSCOPY;  Service: Gastroenterology;  Laterality: N/A;   ESOPHAGOGASTRODUODENOSCOPY (EGD) WITH PROPOFOL N/A 01/28/2022   Procedure: ESOPHAGOGASTRODUODENOSCOPY (EGD) WITH PROPOFOL;  Surgeon: Sherrilyn Rist, MD;  Location: [facility] ENDOSCOPY;  Service: Gastroenterology;  Laterality: N/A;   HEMORRHOID SURGERY     HEMOSTASIS CLIP PLACEMENT  01/26/2022   Procedure: HEMOSTASIS CLIP PLACEMENT;  Surgeon: Sherrilyn Rist, MD;  Location: [facility] ENDOSCOPY;  Service: Gastroenterology;;   HEMOSTASIS CONTROL  01/26/2022   Procedure: HEMOSTASIS CONTROL;  Surgeon:  Sherrilyn Rist, MD;  Location: [facility] ENDOSCOPY;  Service: Gastroenterology;;   HOT HEMOSTASIS N/A 01/26/2022   Procedure: HOT HEMOSTASIS (ARGON PLASMA COAGULATION/BICAP);  Surgeon: Sherrilyn Rist, MD;  Location: [facility] ENDOSCOPY;  Service: Gastroenterology;  Laterality: N/A;   MOLE REMOVAL  2022   hand   SCLEROTHERAPY  01/26/2022   Procedure: SCLEROTHERAPY;  Surgeon: Sherrilyn Rist, MD;  Location: [facility] ENDOSCOPY;  Service: Gastroenterology;;   TOE SURGERY  04/2020    TRANSURETHRAL RESECTION OF PROSTATE N/A 05/23/2015   Procedure: TRANSURETHRAL RESECTION OF THE PROSTATE (TURP);  Surgeon: Bjorn Pippin, MD;  Location: [facility];  Service: Urology;  Laterality: N/A;   Patient Active Problem List   Diagnosis Date Noted   Pulmonary nodule 02/04/2022   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   Acute upper GI bleeding 01/26/2022   Duodenal ulcer hemorrhage    Mild neurocognitive disorder 12/23/2019   Allergic rhinitis 08/19/2019   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   BPH (benign prostatic hyperplasia) 07/02/2015   Hallux valgus of right foot 03/26/2014   History of colon cancer 01/27/2014   Iritis 12/17/2008   Postherpetic neuralgia 05/22/2008   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 "If I can't say the name of the street - so what? I can get there if I need to." Pt accompanied by: significant other : wife-[name]  PERTINENT HISTORY: 34 y.o. RH male with a history of hypertension, hyperlipidemia, diabetes, sleep apnea on CPAP, colon cancer s/p resection, prior history of H. 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   PAIN:  Are you having pain? No  FALLS: Has patient fallen in last 6 months?   No  PATIENT GOALS: Improve word finding  OBJECTIVE:   DIAGNOSTIC FINDINGS:  Pt has appointment for f/u neuropsych testing in August 2024 Neurocognitive testing 11/2020 Dr. [name] Briefly, results suggested noted performance variability surrounding executive functioning, semantic fluency, confrontation naming, and encoding (i.e., encoding) and retrieval aspects of memory. Performance was generally appropriate relative to age-matched peers across processing speed, receptive language, phonemic fluency, and visuospatial abilities. Relative to his evaluation one year prior, his most notable areas of decline surrounded executive functioning and attention/concentration. These were generally mild overall. Regarding etiology, there remain some concerns surrounding Alzheimer's disease based upon memory dysfunction, coupled with deficits in confrontation naming, semantic fluency, and executive functioning. As such, I cannot rule out this diagnosis. However, despite scores suggesting delayed memory falling in the exceptionally low normative range, he was able to demonstrate some appropriate retrieval/consolidation abilities, which would not suggest characteristic rapid forgetting. If Alzheimer's disease is indeed the underlying pathology, it appears to be progressing slowly, likely aided by current medication intervention and regular physical/mental stimulation.   Pt has current script for MR Head -pending  MRI HEAD WITHOUT AND WITH CONTRAST 12/22/2019 COMPARISON:  None.  FINDINGS: Brain: No recent infarction, hemorrhage, hydrocephalus, extra-axial collection or mass lesion. Chronic small vessel ischemia in the cerebral white matter, mild for age. Cerebral volume loss without specific pattern, relatively mild for age. No chronic blood products or abnormal mineralization. No abnormal enhancement. Vascular: Preserved flow voids. Tortuous basilar impressing on the pons. Skull and upper cervical spine: Normal  marrow signal Sinuses/Orbits: Mucosal thickening mainly in ethmoid sinuses. IMPRESSION: 1. No reversible finding or specific explanation for symptoms. 2. Mild for age white matter disease and generalized volume loss.   PATIENT REPORTED OUTCOME MEASURES (PROM): Communication Effectiveness Survey: provided today for pt and wife to return next session   TODAY'S TREATMENT:                                                                                                                                         DATE:   10/17/22: Vernona Rieger has not been able to cue pt as much at home due to being unsure of the word pt is struggling to generate. Pt could not generate "Karin Golden" or "[place]" in explanation of where his [place]ie [place] is where he meets "the guys". Wife assisted pt with anomia correctly once, and provided the first letter of anomic word x1. He described feelings for anomia in "s", above re: [place] but expressed frustration about anomia with Karin Golden. SLP suggested pt write down 5 sentences with Karin Golden in them and write/read/listen to the sentences.  SLP provided memory strategies and will go over these with pt next session, in order to improve the flow of conversation/communication at home.   10/10/22 (eval): SLP discussed eval results and home tasks for pt to engage in with wife's help if necessary (5 sentences out of anomic word, once discovered). SLP also discussed how to assist pt at home (phonemic cues if wife knew what word pt was attempting to say).  PATIENT EDUCATION: Education details: see "today's treatment" Person educated: Patient and Spouse Education method: Explanation, Demonstration, and Verbal cues Education comprehension: verbalized understanding, verbal cues required, and needs further education   GOALS: Goals reviewed with patient? Yes  SHORT TERM GOALS: Target date: 11/08/22  Pt will tell SLP 4 strategies for word finding in two sessions Baseline: Goal  status: INITIAL  2.  Pt will demo [facility] word finding in 10 minutes simple conversation using compensations in 3 sessions Baseline:  Goal status: INITIAL  3.  Pt will name 8 items in a simple category with rare min A in 2 sessions Baseline:  Goal status: INITIAL  4.  Pt and wife will demo modified independence with SFA and VNEST Baseline:  Goal status: INITIAL   LONG TERM GOALS: Target date: 12/09/22  Pt will demo [facility] word finding in 10 minutes simple-mod complex conversation using compensations in 3 sessions Baseline:  Goal status: INITIAL  2.  Pt will report to SLP he is using at  least 2 memory strategies at home between 3 sessions, or use a memory strategy in 2 sessions  Baseline:  Goal status: INITIAL  3.  Pt will report less frustration associated with anomia than initially prior to ST Baseline:  Goal status: INITIAL  4.  Pt will score higher when administered PROM in the last 1-2 sessions compared to initial administration Baseline:  Goal status: INITIAL  ASSESSMENT:  CLINICAL IMPRESSION: Patient is a 82 y.o. M who was seen today for anomia in light of questionable etiology. Pt has pending MR as well as f/u neuropsych eval in August to shed light on reason for his cognitive-linguistic difficulties. Today Donovann expressed significant frustration with his anomia so SLP suggested pt stop, take a breath, and then attempt to say the word, to alleviate pt's frustration and subsequent deterioration of communication skills in the moment. Fin repoorts that his anomia appears worse at home than when "out with the guys."  OBJECTIVE IMPAIRMENTS: include attention, executive functioning, and aphasia. These impairments are limiting patient from household responsibilities, ADLs/IADLs, and effectively communicating at home and in community. Factors affecting potential to achieve goals and functional outcome are  none toda . Patient will benefit from skilled SLP services to address above  impairments and improve overall function.  REHAB POTENTIAL: Fair depends upon diagnosis; pt agrees that SLP can do some things with him to explore feasibility of direct language therapy asissting pt's expressive language.  PLAN:  SLP FREQUENCY: 2x/week  SLP DURATION: 6 weeks  PLANNED INTERVENTIONS: Language facilitation, Environmental controls, Cueing hierachy, Cognitive reorganization, Internal/external aids, Functional tasks, Multimodal communication approach, SLP instruction and feedback, Compensatory strategies, and Patient/family education    [facility], CCC-SLP 10/17/2022, 4:04 PM

## 2022-10-17 NOTE — Patient Instructions (Signed)

## 2022-10-19 ENCOUNTER — Ambulatory Visit: Payer: Medicare Other

## 2022-10-19 ENCOUNTER — Encounter: Payer: Self-pay | Admitting: Psychology

## 2022-10-19 ENCOUNTER — Ambulatory Visit: Payer: Medicare Other | Admitting: Psychology

## 2022-10-19 DIAGNOSIS — F0670 Mild neurocognitive disorder due to known physiological condition without behavioral disturbance: Secondary | ICD-10-CM | POA: Diagnosis not present

## 2022-10-19 DIAGNOSIS — G309 Alzheimer's disease, unspecified: Secondary | ICD-10-CM

## 2022-10-19 DIAGNOSIS — Z85828 Personal history of other malignant neoplasm of skin: Secondary | ICD-10-CM | POA: Insufficient documentation

## 2022-10-19 DIAGNOSIS — R4189 Other symptoms and signs involving cognitive functions and awareness: Secondary | ICD-10-CM

## 2022-10-19 DIAGNOSIS — Z86018 Personal history of other benign neoplasm: Secondary | ICD-10-CM | POA: Insufficient documentation

## 2022-10-19 NOTE — Progress Notes (Signed)
   Psychometrician Note   Cognitive testing was administered to Walter [name] by Shan Levans, B.S. (psychometrist) under the supervision of Dr. Newman Nickels, Ph.D., licensed psychologist on 10/19/2022. [name] did not appear overtly distressed by the testing session per behavioral observation or responses across self-report questionnaires. Rest breaks were offered.    The battery of tests administered was selected by Dr. Newman Nickels, Ph.D. with consideration to [name]'s current level of functioning, the nature of his symptoms, emotional and behavioral responses during interview, level of literacy, observed level of motivation/effort, and the nature of the referral question. This battery was communicated to the psychometrist. Communication between Dr. Newman Nickels, Ph.D. and the psychometrist was ongoing throughout the evaluation and Dr. Newman Nickels, Ph.D. was immediately accessible at all times. Dr. Newman Nickels, Ph.D. provided supervision to the psychometrist on the date of this service to the extent necessary to assure the quality of all services provided.    Walter [name] will return within approximately 1-2 weeks for an interactive feedback session with Dr. [name] at which time his test performances, clinical impressions, and treatment recommendations will be reviewed in detail. Walter [name] understands he can contact our office should he require our assistance before this time.  A total of 135 minutes of billable time were spent face-to-face with Walter [name] by the psychometrist. This includes both test administration and scoring time. Billing for these services is reflected in the clinical report generated by Dr. Newman Nickels, Ph.D.  This note reflects time spent with the psychometrician and does not include test scores or any clinical interpretations made by Dr. [name]. The full report will follow in a separate note.

## 2022-10-19 NOTE — Progress Notes (Unsigned)
NEUROPSYCHOLOGICAL EVALUATION [place]. [facility] Department of Neurology  Date of Evaluation: October 19, 2022  Reason for Referral:   Walter [name] is a 82 y.o. 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   Assessment and Plan:   Clinical Impression(s): Walter [name] pattern of performance is suggestive of prominent impairment surrounding executive functioning, semantic fluency, confrontation naming, and all aspects of learning and memory. Performance variability was further noted across processing speed and phonemic fluency. Performances were appropriate relative to age-matched peers across attention/concentration, safety/judgment, receptive language, and visuospatial abilities. Walter [name] denied difficulties completing instrumental activities of daily living (ADLs) independently. His wife was in agreement with this. 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  Relative to his previous evaluation in August 2022, progressive decline was exhibited across executive functioning, both semantic and phonemic fluency, confrontation naming, and essentially all aspects of learning and memory. This appeared most pronounced surrounding aspects of expressive language. Other assessed cognitive domains exhibited relatively stability. No improvement was consistently observed.   Regarding etiology, I continue to have concern surrounding the presence of underlying Alzheimer's disease. This is based not only on the overall pattern of current deficits, but also given that progressive decline  involving the exact areas exhibited by Mr. Boening across the current evaluation follows the expected pattern of progression in this illness. Decline appears relatively mild which could suggest the continued slowed progression of this illness. Expressive language decline does appear more mild to moderate in speed. A vascular contribution continues to be plausible given various medical ailments and prior neuroimaging suggesting microvascular ischemic disease. However, these factors would not account for the extent and pattern of dysfunction in isolation. Continued medical monitoring will be important moving forward.   Recommendations: A repeat neuropsychological evaluation in 12-24 months (sooner if functional decline is noted) is recommended to assess the trajectory of future cognitive decline should it occur. This will also aid in future efforts towards improved diagnostic clarity.  Walter [name] has already been prescribed a medication aimed to address memory loss and concerns surrounding Alzheimer's disease (i.e., donepezil/Aricept). He is encouraged to continue taking this medication as prescribed. It is important to highlight that this medication has been shown to slow functional decline in some individuals. There is no current treatment which can stop or reverse cognitive decline when caused by a neurodegenerative illness.   Performance across neurocognitive testing is not a strong predictor of an individual's safety operating a motor vehicle. Should his family wish to pursue a formalized driving evaluation, they could reach out to the following agencies: [employer] in [place]: [phone] [facility]: [phone] [facility]: [phone] [facility]: [phone] or [phone]  Should there be progression of current deficits over time, Walter [name] is unlikely to regain any independent living skills lost. Therefore, it is recommended that he remain  as involved as possible in all aspects of household chores, finances, and medication management, with supervision to ensure adequate performance. He will likely benefit from the establishment and maintenance of a routine in order to maximize his functional abilities over time.  It will be important for Walter [name] to have another person with him  when in situations where he may need to process information, weigh the pros and cons of different options, and make decisions, in order to ensure that he fully understands and recalls all information to be considered.  If not already done, Walter [name] and his family may want to discuss his wishes regarding durable power of attorney and medical decision making, so that he can have input into these choices. If they require legal assistance with this, long-term care resource access, or other aspects of estate planning, they could reach out to [employer] at [phone] for a free consultation. Additionally, they may wish to discuss future plans for caretaking and seek out community options for in home/residential care should they become necessary.  Walter [name] is encouraged to attend to lifestyle factors for brain health (e.g., regular physical exercise, good nutrition habits and consideration of the MIND-DASH diet, regular participation in cognitively-stimulating activities, and general stress management techniques), which are likely to have benefits for both emotional adjustment and cognition. Optimal control of vascular risk factors (including safe cardiovascular exercise and adherence to dietary recommendations) is encouraged. Likewise, continued compliance with his CPAP machine will also be important. Continued participation in activities which provide mental stimulation and social interaction is also recommended.   Important information should be provided to Walter [name] in written format in all instances. This information should be placed in a highly frequented and  easily visible location within his home to promote recall. External strategies such as written notes in a consistently used memory journal, visual and nonverbal auditory cues such as a calendar on the refrigerator or appointments with alarm, such as on a cell phone, can also help maximize recall.  Memory can be improved using internal strategies such as rehearsal, repetition, chunking, mnemonics, association, and imagery. External strategies such as written notes in a consistently used memory journal, visual and nonverbal auditory cues such as a calendar on the refrigerator or appointments with alarm, such as on a cell phone, can also help maximize recall.    When learning new information, he would benefit from information being broken up into small, manageable pieces. he may also find it helpful to articulate the material in his own words and in a context to promote encoding at the onset of a new task. This material may need to be repeated multiple times to promote encoding.  To address problems with processing speed, he may wish to consider:   -Ensuring that he is alerted when essential material or instructions are being presented   -Adjusting the speed at which new information is presented   -Allowing for more time in comprehending, processing, and responding in conversation   -Repeating and paraphrasing instructions or conversations aloud  To address problems with fluctuating attention and/or executive dysfunction, he may wish to consider:   -Avoiding external distractions when needing to concentrate   -Limiting exposure to fast paced environments with multiple sensory demands   -Writing down complicated information and using checklists   -Attempting and completing one task at a time (i.e., no multi-tasking)   -Verbalizing aloud each step of a task to maintain focus   -Taking frequent breaks during the completion of steps/tasks to avoid fatigue   -Reducing the amount of information considered  at one time   -Scheduling more difficult activities for a time of day where he is usually most alert  Review of Records:   Walter [name] was seen by [facility] Marland KitchenPatrcia Dolly, M.D.) on 11/25/2019 for an evaluation of memory loss.  Briefly, Walter [name] reported symptoms starting approximately two years ago. However, he and his wife started noticing more significant changes over the past 1.5 years. He primarily reported word-finding difficulties where he tries to tell someone something and the important part slips by him. He described this as happening occasionally; however, his wife says it happens a lot. His wife stated that he will often search for words and thoughts and is sometimes unable to remember names of things or restaurants. He also seems to forget how to do familiar things (e.g., one time he forgot how to put his car in park). He was also noted to repeat stories, has trouble multi-tasking, and cannot focus when watching TV. He denied getting lost driving and ADLs were described as intact. His wife reported some personality changes in that when he cannot think of a word, he becomes nervous and uptight. He denied any headaches, dizziness, diplopia, dysarthria/dysphagia, neck/back pain, focal numbness/tingling/weakness, bladder dysfunction, anosmia, tremors, hallucinations, or psychiatric distress. He has a history of sleep apnea and uses his CPAP machine nightly. Performance on a brief cognitive screening instrument (MMSE) was 25/30 with his PCP in April 2021. Performance on the SLUMS was 19/30 with Dr. Karel [name].   He completed a comprehensive neuropsychological evaluation with myself on 12/23/2019. Results at that time suggested an impairment across confrontation naming, as well as additional weaknesses across phonemic fluency and receptive language. Performance variability was further exhibited across executive functioning and learning and memory. Performance was appropriate across domains of processing  speed, attention/concentration, semantic fluency, and visuospatial abilities. Walter [name] denied difficulties completing instrumental activities of daily living (ADLs) independently and was diagnosed with a mild neurocognitive disorder ("mild cognitive impairment"). The etiology of weaknesses was said to be unclear. Alzheimer's disease could not be ruled out given verbal memory dysfunction and impairments in confrontation naming. However, other performances remained strong. Repeat testing in 12-18 months was recommended.    He followed up with Dr. Karel [name] on 06/29/2020. He has been taking donepezil 5mg  daily without side effects. He reported that his memory is "working" and that some days he feels very sharp. Overall, he reported feeling that he has been doing a lot better. Memory dysfunction was primarily observed when he gets less sleep. His wife also reported feeling pleased and that he has been doing much better. ADLs were described as intact. He has not been driving since his foot surgery this past December but denied difficulties prior to this. He utilizes his CPAP machine nightly and denied headaches, dizziness, focal numbness/tingling, or recent falls. No mood-related concerns were reported. Ultimately, Walter [name] was referred for a comprehensive neuropsychological evaluation to characterize his cognitive abilities and to assist with diagnostic clarity and treatment planning.   He completed a repeat evaluation with myself on 12/22/2020. Relative to his evaluation one year prior, his most notable areas of decline surrounded executive functioning and attention/concentration. These were generally mild overall. There was also perhaps a more subtle decline across visual memory, semantic fluency, and confrontation naming; however, these latter declines are harder to detect. Verbal memory continues to represent an ongoing weakness. However, despite different instrumentation, I do not believe that significant decline  has been captured since his previous evaluation across testing. Subtle improvements were seen across phonemic fluency and receptive language.   He was most recently seen by [facility] Marlowe Kays, PA-C) on 09/20/2022 for regular follow-up. He has been tolerating donepezil well and cognition was described as stable overall. No significant ADL dysfunction was noted.  He did describe an exacerbation of psychiatric distress lately, especially surrounding potential PTSD symptoms surrounding war-time experiences while living in [place] in his youth. Performance on a brief cognitive screening instrument (MMSE) was 26/30. Ultimately, Walter [name] was referred for a comprehensive neuropsychological evaluation to characterize his cognitive abilities and to assist with diagnostic clarity and treatment planning.    Brain MRI on 12/21/2019 revealed mild for age white matter disease and generalized volume loss. He is currently scheduled to complete an updated brain MRI on 11/02/2022.   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 All started after a hemorrhoid surgery and later with urinary retention. Patient at one point was septic from urinary issues.     Diabetes mellitus type II, controlled 04/23/2007    Metformin 1g BID, amaryl 4mg --> 8mg , had to add [place] back  Never took victoza.   Januvia-lethargic and dizzy in past. 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   History of colon cancer    Tubular adenoma of colon; s/p colectomy 1992    History of dysplastic nevus    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   History of shingles    History of skin cancer    Hyperlipidemia associated with type 2 diabetes mellitus 04/23/2007   Atorvastatin 20mg  once weekly     Iritis 12/17/2008   Shingles 2010    Mild neurocognitive disorder 12/23/2019   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   Obstructive sleep apnea 04/23/2007   uses CPAP nightly   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   Pulmonary nodule 02/04/2022   Sepsis secondary to UTI 03/29/2015    Past Surgical History:  Procedure Laterality Date   AMPUTATION TOE Right 09/14/2016   Procedure: RIGHT 2ND TOE/RAY AMPUTATION;  Surgeon: Tarry Kos, MD;  Location: [facility];  Service: Orthopedics;  Laterality: Right;   APPENDECTOMY  05/03/1947   BIOPSY  01/28/2022   Procedure: BIOPSY;  Surgeon: Sherrilyn Rist, MD;  Location: [facility] ENDOSCOPY;  Service: Gastroenterology;;   COLECTOMY  05/02/1990   CYSTOSCOPY N/A 06/21/2015   Procedure: CYSTOSCOPY, CLOT EVACUATION, AND CAUTERIZATION OF PROSTATE FOSSA;  Surgeon: Jethro Bolus, MD;  Location: [facility];  Service: Urology;  Laterality: N/A;   ESOPHAGOGASTRODUODENOSCOPY (EGD) WITH PROPOFOL N/A 01/26/2022   Procedure: ESOPHAGOGASTRODUODENOSCOPY (EGD) WITH PROPOFOL;  Surgeon: Sherrilyn Rist, MD;  Location: [facility] ENDOSCOPY;  Service: Gastroenterology;  Laterality: N/A;   ESOPHAGOGASTRODUODENOSCOPY (EGD) WITH PROPOFOL N/A 01/28/2022   Procedure: ESOPHAGOGASTRODUODENOSCOPY (EGD) WITH PROPOFOL;  Surgeon: Sherrilyn Rist, MD;  Location: [facility] ENDOSCOPY;  Service: Gastroenterology;  Laterality: N/A;   HEMORRHOID SURGERY     HEMOSTASIS CLIP PLACEMENT  01/26/2022   Procedure: HEMOSTASIS CLIP PLACEMENT;  Surgeon: Sherrilyn Rist, MD;  Location: [facility] ENDOSCOPY;  Service: Gastroenterology;;    HEMOSTASIS CONTROL  01/26/2022   Procedure: HEMOSTASIS CONTROL;  Surgeon: Amada Jupiter  L III, MD;  Location: [facility] ENDOSCOPY;  Service: Gastroenterology;;   HOT HEMOSTASIS N/A 01/26/2022   Procedure: HOT HEMOSTASIS (ARGON PLASMA COAGULATION/BICAP);  Surgeon: Sherrilyn Rist, MD;  Location[facility] ENDOSCOPY;  Service: Gastroenterology;  Laterality: N/A;   MOLE REMOVAL  2022   hand   SCLEROTHERAPY  01/26/2022   Procedure: SCLEROTHERAPY;  Surgeon: Sherrilyn Rist, MD;  Location: [facility] ENDOSCOPY;  Service: Gastroenterology;;   TOE SURGERY  04/2020   TRANSURETHRAL RESECTION OF PROSTATE N/A 05/23/2015   Procedure: TRANSURETHRAL RESECTION OF THE PROSTATE (TURP);  Surgeon: Bjorn Pippin, MD;  Location: [facility];  Service: Urology;  Laterality: N/A;    Current Outpatient Medications:    amLODipine (NORVASC) 5 MG tablet, TAKE 1 TABLET BY MOUTH EVERY DAY (Patient taking differently: Take 5 mg by mouth daily.), Disp: 90 tablet, Rfl: 3   [employer] STOMACH RELIEF 262 MG chewable tablet, Chew 2 tablets by mouth 4 (four) times daily., Disp: , Rfl:    donepezil (ARICEPT) 10 MG tablet, TAKE 1 TABLET BY MOUTH EVERYDAY AT BEDTIME, Disp: 90 tablet, Rfl: 1   ferrous sulfate 325 (65 FE) MG tablet, Take 1 tablet (325 mg total) by mouth 2 (two) times daily with a meal. (Patient not taking: Reported on 10/10/2022), Disp: , Rfl:    glipiZIDE (GLUCOTROL) 10 MG tablet, TAKE 1 TABLET (10 MG TOTAL) BY MOUTH TWICE A DAY BEFORE A MEAL, Disp: 180 tablet, Rfl: 3   Glucagon (GVOKE HYPOPEN 2-PACK) 0.5 MG/0.1ML SOAJ, Inject 0.5 mg into the skin daily as needed (for low blood sugar)., Disp: 0.2 mL, Rfl: 1   Lutein 20 MG TABS, Take 20 mg by mouth daily., Disp: , Rfl:    metFORMIN (GLUCOPHAGE) 1000 MG tablet, TAKE 1 TABLET BY MOUTH 2 TIMES DAILY WITH A MEAL. (Patient taking differently: Take 1,000 mg by mouth 2 (two) times daily with a meal.), Disp: 180 tablet, Rfl: 3   Multiple Vitamins-Minerals (CENTRUM SILVER PO), Take 1 tablet by mouth daily. ,  Disp: , Rfl:    ONETOUCH ULTRA test strip, USE ONCE DAILY AS INSTRUCTED, Disp: 100 strip, Rfl: 3   pantoprazole (PROTONIX) 40 MG tablet, Take 1 tablet (40 mg total) by mouth 2 (two) times daily., Disp: 60 tablet, Rfl: 2   rosuvastatin (CRESTOR) 20 MG tablet, TAKE 1 TABLET BY MOUTH ONE TIME PER WEEK, Disp: 13 tablet, Rfl: 3   sitaGLIPtin (JANUVIA) 100 MG tablet, Take 1 tablet (100 mg total) by mouth daily., Disp: 90 tablet, Rfl: 3   telmisartan (MICARDIS) 80 MG tablet, Take 1 tablet (80 mg total) by mouth daily., Disp: 90 tablet, Rfl: 3   VITAMIN A PO, Take 1 tablet by mouth daily., Disp: , Rfl:   Clinical Interview:   The following information was obtained during a clinical interview with Mr. Vanduyne and his wife prior to cognitive testing.  Cognitive Symptoms: Decreased short-term memory: Endorsed. Previously, memory difficulties were attributed to word finding concerns which were said to be present occasionally and not all the time. Mr. Uphoff denied more traditional short-term memory difficulties (e.g., forgetting details of previous conversations or misplacing things around his home). Currently, Mr. Degenhardt and his wife acknowledged some increased difficulty with word finding and articulation in general relative to his August 2022 evaluation. He has started working with a speech therapist in response to this. Outside of this area, neither reported prominent concerns surrounding progressive memory decline. Decreased long-term memory: Denied. Decreased attention/concentration: Denied. Reduced processing speed: Denied. Difficulties with executive functions:  Largely denied. He previously acknowledged some impulsivity in that he has a tendency to want things that can be completed done right away. This was said to be longstanding in nature and has persisted to present day. He denied difficulties with organization, decision making, or judgment. His wife did describe some greater difficulties with  organization lately. She also added that Mr. Justman may have a shorter fuse at times when he gets frustrated or is presented with an overly challenging problem.  Difficulties with emotion regulation: Denied outside what is mentioned above.  Difficulties with receptive language: Denied. Difficulties with word finding: Endorsed (see above).   Decreased visuoperceptual ability: Denied.   Difficulties completing ADLs: Denied.  Additional Medical History: History of traumatic brain injury/concussion: Denied. History of stroke: Denied. History of seizure activity: Denied. History of known exposure to toxins: Denied. Symptoms of chronic pain: Denied. Experience of frequent headaches/migraines: Denied. Frequent instances of dizziness/vertigo: Denied.   Sensory changes: Denied.  Balance/coordination difficulties: Denied. Other motor difficulties: Denied.  Sleep History: Estimated hours obtained each night: 6-7 hours.  Difficulties falling asleep: Occasionally. He reported some trouble with insomnia due to having an overactive mind and some anxiety which can prevent him from falling asleep quickly.  Difficulties staying asleep: Denied.  Feels rested and refreshed upon awakening: Endorsed "for the most part."    History of snoring: Endorsed. History of waking up gasping for air: Endorsed. Witnessed breath cessation while asleep: Endorsed. He has a prior diagnosis of obstructive sleep apnea and utilizes his CPAP machine nightly.    History of vivid dreaming: Endorsed. He described a fairly recent exacerbation of psychiatric distress and subsequent nightmares surrounding prior traumatic experiences. Medical records suggest potential trauma stemming from fleeing [place] during the [place] blitzkrieg during World War II.  Excessive movement while asleep: Endorsed.  Instances of acting out his dreams: Endorsed. Physical movement and acting out dream content is associated with nightmares and other  vivid dreaming behaviors. His wife noted that this has seemed fairly diminished and less of an issue lately.   Psychiatric/Behavioral Health History: Depression: Denied. His current mood was positive and he denied to his knowledge any prior mental health concerns or diagnoses. Current or remote suicidal ideation, intent, or plan was also denied.  Anxiety: Denied. Mania: Denied. Trauma History: Unclear (see above). To his knowledge, he has never been formally diagnosed with PTSD in the past.  Visual/auditory hallucinations: Denied. Delusional thoughts: Denied.   Tobacco: Denied. Alcohol: He denied current alcohol consumption as well as a history of problematic alcohol abuse or dependence.  Recreational drugs: Denied.  Family History: Problem Relation Age of Onset   Alzheimer's disease Mother    Colon cancer Father 68   Diabetes Father        type I   Colon cancer Paternal Grandmother 92   Rectal cancer Maternal Uncle    Cancer Other        colon/fhx   Esophageal cancer Neg Hx    Stomach cancer Neg Hx    This information was confirmed by Walter [name].  Academic/Vocational History: Highest level of educational attainment: 18 years. Walter [name] was born in [place] and spent time in [place] prior to coming to the [place]. While he completed the equivalent of high school overseas, he had to repeat the final two years of high school when coming to the [place] due to him "not knowing who Ardelia Mems was." His first language was [place]. However, he speaks [place] fluently and considers it his  primary language. 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 He described himself as a strong (A/B) student in academic settings. English was described as a relative weakness earlier in academic settings.  History of developmental delay: Denied. History of grade repetition: Denied. Enrollment in special education courses: Denied. History of LD/ADHD: Denied.   Employment: Retired. 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   Evaluation Results:   Behavioral Observations: Walter [name] was accompanied by his wife, arrived to his appointment on time, and was appropriately dressed and groomed. He appeared alert and oriented. Observed gait and station were within normal limits. Gross motor functioning appeared intact upon informal observation and no abnormal movements (e.g., tremors) were noted. His affect was generally relaxed and positive. Spontaneous speech was fluent and word finding difficulties were not observed during the clinical interview. Thought processes were coherent, organized, and normal in content. Insight into his cognitive difficulties appeared somewhat limited. He likely does not have a full appreciation for the extent of impairment and its progression over the past several years.   During testing, sustained attention was appropriate. Word finding difficulties were apparent during testing more so than his interview. Task engagement was adequate and he persisted when challenged. Overall, Mr. Bigos was cooperative with the clinical interview and subsequent testing procedures.   Adequacy of Effort: The validity of neuropsychological testing is limited by the extent to which the individual being tested may be assumed to have exerted adequate effort during testing. Mr. Kobus expressed his intention to perform to the best of his abilities and exhibited adequate task engagement and persistence. Scores across stand-alone and embedded performance validity measures were within expectation. As such, the results of the current evaluation are believed to be a valid representation of Mr. Pergola's current cognitive functioning.  Test Results: Mr. Ritts was very mildly disoriented at the time of the current evaluation. He was unable to provide his phone number. He was also unable to state the current  date.  Intellectual abilities based upon educational and vocational attainment were estimated to be in the average to above average range. Premorbid abilities were estimated to be within the average range based upon a single-word reading test.   Processing speed was variable, ranging from the well below average to average normative ranges. Basic attention was average. More complex attention (e.g., working memory) was below average to average. Executive functioning was exceptionally low to well below average. He performed in the below average range across a task assessing safety and judgment.  Assessed receptive language abilities were below average. Mr. Hosseini did not exhibit any difficulties comprehending task instructions and answered all questions asked of him appropriately. Assessed expressive language (e.g., verbal fluency and confrontation naming) was mildly variable. Phonemic fluency was well below average to below average while both semantic fluency and confrontation naming was exceptionally low.     Assessed visuospatial/visuoconstructional abilities were below average to average.    Learning (i.e., encoding) of novel verbal information was exceptionally low to well below average. Spontaneous delayed recall (i.e., retrieval) of previously learned information was exceptionally low to below average. Retention rates were 50% (raw score of 1) across a story learning task, 0% across a list learning task, and 33% across a figure drawing task. Performance across recognition tasks was below expectation, suggesting limited evidence for information consolidation.   Results of emotional screening instruments suggested that recent symptoms of generalized anxiety  were in the minimal range, while symptoms of depression were within normal limits. A screening instrument assessing recent sleep quality suggested the presence of minimal sleep dysfunction.  Tables of Scores:   Note: This summary of test scores  accompanies the interpretive report and should not be considered in isolation without reference to the appropriate sections in the text. Descriptors are based on appropriate normative data and may be adjusted based on clinical judgment. Terms such as "Within Normal Limits" and "Outside Normal Limits" are used when a more specific description of the test score cannot be determined. Descriptors refer to the current evaluation only.          Percentile - Normative Descriptor > 98 - Exceptionally High 91-97 - Well Above Average 75-90 - Above Average 25-74 - Average 9-24 - Below Average 2-8 - Well Below Average < 2 - Exceptionally Low         Orientation: August 2021 August 2022 Current     Raw Score Raw Score Raw Score Percentile   NAB Orientation, Form 1 28/29 28/29 26/29  --- ---         Cognitive Screening:        Raw Score Raw Score Raw Score Percentile   SLUMS: 22/30 20/30 17/30  --- ---         RBANS, Form A: Standard Score/ Scaled Score Standard Score/ Scaled Score Standard Score/ Scaled Score Percentile   Total Score --- 75 64 1 Exceptionally Low  Immediate Memory --- 78 61 <1 Exceptionally Low    List Learning --- 4 4 2  Well Below Average    Story Memory --- 8 3 1  Exceptionally Low  Visuospatial/Constructional --- 75 78 7 Well Below Average    Figure Copy --- 6 6 9  Below Average    Line Orientation --- 12/20 13/20 10-16 Below Average  Language --- 83 54 <1 Exceptionally Low    Picture Naming --- 9/10 7/10 <2 Exceptionally Low    Semantic Fluency --- 3 1 <1 Exceptionally Low  Attention --- 106 109 73 Average    Digit Span --- 12 14 91 Well Above Average    Coding --- 10 9 37 Average  Delayed Memory --- 60 56 <1 Exceptionally Low    List Recall --- 1/10 0/10 <2 Exceptionally Low    List Recognition --- 13/20 14/20 <2 Exceptionally Low    Story Recall --- 6 3 1  Exceptionally Low    Story Recognition --- 8/12 6/12 5-6 Well Below Average    Figure Recall --- 6 6 9  Below Average     Figure Recognition --- 3/8 3/8 6-20 Well Below Average to Below Average          Intellectual Functioning:        Standard Score Standard Score Standard Score Percentile   Test of Premorbid Functioning: 88 94 97 42 Average         Attention/Executive Function:       Trail Making Test (TMT): Raw Score Raw Score Raw Score (T Score) Percentile     Part A 48 secs.,  0 errors 48 secs.,  1 error 62 secs.,  1 error (36) 8 Well Below Average    Part B 122 secs.,  4 errors 162 secs.,  3 errors 241 secs.,  6 errors (32) 4 Well Below Average           Scaled Score Scaled Score Scaled Score Percentile   WAIS-IV Digit Span: --- --- 8 25 Average  Forward --- --- 8 25 Average    Backward --- --- 7 16 Below Average    Sequencing --- --- 9 37 Average          Scaled Score Scaled Score Scaled Score Percentile   WAIS-IV Similarities: --- --- 2 <1 Exceptionally Low         D-KEFS Color-Word Interference Test: Raw Score Raw Score (Scaled Score) Raw Score (Scaled Score) Percentile     Color Naming 44 secs. 45 secs. (6) 50 secs. (4) 2 Well Below Average    Word Reading 23 secs. 25 secs. (11) 27 secs. (10) 50 Average    Inhibition 90 secs. 106 secs. (8) 175 secs. (1) <1 Exceptionally Low      Total Errors 1 error 4 errors (9) 9 errors (6) 9 Below Average    Inhibition/Switching 96 secs. 110 secs. (9) 42 secs. (17) 99 Exceptionally High      Total Errors 5 errors 16 errors (1) 25 errors (1) <1 Exceptionally Low         D-KEFS Verbal Fluency Test: Raw Score (Scaled Score) Raw Score (Scaled Score) Raw Score (Scaled Score) Percentile     Letter Total Correct --- --- 22 (7) 16 Below Average    Category Total Correct --- --- 11 (2) <1 Exceptionally Low    Category Switching Total Correct --- --- 4 (2) <1 Exceptionally Low    Category Switching Accuracy --- --- 2 (3) 1 Exceptionally Low      Total Set Loss Errors --- --- 5 (6) 9 Below Average      Total Repetition Errors --- --- 2 (11) 63  Average         NAB Executive Functions Module, Form 1: T Score T Score T Score Percentile     Judgment --- 50 41 18 Below Average         Language:       Verbal Fluency Test: Raw Score Raw Score (T Score) Raw Score (T Score) Percentile     Phonemic Fluency (FAS) 28 31 (40) 22 (33) 5 Well Below Average    Animal Fluency 21 15 (43) 5 (14) <1 Exceptionally Low          NAB Language Module, Form 1: T Score T Score T Score Percentile     Auditory Comprehension 29 39 39 14 Below Average    Naming 25/31 (27) 22/31 (24) 16/31 (19) <1 Exceptionally Low         Visuospatial/Visuoconstruction:        Raw Score Raw Score Raw Score Percentile   Clock Drawing: 7/10 7/10 9/10 --- Within Normal Limits          Scaled Score Scaled Score Scaled Score Percentile   WAIS-IV Block Design: 10 10 10  50 Average         Mood and Personality:        Raw Score Raw Score Raw Score Percentile   Geriatric Depression Scale: 2 0 2 --- Within Normal Limits  Geriatric Anxiety Scale: 5 5 5  --- Minimal    Somatic 3 3 2  --- Minimal    Cognitive 0 0 0 --- Minimal    Affective 2 2 3  --- Minimal         Additional Questionnaires:        Raw Score Raw Score Raw Score Percentile   PROMIS Sleep Disturbance Questionnaire: 21 21 16  --- None to Slight   Informed Consent and Coding/Compliance:   The current evaluation  represents a clinical evaluation for the purposes previously outlined by the referral source and is in no way reflective of a forensic evaluation.   Mr. Boersma was provided with a verbal description of the nature and purpose of the present neuropsychological evaluation. Also reviewed were the foreseeable risks and/or discomforts and benefits of the procedure, limits of confidentiality, and mandatory reporting requirements of this provider. The patient was given the opportunity to ask questions and receive answers about the evaluation. Oral consent to participate was provided by the patient.   This evaluation  was conducted by Newman Nickels, Ph.D., ABPP-CN, board certified clinical neuropsychologist. Mr. Shoots completed a clinical interview with Dr. [name], billed as one unit [phone], and 135 minutes of cognitive testing and scoring, billed as one unit [phone] and four additional units [id number]. Psychometrist Shan Levans, B.S., assisted Dr. [name] with test administration and scoring procedures. As a separate and discrete service, one unit [id number] and two units [phone] were billed for Dr. Tammy Sours time spent in interpretation and report writing.

## 2022-10-20 ENCOUNTER — Encounter: Payer: Self-pay | Admitting: Psychology

## 2022-10-20 ENCOUNTER — Ambulatory Visit: Payer: Medicare Other

## 2022-10-20 DIAGNOSIS — R4701 Aphasia: Secondary | ICD-10-CM

## 2022-10-20 DIAGNOSIS — R41841 Cognitive communication deficit: Secondary | ICD-10-CM

## 2022-10-20 NOTE — Therapy (Signed)
OUTPATIENT SPEECH LANGUAGE PATHOLOGY APHASIA TREATMENT   Patient Name: Walter [name] MRN: [id number] DOB:1940-10-16, 82 y.o., male Today's Date: 10/20/2022  PCP: Shelva Majestic., MD REFERRING PROVIDER: Marlowe Kays, PA-C  END OF SESSION:  End of Session - 10/20/22 1534     Visit Number 3    Number of Visits 13    Date for SLP Re-Evaluation 12/09/22   date is out two weeks due to pt on vacation for two weeks   SLP Start Time 1533    SLP Stop Time  1615    SLP Time Calculation (min) 42 min    Activity Tolerance Patient tolerated treatment well              Past Medical History:  Diagnosis Date   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   S/p 2nd ray amputation 2018- started after prolonged use of corn pad   Acute upper GI bleeding 01/26/2022   Allergic rhinitis 08/19/2019   flonase   Anemia    BPH (benign prostatic hyperplasia) 07/02/2015   S/p TURP with multiple complications afterwards including recurrent hospitalizations. All started after a hemorrhoid surgery and later with urinary retention. Patient at one point was septic from urinary issues.     Diabetes mellitus type II, controlled 04/23/2007    Metformin 1g BID, amaryl 4mg --> 8mg , had to add [place] back  Never took victoza.   Januvia-lethargic and dizzy in past. 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 Follows with Dr. 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   Pulmonary nodule 02/04/2022   Sepsis secondary to UTI 03/29/2015   Past Surgical History:  Procedure Laterality Date   AMPUTATION TOE Right 09/14/2016   Procedure: RIGHT 2ND TOE/RAY AMPUTATION;  Surgeon: Tarry Kos, MD;  Location: [facility];  Service: Orthopedics;  Laterality: Right;   APPENDECTOMY  05/03/1947   BIOPSY  01/28/2022   Procedure: BIOPSY;  Surgeon: Sherrilyn Rist, MD;  Location: [facility];  Service: Gastroenterology;;   COLECTOMY  05/02/1990   CYSTOSCOPY N/A 06/21/2015   Procedure: CYSTOSCOPY, CLOT EVACUATION, AND CAUTERIZATION OF PROSTATE FOSSA;  Surgeon: Jethro Bolus, MD;  Location: [facility];  Service: Urology;  Laterality: N/A;   ESOPHAGOGASTRODUODENOSCOPY (EGD) WITH PROPOFOL N/A 01/26/2022   Procedure: ESOPHAGOGASTRODUODENOSCOPY (EGD) WITH PROPOFOL;  Surgeon: Sherrilyn Rist, MD;  Location: [facility] END[facility];  Service: Gastroenterology;  Laterality: N/A;   ESOPHAGOGASTRODUODENOSCOPY (EGD) WITH PROPOFOL N/A 01/28/2022   Procedure: ESOPHAGOGASTRODUODENOSCOPY (EGD) WITH PROPOFOL;  Surgeon: Sherrilyn Rist, MD;  Location: [facility];  Service:  Gastroenterology;  Laterality: N/A;   HEMORRHOID SURGERY     HEMOSTASIS CLIP PLACEMENT  01/26/2022   Procedure: HEMOSTASIS CLIP PLACEMENT;  Surgeon: Sherrilyn Rist, MD;  Location: [facility];  Service: Gastroenterology;;   HEMOSTASIS CONTROL  01/26/2022   Procedure: HEMOSTASIS CONTROL;  Surgeon: Sherrilyn Rist, MD;  Location: [facility] END[facility]COPY;  Service: Gastroenterology;;   HOT HEMOSTASIS N/A 01/26/2022   Procedure: HOT HEMOSTASIS (ARGON PLASMA COAGULATION/BICAP);  Surgeon: Sherrilyn Rist, MD;  Location: [facility] ENDOSCOPY;  Service: Gastroenterology;  Laterality: N/A;   MOLE REMOVAL  2022   hand   SCLEROTHERAPY  01/26/2022   Procedure: SCLEROTHERAPY;  Surgeon: Sherrilyn Rist, MD;  Location: [facility] ENDOSCOPY;  Service: Gastroenterology;;   TOE SURGERY  04/2020   TRANSURETHRAL RESECTION OF PROSTATE N/A 05/23/2015   Procedure: TRANSURETHRAL RESECTION OF THE PROSTATE (TURP);  Surgeon: Bjorn Pippin, MD;  Location: [facility];  Service: Urology;  Laterality: N/A;   Patient Active Problem List   Diagnosis Date Noted   History of dysplastic nevus    History of skin cancer    Pulmonary nodule 02/04/2022   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   Duodenal ulcer hemorrhage    Mild neurocognitive disorder, concerns for Alzheimer's disease 12/23/2019   Allergic rhinitis 08/19/2019   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   BPH (benign prostatic hyperplasia) 07/02/2015   Hallux valgus of right foot 03/26/2014   History of colon cancer 01/27/2014   Iritis 12/17/2008   Postherpetic neuralgia 05/22/2008   Bigeminy 04/21/2008   Diabetes mellitus type II, controlled 04/23/2007   Hyperlipidemia associated with type 2 diabetes mellitus 04/23/2007   Obstructive sleep apnea 04/23/2007   Essential hypertension 04/23/2007    ONSET DATE: Chronic/gradual onset; Script 09/20/22   REFERRING DIAG:  R47.9 (ICD-10-CM) - Speech disturbance, unspecified type      THERAPY DIAG:  Aphasia  Cognitive communication deficit  Rationale for Evaluation and Treatment: Rehabilitation  SUBJECTIVE:   SUBJECTIVE STATEMENT: "I spent two hours with [name] yesterday." Pt accompanied by: significant other : wife-[name]  PERTINENT HISTORY: 82 y.o. RH male with a history of hypertension, hyperlipidemia, diabetes, sleep apnea on CPAP, colon cancer s/p resection, prior history of H. pylori,  and a history of mild cognitive  impairment, with etiology still concerning for Alzheimer's disease with vascular contribution.   PAIN:  Are you having pain? No  FALLS: Has patient fallen in last 6 months?  No  PATIENT GOALS: Improve word finding  OBJECTIVE:   DIAGNOSTIC FINDINGS:  Neurocognitive testing 11/2020 Dr. [name] Briefly, results suggested noted performance variability surrounding executive functioning, semantic fluency, confrontation naming, and encoding (i.e., encoding) and retrieval aspects of memory. Performance was generally appropriate relative to age-matched peers across processing speed, receptive language, phonemic fluency, and visuospatial abilities. Relative to his evaluation one year prior, his most notable areas of decline surrounded executive functioning and attention/concentration. These were generally mild overall. Regarding etiology, there remain some concerns surrounding Alzheimer's disease based upon memory dysfunction, coupled with deficits in confrontation naming, semantic fluency, and executive functioning. As such, I cannot rule out this diagnosis. However, despite scores suggesting delayed memory falling in the exceptionally low normative range, he was able to demonstrate some appropriate retrieval/consolidation abilities, which would not suggest characteristic rapid forgetting. If Alzheimer's disease is indeed the underlying pathology, it appears to be progressing slowly, likely aided by current medication intervention and regular physical/mental stimulation.  Pt has current script for MR Head -pending  MRI HEAD WITHOUT AND WITH CONTRAST 12/22/2019 COMPARISON:  None.  FINDINGS: Brain: No recent infarction, hemorrhage, hydrocephalus, extra-axial collection or mass lesion. Chronic small vessel ischemia in the cerebral white matter, mild for age. Cerebral volume loss without specific pattern, relatively mild for age. No chronic blood products or abnormal mineralization. No abnormal  enhancement. Vascular: Preserved flow voids. Tortuous basilar impressing on the pons. Skull and upper cervical spine: Normal marrow signal Sinuses/Orbits: Mucosal thickening mainly in ethmoid sinuses. IMPRESSION: 1. No reversible finding or specific explanation for symptoms. 2. Mild for age white matter disease and generalized volume loss.   PATIENT REPORTED OUTCOME MEASURES (PROM): Communication Effectiveness Survey: Pt answered this PROM with 31/32 (higher scores indicate more effective communication, and wife scored pt's communication effectiveness 24/32. SLP suspects pt's cognitive deficits may play a role in his reduced awareness of deficit or a reduced insight into his severity of deficit.    TODAY'S TREATMENT:                                                                                                                                         DATE:   10/20/22: Pt went to Dr. [name] for a call-list appointment yesterday for neuropsych testing. Today pt handed back homework (similarity/difference) with initial following of directions but after completion of 1/4 of the stimuli pt began to only indicate differences. When questioned about this pt stated the instructions were to put differences and said "I don't think I read the instructions." SLP reminded pt he began task correctly and pt corrected himself. "I don't know why I did that." SLP reviewed this paper with pt and by the third stimuli, pt had reverted to only telling differences, and SLP had to cue pt occasionally faded to rarely to also tell similarity for each set of words.  Pt told SLP when he was d/c'd he had many more meds and he worked out a schedule for them, with wife's help. SLP questions amount of wife's assistance for this task, given how pt is performing in ST with alternating attention/memory.   10/17/22: Vernona Rieger has not been able to cue pt as much at home due to being unsure of the word pt is struggling to generate. Pt could  not generate "Karin Golden" or "[place]" in explanation of where his [place]ie [place] is where he meets "the guys". Wife assisted pt with anomia correctly once, and provided the first letter of anomic word x1. He described feelings for anomia in "s", above re: [place] but expressed frustration about anomia with Karin Golden. SLP suggested pt write down 5 sentences with Karin Golden in them and write/read/listen to the sentences.  SLP provided memory strategies and will go over these with pt next session, in order to improve the flow of conversation/communication at home.   10/10/22 (eval): SLP discussed eval results and home tasks  for pt to engage in with wife's help if necessary (5 sentences out of anomic word, once discovered). SLP also discussed how to assist pt at home (phonemic cues if wife knew what word pt was attempting to say).  PATIENT EDUCATION: Education details: see "today's treatment" Person educated: Patient and Spouse Education method: Explanation, Demonstration, and Verbal cues Education comprehension: verbalized understanding, verbal cues required, and needs further education   GOALS: Goals reviewed with patient? Yes  SHORT TERM GOALS: Target date: 11/08/22  Pt will tell SLP 4 strategies for word finding in two sessions Baseline: Goal status: INITIAL  2.  Pt will demo [facility] word finding in 10 minutes simple conversation using compensations in 3 sessions Baseline:  Goal status: INITIAL  3.  Pt will name 8 items in a simple category with rare min A in 2 sessions Baseline:  Goal status: INITIAL  4.  Pt and wife will demo modified independence with SFA and VNEST Baseline:  Goal status: INITIAL   LONG TERM GOALS: Target date: 12/09/22  Pt will demo [facility] word finding in 10 minutes simple-mod complex conversation using compensations in 3 sessions Baseline:  Goal status: INITIAL  2.  Pt will report to SLP he is using at least 2 memory strategies at home between 3 sessions, or use  a memory strategy in 2 sessions  Baseline:  Goal status: INITIAL  3.  Pt will report less frustration associated with anomia than initially prior to ST Baseline:  Goal status: INITIAL  4.  Pt will score higher when administered PROM in the last 1-2 sessions compared to initial administration Baseline:  Goal status: INITIAL  ASSESSMENT:  CLINICAL IMPRESSION: Patient is a 82 y.o. M who was seen today for anomia in light of questionable etiology. Pt had neuropsych eval yesterday to shed light on reason for his cognitive-linguistic difficulties, with results provided on 10/27/22. Doctor repoorts that his anomia appears worse at home than when "out with the guys." SLP to add cognitive goals PRN following results from neuropsych testing shared with pt/wife on 10/27/22.  OBJECTIVE IMPAIRMENTS: include attention, executive functioning, and aphasia. These impairments are limiting patient from household responsibilities, ADLs/IADLs, and effectively communicating at home and in community. Factors affecting potential to achieve goals and functional outcome are  none toda . Patient will benefit from skilled SLP services to address above impairments and improve overall function.  REHAB POTENTIAL: Fair depends upon diagnosis; pt agrees that SLP can do some things with him to explore feasibility of direct language therapy asissting pt's expressive language.  PLAN:  SLP FREQUENCY: 2x/week  SLP DURATION: 6 weeks  PLANNED INTERVENTIONS: Language facilitation, Environmental controls, Cueing hierachy, Cognitive reorganization, Internal/external aids, Functional tasks, Multimodal communication approach, SLP instruction and feedback, Compensatory strategies, and Patient/family education    [facility], CCC-SLP 10/20/2022, 3:35 PM

## 2022-10-24 ENCOUNTER — Ambulatory Visit: Payer: Medicare Other | Admitting: Speech Pathology

## 2022-10-24 DIAGNOSIS — R41841 Cognitive communication deficit: Secondary | ICD-10-CM

## 2022-10-24 DIAGNOSIS — R4701 Aphasia: Secondary | ICD-10-CM | POA: Diagnosis not present

## 2022-10-24 NOTE — Therapy (Signed)
OUTPATIENT SPEECH LANGUAGE PATHOLOGY APHASIA TREATMENT   Patient Name: Walter [name] MRN: [id number] DOB:04/22/1941, 82 y.o., male Today's Date: 10/24/2022  PCP: Shelva Majestic., MD REFERRING PROVIDER: Marlowe Kays, PA-C  END OF SESSION:  End of Session - 10/24/22 1532     Visit Number 4    Number of Visits 13    Date for SLP Re-Evaluation 12/09/22   date is out two weeks due to pt on vacation for two weeks   SLP Start Time 1400    SLP Stop Time  1445    SLP Time Calculation (min) 45 min    Activity Tolerance Patient tolerated treatment well               Past Medical History:  Diagnosis Date   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   S/p 2nd ray amputation 2018- started after prolonged use of corn pad   Acute upper GI bleeding 01/26/2022   Allergic rhinitis 08/19/2019   flonase   Anemia    BPH (benign prostatic hyperplasia) 07/02/2015   S/p TURP with multiple complications afterwards including recurrent hospitalizations. All started after a hemorrhoid surgery and later with urinary retention. Patient at one point was septic from urinary issues.     Diabetes mellitus type II, controlled 04/23/2007    Metformin 1g BID, amaryl 4mg --> 8mg , had to add [place] back  Never took victoza.   Januvia-lethargic and dizzy in past. 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 Follows with Dr. 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   Pulmonary nodule 02/04/2022   Sepsis secondary to UTI 03/29/2015   Past Surgical History:  Procedure Laterality Date   AMPUTATION TOE Right 09/14/2016   Procedure: RIGHT 2ND TOE/RAY AMPUTATION;  Surgeon: Tarry Kos, MD;  Location: [facility];  Service: Orthopedics;  Laterality: Right;   APPENDECTOMY  05/03/1947   BIOPSY  01/28/2022   Procedure: BIOPSY;  Surgeon: Sherrilyn Rist, MD;  Location: [facility] ENDOSCOPY;  Service: Gastroenterology;;   COLECTOMY  05/02/1990   CYSTOSCOPY N/A 06/21/2015   Procedure: CYSTOSCOPY, CLOT EVACUATION, AND CAUTERIZATION OF PROSTATE FOSSA;  Surgeon: Jethro Bolus, MD;  Location: [facility];  Service: Urology;  Laterality: N/A;   ESOPHAGOGASTRODUODENOSCOPY (EGD) WITH PROPOFOL N/A 01/26/2022   Procedure: ESOPHAGOGASTRODUODENOSCOPY (EGD) WITH PROPOFOL;  Surgeon: Sherrilyn Rist, MD;  Location: [facility] ENDOSCOPY;  Service: Gastroenterology;  Laterality: N/A;   ESOPHAGOGASTRODUODENOSCOPY (EGD) WITH PROPOFOL N/A 01/28/2022   Procedure: ESOPHAGOGASTRODUODENOSCOPY (EGD) WITH PROPOFOL;  Surgeon: Sherrilyn Rist, MD;  Location: [facility] ENDOSCOPY;  Service: Gastroenterology;  Laterality: N/A;   HEMORRHOID SURGERY     HEMOSTASIS CLIP PLACEMENT  01/26/2022   Procedure: HEMOSTASIS CLIP PLACEMENT;  Surgeon: Sherrilyn Rist, MD;  Location: [facility] ENDOSCOPY;  Service: Gastroenterology;;   HEMOSTASIS CONTROL  01/26/2022   Procedure: HEMOSTASIS CONTROL;  Surgeon: Sherrilyn Rist, MD;  Location: [facility] ENDOSCOPY;  Service: Gastroenterology;;   HOT HEMOSTASIS N/A 01/26/2022   Procedure: HOT HEMOSTASIS (ARGON PLASMA COAGULATION/BICAP);  Surgeon: Sherrilyn Rist, MD;  Location: [facility] ENDOSCOPY;  Service: Gastroenterology;  Laterality: N/A;   MOLE REMOVAL  2022   hand   SCLEROTHERAPY  01/26/2022   Procedure: SCLEROTHERAPY;  Surgeon: Sherrilyn Rist, MD;  Location: [facility] END[facility]COPY;  Service: Gastroenterology;;   TOE SURGERY  04/2020   TRANSURETHRAL RESECTION OF PROSTATE N/A 05/23/2015   Procedure: TRANSURETHRAL RESECTION OF THE PROSTATE (TURP);  Surgeon: Bjorn Pippin, MD;  Location: [facility];  Service: Urology;  Laterality: N/A;   Patient Active Problem List   Diagnosis Date Noted   History of dysplastic nevus    History of skin cancer    Pulmonary nodule 02/04/2022   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   Duodenal ulcer hemorrhage    Mild neurocognitive disorder, concerns for Alzheimer's disease 12/23/2019   Allergic rhinitis 08/19/2019   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   BPH (benign prostatic hyperplasia) 07/02/2015   Hallux valgus of right foot 03/26/2014   History of colon cancer 01/27/2014   Iritis 12/17/2008   Postherpetic neuralgia 05/22/2008   Bigeminy 04/21/2008   Diabetes mellitus type II, controlled 04/23/2007   Hyperlipidemia associated with type 2 diabetes mellitus 04/23/2007   Obstructive sleep apnea 04/23/2007   Essential hypertension 04/23/2007    ONSET DATE: Chronic/gradual onset; Script 09/20/22   REFERRING DIAG:  R47.9 (ICD-10-CM) - Speech disturbance, unspecified type      THERAPY DIAG:  Aphasia  Cognitive communication deficit  Rationale for Evaluation and Treatment: Rehabilitation  SUBJECTIVE:   SUBJECTIVE STATEMENT: Pt frustrated with HEP, does not understand how is supposed to help him.  Pt accompanied by: significant other : wife-[name]  PERTINENT HISTORY: 52 y.o. RH male with a history of hypertension, hyperlipidemia, diabetes, sleep apnea on CPAP, colon cancer s/p resection, prior history of H. pylori,   and a history of mild cognitive impairment, with etiology still concerning for Alzheimer's disease with vascular contribution.   PAIN:  Are you having pain? No  FALLS: Has patient fallen in last 6 months?  No  PATIENT GOALS: Improve word finding  OBJECTIVE:   DIAGNOSTIC FINDINGS:  Neurocognitive testing 11/2020 Dr. [name] Briefly, results suggested noted performance variability surrounding executive functioning, semantic fluency, confrontation naming, and encoding (i.e., encoding) and retrieval aspects of memory. Performance was generally appropriate relative to age-matched peers across processing speed, receptive language, phonemic fluency, and visuospatial abilities. Relative to his evaluation one year prior, his most notable areas of decline surrounded executive functioning and attention/concentration. These were generally mild overall. Regarding etiology, there remain some concerns surrounding Alzheimer's disease based upon memory dysfunction, coupled with deficits in confrontation naming, semantic fluency, and executive functioning. As such, I cannot rule out this diagnosis. However, despite scores suggesting delayed memory falling in the exceptionally low normative range, he was able to demonstrate some appropriate retrieval/consolidation abilities, which would not suggest characteristic rapid forgetting. If Alzheimer's disease is indeed the underlying pathology, it appears to be progressing slowly, likely aided  by current medication intervention and regular physical/mental stimulation.   Pt has current script for MR Head -pending  MRI HEAD WITHOUT AND WITH CONTRAST 12/22/2019 COMPARISON:  None.  FINDINGS: Brain: No recent infarction, hemorrhage, hydrocephalus, extra-axial collection or mass lesion. Chronic small vessel ischemia in the cerebral white matter, mild for age. Cerebral volume loss without specific pattern, relatively mild for age. No chronic blood products or abnormal  mineralization. No abnormal enhancement. Vascular: Preserved flow voids. Tortuous basilar impressing on the pons. Skull and upper cervical spine: Normal marrow signal Sinuses/Orbits: Mucosal thickening mainly in ethmoid sinuses. IMPRESSION: 1. No reversible finding or specific explanation for symptoms. 2. Mild for age white matter disease and generalized volume loss.   PATIENT REPORTED OUTCOME MEASURES (PROM): Communication Effectiveness Survey: Pt answered this PROM with 31/32 (higher scores indicate more effective communication, and wife scored pt's communication effectiveness 24/32. SLP suspects pt's cognitive deficits may play a role in his reduced awareness of deficit or a reduced insight into his severity of deficit.    TODAY'S TREATMENT:                                                                                                                                         DATE:   10/24/22: Target use of description as anomia strategy with use of Semantic Feature Analysis as framework. SLP provides education regarding rationale and purpose, models with simple target "apple." Following, pt able to generate 5/6 features of x5 targets with usual mod-A cues from SLP. Pt benefiting from questioning, semantic, and phonemic cues to aid in completion of activity.   10/20/22: Pt went to Dr. [name] for a call-list appointment yesterday for neuropsych testing. Today pt handed back homework (similarity/difference) with initial following of directions but after completion of 1/4 of the stimuli pt began to only indicate differences. When questioned about this pt stated the instructions were to put differences and said "I don't think I read the instructions." SLP reminded pt he began task correctly and pt corrected himself. "I don't know why I did that." SLP reviewed this paper with pt and by the third stimuli, pt had reverted to only telling differences, and SLP had to cue pt occasionally faded to rarely to  also tell similarity for each set of words.  Pt told SLP when he was d/c'd he had many more meds and he worked out a schedule for them, with wife's help. SLP questions amount of wife's assistance for this task, given how pt is performing in ST with alternating attention/memory.   10/17/22: Vernona Rieger has not been able to cue pt as much at home due to being unsure of the word pt is struggling to generate. Pt could not generate "Karin Golden" or "[place]" in explanation of where his [place]ie [place] is where he meets "the guys". Wife assisted pt with anomia correctly once, and provided the first letter of anomic word x1.  He described feelings for anomia in "s", above re: [place] but expressed frustration about anomia with Karin Golden. SLP suggested pt write down 5 sentences with Karin Golden in them and write/read/listen to the sentences.  SLP provided memory strategies and will go over these with pt next session, in order to improve the flow of conversation/communication at home.   10/10/22 (eval): SLP discussed eval results and home tasks for pt to engage in with wife's help if necessary (5 sentences out of anomic word, once discovered). SLP also discussed how to assist pt at home (phonemic cues if wife knew what word pt was attempting to say).  PATIENT EDUCATION: Education details: see "today's treatment" Person educated: Patient and Spouse Education method: Explanation, Demonstration, and Verbal cues Education comprehension: verbalized understanding, verbal cues required, and needs further education   GOALS: Goals reviewed with patient? Yes  SHORT TERM GOALS: Target date: 11/08/22  Pt will tell SLP 4 strategies for word finding in two sessions Baseline: Goal status: INITIAL  2.  Pt will demo [facility] word finding in 10 minutes simple conversation using compensations in 3 sessions Baseline:  Goal status: INITIAL  3.  Pt will name 8 items in a simple category with rare min A in 2 sessions Baseline:  Goal  status: INITIAL  4.  Pt and wife will demo modified independence with SFA and VNEST Baseline:  Goal status: INITIAL   LONG TERM GOALS: Target date: 12/09/22  Pt will demo [facility] word finding in 10 minutes simple-mod complex conversation using compensations in 3 sessions Baseline:  Goal status: INITIAL  2.  Pt will report to SLP he is using at least 2 memory strategies at home between 3 sessions, or use a memory strategy in 2 sessions  Baseline:  Goal status: INITIAL  3.  Pt will report less frustration associated with anomia than initially prior to ST Baseline:  Goal status: INITIAL  4.  Pt will score higher when administered PROM in the last 1-2 sessions compared to initial administration Baseline:  Goal status: INITIAL  ASSESSMENT:  CLINICAL IMPRESSION: Patient is a 82 y.o. M who was seen today for anomia in light of questionable etiology. Pt had neuropsych eval yesterday to shed light on reason for his cognitive-linguistic difficulties, with results provided on 10/27/22. Onix repoorts that his anomia appears worse at home than when "out with the guys." SLP to add cognitive goals PRN following results from neuropsych testing shared with pt/wife on 10/27/22.  OBJECTIVE IMPAIRMENTS: include attention, executive functioning, and aphasia. These impairments are limiting patient from household responsibilities, ADLs/IADLs, and effectively communicating at home and in community. Factors affecting potential to achieve goals and functional outcome are  none toda . Patient will benefit from skilled SLP services to address above impairments and improve overall function.  REHAB POTENTIAL: Fair depends upon diagnosis; pt agrees that SLP can do some things with him to explore feasibility of direct language therapy asissting pt's expressive language.  PLAN:  SLP FREQUENCY: 2x/week  SLP DURATION: 6 weeks  PLANNED INTERVENTIONS: Language facilitation, Environmental controls, Cueing hierachy,  Cognitive reorganization, Internal/external aids, Functional tasks, Multimodal communication approach, SLP instruction and feedback, Compensatory strategies, and Patient/family education    Maia Breslow, CCC-SLP 10/24/2022, 3:32 PM

## 2022-10-25 NOTE — Telephone Encounter (Signed)
Pt would like a call back with regards to the PET scan PA. Please advise.

## 2022-10-26 ENCOUNTER — Encounter: Payer: Self-pay | Admitting: Speech Pathology

## 2022-10-26 ENCOUNTER — Ambulatory Visit: Payer: Medicare Other | Admitting: Speech Pathology

## 2022-10-26 ENCOUNTER — Other Ambulatory Visit: Payer: Self-pay

## 2022-10-26 DIAGNOSIS — R4701 Aphasia: Secondary | ICD-10-CM | POA: Diagnosis not present

## 2022-10-26 DIAGNOSIS — R41841 Cognitive communication deficit: Secondary | ICD-10-CM

## 2022-10-26 NOTE — Addendum Note (Signed)
Addended by: Shelva Majestic on: 10/26/2022 02:44 PM   Modules accepted: Orders

## 2022-10-26 NOTE — Telephone Encounter (Signed)
It was requested that I enter new orer- I still cannot see CPT code on this - but referral team member Dedra Skeens can see order (we confirmed on teams). Really appreciate Hasna setting up our communication. She will teams [place] once approved so hopefully patient can keep appointment

## 2022-10-26 NOTE — Telephone Encounter (Signed)
I sent this to [facility] 2 weeks ago and thought she had addressed this with the patient. Can you guys look into this please?

## 2022-10-27 ENCOUNTER — Ambulatory Visit: Payer: Medicare Other | Admitting: Psychology

## 2022-10-27 DIAGNOSIS — G309 Alzheimer's disease, unspecified: Secondary | ICD-10-CM

## 2022-10-27 DIAGNOSIS — F0670 Mild neurocognitive disorder due to known physiological condition without behavioral disturbance: Secondary | ICD-10-CM

## 2022-10-27 NOTE — Progress Notes (Signed)
My understanding is that this is approved now- can you touch base with patient or his wife to confirm that they are hearing good to go with insurance on their end?

## 2022-10-27 NOTE — Progress Notes (Signed)
   Neuropsychology Feedback Session [name]. [facility] [place] Department of Neurology  Reason for Referral:   Walter [name] is a 82 y.o. 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   Feedback:   Walter [name] completed a comprehensive neuropsychological evaluation on 10/19/2022. Please refer to that encounter for the full report and recommendations. Briefly, results suggested prominent impairment surrounding executive functioning, semantic fluency, confrontation naming, and all aspects of learning and memory. Performance variability was further noted across processing speed and phonemic fluency. Relative to his previous evaluation in August 2022, progressive decline was exhibited across executive functioning, both semantic and phonemic fluency, confrontation naming, and essentially all aspects of learning and memory. This appeared most pronounced surrounding aspects of expressive language. Regarding etiology, I continue to have concern surrounding the presence of underlying Alzheimer's disease. This is based not only on the overall pattern of current deficits, but also given that progressive decline involving the exact areas exhibited by Walter [name] across the current evaluation follows the expected pattern of progression in this illness. Decline appears relatively mild which could suggest the continued slowed progression of this illness. Expressive language decline does appear more mild to moderate in speed.   Walter [name] was accompanied by his wife during the current feedback session. Content of the current session focused on the results of his neuropsychological evaluation. Walter [name] was given the opportunity to ask questions and his questions  were answered. He was encouraged to reach out should additional questions arise. A copy of his report was provided at the conclusion of the visit.      One unit [phone] was billed for Dr. Tammy Sours time spent preparing for, conducting, and documenting the current feedback session with Walter [name].

## 2022-10-27 NOTE — Progress Notes (Signed)
Pt is scheduled for tomorrow, Misty Stanley has spoken with them.

## 2022-10-27 NOTE — Therapy (Signed)
OUTPATIENT SPEECH LANGUAGE PATHOLOGY APHASIA TREATMENT   Patient Name: Walter [name] MRN: [id number] DOB:02-25-1941, 82 y.o., male Today's Date: 10/24/2022  PCP: Shelva Majestic., MD REFERRING PROVIDER: Marlowe Kays, PA-C  END OF SESSION:  End of Session - 10/24/22 1532     Visit Number 4    Number of Visits 13    Date for SLP Re-Evaluation 12/09/22   date is out two weeks due to pt on vacation for two weeks   SLP Start Time 1400    SLP Stop Time  1445    SLP Time Calculation (min) 45 min    Activity Tolerance Patient tolerated treatment well               Past Medical History:  Diagnosis Date   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   S/p 2nd ray amputation 2018- started after prolonged use of corn pad   Acute upper GI bleeding 01/26/2022   Allergic rhinitis 08/19/2019   flonase   Anemia    BPH (benign prostatic hyperplasia) 07/02/2015   S/p TURP with multiple complications afterwards including recurrent hospitalizations. All started after a hemorrhoid surgery and later with urinary retention. Patient at one point was septic from urinary issues.     Diabetes mellitus type II, controlled 04/23/2007    Metformin 1g BID, amaryl 4mg --> 8mg , had to add [place] back  Never took victoza.   Januvia-lethargic and dizzy in past. 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 Follows with Dr. 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   Pulmonary nodule 02/04/2022   Sepsis secondary to UTI 03/29/2015   Past Surgical History:  Procedure Laterality Date   AMPUTATION TOE Right 09/14/2016   Procedure: RIGHT 2ND TOE/RAY AMPUTATION;  Surgeon: Tarry Kos, MD;  Location: [facility];  Service: Orthopedics;  Laterality: Right;   APPENDECTOMY  05/03/1947   BIOPSY  01/28/2022   Procedure: BIOPSY;  Surgeon: Sherrilyn Rist, MD;  Location: [facility] ENDOSCOPY;  Service: Gastroenterology;;   COLECTOMY  05/02/1990   CYSTOSCOPY N/A 06/21/2015   Procedure: CYSTOSCOPY, CLOT EVACUATION, AND CAUTERIZATION OF PROSTATE FOSSA;  Surgeon: Jethro Bolus, MD;  Location: [facility];  Service: Urology;  Laterality: N/A;   ESOPHAGOGASTRODUODENOSCOPY (EGD) WITH PROPOFOL N/A 01/26/2022   Procedure: ESOPHAGOGASTRODUODENOSCOPY (EGD) WITH PROPOFOL;  Surgeon: Sherrilyn Rist, MD;  Location: [facility] ENDOSCOPY;  Service: Gastroenterology;  Laterality: N/A;   ESOPHAGOGASTRODUODENOSCOPY (EGD) WITH PROPOFOL N/A 01/28/2022   Procedure: ESOPHAGOGASTRODUODENOSCOPY (EGD) WITH PROPOFOL;  Surgeon: Sherrilyn Rist, MD;  Location: [facility] ENDOSCOPY;  Service: Gastroenterology;  Laterality: N/A;   HEMORRHOID SURGERY     HEMOSTASIS CLIP PLACEMENT  01/26/2022   Procedure: HEMOSTASIS CLIP PLACEMENT;  Surgeon: Sherrilyn Rist, MD;  Location: [facility] ENDOSCOPY;  Service: Gastroenterology;;   HEMOSTASIS CONTROL  01/26/2022   Procedure: HEMOSTASIS CONTROL;  Surgeon: Sherrilyn Rist, MD;  Location: [facility] ENDOSCOPY;  Service: Gastroenterology;;   HOT HEMOSTASIS N/A 01/26/2022   Procedure: HOT HEMOSTASIS (ARGON PLASMA COAGULATION/BICAP);  Surgeon: Sherrilyn Rist, MD;  Location: [facility] ENDOSCOPY;  Service: Gastroenterology;  Laterality: N/A;   MOLE REMOVAL  2022   hand   SCLEROTHERAPY  01/26/2022   Procedure: SCLEROTHERAPY;  Surgeon: Sherrilyn Rist, MD;  Location: [facility] ENDOSCOPY;  Service: Gastroenterology;;   TOE SURGERY  04/2020   TRANSURETHRAL RESECTION OF PROSTATE N/A 05/23/2015   Procedure: TRANSURETHRAL RESECTION OF THE PROSTATE (TURP);  Surgeon: Bjorn Pippin, MD;  Location: [facility];  Service: Urology;  Laterality: N/A;   Patient Active Problem List   Diagnosis Date Noted   History of dysplastic nevus    History of skin cancer    Pulmonary nodule 02/04/2022   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   Duodenal ulcer hemorrhage    Mild neurocognitive disorder, concerns for Alzheimer's disease 12/23/2019   Allergic rhinitis 08/19/2019   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   BPH (benign prostatic hyperplasia) 07/02/2015   Hallux valgus of right foot 03/26/2014   History of colon cancer 01/27/2014   Iritis 12/17/2008   Postherpetic neuralgia 05/22/2008   Bigeminy 04/21/2008   Diabetes mellitus type II, controlled 04/23/2007   Hyperlipidemia associated with type 2 diabetes mellitus 04/23/2007   Obstructive sleep apnea 04/23/2007   Essential hypertension 04/23/2007    ONSET DATE: Chronic/gradual onset; Script 09/20/22   REFERRING DIAG:  R47.9 (ICD-10-CM) - Speech disturbance, unspecified type      THERAPY DIAG:  Aphasia  Cognitive communication deficit  Rationale for Evaluation and Treatment: Rehabilitation  SUBJECTIVE:   SUBJECTIVE STATEMENT: Pt frustrated with HEP, does not understand how is supposed to help him.  Pt accompanied by: significant other : wife-[name]  PERTINENT HISTORY: 82 y.o. RH male with a history of hypertension, hyperlipidemia, diabetes, sleep apnea on CPAP, colon cancer s/p resection, prior history of H. pylori,   and a history of mild cognitive impairment, with etiology still concerning for Alzheimer's disease with vascular contribution.   PAIN:  Are you having pain? No  FALLS: Has patient fallen in last 6 months?  No  PATIENT GOALS: Improve word finding  OBJECTIVE:   DIAGNOSTIC FINDINGS:  Neurocognitive testing 11/2020 Dr. [name] Briefly, results suggested noted performance variability surrounding executive functioning, semantic fluency, confrontation naming, and encoding (i.e., encoding) and retrieval aspects of memory. Performance was generally appropriate relative to age-matched peers across processing speed, receptive language, phonemic fluency, and visuospatial abilities. Relative to his evaluation one year prior, his most notable areas of decline surrounded executive functioning and attention/concentration. These were generally mild overall. Regarding etiology, there remain some concerns surrounding Alzheimer's disease based upon memory dysfunction, coupled with deficits in confrontation naming, semantic fluency, and executive functioning. As such, I cannot rule out this diagnosis. However, despite scores suggesting delayed memory falling in the exceptionally low normative range, he was able to demonstrate some appropriate retrieval/consolidation abilities, which would not suggest characteristic rapid forgetting. If Alzheimer's disease is indeed the underlying pathology, it appears to be progressing slowly, likely aided  by current medication intervention and regular physical/mental stimulation.   Pt has current script for MR Head -pending  MRI HEAD WITHOUT AND WITH CONTRAST 12/22/2019 COMPARISON:  None.  FINDINGS: Brain: No recent infarction, hemorrhage, hydrocephalus, extra-axial collection or mass lesion. Chronic small vessel ischemia in the cerebral white matter, mild for age. Cerebral volume loss without specific pattern, relatively mild for age. No chronic blood products or abnormal  mineralization. No abnormal enhancement. Vascular: Preserved flow voids. Tortuous basilar impressing on the pons. Skull and upper cervical spine: Normal marrow signal Sinuses/Orbits: Mucosal thickening mainly in ethmoid sinuses. IMPRESSION: 1. No reversible finding or specific explanation for symptoms. 2. Mild for age white matter disease and generalized volume loss.   PATIENT REPORTED OUTCOME MEASURES (PROM): Communication Effectiveness Survey: Pt answered this PROM with 31/32 (higher scores indicate more effective communication, and wife scored pt's communication effectiveness 24/32. SLP suspects pt's cognitive deficits may play a role in his reduced awareness of deficit or a reduced insight into his severity of deficit.    TODAY'S TREATMENT:                                                                                                                                         DATE:  10/27/22:  Pt was seen for skilled ST services targeting communication strategies. Wife joined for today's session. SLP edu on supported conversation strategies re: getting attention before speaking, providing time for processing, providing choices vs. Open ended questions, checking for clarification, thinking of "big idea", topic changing using predetermined phrase. Pt reports these are extremely helpful and believes they can be immediately implemented at home. Pt reports he does NOT like to complete papers at home that are not "relevant" to him. SLP explained that HEP is provided to continue practicing tasks that are learned in therapy for better generalization. Pt reports if given papers that are not relevant, he likely won't do them. Wife confirms this makes him upset.   10/24/22: Target use of description as anomia strategy with use of Semantic Feature Analysis as framework. SLP provides education regarding rationale and purpose, models with simple target "apple." Following, pt able to generate 5/6 features of x5  targets with usual mod-A cues from SLP. Pt benefiting from questioning, semantic, and phonemic cues to aid in completion of activity.   10/20/22: Pt went to Dr. [name] for a call-list appointment yesterday for neuropsych testing. Today pt handed back homework (similarity/difference) with initial following of directions but after completion of 1/4 of the stimuli pt began to only indicate differences. When questioned about this pt stated the instructions were to put differences and said "I don't think I read the instructions." SLP reminded pt he began task correctly and pt corrected himself. "I don't know why I did that." SLP reviewed this paper with pt and by the third stimuli, pt had reverted to only telling differences, and SLP had to  cue pt occasionally faded to rarely to also tell similarity for each set of words.  Pt told SLP when he was d/c'd he had many more meds and he worked out a schedule for them, with wife's help. SLP questions amount of wife's assistance for this task, given how pt is performing in ST with alternating attention/memory.   10/17/22: Vernona Rieger has not been able to cue pt as much at home due to being unsure of the word pt is struggling to generate. Pt could not generate "Karin Golden" or "[place]" in explanation of where his [place] is where he meets "the guys". Wife assisted pt with anomia correctly once, and provided the first letter of anomic word x1. He described feelings for anomia in "s", above re: [place] but expressed frustration about anomia with Karin Golden. SLP suggested pt write down 5 sentences with Karin Golden in them and write/read/listen to the sentences.  SLP provided memory strategies and will go over these with pt next session, in order to improve the flow of conversation/communication at home.   10/10/22 (eval): SLP discussed eval results and home tasks for pt to engage in with wife's help if necessary (5 sentences out of anomic word, once discovered). SLP also  discussed how to assist pt at home (phonemic cues if wife knew what word pt was attempting to say).  PATIENT EDUCATION: Education details: see "today's treatment" Person educated: Patient and Spouse Education method: Explanation, Demonstration, and Verbal cues Education comprehension: verbalized understanding, verbal cues required, and needs further education   GOALS: Goals reviewed with patient? Yes  SHORT TERM GOALS: Target date: 11/08/22  Pt will tell SLP 4 strategies for word finding in two sessions Baseline: Goal status: INITIAL  2.  Pt will demo [facility] word finding in 10 minutes simple conversation using compensations in 3 sessions Baseline:  Goal status: INITIAL  3.  Pt will name 8 items in a simple category with rare min A in 2 sessions Baseline:  Goal status: INITIAL  4.  Pt and wife will demo modified independence with SFA and VNEST Baseline:  Goal status: INITIAL   LONG TERM GOALS: Target date: 12/09/22  Pt will demo [facility] word finding in 10 minutes simple-mod complex conversation using compensations in 3 sessions Baseline:  Goal status: INITIAL  2.  Pt will report to SLP he is using at least 2 memory strategies at home between 3 sessions, or use a memory strategy in 2 sessions  Baseline:  Goal status: INITIAL  3.  Pt will report less frustration associated with anomia than initially prior to ST Baseline:  Goal status: INITIAL  4.  Pt will score higher when administered PROM in the last 1-2 sessions compared to initial administration Baseline:  Goal status: INITIAL  ASSESSMENT:  CLINICAL IMPRESSION: Patient is a 82 y.o. M who was seen today for cognitive-communication in light of questionable etiology. See tx note. SLP to add cognitive goals PRN following results from neuropsych testing shared with pt/wife on 10/27/22.  OBJECTIVE IMPAIRMENTS: include attention, executive functioning, and aphasia. These impairments are limiting patient from household  responsibilities, ADLs/IADLs, and effectively communicating at home and in community. Factors affecting potential to achieve goals and functional outcome are  none toda . Patient will benefit from skilled SLP services to address above impairments and improve overall function.  REHAB POTENTIAL: Fair depends upon diagnosis; pt agrees that SLP can do some things with him to explore feasibility of direct language therapy asissting pt's expressive language.  PLAN:  SLP FREQUENCY: 2x/week  SLP DURATION: 6 weeks  PLANNED INTERVENTIONS: Language facilitation, Environmental controls, Cueing hierachy, Cognitive reorganization, Internal/external aids, Functional tasks, Multimodal communication approach, SLP instruction and feedback, Compensatory strategies, and Patient/family education    Maia Breslow, CCC-SLP 10/24/2022, 3:32 PM

## 2022-10-28 ENCOUNTER — Ambulatory Visit (HOSPITAL_COMMUNITY)
Admission: RE | Admit: 2022-10-28 | Discharge: 2022-10-28 | Disposition: A | Discharge status: Home / Self Care | Payer: Medicare Other | Source: Ambulatory Visit | Attending: Family Medicine | Admitting: Family Medicine

## 2022-10-28 DIAGNOSIS — R911 Solitary pulmonary nodule: Secondary | ICD-10-CM | POA: Diagnosis present

## 2022-10-28 LAB — GLUCOSE, CAPILLARY: Glucose-Capillary: 169 mg/dL — ABNORMAL HIGH (ref 70–99)

## 2022-10-28 MED ORDER — FLUDEOXYGLUCOSE F - 18 (FDG) INJECTION
9.3300 | Freq: Once | INTRAVENOUS | Status: AC
  Administered 2022-10-28: 9.33 via INTRAVENOUS

## 2022-10-30 ENCOUNTER — Ambulatory Visit
Admission: RE | Admit: 2022-10-30 | Discharge: 2022-10-30 | Disposition: A | Discharge status: Home / Self Care | Payer: Medicare Other | Source: Ambulatory Visit | Attending: Physician Assistant | Admitting: Physician Assistant

## 2022-10-31 ENCOUNTER — Ambulatory Visit: Payer: Medicare Other | Attending: Physician Assistant

## 2022-10-31 DIAGNOSIS — R4701 Aphasia: Secondary | ICD-10-CM | POA: Diagnosis present

## 2022-10-31 DIAGNOSIS — R41841 Cognitive communication deficit: Secondary | ICD-10-CM | POA: Insufficient documentation

## 2022-10-31 NOTE — Patient Instructions (Addendum)
    To aid in communication:  For the speaker: get Cash's attention before speaking provide time for processing provide choices vs. Open ended questions check for clarification  For Romon: Think of "big idea" to assist in describing/articulating idea  For both: Change topic using a predetermined phrase - "So, changing topics..", or "OK, next topic..", etc

## 2022-10-31 NOTE — Therapy (Signed)
OUTPATIENT SPEECH LANGUAGE PATHOLOGY APHASIA TREATMENT   Patient Name: Walter [name] MRN: [id number] DOB:10-16-1940, 82 y.o., male Today's Date: 10/31/2022  PCP: Shelva Majestic., MD REFERRING PROVIDER: Marlowe Kays, PA-C  END OF SESSION:  End of Session - 10/31/22 1754     Visit Number 6    Number of Visits 13    Date for SLP Re-Evaluation 12/09/22   date is out two weeks due to pt on vacation for two weeks   SLP Start Time 1403    SLP Stop Time  1445    SLP Time Calculation (min) 42 min    Activity Tolerance Patient tolerated treatment well                Past Medical History:  Diagnosis Date   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   S/p 2nd ray amputation 2018- started after prolonged use of corn pad   Acute upper GI bleeding 01/26/2022   Allergic rhinitis 08/19/2019   flonase   Anemia    BPH (benign prostatic hyperplasia) 07/02/2015   S/p TURP with multiple complications afterwards including recurrent hospitalizations. All started after a hemorrhoid surgery and later with urinary retention. Patient at one point was septic from urinary issues.     Diabetes mellitus type II, controlled 04/23/2007    Metformin 1g BID, amaryl 4mg --> 8mg , had to add [place] back  Never took victoza.   Januvia-lethargic and dizzy in past. 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 Follows with Dr. 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   Pulmonary nodule 02/04/2022   Sepsis secondary to UTI 03/29/2015   Past Surgical History:  Procedure Laterality Date   AMPUTATION TOE Right 09/14/2016   Procedure: RIGHT 2ND TOE/RAY AMPUTATION;  Surgeon: Tarry Kos, MD;  Location: [facility];  Service: Orthopedics;  Laterality: Right;   APPENDECTOMY  05/03/1947   BIOPSY  01/28/2022   Procedure: BIOPSY;  Surgeon: Sherrilyn Rist, MD;  Location: [facility] ENDOSCOPY;  Service: Gastroenterology;;   COLECTOMY  05/02/1990   CYSTOSCOPY N/A 06/21/2015   Procedure: CYSTOSCOPY, CLOT EVACUATION, AND CAUTERIZATION OF PROSTATE FOSSA;  Surgeon: Jethro Bolus, MD;  Location: [facility];  Service: Urology;  Laterality: N/A;   ESOPHAGOGASTRODUODENOSCOPY (EGD) WITH PROPOFOL N/A 01/26/2022   Procedure: ESOPHAGOGASTRODUODENOSCOPY (EGD) WITH PROPOFOL;  Surgeon: Sherrilyn Rist, MD;  Location: [facility] ENDOSCOPY;  Service: Gastroenterology;  Laterality: N/A;   ESOPHAGOGASTRODUODENOSCOPY (EGD) WITH PROPOFOL N/A 01/28/2022   Procedure: ESOPHAGOGASTRODUODENOSCOPY (EGD) WITH PROPOFOL;  Surgeon: Sherrilyn Rist, MD;  Location: [facility] ENDOSCOPY;  Service: Gastroenterology;  Laterality: N/A;   HEMORRHOID SURGERY     HEMOSTASIS CLIP PLACEMENT  01/26/2022   Procedure: HEMOSTASIS CLIP PLACEMENT;  Surgeon: Sherrilyn Rist, MD;  Location: [facility] ENDOSCOPY;  Service: Gastroenterology;;   HEMOSTASIS CONTROL  01/26/2022   Procedure: HEMOSTASIS CONTROL;  Surgeon: Sherrilyn Rist, MD;  Location: [facility] ENDOSCOPY;  Service: Gastroenterology;;   HOT HEMOSTASIS N/A 01/26/2022   Procedure: HOT HEMOSTASIS (ARGON PLASMA COAGULATION/BICAP);  Surgeon: Sherrilyn Rist, MD;  Location: [facility] ENDOSCOPY;  Service: Gastroenterology;  Laterality: N/A;   MOLE REMOVAL  2022   hand   SCLEROTHERAPY  01/26/2022   Procedure: SCLEROTHERAPY;  Surgeon: Sherrilyn Rist, MD;  Location: [facility] ENDOSCOPY;  Service: Gastroenterology;;   TOE SURGERY  04/2020   TRANSURETHRAL RESECTION OF PROSTATE N/A 05/23/2015   Procedure: TRANSURETHRAL RESECTION OF THE PROSTATE (TURP);  Surgeon: Bjorn Pippin, MD;  Location: [facility];  Service: Urology;  Laterality: N/A;   Patient Active Problem List   Diagnosis Date Noted   History of dysplastic nevus    History of skin cancer    Pulmonary nodule 02/04/2022   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   Duodenal ulcer hemorrhage    Mild neurocognitive disorder, concerns for Alzheimer's disease 12/23/2019   Allergic rhinitis 08/19/2019   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   BPH (benign prostatic hyperplasia) 07/02/2015   Hallux valgus of right foot 03/26/2014   History of colon cancer 01/27/2014   Iritis 12/17/2008   Postherpetic neuralgia 05/22/2008   Bigeminy 04/21/2008   Diabetes mellitus type II, controlled 04/23/2007   Hyperlipidemia associated with type 2 diabetes mellitus 04/23/2007   Obstructive sleep apnea 04/23/2007   Essential hypertension 04/23/2007    ONSET DATE: Chronic/gradual onset; Script 09/20/22   REFERRING DIAG:  R47.9 (ICD-10-CM) - Speech disturbance, unspecified type      THERAPY DIAG:  Aphasia  Cognitive communication deficit  Rationale for Evaluation and Treatment: Rehabilitation  SUBJECTIVE:   SUBJECTIVE STATEMENT: Pt told SLP that he will be out for two weeks beginning next week due to vacation.  Pt accompanied by: significant other : wife-[name]  PERTINENT HISTORY: 49 y.o. RH male with a history of hypertension, hyperlipidemia, diabetes, sleep apnea on CPAP, colon cancer s/p resection, prior history of H.  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   PAIN:  Are you having pain? No  FALLS: Has patient fallen in last 6 months?  No  PATIENT GOALS: Improve word finding  OBJECTIVE:   DIAGNOSTIC FINDINGS:  Neurocognitive testing 11/2020 Dr. [name] Briefly, results suggested noted performance variability surrounding executive functioning, semantic fluency, confrontation naming, and encoding (i.e., encoding) and retrieval aspects of memory. Performance was generally appropriate relative to age-matched peers across processing speed, receptive language, phonemic fluency, and visuospatial abilities. Relative to his evaluation one year prior, his most notable areas of decline surrounded executive functioning and attention/concentration. These were generally mild overall. Regarding etiology, there remain some concerns surrounding Alzheimer's disease based upon memory dysfunction, coupled with deficits in confrontation naming, semantic fluency, and executive functioning. As such, I cannot rule out this diagnosis. However, despite scores suggesting delayed memory falling in the exceptionally low normative range, he was able to demonstrate some appropriate retrieval/consolidation abilities, which would not suggest characteristic rapid forgetting. If Alzheimer's disease is indeed the underlying pathology, it appears to be  progressing slowly, likely aided by current medication intervention and regular physical/mental stimulation.   Pt has current script for MR Head -pending  MRI HEAD WITHOUT AND WITH CONTRAST 12/22/2019 COMPARISON:  None.  FINDINGS: Brain: No recent infarction, hemorrhage, hydrocephalus, extra-axial collection or mass lesion. Chronic small vessel ischemia in the cerebral white matter, mild for age. Cerebral volume loss without specific pattern, relatively mild for age. No chronic blood products or abnormal  mineralization. No abnormal enhancement. Vascular: Preserved flow voids. Tortuous basilar impressing on the pons. Skull and upper cervical spine: Normal marrow signal Sinuses/Orbits: Mucosal thickening mainly in ethmoid sinuses. IMPRESSION: 1. No reversible finding or specific explanation for symptoms. 2. Mild for age white matter disease and generalized volume loss.   PATIENT REPORTED OUTCOME MEASURES (PROM): Communication Effectiveness Survey: Pt answered this PROM with 31/32 (higher scores indicate more effective communication, and wife scored pt's communication effectiveness 24/32. SLP suspects pt's cognitive deficits may play a role in his reduced awareness of deficit or a reduced insight into his severity of deficit.    TODAY'S TREATMENT:                                                                                                                                         DATE:  10/31/22: SLP reviewed communication strategies with pt from last session. PT did not recall using any of these strategies since previous ST. SLP provided rationale for these strategies.  SLP then engaged pt in functional conversational therapy. As patient had difficulty with word finding and thought organization to express himself verbally, SLP cued patient to use compensations. The most effective was encouraging Asad to communicate "the big picture/idea". This occurred x4 today and pt did not always engage in this practice and req'd cues from SLP to do so.  SLP reiterated the rationale for this to patient, and patient agreed this is a good practice to begin to use.  10/27/22:  Pt was seen for skilled ST services targeting communication strategies. Wife joined for today's session. SLP edu on supported conversation strategies re: getting attention before speaking, providing time for processing, providing choices vs. Open ended questions, checking for clarification, thinking of "big idea", topic changing using  predetermined phrase. Pt reports these are extremely helpful and believes they can be immediately implemented at home. Pt reports he does NOT like to complete papers at home that are not "relevant" to him. SLP explained that HEP is provided to continue practicing tasks that are learned in therapy for better generalization. Pt reports if given papers that are not relevant, he likely won't do them. Wife confirms this makes him upset.   10/24/22: Target use of description as anomia strategy with use of Semantic Feature Analysis as framework. SLP provides education regarding rationale and purpose, models with simple target "apple." Following, pt able to generate 5/6 features of x5 targets with usual mod-A cues from SLP. Pt  benefiting from questioning, semantic, and phonemic cues to aid in completion of activity.   10/20/22: Pt went to Dr. [name] for a call-list appointment yesterday for neuropsych testing. Today pt handed back homework (similarity/difference) with initial following of directions but after completion of 1/4 of the stimuli pt began to only indicate differences. When questioned about this pt stated the instructions were to put differences and said "I don't think I read the instructions." SLP reminded pt he began task correctly and pt corrected himself. "I don't know why I did that." SLP reviewed this paper with pt and by the third stimuli, pt had reverted to only telling differences, and SLP had to cue pt occasionally faded to rarely to also tell similarity for each set of words.  Pt told SLP when he was d/c'd he had many more meds and he worked out a schedule for them, with wife's help. SLP questions amount of wife's assistance for this task, given how pt is performing in ST with alternating attention/memory.   10/17/22: Vernona Rieger has not been able to cue pt as much at home due to being unsure of the word pt is struggling to generate. Pt could not generate "Karin Golden" or "[place]" in explanation of  where his [place] is where he meets "the guys". Wife assisted pt with anomia correctly once, and provided the first letter of anomic word x1. He described feelings for anomia in "s", above re: [place] but expressed frustration about anomia with Karin Golden. SLP suggested pt write down 5 sentences with Karin Golden in them and write/read/listen to the sentences.  SLP provided memory strategies and will go over these with pt next session, in order to improve the flow of conversation/communication at home.   10/10/22 (eval): SLP discussed eval results and home tasks for pt to engage in with wife's help if necessary (5 sentences out of anomic word, once discovered). SLP also discussed how to assist pt at home (phonemic cues if wife knew what word pt was attempting to say).  PATIENT EDUCATION: Education details: see "today's treatment" Person educated: Patient and Spouse Education method: Explanation, Demonstration, and Verbal cues Education comprehension: verbalized understanding, verbal cues required, and needs further education   GOALS: Goals reviewed with patient? Yes  SHORT TERM GOALS: Target date: 11/08/22  Pt will tell SLP 4 strategies for word finding in two sessions Baseline: Goal status: INITIAL  2.  Pt will demo [facility] word finding in 10 minutes simple conversation using compensations in 3 sessions Baseline:  Goal status: INITIAL  3.  Pt will name 8 items in a simple category with rare min A in 2 sessions Baseline:  Goal status: INITIAL  4.  Pt and wife will demo modified independence with SFA and VNEST Baseline:  Goal status: INITIAL   LONG TERM GOALS: Target date: 12/09/22  Pt will demo [facility] word finding in 10 minutes simple-mod complex conversation using compensations in 3 sessions Baseline:  Goal status: INITIAL  2.  Pt will report to SLP he is using at least 2 memory strategies at home between 3 sessions, or use a memory strategy in 2 sessions  Baseline:  Goal status:  INITIAL  3.  Pt will report less frustration associated with anomia than initially prior to ST Baseline:  Goal status: INITIAL  4.  Pt will score higher when administered PROM in the last 1-2 sessions compared to initial administration Baseline:  Goal status: INITIAL  ASSESSMENT:  CLINICAL IMPRESSION: Patient is a 82 y.o. M who  was seen today for cognitive-communication in light of questionable etiology. See tx note. SLP to add cognitive goals PRN following results from neuropsych testing shared with pt/wife on 10/27/22.  OBJECTIVE IMPAIRMENTS: include attention, executive functioning, and aphasia. These impairments are limiting patient from household responsibilities, ADLs/IADLs, and effectively communicating at home and in community. Factors affecting potential to achieve goals and functional outcome are  none toda . Patient will benefit from skilled SLP services to address above impairments and improve overall function.  REHAB POTENTIAL: Fair depends upon diagnosis; pt agrees that SLP can do some things with him to explore feasibility of direct language therapy asissting pt's expressive language.  PLAN:  SLP FREQUENCY: 2x/week  SLP DURATION: 6 weeks  PLANNED INTERVENTIONS: Language facilitation, Environmental controls, Cueing hierachy, Cognitive reorganization, Internal/external aids, Functional tasks, Multimodal communication approach, SLP instruction and feedback, Compensatory strategies, and Patient/family education    [facility], CCC-SLP 10/31/2022, 5:54 PM

## 2022-11-01 ENCOUNTER — Ambulatory Visit: Payer: Medicare Other | Admitting: Podiatry

## 2022-11-02 ENCOUNTER — Other Ambulatory Visit: Payer: Medicare Other

## 2022-11-02 ENCOUNTER — Ambulatory Visit: Payer: Medicare Other

## 2022-11-02 DIAGNOSIS — R4701 Aphasia: Secondary | ICD-10-CM

## 2022-11-02 DIAGNOSIS — R41841 Cognitive communication deficit: Secondary | ICD-10-CM

## 2022-11-02 NOTE — Therapy (Signed)
OUTPATIENT SPEECH LANGUAGE PATHOLOGY APHASIA TREATMENT   Patient Name: Walter [name] MRN: [id number] DOB:July 10, 1940, 82 y.o., male Today's Date: 11/02/2022  PCP: Shelva Majestic., MD REFERRING PROVIDER: Marlowe Kays, PA-C  END OF SESSION:  End of Session - 11/02/22 1719     Visit Number 7    Number of Visits 13    Date for SLP Re-Evaluation 12/09/22   date is out two weeks due to pt on vacation for two weeks   SLP Start Time 1533    SLP Stop Time  1615    SLP Time Calculation (min) 42 min    Activity Tolerance Patient tolerated treatment well                 Past Medical History:  Diagnosis Date   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   S/p 2nd ray amputation 2018- started after prolonged use of corn pad   Acute upper GI bleeding 01/26/2022   Allergic rhinitis 08/19/2019   flonase   Anemia    BPH (benign prostatic hyperplasia) 07/02/2015   S/p TURP with multiple complications afterwards including recurrent hospitalizations. All started after a hemorrhoid surgery and later with urinary retention. Patient at one point was septic from urinary issues.     Diabetes mellitus type II, controlled 04/23/2007    Metformin 1g BID, amaryl 4mg --> 8mg , had to add [place] back  Never took victoza.   Januvia-lethargic and dizzy in past. 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 Follows with Dr. 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   Pulmonary nodule 02/04/2022   Sepsis secondary to UTI 03/29/2015   Past Surgical History:  Procedure Laterality Date   AMPUTATION TOE Right 09/14/2016   Procedure: RIGHT 2ND TOE/RAY AMPUTATION;  Surgeon: Tarry Kos, MD;  Location:  SURGERY CENTER;  Service: Orthopedics;  Laterality: Right;   APPENDECTOMY  05/03/1947   BIOPSY  01/28/2022   Procedure: BIOPSY;  Surgeon: Sherrilyn Rist, MD;  Location: [facility] ENDOSCOPY;  Service: Gastroenterology;;   COLECTOMY  05/02/1990   CYSTOSCOPY N/A 06/21/2015   Procedure: CYSTOSCOPY, CLOT EVACUATION, AND CAUTERIZATION OF PROSTATE FOSSA;  Surgeon: Jethro Bolus, MD;  Location: [facility]S;  Service: Urology;  Laterality: N/A;   ESOPHAGOGASTRODUODENOSCOPY (EGD) WITH PROPOFOL N/A 01/26/2022   Procedure: ESOPHAGOGASTRODUODENOSCOPY (EGD) WITH PROPOFOL;  Surgeon: Sherrilyn Rist, MD;  Location: [facility] ENDOSCOPY;  Service: Gastroenterology;  Laterality: N/A;   ESOPHAGOGASTRODUODENOSCOPY (EGD) WITH PROPOFOL N/A 01/28/2022   Procedure: ESOPHAGOGASTRODUODENOSCOPY (EGD) WITH PROPOFOL;  Surgeon: Sherrilyn Rist, MD;  Location: [facility]  ENDOSCOPY;  Service: Gastroenterology;  Laterality: N/A;   HEMORRHOID SURGERY     HEMOSTASIS CLIP PLACEMENT  01/26/2022   Procedure: HEMOSTASIS CLIP PLACEMENT;  Surgeon: Sherrilyn Rist, MD;  Location: [facility] ENDOSCOPY;  Service: Gastroenterology;;   HEMOSTASIS CONTROL  01/26/2022   Procedure: HEMOSTASIS CONTROL;  Surgeon: Sherrilyn Rist, MD;  Location: [facility] ENDOSCOPY;  Service: Gastroenterology;;   HOT HEMOSTASIS N/A 01/26/2022   Procedure: HOT HEMOSTASIS (ARGON PLASMA COAGULATION/BICAP);  Surgeon: Sherrilyn Rist, MD;  Location: [facility] ENDOSCOPY;  Service: Gastroenterology;  Laterality: N/A;   MOLE REMOVAL  2022   hand   SCLEROTHERAPY  01/26/2022   Procedure: SCLEROTHERAPY;  Surgeon: Sherrilyn Rist, MD;  Location: [facility] ENDOSCOPY;  Service: Gastroenterology;;   TOE SURGERY  04/2020   TRANSURETHRAL RESECTION OF PROSTATE N/A 05/23/2015   Procedure: TRANSURETHRAL RESECTION OF THE PROSTATE (TURP);  Surgeon: Bjorn Pippin, MD;  Location: [facility] OR[facility];  Service: Urology;  Laterality: N/A;   Patient Active Problem List   Diagnosis Date Noted   History of dysplastic nevus    History of skin cancer    Pulmonary nodule 02/04/2022   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   Duodenal ulcer hemorrhage    Mild neurocognitive disorder, concerns for Alzheimer's disease 12/23/2019   Allergic rhinitis 08/19/2019   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   BPH (benign prostatic hyperplasia) 07/02/2015   Hallux valgus of right foot 03/26/2014   History of colon cancer 01/27/2014   Iritis 12/17/2008   Postherpetic neuralgia 05/22/2008   Bigeminy 04/21/2008   Diabetes mellitus type II, controlled 04/23/2007   Hyperlipidemia associated with type 2 diabetes mellitus 04/23/2007   Obstructive sleep apnea 04/23/2007   Essential hypertension 04/23/2007    ONSET DATE: Chronic/gradual onset; Script 09/20/22   REFERRING DIAG:  R47.9 (ICD-10-CM) - Speech disturbance, unspecified type      THERAPY DIAG:  Aphasia  Cognitive communication deficit  Rationale for Evaluation and Treatment: Rehabilitation  SUBJECTIVE:   SUBJECTIVE STATEMENT: "You know what the last one I had was the last day I was here." Pt accompanied by: significant other : [name]  PERTINENT HISTORY: 16 y.o. RH male with a history of hypertension, hyperlipidemia, diabetes, sleep apnea on CPAP, colon cancer s/p resection, prior history of H. pylori,  and a  history of mild cognitive impairment, with etiology still concerning for Alzheimer's disease with vascular contribution.   PAIN:  Are you having pain? No  FALLS: Has patient fallen in last 6 months?  No  PATIENT GOALS: Improve word finding  OBJECTIVE:   DIAGNOSTIC FINDINGS:  Neurocognitive testing 11/2020 Dr. [name] Briefly, results suggested noted performance variability surrounding executive functioning, semantic fluency, confrontation naming, and encoding (i.e., encoding) and retrieval aspects of memory. Performance was generally appropriate relative to age-matched peers across processing speed, receptive language, phonemic fluency, and visuospatial abilities. Relative to his evaluation one year prior, his most notable areas of decline surrounded executive functioning and attention/concentration. These were generally mild overall. Regarding etiology, there remain some concerns surrounding Alzheimer's disease based upon memory dysfunction, coupled with deficits in confrontation naming, semantic fluency, and executive functioning. As such, I cannot rule out this diagnosis. However, despite scores suggesting delayed memory falling in the exceptionally low normative range, he was able to demonstrate some appropriate retrieval/consolidation abilities, which would not suggest characteristic rapid forgetting. If Alzheimer's disease is indeed the underlying pathology, it appears to be progressing  slowly, likely aided by current medication intervention and regular physical/mental stimulation.   Pt has current script for MR Head -pending  MRI HEAD WITHOUT AND WITH CONTRAST 12/22/2019 COMPARISON:  None.  FINDINGS: Brain: No recent infarction, hemorrhage, hydrocephalus, extra-axial collection or mass lesion. Chronic small vessel ischemia in the cerebral white matter, mild for age. Cerebral volume loss without specific pattern, relatively mild for age. No chronic blood products or abnormal mineralization.  No abnormal enhancement. Vascular: Preserved flow voids. Tortuous basilar impressing on the pons. Skull and upper cervical spine: Normal marrow signal Sinuses/Orbits: Mucosal thickening mainly in ethmoid sinuses. IMPRESSION: 1. No reversible finding or specific explanation for symptoms. 2. Mild for age white matter disease and generalized volume loss.   PATIENT REPORTED OUTCOME MEASURES (PROM): Communication Effectiveness Survey: Pt answered this PROM with 31/32 (higher scores indicate more effective communication, and wife scored pt's communication effectiveness 24/32. SLP suspects pt's cognitive deficits may play a role in his reduced awareness of deficit or a reduced insight into his severity of deficit.    TODAY'S TREATMENT:                                                                                                                                         DATE:  11/02/22: Pt reported that his word finding skills are improved, the last episode he recalls was Monday. SLP copied off "Top 10 Word Finding Strategies for Aphasia" and reviewed with pt, with examples provided. Pt had an episode with anomia ("frame") during the session and SLP encouraged pt to use "find it" strategy, which he did and had success.  Next session SLP to talk with pt and wife about what he would like to cont to work on in ST.    10/31/22: SLP reviewed communication strategies with pt from last session. PT did not recall using any of these strategies since previous ST. SLP provided rationale for these strategies.  SLP then engaged pt in functional conversational therapy. As patient had difficulty with word finding and thought organization to express himself verbally, SLP cued patient to use compensations. The most effective was encouraging Allison to communicate "the big picture/idea". This occurred x4 today and pt did not always engage in this practice and req'd cues from SLP to do so.  SLP reiterated the rationale for this to  patient, and patient agreed this is a good practice to begin to use.  10/27/22:  Pt was seen for skilled ST services targeting communication strategies. Wife joined for today's session. SLP edu on supported conversation strategies re: getting attention before speaking, providing time for processing, providing choices vs. Open ended questions, checking for clarification, thinking of "big idea", topic changing using predetermined phrase. Pt reports these are extremely helpful and believes they can be immediately implemented at home. Pt reports he does NOT like to complete papers at home that are not "relevant" to him. SLP explained  that HEP is provided to continue practicing tasks that are learned in therapy for better generalization. Pt reports if given papers that are not relevant, he likely won't do them. Wife confirms this makes him upset.   10/24/22: Target use of description as anomia strategy with use of Semantic Feature Analysis as framework. SLP provides education regarding rationale and purpose, models with simple target "apple." Following, pt able to generate 5/6 features of x5 targets with usual mod-A cues from SLP. Pt benefiting from questioning, semantic, and phonemic cues to aid in completion of activity.   10/20/22: Pt went to Dr. [name] for a call-list appointment yesterday for neuropsych testing. Today pt handed back homework (similarity/difference) with initial following of directions but after completion of 1/4 of the stimuli pt began to only indicate differences. When questioned about this pt stated the instructions were to put differences and said "I don't think I read the instructions." SLP reminded pt he began task correctly and pt corrected himself. "I don't know why I did that." SLP reviewed this paper with pt and by the third stimuli, pt had reverted to only telling differences, and SLP had to cue pt occasionally faded to rarely to also tell similarity for each set of words.  Pt told SLP  when he was d/c'd he had many more meds and he worked out a schedule for them, with wife's help. SLP questions amount of wife's assistance for this task, given how pt is performing in ST with alternating attention/memory.   10/17/22: Vernona Rieger has not been able to cue pt as much at home due to being unsure of the word pt is struggling to generate. Pt could not generate "Karin Golden" or "[place]" in explanation of where his [place] is where he meets "the guys". Wife assisted pt with anomia correctly once, and provided the first letter of anomic word x1. He described feelings for anomia in "s", above re: [place] but expressed frustration about anomia with Karin Golden. SLP suggested pt write down 5 sentences with Karin Golden in them and write/read/listen to the sentences.  SLP provided memory strategies and will go over these with pt next session, in order to improve the flow of conversation/communication at home.   10/10/22 (eval): SLP discussed eval results and home tasks for pt to engage in with wife's help if necessary (5 sentences out of anomic word, once discovered). SLP also discussed how to assist pt at home (phonemic cues if wife knew what word pt was attempting to say).  PATIENT EDUCATION: Education details: see "today's treatment" Person educated: Patient and Spouse Education method: Explanation, Demonstration, and Verbal cues Education comprehension: verbalized understanding, verbal cues required, and needs further education   GOALS: Goals reviewed with patient? Yes  SHORT TERM GOALS: Target date: 11/08/22  Pt will tell SLP 4 strategies for word finding in two sessions Baseline: Goal status: INITIAL  2.  Pt will demo [facility] word finding in 10 minutes simple conversation using compensations in 3 sessions Baseline:  Goal status: INITIAL  3.  Pt will name 8 items in a simple category with rare min A in 2 sessions Baseline:  Goal status: INITIAL  4.  Pt and wife will demo modified  independence with SFA and VNEST Baseline:  Goal status: INITIAL   LONG TERM GOALS: Target date: 12/09/22  Pt will demo [facility] word finding in 10 minutes simple-mod complex conversation using compensations in 3 sessions Baseline:  Goal status: INITIAL  2.  Pt will report to SLP he  is using at least 2 memory strategies at home between 3 sessions, or use a memory strategy in 2 sessions  Baseline:  Goal status: INITIAL  3.  Pt will report less frustration associated with anomia than initially prior to ST Baseline:  Goal status: INITIAL  4.  Pt will score higher when administered PROM in the last 1-2 sessions compared to initial administration Baseline:  Goal status: INITIAL  ASSESSMENT:  CLINICAL IMPRESSION: Patient is a 82 y.o. M who was seen today for cognitive-communication in light of questionable etiology. See tx note. SLP to talk to pt/wife about possibility of adding cognitive goals following results from neuropsych testing shared with pt/wife on 10/27/22.  OBJECTIVE IMPAIRMENTS: include attention, executive functioning, and aphasia. These impairments are limiting patient from household responsibilities, ADLs/IADLs, and effectively communicating at home and in community. Factors affecting potential to achieve goals and functional outcome are  none toda . Patient will benefit from skilled SLP services to address above impairments and improve overall function.  REHAB POTENTIAL: Fair depends upon diagnosis; pt agrees that SLP can do some things with him to explore feasibility of direct language therapy asissting pt's expressive language.  PLAN:  SLP FREQUENCY: 2x/week  SLP DURATION: 6 weeks  PLANNED INTERVENTIONS: Language facilitation, Environmental controls, Cueing hierachy, Cognitive reorganization, Internal/external aids, Functional tasks, Multimodal communication approach, SLP instruction and feedback, Compensatory strategies, and Patient/family education    [facility],  CCC-SLP 11/02/2022, 5:19 PM

## 2022-11-07 NOTE — Progress Notes (Signed)
MRI brain shows some chronic age related changes similar to the brain MRI of 2021. There is some atrophy of the brain which may contribute to memory changes. No acute findings

## 2022-11-08 NOTE — Progress Notes (Signed)
Left another message at 344 11/08/2022

## 2022-11-08 NOTE — Progress Notes (Signed)
No answer at 3:05 11/08/2022

## 2022-11-10 ENCOUNTER — Encounter: Payer: Self-pay | Admitting: Gastroenterology

## 2022-11-23 ENCOUNTER — Ambulatory Visit: Payer: Medicare Other

## 2022-11-23 DIAGNOSIS — R4701 Aphasia: Secondary | ICD-10-CM

## 2022-11-23 DIAGNOSIS — R41841 Cognitive communication deficit: Secondary | ICD-10-CM

## 2022-11-23 NOTE — Therapy (Signed)
OUTPATIENT SPEECH LANGUAGE PATHOLOGY APHASIA TREATMENT   Patient Name: Walter [name] MRN: [id number] DOB:09/12/1940, 82 y.o., male Today's Date: 11/23/2022  PCP: Shelva Majestic., MD REFERRING PROVIDER: Marlowe Kays, PA-C  END OF SESSION:  End of Session - 11/23/22 1428     Visit Number 8    Number of Visits 13    Date for SLP Re-Evaluation 12/09/22   date is out two weeks due to pt on vacation for two weeks   SLP Start Time 1403    SLP Stop Time  1437    SLP Time Calculation (min) 34 min    Activity Tolerance Patient tolerated treatment well                  Past Medical History:  Diagnosis Date   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   S/p 2nd ray amputation 2018- started after prolonged use of corn pad   Acute upper GI bleeding 01/26/2022   Allergic rhinitis 08/19/2019   flonase   Anemia    BPH (benign prostatic hyperplasia) 07/02/2015   S/p TURP with multiple complications afterwards including recurrent hospitalizations. All started after a hemorrhoid surgery and later with urinary retention. Patient at one point was septic from urinary issues.     Diabetes mellitus type II, controlled 04/23/2007    Metformin 1g BID, amaryl 4mg --> 8mg , had to add [place] back  Never took victoza.   Januvia-lethargic and dizzy in past. 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 Follows with Dr. 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   Pulmonary nodule 02/04/2022   Sepsis secondary to UTI 03/29/2015   Past Surgical History:  Procedure Laterality Date   AMPUTATION TOE Right 09/14/2016   Procedure: RIGHT 2ND TOE/RAY AMPUTATION;  Surgeon: Tarry Kos, MD;  Location: [facility];  Service: Orthopedics;  Laterality: Right;   APPENDECTOMY  05/03/1947   BIOPSY  01/28/2022   Procedure: BIOPSY;  Surgeon: Sherrilyn Rist, MD;  Location: [facility] ENDOSCOPY;  Service: Gastroenterology;;   COLECTOMY  05/02/1990   CYSTOSCOPY N/A 06/21/2015   Procedure: CYSTOSCOPY, CLOT EVACUATION, AND CAUTERIZATION OF PROSTATE FOSSA;  Surgeon: Jethro Bolus, MD;  Location: [facility];  Service: Urology;  Laterality: N/A;   ESOPHAGOGASTRODUODENOSCOPY (EGD) WITH PROPOFOL N/A 01/26/2022   Procedure: ESOPHAGOGASTRODUODENOSCOPY (EGD) WITH PROPOFOL;  Surgeon: Sherrilyn Rist, MD;  Location: [facility] ENDOSCOPY;  Service: Gastroenterology;  Laterality: N/A;   ESOPHAGOGASTRODUODENOSCOPY (EGD) WITH PROPOFOL N/A 01/28/2022   Procedure: ESOPHAGOGASTRODUODENOSCOPY (EGD) WITH PROPOFOL;  Surgeon: Sherrilyn Rist, MD;  Location:  [facility] ENDOSCOPY;  Service: Gastroenterology;  Laterality: N/A;   HEMORRHOID SURGERY     HEMOSTASIS CLIP PLACEMENT  01/26/2022   Procedure: HEMOSTASIS CLIP PLACEMENT;  Surgeon: Sherrilyn Rist, MD;  Location: [facility] ENDOSCOPY;  Service: Gastroenterology;;   HEMOSTASIS CONTROL  01/26/2022   Procedure: HEMOSTASIS CONTROL;  Surgeon: Sherrilyn Rist, MD;  Location: [facility] ENDOSCOPY;  Service: Gastroenterology;;   HOT HEMOSTASIS N/A 01/26/2022   Procedure: HOT HEMOSTASIS (ARGON PLASMA COAGULATION/BICAP);  Surgeon: Sherrilyn Rist, MD;  Location: [facility] ENDOSCOPY;  Service: Gastroenterology;  Laterality: N/A;   MOLE REMOVAL  2022   hand   SCLEROTHERAPY  01/26/2022   Procedure: SCLEROTHERAPY;  Surgeon: Sherrilyn Rist, MD;  Location: [facility] ENDOSCOPY;  Service: Gastroenterology;;   TOE SURGERY  04/2020   TRANSURETHRAL RESECTION OF PROSTATE N/A 05/23/2015   Procedure: TRANSURETHRAL RESECTION OF THE PROSTATE (TURP);  Surgeon: Bjorn Pippin, MD;  Location: [facility];  Service: Urology;  Laterality: N/A;   Patient Active Problem List   Diagnosis Date Noted   History of dysplastic nevus    History of skin cancer    Pulmonary nodule 02/04/2022   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   Duodenal ulcer hemorrhage    Mild neurocognitive disorder, concerns for Alzheimer's disease 12/23/2019   Allergic rhinitis 08/19/2019   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   BPH (benign prostatic hyperplasia) 07/02/2015   Hallux valgus of right foot 03/26/2014   History of colon cancer 01/27/2014   Iritis 12/17/2008   Postherpetic neuralgia 05/22/2008   Bigeminy 04/21/2008   Diabetes mellitus type II, controlled 04/23/2007   Hyperlipidemia associated with type 2 diabetes mellitus 04/23/2007   Obstructive sleep apnea 04/23/2007   Essential hypertension 04/23/2007    ONSET DATE: Chronic/gradual onset; Script 09/20/22   REFERRING DIAG:  R47.9 (ICD-10-CM) - Speech disturbance, unspecified type      THERAPY DIAG:  Aphasia  Cognitive communication deficit  Rationale for Evaluation and Treatment: Rehabilitation  SUBJECTIVE:   SUBJECTIVE STATEMENT: "You have helped me so much- when I have trouble with a word I just wait a bit and then it comes." Pt accompanied by: significant other : self  PERTINENT HISTORY: 82 y.o. RH male with a history of hypertension, hyperlipidemia, diabetes, sleep apnea on CPAP, colon cancer s/p resection, prior  history of H. 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   PAIN:  Are you having pain? No  FALLS: Has patient fallen in last 6 months?  No  PATIENT GOALS: Improve word finding  OBJECTIVE:   DIAGNOSTIC FINDINGS:  Neurocognitive testing 11/2020 Dr. [name] Briefly, results suggested noted performance variability surrounding executive functioning, semantic fluency, confrontation naming, and encoding (i.e., encoding) and retrieval aspects of memory. Performance was generally appropriate relative to age-matched peers across processing speed, receptive language, phonemic fluency, and visuospatial abilities. Relative to his evaluation one year prior, his most notable areas of decline surrounded executive functioning and attention/concentration. These were generally mild overall. Regarding etiology, there remain some concerns surrounding Alzheimer's disease based upon memory dysfunction, coupled with deficits in confrontation naming, semantic fluency, and executive functioning. As such, I cannot rule out this diagnosis. However, despite scores suggesting delayed memory falling in the exceptionally low normative range, he was able to demonstrate some appropriate retrieval/consolidation abilities, which would not suggest characteristic rapid forgetting. If Alzheimer's disease is indeed  the underlying pathology, it appears to be progressing slowly, likely aided by current medication intervention and regular physical/mental stimulation.   Pt has current script for MR Head -pending  MRI HEAD WITHOUT AND WITH CONTRAST 12/22/2019 COMPARISON:  None.  FINDINGS: Brain: No recent infarction, hemorrhage, hydrocephalus, extra-axial collection or mass lesion. Chronic small vessel ischemia in the cerebral white matter, mild for age. Cerebral volume loss without specific pattern, relatively mild for age. No chronic blood  products or abnormal mineralization. No abnormal enhancement. Vascular: Preserved flow voids. Tortuous basilar impressing on the pons. Skull and upper cervical spine: Normal marrow signal Sinuses/Orbits: Mucosal thickening mainly in ethmoid sinuses. IMPRESSION: 1. No reversible finding or specific explanation for symptoms. 2. Mild for age white matter disease and generalized volume loss.   PATIENT REPORTED OUTCOME MEASURES (PROM): Communication Effectiveness Survey: Pt answered this PROM with 31/32 (higher scores indicate more effective communication, and wife scored pt's communication effectiveness 24/32. SLP suspects pt's cognitive deficits may play a role in his reduced awareness of deficit or a reduced insight into his severity of deficit.    TODAY'S TREATMENT:                                                                                                                                         DATE:  11/23/22: Pt enters today and states that people/family they saw on vacation did not know that Beecher was having problems with memory or with word finding - stated they would not have known had he not told them. Pt had two anomic situations during this 34 minute session, where he used the strategy he just described to SLP, with success 2/2.  SLP and pt agreed that if pt's status remains unchanged for his next appointment that d/c is likely.  11/02/22: Pt reported that his word finding skills are improved, the last episode he recalls was Monday. SLP copied off "Top 10 Word Finding Strategies for Aphasia" and reviewed with pt, with examples provided. Pt had an episode with anomia ("frame") during the session and SLP encouraged pt to use "find it" strategy, which he did and had success.  Next session SLP to talk with pt and wife about what he would like to cont to work on in ST.    10/31/22: SLP reviewed communication strategies with pt from last session. PT did not recall using any of these strategies  since previous ST. SLP provided rationale for these strategies.  SLP then engaged pt in functional conversational therapy. As patient had difficulty with word finding and thought organization to express himself verbally, SLP cued patient to use compensations. The most effective was encouraging [place] to communicate "the big picture/idea". This occurred x4 today and pt did not always engage in this practice and req'd cues from SLP to do so.  SLP reiterated the rationale for this to patient, and patient agreed this is a good practice  to begin to use.  10/27/22:  Pt was seen for skilled ST services targeting communication strategies. Wife joined for today's session. SLP edu on supported conversation strategies re: getting attention before speaking, providing time for processing, providing choices vs. Open ended questions, checking for clarification, thinking of "big idea", topic changing using predetermined phrase. Pt reports these are extremely helpful and believes they can be immediately implemented at home. Pt reports he does NOT like to complete papers at home that are not "relevant" to him. SLP explained that HEP is provided to continue practicing tasks that are learned in therapy for better generalization. Pt reports if given papers that are not relevant, he likely won't do them. Wife confirms this makes him upset.   10/24/22: Target use of description as anomia strategy with use of Semantic Feature Analysis as framework. SLP provides education regarding rationale and purpose, models with simple target "apple." Following, pt able to generate 5/6 features of x5 targets with usual mod-A cues from SLP. Pt benefiting from questioning, semantic, and phonemic cues to aid in completion of activity.   10/20/22: Pt went to Dr. [name] for a call-list appointment yesterday for neuropsych testing. Today pt handed back homework (similarity/difference) with initial following of directions but after completion of 1/4 of the  stimuli pt began to only indicate differences. When questioned about this pt stated the instructions were to put differences and said "I don't think I read the instructions." SLP reminded pt he began task correctly and pt corrected himself. "I don't know why I did that." SLP reviewed this paper with pt and by the third stimuli, pt had reverted to only telling differences, and SLP had to cue pt occasionally faded to rarely to also tell similarity for each set of words.  Pt told SLP when he was d/c'd he had many more meds and he worked out a schedule for them, with wife's help. SLP questions amount of wife's assistance for this task, given how pt is performing in ST with alternating attention/memory.   10/17/22: Vernona Rieger has not been able to cue pt as much at home due to being unsure of the word pt is struggling to generate. Pt could not generate "Karin Golden" or "[place]" in explanation of where his [place] is where he meets "the guys". Wife assisted pt with anomia correctly once, and provided the first letter of anomic word x1. He described feelings for anomia in "s", above re: [place] but expressed frustration about anomia with Karin Golden. SLP suggested pt write down 5 sentences with Karin Golden in them and write/read/listen to the sentences.  SLP provided memory strategies and will go over these with pt next session, in order to improve the flow of conversation/communication at home.   10/10/22 (eval): SLP discussed eval results and home tasks for pt to engage in with wife's help if necessary (5 sentences out of anomic word, once discovered). SLP also discussed how to assist pt at home (phonemic cues if wife knew what word pt was attempting to say).  PATIENT EDUCATION: Education details: see "today's treatment" Person educated: Patient and Spouse Education method: Explanation, Demonstration, and Verbal cues Education comprehension: verbalized understanding, verbal cues required, and needs further  education   GOALS: Goals reviewed with patient? Yes  SHORT TERM GOALS: Target date: 11/08/22  Pt will tell SLP 4 strategies for word finding in two sessions Baseline: Goal status: MET  2.  Pt will demo [facility] word finding in 10 minutes simple conversation using compensations in 3  sessions Baseline: 11/02/22, 11/23/22 Goal status: INITIAL  3.  Pt will name 8 items in a simple category with rare min A in 2 sessions Baseline:  Goal status: Deferred - pt progress  4.  Pt and wife will demo modified independence with SFA and VNEST Baseline:  Goal status: Partially met - pt does not prefer formal homework   LONG TERM GOALS: Target date: 12/09/22  Pt will demo [facility] word finding in 10 minutes simple-mod complex conversation using compensations in 3 sessions Baseline: 11/23/22 Goal status: INITIAL  2.  Pt will report to SLP he is using at least 2 memory strategies at home between 3 sessions, or use a memory strategy in 2 sessions  Baseline:  Goal status: INITIAL  3.  Pt will report less frustration associated with anomia than initially prior to ST Baseline:  Goal status: INITIAL  4.  Pt will score higher when administered PROM in the last 1-2 sessions compared to initial administration Baseline:  Goal status: INITIAL  ASSESSMENT:  CLINICAL IMPRESSION: Patient is a 82 y.o. M who was seen today for cognitive-communication in light of questionable etiology. See tx note. Pt does not wish to add cognitive goals to current plan of care. Likely d/c next session.  OBJECTIVE IMPAIRMENTS: include attention, executive functioning, and aphasia. These impairments are limiting patient from household responsibilities, ADLs/IADLs, and effectively communicating at home and in community. Factors affecting potential to achieve goals and functional outcome are  none toda . Patient will benefit from skilled SLP services to address above impairments and improve overall function.  REHAB POTENTIAL: Fair  depends upon diagnosis; pt agrees that SLP can do some things with him to explore feasibility of direct language therapy asissting pt's expressive language.  PLAN:  SLP FREQUENCY: 2x/week  SLP DURATION: 6 weeks  PLANNED INTERVENTIONS: Language facilitation, Environmental controls, Cueing hierachy, Cognitive reorganization, Internal/external aids, Functional tasks, Multimodal communication approach, SLP instruction and feedback, Compensatory strategies, and Patient/family education    [facility], CCC-SLP 11/23/2022, 2:39 PM

## 2022-11-28 ENCOUNTER — Ambulatory Visit: Payer: Medicare Other

## 2022-11-28 ENCOUNTER — Ambulatory Visit: Payer: Medicare Other | Admitting: Podiatry

## 2022-11-28 DIAGNOSIS — R41841 Cognitive communication deficit: Secondary | ICD-10-CM

## 2022-11-28 DIAGNOSIS — M79675 Pain in left toe(s): Secondary | ICD-10-CM

## 2022-11-28 DIAGNOSIS — E1149 Type 2 diabetes mellitus with other diabetic neurological complication: Secondary | ICD-10-CM | POA: Diagnosis not present

## 2022-11-28 DIAGNOSIS — R4701 Aphasia: Secondary | ICD-10-CM | POA: Diagnosis not present

## 2022-11-28 DIAGNOSIS — B351 Tinea unguium: Secondary | ICD-10-CM | POA: Diagnosis not present

## 2022-11-28 DIAGNOSIS — M79674 Pain in right toe(s): Secondary | ICD-10-CM

## 2022-11-28 NOTE — Progress Notes (Signed)
Subjective: Chief Complaint  Patient presents with   diabetic foot care     82 year old male presents the office today with above concerns.  Denies any open sores. No other concerns today.  Last A1c was 7.8 on 08/23/2022  Shelva Majestic, MD Last seen 10/06/2022   Objective: AAO x3, NAD DP/PT pulses palpable bilaterally, CRT less than 3 seconds Nails are hypertrophic, dystrophic, brittle, discolored, elongated 9. No surrounding redness or drainage. 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 No open lesions or pre-ulcerative lesions are identified today. No pain with calf compression, swelling, warmth, erythema  Assessment: 82 year old male with symptomatic onychomycosis  Plan: -All treatment options discussed with the patient including all alternatives, risks, complications.  -Sharply debrided the nails x 9 without any complications or bleeding. -Daily foot inspection. -Patient encouraged to call the office with any questions, concerns, change in symptoms.   Return in about 3 months (around 02/28/2023) for routine care and diabetic shoe measurment .   Vivi Barrack DPM

## 2022-11-28 NOTE — Therapy (Signed)
OUTPATIENT SPEECH LANGUAGE PATHOLOGY APHASIA TREATMENT/DISCHARGE   Patient Name: Camdynn Aldis MRN: [id number] DOB:[birth year], 82 y.o., male Today's Date: 11/28/2022  PCP: Shelva Majestic., MD REFERRING PROVIDER: Marlowe Kays, PA-C  END OF SESSION:  End of Session - 11/28/22 1508     Visit Number 9    Number of Visits 13    Date for SLP Re-Evaluation 12/09/22   date is out two weeks due to pt on vacation for two weeks   SLP Start Time 1406    SLP Stop Time  1435    SLP Time Calculation (min) 29 min    Activity Tolerance Patient tolerated treatment well                   Past Medical History:  Diagnosis Date   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   S/p 2nd ray amputation 2018- started after prolonged use of corn pad   Acute upper GI bleeding 01/26/2022   Allergic rhinitis 08/19/2019   flonase   Anemia    BPH (benign prostatic hyperplasia) 07/02/2015   S/p TURP with multiple complications afterwards including recurrent hospitalizations. All started after a hemorrhoid surgery and later with urinary retention. Patient at one point was septic from urinary issues.     Diabetes mellitus type II, controlled 04/23/2007    Metformin 1g BID, amaryl 4mg --> 8mg , had to add [place] back  Never took victoza.   Januvia-lethargic and dizzy in past. 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 Follows with Dr. 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   Pulmonary nodule 02/04/2022   Sepsis secondary to UTI 03/29/2015   Past Surgical History:  Procedure Laterality Date   AMPUTATION TOE Right 09/14/2016   Procedure: RIGHT 2ND TOE/RAY AMPUTATION;  Surgeon: Tarry Kos, MD;  Location: [facility];  Service: Orthopedics;  Laterality: Right;   APPENDECTOMY  05/03/1947   BIOPSY  01/28/2022   Procedure: BIOPSY;  Surgeon: Sherrilyn Rist, MD;  Location[facility] END[facility]COPY;  Service: Gastroenterology;;   COLECTOMY  05/02/1990   CYSTOSCOPY N/A 06/21/2015   Procedure: CYSTOSCOPY, CLOT EVACUATION, AND CAUTERIZATION OF PROSTATE FOSSA;  Surgeon: Jethro Bolus, MD;  Location: [facility];  Service: Urology;  Laterality: N/A;   ESOPHAGOGASTRODUODENOSCOPY (EGD) WITH PROPOFOL N/A 01/26/2022   Procedure: ESOPHAGOGASTRODUODENOSCOPY (EGD) WITH PROPOFOL;  Surgeon: Sherrilyn Rist, MD;  Location: [facility] ENDOSCOPY;  Service: Gastroenterology;  Laterality: N/A;   ESOPHAGOGASTRODUODENOSCOPY (EGD) WITH PROPOFOL N/A 01/28/2022   Procedure: ESOPHAGOGASTRODUODENOSCOPY (EGD) WITH PROPOFOL;  Surgeon: Sherrilyn Rist, MD;  Location: [facility] ENDOSCOPY;  Service: Gastroenterology;  Laterality: N/A;   HEMORRHOID SURGERY     HEMOSTASIS CLIP PLACEMENT  01/26/2022   Procedure: HEMOSTASIS CLIP PLACEMENT;  Surgeon: Sherrilyn Rist, MD;  Location: [facility] ENDOSCOPY;  Service: Gastroenterology;;   HEMOSTASIS CONTROL  01/26/2022   Procedure: HEMOSTASIS CONTROL;  Surgeon: Sherrilyn Rist, MD;  Location: [facility] ENDOSCOPY;  Service: Gastroenterology;;   HOT HEMOSTASIS N/A 01/26/2022   Procedure: HOT HEMOSTASIS (ARGON PLASMA COAGULATION/BICAP);   Surgeon: Sherrilyn Rist, MD;  Location: [facility] ENDOSCOPY;  Service: Gastroenterology;  Laterality: N/A;   MOLE REMOVAL  2022   hand   SCLEROTHERAPY  01/26/2022   Procedure: SCLEROTHERAPY;  Surgeon: Sherrilyn Rist, MD;  Location: [facility] ENDOSCOPY;  Service: Gastroenterology;;   TOE SURGERY  04/2020   TRANSURETHRAL RESECTION OF PROSTATE N/A 05/23/2015   Procedure: TRANSURETHRAL RESECTION OF THE PROSTATE (TURP);  Surgeon: Bjorn Pippin, MD;  Location: [facility];  Service: Urology;  Laterality: N/A;   Patient Active Problem List   Diagnosis Date Noted   History of dysplastic nevus    History of skin cancer    Pulmonary nodule 02/04/2022   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   Duodenal ulcer hemorrhage    Mild neurocognitive disorder, concerns for Alzheimer's disease 12/23/2019   Allergic rhinitis 08/19/2019   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   BPH (benign prostatic hyperplasia) 07/02/2015   Hallux valgus of right foot 03/26/2014   History of colon cancer 01/27/2014   Iritis 12/17/2008   Postherpetic neuralgia 05/22/2008   Bigeminy 04/21/2008   Diabetes mellitus type II, controlled 04/23/2007   Hyperlipidemia associated with type 2 diabetes mellitus 04/23/2007   Obstructive sleep apnea 04/23/2007   Essential hypertension 04/23/2007   SPEECH THERAPY DISCHARGE SUMMARY  Visits from Start of Care: 9  Current functional level related to goals / functional outcomes: Pt reports he is not frustrated with his speech like he was prior to ST; he is using compensatory measures for aphasia currently in conversation, and reports he is using memory strategies at home PRN.   Remaining deficits: Anomia, memory deficits.   Education / Equipment: Anomia and memory compensations.   Patient agrees to discharge. Patient goals were partially met. Patient is being discharged due to being pleased with the current  functional level..      ONSET DATE: Chronic/gradual onset; Script 09/20/22   REFERRING DIAG:  R47.9 (ICD-10-CM) - Speech disturbance, unspecified type      THERAPY DIAG:  Aphasia  Cognitive communication deficit  Rationale for Evaluation and Treatment: Rehabilitation  SUBJECTIVE:   SUBJECTIVE STATEMENT: 'I've been doing a little stuff at the club. One of the guys said to me "You're not hesitating as much." ' Pt accompanied by:    self  PERTINENT HISTORY: 82 y.o. RH male with a history of hypertension, hyperlipidemia, diabetes, sleep apnea on CPAP, colon cancer s/p resection, prior history of H. 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   PAIN:  Are you having pain? No  FALLS: Has patient fallen in last 6 months?  No  PATIENT GOALS: Improve word finding  OBJECTIVE:   DIAGNOSTIC FINDINGS:  Neurocognitive testing 11/2020 Dr. [name] Briefly, results suggested noted performance variability surrounding executive functioning, semantic fluency, confrontation naming, and encoding (i.e., encoding) and retrieval aspects of memory. Performance was generally appropriate relative to  age-matched peers across processing speed, receptive language, phonemic fluency, and visuospatial abilities. Relative to his evaluation one year prior, his most notable areas of decline surrounded executive functioning and attention/concentration. These were generally mild overall. Regarding etiology, there remain some concerns surrounding Alzheimer's disease based upon memory dysfunction, coupled with deficits in confrontation naming, semantic fluency, and executive functioning. As such, I cannot rule out this diagnosis. However, despite scores suggesting delayed memory falling in the exceptionally low normative range, he was able to demonstrate some appropriate retrieval/consolidation abilities, which would not suggest  characteristic rapid forgetting. If Alzheimer's disease is indeed the underlying pathology, it appears to be progressing slowly, likely aided by current medication intervention and regular physical/mental stimulation.   Pt has current script for MR Head -pending  MRI HEAD WITHOUT AND WITH CONTRAST 12/22/2019 COMPARISON:  None.  FINDINGS: Brain: No recent infarction, hemorrhage, hydrocephalus, extra-axial collection or mass lesion. Chronic small vessel ischemia in the cerebral white matter, mild for age. Cerebral volume loss without specific pattern, relatively mild for age. No chronic blood products or abnormal mineralization. No abnormal enhancement. Vascular: Preserved flow voids. Tortuous basilar impressing on the pons. Skull and upper cervical spine: Normal marrow signal Sinuses/Orbits: Mucosal thickening mainly in ethmoid sinuses. IMPRESSION: 1. No reversible finding or specific explanation for symptoms. 2. Mild for age white matter disease and generalized volume loss.   PATIENT REPORTED OUTCOME MEASURES (PROM): Communication Effectiveness Survey: Pt answered this PROM with 31/32 (higher scores indicate more effective communication, and wife scored pt's communication effectiveness 24/32. SLP suspects pt's cognitive deficits may play a role in his reduced awareness of deficit or a reduced insight into his severity of deficit.    TODAY'S TREATMENT:                                                                                                                                         DATE:  11/28/22: "(My talking) is much more fluent than it was," pt reported to SLP. Pt does not report any more difficulty with memory than in previous session and tells SLP he is using memory strategies such as reviewing written information, using a calendar, and other notes. Today in 14 minutes of simple-mod complex conversation with min extra time (1-2 seconds) Cyrill was able to utilize anomia compensations  PRN x2. He also reports less frustration with communication than prior to ST. He is comfortable with d/c today. CES was completed today with the same score (31/32).   11/23/22: Pt enters today and states that people/family they saw on vacation did not know that Gid was having problems with memory or with word finding - stated they would not have known had he not told them. Pt had two anomic situations during this 34 minute session, where he used the strategy he just described to SLP, with success 2/2.  SLP and pt agreed that if pt's status remains unchanged for  his next appointment that d/c is likely.  11/02/22: Pt reported that his word finding skills are improved, the last episode he recalls was Monday. SLP copied off "Top 10 Word Finding Strategies for Aphasia" and reviewed with pt, with examples provided. Pt had an episode with anomia ("frame") during the session and SLP encouraged pt to use "find it" strategy, which he did and had success.  Next session SLP to talk with pt and wife about what he would like to cont to work on in ST.    10/31/22: SLP reviewed communication strategies with pt from last session. PT did not recall using any of these strategies since previous ST. SLP provided rationale for these strategies.  SLP then engaged pt in functional conversational therapy. As patient had difficulty with word finding and thought organization to express himself verbally, SLP cued patient to use compensations. The most effective was encouraging Dariel to communicate "the big picture/idea". This occurred x4 today and pt did not always engage in this practice and req'd cues from SLP to do so.  SLP reiterated the rationale for this to patient, and patient agreed this is a good practice to begin to use.  10/27/22:  Pt was seen for skilled ST services targeting communication strategies. Wife joined for today's session. SLP edu on supported conversation strategies re: getting attention before speaking, providing  time for processing, providing choices vs. Open ended questions, checking for clarification, thinking of "big idea", topic changing using predetermined phrase. Pt reports these are extremely helpful and believes they can be immediately implemented at home. Pt reports he does NOT like to complete papers at home that are not "relevant" to him. SLP explained that HEP is provided to continue practicing tasks that are learned in therapy for better generalization. Pt reports if given papers that are not relevant, he likely won't do them. Wife confirms this makes him upset.   10/24/22: Target use of description as anomia strategy with use of Semantic Feature Analysis as framework. SLP provides education regarding rationale and purpose, models with simple target "apple." Following, pt able to generate 5/6 features of x5 targets with usual mod-A cues from SLP. Pt benefiting from questioning, semantic, and phonemic cues to aid in completion of activity.   10/20/22: Pt went to Dr. [name] for a call-list appointment yesterday for neuropsych testing. Today pt handed back homework (similarity/difference) with initial following of directions but after completion of 1/4 of the stimuli pt began to only indicate differences. When questioned about this pt stated the instructions were to put differences and said "I don't think I read the instructions." SLP reminded pt he began task correctly and pt corrected himself. "I don't know why I did that." SLP reviewed this paper with pt and by the third stimuli, pt had reverted to only telling differences, and SLP had to cue pt occasionally faded to rarely to also tell similarity for each set of words.  Pt told SLP when he was d/c'd he had many more meds and he worked out a schedule for them, with wife's help. SLP questions amount of wife's assistance for this task, given how pt is performing in ST with alternating attention/memory.   10/17/22: Vernona Rieger has not been able to cue pt as much at  home due to being unsure of the word pt is struggling to generate. Pt could not generate "Karin Golden" or "[place]" in explanation of where his [place] is where he meets "the guys". Wife assisted pt with anomia correctly once, and  provided the first letter of anomic word x1. He described feelings for anomia in "s", above re: [place] but expressed frustration about anomia with Karin Golden. SLP suggested pt write down 5 sentences with Karin Golden in them and write/read/listen to the sentences.  SLP provided memory strategies and will go over these with pt next session, in order to improve the flow of conversation/communication at home.   10/10/22 (eval): SLP discussed eval results and home tasks for pt to engage in with wife's help if necessary (5 sentences out of anomic word, once discovered). SLP also discussed how to assist pt at home (phonemic cues if wife knew what word pt was attempting to say).  PATIENT EDUCATION: Education details: see "today's treatment" Person educated: Patient and Spouse Education method: Explanation, Demonstration, and Verbal cues Education comprehension: verbalized understanding, verbal cues required, and needs further education   GOALS: Goals reviewed with patient? Yes  SHORT TERM GOALS: Target date: 11/08/22  Pt will tell SLP 4 strategies for word finding in two sessions Baseline: Goal status: MET  2.  Pt will demo [facility] word finding in 10 minutes simple conversation using compensations in 3 sessions Baseline: 11/02/22, 11/23/22 Goal status: MET  3.  Pt will name 8 items in a simple category with rare min A in 2 sessions Baseline:  Goal status: Deferred - pt progress  4.  Pt and wife will demo modified independence with SFA and VNEST Baseline:  Goal status: Partially met - pt does not prefer formal homework   LONG TERM GOALS: Target date: 12/09/22  Pt will demo [facility] word finding in 10 minutes simple-mod complex conversation using compensations in 3  sessions Baseline: 11/23/22, 11/28/22 Goal status: Partially Met  2.  Pt will report to SLP he is using at least 2 memory strategies at home between 3 sessions, or use a memory strategy in 2 sessions  Baseline: 11/28/22 Goal status: Partially Met  3.  Pt will report less frustration associated with anomia than initially prior to ST Baseline:  Goal status: Met  4.  Pt will score higher when administered PROM in the last 1-2 sessions compared to initial administration Baseline:  Goal status: Not met (same response as initial)  ASSESSMENT:  CLINICAL IMPRESSION: Patient is a 82 y.o. M who was seen today for cognitive-communication in light of questionable etiology. See tx note. Pt does not wish to add cognitive goals to current plan of care. He reports improved communication skills and successful use of memory strategies. Pt is comfortable with and agrees with d/c today.  OBJECTIVE IMPAIRMENTS: include attention, executive functioning, and aphasia. These impairments are limiting patient from household responsibilities, ADLs/IADLs, and effectively communicating at home and in community. Factors affecting potential to achieve goals and functional outcome are  none toda . Patient will benefit from skilled SLP services to address above impairments and improve overall function.  REHAB POTENTIAL: Fair depends upon diagnosis; pt agrees that SLP can do some things with him to explore feasibility of direct language therapy asissting pt's expressive language.  PLAN: Discharge today.  PLANNED INTERVENTIONS: Language facilitation, Environmental controls, Cueing hierachy, Cognitive reorganization, Internal/external aids, Functional tasks, Multimodal communication approach, SLP instruction and feedback, Compensatory strategies, and Patient/family education    [facility], CCC-SLP 11/28/2022, 3:08 PM

## 2022-11-29 ENCOUNTER — Ambulatory Visit: Payer: Medicare Other | Admitting: Behavioral Health

## 2022-11-30 ENCOUNTER — Ambulatory Visit: Payer: Medicare Other

## 2022-11-30 ENCOUNTER — Encounter (INDEPENDENT_AMBULATORY_CARE_PROVIDER_SITE_OTHER): Payer: Self-pay

## 2022-12-01 ENCOUNTER — Other Ambulatory Visit: Payer: Medicare Other

## 2022-12-02 ENCOUNTER — Ambulatory Visit: Payer: Medicare Other

## 2022-12-05 NOTE — Progress Notes (Signed)
Patient presents to the office today for diabetic shoe and insole measuring.  Patient was measured with brannock device to determine size and width for 1 pair of extra depth shoes and foam casted for 3 pair of insoles.   Documentation of medical necessity will be sent to patient's treating diabetic doctor to verify and sign.   Patient's diabetic provider: Netta Corrigan MD  Shoes and insoles will be ordered at that time and patient will be notified for an appointment for fitting when they arrive.   Shoe size (per patient): 11 Brannock measurement: 11.5  Patient shoe selection-   Shoe choice:   [id number] 2nd choice [id number]  Shoe size ordered: 11.5WD

## 2022-12-09 ENCOUNTER — Ambulatory Visit: Payer: Medicare Other | Admitting: Pulmonary Disease

## 2022-12-09 ENCOUNTER — Encounter: Payer: Self-pay | Admitting: Pulmonary Disease

## 2022-12-09 VITALS — BP 126/68 | HR 70 | Ht 72.0 in | Wt 191.4 lb

## 2022-12-09 DIAGNOSIS — G4733 Obstructive sleep apnea (adult) (pediatric): Secondary | ICD-10-CM

## 2022-12-09 NOTE — Patient Instructions (Signed)
Mild obstructive sleep apnea with excellent compliance Machine shows excellent treatment  Continue using your CPAP nightly  I will see you a year from now  Call us with any significant concerns  Upgrading your machine should be straightforward, only requires of sending a prescription to the medical supply company  Encourage regular exercise

## 2022-12-09 NOTE — Progress Notes (Signed)
Walter [name]    [id number]    [birth year]  Primary Care Physician:Hunter, Aldine Contes, MD  Referring Physician: Shelva Majestic, MD [address] [place],  [place] [address]  Chief complaint:   History of obstructive sleep apnea In for follow-up HPI:  No significant changes in his health  Continues to be very active  Uses CPAP nightly  Obstructive sleep apnea diagnosed about 21 years ago Diagnosed with mild obstructive sleep apnea  Has been using his machine since September 2022  Wakes up feeling like he is at a good nights rest Continues to benefit from CPAP use  Continues to benefit from using CPAP on a regular basis  He has hypertension, diabetes Was treated for colon cancer 89  He feels well  Spouse is concerned that his sleep apnea is possibly suboptimally treated at present  Outpatient Encounter Medications as of 12/09/2022  Medication Sig   amLODipine (NORVASC) 5 MG tablet TAKE 1 TABLET BY MOUTH EVERY DAY (Patient taking differently: Take 5 mg by mouth daily.)   [employer] STOMACH RELIEF 262 MG chewable tablet Chew 2 tablets by mouth 4 (four) times daily.   donepezil (ARICEPT) 10 MG tablet TAKE 1 TABLET BY MOUTH EVERYDAY AT BEDTIME   glipiZIDE (GLUCOTROL) 10 MG tablet TAKE 1 TABLET (10 MG TOTAL) BY MOUTH TWICE A DAY BEFORE A MEAL   Glucagon (GVOKE HYPOPEN 2-PACK) 0.5 MG/0.1ML SOAJ Inject 0.5 mg into the skin daily as needed (for low blood sugar).   Lutein 20 MG TABS Take 20 mg by mouth daily.   metFORMIN (GLUCOPHAGE) 1000 MG tablet TAKE 1 TABLET BY MOUTH 2 TIMES DAILY WITH A MEAL. (Patient taking differently: Take 1,000 mg by mouth 2 (two) times daily with a meal.)   Multiple Vitamins-Minerals (CENTRUM SILVER PO) Take 1 tablet by mouth daily.    ONETOUCH ULTRA test strip USE ONCE DAILY AS INSTRUCTED   rosuvastatin (CRESTOR) 20 MG tablet TAKE 1 TABLET BY MOUTH ONE TIME PER WEEK   sitaGLIPtin (JANUVIA) 100 MG tablet Take 1 tablet (100 mg total) by mouth daily.    telmisartan (MICARDIS) 80 MG tablet Take 1 tablet (80 mg total) by mouth daily.   VITAMIN A PO Take 1 tablet by mouth daily.   ferrous sulfate 325 (65 FE) MG tablet Take 1 tablet (325 mg total) by mouth 2 (two) times daily with a meal. (Patient not taking: Reported on 10/10/2022)   pantoprazole (PROTONIX) 40 MG tablet Take 1 tablet (40 mg total) by mouth 2 (two) times daily.   No facility-administered encounter medications on file as of 12/09/2022.    Allergies as of 12/09/2022 - Review Complete 12/09/2022  Allergen Reaction Noted   Bactrim [sulfamethoxazole-trimethoprim] Nausea And Vomiting 01/11/2016    Past Medical History:  Diagnosis Date   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   S/p 2nd ray amputation 2018- started after prolonged use of corn pad   Acute upper GI bleeding 01/26/2022   Allergic rhinitis 08/19/2019   flonase   Anemia    BPH (benign prostatic hyperplasia) 07/02/2015   S/p TURP with multiple complications afterwards including recurrent hospitalizations. All started after a hemorrhoid surgery and later with urinary retention. Patient at one point was septic from urinary issues.     Diabetes mellitus type II, controlled 04/23/2007    Metformin 1g BID, amaryl 4mg --> 8mg , had to add [place] back  Never took victoza.   Januvia-lethargic and dizzy in past. 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 Follows with Dr. 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   Pulmonary nodule 02/04/2022   Sepsis secondary to UTI 03/29/2015    Past Surgical History:  Procedure Laterality Date   AMPUTATION TOE Right 09/14/2016   Procedure: RIGHT 2ND TOE/RAY AMPUTATION;  Surgeon: Tarry Kos, MD;  Location: [facility];  Service: Orthopedics;  Laterality: Right;   APPENDECTOMY  05/03/1947   BIOPSY  01/28/2022   Procedure: BIOPSY;  Surgeon: Sherrilyn Rist, MD;  Location: [facility] ENDOSCOPY;  Service: Gastroenterology;;   COLECTOMY  05/02/1990   CYSTOSCOPY N/A 06/21/2015   Procedure: CYSTOSCOPY, CLOT EVACUATION, AND CAUTERIZATION OF PROSTATE FOSSA;  Surgeon: Jethro Bolus, MD;  Location: [facility];  Service: Urology;  Laterality: N/A;   ESOPHAGOGASTRODUODENOSCOPY (EGD) WITH PROPOFOL N/A 01/26/2022   Procedure: ESOPHAGOGASTRODUODENOSCOPY (EGD) WITH PROPOFOL;  Surgeon: Sherrilyn Rist, MD;  Location: [facility] ENDOSCOPY;  Service: Gastroenterology;  Laterality: N/A;   ESOPHAGOGASTRODUODENOSCOPY (EGD) WITH PROPOFOL N/A 01/28/2022   Procedure: ESOPHAGOGASTRODUODENOSCOPY (EGD) WITH PROPOFOL;  Surgeon: Sherrilyn Rist, MD;  Location: [facility] ENDOSCOPY;  Service: Gastroenterology;  Laterality: N/A;   HEMORRHOID SURGERY     HEMOSTASIS CLIP PLACEMENT  01/26/2022   Procedure: HEMOSTASIS CLIP PLACEMENT;  Surgeon: Sherrilyn Rist, MD;  Location: [facility] ENDOSCOPY;  Service: Gastroenterology;;   HEMOSTASIS CONTROL  01/26/2022   Procedure: HEMOSTASIS CONTROL;  Surgeon: Sherrilyn Rist, MD;  Location: [facility] ENDOSCOPY;  Service: Gastroenterology;;   HOT HEMOSTASIS N/A 01/26/2022   Procedure: HOT HEMOSTASIS (ARGON  PLASMA COAGULATION/BICAP);  Surgeon: Sherrilyn Rist, MD;  Location: [facility] ENDOSCOPY;  Service: Gastroenterology;  Laterality: N/A;   MOLE REMOVAL  2022   hand   SCLEROTHERAPY  01/26/2022   Procedure: SCLEROTHERAPY;  Surgeon: Sherrilyn Rist, MD;  Location: [facility] ENDOSCOPY;  Service: Gastroenterology;;   TOE SURGERY  04/2020   TRANSURETHRAL RESECTION OF PROSTATE N/A 05/23/2015   Procedure: TRANSURETHRAL RESECTION OF THE PROSTATE (TURP);  Surgeon: Bjorn Pippin, MD;  Location: [facility];  Service: Urology;  Laterality: N/A;    Family History  Problem Relation Age of Onset   Alzheimer's disease Mother    Colon cancer Father 11   Diabetes Father        type I   Colon cancer Paternal Grandmother 74   Rectal cancer Maternal Uncle    Cancer Other        colon/fhx   Esophageal cancer Neg Hx    Stomach cancer Neg Hx     Social History  Socioeconomic History   Marital status: Married    Spouse name: Not on file   Number of children: 2   Years of education: 25   Highest education level: Master's degree (e.g., MA, MS, MEng, MEd, MSW, MBA)  Occupational History   Occupation: Retired  Tobacco Use   Smoking status: Former    Current packs/day: 0.25    Average packs/day: 0.3 packs/day for 10.0 years (2.5 ttl pk-yrs)    Types: Pipe, Cigarettes   Smokeless tobacco: Never  Vaping Use   Vaping status: Never Used  Substance and Sexual Activity   Alcohol use: Yes    Alcohol/week: 2.0 standard drinks of alcohol    Types: 2 Cans of beer per week    Comment: social   Drug use: No   Sexual activity: Not Currently  Other Topics Concern   Not on file  Social History Narrative   Married 1966. 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       Retired 2001-[employer]      Hobbies: hunting, fishing, walking, Geneticist, molecular (600-700 rounds per week)      No  religious beliefs affecting health care, no afterlife beliefs      Right Handed      Two Story Home   Social Determinants of Health   Financial Resource Strain: Low Risk  (05/12/2022)   Overall Financial Resource Strain (CARDIA)    Difficulty of Paying Living Expenses: Not hard at all  Food Insecurity: No Food Insecurity (05/12/2022)   Hunger Vital Sign    Worried About Running Out of Food in the Last Year: Never true    Ran Out of Food in the Last Year: Never true  Transportation Needs: No Transportation Needs (05/12/2022)   PRAPARE - Administrator, Civil Service (Medical): No    Lack of Transportation (Non-Medical): No  Physical Activity: Sufficiently Active (05/12/2022)   Exercise Vital Sign    Days of Exercise per Week: 3 days    Minutes of Exercise per Session: 60 min  Stress: No Stress Concern Present (05/12/2022)   [employer] of Occupational Health - Occupational Stress Questionnaire    Feeling of Stress : Not at all  Social Connections: Moderately Integrated (05/12/2022)   Social Connection and Isolation Panel [NHANES]    Frequency of Communication with Friends and Family: Three times a week    Frequency of Social Gatherings with Friends and Family: Three times a week    Attends Religious Services: Never    Active Member of Clubs or Organizations: Yes    Attends Banker Meetings: 1 to 4 times per year    Marital Status: Married  Catering manager Violence: Not At Risk (05/12/2022)   Humiliation, Afraid, Rape, and Kick questionnaire    Fear of Current or Ex-Partner: No    Emotionally Abused: No    Physically Abused: No    Sexually Abused: No    Review of Systems  Respiratory:  Positive for apnea.   Psychiatric/Behavioral:  Positive for sleep disturbance.   All other systems reviewed and are negative.   Vitals:   12/09/22 0859  BP: 126/68  Pulse: 70  SpO2: 98%     Physical Exam Constitutional:      Appearance: Normal appearance.   HENT:     Head: Normocephalic.     Nose: Nose normal.  Mouth/Throat:     Mouth: Mucous membranes are moist.  Eyes:     General:        Right eye: No discharge.        Left eye: No discharge.  Cardiovascular:     Rate and Rhythm: Normal rate and regular rhythm.     Pulses: Normal pulses.     Heart sounds: Normal heart sounds. No murmur heard.    No friction rub.  Pulmonary:     Effort: Pulmonary effort is normal. No respiratory distress.     Breath sounds: Normal breath sounds. No stridor. No wheezing or rhonchi.  Musculoskeletal:     Cervical back: No rigidity or tenderness.  Neurological:     Mental Status: He is alert.  Psychiatric:        Mood and Affect: Mood normal.    Compliance shows 99% compliance Average use of 6 hours 56 minutes AutoSet 5-15 Residual AHI 1.7  Assessment:  Mild obstructive sleep apnea -We will 12 CPAP therapy -Compliance continues to be great  His health has been stable   Plan/Recommendations: Follow-up a year from now  Encouraged to give [place] a call with any significant concerns  He will give [place] a call regarding machine upgrade at some point soon as well   Virl Diamond MD [facility] 12/09/2022, 9:16 AM  CC: Shelva Majestic, MD

## 2022-12-12 ENCOUNTER — Encounter: Payer: Medicare Other | Admitting: Psychology

## 2022-12-14 ENCOUNTER — Other Ambulatory Visit: Payer: Self-pay | Admitting: Neurology

## 2022-12-20 ENCOUNTER — Encounter: Payer: Medicare Other | Admitting: Psychology

## 2022-12-27 ENCOUNTER — Ambulatory Visit: Payer: Medicare Other | Admitting: Family Medicine

## 2022-12-27 ENCOUNTER — Other Ambulatory Visit: Payer: Self-pay

## 2022-12-27 ENCOUNTER — Encounter: Payer: Self-pay | Admitting: Family Medicine

## 2022-12-27 VITALS — BP 138/82 | HR 66 | Temp 98.2°F | Ht 72.0 in | Wt 190.2 lb

## 2022-12-27 DIAGNOSIS — R300 Dysuria: Secondary | ICD-10-CM | POA: Diagnosis not present

## 2022-12-27 DIAGNOSIS — G309 Alzheimer's disease, unspecified: Secondary | ICD-10-CM

## 2022-12-27 DIAGNOSIS — F0670 Mild neurocognitive disorder due to known physiological condition without behavioral disturbance: Secondary | ICD-10-CM

## 2022-12-27 DIAGNOSIS — I10 Essential (primary) hypertension: Secondary | ICD-10-CM

## 2022-12-27 DIAGNOSIS — E119 Type 2 diabetes mellitus without complications: Secondary | ICD-10-CM

## 2022-12-27 DIAGNOSIS — N401 Enlarged prostate with lower urinary tract symptoms: Secondary | ICD-10-CM

## 2022-12-27 DIAGNOSIS — R350 Frequency of micturition: Secondary | ICD-10-CM

## 2022-12-27 DIAGNOSIS — Z7984 Long term (current) use of oral hypoglycemic drugs: Secondary | ICD-10-CM

## 2022-12-27 DIAGNOSIS — R911 Solitary pulmonary nodule: Secondary | ICD-10-CM

## 2022-12-27 LAB — COMPREHENSIVE METABOLIC PANEL
ALT: 33 U/L (ref 0–53)
AST: 20 U/L (ref 0–37)
Albumin: 4.1 g/dL (ref 3.5–5.2)
Alkaline Phosphatase: 76 U/L (ref 39–117)
BUN: 17 mg/dL (ref 6–23)
CO2: 30 mEq/L (ref 19–32)
Calcium: 9.8 mg/dL (ref 8.4–10.5)
Chloride: 102 mEq/L (ref 96–112)
Creatinine, Ser: 0.94 mg/dL (ref 0.40–1.50)
GFR: 75.71 mL/min (ref >60.00)
Glucose, Bld: 165 mg/dL — ABNORMAL HIGH (ref 70–99)
Potassium: 4.3 mEq/L (ref 3.5–5.1)
Sodium: 140 mEq/L (ref 135–145)
Total Bilirubin: 0.6 mg/dL (ref 0.2–1.2)
Total Protein: 7 g/dL (ref 6.0–8.3)

## 2022-12-27 LAB — POC URINALSYSI DIPSTICK (AUTOMATED)
Bilirubin, UA: NEGATIVE
Blood, UA: NEGATIVE
Glucose, UA: NEGATIVE
Ketones, UA: POSITIVE
Nitrite, UA: NEGATIVE
Protein, UA: NEGATIVE
Spec Grav, UA: 1.02 (ref 1.010–1.025)
Urobilinogen, UA: 0.2 E.U./dL
pH, UA: 6 (ref 5.0–8.0)

## 2022-12-27 LAB — CBC WITH DIFFERENTIAL/PLATELET
Basophils Absolute: 0 10*3/uL (ref 0.0–0.1)
Basophils Relative: 0.5 % (ref 0.0–3.0)
Eosinophils Absolute: 0.2 10*3/uL (ref 0.0–0.7)
Eosinophils Relative: 2.3 % (ref 0.0–5.0)
HCT: 40.4 % (ref 39.0–52.0)
Hemoglobin: 13.6 g/dL (ref 13.0–17.0)
Lymphocytes Relative: 17.8 % (ref 12.0–46.0)
Lymphs Abs: 1.3 10*3/uL (ref 0.7–4.0)
MCHC: 33.5 g/dL (ref 30.0–36.0)
MCV: 93.9 fl (ref 78.0–100.0)
Monocytes Absolute: 0.6 10*3/uL (ref 0.1–1.0)
Monocytes Relative: 8.1 % (ref 3.0–12.0)
Neutro Abs: 5.2 10*3/uL (ref 1.4–7.7)
Neutrophils Relative %: 71.3 % (ref 43.0–77.0)
Platelets: 212 10*3/uL (ref 150.0–400.0)
RBC: 4.31 Mil/uL (ref 4.22–5.81)
RDW: 13.9 % (ref 11.5–15.5)
WBC: 7.2 10*3/uL (ref 4.0–10.5)

## 2022-12-27 LAB — HEMOGLOBIN A1C: Hgb A1c MFr Bld: 7.4 % — ABNORMAL HIGH (ref 4.6–6.5)

## 2022-12-27 NOTE — Patient Instructions (Addendum)
Let us know when you get your flu shot at your pharmacy.  Glad you are doing well- lets buckle back down on diet and exercise to get a1c back down  We have placed a referral for you today to [facility].please call them if you have not heard in a week Phone: [phone]  Please stop by lab before you go If you have mychart- we will send your results within 3 business days of [place] receiving them.  If you do not have mychart- we will call you about results within 5 business days of [place] receiving them.  *please also note that you will see labs on mychart as soon as they post. I will later go in and write notes on them- will say "notes from Dr. Durene Cal"   Recommended follow up: Return in about 4 months (around 04/28/2023) for physical or sooner if needed.Schedule b4 you leave.

## 2022-12-27 NOTE — Progress Notes (Signed)
Phone [phone] In person visit   Subjective:   Walter [name] is a 82 y.o. year old very pleasant male patient who presents for/with See problem oriented charting Chief Complaint  Patient presents with   Medical Management of Chronic Issues   Hypertension   Diabetes   Constipation    Pt c/o constipation since having GI bleed, has been using miralax   burning with urination    Pt c/o burning with urination off and on x 2weeks   Past Medical History-  Patient Active Problem List   Diagnosis Date Noted   Duodenal ulcer hemorrhage     Priority: High   Mild neurocognitive disorder, concerns for Alzheimer's disease 12/23/2019    Priority: High   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    Priority: High   BPH (benign prostatic hyperplasia) 07/02/2015    Priority: High   Diabetes mellitus type II, controlled 04/23/2007    Priority: High   Pulmonary nodule 02/04/2022    Priority: Medium    History of colon cancer 01/27/2014    Priority: Medium    Hyperlipidemia associated with type 2 diabetes mellitus 04/23/2007    Priority: Medium    Obstructive sleep apnea 04/23/2007    Priority: Medium    Essential hypertension 04/23/2007    Priority: Medium    Allergic rhinitis 08/19/2019    Priority: Low   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    Priority: Low   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    Priority: Low   Hallux valgus of right foot 03/26/2014    Priority: Low   Iritis 12/17/2008    Priority: Low   Postherpetic neuralgia 05/22/2008    Priority: Low   Bigeminy 04/21/2008    Priority: Low   History of dysplastic nevus    History of skin cancer    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     donepezil (ARICEPT) 10 MG tablet TAKE 1 TABLET BY MOUTH EVERYDAY AT BEDTIME 90 tablet 1   ferrous sulfate 325 (65 FE) MG tablet Take 1 tablet (325 mg total) by mouth 2 (two) times daily with a meal.     glipiZIDE (GLUCOTROL) 10 MG tablet TAKE 1 TABLET (10 MG TOTAL) BY MOUTH TWICE A DAY BEFORE A MEAL 180 tablet 3   Glucagon (GVOKE HYPOPEN 2-PACK) 0.5 MG/0.1ML SOAJ Inject 0.5 mg into the skin daily as needed (for low blood sugar). 0.2 mL 1   Lutein 20 MG TABS Take 20 mg by mouth daily.     metFORMIN (GLUCOPHAGE) 1000 MG tablet TAKE 1 TABLET BY MOUTH 2 TIMES DAILY WITH A MEAL. (Patient taking differently: Take 1,000 mg by mouth 2 (two) times daily with a meal.) 180 tablet 3   Multiple Vitamins-Minerals (CENTRUM SILVER PO) Take 1 tablet by mouth daily.      ONETOUCH ULTRA test strip USE ONCE DAILY AS INSTRUCTED 100 strip 3   rosuvastatin (CRESTOR) 20 MG tablet TAKE 1 TABLET BY MOUTH ONE TIME PER WEEK 13 tablet 3   sitaGLIPtin (JANUVIA) 100 MG tablet Take 1 tablet (100 mg total) by mouth daily. 90 tablet 3   telmisartan (MICARDIS) 80 MG tablet  Take 1 tablet (80 mg total) by mouth daily. 90 tablet 3   VITAMIN A PO Take 1 tablet by mouth daily.     pantoprazole (PROTONIX) 40 MG tablet Take 1 tablet (40 mg total) by mouth 2 (two) times daily. 60 tablet 2   No current facility-administered medications for this visit.     Objective:  BP 138/82   Pulse 66   Temp 98.2 F (36.8 C)   Ht 6' (1.829 m)   Wt 190 lb 3.2 oz (86.3 kg)   SpO2 93%   BMI 25.80 kg/m  Gen: NAD, resting comfortably CV: RRR no murmurs rubs or gallops Lungs: CTAB no crackles, wheeze, rhonchi Ext: no edema Skin: warm, dry  Results for orders placed or performed in visit on 12/27/22 (from the past 24 hour(s))  POCT Urinalysis Dipstick (Automated)     Status: Abnormal   Collection Time: 12/27/22  8:14 AM  Result Value Ref Range   Color, UA yellow    Clarity, UA clear     Glucose, UA Negative Negative   Bilirubin, UA negative    Ketones, UA positive    Spec Grav, UA 1.020 1.010 - 1.025   Blood, UA negatvie    pH, UA 6.0 5.0 - 8.0   Protein, UA Negative Negative   Urobilinogen, UA 0.2 0.2 or 1.0 E.U./dL   Nitrite, UA negative    Leukocytes, UA Trace (A) Negative       Assessment and Plan   #Concern for UTI S: Patients symptoms started 2 weeks ago.  Complains of dysuria: YES; polyuria: YES; nocturia: no; urgency: at times.  Symptoms are stable.  ROS- no fever, chills, nausea, vomiting, flank pain. No blood in urine.   A/P: UA will be obtained- leukocytes and some ketones. Likely UTI. Will get culture. Empiric treatment opted out- wants to get culture first Patient to follow up if new or worsening symptoms or failure to improve.   # Constipation-patient complains of constipation since upper GI bleed and hospitalization 01/26/2022-currently taking MiraLAX daily and has been for years . Last colonoscopy in 2021. Can go to toilet 4-5 times and no bowel movement- currently going every 3 days. No blood in stool. Years ago was daily but has been on miralax regularly.  -offered gastroenterology refer- he wants to hold off- may try 1.5 capfuls per day  #Mild cognitive impairment-follows with Dr. Karel Jarvis S: Medication: Donepezil 10 mg -worsened around 2021  A/P: Noted and stable with expected gradual decline-continue close follow-up with neurology   # Diabetes-poorly controlled at times  S: Medication:glipizide 10 mg twice daily and has Gvoke available, [place] 100mg  daily, metformin  1000mg  twice daily  -gvoke available at home- has not had to use  CBGs- no lows. 90's to 120s before- thinks trended up recently Exercise and diet- exercise down with recent vacation an diet slipped Lab Results  Component Value Date   HGBA1C 7.8 (H) 08/23/2022   HGBA1C 8.3 (H) 04/21/2022   HGBA1C 7.2 (A) 11/05/2021  A/P: Slightly above goal last check-hopefully  improving-update A1c with labs today- not interested in injections- prefers to work on diet and exercise as long as a1c under 9   #hypertension S: medication: Telmisartan 40 mg every day, amlodipine 5 mg every day Home readings #s: 120s at home BP Readings from Last 3 Encounters:  12/27/22 138/82  12/09/22 126/68  10/06/22 132/71   A/P: higher than usual but reasonable control and looks better at home- continue current medicines   #  hyperlipidemia S: Medication: Rosuvastatin 20 mg once a week  Lab Results  Component Value Date   CHOL 120 04/21/2022   HDL 30.20 (L) 04/21/2022   LDLCALC 59 04/21/2022   LDLDIRECT 90.0 09/14/2017   TRIG 156.0 (H) 04/21/2022   CHOLHDL 4 04/21/2022  A/P: reasonably Controlled. Continue current medications.  # GERD and history h pylori related ulcer/anemia S:Medication:   pantoprazole 40 mg daily after H. Pylori in sept 2023 with ulceration/hospitalization/anemia- on iron due to anemia from ulcer A/P: thankfully no obvious recurrent of bleed- no melena reported. Still on pantoprazole  # Pancreatic cyst-no increased metabolism on June scan with plan for 2-year repeat so likely June or July 2026  # Pulmonary nodule-no increased metabolic activity on PET scan-1 year repeat so likely July 2024  # Prostate increased metabolism on PET scan-patient with known underlying BPH and at time of last scan offered referral back to urology-some thickening of bladder wall as well- wonder if goes along with possible UTI Lab Results  Component Value Date   PSA 2.60 08/05/2013   PSA 2.35 12/03/2010   PSA 1.80 11/06/2009   Recommended follow up: Return in about 4 months (around 04/28/2023) for physical or sooner if needed.Schedule b4 you leave. Future Appointments  Date Time Provider Department Center  02/28/2023  8:45 AM Vivi Barrack, DPM [facility]  03/23/2023  1:00 PM Marcos Eke, PA-C [facility] None  05/18/2023  9:45 AM [facility] ANNUAL WELLNESS  VISIT 1 [facility]  11/01/2023  1:00 PM [name], PhD [facility] None  11/01/2023  2:00 PM [facility] None  11/08/2023  2:30 PM [name], PhD [facility] None    Lab/Order associations:   ICD-10-CM   1. Burning with urination  R30.0 POCT Urinalysis Dipstick (Automated)    Urine Culture    2. Controlled type 2 diabetes mellitus without complication, without long-term current use of insulin (HCC)  E11.9 Comprehensive metabolic panel    CBC with Differential/Platelet    Hemoglobin A1c    3. Essential hypertension  I10     4. Mild neurocognitive disorder, concerns for Alzheimer's disease  G30.9    F06.70     5. Pulmonary nodule  R91.1     6. Benign prostatic hyperplasia with urinary frequency  N40.1 Ambulatory referral to Urology   R35.0       No orders of the defined types were placed in this encounter.   Return precautions advised.  Tana Conch, MD

## 2022-12-28 LAB — URINE CULTURE
MICRO NUMBER:: 15387746
Result:: NO GROWTH
SPECIMEN QUALITY:: ADEQUATE

## 2023-01-09 ENCOUNTER — Other Ambulatory Visit: Payer: Self-pay | Admitting: Family Medicine

## 2023-01-16 LAB — HM DIABETES EYE EXAM

## 2023-01-17 ENCOUNTER — Encounter: Payer: Self-pay | Admitting: Family Medicine

## 2023-01-26 ENCOUNTER — Telehealth: Payer: Self-pay | Admitting: Podiatry

## 2023-01-26 NOTE — Telephone Encounter (Signed)
Pt returned call and left message stating he received a call from someone stating his diabetic shoes would not be in until later this year. She asked if he would like the alternant pair and he said go ahead and do that. He said thank you for calling and letting him know.

## 2023-02-28 ENCOUNTER — Ambulatory Visit: Payer: Medicare Other | Admitting: Podiatry

## 2023-02-28 ENCOUNTER — Encounter: Payer: Self-pay | Admitting: Podiatry

## 2023-02-28 DIAGNOSIS — L84 Corns and callosities: Secondary | ICD-10-CM | POA: Diagnosis not present

## 2023-02-28 DIAGNOSIS — B351 Tinea unguium: Secondary | ICD-10-CM | POA: Diagnosis not present

## 2023-02-28 DIAGNOSIS — L97511 Non-pressure chronic ulcer of other part of right foot limited to breakdown of skin: Secondary | ICD-10-CM

## 2023-02-28 DIAGNOSIS — M79674 Pain in right toe(s): Secondary | ICD-10-CM | POA: Diagnosis not present

## 2023-02-28 DIAGNOSIS — M79675 Pain in left toe(s): Secondary | ICD-10-CM | POA: Diagnosis not present

## 2023-02-28 DIAGNOSIS — E1149 Type 2 diabetes mellitus with other diabetic neurological complication: Secondary | ICD-10-CM | POA: Diagnosis not present

## 2023-02-28 DIAGNOSIS — M216X1 Other acquired deformities of right foot: Secondary | ICD-10-CM

## 2023-02-28 DIAGNOSIS — L97521 Non-pressure chronic ulcer of other part of left foot limited to breakdown of skin: Secondary | ICD-10-CM

## 2023-02-28 MED ORDER — MUPIROCIN 2 % EX OINT: 1.0000 | TOPICAL_OINTMENT | Freq: Two times a day (BID) | CUTANEOUS | 2 refills | Status: DC

## 2023-02-28 NOTE — Progress Notes (Signed)
Subjective: Chief Complaint  Patient presents with   Routine Post Op    PATIENT STATES THAT HE SUPPOSE TO GET NEW SHOES, JUST BEEN WAITING ON HIS SHOES .  PATIENT STATES HE HAS NOT BEEN IN NO PAIN     82 year old male presents the office today with above concerns.  He recently just went on a cruise he states he had his nails trimmed and when this occurred he did have bleeding to his toes.  Seems to get a little bit better.  No pus or swelling or redness.  He is also concerned about pain in the ball of his foot underneath the submetatarsal 2 which she points to.  Last A1c was 7.4 in December 27, 2022  Shelva Majestic, MD Last seen 10/06/2022   Objective: AAO x3, NAD DP/PT pulses palpable bilaterally, CRT less than 3 seconds Nails are hypertrophic, dystrophic, brittle, discolored, elongated 9. No surrounding redness or drainage. 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 No open lesions or pre-ulcerative lesions are identified today.  There are superficial wounds present on the left fourth, right third toe without any surrounding erythema, ascending cellulitis.  No fluctuance workup or tissue pain is moderate. Previous right second toe amputation.  There is prominence of the metatarsal head plantarly.  This hyperkeratotic lesion and upon debridement there is no underlying ulceration, drainage or any signs of infection noted today. No pain with calf compression, swelling, warmth, erythema  Assessment: 82 year old male with symptomatic onychomycosis; preulcerative callus, prominent metatarsal heads; ulcer bilateral toes  Plan: -All treatment options discussed with the patient including all alternatives, risks, complications.  -Sharply debrided the nails x 9 without any complications or bleeding. -Sharply debrided the hyperkeratotic lesion x 1 without any complications or bleeding. -For the toe ulcerations mupirocin ointment dressing changes daily was  prescribed.  Offloading.  Monitoring signs or symptoms of infection.  Not healed next 1 to 2 weeks for me know or sooner if there is any issues. -Daily foot inspection. -Patient encouraged to call the office with any questions, concerns, change in symptoms.   Return in about 3 months (around 05/31/2023).  Vivi Barrack DPM   Left 4th toe and right 3rd toe

## 2023-02-28 NOTE — Patient Instructions (Signed)
Apply small amount of antibiotic ointment, mupirocin ointment to the wounds daily.  Monitoring signs or symptoms of infection.  If not healed next 1 to 2 weeks please let me know.

## 2023-03-03 DIAGNOSIS — J189 Pneumonia, unspecified organism: Secondary | ICD-10-CM

## 2023-03-03 HISTORY — DX: Pneumonia, unspecified organism: J18.9

## 2023-03-08 ENCOUNTER — Ambulatory Visit: Payer: Medicare Other | Admitting: Gastroenterology

## 2023-03-08 ENCOUNTER — Encounter: Payer: Self-pay | Admitting: Gastroenterology

## 2023-03-08 VITALS — BP 118/60 | HR 77 | Ht 72.0 in | Wt 187.0 lb

## 2023-03-08 DIAGNOSIS — Z8719 Personal history of other diseases of the digestive system: Secondary | ICD-10-CM

## 2023-03-08 DIAGNOSIS — Z85038 Personal history of other malignant neoplasm of large intestine: Secondary | ICD-10-CM | POA: Diagnosis not present

## 2023-03-08 DIAGNOSIS — R195 Other fecal abnormalities: Secondary | ICD-10-CM | POA: Diagnosis not present

## 2023-03-08 MED ORDER — NA SULFATE-K SULFATE-MG SULF 17.5-3.13-1.6 GM/177ML PO SOLN: 1.0000 | Freq: Once | ORAL | 0 refills | Status: AC

## 2023-03-08 NOTE — Progress Notes (Signed)
    Assessment     Change in stool caliber likely related to constipation - R/O colorectal neoplasm, stricture Chronic idiopathic constipation  Personal history of colon cancer, adenomatous polyps and sessile serrated polyps History of a duodenal ulcer with bleed in 2023, H pylori treated with quadruple therapy   Recommendations    Schedule colonoscopy. The risks (including bleeding, perforation, infection, missed lesions, medication reactions and possible hospitalization or surgery if complications occur), benefits, and alternatives to colonoscopy with possible biopsy and possible polypectomy were discussed with the patient and they consent to proceed.   Increase MiraLAX to twice daily   HPI    This is a 82 year old male who relates a change in stool caliber and ongoing constipation.  He is accompanied by his wife.  He notes his stools have been smaller in caliber for 2 to 3 years.  He takes MiraLAX daily for management of constipation which is generally effective but occasionally he needs an additional laxative dose.  No other gastrointestinal complaints. Denies weight loss, abdominal pain, diarrhea, change in stool caliber, melena, hematochezia, nausea, vomiting, dysphagia, reflux symptoms, chest pain.   Colonoscopy July 2021 - Two 6 mm polyps in the sigmoid colon and in the transverse colon, removed with a cold snare. Resected and retrieved.  - Patent end-to-end colo-colonic anastomosis, characterized by healthy appearing mucosa.  - Internal hemorrhoids.  - The examination was otherwise normal on direct and retroflexion views. Path: 1 TA, 1 SSP   Labs / Imaging       Latest Ref Rng & Units 12/27/2022    8:36 AM 08/23/2022    8:49 AM 04/21/2022    8:26 AM  Hepatic Function  Total Protein 6.0 - 8.3 g/dL 7.0  7.5  7.0   Albumin 3.5 - 5.2 g/dL 4.1   4.2   AST 0 - 37 U/L 20  17  18    ALT 0 - 53 U/L 33  23  23   Alk Phosphatase 39 - 117 U/L 76   68   Total Bilirubin 0.2 - 1.2  mg/dL 0.6  0.7  0.4        Latest Ref Rng & Units 12/27/2022    8:36 AM 07/07/2022   10:47 AM 04/21/2022    8:26 AM  CBC  WBC 4.0 - 10.5 K/uL 7.2  9.4  5.3   Hemoglobin 13.0 - 17.0 g/dL 01.0  27.2  53.6   Hematocrit 39.0 - 52.0 % 40.4  40.7  40.9   Platelets 150.0 - 400.0 K/uL 212.0  241.0  217.0     Current Medications, Allergies, Past Medical History, Past Surgical History, Family History and Social History were reviewed in [employer] record.   Physical Exam: General: Well developed, well nourished, no acute distress Head: Normocephalic and atraumatic Eyes: Sclerae anicteric, EOMI Ears: Normal auditory acuity Mouth: No deformities or lesions noted Lungs: Clear throughout to auscultation Heart: Regular rate and rhythm; No murmurs, rubs or bruits Abdomen: Soft, non tender and non distended. No masses, hepatosplenomegaly or hernias noted. Normal Bowel sounds Rectal: Deferred to colonoscopy  Musculoskeletal: Symmetrical with no gross deformities  Pulses:  Normal pulses noted Extremities: No edema or deformities noted Neurological: Alert oriented x 4, grossly nonfocal Psychological:  Alert and cooperative. Normal mood and affect   Aidyn Kellis T. Russella Dar, MD 03/08/2023, 1:52 PM

## 2023-03-08 NOTE — Patient Instructions (Signed)
You have been scheduled for a colonoscopy. Please follow written instructions given to you at your visit today.   Please pick up your prep supplies at the pharmacy within the next 1-3 days.  If you use inhalers (even only as needed), please bring them with you on the day of your procedure.  DO NOT TAKE 7 DAYS PRIOR TO TEST- Trulicity (dulaglutide) Ozempic, Wegovy (semaglutide) Mounjaro (tirzepatide) Bydureon Bcise (exanatide extended release)  DO NOT TAKE 1 DAY PRIOR TO YOUR TEST Rybelsus (semaglutide) Adlyxin (lixisenatide) Victoza (liraglutide) Byetta (exanatide) ___________________________________________________________________________  The [facility] providers would like to encourage you to use [facility] to communicate with providers for non-urgent requests or questions.  Due to long hold times on the telephone, sending your provider a message by [facility] may be a faster and more efficient way to get a response.  Please allow 48 business hours for a response.  Please remember that this is for non-urgent requests.   Due to recent changes in healthcare laws, you may see the results of your imaging and laboratory studies on MyChart before your provider has had a chance to review them.  We understand that in some cases there may be results that are confusing or concerning to you. Not all laboratory results come back in the same time frame and the provider may be waiting for multiple results in order to interpret others.  Please give [place] 48 hours in order for your provider to thoroughly review all the results before contacting the office for clarification of your results.   Thank you for choosing me and [facility].  Venita Lick. Pleas Koch., MD., Clementeen Graham

## 2023-03-10 NOTE — Progress Notes (Unsigned)
   [name], PhD, LAT, ATC acting as a scribe for Clementeen Graham, MD.  Walter [name] is a 82 y.o. male who presents to [facility] at [facility] today for LBP after suffering a fall about a wk ago. 10/25 His wife notes the he fell trying to get out of the shower, landing on his R hip. On 10/28, he tripped on a stick and fell again. Wife also notes he seems to be unsteady. Pt locates pain to both sides of his low back, L>R.  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  Social determinants of health: Mild neurocognitive disorder and diabetes may play a role here.  Exam:  BP (!) 108/58   Pulse 69   Ht 6' (1.829 m)   Wt 186 lb (84.4 kg)   SpO2 96%   BMI 25.23 kg/m  General: Well Developed, well nourished, and in no acute distress.   MSK: Right hip normal-appearing nontender to palpation. L-spine nontender to palpation midline normal lumbar motion.  Lower extremity strength is intact.    Lab and Radiology Results  X-ray images lumbar spine obtained today personally and independently interpreted.  No acute fractures are visible.  Mild degenerative changes are present. Await formal radiology review    Assessment and Plan: 82 y.o. male with right low back pain occurring after a fall.  Pain is improving spontaneously.  Plan to continue activity as tolerated.  If not improving or his balance continues to be an issue his wife will let me know and I would recommend referral to physical therapy for balance or gait training.  Recheck back as needed.   PDMP not reviewed this encounter. Orders Placed This Encounter  Procedures   DG Lumbar Spine 2-3 Views    Standing Status:   Future    Number of Occurrences:   1    Standing Expiration Date:    04/12/2023    Order Specific Question:   Reason for Exam (SYMPTOM  OR DIAGNOSIS REQUIRED)    Answer:   low back pain    Order Specific Question:   Preferred imaging location?    Answer:   [facility]   No orders of the defined types were placed in this encounter.    Discussed warning signs or symptoms. Please see discharge instructions. Patient expresses understanding.   The above documentation has been reviewed and is accurate and complete Clementeen Graham, M.D.

## 2023-03-13 ENCOUNTER — Ambulatory Visit: Payer: Medicare Other | Admitting: Family Medicine

## 2023-03-13 ENCOUNTER — Ambulatory Visit (INDEPENDENT_AMBULATORY_CARE_PROVIDER_SITE_OTHER): Payer: Medicare Other

## 2023-03-13 VITALS — BP 108/58 | HR 69 | Ht 72.0 in | Wt 186.0 lb

## 2023-03-13 DIAGNOSIS — M25551 Pain in right hip: Secondary | ICD-10-CM | POA: Diagnosis not present

## 2023-03-13 DIAGNOSIS — M545 Low back pain, unspecified: Secondary | ICD-10-CM

## 2023-03-13 NOTE — Patient Instructions (Signed)
Thank you for coming in today.   We will also send the results to phone call.   Keep working on normal activity.   If not getting better let me know and we can do physical therapy.

## 2023-03-15 NOTE — Progress Notes (Signed)

## 2023-03-23 ENCOUNTER — Encounter: Payer: Self-pay | Admitting: Physician Assistant

## 2023-03-23 ENCOUNTER — Ambulatory Visit: Payer: Medicare Other | Admitting: Physician Assistant

## 2023-03-23 VITALS — BP 116/70 | HR 70 | Resp 20 | Ht 72.0 in | Wt 186.0 lb

## 2023-03-23 DIAGNOSIS — G309 Alzheimer's disease, unspecified: Secondary | ICD-10-CM

## 2023-03-23 DIAGNOSIS — F0670 Mild neurocognitive disorder due to known physiological condition without behavioral disturbance: Secondary | ICD-10-CM | POA: Diagnosis not present

## 2023-03-23 NOTE — Patient Instructions (Addendum)
Good to see you.  Continue Donepezil to 10mg  daily. 1 tablet daily Recommend psychotherapy for depression and anxiety   Recommend hearing and  vision check  3 Repeat neuropsych evaluation in 10/2023  4 Follow-up in 6 months, call for any changes   RECOMMENDATIONS FOR ALL PATIENTS WITH MEMORY PROBLEMS: 1. Continue to exercise (Recommend 30 minutes of walking everyday, or 3 hours every week) 2. Increase social interactions - continue going to [place] and enjoy social gatherings with friends and family 3. Eat healthy, avoid fried foods and eat more fruits and vegetables 4. Maintain adequate blood pressure, blood sugar, and blood cholesterol level. Reducing the risk of stroke and cardiovascular disease also helps promoting better memory. 5. Avoid stressful situations. Live a simple life and avoid aggravations. Organize your time and prepare for the next day in anticipation. 6. Sleep well, avoid any interruptions of sleep and avoid any distractions in the bedroom that may interfere with adequate sleep quality 7. Avoid sugar, avoid sweets as there is a strong link between excessive sugar intake, diabetes, and cognitive impairment We discussed the Mediterranean diet, which has been shown to help patients reduce the risk of progressive memory disorders and reduces cardiovascular risk. This includes eating fish, eat fruits and green leafy vegetables, nuts like almonds and hazelnuts, walnuts, and also use olive oil. Avoid fast foods and fried foods as much as possible. Avoid sweets and sugar as sugar use has been linked to worsening of memory function.  There is always a concern of gradual progression of memory problems. If this is the case, then we may need to adjust level of care according to patient needs. Support, both to the patient and caregiver, should then be put into place.      Mediterranean Diet  Why follow it? Research shows. Those who follow the Mediterranean diet have a reduced risk of  heart disease  The diet is associated with a reduced incidence of Parkinson's and Alzheimer's diseases People following the diet may have longer life expectancies and lower rates of chronic diseases  The Dietary Guidelines for Americans recommends the Mediterranean diet as an eating plan to promote health and prevent disease  What Is the Mediterranean Diet?  Healthy eating plan based on typical foods and recipes of Mediterranean-style cooking The diet is primarily a plant based diet; these foods should make up a majority of meals   Starches - Plant based foods should make up a majority of meals - They are an important sources of vitamins, minerals, energy, antioxidants, and fiber - Choose whole grains, foods high in fiber and minimally processed items  - Typical grain sources include wheat, oats, barley, corn, brown rice, bulgar, farro, millet, polenta, couscous  - Various types of beans include chickpeas, lentils, fava beans, black beans, white beans   Fruits  Veggies - Large quantities of antioxidant rich fruits & veggies; 6 or more servings  - Vegetables can be eaten raw or lightly drizzled with oil and cooked  - Vegetables common to the traditional Mediterranean Diet include: artichokes, arugula, beets, broccoli, brussel sprouts, cabbage, carrots, celery, collard greens, cucumbers, eggplant, kale, leeks, lemons, lettuce, mushrooms, okra, onions, peas, peppers, potatoes, pumpkin, radishes, rutabaga, shallots, spinach, sweet potatoes, turnips, zucchini - Fruits common to the Mediterranean Diet include: apples, apricots, avocados, cherries, clementines, dates, figs, grapefruits, grapes, melons, nectarines, oranges, peaches, pears, pomegranates, strawberries, tangerines  Fats - Replace butter and margarine with healthy oils, such as olive oil, canola oil, and tahini  - Limit nuts  to no more than a handful a day  - Nuts include walnuts, almonds, pecans, pistachios, pine nuts  - Limit or avoid  candied, honey roasted or heavily salted nuts - Olives are central to the Mediterranean diet - can be eaten whole or used in a variety of dishes   Meats Protein - Limiting red meat: no more than a few times a month - When eating red meat: choose lean cuts and keep the portion to the size of deck of cards - Eggs: approx. 0 to 4 times a week  - Fish and lean poultry: at least 2 a week  - Healthy protein sources include, chicken, [place], lean beef, lamb - Increase intake of seafood such as tuna, salmon, trout, mackerel, shrimp, scallops - Avoid or limit high fat processed meats such as sausage and bacon  Dairy - Include moderate amounts of low fat dairy products  - Focus on healthy dairy such as fat free yogurt, skim milk, low or reduced fat cheese - Limit dairy products higher in fat such as whole or 2% milk, cheese, ice cream  Alcohol - Moderate amounts of red wine is ok  - No more than 5 oz daily for women (all ages) and men older than age 18  - No more than 10 oz of wine daily for men younger than 13  Other - Limit sweets and other desserts  - Use herbs and spices instead of salt to flavor foods  - Herbs and spices common to the traditional Mediterranean Diet include: basil, bay leaves, chives, cloves, cumin, fennel, garlic, lavender, marjoram, mint, oregano, parsley, pepper, rosemary, sage, savory, sumac, tarragon, thyme   It's not just a diet, it's a lifestyle:  The Mediterranean diet includes lifestyle factors typical of those in the region  Foods, drinks and meals are best eaten with others and savored Daily physical activity is important for overall good health This could be strenuous exercise like running and aerobics This could also be more leisurely activities such as walking, housework, yard-work, or taking the stairs Moderation is the key; a balanced and healthy diet accommodates most foods and drinks Consider portion sizes and frequency of consumption of certain foods   Meal  Ideas & Options:  Breakfast:  Whole wheat toast or whole wheat English muffins with peanut butter & hard boiled egg Steel cut oats topped with apples & cinnamon and skim milk  Fresh fruit: banana, strawberries, melon, berries, peaches  Smoothies: strawberries, bananas, greek yogurt, peanut butter Low fat greek yogurt with blueberries and granola  Egg white omelet with spinach and mushrooms Breakfast couscous: whole wheat couscous, apricots, skim milk, cranberries  Sandwiches:  Hummus and grilled vegetables (peppers, zucchini, squash) on whole wheat bread   Grilled chicken on whole wheat pita with lettuce, tomatoes, cucumbers or tzatziki  [place] salad on whole wheat bread: tuna salad made with greek yogurt, olives, red peppers, capers, green onions Garlic rosemary lamb pita: lamb sauted with garlic, rosemary, salt & pepper; add lettuce, cucumber, greek yogurt to pita - flavor with lemon juice and black pepper  Seafood:  Mediterranean grilled salmon, seasoned with garlic, basil, parsley, lemon juice and black pepper Shrimp, lemon, and spinach whole-grain pasta salad made with low fat greek yogurt  Seared scallops with lemon orzo  Seared tuna steaks seasoned salt, pepper, coriander topped with tomato mixture of olives, tomatoes, olive oil, minced garlic, parsley, green onions and cappers  Meats:  Herbed greek chicken salad with kalamata olives, cucumber, feta  Red  bell peppers stuffed with spinach, bulgur, lean ground beef (or lentils) & topped with feta   Kebabs: skewers of chicken, tomatoes, onions, zucchini, squash  [place] burgers: made with red onions, mint, dill, lemon juice, feta cheese topped with roasted red peppers Vegetarian Cucumber salad: cucumbers, artichoke hearts, celery, red onion, feta cheese, tossed in olive oil & lemon juice  Hummus and whole grain pita points with a greek salad (lettuce, tomato, feta, olives, cucumbers, red onion) Lentil soup with celery, carrots made with  vegetable broth, garlic, salt and pepper  Tabouli salad: parsley, bulgur, mint, scallions, cucumbers, tomato, radishes, lemon juice, olive oil, salt and pepper.

## 2023-03-23 NOTE — Progress Notes (Signed)
Assessment/Plan:   Mild Cognitive Impairment, concern for Alzheimer's Disease   Walter [name] is a very pleasant 82 y.o. RH male with a history of hypertension, hyperlipidemia, diabetes, sleep apnea on CPAP, colon cancer s/p resection, prior history of H. pylori,  and a history of mild cognitive impairment, with etiology still concerning for Alzheimer's disease with vascular contribution per neuropsych evaluation 12/2022  presenting today in follow-up for evaluation of memory loss. Patient is on donepezil 10 mg daily. Memory is stable, but he needs to use hearing aids to help with comprehension. HE oesST to help with communication skills. Patient is able to participate on his ADLs and to drive without difficulties      Recommendations:   Follow up in 6  months. Continue donepezil 10 mg daily. Side effects were discussed  Recommend psychotherapy for PTSD, anxiety, depression. He is on Lexapro as per PCP Check hearing to improve comprehension  Recommend visual examination for reading glasses  Continue CPAP for OSA Recommend using a cane for stability   Continue ST  Recommend good control of cardiovascular risk factors     Subjective:   This patient is accompanied in the office by   who supplements the history. Previous records as well as any outside records available were reviewed prior to todays visit.  Patient was last seen on  09/20/22 with MMSE 26/30.     Any changes in memory since last visit? "About the same" He has difficult y with speech, he had ST which was helpful. He continues to have difficulties retrieving some words which causes frustration. He continues to do crossword puzzles (120 pages), reading, does not watch much TV. repeats oneself?  Endorsed. Disoriented when walking into a room?  Patient denies   Misplacing objects?  Patient denies.   Wandering behavior?   Denies.   Any personality changes since last visit? As before, he becomes frustrated when he cannot  remember Any worsening depression?: denies   Hallucinations or paranoia?  denies   Seizures?   denies'    Any sleep changes?  Sleeps well.  He has less vivid dreams than before, especially since using the CPAP.  Denies REM behavior or sleepwalking   Sleep apnea? Endorsed, uses CPAP follows pulmonary.    Any hygiene concerns?   Needs reminder.  Independent of bathing and dressing?  Endorsed  Does the patient needs help with medications? Patient is in charge, but is misplacing them. Wife to take over.    Who is in charge of the finances? Wife is in charge     Any changes in appetite?  Denies, eating healthy    Patient have trouble swallowing?  denies   Does the patient cook? No  Any headaches?    denies   Vision changes? denies Chronic pain?  denies   Ambulates with difficulty? He walks frequently despite  R second toe amputation in the past. He enjoys skeet shooting.     Recent falls or head injuries? Unsteady has had mechanical falls, sometimes when getting up .   Unilateral weakness, numbness or tingling?   Denies.   Any tremors? Minimal, B, intermittent, none today Any anosmia?    denies   Any incontinence of urine?  denies   Any bowel dysfunction? Has chronic constipation.      Patient lives with wife  Does the patient drive? Yes, denies any issues. Seldom he uses GPS if in unfamiliar territory.   Neuropsych evaluation 12/2022  Briefly, results suggested prominent impairment surrounding  executive functioning, semantic fluency, confrontation naming, and all aspects of learning and memory. Performance variability was further noted across processing speed and phonemic fluency. Relative to his previous evaluation in August 2022, progressive decline was exhibited across executive functioning, both semantic and phonemic fluency, confrontation naming, and essentially all aspects of learning and memory. This appeared most pronounced surrounding aspects of expressive language. Regarding etiology,  I continue to have concern surrounding the presence of underlying Alzheimer's disease. This is based not only on the overall pattern of current deficits, but also given that progressive decline involving the exact areas exhibited by Walter [name] across the current evaluation follows the expected pattern of progression in this illness. Decline appears relatively mild which could suggest the continued slowed progression of this illness. Expressive language decline does appear more mild to moderate in speed.     History on Initial Assessment 11/25/2019: This is a 82 year old right-handed man with a history of hypertension, hyperlipidemia, diabetes, sleep apnea on CPAP, colon cancer s/p resection, presenting for evaluation of memory loss/word-finding difficulties. He reported symptoms started 2 years ago, MMSE 25/30 in PCP office last 08/2019. He and his wife started noticing more significant changes in the past 1 and 1/2 years. He endorses word-finding difficulties, he tries to tell something and the important part slips by him. He states it is not constant, however his wife says it happens a lot. He is searching for words and thoughts, unable to remember names of things or restaurants. He is forgetting how to do things sometimes, this is not often, but one time he forgot how to put his car in park (had to push a button). He has had this car for 2 years. He repeats stories. He cannot focus when watching TV and she is speaking to him, unable to multitask. He says "huh" or "what did you say" a lot, and she does not think it is his hearing. He lives with his wife. He denies getting lost driving, no missed ills or medications. He denies misplacing things. His wife has also noticed personality changes. He has a short temper worse than before and gets very nasty. When he cannot think of a word, it makes him nervous and uptight. He gets angry and upset when criticized or when he does not agree. No paranoia or hallucinations,  no prior psychiatric history. He states his mood is good. His mother had dementia. No history of significant head injuries. No alcohol use.   He had constipation which resolved with a stool softener. He denies any headaches, dizziness, diplopia, dysarthria/dysphagia, neck/back pain, focal numbness/tingling/weakness, bladder dysfunction, anosmia, or tremors. He sleeps like a baby with his CPAP machine. 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 All started after a hemorrhoid surgery and later with urinary retention. Patient at one point was septic from urinary issues.     Diabetes mellitus type II, controlled 04/23/2007    Metformin 1g BID, amaryl 4mg --> 8mg , had to add [place] back  Never took victoza.   Januvia-lethargic and dizzy in past. 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 Follows with Dr. 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   Pulmonary nodule 02/04/2022   Sepsis secondary to UTI 03/29/2015     Past Surgical History:  Procedure Laterality Date   AMPUTATION TOE Right 09/14/2016   Procedure: RIGHT 2ND TOE/RAY AMPUTATION;  Surgeon: Tarry Kos, MD;  Location: [facility];  Service: Orthopedics;  Laterality: Right;   APPENDECTOMY  05/03/1947   BIOPSY  01/28/2022   Procedure: BIOPSY;  Surgeon: Sherrilyn Rist, MD;  Location: [facility] END[facility]COPY;  Service: Gastroenterology;;   COLECTOMY  05/02/1990   CYSTOSCOPY N/A 06/21/2015   Procedure: CYSTOSCOPY, CLOT EVACUATION, AND CAUTERIZATION OF PROSTATE FOSSA;  Surgeon: Jethro Bolus, MD;  Location: [facility];  Service: Urology;  Laterality: N/A;   ESOPHAGOGASTRODUODENOSCOPY (EGD) WITH PROPOFOL N/A 01/26/2022   Procedure: ESOPHAGOGASTRODUODENOSCOPY (EGD) WITH PROPOFOL;  Surgeon: Sherrilyn Rist, MD;  Location: [facility]COPY;  Service: Gastroenterology;  Laterality: N/A;   ESOPHAGOGASTRODUODENOSCOPY (EGD) WITH PROPOFOL N/A 01/28/2022   Procedure: ESOPHAGOGASTRODUODENOSCOPY (EGD) WITH PROPOFOL;  Surgeon: Sherrilyn Rist, MD;  Location: [facility] ENDOSCOPY;  Service: Gastroenterology;  Laterality: N/A;   HEMORRHOID SURGERY     HEMOSTASIS CLIP PLACEMENT  01/26/2022   Procedure: HEMOSTASIS CLIP PLACEMENT;  Surgeon: Sherrilyn Rist, MD;  Location: [facility] ENDOSCOPY;  Service: Gastroenterology;;   HEMOSTASIS CONTROL  01/26/2022   Procedure: HEMOSTASIS CONTROL;  Surgeon: Sherrilyn Rist, MD;  Location: [facility] ENDOSCOPY;  Service: Gastroenterology;;   HOT HEMOSTASIS N/A 01/26/2022   Procedure: HOT HEMOSTASIS (ARGON PLASMA COAGULATION/BICAP);  Surgeon: Sherrilyn Rist, MD;  Location[facility] ENDOSCOPY;  Service: Gastroenterology;  Laterality: N/A;   MOLE REMOVAL  2022   hand    SCLEROTHERAPY  01/26/2022   Procedure: SCLEROTHERAPY;  Surgeon: Sherrilyn Rist, MD;  Location: [facility] ENDOSCOPY;  Service: Gastroenterology;;   TOE SURGERY  04/2020   TRANSURETHRAL RESECTION OF PROSTATE N/A 05/23/2015   Procedure: TRANSURETHRAL RESECTION OF THE PROSTATE (TURP);  Surgeon: Bjorn Pippin, MD;  Location: [facility];  Service: Urology;  Laterality: N/A;     PREVIOUS MEDICATIONS:   CURRENT MEDICATIONS:  Outpatient Encounter Medications as of 03/23/2023  Medication Sig   amLODipine (NORVASC) 5 MG tablet TAKE 1 TABLET BY MOUTH EVERY DAY   [employer] STOMACH RELIEF 262 MG chewable tablet Chew 2 tablets by mouth 4 (four) times daily.   donepezil (ARICEPT) 10 MG tablet TAKE 1 TABLET BY MOUTH EVERYDAY AT BEDTIME   ferrous sulfate 325 (65 FE) MG tablet Take 1 tablet (325 mg total) by mouth 2 (two)  times daily with a meal.   glipiZIDE (GLUCOTROL) 10 MG tablet TAKE 1 TABLET (10 MG TOTAL) BY MOUTH TWICE A DAY BEFORE A MEAL   Glucagon (GVOKE HYPOPEN 2-PACK) 0.5 MG/0.1ML SOAJ Inject 0.5 mg into the skin daily as needed (for low blood sugar).   Lutein 20 MG TABS Take 20 mg by mouth daily.   metFORMIN (GLUCOPHAGE) 1000 MG tablet TAKE 1 TABLET BY MOUTH TWICE A DAY WITH A MEAL   Multiple Vitamins-Minerals (CENTRUM SILVER PO) Take 1 tablet by mouth daily.    mupirocin ointment (BACTROBAN) 2 % Apply 1 Application topically 2 (two) times daily.   ONETOUCH ULTRA test strip USE ONCE DAILY AS INSTRUCTED   rosuvastatin (CRESTOR) 20 MG tablet TAKE 1 TABLET BY MOUTH ONE TIME PER WEEK   sitaGLIPtin (JANUVIA) 100 MG tablet Take 1 tablet (100 mg total) by mouth daily.   telmisartan (MICARDIS) 80 MG tablet Take 1 tablet (80 mg total) by mouth daily.   VITAMIN A PO Take 1 tablet by mouth daily.   pantoprazole (PROTONIX) 40 MG tablet Take 1 tablet (40 mg total) by mouth 2 (two) times daily.   No facility-administered encounter medications on file as of 03/23/2023.     Objective:     PHYSICAL EXAMINATION:     VITALS:   Vitals:   03/23/23 1258  BP: 116/70  Pulse: 70  Resp: 20  SpO2: 99%  Weight: 186 lb (84.4 kg)  Height: 6' (1.829 m)    GEN:  The patient appears stated age and is in NAD. HEENT:  Normocephalic, atraumatic.   Neurological examination:  General: NAD, well-groomed, appears stated age. Orientation: The patient is alert. Oriented to person, place and not to date Cranial nerves: There is good facial symmetry.The speech is not fluent and clear, occasionally pausing to find the right word. No aphasia or dysarthria. Fund of knowledge is appropriate. Recent memory impaired and remote memory is normal.  Attention and concentration are normal.  Able to name objects and repeat phrases.  Hearing is intact to conversational tone .  Sensation: Sensation is intact to light touch throughout. Prior R 2nd toe amputation Motor: Strength is at least antigravity x4. DTR's 2/4 in UE/LE       No data to display             09/20/2022    5:00 PM 09/02/2021   11:00 AM 02/19/2016   10:30 AM  MMSE - Mini Mental State Exam  Not completed:   --  Orientation to time 3 5   Orientation to Place 5 5   Registration 3 3   Attention/ Calculation 5 5   Recall 1 0   Language- name 2 objects 2 2   Language- repeat 1 1   Language- follow 3 step command 3 3   Language- read & follow direction 1 1   Write a sentence 1 1   Copy design 1 1   Total score 26 27        Movement examination: Tone: There is normal tone in the UE/LE Abnormal movements:  no tremor.  No myoclonus.  No asterixis.   Coordination:  There is no decremation with RAM's. Normal finger to nose  Gait and Station: The patient has no difficulty arising out of a deep-seated chair without the use of the hands. The patient's stride length is shorter than prior. Gait is cautious and narrow.   Thank you for allowing [place] the opportunity to participate in the care  of this nice patient. Please do not hesitate to contact us for any  questions or concerns.   Total time spent on today's visit was 36 minutes dedicated to this patient today, preparing to see patient, examining the patient, ordering tests and/or medications and counseling the patient, documenting clinical information in the EHR or other health record, independently interpreting results and communicating results to the patient/family, discussing treatment and goals, answering patient's questions and coordinating care.  Cc:  Shelva Majestic, MD  Marlowe Kays 03/23/2023 1:07 PM

## 2023-03-31 ENCOUNTER — Other Ambulatory Visit: Payer: Self-pay

## 2023-03-31 ENCOUNTER — Emergency Department (HOSPITAL_COMMUNITY): Payer: Medicare Other

## 2023-03-31 ENCOUNTER — Inpatient Hospital Stay (HOSPITAL_COMMUNITY)
Admission: EM | Admit: 2023-03-31 | Discharge: 2023-04-02 | DRG: 871 | Disposition: A | Discharge status: Home Health | Payer: Medicare Other | Attending: Student | Admitting: Student

## 2023-03-31 ENCOUNTER — Encounter (HOSPITAL_COMMUNITY): Payer: Self-pay

## 2023-03-31 DIAGNOSIS — J189 Pneumonia, unspecified organism: Secondary | ICD-10-CM | POA: Diagnosis present

## 2023-03-31 DIAGNOSIS — I161 Hypertensive emergency: Secondary | ICD-10-CM | POA: Diagnosis present

## 2023-03-31 DIAGNOSIS — Z79899 Other long term (current) drug therapy: Secondary | ICD-10-CM

## 2023-03-31 DIAGNOSIS — G4733 Obstructive sleep apnea (adult) (pediatric): Secondary | ICD-10-CM | POA: Diagnosis present

## 2023-03-31 DIAGNOSIS — E041 Nontoxic single thyroid nodule: Secondary | ICD-10-CM | POA: Diagnosis present

## 2023-03-31 DIAGNOSIS — Z800 Family history of malignant neoplasm of digestive organs: Secondary | ICD-10-CM

## 2023-03-31 DIAGNOSIS — F05 Delirium due to known physiological condition: Secondary | ICD-10-CM | POA: Diagnosis present

## 2023-03-31 DIAGNOSIS — F0280 Dementia in other diseases classified elsewhere without behavioral disturbance: Secondary | ICD-10-CM | POA: Diagnosis present

## 2023-03-31 DIAGNOSIS — E872 Acidosis, unspecified: Secondary | ICD-10-CM | POA: Diagnosis present

## 2023-03-31 DIAGNOSIS — R6520 Severe sepsis without septic shock: Secondary | ICD-10-CM | POA: Diagnosis present

## 2023-03-31 DIAGNOSIS — J690 Pneumonitis due to inhalation of food and vomit: Secondary | ICD-10-CM | POA: Diagnosis present

## 2023-03-31 DIAGNOSIS — E1169 Type 2 diabetes mellitus with other specified complication: Secondary | ICD-10-CM | POA: Diagnosis present

## 2023-03-31 DIAGNOSIS — G9341 Metabolic encephalopathy: Secondary | ICD-10-CM | POA: Diagnosis present

## 2023-03-31 DIAGNOSIS — A419 Sepsis, unspecified organism: Secondary | ICD-10-CM | POA: Diagnosis not present

## 2023-03-31 DIAGNOSIS — G9340 Encephalopathy, unspecified: Secondary | ICD-10-CM | POA: Diagnosis not present

## 2023-03-31 DIAGNOSIS — Z882 Allergy status to sulfonamides status: Secondary | ICD-10-CM

## 2023-03-31 DIAGNOSIS — E876 Hypokalemia: Secondary | ICD-10-CM | POA: Diagnosis present

## 2023-03-31 DIAGNOSIS — R410 Disorientation, unspecified: Secondary | ICD-10-CM

## 2023-03-31 DIAGNOSIS — I10 Essential (primary) hypertension: Secondary | ICD-10-CM | POA: Diagnosis present

## 2023-03-31 DIAGNOSIS — Z85828 Personal history of other malignant neoplasm of skin: Secondary | ICD-10-CM

## 2023-03-31 DIAGNOSIS — F03918 Unspecified dementia, unspecified severity, with other behavioral disturbance: Secondary | ICD-10-CM | POA: Diagnosis present

## 2023-03-31 DIAGNOSIS — E119 Type 2 diabetes mellitus without complications: Secondary | ICD-10-CM | POA: Diagnosis not present

## 2023-03-31 DIAGNOSIS — Z89421 Acquired absence of other right toe(s): Secondary | ICD-10-CM

## 2023-03-31 DIAGNOSIS — G309 Alzheimer's disease, unspecified: Secondary | ICD-10-CM | POA: Diagnosis present

## 2023-03-31 DIAGNOSIS — Z1152 Encounter for screening for COVID-19: Secondary | ICD-10-CM

## 2023-03-31 DIAGNOSIS — Z85038 Personal history of other malignant neoplasm of large intestine: Secondary | ICD-10-CM

## 2023-03-31 DIAGNOSIS — Z87891 Personal history of nicotine dependence: Secondary | ICD-10-CM

## 2023-03-31 DIAGNOSIS — E1141 Type 2 diabetes mellitus with diabetic mononeuropathy: Secondary | ICD-10-CM | POA: Diagnosis present

## 2023-03-31 DIAGNOSIS — R911 Solitary pulmonary nodule: Secondary | ICD-10-CM | POA: Diagnosis present

## 2023-03-31 DIAGNOSIS — J069 Acute upper respiratory infection, unspecified: Secondary | ICD-10-CM

## 2023-03-31 DIAGNOSIS — F0670 Mild neurocognitive disorder due to known physiological condition without behavioral disturbance: Secondary | ICD-10-CM | POA: Diagnosis present

## 2023-03-31 DIAGNOSIS — M353 Polymyalgia rheumatica: Secondary | ICD-10-CM | POA: Diagnosis present

## 2023-03-31 DIAGNOSIS — R4189 Other symptoms and signs involving cognitive functions and awareness: Secondary | ICD-10-CM | POA: Insufficient documentation

## 2023-03-31 DIAGNOSIS — Z8616 Personal history of COVID-19: Secondary | ICD-10-CM | POA: Diagnosis not present

## 2023-03-31 DIAGNOSIS — Z66 Do not resuscitate: Secondary | ICD-10-CM | POA: Diagnosis present

## 2023-03-31 DIAGNOSIS — Z7984 Long term (current) use of oral hypoglycemic drugs: Secondary | ICD-10-CM

## 2023-03-31 DIAGNOSIS — N400 Enlarged prostate without lower urinary tract symptoms: Secondary | ICD-10-CM | POA: Diagnosis present

## 2023-03-31 DIAGNOSIS — Z820 Family history of epilepsy and other diseases of the nervous system: Secondary | ICD-10-CM

## 2023-03-31 DIAGNOSIS — I77810 Thoracic aortic ectasia: Secondary | ICD-10-CM | POA: Diagnosis present

## 2023-03-31 DIAGNOSIS — Z833 Family history of diabetes mellitus: Secondary | ICD-10-CM

## 2023-03-31 DIAGNOSIS — E785 Hyperlipidemia, unspecified: Secondary | ICD-10-CM | POA: Diagnosis present

## 2023-03-31 DIAGNOSIS — Z9049 Acquired absence of other specified parts of digestive tract: Secondary | ICD-10-CM

## 2023-03-31 DIAGNOSIS — R509 Fever, unspecified: Principal | ICD-10-CM

## 2023-03-31 HISTORY — DX: Sepsis, unspecified organism: R65.20

## 2023-03-31 LAB — I-STAT CG4 LACTIC ACID, ED
Lactic Acid, Venous: 2.3 mmol/L (ref 0.5–1.9)
Lactic Acid, Venous: 2.5 mmol/L (ref 0.5–1.9)
Lactic Acid, Venous: 2 mmol/L (ref 0.5–1.9)

## 2023-03-31 LAB — CBC WITH DIFFERENTIAL/PLATELET
Abs Immature Granulocytes: 0.14 10*3/uL — ABNORMAL HIGH (ref 0.00–0.07)
Abs Immature Granulocytes: 0.15 10*3/uL — ABNORMAL HIGH (ref 0.00–0.07)
Basophils Absolute: 0 10*3/uL (ref 0.0–0.1)
Basophils Absolute: 0 10*3/uL (ref 0.0–0.1)
Basophils Relative: 0 %
Basophils Relative: 0 %
Eosinophils Absolute: 0 10*3/uL (ref 0.0–0.5)
Eosinophils Absolute: 0 10*3/uL (ref 0.0–0.5)
Eosinophils Relative: 0 %
Eosinophils Relative: 0 %
HCT: 42.4 % (ref 39.0–52.0)
HCT: 38.7 % — ABNORMAL LOW (ref 39.0–52.0)
Hemoglobin: 14.4 g/dL (ref 13.0–17.0)
Hemoglobin: 13.6 g/dL (ref 13.0–17.0)
Immature Granulocytes: 1 %
Immature Granulocytes: 1 %
Lymphocytes Relative: 3 %
Lymphocytes Relative: 3 %
Lymphs Abs: 0.4 10*3/uL — ABNORMAL LOW (ref 0.7–4.0)
Lymphs Abs: 0.5 10*3/uL — ABNORMAL LOW (ref 0.7–4.0)
MCH: 31 pg (ref 26.0–34.0)
MCH: 32 pg (ref 26.0–34.0)
MCHC: 34 g/dL (ref 30.0–36.0)
MCHC: 35.1 g/dL (ref 30.0–36.0)
MCV: 91.2 fL (ref 80.0–100.0)
MCV: 91.1 fL (ref 80.0–100.0)
Monocytes Absolute: 0.4 10*3/uL (ref 0.1–1.0)
Monocytes Absolute: 0.5 10*3/uL (ref 0.1–1.0)
Monocytes Relative: 3 %
Monocytes Relative: 3 %
Neutro Abs: 14.8 10*3/uL — ABNORMAL HIGH (ref 1.7–7.7)
Neutro Abs: 15.8 10*3/uL — ABNORMAL HIGH (ref 1.7–7.7)
Neutrophils Relative %: 93 %
Neutrophils Relative %: 93 %
Platelets: 218 10*3/uL (ref 150–400)
Platelets: 212 10*3/uL (ref 150–400)
RBC: 4.65 MIL/uL (ref 4.22–5.81)
RBC: 4.25 MIL/uL (ref 4.22–5.81)
RDW: 12.7 % (ref 11.5–15.5)
RDW: 12.9 % (ref 11.5–15.5)
WBC: 15.8 10*3/uL — ABNORMAL HIGH (ref 4.0–10.5)
WBC: 17 10*3/uL — ABNORMAL HIGH (ref 4.0–10.5)
nRBC: 0 % (ref 0.0–0.2)
nRBC: 0 % (ref 0.0–0.2)

## 2023-03-31 LAB — COMPREHENSIVE METABOLIC PANEL
ALT: 21 U/L (ref 0–44)
AST: 17 U/L (ref 15–41)
Albumin: 4.2 g/dL (ref 3.5–5.0)
Alkaline Phosphatase: 60 U/L (ref 38–126)
Anion gap: 14 (ref 5–15)
BUN: 13 mg/dL (ref 8–23)
CO2: 24 mmol/L (ref 22–32)
Calcium: 9.4 mg/dL (ref 8.9–10.3)
Chloride: 102 mmol/L (ref 98–111)
Creatinine, Ser: 1.16 mg/dL (ref 0.61–1.24)
GFR, Estimated: >60 mL/min (ref >60)
Glucose, Bld: 281 mg/dL — ABNORMAL HIGH (ref 70–99)
Potassium: 4.3 mmol/L (ref 3.5–5.1)
Sodium: 140 mmol/L (ref 135–145)
Total Bilirubin: 1.3 mg/dL — ABNORMAL HIGH (ref <1.2)
Total Protein: 7.2 g/dL (ref 6.5–8.1)

## 2023-03-31 LAB — URINALYSIS, W/ REFLEX TO CULTURE (INFECTION SUSPECTED)
Bacteria, UA: NONE SEEN
Bilirubin Urine: NEGATIVE
Glucose, UA: >=500 mg/dL — AB
Hgb urine dipstick: NEGATIVE
Ketones, ur: 20 mg/dL — AB
Leukocytes,Ua: NEGATIVE
Nitrite: NEGATIVE
Protein, ur: NEGATIVE mg/dL
Specific Gravity, Urine: 1.032 — ABNORMAL HIGH (ref 1.005–1.030)
pH: 7 (ref 5.0–8.0)

## 2023-03-31 LAB — RESP PANEL BY RT-PCR (RSV, FLU A&B, COVID)  RVPGX2
Influenza A by PCR: NEGATIVE
Influenza B by PCR: NEGATIVE
Resp Syncytial Virus by PCR: NEGATIVE
SARS Coronavirus 2 by RT PCR: NEGATIVE

## 2023-03-31 LAB — CBG MONITORING, ED: Glucose-Capillary: 224 mg/dL — ABNORMAL HIGH (ref 70–99)

## 2023-03-31 LAB — HIV ANTIBODY (ROUTINE TESTING W REFLEX): HIV Screen 4th Generation wRfx: NONREACTIVE

## 2023-03-31 LAB — PROTIME-INR
INR: 1.1 (ref 0.8–1.2)
Prothrombin Time: 14.4 s (ref 11.4–15.2)

## 2023-03-31 LAB — GLUCOSE, CAPILLARY
Glucose-Capillary: 115 mg/dL — ABNORMAL HIGH (ref 70–99)
Glucose-Capillary: 165 mg/dL — ABNORMAL HIGH (ref 70–99)
Glucose-Capillary: 194 mg/dL — ABNORMAL HIGH (ref 70–99)

## 2023-03-31 LAB — APTT: aPTT: 23 s — ABNORMAL LOW (ref 24–36)

## 2023-03-31 MED ORDER — LACTATED RINGERS IV SOLN: INTRAVENOUS | Status: DC

## 2023-03-31 MED ORDER — MAGNESIUM HYDROXIDE 400 MG/5ML PO SUSP: 30.0000 mL | Freq: Every day | ORAL | Status: DC | PRN

## 2023-03-31 MED ORDER — SODIUM CHLORIDE 0.9 % IV SOLN: INTRAVENOUS | Status: DC

## 2023-03-31 MED ORDER — INSULIN GLARGINE-YFGN 100 UNIT/ML ~~LOC~~ SOLN
8.0000 [IU] | Freq: Every day | SUBCUTANEOUS | Status: DC
  Administered 2023-03-31 – 2023-04-01 (×2): 8 [IU] via SUBCUTANEOUS
  Filled 2023-03-31 (×3): qty 0.08

## 2023-03-31 MED ORDER — VANCOMYCIN HCL 1750 MG/350ML IV SOLN: 1750.0000 mg | INTRAVENOUS | Status: DC

## 2023-03-31 MED ORDER — DONEPEZIL HCL 10 MG PO TABS
10.0000 mg | ORAL_TABLET | Freq: Every day | ORAL | Status: DC
  Administered 2023-04-01: 10 mg via ORAL
  Filled 2023-03-31: qty 1

## 2023-03-31 MED ORDER — METRONIDAZOLE 500 MG/100ML IV SOLN
500.0000 mg | Freq: Once | INTRAVENOUS | Status: AC
  Administered 2023-03-31: 500 mg via INTRAVENOUS
  Filled 2023-03-31: qty 100

## 2023-03-31 MED ORDER — ACETAMINOPHEN 325 MG PO TABS: 650.0000 mg | ORAL_TABLET | Freq: Four times a day (QID) | ORAL | Status: DC | PRN

## 2023-03-31 MED ORDER — IOHEXOL 350 MG/ML SOLN
75.0000 mL | Freq: Once | INTRAVENOUS | Status: AC | PRN
  Administered 2023-03-31: 75 mL via INTRAVENOUS

## 2023-03-31 MED ORDER — SODIUM CHLORIDE 0.9 % IV SOLN
2.0000 g | Freq: Once | INTRAVENOUS | Status: AC
  Administered 2023-03-31: 2 g via INTRAVENOUS
  Filled 2023-03-31: qty 12.5

## 2023-03-31 MED ORDER — SODIUM CHLORIDE 0.9 % IV SOLN: 2.0000 g | INTRAVENOUS | Status: DC

## 2023-03-31 MED ORDER — VANCOMYCIN HCL 1.5 G IV SOLR
1500.0000 mg | INTRAVENOUS | Status: DC
  Filled 2023-03-31: qty 30

## 2023-03-31 MED ORDER — VANCOMYCIN HCL 1750 MG/350ML IV SOLN
1750.0000 mg | Freq: Once | INTRAVENOUS | Status: AC
  Administered 2023-03-31: 1750 mg via INTRAVENOUS
  Filled 2023-03-31: qty 350

## 2023-03-31 MED ORDER — ONDANSETRON HCL 4 MG/2ML IJ SOLN: 4.0000 mg | Freq: Four times a day (QID) | INTRAMUSCULAR | Status: DC | PRN

## 2023-03-31 MED ORDER — ONDANSETRON HCL 4 MG PO TABS: 4.0000 mg | ORAL_TABLET | Freq: Four times a day (QID) | ORAL | Status: DC | PRN

## 2023-03-31 MED ORDER — INSULIN ASPART 100 UNIT/ML IJ SOLN
0.0000 [IU] | INTRAMUSCULAR | Status: DC
  Administered 2023-03-31: 2 [IU] via SUBCUTANEOUS
  Administered 2023-03-31: 3 [IU] via SUBCUTANEOUS
  Administered 2023-04-01 (×2): 2 [IU] via SUBCUTANEOUS
  Administered 2023-04-01: 1 [IU] via SUBCUTANEOUS
  Administered 2023-04-01: 2 [IU] via SUBCUTANEOUS
  Administered 2023-04-02: 5 [IU] via SUBCUTANEOUS
  Administered 2023-04-02 (×2): 1 [IU] via SUBCUTANEOUS

## 2023-03-31 MED ORDER — ROSUVASTATIN CALCIUM 20 MG PO TABS
20.0000 mg | ORAL_TABLET | Freq: Every day | ORAL | Status: DC
  Administered 2023-04-02: 20 mg via ORAL
  Filled 2023-03-31 (×2): qty 1

## 2023-03-31 MED ORDER — IRBESARTAN 150 MG PO TABS
150.0000 mg | ORAL_TABLET | Freq: Every day | ORAL | Status: DC
  Administered 2023-04-02: 150 mg via ORAL
  Filled 2023-03-31 (×3): qty 1

## 2023-03-31 MED ORDER — ENOXAPARIN SODIUM 40 MG/0.4ML IJ SOSY
40.0000 mg | PREFILLED_SYRINGE | INTRAMUSCULAR | Status: DC
  Administered 2023-03-31: 40 mg via SUBCUTANEOUS
  Filled 2023-03-31: qty 0.4

## 2023-03-31 MED ORDER — SODIUM CHLORIDE 0.9 % IV SOLN
2.0000 g | Freq: Two times a day (BID) | INTRAVENOUS | Status: DC
  Administered 2023-03-31: 2 g via INTRAVENOUS
  Filled 2023-03-31 (×2): qty 12.5

## 2023-03-31 MED ORDER — ACETAMINOPHEN 650 MG RE SUPP
650.0000 mg | Freq: Four times a day (QID) | RECTAL | Status: DC | PRN
  Administered 2023-03-31: 650 mg via RECTAL
  Filled 2023-03-31: qty 1

## 2023-03-31 MED ORDER — VANCOMYCIN HCL IN DEXTROSE 1-5 GM/200ML-% IV SOLN
1000.0000 mg | Freq: Once | INTRAVENOUS | Status: DC
  Filled 2023-03-31: qty 200

## 2023-03-31 MED ORDER — SODIUM CHLORIDE 0.9 % IV BOLUS
1000.0000 mL | Freq: Once | INTRAVENOUS | Status: AC
  Administered 2023-03-31: 1000 mL via INTRAVENOUS

## 2023-03-31 NOTE — ED Notes (Signed)
Lab called, hemolyzed CBC sample

## 2023-03-31 NOTE — ED Notes (Signed)
[facility] called by MD Armanda Magic, RN in CT 1 with pt

## 2023-03-31 NOTE — Progress Notes (Signed)
[facility]link following for sepsis protocol. 

## 2023-03-31 NOTE — ED Notes (Signed)
Phlebotomy at bedside to obtain second set of cultures.

## 2023-03-31 NOTE — ED Notes (Signed)
Dr. Rhae Hammock at bedside

## 2023-03-31 NOTE — Progress Notes (Signed)
ED Pharmacy Antibiotic Sign Off An antibiotic consult was received from an ED provider for vancomycin and cefepime per pharmacy dosing for sepsis. A chart review was completed to assess appropriateness.   The following one time order(s) were placed:  Cefepime 2 g IV x 1 Vancomycin 1750 mg IV x 1   Further antibiotic and/or antibiotic pharmacy consults should be ordered by the admitting provider if indicated.   Thank you for allowing pharmacy to be a part of this patient's care.   Griffin Dakin, [facility]  Clinical Pharmacist 03/31/23 12:27 PM

## 2023-03-31 NOTE — ED Notes (Signed)
Got patient into a gown on there monitor did EKG shown to er provider patient is resting with nurse and family at bedside

## 2023-03-31 NOTE — Progress Notes (Signed)
Pharmacy Antibiotic Note  Walter [name] is a 82 y.o. male admitted on 03/31/2023 with sepsis.  Pharmacy has been consulted for vanco dosing.  Plan: Vancomycin 1750 mg x1 (given in ED 11/29 1400) followed by 1500 mg IV every 24 hours.  eAUC 505, scr 1.16, Vd 0.72 L/kg, IBW Cefepime 2 grams iv q12h- MD ordered Monitor for opportunities to de-escalate, changes in renal function, and vanco levels once at steady-state  Height: 6' (182.9 cm) Weight: 84 kg (185 lb 3 oz) IBW/kg (Calculated) : 77.6  Temp (24hrs), Avg:98.8 F (37.1 C), Min:98.8 F (37.1 C), Max:98.8 F (37.1 C)  Recent Labs  Lab 03/31/23 1150 03/31/23 1214 03/31/23 1428 03/31/23 1640 03/31/23 1650  WBC 15.8*  --   --  17.0*  --   CREATININE 1.16  --   --   --   --   LATICACIDVEN  --  2.3* 2.5*  --  2.0*    Estimated Creatinine Clearance: 53.9 mL/min (by C-G formula based on SCr of 1.16 mg/dL).    Allergies  Allergen Reactions   Bactrim [Sulfamethoxazole-Trimethoprim] Nausea And Vomiting    Pt has chills and fevers also.       Thank you for allowing pharmacy to be a part of this patient's care.  Greta Doom BS, PharmD, BCPS Clinical Pharmacist 03/31/2023 5:06 PM  Contact: [phone] after 3 PM  "Be curious, not judgmental..." -Debbora Dus

## 2023-03-31 NOTE — ED Notes (Signed)
Sent CBC to lab and collected istat lactic at 1640

## 2023-03-31 NOTE — ED Notes (Signed)
MD Tee pinched right upper arm, patient able to move right arm

## 2023-03-31 NOTE — ED Triage Notes (Addendum)
Patient arrives via [facility] for altered mental status, fever, and weakness. Upon EMS arrival, assessment of right sided neglect and patient is nonverbal as of today, positive LVO with EMS. LKW last night. Patient has been sick since 11/13 after COVID and flu vaccine, reaction to vaccine with slow decline per family. Family reports congestion and sneezing over past day. Urinary incontinence as of today, hx of mild dementia but usually alert to self and situation. Hx of HTN and DM.   18 LAC.  EMS vitals 102.3 temp BP 164/87 CO2 35 RR 22 88% on room air, placed on 5L at 94%

## 2023-03-31 NOTE — ED Notes (Signed)
PT had 1 wet brief, the NT and primary RN changed him and repositioned him in the bed.

## 2023-03-31 NOTE — ED Notes (Signed)
ED TO INPATIENT HANDOFF REPORT  ED Nurse Name and Phone #: Rodney Booze [phone]  S Name/Age/Gender Walter [name] 82 y.o. male Room/Bed: 037C/037C  Code Status   Code Status: Limited: Do not attempt resuscitation (DNR) -DNR-LIMITED -Do Not Intubate/DNI   Home/SNF/Other Home Patient oriented to: self  Is this baseline? No   Triage Complete: Triage complete  Chief Complaint Sepsis [facility]) [A41.9]  Triage Note Patient arrives via [facility] for altered mental status, fever, and weakness. Upon EMS arrival, assessment of right sided neglect and patient is nonverbal as of today, positive LVO with EMS. LKW last night. Patient has been sick since 11/13 after COVID and flu vaccine, reaction to vaccine with slow decline per family. Family reports congestion and sneezing over past day. Urinary incontinence as of today, hx of mild dementia but usually alert to self and situation. Hx of HTN and DM.   18 LAC.  EMS vitals 102.3 temp BP 164/87 CO2 35 RR 22 88% on room air, placed on 5L at 94%   Allergies Allergies  Allergen Reactions   Bactrim [Sulfamethoxazole-Trimethoprim] Nausea And Vomiting    Pt has chills and fevers also.    Level of Care/Admitting Diagnosis ED Disposition     ED Disposition  Admit   Condition  --   Comment  Hospital Area: [facility] [[id number]]  Level of Care: Med-Surg [16]  May admit patient to [facility] or [facility] if equivalent level of care is available:: Yes  Covid Evaluation: Confirmed COVID Negative  Diagnosis: Sepsis [facility]) [[id number]]  Admitting Physician: Sharlene Dory [[id number]]  Attending Physician: Sharlene Dory [phone]  Certification:: I certify this patient will need inpatient services for at least 2 midnights  Expected Medical Readiness: 04/03/2023          B Medical/Surgery History Past Medical History:  Diagnosis Date   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 All started after a hemorrhoid surgery and later with urinary retention. Patient at one point was septic from urinary issues.     Diabetes mellitus type II, controlled 04/23/2007    Metformin 1g BID, amaryl 4mg --> 8mg , had to add [place] back  Never took victoza.   Januvia-lethargic and dizzy in past. 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 Follows with Dr. 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   Pulmonary nodule 02/04/2022   Sepsis secondary to UTI 03/29/2015   Past Surgical History:  Procedure Laterality Date   AMPUTATION TOE Right 09/14/2016   Procedure: RIGHT 2ND TOE/RAY AMPUTATION;  Surgeon: Tarry Kos, MD;  Location: [facility];  Service: Orthopedics;  Laterality: Right;   APPENDECTOMY  05/03/1947   BIOPSY  01/28/2022   Procedure: BIOPSY;   Surgeon: Sherrilyn Rist, MD;  Location: [facility] ENDOSCOPY;  Service: Gastroenterology;;   COLECTOMY  05/02/1990   CYSTOSCOPY N/A 06/21/2015   Procedure: CYSTOSCOPY, CLOT EVACUATION, AND CAUTERIZATION OF PROSTATE FOSSA;  Surgeon: Jethro Bolus, MD;  Location: [facility]S;  Service: Urology;  Laterality: N/A;   ESOPHAGOGASTRODUODENOSCOPY (EGD) WITH PROPOFOL N/A 01/26/2022   Procedure: ESOPHAGOGASTRODUODENOSCOPY (EGD) WITH PROPOFOL;  Surgeon: Sherrilyn Rist, MD;  Location: [facility] ENDOSCOPY;  Service: Gastroenterology;  Laterality: N/A;   ESOPHAGOGASTRODUODENOSCOPY (EGD) WITH PROPOFOL N/A 01/28/2022   Procedure: ESOPHAGOGASTRODUODENOSCOPY (EGD) WITH PROPOFOL;  Surgeon: Sherrilyn Rist, MD;  Location: [facility] ENDOSCOPY;  Service: Gastroenterology;  Laterality: N/A;   HEMORRHOID SURGERY     HEMOSTASIS CLIP PLACEMENT  01/26/2022   Procedure: HEMOSTASIS CLIP PLACEMENT;  Surgeon: Sherrilyn Rist, MD;  Location: [facility] ENDOSCOPY;  Service: Gastroenterology;;   HEMOSTASIS CONTROL  01/26/2022   Procedure: HEMOSTASIS CONTROL;  Surgeon: Sherrilyn Rist, MD;  Location: [facility] ENDOSCOPY;  Service: Gastroenterology;;   HOT HEMOSTASIS N/A 01/26/2022   Procedure: HOT HEMOSTASIS (ARGON PLASMA COAGULATION/BICAP);  Surgeon: Sherrilyn Rist, MD;  Location: [facility] ENDOSCOPY;  Service: Gastroenterology;  Laterality: N/A;   MOLE REMOVAL  2022   hand   SCLEROTHERAPY  01/26/2022   Procedure: SCLEROTHERAPY;  Surgeon: Sherrilyn Rist, MD;  Location: [facility] ENDOSCOPY;  Service: Gastroenterology;;   TOE SURGERY  04/2020   TRANSURETHRAL RESECTION OF PROSTATE N/A 05/23/2015   Procedure: TRANSURETHRAL RESECTION OF THE PROSTATE (TURP);  Surgeon: Bjorn Pippin, MD;  Location: [facility];  Service: Urology;  Laterality: N/A;     A IV Location/Drains/Wounds Patient Lines/Drains/Airways Status     Active Line/Drains/Airways     Name Placement date Placement time Site Days   Peripheral IV 03/31/23 18 G Left Antecubital 03/31/23  1130   Antecubital  less than 1   Peripheral IV 03/31/23 20 G Right Antecubital 03/31/23  1158  Antecubital  less than 1            Intake/Output Last 24 hours  Intake/Output Summary (Last 24 hours) at 03/31/2023 1710 Last data filed at 03/31/2023 1630 Gross per 24 hour  Intake 350 ml  Output --  Net 350 ml    Labs/Imaging Results for orders placed or performed during the hospital encounter of 03/31/23 (from the past 48 hour(s))  Comprehensive metabolic panel     Status: Abnormal   Collection Time: 03/31/23 11:50 AM  Result Value Ref Range   Sodium 140 135 - 145 mmol/L   Potassium 4.3 3.5 - 5.1 mmol/L   Chloride 102 98 - 111 mmol/L   CO2 24 22 - 32 mmol/L   Glucose, Bld 281 (H) 70 - 99 mg/dL    Comment: Glucose reference range applies only to samples taken after fasting for at least 8 hours.   BUN 13 8 - 23 mg/dL   Creatinine, Ser 1.61 0.61 - 1.24 mg/dL   Calcium 9.4 8.9 - 09.6 mg/dL   Total Protein 7.2 6.5 - 8.1 g/dL   Albumin 4.2 3.5 -  5.0 g/dL   AST 17 15 - 41 U/L   ALT 21 0 - 44 U/L   Alkaline Phosphatase 60 38 - 126 U/L   Total Bilirubin 1.3 (H) <1.2 mg/dL   GFR, Estimated >96 >04 mL/min    Comment: (NOTE) Calculated using the CKD-EPI Creatinine Equation (2021)    Anion gap 14 5 - 15    Comment: Performed at [facility], [address], [place], [place] [address]  CBC with Differential     Status: Abnormal   Collection Time: 03/31/23 11:50 AM  Result Value Ref Range   WBC 15.8 (H) 4.0 - 10.5 K/uL   RBC 4.65 4.22 - 5.81 MIL/uL   Hemoglobin 14.4 13.0 - 17.0 g/dL   HCT 11.9 14.7 - 82.9 %   MCV 91.2 80.0 - 100.0 fL   MCH 31.0 26.0 - 34.0 pg   MCHC 34.0 30.0 - 36.0 g/dL   RDW 56.2 13.0 - 86.5 %   Platelets 218 150 - 400 K/uL   nRBC 0.0 0.0 - 0.2 %   Neutrophils Relative % 93 %   Neutro Abs 14.8 (H) 1.7 - 7.7 K/uL   Lymphocytes Relative 3 %   Lymphs Abs 0.4 (L) 0.7 - 4.0 K/uL   Monocytes Relative 3 %   Monocytes Absolute 0.4 0.1 - 1.0 K/uL   Eosinophils  Relative 0 %   Eosinophils Absolute 0.0 0.0 - 0.5 K/uL   Basophils Relative 0 %   Basophils Absolute 0.0 0.0 - 0.1 K/uL   Immature Granulocytes 1 %   Abs Immature Granulocytes 0.14 (H) 0.00 - 0.07 K/uL    Comment: Performed at [facility], [address] [place], [place] [address]  Protime-INR     Status: None   Collection Time: 03/31/23 11:50 AM  Result Value Ref Range   Prothrombin Time 14.4 11.4 - 15.2 seconds   INR 1.1 0.8 - 1.2    Comment: (NOTE) INR goal varies based on device and disease states. Performed at [facility], [address], [place], [place] [address]   APTT     Status: Abnormal   Collection Time: 03/31/23 11:50 AM  Result Value Ref Range   aPTT 23 (L) 24 - 36 seconds    Comment: Performed at [facility], [address], [place], [place] [address]  Resp panel by RT-PCR (RSV, Flu A&B, Covid) Anterior Nasal Swab     Status: None   Collection Time: 03/31/23 12:04 PM   Specimen: Anterior Nasal Swab  Result Value Ref Range   SARS Coronavirus 2 by RT PCR NEGATIVE NEGATIVE   Influenza A by PCR NEGATIVE NEGATIVE   Influenza B by PCR NEGATIVE NEGATIVE    Comment: (NOTE) The Xpert Xpress SARS-CoV-2/FLU/RSV plus assay is intended as an aid in the diagnosis of influenza from Nasopharyngeal swab specimens and should not be used as a sole basis for treatment. Nasal washings and aspirates are unacceptable for Xpert Xpress SARS-CoV-2/FLU/RSV testing.  Fact Sheet for Patients: BloggerCourse.com  Fact Sheet for Healthcare Providers: SeriousBroker.it  This test is not yet approved or cleared by the [place] FDA and has been authorized for detection and/or diagnosis of SARS-CoV-2 by FDA under an Emergency Use Authorization (EUA). This EUA will remain in effect (meaning this test can be used) for the duration of the COVID-19 declaration under Section 564(b)(1) of the Act, 21 U.S.C. section  360bbb-3(b)(1), unless the authorization is terminated or revoked.  Resp Syncytial Virus by PCR NEGATIVE NEGATIVE    Comment: (NOTE) Fact Sheet for Patients: BloggerCourse.com  Fact Sheet for Healthcare Providers: SeriousBroker.it  This test is not yet approved or cleared by the [place] FDA and has been authorized for detection and/or diagnosis of SARS-CoV-2 by FDA under an Emergency Use Authorization (EUA). This EUA will remain in effect (meaning this test can be used) for the duration of the COVID-19 declaration under Section 564(b)(1) of the Act, 21 U.S.C. section 360bbb-3(b)(1), unless the authorization is terminated or revoked.  Performed at [facility], [address] [place], [place] [address]   I-Stat Lactic Acid, ED     Status: Abnormal   Collection Time: 03/31/23 12:14 PM  Result Value Ref Range   Lactic Acid, Venous 2.3 (HH) 0.5 - 1.9 mmol/L   Comment NOTIFIED PHYSICIAN   Urinalysis, w/ Reflex to Culture (Infection Suspected) -Urine, Clean Catch     Status: Abnormal   Collection Time: 03/31/23  1:32 PM  Result Value Ref Range   Specimen Source URINE, CLEAN CATCH    Color, Urine STRAW (A) YELLOW   APPearance CLEAR CLEAR   Specific Gravity, Urine 1.032 (H) 1.005 - 1.030   pH 7.0 5.0 - 8.0   Glucose, UA >=500 (A) NEGATIVE mg/dL   Hgb urine dipstick NEGATIVE NEGATIVE   Bilirubin Urine NEGATIVE NEGATIVE   Ketones, ur 20 (A) NEGATIVE mg/dL   Protein, ur NEGATIVE NEGATIVE mg/dL   Nitrite NEGATIVE NEGATIVE   Leukocytes,Ua NEGATIVE NEGATIVE   RBC / HPF 0-5 0 - 5 RBC/hpf   WBC, UA 0-5 0 - 5 WBC/hpf    Comment:        Reflex urine culture not performed if WBC <=10, OR if Squamous epithelial cells >5. If Squamous epithelial cells >5 suggest recollection.    Bacteria, UA NONE SEEN NONE SEEN   Squamous Epithelial / HPF 0-5 0 - 5 /HPF    Comment: Performed at [facility], [address]  [place], [place] [address]  I-Stat Lactic Acid, ED     Status: Abnormal   Collection Time: 03/31/23  2:28 PM  Result Value Ref Range   Lactic Acid, Venous 2.5 (HH) 0.5 - 1.9 mmol/L   Comment NOTIFIED PHYSICIAN   CBC with Differential/Platelet     Status: Abnormal   Collection Time: 03/31/23  4:40 PM  Result Value Ref Range   WBC 17.0 (H) 4.0 - 10.5 K/uL   RBC 4.25 4.22 - 5.81 MIL/uL   Hemoglobin 13.6 13.0 - 17.0 g/dL   HCT 09.8 (L) 11.9 - 14.7 %   MCV 91.1 80.0 - 100.0 fL   MCH 32.0 26.0 - 34.0 pg   MCHC 35.1 30.0 - 36.0 g/dL   RDW 82.9 56.2 - 13.0 %   Platelets 212 150 - 400 K/uL   nRBC 0.0 0.0 - 0.2 %   Neutrophils Relative % 93 %   Neutro Abs 15.8 (H) 1.7 - 7.7 K/uL   Lymphocytes Relative 3 %   Lymphs Abs 0.5 (L) 0.7 - 4.0 K/uL   Monocytes Relative 3 %   Monocytes Absolute 0.5 0.1 - 1.0 K/uL   Eosinophils Relative 0 %   Eosinophils Absolute 0.0 0.0 - 0.5 K/uL   Basophils Relative 0 %   Basophils Absolute 0.0 0.0 - 0.1 K/uL   Immature Granulocytes 1 %   Abs Immature Granulocytes 0.15 (H) 0.00 - 0.07 K/uL    Comment: Performed at [facility],  [address] [place], [place] [address]  I-Stat CG4 Lactic Acid     Status: Abnormal   Collection Time: 03/31/23  4:50 PM  Result Value Ref Range   Lactic Acid, Venous 2.0 (HH) 0.5 - 1.9 mmol/L   Comment NOTIFIED PHYSICIAN    CT Chest Wo Contrast  Result Date: 03/31/2023 CLINICAL DATA:  Respiratory illness.  Previous lung nodule. EXAM: CT CHEST WITHOUT CONTRAST TECHNIQUE: Multidetector CT imaging of the chest was performed following the standard protocol without IV contrast. RADIATION DOSE REDUCTION: This exam was performed according to the departmental dose-optimization program which includes automated exposure control, adjustment of the mA and/or kV according to patient size and/or use of iterative reconstruction technique. COMPARISON:  PET-CT scan June 2024.  X-ray earlier 03/31/2023. FINDINGS: Cardiovascular: Heart is  nonenlarged. Trace pericardial fluid. Coronary artery calcifications are seen. The thoracic aorta overall has a normal course caliber with scattered vascular calcifications on this noncontrast examination. Ascending aorta however has a diameter of 4.1 cm. There is some prominence of the pulmonary arterial tree as well Mediastinum/Nodes: Right-sided thyroid nodule identified. Slightly patulous thoracic esophagus. No discrete abnormal lymph node enlargement identified in the axillary regions, hilum or mediastinum on this noncontrast examination. Lungs/Pleura: Extensive breathing motion throughout the examination. No pneumothorax or effusion. There is some mild patchy opacities along the lung bases. Atelectasis is favored over infiltrate but recommend follow-up. Previous nodule in the medial left lung base is again seen but poorly defined with motion. Today diameter approaches 12 mm and is unchanged from the previous exam recommend continued follow up in 1 year as previously described. Upper Abdomen: Adrenal glands are preserved in the upper abdomen. There is contrast seen in the renal collecting systems consistent with previous CT angiogram. There is also a cystic lesion along the head of the pancreas again measuring 4.1 x 2.4 cm. Again recommend follow up in 2 years. Musculoskeletal: Stable areas sclerosis along the left humeral head. Mild degenerative changes along the spine. IMPRESSION: Mild parenchymal opacities along bases. Atelectasis is favored over infiltrate recommend follow-up. 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 Recommend annual imaging followup by CTA or MRA. This recommendation follows 2010 ACCF/AHA/AATS/ACR/ASA/SCA/SCAI/SIR/STS/SVM Guidelines for the Diagnosis and Management of Patients with Thoracic Aortic Disease. Circulation. 2010; 121: U045-W098. Aortic aneurysm NOS (ICD10-I71.9) 12 mm stable left lower lobe lung nodule. Based on prior workup would recommend continued follow  up surveillance in 1 year. Cystic area along the pancreas once again seen. As per prior recommend follow up imaging in 1-2 years. Small cystic nodule along the right thyroid lobe as seen on the CTA. Recommend thyroid ultrasound when clinically appropriate Aortic Atherosclerosis (ICD10-I70.0). Electronically Signed   By: Karen Kays M.D.   On: 03/31/2023 14:27   CT ANGIO HEAD NECK W WO CM  Result Date: 03/31/2023 CLINICAL DATA:  Neuro deficit, acute, stroke suspected EXAM: CT ANGIOGRAPHY HEAD AND NECK WITH AND WITHOUT CONTRAST TECHNIQUE: Multidetector CT imaging of the head and neck was performed using the standard protocol during bolus administration of intravenous contrast. Multiplanar CT image reconstructions and MIPs were obtained to evaluate the vascular anatomy. Carotid stenosis measurements (when applicable) are obtained utilizing NASCET criteria, using the distal internal carotid diameter as the denominator. RADIATION DOSE REDUCTION: This exam was performed according to the departmental dose-optimization program which includes automated exposure control, adjustment of the mA and/or kV according to patient size and/or use of iterative reconstruction technique. CONTRAST:  75mL OMNIPAQUE IOHEXOL 350 MG/ML SOLN  COMPARISON:  MRI October 30, 2022. FINDINGS: CT HEAD FINDINGS Brain: No evidence of acute infarction, hemorrhage, hydrocephalus, extra-axial collection or mass lesion/mass effect. Cerebral atrophy. Vascular: See below. Skull: No acute fracture. Sinuses/Orbits: Mostly clear sinuses.  No acute orbital findings. Other: No mastoid effusions. Review of the MIP images confirms the above findings CTA NECK FINDINGS Aortic arch: Aortic atherosclerosis. Great vessel origins are patent. Right carotid system: Atherosclerosis at the carotid bifurcation without greater than 50% stenosis. Left carotid system: Atherosclerosis at the carotid bifurcation without greater than 50% stenosis. Vertebral arteries: Moderate  right vertebral artery origin stenosis due to atherosclerosis. Left vertebral arteries patent without significant stenosis. Right dominant. Skeleton: No acute fracture. Other neck: Approximately 1.6 cm right thyroid nodule. Extensive calcifications within the tonsils, presumably related to prior infection/infection. Upper chest: Visualized lung apices are clear. Review of the MIP images confirms the above findings CTA HEAD FINDINGS Anterior circulation: Bilateral intracranial ICAs, MCAs, ACAs are patent without proximal hemodynamically significant stenosis. Mild-to-moderate bilateral intracranial ICA stenosis. Posterior circulation: Mild left intradural vertebral artery stenosis with prominent left PICA. Both intradural vertebral arteries, basilar artery and bilateral posterior cerebral arteries are patent without proximal hemodynamically significant stenosis. Venous sinuses: Not well assessed due to arterial timing. Review of the MIP images confirms the above findings IMPRESSION: 1. No evidence of acute large vascular territory infarct or acute hemorrhage. 2. No emergent large vessel occlusion or proximal hemodynamically significant stenosis. 3. Approximately 1.6 cm right thyroid nodule. Recommend thyroid US (ref: J Am Coll Radiol. 2015 Feb;12(2): 143-50). 4.  Aortic Atherosclerosis (ICD10-I70.0). Electronically Signed   By: Feliberto Harts M.D.   On: 03/31/2023 12:49   DG Chest Port 1 View  Result Date: 03/31/2023 CLINICAL DATA:  Questionable sepsis.  Evaluate for abnormality. EXAM: PORTABLE CHEST 1 VIEW COMPARISON:  Radiographs 10/09/2015.  PET-CT 10/28/2022. FINDINGS: 1219 hours. The heart size and mediastinal contours are stable with aortic atherosclerosis. There is mild atelectasis or scarring at both lung bases, but no confluent airspace disease, pleural effusion or pneumothorax. No acute osseous findings are evident. Telemetry leads overlie the chest. IMPRESSION: Mild bibasilar atelectasis or  scarring. No evidence of pneumonia. Electronically Signed   By: Carey Bullocks M.D.   On: 03/31/2023 12:30    Pending Labs Unresulted Labs (From admission, onward)     Start     Ordered   04/07/23 0500  Creatinine, serum  (enoxaparin (LOVENOX)    CrCl >/= 30 ml/min)  Weekly,   R     Comments: while on enoxaparin therapy    03/31/23 1626   04/01/23 0500  Basic metabolic panel  Tomorrow morning,   R        03/31/23 1626   04/01/23 0500  CBC  Tomorrow morning,   R        03/31/23 1626   03/31/23 1700  HIV Antibody (routine testing w rflx)  Once,   R        03/31/23 1700   03/31/23 1624  CBC  (enoxaparin (LOVENOX)    CrCl >/= 30 ml/min)  Once,   R       Comments: Baseline for enoxaparin therapy IF NOT ALREADY DRAWN.  Notify MD if PLT < 100 K.    03/31/23 1626   03/31/23 1427  CSF cell count with differential collection tube #: 1  (Meningitis Panel)  Once,   STAT       Question:  collection tube #  Answer:  1   03/31/23 1426  03/31/23 1427  CSF cell count with differential collection tube #: 4  (Meningitis Panel)  Once,   STAT       Question:  collection tube #  Answer:  4   03/31/23 1426   03/31/23 1427  CSF culture w Gram Stain  (Meningitis Panel)  Once,   URGENT       Question:  Are there also cytology or pathology orders on this specimen?  Answer:  Yes   03/31/23 1426   03/31/23 1427  Protein and glucose, CSF  (Meningitis Panel)  Once,   STAT        03/31/23 1426   03/31/23 1427  Comprehensive metabolic panel  (Meningitis Panel)  Once,   STAT        03/31/23 1426   03/31/23 1427  Culture, blood (Routine X 2)  (Meningitis Panel)  BLOOD CULTURE X 2,   R (with STAT occurrences)      03/31/23 1426   03/31/23 1204  Blood Culture (routine x 2)  (Septic presentation on arrival (screening labs, nursing and treatment orders for obvious sepsis))  BLOOD CULTURE X 2,   STAT      03/31/23 1206            Vitals/Pain Today's Vitals   03/31/23 1231 03/31/23 1245 03/31/23 1600 03/31/23  1700  BP:   (!) 145/85 136/67  Pulse:  83 78 77  Resp:  19 (!) 25 (!) 27  Temp: 98.8 F (37.1 C)     TempSrc: Oral     SpO2:  95% 96% 92%  Weight:      Height:        Isolation Precautions No active isolations  Medications Medications  lactated ringers infusion ( Intravenous New Bag/Given 03/31/23 1221)  insulin glargine-yfgn (SEMGLEE) injection 8 Units (has no administration in time range)  enoxaparin (LOVENOX) injection 40 mg (has no administration in time range)  insulin aspart (novoLOG) injection 0-9 Units (has no administration in time range)  0.9 %  sodium chloride infusion (has no administration in time range)  acetaminophen (TYLENOL) tablet 650 mg (has no administration in time range)    Or  acetaminophen (TYLENOL) suppository 650 mg (has no administration in time range)  magnesium hydroxide (MILK OF MAGNESIA) suspension 30 mL (has no administration in time range)  ondansetron (ZOFRAN) tablet 4 mg (has no administration in time range)    Or  ondansetron (ZOFRAN) injection 4 mg (has no administration in time range)  ceFEPIme (MAXIPIME) 2 g in sodium chloride 0.9 % 100 mL IVPB (has no administration in time range)  rosuvastatin (CRESTOR) tablet 20 mg (has no administration in time range)  irbesartan (AVAPRO) tablet 150 mg (has no administration in time range)  donepezil (ARICEPT) tablet 10 mg (has no administration in time range)  Vancomycin (VANCOCIN) 1,500 mg in sodium chloride 0.9 % 500 mL IVPB (has no administration in time range)  ceFEPIme (MAXIPIME) 2 g in sodium chloride 0.9 % 100 mL IVPB (0 g Intravenous Stopped 03/31/23 1253)  metroNIDAZOLE (FLAGYL) IVPB 500 mg (0 mg Intravenous Stopped 03/31/23 1356)  iohexol (OMNIPAQUE) 350 MG/ML injection 75 mL (75 mLs Intravenous Contrast Given 03/31/23 1218)  vancomycin (VANCOREADY) IVPB 1750 mg/350 mL (0 mg Intravenous Stopped 03/31/23 1630)  sodium chloride 0.9 % bolus 1,000 mL (1,000 mLs Intravenous New Bag/Given 03/31/23  1422)    Mobility Walks but not currently     Focused Assessments Cardiac Assessment Handoff:    No results found for: "CKTOTAL", "  CKMB", "CKMBINDEX", "TROPONINI" No results found for: "DDIMER" Does the Patient currently have chest pain? No    R Recommendations: See Admitting Provider Note  Report given to:   Additional Notes:

## 2023-03-31 NOTE — ED Notes (Signed)
Unable to obtain CSF tubes during LP by MD Tee due to unsuccessful attempt.

## 2023-03-31 NOTE — H&P (Addendum)
History and Physical    Walter [name] FAO:[id number] DOB: 1941/04/10 DOA: 03/31/2023  PCP: Shelva Majestic, MD  Patient coming from: Home  Chief Complaint: Non-responsive  HPI: Walter [name] is a 82 y.o. male with medical history significant of mild cognitive impairment, hyperlipidemia, diabetes type 2, hypertension who presents with altered mental status.  His daughter and wife are at bedside.  The patient woke up this morning gurgling.  His wife could not arouse him and he was brought to the emergency department.  He has been coughing.  He was not complaining of shortness of breath.  On 03/15/2023, he had both a flu shot and the COVID vaccination.  His mentation has steadily declined since then.  He did have a fever early on and that resolved along with many of his symptoms.  His wife reports he was never back to baseline.  The patient is not speaking nor is he answering questions appropriately.  He does not follow commands.  ED Course: Mild elevation of lactic acid of 1.8, trending up to 2.52 hours later.  Leukocytosis of 15.8.  X-ray showed atelectasis, CT showed lower lung infiltrates favoring atelectasis versus pneumonia.  He was started on cefepime, vancomycin, and Flagyl.  It also incidentally showed a stable 12 mm lung nodule and right thyroid lobe nodule.  Lung nodule to be followed up on in 1 year, thyroid ultrasound recommended for the thyroid nodule when convenient.  LP was attempted but not successful by the EDP.  Review of Systems: A reliable review of systems was not able to be obtained.  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 All started  after a hemorrhoid surgery and later with urinary retention. Patient at one point was septic from urinary issues.     Diabetes mellitus type II, controlled 04/23/2007    Metformin 1g BID, amaryl 4mg --> 8mg , had to add [place] back  Never took victoza.   Januvia-lethargic and dizzy in past. 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 Follows with Dr. 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   Pulmonary nodule 02/04/2022   Sepsis secondary to UTI 03/29/2015    Past Surgical History:  Procedure Laterality Date   AMPUTATION TOE Right 09/14/2016  Procedure: RIGHT 2ND TOE/RAY AMPUTATION;  Surgeon: Tarry Kos, MD;  Location: [facility];  Service: Orthopedics;  Laterality: Right;   APPENDECTOMY  05/03/1947   BIOPSY  01/28/2022   Procedure: BIOPSY;  Surgeon: Sherrilyn Rist, MD;  Location: [facility] ENDOSCOPY;  Service: Gastroenterology;;   COLECTOMY  05/02/1990   CYSTOSCOPY N/A 06/21/2015   Procedure: CYSTOSCOPY, CLOT EVACUATION, AND CAUTERIZATION OF PROSTATE FOSSA;  Surgeon: Jethro Bolus, MD;  Location: [facility];  Service:  Urology;  Laterality: N/A;   ESOPHAGOGASTRODUODENOSCOPY (EGD) WITH PROPOFOL N/A 01/26/2022   Procedure: ESOPHAGOGASTRODUODENOSCOPY (EGD) WITH PROPOFOL;  Surgeon: Sherrilyn Rist, MD;  Location: [facility] ENDOSCOPY;  Service: Gastroenterology;  Laterality: N/A;   ESOPHAGOGASTRODUODENOSCOPY (EGD) WITH PROPOFOL N/A 01/28/2022   Procedure: ESOPHAGOGASTRODUODENOSCOPY (EGD) WITH PROPOFOL;  Surgeon: Sherrilyn Rist, MD;  Location: [facility]COPY;  Service: Gastroenterology;  Laterality: N/A;   HEMORRHOID SURGERY     HEMOSTASIS CLIP PLACEMENT  01/26/2022   Procedure: HEMOSTASIS CLIP PLACEMENT;  Surgeon: Sherrilyn Rist, MD;  Location: [facility] ENDOSCOPY;  Service: Gastroenterology;;   HEMOSTASIS CONTROL  01/26/2022   Procedure: HEMOSTASIS CONTROL;  Surgeon: Sherrilyn Rist, MD;  Location: [facility] ENDOSCOPY;  Service: Gastroenterology;;   HOT HEMOSTASIS N/A 01/26/2022   Procedure: HOT HEMOSTASIS (ARGON PLASMA COAGULATION/BICAP);  Surgeon: Sherrilyn Rist, MD;  Location: [facility] ENDOSCOPY;  Service: Gastroenterology;  Laterality: N/A;   MOLE REMOVAL  2022   hand   SCLEROTHERAPY  01/26/2022   Procedure: SCLEROTHERAPY;  Surgeon: Sherrilyn Rist, MD;  Location: [facility] ENDOSCOPY;  Service: Gastroenterology;;   TOE SURGERY  04/2020   TRANSURETHRAL RESECTION OF PROSTATE N/A 05/23/2015   Procedure: TRANSURETHRAL RESECTION OF THE PROSTATE (TURP);  Surgeon: Bjorn Pippin, MD;  Location: [facility];  Service: Urology;  Laterality: N/A;     reports that he has quit smoking. His smoking use included pipe and cigarettes. He has a 2.5 pack-year smoking history. He has never used smokeless tobacco. He reports current alcohol use of about 2.0 standard drinks of alcohol per week. He reports that he does not use drugs.  Allergies  Allergen Reactions   Bactrim [Sulfamethoxazole-Trimethoprim] Nausea And Vomiting    Pt has chills and fevers also.    Family History  Problem Relation Age of Onset   Alzheimer's disease Mother    Colon cancer  Father 70   Diabetes Father        type I   Colon cancer Paternal Grandmother 61   Rectal cancer Maternal Uncle    Cancer Other        colon/fhx   Esophageal cancer Neg Hx    Stomach cancer Neg Hx     Prior to Admission medications   Medication Sig Start Date End Date Taking? Authorizing Provider  amLODipine (NORVASC) 5 MG tablet TAKE 1 TABLET BY MOUTH EVERY DAY 01/10/23  Yes Shelva Majestic, MD  [employer] STOMACH RELIEF 262 MG chewable tablet Chew 2 tablets by mouth 4 (four) times daily. 02/07/22  Yes [provider]  donepezil (ARICEPT) 10 MG tablet TAKE 1 TABLET BY MOUTH EVERYDAY AT BEDTIME Patient taking differently: Take 10 mg by mouth at bedtime. 12/14/22  Yes Van Clines, MD  ferrous sulfate 325 (65 FE) MG tablet Take 1 tablet (325 mg total) by mouth 2 (two) times daily with a meal. 01/29/22  Yes Narda Bonds, MD  glipiZIDE (GLUCOTROL) 10 MG tablet TAKE 1 TABLET (10 MG TOTAL) BY MOUTH TWICE A DAY  BEFORE A MEAL 05/05/22  Yes Shelva Majestic, MD  Glucagon (GVOKE HYPOPEN 2-PACK) 0.5 MG/0.1ML SOAJ Inject 0.5 mg into the skin daily as needed (for low blood sugar). 02/04/22  Yes Shelva Majestic, MD  metFORMIN (GLUCOPHAGE) 1000 MG tablet TAKE 1 TABLET BY MOUTH TWICE A DAY WITH A MEAL 01/10/23  Yes Shelva Majestic, MD  rosuvastatin (CRESTOR) 20 MG tablet TAKE 1 TABLET BY MOUTH ONE TIME PER WEEK Patient taking differently: Take 20 mg by mouth daily. Sunday Night 06/21/22  Yes Shelva Majestic, MD  sitaGLIPtin (JANUVIA) 100 MG tablet Take 1 tablet (100 mg total) by mouth daily. 04/21/22  Yes Shelva Majestic, MD  telmisartan (MICARDIS) 80 MG tablet Take 1 tablet (80 mg total) by mouth daily. 10/06/22  Yes Shelva Majestic, MD  [facility] ULTRA test strip USE ONCE DAILY AS INSTRUCTED 09/01/22   Shelva Majestic, MD    Physical Exam: Vitals:   03/31/23 1230 03/31/23 1231 03/31/23 1245 03/31/23 1600  BP: 113/76   (!) 145/85  Pulse: 87  83 78  Resp: 16  19 (!) 25  Temp:  98.8 F  (37.1 C)    TempSrc:  Oral    SpO2: 95%  95% 96%  Weight:      Height:        Constitutional: NAD Eyes: PERRL, lids and conjunctivae normal ENMT: Mucous membranes are moist. Posterior pharynx clear of any exudate or lesions. Neck: normal, supple, no masses, no thyromegaly Respiratory: clear to auscultation bilaterally, no wheezing, no crackles. Normal respiratory effort. No accessory muscle use.  Cardiovascular: RRR. No extremity edema. 2+ pedal pulses. No carotid bruits.  Abdomen: no tenderness, no masses palpated. No hepatosplenomegaly. Bowel sounds positive.  Musculoskeletal: no clubbing / cyanosis. No joint deformity upper and lower extremities. Good ROM, no contractures. Normal muscle tone.  Skin: no rashes, lesions, ulcers on exposed skin surfaces.  Neurologic: Negative Brudzinski and Kernig's.  Appears to freely move extremities. Psychiatric: Unable to discern  Labs on Admission: I have personally reviewed following labs and imaging studies  CBC: Recent Labs  Lab 03/31/23 1150  WBC 15.8*  NEUTROABS 14.8*  HGB 14.4  HCT 42.4  MCV 91.2  PLT 218   Basic Metabolic Panel: Recent Labs  Lab 03/31/23 1150  NA 140  K 4.3  CL 102  CO2 24  GLUCOSE 281*  BUN 13  CREATININE 1.16  CALCIUM 9.4   GFR: Estimated Creatinine Clearance: 53.9 mL/min (by C-G formula based on SCr of 1.16 mg/dL).  Liver Function Tests: Recent Labs  Lab 03/31/23 1150  AST 17  ALT 21  ALKPHOS 60  BILITOT 1.3*  PROT 7.2  ALBUMIN 4.2   Coagulation Profile: Recent Labs  Lab 03/31/23 1150  INR 1.1   Urine analysis:    Component Value Date/Time   COLORURINE STRAW (A) 03/31/2023 1332   APPEARANCEUR CLEAR 03/31/2023 1332   LABSPEC 1.032 (H) 03/31/2023 1332   PHURINE 7.0 03/31/2023 1332   GLUCOSEU >=500 (A) 03/31/2023 1332   HGBUR NEGATIVE 03/31/2023 1332   BILIRUBINUR NEGATIVE 03/31/2023 1332   KETONESUR 20 (A) 03/31/2023 1332   PROTEINUR NEGATIVE 03/31/2023 1332   NITRITE NEGATIVE  03/31/2023 1332   LEUKOCYTESUR NEGATIVE 03/31/2023 1332   Recent Results (from the past 240 hour(s))  Resp panel by RT-PCR (RSV, Flu A&B, Covid) Anterior Nasal Swab     Status: None   Collection Time: 03/31/23 12:04 PM   Specimen: Anterior Nasal Swab  Result Value Ref  Range Status   SARS Coronavirus 2 by RT PCR NEGATIVE NEGATIVE Final   Influenza A by PCR NEGATIVE NEGATIVE Final   Influenza B by PCR NEGATIVE NEGATIVE Final    Comment: (NOTE) The Xpert Xpress SARS-CoV-2/FLU/RSV plus assay is intended as an aid in the diagnosis of influenza from Nasopharyngeal swab specimens and should not be used as a sole basis for treatment. Nasal washings and aspirates are unacceptable for Xpert Xpress SARS-CoV-2/FLU/RSV testing.  Fact Sheet for Patients: BloggerCourse.com  Fact Sheet for Healthcare Providers: SeriousBroker.it  This test is not yet approved or cleared by the [place] FDA and has been authorized for detection and/or diagnosis of SARS-CoV-2 by FDA under an Emergency Use Authorization (EUA). This EUA will remain in effect (meaning this test can be used) for the duration of the COVID-19 declaration under Section 564(b)(1) of the Act, 21 U.S.C. section 360bbb-3(b)(1), unless the authorization is terminated or revoked.     Resp Syncytial Virus by PCR NEGATIVE NEGATIVE Final    Comment: (NOTE) Fact Sheet for Patients: BloggerCourse.com  Fact Sheet for Healthcare Providers: SeriousBroker.it  This test is not yet approved or cleared by the [place] FDA and has been authorized for detection and/or diagnosis of SARS-CoV-2 by FDA under an Emergency Use Authorization (EUA). This EUA will remain in effect (meaning this test can be used) for the duration of the COVID-19 declaration under Section 564(b)(1) of the Act, 21 U.S.C. section 360bbb-3(b)(1), unless the authorization is  terminated or revoked.  Performed at [facility], [address], [place], [place] [address]      Radiological Exams on Admission: CT Chest Wo Contrast  Result Date: 03/31/2023 CLINICAL DATA:  Respiratory illness.  Previous lung nodule. EXAM: CT CHEST WITHOUT CONTRAST TECHNIQUE: Multidetector CT imaging of the chest was performed following the standard protocol without IV contrast. RADIATION DOSE REDUCTION: This exam was performed according to the departmental dose-optimization program which includes automated exposure control, adjustment of the mA and/or kV according to patient size and/or use of iterative reconstruction technique. COMPARISON:  PET-CT scan June 2024.  X-ray earlier 03/31/2023. FINDINGS: Cardiovascular: Heart is nonenlarged. Trace pericardial fluid. Coronary artery calcifications are seen. The thoracic aorta overall has a normal course caliber with scattered vascular calcifications on this noncontrast examination. Ascending aorta however has a diameter of 4.1 cm. There is some prominence of the pulmonary arterial tree as well Mediastinum/Nodes: Right-sided thyroid nodule identified. Slightly patulous thoracic esophagus. No discrete abnormal lymph node enlargement identified in the axillary regions, hilum or mediastinum on this noncontrast examination. Lungs/Pleura: Extensive breathing motion throughout the examination. No pneumothorax or effusion. There is some mild patchy opacities along the lung bases. Atelectasis is favored over infiltrate but recommend follow-up. Previous nodule in the medial left lung base is again seen but poorly defined with motion. Today diameter approaches 12 mm and is unchanged from the previous exam recommend continued follow up in 1 year as previously described. Upper Abdomen: Adrenal glands are preserved in the upper abdomen. There is contrast seen in the renal collecting systems consistent with previous CT angiogram. There is also a cystic lesion  along the head of the pancreas again measuring 4.1 x 2.4 cm. Again recommend follow up in 2 years. Musculoskeletal: Stable areas sclerosis along the left humeral head. Mild degenerative changes along the spine. IMPRESSION: Mild parenchymal opacities along bases. Atelectasis is favored over infiltrate recommend follow-up. 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 Recommend annual imaging followup by  CTA or MRA. This recommendation follows 2010 ACCF/AHA/AATS/ACR/ASA/SCA/SCAI/SIR/STS/SVM Guidelines for the Diagnosis and Management of Patients with Thoracic Aortic Disease. Circulation. 2010; 121: Z610-R604. Aortic aneurysm NOS (ICD10-I71.9) 12 mm stable left lower lobe lung nodule. Based on prior workup would recommend continued follow up surveillance in 1 year. Cystic area along the pancreas once again seen. As per prior recommend follow up imaging in 1-2 years. Small cystic nodule along the right thyroid lobe as seen on the CTA. Recommend thyroid ultrasound when clinically appropriate Aortic Atherosclerosis (ICD10-I70.0). Electronically Signed   By: Karen Kays M.D.   On: 03/31/2023 14:27   CT ANGIO HEAD NECK W WO CM  Result Date: 03/31/2023 CLINICAL DATA:  Neuro deficit, acute, stroke suspected EXAM: CT ANGIOGRAPHY HEAD AND NECK WITH AND WITHOUT CONTRAST TECHNIQUE: Multidetector CT imaging of the head and neck was performed using the standard protocol during bolus administration of intravenous contrast. Multiplanar CT image reconstructions and MIPs were obtained to evaluate the vascular anatomy. Carotid stenosis measurements (when applicable) are obtained utilizing NASCET criteria, using the distal internal carotid diameter as the denominator. RADIATION DOSE REDUCTION: This exam was performed according to the departmental dose-optimization program which includes automated exposure control, adjustment of the mA and/or kV according to patient size and/or use of iterative reconstruction  technique. CONTRAST:  75mL OMNIPAQUE IOHEXOL 350 MG/ML SOLN COMPARISON:  MRI October 30, 2022. FINDINGS: CT HEAD FINDINGS Brain: No evidence of acute infarction, hemorrhage, hydrocephalus, extra-axial collection or mass lesion/mass effect. Cerebral atrophy. Vascular: See below. Skull: No acute fracture. Sinuses/Orbits: Mostly clear sinuses.  No acute orbital findings. Other: No mastoid effusions. Review of the MIP images confirms the above findings CTA NECK FINDINGS Aortic arch: Aortic atherosclerosis. Great vessel origins are patent. Right carotid system: Atherosclerosis at the carotid bifurcation without greater than 50% stenosis. Left carotid system: Atherosclerosis at the carotid bifurcation without greater than 50% stenosis. Vertebral arteries: Moderate right vertebral artery origin stenosis due to atherosclerosis. Left vertebral arteries patent without significant stenosis. Right dominant. Skeleton: No acute fracture. Other neck: Approximately 1.6 cm right thyroid nodule. Extensive calcifications within the tonsils, presumably related to prior infection/infection. Upper chest: Visualized lung apices are clear. Review of the MIP images confirms the above findings CTA HEAD FINDINGS Anterior circulation: Bilateral intracranial ICAs, MCAs, ACAs are patent without proximal hemodynamically significant stenosis. Mild-to-moderate bilateral intracranial ICA stenosis. Posterior circulation: Mild left intradural vertebral artery stenosis with prominent left PICA. Both intradural vertebral arteries, basilar artery and bilateral posterior cerebral arteries are patent without proximal hemodynamically significant stenosis. Venous sinuses: Not well assessed due to arterial timing. Review of the MIP images confirms the above findings IMPRESSION: 1. No evidence of acute large vascular territory infarct or acute hemorrhage. 2. No emergent large vessel occlusion or proximal hemodynamically significant stenosis. 3. Approximately 1.6  cm right thyroid nodule. Recommend thyroid US (ref: J Am Coll Radiol. 2015 Feb;12(2): 143-50). 4.  Aortic Atherosclerosis (ICD10-I70.0). Electronically Signed   By: Feliberto Harts M.D.   On: 03/31/2023 12:49   DG Chest Port 1 View  Result Date: 03/31/2023 CLINICAL DATA:  Questionable sepsis.  Evaluate for abnormality. EXAM: PORTABLE CHEST 1 VIEW COMPARISON:  Radiographs 10/09/2015.  PET-CT 10/28/2022. FINDINGS: 1219 hours. The heart size and mediastinal contours are stable with aortic atherosclerosis. There is mild atelectasis or scarring at both lung bases, but no confluent airspace disease, pleural effusion or pneumothorax. No acute osseous findings are evident. Telemetry leads overlie the chest. IMPRESSION: Mild bibasilar atelectasis or scarring. No evidence of pneumonia. Electronically  Signed   By: Carey Bullocks M.D.   On: 03/31/2023 12:30    EKG: Independently reviewed.   Assessment/Plan Principal Problem:   Acute encephalopathy  Hopefully secondary to pneumonia versus viral URI  Vancomycin and cefepime daily, stop Flagyl  Based off of his exam, I do not think we need to pursue a lumbar puncture at this time.  Monitor labs  IVF's NS 75 mL/hr  Active Problems:   Lactic acidosis  Trending down      Sepsis (HCC), possible 2/2 pneumonia  Monitor labs  Blood culture pending  Antibiotics and fluids as above    Diabetes mellitus type II, controlled  Currently n.p.o. due to mental status  Correction insulin along with 8 units of Lantus nightly for now  Hold home meds    Essential hypertension  Emergency department blood pressures noted.  Will hold Norvasc, continue Avapro (home [employer] changed formulary inpatient) 150 mg daily    Mild neurocognitive disorder, concerns for Alzheimer's disease  Continue Aricept 10 mg nightly    Pulmonary nodule  Will require follow-up outpatient     Thyroid nodule  Will require outpatient thyroid ultrasound, please make note on discharge  summary  DVT prophylaxis: Lovenox Code Status: DNR Family Communication: Daughter and wife bedside Disposition Plan: Home hopefully Consults called: None Admission status: Inpatient  Severity of Illness: The appropriate patient status for this patient is INPATIENT. Inpatient status is judged to be reasonable and necessary in order to provide the required intensity of service to ensure the patient's safety. The patient's presenting symptoms, physical exam findings, and initial radiographic and laboratory data in the context of their chronic comorbidities is felt to place them at high risk for further clinical deterioration. Furthermore, it is not anticipated that the patient will be medically stable for discharge from the hospital within 2 midnights of admission.   * I certify that at the point of admission it is my clinical judgment that the patient will require inpatient hospital care spanning beyond 2 midnights from the point of admission due to high intensity of service, high risk for further deterioration and high frequency of surveillance required.*  Sharlene Dory, DO [facility] [email or web address] 03/31/2023, 4:49 PM

## 2023-03-31 NOTE — ED Provider Notes (Signed)
[facility]GENC[facility] DEPARTMENT AT [facility] Provider Note   CSN: [id number] Arrival date & time: 03/31/23  1141     History  Chief Complaint  Patient presents with   Altered Mental Status    Olson Fackrell is a 82 y.o. male.  82 year old male with past medical history of diabetes, hypertension, hyperlipidemia, and dementia presenting to the emergency department today with altered mental status.  The patient's family states that he has been having worsening confusion over the past 3 to 4 days.  They report that he was able to ambulate last night but with a slightly unsteady gait.  They state that when they went in this morning to wake him up the patient was unable to ambulate.  He was sent to the ER at that time for further evaluation.  The patient apparently did get COVID and flu vaccines earlier this week and they report that he has had some confusion since then.  They state that he did have some sneezing this morning.  They report that he has had a lot of nasal congestion over the past few days.  He has had minimal cough.  Initially, medics were concerned for a right-sided gaze preference and reported the patient was not moving his right side.   Altered Mental Status      Home Medications Prior to Admission medications   Medication Sig Start Date End Date Taking? Authorizing Provider  amLODipine (NORVASC) 5 MG tablet TAKE 1 TABLET BY MOUTH EVERY DAY 01/10/23  Yes Shelva Majestic, MD  [employer] STOMACH RELIEF 262 MG chewable tablet Chew 2 tablets by mouth 4 (four) times daily. 02/07/22  Yes [provider]  donepezil (ARICEPT) 10 MG tablet TAKE 1 TABLET BY MOUTH EVERYDAY AT BEDTIME 12/14/22   Van Clines, MD  ferrous sulfate 325 (65 FE) MG tablet Take 1 tablet (325 mg total) by mouth 2 (two) times daily with a meal. 01/29/22   Narda Bonds, MD  glipiZIDE (GLUCOTROL) 10 MG tablet TAKE 1 TABLET (10 MG TOTAL) BY MOUTH TWICE A DAY BEFORE A MEAL 05/05/22   Shelva Majestic, MD  Glucagon (GVOKE HYPOPEN 2-PACK) 0.5 MG/0.1ML SOAJ Inject 0.5 mg into the skin daily as needed (for low blood sugar). 02/04/22   Shelva Majestic, MD  metFORMIN (GLUCOPHAGE) 1000 MG tablet TAKE 1 TABLET BY MOUTH TWICE A DAY WITH A MEAL 01/10/23   Shelva Majestic, MD  [facility] ULTRA test strip USE ONCE DAILY AS INSTRUCTED 09/01/22   Shelva Majestic, MD  pantoprazole (PROTONIX) 40 MG tablet Take 1 tablet (40 mg total) by mouth 2 (two) times daily. 01/29/22 10/06/22  Narda Bonds, MD  rosuvastatin (CRESTOR) 20 MG tablet TAKE 1 TABLET BY MOUTH ONE TIME PER WEEK 06/21/22   Shelva Majestic, MD  sitaGLIPtin (JANUVIA) 100 MG tablet Take 1 tablet (100 mg total) by mouth daily. 04/21/22   Shelva Majestic, MD  telmisartan (MICARDIS) 80 MG tablet Take 1 tablet (80 mg total) by mouth daily. 10/06/22   Shelva Majestic, MD  VITAMIN A PO Take 1 tablet by mouth daily.    [provider]      Allergies    Bactrim [sulfamethoxazole-trimethoprim]    Review of Systems   Review of Systems  Reason unable to perform ROS: Altered mental status/dementia.    Physical Exam Updated Vital Signs BP 113/76   Pulse 83   Temp 98.8 F (37.1 C) (Oral)   Resp 19  Ht 6' (1.829 m)   Wt 84 kg   SpO2 95%   BMI 25.12 kg/m  Physical Exam Vitals and nursing note reviewed.   Gen: NAD Eyes: PERRL, EOMI HEENT: no oropharyngeal swelling Neck: trachea midline Resp: clear to auscultation bilaterally Card: RRR, no murmurs, rubs, or gallops Abd: nontender, nondistended Extremities: no calf tenderness, no edema Vascular: 2+ radial pulses bilaterally, 2+ DP pulses bilaterally Neuro:   Skin: no rashes Psyc: acting appropriately   ED Results / Procedures / Treatments   Labs (all labs ordered are listed, but only abnormal results are displayed) Labs Reviewed  COMPREHENSIVE METABOLIC PANEL - Abnormal; Notable for the following components:      Result Value   Glucose, Bld 281 (*)    Total Bilirubin  1.3 (*)    All other components within normal limits  CBC WITH DIFFERENTIAL/PLATELET - Abnormal; Notable for the following components:   WBC 15.8 (*)    Neutro Abs 14.8 (*)    Lymphs Abs 0.4 (*)    Abs Immature Granulocytes 0.14 (*)    All other components within normal limits  APTT - Abnormal; Notable for the following components:   aPTT 23 (*)    All other components within normal limits  URINALYSIS, W/ REFLEX TO CULTURE (INFECTION SUSPECTED) - Abnormal; Notable for the following components:   Color, Urine STRAW (*)    Specific Gravity, Urine 1.032 (*)    Glucose, UA >=500 (*)    Ketones, ur 20 (*)    All other components within normal limits  I-STAT CG4 LACTIC ACID, ED - Abnormal; Notable for the following components:   Lactic Acid, Venous 2.3 (*)    All other components within normal limits  I-STAT CG4 LACTIC ACID, ED - Abnormal; Notable for the following components:   Lactic Acid, Venous 2.5 (*)    All other components within normal limits  RESP PANEL BY RT-PCR (RSV, FLU A&B, COVID)  RVPGX2  CULTURE, BLOOD (ROUTINE X 2)  CULTURE, BLOOD (ROUTINE X 2)  CSF CULTURE W GRAM STAIN  CULTURE, BLOOD (ROUTINE X 2)  CULTURE, BLOOD (ROUTINE X 2)  PROTIME-INR  CSF CELL COUNT WITH DIFFERENTIAL  CSF CELL COUNT WITH DIFFERENTIAL  PROTEIN AND GLUCOSE, CSF  COMPREHENSIVE METABOLIC PANEL  CBC WITH DIFFERENTIAL/PLATELET  HIV ANTIBODY (ROUTINE TESTING W REFLEX)    EKG None  Radiology CT Chest Wo Contrast  Result Date: 03/31/2023 CLINICAL DATA:  Respiratory illness.  Previous lung nodule. EXAM: CT CHEST WITHOUT CONTRAST TECHNIQUE: Multidetector CT imaging of the chest was performed following the standard protocol without IV contrast. RADIATION DOSE REDUCTION: This exam was performed according to the departmental dose-optimization program which includes automated exposure control, adjustment of the mA and/or kV according to patient size and/or use of iterative reconstruction technique.  COMPARISON:  PET-CT scan June 2024.  X-ray earlier 03/31/2023. FINDINGS: Cardiovascular: Heart is nonenlarged. Trace pericardial fluid. Coronary artery calcifications are seen. The thoracic aorta overall has a normal course caliber with scattered vascular calcifications on this noncontrast examination. Ascending aorta however has a diameter of 4.1 cm. There is some prominence of the pulmonary arterial tree as well Mediastinum/Nodes: Right-sided thyroid nodule identified. Slightly patulous thoracic esophagus. No discrete abnormal lymph node enlargement identified in the axillary regions, hilum or mediastinum on this noncontrast examination. Lungs/Pleura: Extensive breathing motion throughout the examination. No pneumothorax or effusion. There is some mild patchy opacities along the lung bases. Atelectasis is favored over infiltrate but recommend follow-up. Previous nodule in the medial  left lung base is again seen but poorly defined with motion. Today diameter approaches 12 mm and is unchanged from the previous exam recommend continued follow up in 1 year as previously described. Upper Abdomen: Adrenal glands are preserved in the upper abdomen. There is contrast seen in the renal collecting systems consistent with previous CT angiogram. There is also a cystic lesion along the head of the pancreas again measuring 4.1 x 2.4 cm. Again recommend follow up in 2 years. Musculoskeletal: Stable areas sclerosis along the left humeral head. Mild degenerative changes along the spine. IMPRESSION: Mild parenchymal opacities along bases. Atelectasis is favored over infiltrate recommend follow-up. 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 Recommend annual imaging followup by CTA or MRA. This recommendation follows 2010 ACCF/AHA/AATS/ACR/ASA/SCA/SCAI/SIR/STS/SVM Guidelines for the Diagnosis and Management of Patients with Thoracic Aortic Disease. Circulation. 2010; 121: W098-J191. Aortic aneurysm NOS (ICD10-I71.9)  12 mm stable left lower lobe lung nodule. Based on prior workup would recommend continued follow up surveillance in 1 year. Cystic area along the pancreas once again seen. As per prior recommend follow up imaging in 1-2 years. Small cystic nodule along the right thyroid lobe as seen on the CTA. Recommend thyroid ultrasound when clinically appropriate Aortic Atherosclerosis (ICD10-I70.0). Electronically Signed   By: Karen Kays M.D.   On: 03/31/2023 14:27   CT ANGIO HEAD NECK W WO CM  Result Date: 03/31/2023 CLINICAL DATA:  Neuro deficit, acute, stroke suspected EXAM: CT ANGIOGRAPHY HEAD AND NECK WITH AND WITHOUT CONTRAST TECHNIQUE: Multidetector CT imaging of the head and neck was performed using the standard protocol during bolus administration of intravenous contrast. Multiplanar CT image reconstructions and MIPs were obtained to evaluate the vascular anatomy. Carotid stenosis measurements (when applicable) are obtained utilizing NASCET criteria, using the distal internal carotid diameter as the denominator. RADIATION DOSE REDUCTION: This exam was performed according to the departmental dose-optimization program which includes automated exposure control, adjustment of the mA and/or kV according to patient size and/or use of iterative reconstruction technique. CONTRAST:  75mL OMNIPAQUE IOHEXOL 350 MG/ML SOLN COMPARISON:  MRI October 30, 2022. FINDINGS: CT HEAD FINDINGS Brain: No evidence of acute infarction, hemorrhage, hydrocephalus, extra-axial collection or mass lesion/mass effect. Cerebral atrophy. Vascular: See below. Skull: No acute fracture. Sinuses/Orbits: Mostly clear sinuses.  No acute orbital findings. Other: No mastoid effusions. Review of the MIP images confirms the above findings CTA NECK FINDINGS Aortic arch: Aortic atherosclerosis. Great vessel origins are patent. Right carotid system: Atherosclerosis at the carotid bifurcation without greater than 50% stenosis. Left carotid system:  Atherosclerosis at the carotid bifurcation without greater than 50% stenosis. Vertebral arteries: Moderate right vertebral artery origin stenosis due to atherosclerosis. Left vertebral arteries patent without significant stenosis. Right dominant. Skeleton: No acute fracture. Other neck: Approximately 1.6 cm right thyroid nodule. Extensive calcifications within the tonsils, presumably related to prior infection/infection. Upper chest: Visualized lung apices are clear. Review of the MIP images confirms the above findings CTA HEAD FINDINGS Anterior circulation: Bilateral intracranial ICAs, MCAs, ACAs are patent without proximal hemodynamically significant stenosis. Mild-to-moderate bilateral intracranial ICA stenosis. Posterior circulation: Mild left intradural vertebral artery stenosis with prominent left PICA. Both intradural vertebral arteries, basilar artery and bilateral posterior cerebral arteries are patent without proximal hemodynamically significant stenosis. Venous sinuses: Not well assessed due to arterial timing. Review of the MIP images confirms the above findings IMPRESSION: 1. No evidence of acute large vascular territory infarct or acute hemorrhage. 2. No emergent large vessel occlusion or proximal hemodynamically  significant stenosis. 3. Approximately 1.6 cm right thyroid nodule. Recommend thyroid US (ref: J Am Coll Radiol. 2015 Feb;12(2): 143-50). 4.  Aortic Atherosclerosis (ICD10-I70.0). Electronically Signed   By: Feliberto Harts M.D.   On: 03/31/2023 12:49   DG Chest Port 1 View  Result Date: 03/31/2023 CLINICAL DATA:  Questionable sepsis.  Evaluate for abnormality. EXAM: PORTABLE CHEST 1 VIEW COMPARISON:  Radiographs 10/09/2015.  PET-CT 10/28/2022. FINDINGS: 1219 hours. The heart size and mediastinal contours are stable with aortic atherosclerosis. There is mild atelectasis or scarring at both lung bases, but no confluent airspace disease, pleural effusion or pneumothorax. No acute osseous  findings are evident. Telemetry leads overlie the chest. IMPRESSION: Mild bibasilar atelectasis or scarring. No evidence of pneumonia. Electronically Signed   By: Carey Bullocks M.D.   On: 03/31/2023 12:30    Procedures Procedures    Medications Ordered in ED Medications  lactated ringers infusion ( Intravenous New Bag/Given 03/31/23 1221)  vancomycin (VANCOREADY) IVPB 1750 mg/350 mL (1,750 mg Intravenous New Bag/Given 03/31/23 1359)  ceFEPIme (MAXIPIME) 2 g in sodium chloride 0.9 % 100 mL IVPB (0 g Intravenous Stopped 03/31/23 1253)  metroNIDAZOLE (FLAGYL) IVPB 500 mg (0 mg Intravenous Stopped 03/31/23 1356)  iohexol (OMNIPAQUE) 350 MG/ML injection 75 mL (75 mLs Intravenous Contrast Given 03/31/23 1218)  sodium chloride 0.9 % bolus 1,000 mL (1,000 mLs Intravenous New Bag/Given 03/31/23 1422)    ED Course/ Medical Decision Making/ A&P                                 Medical Decision Making 82 year old male with past medical history of diabetes, hypertension, and hyperlipidemia as well as dementia presenting to the emergency department today with fever, URI symptoms, and altered mental status.  I will initiate a sepsis workup on the patient.  Will cover him over the [facility] antibiotics at this time.  On neuroexam the patient will move his right side upper extremity with painful stimuli.  He will not follow commands.  He will not move either of his lower extremities to painful stimuli but he will grimace to pain in the bilateral lower extremities.  The patient went to bed last night around 9 PM.  He is clearly outside the window for thrombolytics at this time.  I will obtain a CT angiogram of his head and neck to evaluate for large vessel occlusion although given the fever and underlying dementia I will hold off on calling a code stroke at this time.  I have made the patient a code medical to expedite having his CT images performed and read.  I will discuss his case with neurology if these are  abnormal.  His pulse ox is 90 to 92% so suspect this may be due to URI at this time.  The patient is a leukocytosis here.  Lactic acid is elevated here mildly.  The patient's x-ray shows atelectasis.  Urinalysis does not show any findings of infection.  CT scan is ordered which shows lower lung infiltrates favoring atelectasis versus pneumonia.  The patient does continue to cough.  With his altered mental status I did attempt to perform an LP here to evaluate for meningitis or encephalitis.  This was unsuccessful.  The patient is covered with broad-spectrum antibiotics.  His CT angiogram does not show any large vessel occlusion.  A call is placed to the hospitalist service for admission.  Amount and/or Complexity of Data Reviewed Labs: ordered.  Radiology: ordered. ECG/medicine tests: ordered.  Risk Prescription drug management. Decision regarding hospitalization.           Final Clinical Impression(s) / ED Diagnoses Final diagnoses:  Fever, unspecified fever cause  Confusion  Upper respiratory tract infection, unspecified type    Rx / DC Orders ED Discharge Orders     None         Durwin Glaze, MD 03/31/23 1523

## 2023-03-31 NOTE — Progress Notes (Signed)
TR[facility] night cross cover note:   RN completed bedside swallow screen this evening per order to do this once patient's mentation has improved.   While the patient's mentation has improved some and he was able to pass this bedside swallow screen, RN conveys that the patient's is still not able to follow all commands and that he remains non-verbal. In this capacity, will plan to continue NPO status for now and to reassess bedside swallow screen and timing of transition away from NPO status in the AM.    Walter Pigg, DO Hospitalist

## 2023-04-01 ENCOUNTER — Inpatient Hospital Stay (HOSPITAL_COMMUNITY): Payer: Medicare Other

## 2023-04-01 DIAGNOSIS — E872 Acidosis, unspecified: Secondary | ICD-10-CM

## 2023-04-01 DIAGNOSIS — G9341 Metabolic encephalopathy: Secondary | ICD-10-CM | POA: Diagnosis not present

## 2023-04-01 DIAGNOSIS — E041 Nontoxic single thyroid nodule: Secondary | ICD-10-CM

## 2023-04-01 DIAGNOSIS — R4189 Other symptoms and signs involving cognitive functions and awareness: Secondary | ICD-10-CM | POA: Insufficient documentation

## 2023-04-01 DIAGNOSIS — I10 Essential (primary) hypertension: Secondary | ICD-10-CM

## 2023-04-01 DIAGNOSIS — A419 Sepsis, unspecified organism: Secondary | ICD-10-CM | POA: Diagnosis not present

## 2023-04-01 DIAGNOSIS — J690 Pneumonitis due to inhalation of food and vomit: Secondary | ICD-10-CM

## 2023-04-01 DIAGNOSIS — E119 Type 2 diabetes mellitus without complications: Secondary | ICD-10-CM

## 2023-04-01 DIAGNOSIS — F03918 Unspecified dementia, unspecified severity, with other behavioral disturbance: Secondary | ICD-10-CM | POA: Insufficient documentation

## 2023-04-01 DIAGNOSIS — R911 Solitary pulmonary nodule: Secondary | ICD-10-CM

## 2023-04-01 DIAGNOSIS — R6520 Severe sepsis without septic shock: Secondary | ICD-10-CM

## 2023-04-01 LAB — CBC
HCT: 36.3 % — ABNORMAL LOW (ref 39.0–52.0)
Hemoglobin: 11.9 g/dL — ABNORMAL LOW (ref 13.0–17.0)
MCH: 30.7 pg (ref 26.0–34.0)
MCHC: 32.8 g/dL (ref 30.0–36.0)
MCV: 93.8 fL (ref 80.0–100.0)
Platelets: 180 10*3/uL (ref 150–400)
RBC: 3.87 MIL/uL — ABNORMAL LOW (ref 4.22–5.81)
RDW: 12.9 % (ref 11.5–15.5)
WBC: 14.8 10*3/uL — ABNORMAL HIGH (ref 4.0–10.5)
nRBC: 0 % (ref 0.0–0.2)

## 2023-04-01 LAB — GLUCOSE, CAPILLARY
Glucose-Capillary: 111 mg/dL — ABNORMAL HIGH (ref 70–99)
Glucose-Capillary: 119 mg/dL — ABNORMAL HIGH (ref 70–99)
Glucose-Capillary: 147 mg/dL — ABNORMAL HIGH (ref 70–99)
Glucose-Capillary: 152 mg/dL — ABNORMAL HIGH (ref 70–99)
Glucose-Capillary: 195 mg/dL — ABNORMAL HIGH (ref 70–99)
Glucose-Capillary: 200 mg/dL — ABNORMAL HIGH (ref 70–99)

## 2023-04-01 LAB — BASIC METABOLIC PANEL
Anion gap: 5 (ref 5–15)
BUN: 13 mg/dL (ref 8–23)
CO2: 25 mmol/L (ref 22–32)
Calcium: 8.4 mg/dL — ABNORMAL LOW (ref 8.9–10.3)
Chloride: 106 mmol/L (ref 98–111)
Creatinine, Ser: 0.84 mg/dL (ref 0.61–1.24)
GFR, Estimated: >60 mL/min (ref >60)
Glucose, Bld: 122 mg/dL — ABNORMAL HIGH (ref 70–99)
Potassium: 3.3 mmol/L — ABNORMAL LOW (ref 3.5–5.1)
Sodium: 136 mmol/L (ref 135–145)

## 2023-04-01 LAB — RESPIRATORY PANEL BY PCR

## 2023-04-01 LAB — PROCALCITONIN: Procalcitonin: <0.1 ng/mL

## 2023-04-01 LAB — VITAMIN B12: Vitamin B-12: 452 pg/mL (ref 180–914)

## 2023-04-01 LAB — LACTIC ACID, PLASMA: Lactic Acid, Venous: 0.9 mmol/L (ref 0.5–1.9)

## 2023-04-01 LAB — TSH: TSH: 1.984 u[IU]/mL (ref 0.350–4.500)

## 2023-04-01 MED ORDER — METRONIDAZOLE 500 MG/100ML IV SOLN
500.0000 mg | Freq: Two times a day (BID) | INTRAVENOUS | Status: DC
  Administered 2023-04-01 – 2023-04-02 (×3): 500 mg via INTRAVENOUS
  Filled 2023-04-01 (×3): qty 100

## 2023-04-01 MED ORDER — SODIUM CHLORIDE 0.9 % IV SOLN
2.0000 g | Freq: Three times a day (TID) | INTRAVENOUS | Status: DC
  Administered 2023-04-01: 2 g via INTRAVENOUS

## 2023-04-01 MED ORDER — VANCOMYCIN HCL IN DEXTROSE 1-5 GM/200ML-% IV SOLN: 1000.0000 mg | Freq: Two times a day (BID) | INTRAVENOUS | Status: DC

## 2023-04-01 MED ORDER — SODIUM CHLORIDE 0.9 % IV SOLN
1.0000 g | INTRAVENOUS | Status: DC
  Administered 2023-04-01: 1 g via INTRAVENOUS
  Filled 2023-04-01: qty 10

## 2023-04-01 MED ORDER — QUETIAPINE FUMARATE 25 MG PO TABS
25.0000 mg | ORAL_TABLET | Freq: Two times a day (BID) | ORAL | Status: DC | PRN
  Administered 2023-04-01: 25 mg via ORAL
  Filled 2023-04-01: qty 1

## 2023-04-01 NOTE — Progress Notes (Signed)
OT Cancellation Note  Patient Details Name: Walter [name] MRN: [id number] DOB: April 24, 1941   Cancelled Treatment:    Reason Eval/Treat Not Completed: Pain limiting ability to participate;Other (comment).  Pt confused with attempts at OT/PT eval.  Pt resistive to all movement and kicked at therapist with attempts to remove covers.  Will check back tomorrow and proceed with eval if pt is appropriate.    Rhenda Oregon OTR/L 04/01/2023, 10:19 AM

## 2023-04-01 NOTE — Progress Notes (Addendum)
Pharmacy Antibiotic Note  Walter [name] is a 82 y.o. male admitted on 03/31/2023 with sepsis.  Pharmacy has been consulted for vanco dosing.  Renal function improved, Scr 0.84 today (11/30). No further doses of vancomycin administered beyond 1750 mg loading dose at 1400 on 11/29.   Plan: Vancomycin 1000 mg IV every 12 hours (eAUC 491, Scr 0.84) Ceftriaxone 1 gm IV every 24 hours - per MD Metronidazole 500 mg IV BID Monitor for opportunities to de-escalate, changes in renal function, and vanco levels once at steady-state  Height: 6' (182.9 cm) Weight: 85.5 kg (188 lb 7.8 oz) IBW/kg (Calculated) : 77.6  Temp (24hrs), Avg:98.5 F (36.9 C), Min:97.9 F (36.6 C), Max:99.4 F (37.4 C)  Recent Labs  Lab 03/31/23 1150 03/31/23 1214 03/31/23 1428 03/31/23 1640 03/31/23 1650 04/01/23 0432 04/01/23 0802  WBC 15.8*  --   --  17.0*  --  14.8*  --   CREATININE 1.16  --   --   --   --  0.84  --   LATICACIDVEN  --  2.3* 2.5*  --  2.0*  --  0.9    Estimated Creatinine Clearance: 74.4 mL/min (by C-G formula based on SCr of 0.84 mg/dL).    Allergies  Allergen Reactions   Bactrim [Sulfamethoxazole-Trimethoprim] Nausea And Vomiting    Pt has chills and fevers also.    Thank you for allowing pharmacy to be a part of this patient's care.  Enos Fling, PharmD PGY-1 Acute Care Pharmacy Resident 04/01/2023 11:42 AM

## 2023-04-01 NOTE — Progress Notes (Signed)
Notified the MD that the patient had an unwitnessed fall. At this time the patient is in the bed with call bell and bed alarm is in place. Patient states he was trying to get up and became unsteady on his feet. Patient states he did not hit his head and is not hurting anywhere at this time.

## 2023-04-01 NOTE — Progress Notes (Signed)
SLP Cancellation Note  Patient Details Name: Walter [name] MRN: [id number] DOB: Sep 24, 1940   Cancelled treatment:        Pt attempted to be seen this AM with PT and OT present. Pt not compliant with rehab staff for simple directions, with the pt attempting to kick the OT. SLP to f/u with increased mentation and as time permits.   Dione Housekeeper M.S. CCC-SLP

## 2023-04-01 NOTE — Progress Notes (Signed)
PT Cancellation Note  Patient Details Name: Walter [name] MRN: [id number] DOB: 1940-11-30   Cancelled Treatment:    Reason Eval/Treat Not Completed: Other (comment) (Pt very confused and agitated. Refusing all therapy and tried to kick staff. Will follow up tomorrow.)   Gladys Damme 04/01/2023, 1:06 PM

## 2023-04-01 NOTE — Progress Notes (Signed)
PROGRESS NOTE  Walter [name] GNF:[id number] DOB: 1940-08-16   PCP: Shelva Majestic, MD  Patient is from: Home.  Lives with family.  DOA: 03/31/2023 LOS: 1  Chief complaints Chief Complaint  Patient presents with   Altered Mental Status     Brief Narrative / Interim history: 82 year old M with PMH of dementia, DM-2, HTN and HLD brought to ED by family due to altered mental status.  Reportedly, he was gurgling and coughing and patient's wife was not able to arouse him easily early in the morning.  Had decline in mentation since his COVID and flu shot on 03/15/2023.   In ED, patient was not speaking or following command.  Vital stable.  WBC 15.8.  CXR concerning for atelectasis.  CT chest concerning for lower lung infiltrates.  CT also showed 12 mm lung nodule and right thyroid lobe nodule.  Lactic acid was 1.8 and trended up to 2.5.  LP was attempted but was not successful in ED.  Cultures obtained.  Started on broad-spectrum antibiotics and admitted for further care.   Subjective: Seen and examined earlier this morning.  No major events overnight.  Patient has no complaints but not a great historian.  He is only oriented to self.  He follows commands.  He had a fall after I left but no injury or focal neurodeficit.  CT head without acute finding.  Objective: Vitals:   03/31/23 1814 03/31/23 2040 04/01/23 0409 04/01/23 0754  BP:  122/60 125/65 128/67  Pulse:  69 (!) 58 (!) 56  Resp:  18 18 18   Temp: 99.4 F (37.4 C) 98 F (36.7 C) 98.5 F (36.9 C) 97.9 F (36.6 C)  TempSrc: Oral  Oral Oral  SpO2:  93% 93% 95%  Weight:   85.5 kg   Height:        Examination:  GENERAL: No apparent distress.  Nontoxic. HEENT: MMM.  Vision and hearing grossly intact.  NECK: Supple.  No apparent JVD.  RESP:  No IWOB.  Fair aeration bilaterally. CVS:  RRR. Heart sounds normal.  ABD/GI/GU: BS+. Abd soft, NTND.  MSK/EXT:  Moves extremities. No apparent deformity. No edema.  SKIN: no apparent  skin lesion or wound NEURO: Awake and alert.  Oriented only to self.  Follows commands.  No apparent focal neuro deficit. PSYCH: Calm. Normal affect.   Procedures:  11/29-LP attempted in ED.  Microbiology summarized: COVID-19, influenza and RSV PCR nonreactive Blood cultures NGTD RVP pending  Assessment and plan: Severe sepsis due to aspiration pneumonia: POA.  Had tachypnea with leukocytosis, lactic acidosis and mental status change on arrival.  Imaging concerning for bilateral lower lobe infiltrates.  Reportedly gurgling and coughing before coming to ED.  COVID-19, influenza and RSV PCR nonreactive.  Blood cultures NGTD.  Lactic acid resolved.  Leukocytosis improved.  No respiratory distress.  Lung exam reassuring. -Check full RVP -De-escalate antibiotic to ceftriaxone and Flagyl. -Will go ahead and feed patient with precaution pending SLP eval.  Discussed with patient's wife.  Acute metabolic encephalopathy/agitation: Multifactorial including sepsis, delirium with underlying dementia.  CT head without acute finding but brain atrophy consistent with dementia.  -Check TSH and B12 -Reorientation and delirium precaution -P.o. Seroquel 25 mg twice daily as needed.  QTc 424.  Discussed with patient's wife. -Fall precaution -Will feed patient with precaution pending SLP eval.  Dementia with behavioral disturbance: Agitated this morning when therapy tried to assess him.. -Continue home Aricept -P.o. Seroquel as above. -Management as above  Fall in  the hospital: Reportedly attempted to get out of the bed to go to bathroom and found on the floor.  No apparent injury or focal neurodeficit.  CT head without acute finding. -Fall precaution -Neurocheck every 4 hours -PT/OT eval  NIDDM-2: A1c 7.4% in 12/2022.  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           DVT prophylaxis:  enoxaparin (LOVENOX) injection 40 mg Start: 03/31/23 1630  Code Status: DNR/DNI Family Communication: Updated patient's wife over the phone. Level of care: Med-Surg Status is: Inpatient Remains inpatient appropriate because: Severe sepsis in setting of aspiration pneumonia, agitation   Final disposition: Likely home once medically stable Consultants:  None  55 minutes with more than 50% spent in reviewing records, counseling patient/family and coordinating care.   Sch Meds:  Scheduled Meds:  donepezil  10 mg Oral QHS   enoxaparin (LOVENOX) injection  40 mg Subcutaneous Q24H   insulin aspart  0-9 Units Subcutaneous Q4H   insulin glargine-yfgn  8 Units Subcutaneous QHS   irbesartan  150 mg Oral Daily   rosuvastatin  20 mg Oral Daily   Continuous Infusions:  sodium chloride Stopped (04/01/23 1042)   cefTRIAXone (ROCEPHIN)  IV     metronidazole 500 mg (04/01/23 0839)   PRN Meds:.acetaminophen **OR** acetaminophen, magnesium hydroxide, ondansetron **OR** ondansetron (ZOFRAN) IV  Antimicrobials: Anti-infectives (From admission, onward)    Start     Dose/Rate Route Frequency Ordered Stop   04/01/23 1400  Vancomycin (VANCOCIN) 1,500 mg in sodium chloride 0.9 % 500 mL IVPB   Status:  Discontinued       Note to Pharmacy: Pharmacy to dose   1,500 mg 250 mL/hr over 120 Minutes Intravenous Every 24 hours 03/31/23 1703 04/01/23 1147   04/01/23 1400  vancomycin (VANCOCIN) IVPB 1000 mg/200 mL premix  Status:  Discontinued        1,000 mg 200 mL/hr over 60 Minutes Intravenous Every 12 hours 04/01/23 1147 04/01/23 1231   04/01/23 1330  cefTRIAXone (ROCEPHIN) 1 g in sodium chloride 0.9 % 100 mL IVPB        1 g 200 mL/hr over 30 Minutes Intravenous Every 24 hours 04/01/23 1231 04/05/23 1329   04/01/23 1200  ceFEPIme (MAXIPIME) 2 g in sodium chloride 0.9 % 100 mL IVPB  Status:  Discontinued        2 g 200 mL/hr over 30 Minutes Intravenous Every 8 hours 04/01/23 1200 04/01/23 1231   04/01/23 0800  metroNIDAZOLE (FLAGYL) IVPB 500 mg        500 mg 100 mL/hr over 60 Minutes Intravenous 2 times daily 04/01/23 0733 04/06/23 0959   04/01/23 0600  vancomycin (VANCOREADY) IVPB 1750 mg/350 mL  Status:  Discontinued       Note to Pharmacy: Pharmacy to dose   1,750 mg 175 mL/hr over 120 Minutes Intravenous Every 24 hours 03/31/23 1644 03/31/23 1703   04/01/23 0600  ceFEPIme (MAXIPIME) 2 g in sodium chloride 0.9 % 100 mL IVPB  Status:  Discontinued        2 g 200 mL/hr over 30 Minutes Intravenous Every 24 hours 03/31/23 1644 03/31/23 1650   03/31/23 2300  ceFEPIme (MAXIPIME) 2 g in sodium chloride 0.9 % 100 mL IVPB  Status:  Discontinued        2 g 200 mL/hr over 30 Minutes Intravenous Every 12 hours 03/31/23 1650 04/01/23 1200   03/31/23 1230  vancomycin (VANCOREADY) IVPB 1750 mg/350 mL        1,750 mg 175 mL/hr over 120 Minutes Intravenous  Once 03/31/23 1226 03/31/23 1630   03/31/23 1215  ceFEPIme (MAXIPIME) 2 g in sodium chloride 0.9 % 100 mL IVPB        2 g 200 mL/hr over 30 Minutes Intravenous  Once 03/31/23 1206 03/31/23 1253   03/31/23 1215  metroNIDAZOLE (FLAGYL) IVPB 500 mg        500 mg 100 mL/hr over 60 Minutes Intravenous  Once 03/31/23 1206 03/31/23 1356    03/31/23 1215  vancomycin (VANCOCIN) IVPB 1000 mg/200 mL premix  Status:  Discontinued        1,000 mg 200 mL/hr over 60 Minutes Intravenous  Once 03/31/23 1206 03/31/23 1226        I have personally reviewed the following labs and images: CBC: Recent Labs  Lab 03/31/23 1150 03/31/23 1640 04/01/23 0432  WBC 15.8* 17.0* 14.8*  NEUTROABS 14.8* 15.8*  --   HGB 14.4 13.6 11.9*  HCT 42.4 38.7* 36.3*  MCV 91.2 91.1 93.8  PLT 218 212 180   BMP &GFR Recent Labs  Lab 03/31/23 1150 04/01/23 0432  NA 140 136  K 4.3 3.3*  CL 102 106  CO2 24 25  GLUCOSE 281* 122*  BUN 13 13  CREATININE 1.16 0.84  CALCIUM 9.4 8.4*   Estimated Creatinine Clearance: 74.4 mL/min (by C-G formula based on SCr of 0.84 mg/dL). Liver & Pancreas: Recent Labs  Lab 03/31/23 1150  AST 17  ALT 21  ALKPHOS 60  BILITOT 1.3*  PROT 7.2  ALBUMIN 4.2   No results for input(s): "LIPASE", "AMYLASE" in the last 168 hours. No results for input(s): "AMMONIA" in the last 168 hours. Diabetic: No results for input(s): "HGBA1C" in the last 72 hours. Recent Labs  Lab 03/31/23 2007 03/31/23 2348 04/01/23 0405 04/01/23 0844 04/01/23 1151  GLUCAP 165* 115* 119* 152* 147*   Cardiac Enzymes: No results for input(s): "CKTOTAL", "CKMB", "CKMBINDEX", "TROPONINI" in the last 168 hours. No results for input(s): "PROBNP" in the last 8760 hours. Coagulation Profile: Recent Labs  Lab 03/31/23 1150  INR 1.1   Thyroid Function Tests: No results for input(s): "TSH", "T4TOTAL", "FREET4", "T3FREE", "THYROIDAB" in the last 72 hours. Lipid Profile: No results for input(s): "CHOL", "HDL", "LDLCALC", "TRIG", "CHOLHDL", "LDLDIRECT" in the last 72 hours. Anemia Panel: No results for input(s): "VITAMINB12", "FOLATE", "FERRITIN", "TIBC", "IRON", "RETICCTPCT" in the last 72 hours. Urine analysis:    Component Value Date/Time   COLORURINE STRAW (A) 03/31/2023 1332   APPEARANCEUR CLEAR 03/31/2023 1332   LABSPEC 1.032 (H)  03/31/2023 1332   PHURINE 7.0 03/31/2023 1332   GLUCOSEU >=500 (A) 03/31/2023 1332   GLUCOSEU NEGATIVE 01/11/2017 1146   HGBUR  NEGATIVE 03/31/2023 1332   HGBUR negative 11/06/2009 0854   BILIRUBINUR NEGATIVE 03/31/2023 1332   BILIRUBINUR negative 12/27/2022 0814   KETONESUR 20 (A) 03/31/2023 1332   PROTEINUR NEGATIVE 03/31/2023 1332   UROBILINOGEN 0.2 12/27/2022 0814   UROBILINOGEN 0.2 01/11/2017 1146   NITRITE NEGATIVE 03/31/2023 1332   LEUKOCYTESUR NEGATIVE 03/31/2023 1332   Sepsis Labs: Invalid input(s): "PROCALCITONIN", "LACTICIDVEN"  Microbiology: Recent Results (from the past 240 hour(s))  Blood Culture (routine x 2)     Status: None (Preliminary result)   Collection Time: 03/31/23 11:50 AM   Specimen: BLOOD  Result Value Ref Range Status   Specimen Description BLOOD RIGHT ANTECUBITAL  Final   Special Requests   Final    BOTTLES DRAWN AEROBIC AND ANAEROBIC Blood Culture adequate volume   Culture   Final    NO GROWTH < 24 HOURS Performed at [facility], [address] [place], [place] [address]    Report Status PENDING  Incomplete  Resp panel by RT-PCR (RSV, Flu A&B, Covid) Anterior Nasal Swab     Status: None   Collection Time: 03/31/23 12:04 PM   Specimen: Anterior Nasal Swab  Result Value Ref Range Status   SARS Coronavirus 2 by RT PCR NEGATIVE NEGATIVE Final   Influenza A by PCR NEGATIVE NEGATIVE Final   Influenza B by PCR NEGATIVE NEGATIVE Final    Comment: (NOTE) The Xpert Xpress SARS-CoV-2/FLU/RSV plus assay is intended as an aid in the diagnosis of influenza from Nasopharyngeal swab specimens and should not be used as a sole basis for treatment. Nasal washings and aspirates are unacceptable for Xpert Xpress SARS-CoV-2/FLU/RSV testing.  Fact Sheet for Patients: BloggerCourse.com  Fact Sheet for Healthcare Providers: SeriousBroker.it  This test is not yet approved or cleared by the [place]  FDA and has been authorized for detection and/or diagnosis of SARS-CoV-2 by FDA under an Emergency Use Authorization (EUA). This EUA will remain in effect (meaning this test can be used) for the duration of the COVID-19 declaration under Section 564(b)(1) of the Act, 21 U.S.C. section 360bbb-3(b)(1), unless the authorization is terminated or revoked.     Resp Syncytial Virus by PCR NEGATIVE NEGATIVE Final    Comment: (NOTE) Fact Sheet for Patients: BloggerCourse.com  Fact Sheet for Healthcare Providers: SeriousBroker.it  This test is not yet approved or cleared by the [place] FDA and has been authorized for detection and/or diagnosis of SARS-CoV-2 by FDA under an Emergency Use Authorization (EUA). This EUA will remain in effect (meaning this test can be used) for the duration of the COVID-19 declaration under Section 564(b)(1) of the Act, 21 U.S.C. section 360bbb-3(b)(1), unless the authorization is terminated or revoked.  Performed at [facility], [address] [place], [place] [address]   Blood Culture (routine x 2)     Status: None (Preliminary result)   Collection Time: 03/31/23 12:40 PM   Specimen: BLOOD  Result Value Ref Range Status   Specimen Description BLOOD BLOOD RIGHT HAND  Final   Special Requests   Final    AEROBIC BOTTLE ONLY Blood Culture results may not be optimal due to an inadequate volume of blood received in culture bottles   Culture   Final    NO GROWTH < 24 HOURS Performed at [facility], [address], [place], [place] [address]    Report Status PENDING  Incomplete  Culture, blood (Routine X 2)     Status: None (Preliminary result)   Collection  Time: 03/31/23  7:02 PM   Specimen: BLOOD  Result Value Ref Range Status   Specimen Description BLOOD BLOOD RIGHT ARM  Final   Special Requests   Final    BOTTLES DRAWN AEROBIC AND ANAEROBIC Blood Culture adequate volume   Culture    Final    NO GROWTH < 12 HOURS Performed at [facility], [address] [place], [place] [address]    Report Status PENDING  Incomplete  Culture, blood (Routine X 2)     Status: None (Preliminary result)   Collection Time: 03/31/23  7:02 PM   Specimen: BLOOD  Result Value Ref Range Status   Specimen Description BLOOD BLOOD LEFT ARM  Final   Special Requests   Final    BOTTLES DRAWN AEROBIC AND ANAEROBIC Blood Culture adequate volume   Culture   Final    NO GROWTH < 12 HOURS Performed at [facility], [address] [place], [place] [address]    Report Status PENDING  Incomplete    Radiology Studies: CT HEAD WO CONTRAST ( )  Result Date: 04/01/2023 CLINICAL DATA:  Mental status change with unknown cause. Fall in hospital. EXAM: CT HEAD WITHOUT CONTRAST TECHNIQUE: Contiguous axial images were obtained from the base of the skull through the vertex without intravenous contrast. RADIATION DOSE REDUCTION: This exam was performed according to the departmental dose-optimization program which includes automated exposure control, adjustment of the mA and/or kV according to patient size and/or use of iterative reconstruction technique. COMPARISON:  Head CTA from yesterday. FINDINGS: Brain: No evidence of acute infarction, hemorrhage, hydrocephalus, extra-axial collection or mass lesion/mass effect. Generalized brain atrophy Vascular: No hyperdense vessel or unexpected calcification. Skull: Normal. Negative for fracture or focal lesion. Sinuses/Orbits: No acute finding. IMPRESSION: Atrophic brain without new or acute finding. Electronically Signed   By: Tiburcio Pea M.D.   On: 04/01/2023 11:00   CT Chest Wo Contrast  Result Date: 03/31/2023 CLINICAL DATA:  Respiratory illness.  Previous lung nodule. EXAM: CT CHEST WITHOUT CONTRAST TECHNIQUE: Multidetector CT imaging of the chest was performed following the standard protocol without IV contrast. RADIATION DOSE REDUCTION: This exam  was performed according to the departmental dose-optimization program which includes automated exposure control, adjustment of the mA and/or kV according to patient size and/or use of iterative reconstruction technique. COMPARISON:  PET-CT scan June 2024.  X-ray earlier 03/31/2023. FINDINGS: Cardiovascular: Heart is nonenlarged. Trace pericardial fluid. Coronary artery calcifications are seen. The thoracic aorta overall has a normal course caliber with scattered vascular calcifications on this noncontrast examination. Ascending aorta however has a diameter of 4.1 cm. There is some prominence of the pulmonary arterial tree as well Mediastinum/Nodes: Right-sided thyroid nodule identified. Slightly patulous thoracic esophagus. No discrete abnormal lymph node enlargement identified in the axillary regions, hilum or mediastinum on this noncontrast examination. Lungs/Pleura: Extensive breathing motion throughout the examination. No pneumothorax or effusion. There is some mild patchy opacities along the lung bases. Atelectasis is favored over infiltrate but recommend follow-up. Previous nodule in the medial left lung base is again seen but poorly defined with motion. Today diameter approaches 12 mm and is unchanged from the previous exam recommend continued follow up in 1 year as previously described. Upper Abdomen: Adrenal glands are preserved in the upper abdomen. There is contrast seen in the renal collecting systems consistent with previous CT angiogram. There is also a cystic lesion along the head of the pancreas again measuring 4.1 x 2.4 cm. Again recommend follow up in  2 years. Musculoskeletal: Stable areas sclerosis along the left humeral head. Mild degenerative changes along the spine. IMPRESSION: Mild parenchymal opacities along bases. Atelectasis is favored over infiltrate recommend follow-up. 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 Recommend annual imaging followup by CTA or MRA. This  recommendation follows 2010 ACCF/AHA/AATS/ACR/ASA/SCA/SCAI/SIR/STS/SVM Guidelines for the Diagnosis and Management of Patients with Thoracic Aortic Disease. Circulation. 2010; 121: W098-J191. Aortic aneurysm NOS (ICD10-I71.9) 12 mm stable left lower lobe lung nodule. Based on prior workup would recommend continued follow up surveillance in 1 year. Cystic area along the pancreas once again seen. As per prior recommend follow up imaging in 1-2 years. Small cystic nodule along the right thyroid lobe as seen on the CTA. Recommend thyroid ultrasound when clinically appropriate Aortic Atherosclerosis (ICD10-I70.0). Electronically Signed   By: Karen Kays M.D.   On: 03/31/2023 14:27      Tynasia Mccaul T. Lakitha Gordy [facility]  If 7PM-7AM, please contact night-coverage [email or web address] 04/01/2023, 12:31 PM

## 2023-04-02 DIAGNOSIS — E041 Nontoxic single thyroid nodule: Secondary | ICD-10-CM | POA: Diagnosis not present

## 2023-04-02 DIAGNOSIS — I10 Essential (primary) hypertension: Secondary | ICD-10-CM | POA: Diagnosis not present

## 2023-04-02 DIAGNOSIS — G9341 Metabolic encephalopathy: Secondary | ICD-10-CM | POA: Diagnosis not present

## 2023-04-02 DIAGNOSIS — J690 Pneumonitis due to inhalation of food and vomit: Secondary | ICD-10-CM | POA: Diagnosis not present

## 2023-04-02 DIAGNOSIS — F0670 Mild neurocognitive disorder due to known physiological condition without behavioral disturbance: Secondary | ICD-10-CM

## 2023-04-02 LAB — RENAL FUNCTION PANEL
Albumin: 3.2 g/dL — ABNORMAL LOW (ref 3.5–5.0)
Anion gap: 6 (ref 5–15)
BUN: 9 mg/dL (ref 8–23)
CO2: 26 mmol/L (ref 22–32)
Calcium: 8.7 mg/dL — ABNORMAL LOW (ref 8.9–10.3)
Chloride: 106 mmol/L (ref 98–111)
Creatinine, Ser: 0.77 mg/dL (ref 0.61–1.24)
GFR, Estimated: >60 mL/min (ref >60)
Glucose, Bld: 127 mg/dL — ABNORMAL HIGH (ref 70–99)
Phosphorus: 3.1 mg/dL (ref 2.5–4.6)
Potassium: 3.1 mmol/L — ABNORMAL LOW (ref 3.5–5.1)
Sodium: 138 mmol/L (ref 135–145)

## 2023-04-02 LAB — GLUCOSE, CAPILLARY
Glucose-Capillary: 127 mg/dL — ABNORMAL HIGH (ref 70–99)
Glucose-Capillary: 133 mg/dL — ABNORMAL HIGH (ref 70–99)
Glucose-Capillary: 261 mg/dL — ABNORMAL HIGH (ref 70–99)

## 2023-04-02 LAB — CBC
HCT: 36.3 % — ABNORMAL LOW (ref 39.0–52.0)
Hemoglobin: 12.7 g/dL — ABNORMAL LOW (ref 13.0–17.0)
MCH: 31.1 pg (ref 26.0–34.0)
MCHC: 35 g/dL (ref 30.0–36.0)
MCV: 88.8 fL (ref 80.0–100.0)
Platelets: 168 10*3/uL (ref 150–400)
RBC: 4.09 MIL/uL — ABNORMAL LOW (ref 4.22–5.81)
RDW: 12.6 % (ref 11.5–15.5)
WBC: 10 10*3/uL (ref 4.0–10.5)
nRBC: 0 % (ref 0.0–0.2)

## 2023-04-02 LAB — MAGNESIUM: Magnesium: 1.7 mg/dL (ref 1.7–2.4)

## 2023-04-02 MED ORDER — AMOXICILLIN-POT CLAVULANATE 875-125 MG PO TABS: 1.0000 | ORAL_TABLET | Freq: Two times a day (BID) | ORAL | 0 refills | Status: AC

## 2023-04-02 MED ORDER — QUETIAPINE FUMARATE 25 MG PO TABS: 25.0000 mg | ORAL_TABLET | Freq: Every day | ORAL | 2 refills | Status: DC

## 2023-04-02 MED ORDER — QUETIAPINE FUMARATE 25 MG PO TABS: 25.0000 mg | ORAL_TABLET | Freq: Every day | ORAL | Status: DC

## 2023-04-02 MED ORDER — POTASSIUM CHLORIDE CRYS ER 20 MEQ PO TBCR
40.0000 meq | EXTENDED_RELEASE_TABLET | ORAL | Status: AC
  Administered 2023-04-02 (×2): 40 meq via ORAL
  Filled 2023-04-02 (×2): qty 2

## 2023-04-02 NOTE — Evaluation (Signed)
Physical Therapy Evaluation Patient Details Name: Walter [name] MRN: [id number] DOB: July 10, 1940 Today's Date: 04/02/2023  History of Present Illness  Walter [name] is a 82 y.o. male who presents with altered mental status; with medical history significant of mild cognitive impairment, hyperlipidemia, diabetes type 2, hypertension, R toe amputations  Clinical Impression   Pt admitted with above diagnosis. Lives at home with wife, in a single-level home with a few steps to enter; Prior to admission, pt was able to walk; Reports he typically walks without an assistive device; Will need to conform home setup and PLOF with pt's wife/family; Presents to PT with unsteadiness with walking, incr fall risk;  Today, Millen participated well, got up to EOB with min assist, stood in room and walked to sink with min steadying assist, and stood at sink with contact guard; he was agreeable to walk in the hallway (we opted to walk with handheld assist), and he had a few losses of balance to the right, requiring light mod assist to correct; In considering options for discharge, I value going back to familiar environment, caregivers, and routines for patients with dementia -- porvided pt's wife feels she is able and has enough support to provide care for her husband;  Rec HHPT follow up for more discernment of optimal assistive device with amb; Pt currently with functional limitations due to the deficits listed below (see PT Problem List). Pt will benefit from skilled PT to increase their independence and safety with mobility to allow discharge to the venue listed below.           If plan is discharge home, recommend the following: A little help with walking and/or transfers;A little help with bathing/dressing/bathroom;Assistance with cooking/housework;Help with stairs or ramp for entrance;Supervision due to cognitive status   Can travel by private vehicle        Equipment Recommendations Rolling walker (2  wheels);BSC/3in1;Other (comment) (May already have a RW; will need to confirm with wife)  Recommendations for Other Services       Functional Status Assessment Patient has had a recent decline in their functional status and demonstrates the ability to make significant improvements in function in a reasonable and predictable amount of time.     Precautions / Restrictions Precautions Precautions: Fall Precaution Comments: R toe amputations effecting balance; consider using his shoes Restrictions Weight Bearing Restrictions: No      Mobility  Bed Mobility Overal bed mobility: Needs Assistance Bed Mobility: Supine to Sit     Supine to sit: HOB elevated, Min assist     General bed mobility comments: Multiple cues to initiate; uncoordinated movement, with tendency to lean backward; min assist to shift weight forward for upright sitting    Transfers Overall transfer level: Needs assistance Equipment used: 1 person hand held assist Transfers: Sit to/from Stand Sit to Stand: Min assist           General transfer comment: Min handheld assist to stedy    Ambulation/Gait Ambulation/Gait assistance: Min assist, Mod assist Gait Distance (Feet): 180 Feet Assistive device: 1 person hand held assist Gait Pattern/deviations: Step-through pattern Gait velocity: slowed     General Gait Details: Min  handheld assist to stedy; 3 losses of balance while walk9ing hallway, tending to lose balance to the R; Pt was able to verbalize that he had teh great toe and second toe amputations on the right, and that effects his balance; we talked about likelihood that he would walk a bit better with his shoes on (wearing  hospital socks)  Stairs            Wheelchair Mobility     Tilt Bed    Modified Rankin (Stroke Patients Only)       Balance Overall balance assessment: Needs assistance   Sitting balance-Leahy Scale: Fair Sitting balance - Comments: Able to sit EOB without  assistance once he gained his balance with feet on the floor; Difficulty with posterior lean can be attributed to the bunch of blankets inder pt     Standing balance-Leahy Scale: Fair Standing balance comment: Stood at sink for teeth brushing; Tending to lose balance to teh R with amb                             Pertinent Vitals/Pain Pain Assessment Pain Assessment: Faces Faces Pain Scale: Hurts a little bit Breathing: normal Negative Vocalization: occasional moan/groan, low speech, negative/disapproving quality (Reports general body aches/soreness with movement) Facial Expression: smiling or inexpressive Body Language: relaxed Consolability: no need to console PAINAD Score: 1 Pain Location: general body aches Pain Descriptors / Indicators: Aching Pain Intervention(s): Monitored during session, Repositioned    Home Living Family/patient expects to be discharged to:: Private residence Living Arrangements: Spouse/significant other;Children (daughters comes over PRN) Available Help at Discharge: Family;Available PRN/intermittently Type of Home: House Home Access: Stairs to enter   Entrance Stairs-Number of Steps: 2 Alternate Level Stairs-Number of Steps: chairlift Home Layout: Two level   Additional Comments: pt questionable historian    Prior Function Prior Level of Function : Independent/Modified Independent             Mobility Comments: ind ADLs Comments: reports ind     Extremity/Trunk Assessment   Upper Extremity Assessment Upper Extremity Assessment: Defer to OT evaluation    Lower Extremity Assessment Lower Extremity Assessment: Generalized weakness (and R foot first and second ray toe amputations)    Cervical / Trunk Assessment Cervical / Trunk Assessment: Normal  Communication   Communication Communication: No apparent difficulties Cueing Techniques: Verbal cues;Gestural cues;Tactile cues  Cognition Arousal: Alert Behavior During  Therapy: WFL for tasks assessed/performed Overall Cognitive Status: No family/caregiver present to determine baseline cognitive functioning                                 General Comments: Likely at or close to baseline        General Comments General comments (skin integrity, edema, etc.): Pt calm and participating well today; will need ot confirm home situation with pt's wife/family    Exercises     Assessment/Plan    PT Assessment Patient needs continued PT services  PT Problem List Decreased strength;Decreased activity tolerance;Decreased balance;Decreased mobility;Decreased coordination;Decreased cognition;Decreased knowledge of use of DME;Decreased safety awareness;Decreased knowledge of precautions       PT Treatment Interventions DME instruction;Gait training;Stair training;Functional mobility training;Therapeutic activities;Therapeutic exercise;Balance training;Neuromuscular re-education;Cognitive remediation;Patient/family education    PT Goals (Current goals can be found in the Care Plan section)  Acute Rehab PT Goals Patient Stated Goal: Did not specifically state; but agrees to walking in the hallway PT Goal Formulation: Patient unable to participate in goal setting Time For Goal Achievement: 04/16/23 Potential to Achieve Goals: Good    Frequency Min 1X/week     Co-evaluation               AM-PAC PT "6 Clicks" Mobility  Outcome Measure Help needed  turning from your back to your side while in a flat bed without using bedrails?: None Help needed moving from lying on your back to sitting on the side of a flat bed without using bedrails?: A Little Help needed moving to and from a bed to a chair (including a wheelchair)?: A Little Help needed standing up from a chair using your arms (e.g., wheelchair or bedside chair)?: A Little Help needed to walk in hospital room?: A Little Help needed climbing 3-5 steps with a railing? : A Little 6 Click  Score: 19    End of Session Equipment Utilized During Treatment: Gait belt Activity Tolerance: Patient tolerated treatment well Patient left: in chair;with call bell/phone within reach;with bed alarm set Nurse Communication: Mobility status PT Visit Diagnosis: Unsteadiness on feet (R26.81);Other abnormalities of gait and mobility (R26.89)    Time: 2841-3244 PT Time Calculation (min) (ACUTE ONLY): 22 min   Charges:   PT Evaluation $PT Eval Moderate Complexity: 1 Mod   PT General Charges $$ ACUTE PT VISIT: 1 Visit         Van Clines, PT  Acute Rehabilitation Services Office [phone] Secure Chat welcomed   Levi Aland 04/02/2023, 9:46 AM

## 2023-04-02 NOTE — TOC Transition Note (Signed)
Transition of Care [facility]) - CM/SW Discharge Note   Patient Details  Name: Walter [name] MRN: [id number] Date of Birth: [birth year]  [facility] CM/SW Contact:  Lawerance Sabal, RN Phone Number: 04/02/2023, 1:34 PM   Clinical Narrative:     [name] w patient's wife. She is agreeable to [facility] services, would like to [facility], and they have accepted the case.  Wife states that they have RW, shower chair at home. Would like 3/1. This will be delivered to the room through [facility]   Final next level of care: Home w Home Health Services Barriers to Discharge: No Barriers Identified   Patient Goals and CMS Choice CMS Medicare.gov Compare Post Acute Care list provided to:: Other (Comment Required) Choice offered to / list presented to : Spouse  Discharge Placement                         Discharge Plan and Services Additional resources added to the After Visit Summary for                  DME Arranged: Bedside commode DME Agency: [employer] Date DME Agency Contacted: 04/02/23 Time DME Agency Contacted: 1334 Representative spoke with at DME Agency: Vaughan Basta HH Arranged: PT, OT, Speech Therapy HH Agency: [facility] Date [facility] Agency Contacted: 04/02/23 Time HH Agency Contacted: 1334 Representative spoke with at [facility] Agency: Kandee Keen  Social Determinants of Health (SDOH) Interventions SDOH Screenings   Food Insecurity: No Food Insecurity (03/31/2023)  Housing: Low Risk  (03/31/2023)  Recent Concern: Housing - High Risk (03/31/2023)  Transportation Needs: No Transportation Needs (03/31/2023)  Utilities: Not At Risk (03/31/2023)  Depression (PHQ2-9): Low Risk  (08/22/2022)  Financial Resource Strain: Low Risk  (05/12/2022)  Physical Activity: Sufficiently Active (05/12/2022)  Social Connections: Moderately Integrated (05/12/2022)  Stress: No Stress Concern Present (05/12/2022)  Tobacco Use: Medium Risk (03/31/2023)     Readmission Risk Interventions      No data to display

## 2023-04-02 NOTE — Evaluation (Signed)
Occupational Therapy Evaluation Patient Details Name: Walter [name] MRN: [id number] DOB: [birth year] Today's Date: 04/02/2023   History of Present Illness Lavaris Melly is a 82 y.o. male who presents with altered mental status; with medical history significant of mild cognitive impairment, hyperlipidemia, diabetes type 2, hypertension, R toe amputations   Clinical Impression   Pt questionable historian, reports ind at baseline with ADL/functional mobility, lives with spouse and has family to assist at d/c. Pt currently needing set up - min A for ADLs, min A for bed mobility and min A for transfers with 1 person HHA. Pt needs incr cues to follow commands and for safety. Pt presenting with impairments listed below, will follow acutely. Recommend HHOT at d/c.        If plan is discharge home, recommend the following: A little help with walking and/or transfers;A little help with bathing/dressing/bathroom;Assistance with cooking/housework;Assist for transportation;Help with stairs or ramp for entrance    Functional Status Assessment  Patient has had a recent decline in their functional status and demonstrates the ability to make significant improvements in function in a reasonable and predictable amount of time.  Equipment Recommendations  Tub/shower seat    Recommendations for Other Services PT consult     Precautions / Restrictions Precautions Precautions: Fall Precaution Comments: R toe amputations effecting balance; consider using his shoes Restrictions Weight Bearing Restrictions: No      Mobility Bed Mobility Overal bed mobility: Needs Assistance Bed Mobility: Supine to Sit     Supine to sit: HOB elevated, Min assist          Transfers Overall transfer level: Needs assistance Equipment used: 1 person hand held assist Transfers: Sit to/from Stand Sit to Stand: Min assist                  Balance Overall balance assessment: Needs assistance Sitting-balance support:  Feet supported Sitting balance-Leahy Scale: Fair Sitting balance - Comments: Able to sit EOB without assistance once he gained his balance with feet on the floor; Difficulty with posterior lean can be attributed to the bunch of blankets inder pt     Standing balance-Leahy Scale: Fair Standing balance comment: Stood at sink for teeth brushing; Tending to lose balance to teh R with amb                           ADL either performed or assessed with clinical judgement   ADL Overall ADL's : Needs assistance/impaired Eating/Feeding: Set up   Grooming: Set up   Upper Body Bathing: Contact guard assist   Lower Body Bathing: Minimal assistance   Upper Body Dressing : Contact guard assist   Lower Body Dressing: Minimal assistance   Toilet Transfer: Contact guard assist   Toileting- Clothing Manipulation and Hygiene: Contact guard assist       Functional mobility during ADLs: Contact guard assist       Vision   Vision Assessment?: No apparent visual deficits     Perception Perception: Not tested       Praxis Praxis: Not tested       Pertinent Vitals/Pain Pain Assessment Pain Assessment: Faces Pain Score: 2  Faces Pain Scale: Hurts a little bit Pain Location: general body aches Pain Descriptors / Indicators: Aching Pain Intervention(s): Limited activity within patient's tolerance, Monitored during session, Repositioned     Extremity/Trunk Assessment Upper Extremity Assessment Upper Extremity Assessment: Generalized weakness   Lower Extremity Assessment Lower Extremity Assessment: Defer to  PT evaluation   Cervical / Trunk Assessment Cervical / Trunk Assessment: Normal   Communication Communication Communication: No apparent difficulties Cueing Techniques: Verbal cues;Gestural cues;Tactile cues   Cognition Arousal: Alert Behavior During Therapy: WFL for tasks assessed/performed Overall Cognitive Status: No family/caregiver present to determine  baseline cognitive functioning                                 General Comments: Likely at or close to baseline     General Comments  VSS    Exercises     Shoulder Instructions      Home Living Family/patient expects to be discharged to:: Private residence Living Arrangements: Spouse/significant other;Children (daughters comes over PRN) Available Help at Discharge: Family;Available PRN/intermittently Type of Home: House Home Access: Stairs to enter [employer] of Steps: 2   Home Layout: Two level Alternate Level Stairs-Number of Steps: chairlift   Bathroom Shower/Tub: Tub/shower unit             Additional Comments: pt questionable historian      Prior Functioning/Environment Prior Level of Function : Independent/Modified Independent             Mobility Comments: ind ADLs Comments: reports ind        OT Problem List: Decreased strength;Decreased range of motion;Decreased activity tolerance;Impaired balance (sitting and/or standing);Decreased cognition;Decreased coordination;Decreased safety awareness      OT Treatment/Interventions: Self-care/ADL training;Therapeutic exercise;DME and/or AE instruction;Energy conservation;Therapeutic activities;Patient/family education;Balance training;Cognitive remediation/compensation    OT Goals(Current goals can be found in the care plan section) Acute Rehab OT Goals Patient Stated Goal: none stated OT Goal Formulation: With patient Time For Goal Achievement: 04/16/23 Potential to Achieve Goals: Good ADL Goals Pt Will Perform Upper Body Dressing: with supervision Pt Will Perform Lower Body Dressing: with supervision Pt Will Transfer to Toilet: with supervision Pt Will Perform Tub/Shower Transfer: with supervision  OT Frequency: Min 1X/week    Co-evaluation              AM-PAC OT "6 Clicks" Daily Activity     Outcome Measure Help from another person eating meals?: A Little Help  from another person taking care of personal grooming?: A Little Help from another person toileting, which includes using toliet, bedpan, or urinal?: A Little Help from another person bathing (including washing, rinsing, drying)?: A Little Help from another person to put on and taking off regular upper body clothing?: A Little Help from another person to put on and taking off regular lower body clothing?: A Little 6 Click Score: 18   End of Session Equipment Utilized During Treatment: Gait belt Nurse Communication: Mobility status  Activity Tolerance: Patient tolerated treatment well Patient left: in chair;with call bell/phone within reach;with chair alarm set  OT Visit Diagnosis: Unsteadiness on feet (R26.81);Other abnormalities of gait and mobility (R26.89);Muscle weakness (generalized) (M62.81)                Time: 5732-2025 OT Time Calculation (min): 22 min Charges:  OT General Charges $OT Visit: 1 Visit OT Evaluation $OT Eval Moderate Complexity: 1 Mod  Aysha Livecchi K, OTD, OTR/L SecureChat Preferred Acute Rehab [phone]   Gurfateh Mcclain K Koonce 04/02/2023, 12:30 PM

## 2023-04-02 NOTE — Evaluation (Signed)
Speech Language Pathology Evaluation Patient Details Name: Walter [name] MRN: [id number] DOB: 01-Oct-1940 Today's Date: 04/02/2023 Time: 4098-1191 SLP Time Calculation (min) (ACUTE ONLY): 10 min  Problem List:  Patient Active Problem List   Diagnosis Date Noted   Dementia with behavioral disturbance (HCC) 04/01/2023   Severe sepsis (HCC) 03/31/2023   Acute metabolic encephalopathy 03/31/2023   Thyroid nodule 03/31/2023   Lactic acidosis 03/31/2023   History of dysplastic nevus    History of skin cancer    Pulmonary nodule 02/04/2022   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   Duodenal ulcer hemorrhage    Allergic rhinitis 08/19/2019   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   BPH (benign prostatic hyperplasia) 07/02/2015   Aspiration pneumonia (HCC) 05/19/2015   Hallux valgus of right foot 03/26/2014   History of colon cancer 01/27/2014   Iritis 12/17/2008   Postherpetic neuralgia 05/22/2008   Bigeminy 04/21/2008   Diabetes mellitus type II, controlled 04/23/2007   Hyperlipidemia associated with type 2 diabetes mellitus 04/23/2007   Obstructive sleep apnea 04/23/2007   Essential hypertension 04/23/2007   Past Medical History:  Past Medical History:  Diagnosis Date   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   S/p 2nd ray amputation 2018- started after prolonged use of corn pad   Acute upper GI bleeding 01/26/2022   Allergic rhinitis 08/19/2019   flonase   Anemia    BPH (benign prostatic hyperplasia) 07/02/2015   S/p TURP with multiple complications afterwards including recurrent hospitalizations. All started after a hemorrhoid surgery and later with urinary retention. Patient at one point was septic from urinary issues.     Diabetes mellitus type II, controlled 04/23/2007    Metformin 1g BID, amaryl 4mg --> 8mg , had to add [place] back  Never took victoza.   Januvia-lethargic and dizzy  in past. 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 Follows with Dr. 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   Pulmonary nodule 02/04/2022   Sepsis secondary to UTI 03/29/2015   Past Surgical History:  Past Surgical History:  Procedure Laterality Date   AMPUTATION TOE Right 09/14/2016   Procedure: RIGHT 2ND TOE/RAY AMPUTATION;  Surgeon: Tarry Kos, MD;  Location: [facility];  Service: Orthopedics;  Laterality: Right;   APPENDECTOMY  05/03/1947   BIOPSY  01/28/2022   Procedure: BIOPSY;  Surgeon: Sherrilyn Rist, MD;  Location[facility] END[facility];  Service: Gastroenterology;;   COLECTOMY  05/02/1990   CYSTOSCOPY N/A 06/21/2015   Procedure: CYSTOSCOPY,  CLOT EVACUATION, AND CAUTERIZATION OF PROSTATE FOSSA;  Surgeon: Jethro Bolus, MD;  Location: [facility];  Service: Urology;  Laterality: N/A;   ESOPHAGOGASTRODUODENOSCOPY (EGD) WITH PROPOFOL N/A 01/26/2022   Procedure: ESOPHAGOGASTRODUODENOSCOPY (EGD) WITH PROPOFOL;  Surgeon: Sherrilyn Rist, MD;  Location: [facility] ENDOSCOPY;  Service: Gastroenterology;  Laterality: N/A;    ESOPHAGOGASTRODUODENOSCOPY (EGD) WITH PROPOFOL N/A 01/28/2022   Procedure: ESOPHAGOGASTRODUODENOSCOPY (EGD) WITH PROPOFOL;  Surgeon: Sherrilyn Rist, MD;  [facility] END[facility]COPY;  Service: Gastroenterology;  Laterality: N/A;   HEMORRHOID SURGERY     HEMOSTASIS CLIP PLACEMENT  01/26/2022   Procedure: HEMOSTASIS CLIP PLACEMENT;  Surgeon: Sherrilyn Rist, MD;  Location: [facility] ENDOSCOPY;  Service: Gastroenterology;;   HEMOSTASIS CONTROL  01/26/2022   Procedure: HEMOSTASIS CONTROL;  Surgeon: Sherrilyn Rist, MD;  Location: [facility] ENDOSCOPY;  Service: Gastroenterology;;   HOT HEMOSTASIS N/A 01/26/2022   Procedure: HOT HEMOSTASIS (ARGON PLASMA COAGULATION/BICAP);  Surgeon: Sherrilyn Rist, MD;  Location: [facility] ENDOSCOPY;  Service: Gastroenterology;  Laterality: N/A;   MOLE REMOVAL  2022   hand   SCLEROTHERAPY  01/26/2022   Procedure: SCLEROTHERAPY;  Surgeon: Sherrilyn Rist, MD;  Location: [facility] ENDOSCOPY;  Service: Gastroenterology;;   TOE SURGERY  04/2020   TRANSURETHRAL RESECTION OF PROSTATE N/A 05/23/2015   Procedure: TRANSURETHRAL RESECTION OF THE PROSTATE (TURP);  Surgeon: Bjorn Pippin, MD;  Location: [facility];  Service: Urology;  Laterality: N/A;   HPI:  Pt is an 82 yo male presenting 11/29 with AMS, found gurgling, coughing, and difficult to arouse by wife. Admitted with sepsis due to PNA with concern for aspiration. CT Head showed atrophic brain without acute findings. PMH includes: dementia, DMII, HTN, HLD   Assessment / Plan / Recommendation Clinical Impression  Pt presents with cognitive-linguistic impairments with no family present at time of eval to confirm PLOF. CT Head suggestive of more chronic findings; no MRI ordered. He has reduced content in verbal expression, with noteable semantic paraphasias and circumlocution. He responds well to sentence completion and intermittent phonemic cues during confrontational naming. Min cues were provided for completion of one-step commands. He is oriented  to person, although question impact of language abilities on assessment of cognitive status at the moment. He would benefit from ongoing SLP f/u for ongoing differential diagnosis of abilities, hopefully able to confirm PLOF with family as well.    SLP Assessment  SLP Recommendation/Assessment: Patient needs continued Speech Lanaguage Pathology Services SLP Visit Diagnosis: Cognitive communication deficit (R41.841)    Recommendations for follow up therapy are one component of a multi-disciplinary discharge planning process, led by the attending physician.  Recommendations may be updated based on patient status, additional functional criteria and insurance authorization.    Follow Up Recommendations  Home health SLP    Assistance Recommended at Discharge  Frequent or constant Supervision/Assistance  Functional Status Assessment Patient has had a recent decline in their functional status and demonstrates the ability to make significant improvements in function in a reasonable and predictable amount of time.  Frequency and Duration min 2x/week  2 weeks      SLP Evaluation Cognition  Overall Cognitive Status: No family/caregiver present to determine baseline cognitive functioning Arousal/Alertness: Awake/alert Orientation Level: Oriented to person;Disoriented to place;Disoriented to time;Disoriented to situation       Comprehension  Auditory Comprehension Overall Auditory Comprehension: Impaired Commands: Impaired One Step Basic Commands: 75-100% accurate Conversation: Simple    Expression Expression Primary Mode of Expression: Verbal Verbal Expression Overall Verbal Expression:  Impaired Initiation: No impairment Automatic Speech: Name Level of Generative/Spontaneous Verbalization: Phrase Naming: Impairment Confrontation: Impaired Verbal Errors: Semantic paraphasias;Other (comment) (circumlocution) Pragmatics: No impairment Effective Techniques: Sentence completion;Phonemic  cues Non-Verbal Means of Communication: Not applicable   Oral / Motor  Oral Motor/Sensory Function Overall Oral Motor/Sensory Function: Within functional limits Motor Speech Overall Motor Speech: Appears within functional limits for tasks assessed            Mahala Menghini., M.A. CCC-SLP Acute Rehabilitation Services Office [phone]  Secure chat preferred  04/02/2023, 11:37 AM

## 2023-04-02 NOTE — Plan of Care (Signed)
  Problem: Education: Goal: Ability to describe self-care measures that may prevent or decrease complications (Diabetes Survival Skills Education) will improve Outcome: Adequate for Discharge Goal: Individualized Educational Video(s) Outcome: Adequate for Discharge   Problem: Coping: Goal: Ability to adjust to condition or change in health will improve Outcome: Adequate for Discharge   Problem: Health Behavior/Discharge Planning: Goal: Ability to identify and utilize available resources and services will improve Outcome: Adequate for Discharge Goal: Ability to manage health-related needs will improve Outcome: Adequate for Discharge   Problem: Metabolic: Goal: Ability to maintain appropriate glucose levels will improve Outcome: Adequate for Discharge   Problem: Nutritional: Goal: Maintenance of adequate nutrition will improve Outcome: Adequate for Discharge   Problem: Education: Goal: Knowledge of General Education information will improve Description: Including pain rating scale, medication(s)/side effects and non-pharmacologic comfort measures Outcome: Adequate for Discharge   Problem: Health Behavior/Discharge Planning: Goal: Ability to manage health-related needs will improve Outcome: Adequate for Discharge   Problem: Clinical Measurements: Goal: Ability to maintain clinical measurements within normal limits will improve Outcome: Adequate for Discharge Goal: Will remain free from infection Outcome: Adequate for Discharge Goal: Diagnostic test results will improve Outcome: Adequate for Discharge   Problem: Activity: Goal: Risk for activity intolerance will decrease Outcome: Adequate for Discharge   Problem: Nutrition: Goal: Adequate nutrition will be maintained Outcome: Adequate for Discharge   Problem: Coping: Goal: Level of anxiety will decrease Outcome: Adequate for Discharge   Problem: Elimination: Goal: Will not experience complications related to bowel  motility Outcome: Adequate for Discharge   Problem: Pain Management: Goal: General experience of comfort will improve Outcome: Adequate for Discharge   Problem: Safety: Goal: Ability to remain free from injury will improve Outcome: Adequate for Discharge

## 2023-04-02 NOTE — Evaluation (Signed)
Clinical/Bedside Swallow Evaluation Patient Details  Name: Walter [name] MRN: [id number] Date of Birth: June 22, 1940  Today's Date: 04/02/2023 Time: SLP Start Time (ACUTE ONLY): 1021 SLP Stop Time (ACUTE ONLY): 1031 SLP Time Calculation (min) (ACUTE ONLY): 10 min  Past Medical History:  Past Medical History:  Diagnosis Date   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   S/p 2nd ray amputation 2018- started after prolonged use of corn pad   Acute upper GI bleeding 01/26/2022   Allergic rhinitis 08/19/2019   flonase   Anemia    BPH (benign prostatic hyperplasia) 07/02/2015   S/p TURP with multiple complications afterwards including recurrent hospitalizations. All started after a hemorrhoid surgery and later with urinary retention. Patient at one point was septic from urinary issues.     Diabetes mellitus type II, controlled 04/23/2007    Metformin 1g BID, amaryl 4mg --> 8mg , had to add [place] back  Never took victoza.   Januvia-lethargic and dizzy in past. 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 Follows with Dr. 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   Pulmonary nodule 02/04/2022   Sepsis secondary to UTI 03/29/2015   Past Surgical History:  Past Surgical History:  Procedure Laterality Date   AMPUTATION TOE Right 09/14/2016   Procedure: RIGHT 2ND TOE/RAY AMPUTATION;  Surgeon: Tarry Kos, MD;  Location: [facility];  Service: Orthopedics;  Laterality: Right;   APPENDECTOMY  05/03/1947   BIOPSY  01/28/2022   Procedure: BIOPSY;  Surgeon: Sherrilyn Rist, MD;  Location: [facility] ENDOSCOPY;  Service: Gastroenterology;;   COLECTOMY  05/02/1990   CYSTOSCOPY N/A 06/21/2015   Procedure: CYSTOSCOPY, CLOT EVACUATION, AND CAUTERIZATION OF PROSTATE FOSSA;  Surgeon: Jethro Bolus, MD;  Location: [facility];  Service: Urology;  Laterality: N/A;   ESOPHAGOGASTRODUODENOSCOPY (EGD) WITH PROPOFOL N/A 01/26/2022   Procedure: ESOPHAGOGASTRODUODENOSCOPY (EGD) WITH PROPOFOL;  Surgeon: Sherrilyn Rist, MD;  Location: [facility] ENDOSCOPY;  Service: Gastroenterology;  Laterality: N/A;   ESOPHAGOGASTRODUODENOSCOPY (EGD) WITH PROPOFOL N/A 01/28/2022   Procedure: ESOPHAGOGASTRODUODENOSCOPY (EGD) WITH PROPOFOL;  Surgeon: Sherrilyn Rist, MD;  Location: [facility] ENDOSCOPY;  Service: Gastroenterology;  Laterality: N/A;   HEMORRHOID SURGERY     HEMOSTASIS CLIP PLACEMENT  01/26/2022   Procedure: HEMOSTASIS CLIP PLACEMENT;  Surgeon: Sherrilyn Rist, MD;  Location: [facility];  Service: Gastroenterology;;   HEMOSTASIS CONTROL  01/26/2022   Procedure: HEMOSTASIS CONTROL;  Surgeon: Sherrilyn Rist, MD;  Location: [facility] ENDOSCOPY;  Service: Gastroenterology;;   HOT HEMOSTASIS N/A 01/26/2022   Procedure: HOT HEMOSTASIS (ARGON PLASMA COAGULATION/BICAP);  Surgeon: Sherrilyn Rist, MD;  Location: [facility]OSCOPY;  Service: Gastroenterology;  Laterality: N/A;   MOLE REMOVAL  2022   hand   SCLEROTHERAPY  01/26/2022   Procedure: SCLEROTHERAPY;  Surgeon: Sherrilyn Rist, MD;  Location: [facility] ENDOSCOPY;  Service: Gastroenterology;;   TOE SURGERY   04/2020   TRANSURETHRAL RESECTION OF PROSTATE N/A 05/23/2015   Procedure: TRANSURETHRAL RESECTION OF THE PROSTATE (TURP);  Surgeon: Bjorn Pippin, MD;  Location: [facility];  Service: Urology;  Laterality: N/A;   HPI:  Pt is an 82 yo male presenting 11/29 with AMS, found gurgling, coughing, and difficult to arouse by wife. Admitted with sepsis due to PNA with concern for aspiration. CT Head showed atrophic brain without acute findings. PMH includes: dementia, DMII, HTN, HLD    Assessment / Plan / Recommendation  Clinical Impression  Pt's oropharyngeal swallow appears to be [facility]. Note that there was concern for aspiration and PNA upon arrival. If aspiration related, it is possible that this may have been an isolated episode. SLP will f/u briefly given concern for aspriation PNA but will leave on unrestricted solid textures and thin liquids for now.  SLP Visit Diagnosis: Dysphagia, unspecified (R13.10)    Aspiration Risk  Mild aspiration risk    Diet Recommendation Regular;Thin liquid    Liquid Administration via: Cup;Straw Medication Administration: Whole meds with liquid Supervision: Patient able to self feed;Intermittent supervision to cue for compensatory strategies Compensations: Slow rate;Small sips/bites;Minimize environmental distractions Postural Changes: Seated upright at 90 degrees    Other  Recommendations Oral Care Recommendations: Oral care BID    Recommendations for follow up therapy are one component of a multi-disciplinary discharge planning process, led by the attending physician.  Recommendations may be updated based on patient status, additional functional criteria and insurance authorization.  Follow up Recommendations No SLP follow up      Assistance Recommended at Discharge    Functional Status Assessment Patient has had a recent decline in their functional status and demonstrates the ability to make significant improvements in function in a reasonable and predictable  amount of time.  Frequency and Duration min 1 x/week  1 week       Prognosis Barriers to Reach Goals: Cognitive deficits      Swallow Study   General HPI: Pt is an 82 yo male presenting 11/29 with AMS, found gurgling, coughing, and difficult to arouse by wife. Admitted with sepsis due to PNA with concern for aspiration. CT Head showed atrophic brain without acute findings. PMH includes: dementia, DMII, HTN, HLD Type of Study: Bedside Swallow Evaluation Previous Swallow Assessment: none in chart Diet Prior to this Study: Regular;Thin liquids (Level 0) Temperature Spikes Noted: No Respiratory Status: Room air History of Recent Intubation: No Behavior/Cognition: Alert;Cooperative;Pleasant mood;Confused;Requires cueing Oral Cavity Assessment: Within Functional Limits Oral Care Completed by SLP: No Oral Cavity - Dentition: Adequate natural dentition Vision: Functional for self-feeding Self-Feeding Abilities: Able to feed self Patient Positioning: Upright in chair Baseline Vocal Quality: Normal Volitional Cough: Strong Volitional Swallow: Able to elicit    Oral/Motor/Sensory Function Overall Oral Motor/Sensory Function: Within functional limits   Ice Chips Ice chips: Not tested   Thin Liquid Thin Liquid: Within functional limits Presentation: Cup;Self Fed;Straw    Nectar Thick Nectar Thick Liquid: Not tested   Honey Thick Honey Thick Liquid: Not tested   Puree Puree: Within functional limits Presentation: Self Fed;Spoon   Solid     Solid: Within functional limits Presentation: Self Fed  Mahala Menghini., M.A. CCC-SLP Acute Rehabilitation Services Office [phone]  Secure chat preferred  04/02/2023,11:27 AM

## 2023-04-03 ENCOUNTER — Telehealth: Payer: Self-pay | Admitting: *Deleted

## 2023-04-03 NOTE — Discharge Summary (Signed)
Physician Discharge Summary  Vonnie Prout YQM:[id number] DOB: [birth year] DOA: 03/31/2023  PCP: Shelva Majestic, MD  Admit date: 03/31/2023 Discharge date: 04/02/2023 Admitted From: Home Disposition: Home Recommendations for Outpatient Follow-up:  Outpatient follow-up with PCP and neurology as below Recommend formal cognitive evaluation, CMP and CBC at follow-up Please follow up on the following pending results: None  Home Health: HH PT/OT/SLP  Equipment/Devices: RW/BSC and shower stool  Discharge Condition: Stable CODE STATUS: Full code  Follow-up Information     Shelva Majestic, MD. Schedule an appointment as soon as possible for a visit in 1 week(s).   Specialty: Family Medicine Contact information: [address] [place] [address] [phone]         Marcos Eke, PA-C. Schedule an appointment as soon as possible for a visit in 2 week(s).   Specialty: Neurology Contact information: [address] [place] [address] [phone]         Care, [facility] Follow up.   Specialty: Home Health Services Why: for hh pt ot slp Contact information: [address] [place] [address] [phone]                 Hospital course 82 year old M with PMH of dementia, DM-2, HTN and HLD brought to ED by family due to altered mental status.  Reportedly, he was gurgling and sneezing and patient's wife was not able to arouse him easily early in the morning.  Patient had a MMSE 26/30 in 08/2022.  He was evaluated by his neurologist outpatient on 03/13/2023, and his dementia felt to be stable at that time. Per family, patient was pretty functional before he had COVID and flu shot on 03/15/2023 when he started to have rapid cognitive decline.   In ED, patient was not speaking or following command.  Vital stable.  WBC 15.8.  CXR concerning for atelectasis.  CT chest concerning for lower lung infiltrates.  CT also showed 12  mm lung nodule and right thyroid lobe nodule.  Lactic acid was 1.8 and trended up to 2.5.  LP was attempted but was not successful in ED. LP was not pursued after admission since suspicion for meningitis/encephalitis felt to be less likely.  Blood cultures where obtained.  Started on broad-spectrum antibiotics and admitted for further care.  The next day, blood cultures NGTD.  RVP negative.  Patient was awake and alert but agitated refusing care and evaluation by therapy.  After discussion with patient's wife over the phone, he was started on low-dose Seroquel.   On the day of discharge, remained awake and alert but only oriented to self.  He was not agitated.  Neurology, Dr. Vladimir Creeks consulted about patient's rapid cognitive decline but did not feel the consult is appropriate, and recommended outpatient evaluation after treatment of acute illness.  Patient is discharged on p.o. Augmentin for 3 more days to complete 5 days course for possible aspiration pneumonia.  Also issued prescription for p.o. Seroquel 25 mg at night.  Evaluated by PT/OT/SLP who gave recommendation.  He is discharged with home health PT/OT/SLP, rolling walker, bedside commode and shower stool.  Recommended follow-up with PCP and neurology in 1 to 2 weeks or sooner if needed.  See individual problem list below for more.   Problems addressed during this hospitalization Severe sepsis due to aspiration pneumonia: POA.  Had tachypnea with leukocytosis, lactic acidosis and mental status change on arrival.  Imaging concerning for bilateral lower lobe infiltrates.  Reportedly gurgling and coughing before coming to ED.  COVID-19, influenza and RSV PCR nonreactive.  Full RVP negative.  Blood cultures NGTD.  Leukocytosis and lactic acid resolved.  No respiratory distress.  Lung exam basically normal. -Discharged on p.o. Augmentin for 3 more days to complete a total of 5 days course -SLP recommended regular diet and outpatient SLP at least  once a week.   Acute metabolic encephalopathy/agitation: Multifactorial including sepsis, prolonged delirium delirium with underlying dementia.   His MMSE was 26/30 in 08/2022.  He was evaluated by his neurologist outpatient on 03/13/2023, and his dementia felt to be stable at that time. Per family, patient was pretty functional before he had COVID and flu shot on 03/15/2023.  TSH and B12 within normal. CT head without acute finding but brain atrophy consistent with dementia.  Agitation resolved.  Tolerated p.o. Seroquel 25 mg at night. -Neurology recommended outpatient follow-up after completing treatment for acute illness -P.o. Seroquel 25 mg nightly -Outpatient follow-up with primary neurologist   Dementia with behavioral disturbance: Agitated this morning when therapy tried to assess him.. -Continue home Aricept -P.o. Seroquel as above. -Management as above   Fall in the hospital: Reportedly attempted to get out of the bed to go to bathroom and found on the floor on 11/30..  No apparent injury or focal neurodeficit.  CT head without acute finding. -HH PT/OT, rolling walker, bedside commode and shower stool ordered.   NIDDM-2: A1c 7.4% in 12/2022.  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  Received p.o. KCl 40 x 2 prior to discharge.           Time spent 45  minutes  Vital signs Vitals:   04/01/23 1545 04/01/23 2026 04/02/23 0352 04/02/23 0751  BP: 124/60 (!) 143/70 127/71 134/78  Pulse: 60 (!) 57 (!) 59 (!) 59  Temp: 98.7 F (37.1 C) 99 F (37.2 C) 98.1 F (36.7 C) 97.8 F (36.6 C)  Resp: 18 18 18 16   Height:      Weight:      SpO2: 96% 94% 98% 92%  TempSrc:  Oral Axillary   BMI (Calculated):         Discharge exam  GENERAL: No apparent distress.  Nontoxic. HEENT: MMM.  Vision and hearing grossly intact.  NECK: Supple.  No apparent JVD.  RESP:  No IWOB.  Fair aeration bilaterally. CVS:  RRR. Heart sounds normal.  ABD/GI/GU: BS+. Abd soft, NTND.  MSK/EXT:  Moves extremities. No apparent deformity. No edema.  SKIN: no apparent skin lesion or wound NEURO: Awake and alert. Oriented only to self.  Follows commands.  No apparent focal neuro deficit. PSYCH: Calm. Normal affect.   Discharge Instructions Discharge Instructions     Diet - low sodium heart healthy   Complete by: As directed    Diet Carb Modified   Complete by:  As directed    Discharge instructions   Complete by: As directed    It has been a pleasure taking care of you!  You were hospitalized due to shortness of breath and confusion likely due to aspiration pneumonia/pneumonitis for which you have been started on antibiotics.  We are discharging you on more antibiotics to complete treatment course.  Please review your new medication list and the directions on your medications before you take them.  Follow-up with your primary care doctor and neurologist in 1 to 2 weeks or sooner if needed.   Take care,   Increase activity slowly   Complete by: As directed       Allergies as of 04/02/2023       Reactions   Bactrim [sulfamethoxazole-trimethoprim] Nausea And Vomiting   Pt has chills and fevers also.        Medication List     TAKE these medications    amLODipine 5 MG tablet Commonly known as: NORVASC TAKE 1 TABLET BY MOUTH EVERY DAY    amoxicillin-clavulanate 875-125 MG tablet Commonly known as: AUGMENTIN Take 1 tablet by mouth 2 (two) times daily for 3 days.   [employer] Stomach Relief 262 MG chewable tablet Generic drug: bismuth subsalicylate Chew 2 tablets by mouth 4 (four) times daily.   donepezil 10 MG tablet Commonly known as: ARICEPT TAKE 1 TABLET BY MOUTH EVERYDAY AT BEDTIME What changed: See the new instructions.   ferrous sulfate 325 (65 FE) MG tablet Take 1 tablet (325 mg total) by mouth 2 (two) times daily with a meal.   glipiZIDE 10 MG tablet Commonly known as: GLUCOTROL TAKE 1 TABLET (10 MG TOTAL) BY MOUTH TWICE A DAY BEFORE A MEAL   Gvoke HypoPen 2-Pack 0.5 MG/0.1ML Soaj Generic drug: Glucagon Inject 0.5 mg into the skin daily as needed (for low blood sugar).   metFORMIN 1000 MG tablet Commonly known as: GLUCOPHAGE TAKE 1 TABLET BY MOUTH TWICE A DAY WITH A MEAL   OneTouch Ultra test strip Generic drug: glucose blood USE ONCE DAILY AS INSTRUCTED   QUEtiapine 25 MG tablet Commonly known as: SEROQUEL Take 1 tablet (25 mg total) by mouth at bedtime.   rosuvastatin 20 MG tablet Commonly known as: CRESTOR TAKE 1 TABLET BY MOUTH ONE TIME PER WEEK What changed: See the new instructions.   sitaGLIPtin 100 MG tablet Commonly known as: Januvia Take 1 tablet (100 mg total) by mouth daily.   telmisartan 80 MG tablet Commonly known as: MICARDIS Take 1 tablet (80 mg total) by mouth daily.        Consultations: Neurology over the phone  Procedures/Studies: None   CT HEAD WO CONTRAST ( )  Result Date: 04/01/2023 CLINICAL DATA:  Mental status change with unknown cause. Fall in hospital. EXAM: CT HEAD WITHOUT CONTRAST TECHNIQUE: Contiguous axial images were obtained from the base of the skull through the vertex without intravenous contrast. RADIATION DOSE REDUCTION: This exam was performed according to the departmental dose-optimization program which includes automated exposure control,  adjustment of the mA and/or kV according to patient size and/or use of iterative reconstruction technique. COMPARISON:  Head CTA from yesterday. FINDINGS: Brain: No evidence of acute infarction, hemorrhage, hydrocephalus, extra-axial collection or mass lesion/mass effect. Generalized brain atrophy Vascular: No hyperdense vessel or unexpected calcification. Skull: Normal. Negative for fracture or focal lesion. Sinuses/Orbits: No acute finding. IMPRESSION: Atrophic brain without new or acute finding. Electronically Signed   By: Tiburcio Pea M.D.   On: 04/01/2023 11:00  CT Chest Wo Contrast  Result Date: 03/31/2023 CLINICAL DATA:  Respiratory illness.  Previous lung nodule. EXAM: CT CHEST WITHOUT CONTRAST TECHNIQUE: Multidetector CT imaging of the chest was performed following the standard protocol without IV contrast. RADIATION DOSE REDUCTION: This exam was performed according to the departmental dose-optimization program which includes automated exposure control, adjustment of the mA and/or kV according to patient size and/or use of iterative reconstruction technique. COMPARISON:  PET-CT scan June 2024.  X-ray earlier 03/31/2023. FINDINGS: Cardiovascular: Heart is nonenlarged. Trace pericardial fluid. Coronary artery calcifications are seen. The thoracic aorta overall has a normal course caliber with scattered vascular calcifications on this noncontrast examination. Ascending aorta however has a diameter of 4.1 cm. There is some prominence of the pulmonary arterial tree as well Mediastinum/Nodes: Right-sided thyroid nodule identified. Slightly patulous thoracic esophagus. No discrete abnormal lymph node enlargement identified in the axillary regions, hilum or mediastinum on this noncontrast examination. Lungs/Pleura: Extensive breathing motion throughout the examination. No pneumothorax or effusion. There is some mild patchy opacities along the lung bases. Atelectasis is favored over infiltrate but recommend  follow-up. Previous nodule in the medial left lung base is again seen but poorly defined with motion. Today diameter approaches 12 mm and is unchanged from the previous exam recommend continued follow up in 1 year as previously described. Upper Abdomen: Adrenal glands are preserved in the upper abdomen. There is contrast seen in the renal collecting systems consistent with previous CT angiogram. There is also a cystic lesion along the head of the pancreas again measuring 4.1 x 2.4 cm. Again recommend follow up in 2 years. Musculoskeletal: Stable areas sclerosis along the left humeral head. Mild degenerative changes along the spine. IMPRESSION: Mild parenchymal opacities along bases. Atelectasis is favored over infiltrate recommend follow-up. 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 Recommend annual imaging followup by CTA or MRA. This recommendation follows 2010 ACCF/AHA/AATS/ACR/ASA/SCA/SCAI/SIR/STS/SVM Guidelines for the Diagnosis and Management of Patients with Thoracic Aortic Disease. Circulation. 2010; 121: W098-J191. Aortic aneurysm NOS (ICD10-I71.9) 12 mm stable left lower lobe lung nodule. Based on prior workup would recommend continued follow up surveillance in 1 year. Cystic area along the pancreas once again seen. As per prior recommend follow up imaging in 1-2 years. Small cystic nodule along the right thyroid lobe as seen on the CTA. Recommend thyroid ultrasound when clinically appropriate Aortic Atherosclerosis (ICD10-I70.0). Electronically Signed   By: Karen Kays M.D.   On: 03/31/2023 14:27   CT ANGIO HEAD NECK W WO CM  Result Date: 03/31/2023 CLINICAL DATA:  Neuro deficit, acute, stroke suspected EXAM: CT ANGIOGRAPHY HEAD AND NECK WITH AND WITHOUT CONTRAST TECHNIQUE: Multidetector CT imaging of the head and neck was performed using the standard protocol during bolus administration of intravenous contrast. Multiplanar CT image reconstructions and MIPs were obtained to  evaluate the vascular anatomy. Carotid stenosis measurements (when applicable) are obtained utilizing NASCET criteria, using the distal internal carotid diameter as the denominator. RADIATION DOSE REDUCTION: This exam was performed according to the departmental dose-optimization program which includes automated exposure control, adjustment of the mA and/or kV according to patient size and/or use of iterative reconstruction technique. CONTRAST:  75mL OMNIPAQUE IOHEXOL 350 MG/ML SOLN COMPARISON:  MRI October 30, 2022. FINDINGS: CT HEAD FINDINGS Brain: No evidence of acute infarction, hemorrhage, hydrocephalus, extra-axial collection or mass lesion/mass effect. Cerebral atrophy. Vascular: See below. Skull: No acute fracture. Sinuses/Orbits: Mostly clear sinuses.  No acute orbital findings. Other: No mastoid effusions. Review of the MIP  images confirms the above findings CTA NECK FINDINGS Aortic arch: Aortic atherosclerosis. Great vessel origins are patent. Right carotid system: Atherosclerosis at the carotid bifurcation without greater than 50% stenosis. Left carotid system: Atherosclerosis at the carotid bifurcation without greater than 50% stenosis. Vertebral arteries: Moderate right vertebral artery origin stenosis due to atherosclerosis. Left vertebral arteries patent without significant stenosis. Right dominant. Skeleton: No acute fracture. Other neck: Approximately 1.6 cm right thyroid nodule. Extensive calcifications within the tonsils, presumably related to prior infection/infection. Upper chest: Visualized lung apices are clear. Review of the MIP images confirms the above findings CTA HEAD FINDINGS Anterior circulation: Bilateral intracranial ICAs, MCAs, ACAs are patent without proximal hemodynamically significant stenosis. Mild-to-moderate bilateral intracranial ICA stenosis. Posterior circulation: Mild left intradural vertebral artery stenosis with prominent left PICA. Both intradural vertebral arteries,  basilar artery and bilateral posterior cerebral arteries are patent without proximal hemodynamically significant stenosis. Venous sinuses: Not well assessed due to arterial timing. Review of the MIP images confirms the above findings IMPRESSION: 1. No evidence of acute large vascular territory infarct or acute hemorrhage. 2. No emergent large vessel occlusion or proximal hemodynamically significant stenosis. 3. Approximately 1.6 cm right thyroid nodule. Recommend thyroid US (ref: J Am Coll Radiol. 2015 Feb;12(2): 143-50). 4.  Aortic Atherosclerosis (ICD10-I70.0). Electronically Signed   By: Feliberto Harts M.D.   On: 03/31/2023 12:49   DG Chest Port 1 View  Result Date: 03/31/2023 CLINICAL DATA:  Questionable sepsis.  Evaluate for abnormality. EXAM: PORTABLE CHEST 1 VIEW COMPARISON:  Radiographs 10/09/2015.  PET-CT 10/28/2022. FINDINGS: 1219 hours. The heart size and mediastinal contours are stable with aortic atherosclerosis. There is mild atelectasis or scarring at both lung bases, but no confluent airspace disease, pleural effusion or pneumothorax. No acute osseous findings are evident. Telemetry leads overlie the chest. IMPRESSION: Mild bibasilar atelectasis or scarring. No evidence of pneumonia. Electronically Signed   By: Carey Bullocks M.D.   On: 03/31/2023 12:30   DG Lumbar Spine 2-3 Views  Result Date: 03/29/2023 CLINICAL DATA:  Low back pain for 2 weeks.  No injury. EXAM: LUMBAR SPINE - 2-3 VIEW COMPARISON:  CT abdomen and pelvis, 01/26/2022. FINDINGS: No fracture, bone lesion or spondylolisthesis. Straightened lumbar lordosis. Mild dextroscoliosis, apex at L2-L3. Minor loss of disc height at L2-L3. Mild loss of disc height at L3-L4 with moderate loss of disc height at L4-L5. Mild to moderate loss of disc height at L5-S1. Small anterior endplate osteophytes most evident at L3-L4. Aortic atherosclerotic calcifications. Bowel anastomosis staples in the central pelvis. Soft tissues otherwise  unremarkable. IMPRESSION: 1. No fracture or acute finding. 2. Degenerative changes and mild dextroscoliosis as detailed. Disc degenerative changes at L4-L5 appear progressed when compared to the prior abdomen and pelvis CT. Electronically Signed   By: Amie Portland M.D.   On: 03/29/2023 15:32       The results of significant diagnostics from this hospitalization (including imaging, microbiology, ancillary and laboratory) are listed below for reference.     Microbiology: Recent Results (from the past 240 hour(s))  Blood Culture (routine x 2)     Status: None (Preliminary result)   Collection Time: 03/31/23 11:50 AM   Specimen: BLOOD  Result Value Ref Range Status   Specimen Description BLOOD RIGHT ANTECUBITAL  Final   Special Requests   Final    BOTTLES DRAWN AEROBIC AND ANAEROBIC Blood Culture adequate volume   Culture   Final    NO GROWTH 3 DAYS Performed at [facility], [address] [place], [place] [address]    Report Status PENDING  Incomplete  Resp panel by RT-PCR (RSV, Flu A&B, Covid) Anterior Nasal Swab     Status: None   Collection Time: 03/31/23 12:04 PM   Specimen: Anterior Nasal Swab  Result Value Ref Range Status   SARS Coronavirus 2 by RT PCR NEGATIVE NEGATIVE Final   Influenza A by PCR NEGATIVE NEGATIVE Final   Influenza B by PCR NEGATIVE NEGATIVE Final    Comment: (NOTE) The Xpert Xpress SARS-CoV-2/FLU/RSV plus assay is intended as an aid in the diagnosis of influenza from Nasopharyngeal swab specimens and should not be used as a sole basis for treatment. Nasal washings and aspirates are unacceptable for Xpert Xpress SARS-CoV-2/FLU/RSV testing.  Fact Sheet for Patients: BloggerCourse.com  Fact Sheet for Healthcare Providers: SeriousBroker.it  This test is not yet approved or cleared by the [place] FDA and has been authorized for detection and/or diagnosis of SARS-CoV-2 by FDA under an Emergency  Use Authorization (EUA). This EUA will remain in effect (meaning this test can be used) for the duration of the COVID-19 declaration under Section 564(b)(1) of the Act, 21 U.S.C. section 360bbb-3(b)(1), unless the authorization is terminated or revoked.     Resp Syncytial Virus by PCR NEGATIVE NEGATIVE Final    Comment: (NOTE) Fact Sheet for Patients: BloggerCourse.com  Fact Sheet for Healthcare Providers: SeriousBroker.it  This test is not yet approved or cleared by the [place] FDA and has been authorized for detection and/or diagnosis of SARS-CoV-2 by FDA under an Emergency Use Authorization (EUA). This EUA will remain in effect (meaning this test can be used) for the duration of the COVID-19 declaration under Section 564(b)(1) of the Act, 21 U.S.C. section 360bbb-3(b)(1), unless the authorization is terminated or revoked.  Performed at [facility], [address], [place], [place] [address]   Blood Culture (routine x 2)     Status: None (Preliminary result)   Collection Time: 03/31/23 12:40 PM   Specimen: BLOOD  Result Value Ref Range Status   Specimen Description BLOOD BLOOD RIGHT HAND  Final   Special Requests   Final    AEROBIC BOTTLE ONLY Blood Culture results may not be optimal due to an inadequate volume of blood received in culture bottles   Culture   Final    NO GROWTH 3 DAYS Performed at [facility], [address], [place], [place] [address]    Report Status PENDING  Incomplete  Culture, blood (Routine X 2)     Status: None (Preliminary result)   Collection Time: 03/31/23  7:02 PM   Specimen: BLOOD  Result Value Ref Range Status   Specimen Description BLOOD BLOOD RIGHT ARM  Final   Special Requests   Final    BOTTLES DRAWN AEROBIC AND ANAEROBIC Blood Culture adequate volume   Culture   Final    NO GROWTH 3 DAYS Performed at [facility], [address] [place], [place] [address]     Report Status PENDING  Incomplete  Culture, blood (Routine X 2)     Status: None (Preliminary result)   Collection Time: 03/31/23  7:02 PM   Specimen: BLOOD  Result Value Ref Range Status   Specimen Description BLOOD BLOOD LEFT ARM  Final   Special Requests   Final    BOTTLES DRAWN AEROBIC AND ANAEROBIC Blood Culture adequate volume   Culture   Final    NO GROWTH 3 DAYS Performed at [facility], [address] [place], [place] [address]    Report Status PENDING  Incomplete  Respiratory (~20 pathogens) panel by PCR     Status: None   Collection Time: 04/01/23  7:33 AM   Specimen: Nasopharyngeal Swab; Respiratory  Result Value Ref Range Status   Adenovirus NOT DETECTED NOT DETECTED Final   Coronavirus 229E NOT DETECTED NOT DETECTED Final    Comment: (NOTE) The Coronavirus on the Respiratory Panel, DOES NOT test for the novel  Coronavirus (2019 nCoV)    Coronavirus HKU1 NOT DETECTED NOT DETECTED Final   Coronavirus NL63 NOT DETECTED NOT DETECTED Final   Coronavirus OC43 NOT DETECTED NOT DETECTED Final   Metapneumovirus NOT DETECTED NOT DETECTED Final   Rhinovirus / Enterovirus NOT DETECTED NOT DETECTED Final   Influenza A NOT DETECTED NOT DETECTED Final   Influenza B NOT DETECTED NOT DETECTED Final   Parainfluenza Virus 1 NOT DETECTED NOT DETECTED Final   Parainfluenza Virus 2 NOT DETECTED NOT DETECTED Final   Parainfluenza Virus 3 NOT DETECTED NOT DETECTED Final   Parainfluenza Virus 4 NOT DETECTED NOT DETECTED Final   Respiratory Syncytial Virus NOT DETECTED NOT DETECTED Final   Bordetella pertussis NOT DETECTED NOT DETECTED Final   Bordetella Parapertussis NOT DETECTED NOT DETECTED Final   Chlamydophila pneumoniae NOT DETECTED NOT DETECTED Final   Mycoplasma pneumoniae NOT DETECTED NOT DETECTED Final    Comment: Performed at [facility], [address] [place], [place] [address]     Labs:  CBC: Recent Labs  Lab 03/31/23 1150 03/31/23 1640  04/01/23 0432 04/02/23 0551  WBC 15.8* 17.0* 14.8* 10.0  NEUTROABS 14.8* 15.8*  --   --   HGB 14.4 13.6 11.9* 12.7*  HCT 42.4 38.7* 36.3* 36.3*  MCV 91.2 91.1 93.8 88.8  PLT 218 212 180 168   BMP &GFR Recent Labs  Lab 03/31/23 1150 04/01/23 0432 04/02/23 0551  NA 140 136 138  K 4.3 3.3* 3.1*  CL 102 106 106  CO2 24 25 26   GLUCOSE 281* 122* 127*  BUN 13 13 9   CREATININE 1.16 0.84 0.77  CALCIUM 9.4 8.4* 8.7*  MG  --   --  1.7  PHOS  --   --  3.1   Estimated Creatinine Clearance: 78.1 mL/min (by C-G formula based on SCr of 0.77 mg/dL). Liver & Pancreas: Recent Labs  Lab 03/31/23 1150 04/02/23 0551  AST 17  --   ALT 21  --   ALKPHOS 60  --   BILITOT 1.3*  --   PROT 7.2  --   ALBUMIN 4.2 3.2*   No results for input(s): "LIPASE", "AMYLASE" in the last 168 hours. No results for input(s): "AMMONIA" in the last 168 hours. Diabetic: No results for input(s): "HGBA1C" in the last 72 hours. Recent Labs  Lab 04/01/23 2049 04/01/23 2352 04/02/23 0350 04/02/23 0749 04/02/23 1136  GLUCAP 200* 111* 127* 133* 261*   Cardiac Enzymes: No results for input(s): "CKTOTAL", "CKMB", "CKMBINDEX", "TROPONINI" in the last 168 hours. No results for input(s): "PROBNP" in the last 8760 hours. Coagulation Profile: Recent Labs  Lab 03/31/23 1150  INR 1.1   Thyroid Function Tests: Recent Labs    04/01/23 1400  TSH 1.984   Lipid Profile: No results for input(s): "CHOL", "HDL", "LDLCALC", "TRIG", "CHOLHDL", "LDLDIRECT" in the last 72 hours. Anemia Panel: Recent Labs    04/01/23 1400  VITAMINB12 452   Urine analysis:    Component Value Date/Time   COLORURINE  STRAW (A) 03/31/2023 1332   APPEARANCEUR CLEAR 03/31/2023 1332   LABSPEC 1.032 (H) 03/31/2023 1332   PHURINE 7.0 03/31/2023 1332   GLUCOSEU >=500 (A) 03/31/2023 1332   GLUCOSEU NEGATIVE 01/11/2017 1146   HGBUR NEGATIVE 03/31/2023 1332   HGBUR negative 11/06/2009 0854   BILIRUBINUR NEGATIVE 03/31/2023 1332    BILIRUBINUR negative 12/27/2022 0814   KETONESUR 20 (A) 03/31/2023 1332   PROTEINUR NEGATIVE 03/31/2023 1332   UROBILINOGEN 0.2 12/27/2022 0814   UROBILINOGEN 0.2 01/11/2017 1146   NITRITE NEGATIVE 03/31/2023 1332   LEUKOCYTESUR NEGATIVE 03/31/2023 1332   Sepsis Labs: Invalid input(s): "PROCALCITONIN", "LACTICIDVEN"   SIGNED:  Almon Hercules, MD  [facility] 04/03/2023, 2:10 PM

## 2023-04-03 NOTE — Transitions of Care (Post Inpatient/ED Visit) (Signed)
04/03/2023  Name: [place] Maddocks MRN: [id number] DOB: 1941-03-15  Today's TOC FU Call Status: Today's TOC FU Call Status:: Successful TOC FU Call Completed TOC FU Call Complete Date: 04/03/23 Patient's Name and Date of Birth confirmed.  Transition Care Management Follow-up Telephone Call Date of Discharge: 04/02/23 Discharge Facility: [facility]) Type of Discharge: Inpatient Admission Primary Inpatient Discharge Diagnosis:: Acute metabolic encephalopathy How have you been since you were released from the hospital?: Better (intially patient was very drowsy this morning. Per wife pt had taken seroquel and aricept last night. RN had wife check BP 141/68 p 65 . Patient would wake up and answer wife appropiately and go back to sleep. Patient check BS. BS 200.) Any questions or concerns?: Yes Patient Questions/Concerns:: Pt drowsiness (Patient wife got the patient up and dressed. Once patient started to move around patient normal and felt much better.) Patient Questions/Concerns Addressed: Notified Provider of Patient Questions/Concerns (RN had patient to follow up with the Dr to see if they wanted him to take the seroquel or hold until the office visit.)  Items Reviewed: Did you receive and understand the discharge instructions provided?: Yes Medications obtained,verified, and reconciled?: Yes (Medications Reviewed) Any new allergies since your discharge?: No Dietary orders reviewed?: No Do you have support at home?: Yes People in Home: spouse Name of Support/Comfort Primary Source: [name]  Medications Reviewed Today: Medications Reviewed Today     Reviewed by Luella Cook, RN (Case Manager) on 04/03/23 at 1030  Med List Status: <None>   Medication Order Taking? Sig Documenting Provider Last Dose Status Informant  amLODipine (NORVASC) 5 MG tablet [id number] Yes TAKE 1 TABLET BY MOUTH EVERY DAY Shelva Majestic, MD Taking Active Spouse/Significant Other  amoxicillin-clavulanate  (AUGMENTIN) 875-125 MG tablet [id number] Yes Take 1 tablet by mouth 2 (two) times daily for 3 days. Almon Hercules, MD Taking Active   [employer] STOMACH RELIEF 262 MG chewable tablet [id number] Yes Chew 2 tablets by mouth 4 (four) times daily. [provider] Taking Active Spouse/Significant Other  donepezil (ARICEPT) 10 MG tablet [id number] Yes TAKE 1 TABLET BY MOUTH EVERYDAY AT BEDTIME  Patient taking differently: Take 10 mg by mouth at bedtime.   Van Clines, MD Taking Active Spouse/Significant Other  ferrous sulfate 325 (65 FE) MG tablet [id number] Yes Take 1 tablet (325 mg total) by mouth 2 (two) times daily with a meal. Narda Bonds, MD Taking Active Spouse/Significant Other           Med Note Richardean Chimera Oct 10, 2022  3:41 PM) Taking one/day   glipiZIDE (GLUCOTROL) 10 MG tablet [id number] Yes TAKE 1 TABLET (10 MG TOTAL) BY MOUTH TWICE A DAY BEFORE A MEAL Shelva Majestic, MD Taking Active Spouse/Significant Other  Glucagon (GVOKE HYPOPEN 2-PACK) 0.5 MG/0.1ML SOAJ [id number] Yes Inject 0.5 mg into the skin daily as needed (for low blood sugar). Shelva Majestic, MD Taking Active Spouse/Significant Other  metFORMIN (GLUCOPHAGE) 1000 MG tablet [id number] Yes TAKE 1 TABLET BY MOUTH TWICE A DAY WITH A MEAL Shelva Majestic, MD Taking Active Spouse/Significant Other  ONETOUCH ULTRA test strip [id number] Yes USE ONCE DAILY AS INSTRUCTED Shelva Majestic, MD Taking Active Spouse/Significant Other  QUEtiapine (SEROQUEL) 25 MG tablet [id number] Yes Take 1 tablet (25 mg total) by mouth at bedtime. Almon Hercules, MD Taking Active   rosuvastatin (CRESTOR) 20 MG tablet [id number] Yes TAKE 1 TABLET BY MOUTH ONE TIME PER WEEK  Patient taking  differently: Take 20 mg by mouth daily. Sunday Night   Shelva Majestic, MD Taking Active Spouse/Significant Other  sitaGLIPtin (JANUVIA) 100 MG tablet [id number] Yes Take 1 tablet (100 mg total) by mouth daily. Shelva Majestic, MD Taking Active  Spouse/Significant Other  telmisartan (MICARDIS) 80 MG tablet [id number] Yes Take 1 tablet (80 mg total) by mouth daily. Shelva Majestic, MD Taking Active Spouse/Significant Other            Home Care and Equipment/Supplies: Were Home Health Services Ordered?: Yes Name of Home Health Agency:: [facility] Has Agency set up a time to come to your home?: No EMR reviewed for Home Health Orders: Orders present/patient has not received call (refer to CM for follow-up) (RN gave patient wife number to follow up to see when they are coming) Any new equipment or medical supplies ordered?: NA  Functional Questionnaire: Do you need assistance with bathing/showering or dressing?: Yes Do you need assistance with meal preparation?: Yes Do you have difficulty maintaining continence: No Do you need assistance with getting out of bed/getting out of a chair/moving?: Yes Do you have difficulty managing or taking your medications?: Yes  Follow up appointments reviewed: PCP Follow-up appointment confirmed?: Yes Date of PCP follow-up appointment?: 04/11/23 Follow-up Provider: Jarold Motto PA Specialist [facility] Follow-up appointment confirmed?: No Reason Specialist Follow-Up Not Confirmed: Patient has Specialist Provider Number and will Call for Appointment Do you need transportation to your follow-up appointment?: No Do you understand care options if your condition(s) worsen?: Yes-patient verbalized understanding  SDOH Interventions Today    Flowsheet Row Most Recent Value  SDOH Interventions   Food Insecurity Interventions Intervention Not Indicated  Housing Interventions Intervention Not Indicated  Transportation Interventions Intervention Not Indicated  Utilities Interventions Intervention Not Indicated       Goals Addressed             This Visit's Progress    30 day TOC program       Current Barriers:  Knowledge Deficits related to plan of care for management of DMII and acute  metabolic encephalopathy   RNCM Clinical Goal(s):  Patient will work with the Care Management team over the next 30 days to address Transition of Care Barriers: Medication Management take all medications exactly as prescribed and will call provider for medication related questions as evidenced by EMR demonstrate understanding of rationale for each prescribed medication as evidenced by EMR attend all scheduled medical appointments: PCP and specialist as evidenced by EMR  through collaboration with RN Care manager, provider, and care team.   Interventions: Evaluation of current treatment plan related to  self management and patient's adherence to plan as established by provider   Diabetes Interventions:  (Status:  New goal.) Long Term Goal Assessed patient's understanding of A1c goal: <7% Provided education to patient about basic DM disease process Reviewed scheduled/upcoming provider appointments including: Specialist and [facility] Referral made to community resources care guide team for assistance with PCP follow up appointment scheduled by care guide for  Jarold Motto 04/11/2023 Lab Results  Component Value Date   HGBA1C 7.4 (H) 12/27/2022    Patient Goals/Self-Care Activities: Participate in Transition of Care Program/Attend [facility] scheduled calls Notify RN Care Manager of [facility] call rescheduling needs Take all medications as prescribed Attend all scheduled provider appointments Patient wife to learn how to use the one touch ultra 2 in case patient is unable to check his own blood sugars check blood sugar at prescribed times: once daily take the blood  sugar log to all doctor visits  Follow Up Plan:  Telephone follow up appointment with care management team member scheduled for:  Roshanda Florance  04/11/2023 10:30       TOC interventions discussed/reviewed: -Discussed/reviewed insurance/health plans benefits -Doctor visit discussed/reviewed -PCP -Doctor visits  discussed/reviewed-Specialist -Provided Verbal Education: 30-day TOC program, nutrition, meds & their functions, symptom mgmt., fall/safety measures in the home Patient wife consented to [facility] Coordinator follow up calls Patient wife will follow up with [facility] to see when they will be coming out Patient wife will follow up with PCP to see if patient is to still take the Seroquel since makes him excessively groggy.   Gean Maidens BSN RN Population Health- Transition of Care Team.  [facility] [phone]

## 2023-04-03 NOTE — Progress Notes (Signed)
Low back x-ray shows arthritis and scoliosis worse compared to prior CT scan September 2023.  No broken bones are visible.

## 2023-04-05 ENCOUNTER — Other Ambulatory Visit: Payer: Self-pay | Admitting: Family Medicine

## 2023-04-05 LAB — CULTURE, BLOOD (ROUTINE X 2)
Culture: NO GROWTH
Culture: NO GROWTH
Culture: NO GROWTH
Special Requests: ADEQUATE
Special Requests: ADEQUATE
Special Requests: ADEQUATE

## 2023-04-07 ENCOUNTER — Encounter: Payer: Self-pay | Admitting: Physician Assistant

## 2023-04-07 ENCOUNTER — Ambulatory Visit: Payer: Medicare Other | Admitting: Physician Assistant

## 2023-04-07 ENCOUNTER — Telehealth: Payer: Self-pay | Admitting: Family Medicine

## 2023-04-07 VITALS — BP 127/67 | HR 89 | Resp 20 | Wt 184.0 lb

## 2023-04-07 DIAGNOSIS — F0670 Mild neurocognitive disorder due to known physiological condition without behavioral disturbance: Secondary | ICD-10-CM

## 2023-04-07 DIAGNOSIS — G309 Alzheimer's disease, unspecified: Secondary | ICD-10-CM

## 2023-04-07 NOTE — Telephone Encounter (Signed)
FYI

## 2023-04-07 NOTE — Telephone Encounter (Signed)
Noted-it looks like he is going to see Lelon Mast next week-if we have any openings/cancellation at all next week would be great to slide him to my schedule

## 2023-04-07 NOTE — Progress Notes (Signed)
Assessment/Plan:   Mild Cognitive Impairment, concern for Alzheimer's Disease   Walter [name] is a very pleasant 82 y.o. RH male with a history of hypertension, hyperlipidemia, diabetes, sleep apnea on CPAP, colon cancer s/p resection, prior history of H. pylori,  and a history of mild cognitive impairment, with etiology still concerning for Alzheimer's disease with vascular contribution per neuropsych evaluation 12/2022  presenting today in follow-up prior to his scheduled visit, after seen at the hospital with AMS, "follow when the COVID and flu show up on March 15, 2023 ".  At the hospital, he was found to have severe sepsis due to aspiration pneumonia with tachypnea, leukocytosis, lactic acidosis and mental status change as mentioned above.  He was discharged on antibiotics, he has been stable since.  Acute metabolic encephalopathy and acute delirium was stabilized, although he may be transitioning into dementia.  ED recommended him to be seen sooner.  TSH, B12, and CT of the head were all without acute findings although brain atrophy was noted, hippocampal atrophy was also seen.  He is now on Seroquel tolerating it well.  Patient is on donepezil 10 mg daily.***     Recommendations:   Follow up in   months. Continue donepezil 10 mg daily, side effects discussed. Since he has been recently in the hospital, will not be adding any other antidepression medications in view of potential side effects, however will entertain the use during the next visit on 09/24/2023. Patient is scheduled for repeat neuropsych evaluation in May 2025 for clarity of the diagnosis and disease progression Recommend psychotherapy for PTSD, anxiety, depression, he is on Lexapro as per PCP, Seroquel 25 mg nightly. Recommend checking hearing to improve comprehension Recommend visual examination for reading glasses Continue CPAP for OSA Recommend using a cane for stability Continue ST Recommend good control of  cardiovascular risk factors Continue to control mood as per PCP    Subjective:   This patient is accompanied in the office by his wife***  who supplements the history. Previous records as well as any outside records available were reviewed prior to todays visit.   Patient was last seen on 03/23/23***    Any changes in memory since last visit? ".  He has difficulty with speech, had ASD which was helpful in the past.  Continues to have difficulties retrieving some words, which causes frustration.  He is not doing to do crossword puzzles reading, does not watch much TV. repeats oneself?  Endorsed Disoriented when walking into a room?  Patient denies ***  Misplacing objects?  Patient denies   Wandering behavior?   denies   Any personality changes since last visit?  As before, he becomes frustrated when he cannot remember. Any worsening depression?: denies   Hallucinations or paranoia?  denies   Seizures?   denies    Any sleep changes? Sleeps well***. Does not sleep very well***.   Denies vivid dreams, REM behavior or sleepwalking   Sleep apnea?  Endorsed, uses CPAP.***  Any hygiene concerns?   denies   Independent of bathing and dressing?  Endorsed  Does the patient needs help with medications?  Wife is in charge *** Who is in charge of the finances?  Wife is in charge   *** Any changes in appetite?  denies ***   Patient have trouble swallowing?  denies   Does the patient cook?  No    Any headaches?    denies   Vision changes? denies Chronic pain?  denies  Ambulates with difficulty?  He has right second toe amputation which may cause some gait instability at times.  He is prone to mechanical falls.  He had 1 in the hospital, when he was trying to get out of the bed to go to the bathroom and was found on the floor CT of the head was negative.***  Recent falls or head injuries?    denies      Unilateral weakness, numbness or tingling?   denies   Any tremors?  Chronic, bilateral, mild  intermittent*** Any anosmia?    denies   Any incontinence of urine?  denies   Any bowel dysfunction?  Chronic constipation Patient lives with wife*** Does the patient drive?  Yes, denies any issues, seldom, he uses GPS in an unfamiliar territory.***   Neuropsych evaluation 12/2022  Briefly, results suggested prominent impairment surrounding executive functioning, semantic fluency, confrontation naming, and all aspects of learning and memory. Performance variability was further noted across processing speed and phonemic fluency. Relative to his previous evaluation in August 2022, progressive decline was exhibited across executive functioning, both semantic and phonemic fluency, confrontation naming, and essentially all aspects of learning and memory. This appeared most pronounced surrounding aspects of expressive language. Regarding etiology, I continue to have concern surrounding the presence of underlying Alzheimer's disease. This is based not only on the overall pattern of current deficits, but also given that progressive decline involving the exact areas exhibited by Mr. Monday across the current evaluation follows the expected pattern of progression in this illness. Decline appears relatively mild which could suggest the continued slowed progression of this illness. Expressive language decline does appear more mild to moderate in speed.      History on Initial Assessment 11/25/2019: This is a 82 year old right-handed man with a history of hypertension, hyperlipidemia, diabetes, sleep apnea on CPAP, colon cancer s/p resection, presenting for evaluation of memory loss/word-finding difficulties. He reported symptoms started 2 years ago, MMSE 25/30 in PCP office last 08/2019. He and his wife started noticing more significant changes in the past 1 and 1/2 years. He endorses word-finding difficulties, he tries to tell something and the important part slips by him. He states it is not constant, however his wife  says it happens a lot. He is searching for words and thoughts, unable to remember names of things or restaurants. He is forgetting how to do things sometimes, this is not often, but one time he forgot how to put his car in park (had to push a button). He has had this car for 2 years. He repeats stories. He cannot focus when watching TV and she is speaking to him, unable to multitask. He says "huh" or "what did you say" a lot, and she does not think it is his hearing. He lives with his wife. He denies getting lost driving, no missed ills or medications. He denies misplacing things. His wife has also noticed personality changes. He has a short temper worse than before and gets very nasty. When he cannot think of a word, it makes him nervous and uptight. He gets angry and upset when criticized or when he does not agree. No paranoia or hallucinations, no prior psychiatric history. He states his mood is good. His mother had dementia. No history of significant head injuries. No alcohol use.   He had constipation which resolved with a stool softener. He denies any headaches, dizziness, diplopia, dysarthria/dysphagia, neck/back pain, focal numbness/tingling/weakness, bladder dysfunction, anosmia, or tremors. He sleeps like a  baby with his CPAP machine. 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 All started after a hemorrhoid surgery and later with urinary retention. Patient at one point was septic from urinary issues.     Diabetes mellitus type II, controlled 04/23/2007    Metformin 1g BID, amaryl 4mg --> 8mg , had to  add [place] back  Never took victoza.   Januvia-lethargic and dizzy in past. 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 Follows with Dr. 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   Pulmonary nodule 02/04/2022   Sepsis secondary to UTI 03/29/2015     Past Surgical History:  Procedure Laterality Date   AMPUTATION TOE Right 09/14/2016   Procedure: RIGHT 2ND TOE/RAY AMPUTATION;  Surgeon: Tarry Kos, MD;  Location: [facility];  Service: Orthopedics;  Laterality: Right;   APPENDECTOMY  05/03/1947   BIOPSY  01/28/2022   Procedure: BIOPSY;  Surgeon: Sherrilyn Rist, MD;  Location:  [facility] ENDOSCOPY;  Service: Gastroenterology;;   COLECTOMY  05/02/1990   CYSTOSCOPY N/A 06/21/2015   Procedure: CYSTOSCOPY, CLOT EVACUATION, AND CAUTERIZATION OF PROSTATE FOSSA;  Surgeon: Jethro Bolus, MD;  Location: [facility];  Service: Urology;  Laterality: N/A;   ESOPHAGOGASTRODUODENOSCOPY (EGD) WITH PROPOFOL N/A 01/26/2022   Procedure: ESOPHAGOGASTRODUODENOSCOPY (EGD) WITH PROPOFOL;  Surgeon: Sherrilyn Rist, MD;  Location: [facility] ENDOSCOPY;   Service: Gastroenterology;  Laterality: N/A;   ESOPHAGOGASTRODUODENOSCOPY (EGD) WITH PROPOFOL N/A 01/28/2022   Procedure: ESOPHAGOGASTRODUODENOSCOPY (EGD) WITH PROPOFOL;  Surgeon: Sherrilyn Rist, MD;  Location: [facility] END[facility]COPY;  Service: Gastroenterology;  Laterality: N/A;   HEMORRHOID SURGERY     HEMOSTASIS CLIP PLACEMENT  01/26/2022   Procedure: HEMOSTASIS CLIP PLACEMENT;  Surgeon: Sherrilyn Rist, MD;  Location: [facility] ENDOSCOPY;  Service: Gastroenterology;;   HEMOSTASIS CONTROL  01/26/2022   Procedure: HEMOSTASIS CONTROL;  Surgeon: Sherrilyn Rist, MD;  Location: [facility] ENDOSCOPY;  Service: Gastroenterology;;   HOT HEMOSTASIS N/A 01/26/2022   Procedure: HOT HEMOSTASIS (ARGON PLASMA COAGULATION/BICAP);  Surgeon: Sherrilyn Rist, MD;  Location: [facility] ENDOSCOPY;  Service: Gastroenterology;  Laterality: N/A;   MOLE REMOVAL  2022   hand   SCLEROTHERAPY  01/26/2022   Procedure: SCLEROTHERAPY;  Surgeon: Sherrilyn Rist, MD;  Location: [facility] ENDOSCOPY;  Service: Gastroenterology;;   TOE SURGERY  04/2020   TRANSURETHRAL RESECTION OF PROSTATE N/A 05/23/2015   Procedure: TRANSURETHRAL RESECTION OF THE PROSTATE (TURP);  Surgeon: Bjorn Pippin, MD;  Location: [facility];  Service: Urology;  Laterality: N/A;     PREVIOUS MEDICATIONS:   CURRENT MEDICATIONS:  Outpatient Encounter Medications as of 04/07/2023  Medication Sig   amLODipine (NORVASC) 5 MG tablet TAKE 1 TABLET BY MOUTH EVERY DAY   [employer] STOMACH RELIEF 262 MG chewable tablet Chew 2 tablets by mouth 4 (four) times daily.   donepezil (ARICEPT) 10 MG tablet TAKE 1 TABLET BY MOUTH EVERYDAY AT BEDTIME (Patient taking differently: Take 10 mg by mouth at bedtime.)   ferrous sulfate 325 (65 FE) MG tablet Take 1 tablet (325 mg total) by mouth 2 (two) times daily with a meal.   glipiZIDE (GLUCOTROL) 10 MG tablet TAKE 1 TABLET (10 MG TOTAL) BY MOUTH TWICE A DAY BEFORE A MEAL   Glucagon (GVOKE HYPOPEN 2-PACK) 0.5 MG/0.1ML SOAJ Inject 0.5 mg into the skin daily as  needed (for low blood sugar).   metFORMIN (GLUCOPHAGE) 1000 MG tablet TAKE 1 TABLET BY MOUTH TWICE A DAY WITH A MEAL   ONETOUCH ULTRA test strip USE ONCE DAILY AS INSTRUCTED   QUEtiapine (SEROQUEL) 25 MG tablet Take 1 tablet (25 mg total) by mouth at bedtime.   rosuvastatin (CRESTOR) 20 MG tablet TAKE 1 TABLET BY MOUTH ONE TIME PER WEEK (Patient taking differently: Take 20 mg by mouth daily. Sunday Night)   sitaGLIPtin (JANUVIA) 100 MG tablet Take 1 tablet (100 mg total) by mouth daily.   telmisartan (MICARDIS) 80 MG tablet Take 1 tablet (80 mg total) by mouth daily.   No facility-administered encounter medications on file as of 04/07/2023.     Objective:     PHYSICAL EXAMINATION:    VITALS:  There were no vitals filed for this visit.  GEN:  The patient appears stated age and is in NAD. HEENT:  Normocephalic, atraumatic.   Neurological examination:  General: NAD, well-groomed, appears stated age. Orientation: The patient is alert. Oriented to person, place and date Cranial nerves: There is good facial symmetry.The speech is not fluent and clear, pausing to find the right word. No aphasia or dysarthria. Fund of knowledge is appropriate. Recent memory impaired and remote memory is normal.  Attention and concentration are normal.  Able to name objects and repeat phrases.  Hearing is decreased to conversational tone ***.   Delayed recall *** Sensation: Sensation is intact to light touch throughout. R 2nd toe amputation Motor: Strength is at least antigravity x4. DTR's 2/4 in UE/LE       No data to display             09/20/2022    5:00 PM 09/02/2021   11:00 AM 02/19/2016   10:30 AM  MMSE - Mini Mental State Exam  Not completed:   --  Orientation to time 3 5   Orientation to Place 5 5   Registration 3 3   Attention/ Calculation 5 5   Recall 1 0   Language- name 2 objects 2 2   Language- repeat 1 1   Language- follow 3 step command 3 3   Language- read & follow direction 1 1    Write a sentence 1 1   Copy design 1 1   Total score 26 27  Movement examination: Tone: There is normal tone in the UE/LE Abnormal movements:  no tremor.  No myoclonus.  No asterixis.   Coordination:  There is no decremation with RAM's. Normal finger to nose  Gait and Station: The patient has no difficulty arising out of a deep-seated chair without the use of the hands. The patient's stride length is short.  Gait is cautious and narrow.   Thank you for allowing [place] the opportunity to participate in the care of this nice patient. Please do not hesitate to contact us for any questions or concerns.   Total time spent on today's visit was *** minutes dedicated to this patient today, preparing to see patient, examining the patient, ordering tests and/or medications and counseling the patient, documenting clinical information in the EHR or other health record, independently interpreting results and communicating results to the patient/family, discussing treatment and goals, answering patient's questions and coordinating care.  Cc:  Shelva Majestic, MD  Marlowe Kays 04/07/2023 6:50 AM

## 2023-04-07 NOTE — Telephone Encounter (Signed)
Kelly with [facility] states Patient got outside by himself 04/06/23 and fell-Patient reported he fell multiple times, wife was not present at the time. Patient has skin abrasions on the right side of his face, no other pain. Wife reported no increased confusion.

## 2023-04-08 ENCOUNTER — Encounter: Payer: Self-pay | Admitting: Family Medicine

## 2023-04-11 ENCOUNTER — Ambulatory Visit: Payer: Medicare Other | Admitting: Family Medicine

## 2023-04-11 ENCOUNTER — Encounter: Payer: Self-pay | Admitting: Family Medicine

## 2023-04-11 ENCOUNTER — Telehealth: Payer: Self-pay | Admitting: Family Medicine

## 2023-04-11 ENCOUNTER — Other Ambulatory Visit: Payer: Self-pay

## 2023-04-11 VITALS — BP 102/60 | HR 80 | Temp 97.2°F | Ht 72.0 in | Wt 179.2 lb

## 2023-04-11 DIAGNOSIS — H9193 Unspecified hearing loss, bilateral: Secondary | ICD-10-CM

## 2023-04-11 DIAGNOSIS — F0390 Unspecified dementia without behavioral disturbance: Secondary | ICD-10-CM | POA: Diagnosis not present

## 2023-04-11 DIAGNOSIS — E119 Type 2 diabetes mellitus without complications: Secondary | ICD-10-CM

## 2023-04-11 DIAGNOSIS — G9341 Metabolic encephalopathy: Secondary | ICD-10-CM

## 2023-04-11 DIAGNOSIS — I10 Essential (primary) hypertension: Secondary | ICD-10-CM

## 2023-04-11 DIAGNOSIS — J690 Pneumonitis due to inhalation of food and vomit: Secondary | ICD-10-CM

## 2023-04-11 DIAGNOSIS — D509 Iron deficiency anemia, unspecified: Secondary | ICD-10-CM | POA: Diagnosis not present

## 2023-04-11 DIAGNOSIS — Z7984 Long term (current) use of oral hypoglycemic drugs: Secondary | ICD-10-CM

## 2023-04-11 DIAGNOSIS — E041 Nontoxic single thyroid nodule: Secondary | ICD-10-CM

## 2023-04-11 NOTE — Patient Instructions (Signed)
Visit Information  Thank you for taking time to visit with me today. Please don't hesitate to contact me if I can be of assistance to you before our next scheduled telephone appointment.  Our next appointment is by telephone on 04/18/23 at 10:30 am  Following is a copy of your care plan:   Goals Addressed             This Visit's Progress    TOC Care Plan       Current Barriers:  Knowledge Deficits related to plan of care for management of DMII and dementia   RNCM Clinical Goal(s):  Patient will work with the Care Management team over the next 30 days to address Transition of Care Barriers: mgmt of chronic conditons take all medications exactly as prescribed and will call provider for medication related questions as evidenced by EMR demonstrate understanding of rationale for each prescribed medication as evidenced by EMR attend all scheduled medical appointments: PCP and specialist as evidenced by EMR  through collaboration with RN Care manager, provider, and care team.   Interventions: Evaluation of current treatment plan related to  self management and patient's adherence to plan as established by provider   Diabetes Interventions:  (Status:  Goal on track:  Yes.) Short Term Goal Assessed patient's understanding of A1c goal: <7% Reviewed medications with patient and discussed importance of medication adherence Reviewed scheduled/upcoming provider appointments including: Specialist and [facility] Review of patient status, including review of consultants reports, relevant laboratory and other test results, and medications completed Assessed social determinant of health barriers Reviewed cbg log with wife who has learned how to check his blood sugars and has been recording values- cbg yest 154-she has not checked it yet today Lab Results  Component Value Date   HGBA1C 7.4 (H) 12/27/2022     Dementia:  (Status:  New goal.)  Short Term Goal Evaluation of current treatment plan  related to misuse of: Alzheimer's dementia Reviewed medications including any OTC meds being taken, Emotional Support Provided to patient/caregiver, Sleep assessment completed, Encouraged patient/caregiver counseling/support: wife is dealing with the drastic change/decline in pt's condition -limited support system, Consideration of in-home help encouraged , Discussed importance of discussing diagnosis with provider, and Advised to contact provider for new or worsening symptoms -Discussed importance of possible in home support ot alleviate caregiver burnout/fatigue- pt does not have LTC insurance policy per wife, 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-provided with [facility] Care[facility] Support [facility] [phone]-wife will call to  inquire if pt eligible, she reports no church or neighbors support as an option, discussed out of pocket expense for PCS-offered to refer and make appt with SW to further explore and discuss options-she will think about it and call RN CM back -Confirmed HH services in place-[facility]- PT,OT,ST- wife states PT visited yest-encouraged her to consider getting HH aide services added, she thinks she may be getting a SW with HH but not sure -Discussed and reviewed safety in the home due to pt's mental status- wife has put measures in place to try to keep pt safe, alarm system so he does not wander outside, she voices pt's gait/mobility is still good-not using any DME at present to walk -Discussed with wife that pt did complete neuro appt last week and is going to PCP appt this afternoon   Patient Goals/Self-Care Activities: Participate in Transition of Care Program/Attend [facility] scheduled calls Notify RN Care Manager of TOC call rescheduling needs Take all medications as  prescribed Attend all scheduled provider appointments Call provider office for new concerns or questions  Maintain safety in the home-report no falls Wife il onsider speaking with SW regarding  in home support services/assistance/resources  Follow Up Plan:  Telephone follow up appointment with care management team member scheduled for:  04/18/23-10:30 am The patient has been provided with contact information for the care management team and has been advised to call with any health related questions or concerns.          Patient verbalizes understanding of instructions and care plan provided today and agrees to view in MyChart. Active MyChart status and patient understanding of how to access instructions and care plan via MyChart confirmed with patient.     The patient has been provided with contact information for the care management team and has been advised to call with any health related questions or concerns.   Please call the care guide team at [phone] if you need to cancel or reschedule your appointment.   Please call the [place] National Suicide Prevention Lifeline: (406)103-5277 or TTY: 629-010-7955 TTY (670) 556-8610) to talk to a trained counselor call 1-800-273-TALK (toll free, 24 hour hotline) if you are experiencing a Mental Health or Behavioral Health Crisis or need someone to talk to.  Antionette Fairy, RN,BSN,CCM RN Care Manager Transitions of Care  [facility]  Direct Phone: [phone] Toll Free: [phone] Fax: [phone]

## 2023-04-11 NOTE — Patient Instructions (Addendum)
We will call you within two weeks about your referral to thyroid ultrasound through [facility].  Their phone number is [phone].  Please call them if you have not heard in 1-2 weeks  We have placed a referral for you today to audiology. In some cases you will see # listed below- you can call this if you have not heard within a week. If you do not see # listed- you should receive a mychart message or phone call within a week with the # to call directly- call that as soon as you get it. If you are having issues getting scheduled reach out to [place] again.   Please stop by lab before you go If you have mychart- we will send your results within 3 business days of [place] receiving them.  If you do not have mychart- we will call you about results within 5 business days of [place] receiving them.  *please also note that you will see labs on mychart as soon as they post. I will later go in and write notes on them- will say "notes from Dr. Durene Cal"   Recommended follow up: Return for next already scheduled visit or sooner if needed.

## 2023-04-11 NOTE — Assessment & Plan Note (Signed)
Thyroid nodule noted during hospitalization-thyroid ultrasound was recommended and this was ordered today

## 2023-04-11 NOTE — Telephone Encounter (Signed)
Received faxed document  Home Health Certificate (Order ID [id number] ), to be filled out by provider. Patient requested to send it back via Fax within 5-days. Document is located in providers tray at front office.Please advise .

## 2023-04-11 NOTE — Progress Notes (Signed)
Phone [phone]   Subjective:  Walter [name] is a 82 y.o. year old very pleasant male patient who presents for transitional care management and hospital follow up for acute metabolic encephalopathy thought related to pneumonia with underlying mild cognitive impairment versus dementia. Patient was hospitalized from 03/31/2023 to 04/02/2023. A TCM phone call was completed on 04/03/2023. Medical complexity moderate  The patient presented to the hospital with altered mental status.  His wife reported he had been gurgling and sneezing and wife was having difficulty awakening him- drastic drop off for him but had been having steadily declining mental faculties from the day he received both flu and COVID shot together on 03/15/23- for instance day after thanksgiving was trying to play a game and was moving the dice instead of the men- a game he knows well- next morning is when issues occured. Did have a fall in shower before hospital- doesn't think hit head but not sure- head CT was reassuring as below though.  At baseline patient has mild cognitive impairment and follows with neurology.   Initially in the emergency department patient was not following commands.  White blood count elevated to 15,000.  Chest x-ray concerning for pneumonia with bilateral lower lobe infiltrates (in context of gurgling and coughing before admission concern for aspiration pneumonia).  CT chest concerning for lower lung infiltrates.  CT also showed a 12 mm lung nodule in right thyroid lobe nodule- they are ok with me ordering follow up imaging today.  Lactic acid was 1.8 and trended up to 2.5.  Lumbar puncture was attempted but not successful emergency department.  After admission meningitis/encephalitis thought less likely and repeat lumbar puncture was not reattempted.  Blood cultures were obtained.  He was started on broad-spectrum antibiotics with concerns for bacterial infection particular pneumonia causing altered mental  status/delirium and he was admitted. -Of note in regards to memory- a B12 and TSH were normal -also of note CT head 04/01/23 with no acute changes  Blood cultures eventually came back negative.  Respiratory viral panel was negative.  Patient became more alert but also refused care at times including evaluation by therapy.  He had to be started on low-dose Seroquel and this was found to be helpful.  He was awake and alert on discharge but only oriented to self.  Neurology was consulted about rapid decline but neurologist Dr. Erick Blinks declined to consult as did not feel it was appropriate and recommended outpatient evaluation after treatment of acute illness. -Ultimately patient was sent home with 3 additional days of Augmentin with concern for aspiration pneumonia -Seroquel was recommended to be maintained on outpatient basis -SLP recommended once a week visits but regular diet.  Patient has already seen neurology on 04/07/2023-no changes were made to his treatment.  Plan was for 51-month follow-up including neuropsychological testing beforehand.  They agreed with my prior recommendation of cutting Seroquel in half due to somnolence in the daytime but apparently family had not received my message.  Since patient has not been agitated at home Seroquel has not been given. 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 Prior to this illness- he was driving wife to her appointments, taking care of his own appointments, managing own food and medicine, very independent. Since this hospitalization- wife is needing to manage his food and pills which is new. He needs help with bathing etc. Has fallen once at home which is atypical for him- wife found him walking outside with no coat on a very cold night and he had abrasion on his head on the right side- she knew he had fallen- he came around looking exhausted. Had been going out to address that was down but did this at nighttime- thinks he is able to do what he could do before at least until that point- has been inside more since that time.    See problem oriented charting as well  Past Medical History-  Patient Active Problem List   Diagnosis Date Noted   Duodenal ulcer hemorrhage     Priority: High   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    Priority: High   BPH (benign prostatic hyperplasia) 07/02/2015    Priority: High   Diabetes mellitus type II, controlled 04/23/2007    Priority: High   Pulmonary nodule 02/04/2022    Priority: Medium    History of colon cancer 01/27/2014    Priority: Medium    Hyperlipidemia associated with type 2 diabetes mellitus 04/23/2007    Priority: Medium    Obstructive sleep apnea 04/23/2007    Priority: Medium    Essential hypertension 04/23/2007    Priority: Medium    Allergic rhinitis 08/19/2019    Priority: Low   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    Priority: Low   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    Priority: Low   Hallux valgus of right foot 03/26/2014     Priority: Low   Iritis 12/17/2008    Priority: Low   Postherpetic neuralgia 05/22/2008    Priority: Low   Bigeminy 04/21/2008    Priority: Low   Dementia with behavioral disturbance (HCC) 04/01/2023   Severe sepsis (HCC) 03/31/2023   Acute metabolic encephalopathy 03/31/2023   Thyroid nodule 03/31/2023   Lactic acidosis 03/31/2023   History of dysplastic nevus    History of skin cancer    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 Current Outpatient Medications  Medication Sig Dispense Refill   amLODipine (NORVASC) 5 MG tablet TAKE 1 TABLET BY MOUTH EVERY DAY 90 tablet 3   [employer] STOMACH RELIEF 262 MG chewable tablet Chew 2 tablets by mouth 4 (four) times daily.     donepezil (ARICEPT) 10 MG tablet TAKE 1 TABLET BY MOUTH EVERYDAY AT BEDTIME (Patient taking differently: Take 10 mg by mouth at bedtime.) 90 tablet 1   ferrous sulfate 325 (65 FE) MG tablet Take 1 tablet (325 mg total) by mouth 2 (two) times daily with a meal.     glipiZIDE (GLUCOTROL) 10 MG tablet TAKE 1 TABLET (10 MG TOTAL) BY MOUTH  TWICE A DAY BEFORE A MEAL 180 tablet 3   Glucagon (GVOKE HYPOPEN 2-PACK) 0.5 MG/0.1ML SOAJ Inject 0.5 mg into the skin daily as needed (for low blood sugar). 0.2 mL 1   metFORMIN (GLUCOPHAGE) 1000 MG tablet TAKE 1 TABLET BY MOUTH TWICE A DAY WITH A MEAL 180 tablet 3   ONETOUCH ULTRA test strip USE ONCE DAILY AS INSTRUCTED 100 strip 3   QUEtiapine (SEROQUEL) 25 MG tablet Take 1 tablet (25 mg total) by mouth at bedtime. 30 tablet 2   rosuvastatin (CRESTOR) 20 MG tablet TAKE 1 TABLET BY MOUTH ONE TIME PER WEEK (Patient taking differently: Take 20 mg by mouth daily. Sunday Night) 13 tablet 3   sitaGLIPtin (JANUVIA) 100 MG tablet Take 1 tablet (100 mg total) by mouth daily. 90 tablet 3   telmisartan (MICARDIS) 80 MG tablet Take 1 tablet  (80 mg total) by mouth daily. 90 tablet 3   No current facility-administered medications for this visit.   Objective  Objective:  BP 102/60   Pulse 80   Temp (!) 97.2 F (36.2 C)   Ht 6' (1.829 m)   Wt 179 lb 3.2 oz (81.3 kg)   SpO2 95%   BMI 24.30 kg/m  Gen: NAD, resting comfortably CV: RRR no murmurs rubs or gallops Lungs: CTAB no crackles, wheeze, rhonchi Abdomen: soft/nontender/nondistended/normal bowel sounds. No rebound or guarding.  Ext: no edema Skin: warm, dry, abrasion down right side of face healing now- almost C shape Neuro: walks with walker, substantially slowed cognition compared to prior   Assessment and Plan:   Assessment & Plan Aspiration pneumonia of both lower lobes due to gastric secretions [facility]) Patient has completed appropriate treatment for this.  We will see him back next month and plan to order chest x-ray to show clearance Metabolic encephalopathy Despite resolution of acute metabolic encephalopathy substantial decline in cognitive abilities Dementia Patient with mild cognitive impairment but substantial decline steadily starting around November 13 when he received flu and COVID shot together then with a more profound decline with like aspiration pneumonia event.  He has not recovered as much as I would like-I am going to reach out to neurology for their opinion on neuroimaging although he did have a reassuring head CT to evaluate for stroke more thoroughly.  Also ask with level of decline if appropriate to see Dr. Mayme Genta requesting -He has possible prostate cancer with Dr. Annabell Howells and has pending prostate biopsy but with his decline in cognitive status I would like to push this out likely at least in the 3 to 61-month range -Also had upcoming colonoscopy for change in stool caliber with Dr. Russella Dar likely at least 3 to 6 months -Wife has her own medical problems and he is profoundly dependent on her at this point going from independent to very dependent  rather quickly-going to place CCM referral for support.  Also get [facility] about [facility] with [facility] history to help access all possible resources -Previous agitation but no longer having that has Seroquel on hand and will only take half tablet if needed in the future Bilateral hearing loss, unspecified hearing loss type With level of memory change I completely agree with neurology on evaluating his hearing-referral to audiology was placed Controlled type 2 diabetes mellitus without complication, without long-term current use of insulin (HCC) With memory changes likely tolerate A1c up to even 8-for now continue glipizide 10 mg twice daily, metformin 1000 mg twice daily and Januvia 100 mg-with change in cognition  we may need to reconsider glipizide as he may be less likely to report hypoglycemic symptoms Lab Results  Component Value Date   HGBA1C 7.4 (H) 12/27/2022   HGBA1C 7.8 (H) 08/23/2022   HGBA1C 8.3 (H) 04/21/2022   Iron deficiency anemia, unspecified iron deficiency anemia type Have planned colonoscopy and reportedly has been off of iron-update iron levels and encouraged him to restart iron with anemia noted in the hospital Thyroid nodule Thyroid nodule noted during hospitalization-thyroid ultrasound was recommended and this was ordered today Essential hypertension Well-controlled today on amlodipine 5 mg, telmisartan 80 mg-has run higher at other visits-if persistently this low in the future may need to reduce dose   Recommended follow up: Return for next already scheduled visit or sooner if needed. Future Appointments  Date Time Provider Department Center  04/12/2023  1:00 PM [facility] [place] [facility]  04/18/2023 10:30 AM Florance, Adrienne Mocha, RN [facility] None  04/25/2023  8:30 AM Meryl Dare, MD [facility]  05/04/2023  9:20 AM Shelva Majestic, MD [facility] P[facility]  05/18/2023 10:00 AM [facility] ANNUAL WELLNESS VISIT 1 [facility] PEC  06/01/2023  8:45  AM Vivi Barrack, DPM [facility]  07/05/2023  8:20 AM Shelva Majestic, MD [facility] PEC  09/29/2023 11:30 AM Marcos Eke, PA-C LBN-LBNG None  11/01/2023  1:00 PM [name], PhD LBN[facility] None  11/01/2023  2:00 PM LBN- NEUROPSYCH TECH LBN-LBNG None  11/08/2023  2:30 PM [name], PhD LBN[facility]LB[facility] None    Lab/Order associations:   ICD-10-CM   1. Aspiration pneumonia of both lower lobes due to gastric secretions (HCC)  J69.0     2. Metabolic encephalopathy  G93.41     3. Dementia  F03.90 AMB Referral VBCI Care Management    4. Bilateral hearing loss, unspecified hearing loss type  H91.93 Ambulatory referral to Audiology    5. Controlled type 2 diabetes mellitus without complication, without long-term current use of insulin (HCC)  E11.9 Urine Microalbumin w/creat. ratio    Comprehensive metabolic panel    CBC with Differential/Platelet    HgB A1c    AMB Referral [facility]BCI Care Management    6. Iron deficiency anemia, unspecified iron deficiency anemia type  D50.9 IBC + Ferritin    7. Thyroid nodule  E04.1 US THYROID    8. Essential hypertension  I10      No orders of the defined types were placed in this encounter.  This visit will qualify as a [phone] based on time if does not meet qualifications for [id number]  Time Spent: 51 minutes of total time (2:30 PM-3:21 PM) was spent on the date of the encounter performing the following actions: chart review prior to seeing the patient including review of discharge summary, obtaining history, performing a medically necessary exam, counseling on the treatment plan and concerns about hospitalization, reaching out to GI, urology, neurology, placing orders, and documenting in our EHR.   Return precautions advised.  Tana Conch, MD

## 2023-04-11 NOTE — Patient Outreach (Signed)
Care Management  Transitions of Care Program Transitions of Care Post-discharge week 2   04/11/2023 Name: Walter [name] MRN: [id number] DOB: [birth year]  Subjective: Walter [name] is a 82 y.o. year old male who is a primary care patient of Walter [name], Walter Contes, MD. The Care Management team   Engaged with [name] by phone  to assess and address transitions of care needs.   Consent to Services:  Wife agreeable to [facility] calls ad services  Assessment:   Wife voices pt is doing okay. She is still trying to adjust to his rapid and drastic decline in mental and functional status. She feels strongly that it was related to pt getting both flu & COVID vaccination at the same time .She voices she is a "little stronger this week but memory still not 100%." He is sleeping and eating well. Pt still able to control bowels and bladder. No recent falls-ambulating with no DME. States she stopped giving the Seroquel after initial dose and dicussed with neuro during recent appt an provider was ok with her doing so. Continued support and education given to spouse regarding disease mgmt and managing pt's care in the home. Wife provided with RN CM contact info-aware that she can call as needed.        SDOH Interventions    Flowsheet Row Patient Outreach from 04/11/2023 in [place] [facility] Telephone from 04/03/2023 in [place] [facility] Clinical Support from 05/12/2022 in [facility] [place]  SDOH Interventions     Food Insecurity Interventions Intervention Not Indicated Intervention Not Indicated Intervention Not Indicated  Housing Interventions Intervention Not Indicated Intervention Not Indicated Intervention Not Indicated  Transportation Interventions Intervention Not Indicated Intervention Not Indicated Intervention Not Indicated  Utilities Interventions Intervention Not Indicated Intervention Not Indicated Intervention Not Indicated  Financial  Strain Interventions -- -- Intervention Not Indicated  Physical Activity Interventions -- -- Intervention Not Indicated  Stress Interventions -- -- Intervention Not Indicated  Social Connections Interventions -- -- Intervention Not Indicated        Goals Addressed             This Visit's Progress    TOC Care Plan       Current Barriers:  Knowledge Deficits related to plan of care for management of DMII and dementia   RNCM Clinical Goal(s):  Patient will work with the Care Management team over the next 30 days to address Transition of Care Barriers: mgmt of chronic conditons take all medications exactly as prescribed and will call provider for medication related questions as evidenced by EMR demonstrate understanding of rationale for each prescribed medication as evidenced by EMR attend all scheduled medical appointments: PCP and specialist as evidenced by EMR  through collaboration with RN Care manager, provider, and care team.   Interventions: Evaluation of current treatment plan related to  self management and patient's adherence to plan as established by provider   Diabetes Interventions:  (Status:  Goal on track:  Yes.) Short Term Goal Assessed patient's understanding of A1c goal: <7% Reviewed medications with patient and discussed importance of medication adherence Reviewed scheduled/upcoming provider appointments including: Specialist and [facility] Review of patient status, including review of consultants reports, relevant laboratory and other test results, and medications completed Assessed social determinant of health barriers Reviewed cbg log with wife who has learned how to check his blood sugars and has been recording values- cbg yest 154-she has not checked it yet today Lab Results  Component Value Date   HGBA1C 7.4 (H) 12/27/2022     Dementia:  (Status:  New goal.)  Short Term Goal Evaluation of current treatment plan related to misuse of: Alzheimer's  dementia Reviewed medications including any OTC meds being taken, Emotional Support Provided to patient/caregiver, Sleep assessment completed, Encouraged patient/caregiver counseling/support: wife is dealing with the drastic change/decline in pt's condition -limited support system, Consideration of in-home help encouraged , Discussed importance of discussing diagnosis with provider, and Advised to contact provider for new or worsening symptoms -Discussed importance of possible in home support ot alleviate caregiver burnout/fatigue- pt does not have LTC insurance policy per wife, 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-provided with [facility] Caregiver Support Line [phone]-wife will call to  inquire if pt eligible, she reports no church or neighbors support as an option, discussed out of pocket expense for PCS-offered to refer and make appt with SW to further explore and discuss options-she will think about it and call RN CM back -Confirmed HH services in place-[facility]- PT,OT,ST- wife states PT visited yest-encouraged her to consider getting HH aide services added, she thinks she may be getting a SW with HH but not sure -Discussed and reviewed safety in the home due to pt's mental status- wife has put measures in place to try to keep pt safe, alarm system so he does not wander outside, she voices pt's gait/mobility is still good-not using any DME at present to walk -Discussed with wife that pt did complete neuro appt last week and is going to PCP appt this afternoon   Patient Goals/Self-Care Activities: Participate in Transition of Care Program/Attend [facility] scheduled calls Notify RN Care Manager of TOC call rescheduling needs Take all medications as prescribed Attend all scheduled provider appointments Call provider office for new concerns or questions  Maintain safety in the home-report no falls Wife il onsider speaking with SW regarding in home support  services/assistance/resources  Follow Up Plan:  Telephone follow up appointment with care management team member scheduled for:  04/18/23-10:30 am The patient has been provided with contact information for the care management team and has been advised to call with any health related questions or concerns.          Plan: The patient has been provided with contact information for the care management team and has been advised to call with any health related questions or concerns.  Follow up appt scheduled with wife for 04/18/23-10:30 am.   Antionette Fairy, RN,BSN,CCM RN Care Manager Transitions of Care  [place]-[facility]BCI/Population Health  Direct Phone: [phone] Toll Free: [phone] Fax: [phone]

## 2023-04-11 NOTE — Assessment & Plan Note (Signed)
With memory changes likely tolerate A1c up to even 8-for now continue glipizide 10 mg twice daily, metformin 1000 mg twice daily and Januvia 100 mg-with change in cognition we may need to reconsider glipizide as he may be less likely to report hypoglycemic symptoms Lab Results  Component Value Date   HGBA1C 7.4 (H) 12/27/2022   HGBA1C 7.8 (H) 08/23/2022   HGBA1C 8.3 (H) 04/21/2022

## 2023-04-11 NOTE — Assessment & Plan Note (Signed)
Well-controlled today on amlodipine 5 mg, telmisartan 80 mg-has run higher at other visits-if persistently this low in the future may need to reduce dose

## 2023-04-11 NOTE — Assessment & Plan Note (Signed)
Patient has completed appropriate treatment for this.  We will see him back next month and plan to order chest x-ray to show clearance

## 2023-04-12 ENCOUNTER — Telehealth: Payer: Self-pay

## 2023-04-12 ENCOUNTER — Inpatient Hospital Stay
Admission: RE | Admit: 2023-04-12 | Discharge: 2023-04-12 | Discharge status: Home / Self Care | Payer: Medicare Other | Source: Ambulatory Visit | Attending: Family Medicine | Admitting: Family Medicine

## 2023-04-12 DIAGNOSIS — E041 Nontoxic single thyroid nodule: Secondary | ICD-10-CM

## 2023-04-12 LAB — CBC WITH DIFFERENTIAL/PLATELET
Basophils Absolute: 0.1 10*3/uL (ref 0.0–0.1)
Basophils Relative: 0.5 % (ref 0.0–3.0)
Eosinophils Absolute: 0.1 10*3/uL (ref 0.0–0.7)
Eosinophils Relative: 1.3 % (ref 0.0–5.0)
HCT: 41 % (ref 39.0–52.0)
Hemoglobin: 13.8 g/dL (ref 13.0–17.0)
Lymphocytes Relative: 10.5 % — ABNORMAL LOW (ref 12.0–46.0)
Lymphs Abs: 1.1 10*3/uL (ref 0.7–4.0)
MCHC: 33.8 g/dL (ref 30.0–36.0)
MCV: 93.5 fL (ref 78.0–100.0)
Monocytes Absolute: 0.7 10*3/uL (ref 0.1–1.0)
Monocytes Relative: 6.9 % (ref 3.0–12.0)
Neutro Abs: 8.5 10*3/uL — ABNORMAL HIGH (ref 1.4–7.7)
Neutrophils Relative %: 80.8 % — ABNORMAL HIGH (ref 43.0–77.0)
Platelets: 287 10*3/uL (ref 150.0–400.0)
RBC: 4.38 Mil/uL (ref 4.22–5.81)
RDW: 13.4 % (ref 11.5–15.5)
WBC: 10.5 10*3/uL (ref 4.0–10.5)

## 2023-04-12 LAB — COMPREHENSIVE METABOLIC PANEL
ALT: 26 U/L (ref 0–53)
AST: 18 U/L (ref 0–37)
Albumin: 4.2 g/dL (ref 3.5–5.2)
Alkaline Phosphatase: 62 U/L (ref 39–117)
BUN: 14 mg/dL (ref 6–23)
CO2: 31 meq/L (ref 19–32)
Calcium: 9.7 mg/dL (ref 8.4–10.5)
Chloride: 101 meq/L (ref 96–112)
Creatinine, Ser: 1.08 mg/dL (ref 0.40–1.50)
GFR: 63.96 mL/min (ref >60.00)
Glucose, Bld: 102 mg/dL — ABNORMAL HIGH (ref 70–99)
Potassium: 4.6 meq/L (ref 3.5–5.1)
Sodium: 142 meq/L (ref 135–145)
Total Bilirubin: 0.5 mg/dL (ref 0.2–1.2)
Total Protein: 7.2 g/dL (ref 6.0–8.3)

## 2023-04-12 LAB — MICROALBUMIN / CREATININE URINE RATIO
Creatinine,U: 126.5 mg/dL
Microalb Creat Ratio: 0.6 mg/g (ref 0.0–30.0)
Microalb, Ur: <0.7 mg/dL (ref 0.0–1.9)

## 2023-04-12 LAB — HEMOGLOBIN A1C: Hgb A1c MFr Bld: 7.1 % — ABNORMAL HIGH (ref 4.6–6.5)

## 2023-04-12 LAB — IBC + FERRITIN
Ferritin: 88.7 ng/mL (ref 22.0–322.0)
Iron: 36 ug/dL — ABNORMAL LOW (ref 42–165)
Saturation Ratios: 12.6 % — ABNORMAL LOW (ref 20.0–50.0)
TIBC: 285.6 ug/dL (ref 250.0–450.0)
Transferrin: 204 mg/dL — ABNORMAL LOW (ref 212.0–360.0)

## 2023-04-12 NOTE — Telephone Encounter (Signed)
Informed patient's wife that the colonoscopy has been cancelled and to call our office in 3-6 months to reschedule if her husband's health is improving. Patient's wife verbalized understanding.

## 2023-04-12 NOTE — Telephone Encounter (Signed)
Canceled colonoscopy for 12/24 per Dr. Russella Dar.

## 2023-04-12 NOTE — Telephone Encounter (Signed)
-----   Message from [place] T. Russella Dar sent at 04/11/2023  5:47 PM EST ----- Regarding: RE: colonoscopy Thank you for the follow up. Will cancel his 12/24 colonoscopy and one of my partners can reassess him in the office in a few months. ----- Message ----- From: Shelva Majestic, MD Sent: 04/11/2023   5:34 PM EST To: Meryl Dare, MD Subject: colonoscopy                                    Patient with mild cognitive impairment but substantial  profound decline with likely aspiration pneumonia event and metabolic encephalopathy with his illness.  He has basically gone from independent to needing help with pills, preparing food.  -I asked wife to call and cancel for now his upcoming colonoscopy for change in stool caliber with you and reassess in 3 to 6 months although I know that means he will not get to do it with you unfortunately

## 2023-04-13 ENCOUNTER — Telehealth: Payer: Self-pay | Admitting: *Deleted

## 2023-04-13 NOTE — Progress Notes (Signed)
  Care Coordination  Outreach Note  04/13/2023 Name: Walter [name] MRN: [id number] DOB: August 08, 1940   Care Coordination Outreach Attempts: An unsuccessful telephone outreach was attempted today to offer the patient information about available care coordination services.  Follow Up Plan:  Additional outreach attempts will be made to offer the patient care coordination information and services.   Encounter Outcome:  No Answer  Burman Nieves, CCMA Care Coordination Care Guide Direct Dial: [phone]

## 2023-04-13 NOTE — Telephone Encounter (Signed)
Forms completed and faxed back.

## 2023-04-14 ENCOUNTER — Encounter: Payer: Self-pay | Admitting: Family Medicine

## 2023-04-14 NOTE — Progress Notes (Signed)
  Care Coordination   Note   04/14/2023 Name: Walter [name] MRN: [id number] DOB: [birth year]  Walter [name] is a 82 y.o. year old male who sees Hunter, Aldine Contes, MD for primary care. I reached out to [name] by phone today to offer care coordination services.  [name] was given information about Care Coordination services today including:   The Care Coordination services include support from the care team which includes your Nurse Coordinator, Clinical Social Worker, or Pharmacist.  The Care Coordination team is here to help remove barriers to the health concerns and goals most important to you. Care Coordination services are voluntary, and the patient may decline or stop services at any time by request to their care team member.   Care Coordination Consent Status: Patient agreed to services and verbal consent obtained.   Follow up plan:  Telephone appointment with care coordination team member scheduled for:  04/19/2023 and 04/20/2023  Encounter Outcome:  Patient Scheduled from referral   Burman Nieves, [facility] Care Coordination Care Guide Direct Dial: [phone]

## 2023-04-18 ENCOUNTER — Other Ambulatory Visit: Payer: Self-pay

## 2023-04-18 NOTE — Patient Outreach (Signed)
Care Management  Transitions of Care Program Transitions of Care Post-discharge week 3   04/18/2023 Name: Walter [name] MRN: [id number] DOB: 04/21/1941  Subjective: Leodan Gerard is a 82 y.o. year old male who is a primary care patient of Shelva Majestic, MD. The Care Management team   Engaged with wife by phone  to assess and address transitions of care needs.   Consent to Services:  Patient was given information about care management services, agreed to services, and gave verbal consent to participate.   Assessment:   Wife voices pt is doing about the same. She voices that they have had a "a very busy week,lots of appts". Since last call they had completed PCP follow up appt as well as multiple visits in the home from [facility] services. She denies any acute issues or concerns. Continued education and support given. No RN CM needs or concerns at this time.         SDOH Interventions    Flowsheet Row Office Visit from 04/11/2023 in [facility] [place] Most recent reading at 04/11/2023  2:20 PM Patient Outreach from 04/11/2023 in [place] [facility] Most recent reading at 04/11/2023 11:20 AM Telephone from 04/03/2023 in [place] [facility] DEPARTMENT Most recent reading at 04/03/2023 10:31 AM Clinical Support from 05/12/2022 in [facility] [place] Most recent reading at 05/12/2022 10:12 AM  SDOH Interventions      Food Insecurity Interventions -- Intervention Not Indicated Intervention Not Indicated Intervention Not Indicated  Housing Interventions -- Intervention Not Indicated Intervention Not Indicated Intervention Not Indicated  Transportation Interventions -- Intervention Not Indicated Intervention Not Indicated Intervention Not Indicated  Utilities Interventions -- Intervention Not Indicated Intervention Not Indicated Intervention Not Indicated  Depression Interventions/Treatment  Counseling -- -- --   Financial Strain Interventions -- -- -- Intervention Not Indicated  Physical Activity Interventions -- -- -- Intervention Not Indicated  Stress Interventions -- -- -- Intervention Not Indicated  Social Connections Interventions -- -- -- Intervention Not Indicated        Goals Addressed             This Visit's Progress    TOC Care Plan       Current Barriers:  Knowledge Deficits related to plan of care for management of DMII and dementia   RNCM Clinical Goal(s):  Patient will work with the Care Management team over the next 30 days to address Transition of Care Barriers: mgmt of chronic conditons take all medications exactly as prescribed and will call provider for medication related questions as evidenced by EMR demonstrate understanding of rationale for each prescribed medication as evidenced by EMR attend all scheduled medical appointments: PCP and specialist as evidenced by EMR  through collaboration with RN Care manager, provider, and care team.   Interventions: Evaluation of current treatment plan related to  self management and patient's adherence to plan as established by provider   Diabetes Interventions:  (Status:  Goal on track:  Yes.) Short Term Goal Assessed patient's understanding of A1c goal: <7% Assessed nutritional status-per wife pt continues to have a good appetite,-eating well and still abel to go to the bathroom on his own -Assessed for any abnormal cbgs readings and none reported Lab Results  Component Value Date   HGBA1C 7.4 (H) 12/27/2022     Dementia:  (Status:  Goal on track:  Yes.)  Short Term Goal Evaluation of current treatment plan related to misuse  of: Alzheimer's dementia Reviewed medications including any OTC meds being taken, Emotional Support Provided to patient/caregiver, Sleep assessment completed, Encouraged patient/caregiver counseling/support: wife is dealing with the drastic change/decline in pt's condition -limited support system,  Consideration of in-home help encouraged , Discussed importance of discussing diagnosis with provider, and Advised to contact provider for new or worsening symptoms -Assessed for caregiver burnout/fatigue and coping- wife voices she has not had a chance to call [facility] to see if [facility] eligible for any services/benefits, she voices that her daughters and sister are a very good support sytem for her and her "outlet"to talk and shares her feelings, she spoke with [place] SW yesterday abut possible in home support options, she has an appt with [facility] SW from PCP referral to speak with SW on 04/24/23  -Assessed HH services- pt getting therapy about 2x/wk-therapist just eft he home a few mins ago -Reinforced safety in the home due to pt's mental status -Continued education/support of dementia- pt experiencing sundowner's early due to time change and "getting dark around 5pm" which makes it harder for wife to get everything done that's needed throughout the day  Patient Goals/Self-Care Activities: Participate in Transition of Care Program/Attend [facility] scheduled calls Notify RN Care Manager of TOC call rescheduling needs Take all medications as prescribed Attend all scheduled provider appointments Call provider office for new concerns or questions  Maintain safety in the home-report no falls Wife will continue to use support system to help her with coping and caring for pt  Follow Up Plan:  Telephone follow up appointment with care management team member scheduled for:  04/25/23-11 am The patient has been provided with contact information for the care management team and has been advised to call with any health related questions or concerns.          Plan: The patient has been provided with contact information for the care management team and has been advised to call with any health related questions or concerns.  Follow up appt scheduled for 04/25/23-11 am.  Antionette Fairy, RN,BSN,CCM RN Care Manager  Transitions of Care  [place]/Population Health  Direct Phone: [phone] Toll Free: [phone] Fax: [phone]

## 2023-04-18 NOTE — Patient Instructions (Signed)
Visit Information  Thank you for taking time to visit with me today. Please don't hesitate to contact me if I can be of assistance to you before our next scheduled telephone appointment.  Our next appointment is by telephone on 04/25/23 at 11am.  Following is a copy of your care plan:   Goals Addressed             This Visit's Progress    TOC Care Plan       Current Barriers:  Knowledge Deficits related to plan of care for management of DMII and dementia   RNCM Clinical Goal(s):  Patient will work with the Care Management team over the next 30 days to address Transition of Care Barriers: mgmt of chronic conditons take all medications exactly as prescribed and will call provider for medication related questions as evidenced by EMR demonstrate understanding of rationale for each prescribed medication as evidenced by EMR attend all scheduled medical appointments: PCP and specialist as evidenced by EMR  through collaboration with RN Care manager, provider, and care team.   Interventions: Evaluation of current treatment plan related to  self management and patient's adherence to plan as established by provider   Diabetes Interventions:  (Status:  Goal on track:  Yes.) Short Term Goal Assessed patient's understanding of A1c goal: <7% Assessed nutritional status-per wife pt continues to have a good appetite,-eating well and still abel to go to the bathroom on his own -Assessed for any abnormal cbgs readings and none reported Lab Results  Component Value Date   HGBA1C 7.4 (H) 12/27/2022     Dementia:  (Status:  Goal on track:  Yes.)  Short Term Goal Evaluation of current treatment plan related to misuse of: Alzheimer's dementia Reviewed medications including any OTC meds being taken, Emotional Support Provided to patient/caregiver, Sleep assessment completed, Encouraged patient/caregiver counseling/support: wife is dealing with the drastic change/decline in pt's condition -limited  support system, Consideration of in-home help encouraged , Discussed importance of discussing diagnosis with provider, and Advised to contact provider for new or worsening symptoms -Assessed for caregiver burnout/fatigue and coping- wife voices she has not had a chance to call [facility] to see if pt eligible for any services/benefits, she voices that her daughters and sister are a very good support sytem for her and her "outlet"to talk and shares her feelings, she spoke with [place] SW yesterday abut possible in home support options, she has an appt with [facility] SW from PCP referral to speak with SW on 04/24/23  -Assessed HH services- pt getting therapy about 2x/wk-therapist just eft he home a few mins ago -Reinforced safety in the home due to pt's mental status -Continued education/support of dementia- pt experiencing sundowner's early due to time change and "getting dark around 5pm" which makes it harder for wife to get everything done that's needed throughout the day  Patient Goals/Self-Care Activities: Participate in Transition of Care Program/Attend [facility] scheduled calls Notify RN Care Manager of TOC call rescheduling needs Take all medications as prescribed Attend all scheduled provider appointments Call provider office for new concerns or questions  Maintain safety in the home-report no falls Wife will continue to use support system to help her with coping and caring for pt  Follow Up Plan:  Telephone follow up appointment with care management team member scheduled for:  04/25/23-11 am The patient has been provided with contact information for the care management team and has been advised to call with any health related questions or concerns.  Patient verbalizes understanding of instructions and care plan provided today and agrees to view in MyChart. Active MyChart status and patient understanding of how to access instructions and care plan via MyChart confirmed with patient.     The  patient has been provided with contact information for the care management team and has been advised to call with any health related questions or concerns.   Please call the care guide team at [phone] if you need to cancel or reschedule your appointment.   Please call the [place] National Suicide Prevention Lifeline: 859-131-3109 or TTY: 574-565-5240 TTY 743-047-3502) to talk to a trained counselor call 1-800-273-TALK (toll free, 24 hour hotline) if you are experiencing a Mental Health or Behavioral Health Crisis or need someone to talk to.  Antionette Fairy, RN,BSN,CCM RN Care Manager Transitions of Care  [place]/Population Health  Direct Phone: [phone] Toll Free: [phone] Fax: [phone]

## 2023-04-19 ENCOUNTER — Emergency Department (HOSPITAL_COMMUNITY): Payer: Medicare Other

## 2023-04-19 ENCOUNTER — Inpatient Hospital Stay (HOSPITAL_COMMUNITY)
Admission: EM | Admit: 2023-04-19 | Discharge: 2023-05-03 | DRG: 175 | Disposition: A | Discharge status: Skilled Nursing Facility | Payer: Medicare Other | Attending: Family Medicine | Admitting: Family Medicine

## 2023-04-19 ENCOUNTER — Telehealth: Payer: Self-pay | Admitting: *Deleted

## 2023-04-19 ENCOUNTER — Encounter (HOSPITAL_COMMUNITY): Payer: Self-pay | Admitting: Emergency Medicine

## 2023-04-19 DIAGNOSIS — F4310 Post-traumatic stress disorder, unspecified: Secondary | ICD-10-CM | POA: Diagnosis present

## 2023-04-19 DIAGNOSIS — Z7901 Long term (current) use of anticoagulants: Secondary | ICD-10-CM

## 2023-04-19 DIAGNOSIS — Z85038 Personal history of other malignant neoplasm of large intestine: Secondary | ICD-10-CM

## 2023-04-19 DIAGNOSIS — R4189 Other symptoms and signs involving cognitive functions and awareness: Secondary | ICD-10-CM | POA: Diagnosis present

## 2023-04-19 DIAGNOSIS — G4733 Obstructive sleep apnea (adult) (pediatric): Secondary | ICD-10-CM | POA: Diagnosis present

## 2023-04-19 DIAGNOSIS — G9341 Metabolic encephalopathy: Secondary | ICD-10-CM | POA: Diagnosis present

## 2023-04-19 DIAGNOSIS — F03918 Unspecified dementia, unspecified severity, with other behavioral disturbance: Secondary | ICD-10-CM | POA: Diagnosis not present

## 2023-04-19 DIAGNOSIS — E119 Type 2 diabetes mellitus without complications: Secondary | ICD-10-CM | POA: Diagnosis not present

## 2023-04-19 DIAGNOSIS — N400 Enlarged prostate without lower urinary tract symptoms: Secondary | ICD-10-CM | POA: Diagnosis present

## 2023-04-19 DIAGNOSIS — Z8701 Personal history of pneumonia (recurrent): Secondary | ICD-10-CM

## 2023-04-19 DIAGNOSIS — Z1152 Encounter for screening for COVID-19: Secondary | ICD-10-CM

## 2023-04-19 DIAGNOSIS — Z66 Do not resuscitate: Secondary | ICD-10-CM | POA: Diagnosis present

## 2023-04-19 DIAGNOSIS — Y92230 Patient room in hospital as the place of occurrence of the external cause: Secondary | ICD-10-CM | POA: Diagnosis present

## 2023-04-19 DIAGNOSIS — I2693 Single subsegmental pulmonary embolism without acute cor pulmonale: Secondary | ICD-10-CM | POA: Diagnosis not present

## 2023-04-19 DIAGNOSIS — R29810 Facial weakness: Secondary | ICD-10-CM | POA: Diagnosis present

## 2023-04-19 DIAGNOSIS — Z8719 Personal history of other diseases of the digestive system: Secondary | ICD-10-CM

## 2023-04-19 DIAGNOSIS — Z860101 Personal history of adenomatous and serrated colon polyps: Secondary | ICD-10-CM

## 2023-04-19 DIAGNOSIS — Z7984 Long term (current) use of oral hypoglycemic drugs: Secondary | ICD-10-CM

## 2023-04-19 DIAGNOSIS — Z87891 Personal history of nicotine dependence: Secondary | ICD-10-CM

## 2023-04-19 DIAGNOSIS — Z89421 Acquired absence of other right toe(s): Secondary | ICD-10-CM

## 2023-04-19 DIAGNOSIS — R911 Solitary pulmonary nodule: Secondary | ICD-10-CM | POA: Diagnosis present

## 2023-04-19 DIAGNOSIS — Z85828 Personal history of other malignant neoplasm of skin: Secondary | ICD-10-CM

## 2023-04-19 DIAGNOSIS — W1839XA Other fall on same level, initial encounter: Secondary | ICD-10-CM | POA: Diagnosis present

## 2023-04-19 DIAGNOSIS — Z9049 Acquired absence of other specified parts of digestive tract: Secondary | ICD-10-CM

## 2023-04-19 DIAGNOSIS — G9340 Encephalopathy, unspecified: Secondary | ICD-10-CM | POA: Diagnosis present

## 2023-04-19 DIAGNOSIS — Z882 Allergy status to sulfonamides status: Secondary | ICD-10-CM

## 2023-04-19 DIAGNOSIS — I2699 Other pulmonary embolism without acute cor pulmonale: Secondary | ICD-10-CM | POA: Diagnosis not present

## 2023-04-19 DIAGNOSIS — I10 Essential (primary) hypertension: Secondary | ICD-10-CM | POA: Diagnosis present

## 2023-04-19 DIAGNOSIS — R4182 Altered mental status, unspecified: Principal | ICD-10-CM

## 2023-04-19 DIAGNOSIS — J189 Pneumonia, unspecified organism: Secondary | ICD-10-CM | POA: Diagnosis present

## 2023-04-19 DIAGNOSIS — S0181XA Laceration without foreign body of other part of head, initial encounter: Secondary | ICD-10-CM | POA: Diagnosis not present

## 2023-04-19 DIAGNOSIS — E785 Hyperlipidemia, unspecified: Secondary | ICD-10-CM | POA: Diagnosis present

## 2023-04-19 DIAGNOSIS — F0393 Unspecified dementia, unspecified severity, with mood disturbance: Secondary | ICD-10-CM | POA: Diagnosis present

## 2023-04-19 DIAGNOSIS — I959 Hypotension, unspecified: Secondary | ICD-10-CM

## 2023-04-19 DIAGNOSIS — S51811A Laceration without foreign body of right forearm, initial encounter: Secondary | ICD-10-CM | POA: Diagnosis not present

## 2023-04-19 DIAGNOSIS — E1169 Type 2 diabetes mellitus with other specified complication: Secondary | ICD-10-CM | POA: Diagnosis present

## 2023-04-19 DIAGNOSIS — K862 Cyst of pancreas: Secondary | ICD-10-CM | POA: Diagnosis present

## 2023-04-19 DIAGNOSIS — F0394 Unspecified dementia, unspecified severity, with anxiety: Secondary | ICD-10-CM | POA: Diagnosis present

## 2023-04-19 DIAGNOSIS — F32A Depression, unspecified: Secondary | ICD-10-CM | POA: Diagnosis present

## 2023-04-19 DIAGNOSIS — F419 Anxiety disorder, unspecified: Secondary | ICD-10-CM | POA: Insufficient documentation

## 2023-04-19 DIAGNOSIS — R262 Difficulty in walking, not elsewhere classified: Secondary | ICD-10-CM | POA: Diagnosis present

## 2023-04-19 DIAGNOSIS — Z79899 Other long term (current) drug therapy: Secondary | ICD-10-CM

## 2023-04-19 DIAGNOSIS — Z833 Family history of diabetes mellitus: Secondary | ICD-10-CM

## 2023-04-19 HISTORY — DX: Other pulmonary embolism without acute cor pulmonale: I26.99

## 2023-04-19 LAB — COMPREHENSIVE METABOLIC PANEL
ALT: 35 U/L (ref 0–44)
AST: 23 U/L (ref 15–41)
Albumin: 3.3 g/dL — ABNORMAL LOW (ref 3.5–5.0)
Alkaline Phosphatase: 61 U/L (ref 38–126)
Anion gap: 13 (ref 5–15)
BUN: 13 mg/dL (ref 8–23)
CO2: 26 mmol/L (ref 22–32)
Calcium: 9.2 mg/dL (ref 8.9–10.3)
Chloride: 98 mmol/L (ref 98–111)
Creatinine, Ser: 0.93 mg/dL (ref 0.61–1.24)
GFR, Estimated: >60 mL/min (ref >60)
Glucose, Bld: 206 mg/dL — ABNORMAL HIGH (ref 70–99)
Potassium: 4.7 mmol/L (ref 3.5–5.1)
Sodium: 137 mmol/L (ref 135–145)
Total Bilirubin: 0.8 mg/dL (ref <1.2)
Total Protein: 6.8 g/dL (ref 6.5–8.1)

## 2023-04-19 LAB — URINALYSIS, ROUTINE W REFLEX MICROSCOPIC
Bilirubin Urine: NEGATIVE
Glucose, UA: NEGATIVE mg/dL
Hgb urine dipstick: NEGATIVE
Ketones, ur: 20 mg/dL — AB
Leukocytes,Ua: NEGATIVE
Nitrite: NEGATIVE
Protein, ur: NEGATIVE mg/dL
Specific Gravity, Urine: 1.019 (ref 1.005–1.030)
pH: 7 (ref 5.0–8.0)

## 2023-04-19 LAB — RESP PANEL BY RT-PCR (RSV, FLU A&B, COVID)  RVPGX2
Influenza A by PCR: NEGATIVE
Influenza B by PCR: NEGATIVE
Resp Syncytial Virus by PCR: NEGATIVE
SARS Coronavirus 2 by RT PCR: NEGATIVE

## 2023-04-19 LAB — CBC WITH DIFFERENTIAL/PLATELET
Abs Immature Granulocytes: 0.08 10*3/uL — ABNORMAL HIGH (ref 0.00–0.07)
Basophils Absolute: 0 10*3/uL (ref 0.0–0.1)
Basophils Relative: 0 %
Eosinophils Absolute: 0 10*3/uL (ref 0.0–0.5)
Eosinophils Relative: 0 %
HCT: 38.6 % — ABNORMAL LOW (ref 39.0–52.0)
Hemoglobin: 12.9 g/dL — ABNORMAL LOW (ref 13.0–17.0)
Immature Granulocytes: 1 %
Lymphocytes Relative: 3 %
Lymphs Abs: 0.3 10*3/uL — ABNORMAL LOW (ref 0.7–4.0)
MCH: 30.5 pg (ref 26.0–34.0)
MCHC: 33.4 g/dL (ref 30.0–36.0)
MCV: 91.3 fL (ref 80.0–100.0)
Monocytes Absolute: 0.4 10*3/uL (ref 0.1–1.0)
Monocytes Relative: 4 %
Neutro Abs: 9.9 10*3/uL — ABNORMAL HIGH (ref 1.7–7.7)
Neutrophils Relative %: 92 %
Platelets: 268 10*3/uL (ref 150–400)
RBC: 4.23 MIL/uL (ref 4.22–5.81)
RDW: 12.6 % (ref 11.5–15.5)
WBC: 10.7 10*3/uL — ABNORMAL HIGH (ref 4.0–10.5)
nRBC: 0 % (ref 0.0–0.2)

## 2023-04-19 LAB — PROCALCITONIN: Procalcitonin: <0.1 ng/mL

## 2023-04-19 LAB — CBG MONITORING, ED: Glucose-Capillary: 189 mg/dL — ABNORMAL HIGH (ref 70–99)

## 2023-04-19 LAB — LACTIC ACID, PLASMA: Lactic Acid, Venous: 1.5 mmol/L (ref 0.5–1.9)

## 2023-04-19 LAB — TROPONIN I (HIGH SENSITIVITY)
Troponin I (High Sensitivity): 6 ng/L (ref <18)
Troponin I (High Sensitivity): 7 ng/L (ref <18)

## 2023-04-19 LAB — BRAIN NATRIURETIC PEPTIDE: B Natriuretic Peptide: 82.9 pg/mL (ref 0.0–100.0)

## 2023-04-19 MED ORDER — HEPARIN (PORCINE) 25000 UT/250ML-% IV SOLN
1650.0000 [IU]/h | INTRAVENOUS | Status: DC
Start: 2023-04-19 — End: 2023-04-21
  Administered 2023-04-19: 1300 [IU]/h via INTRAVENOUS
  Administered 2023-04-20: 1500 [IU]/h via INTRAVENOUS
  Administered 2023-04-20: 1650 [IU]/h via INTRAVENOUS
  Filled 2023-04-19 (×3): qty 250

## 2023-04-19 MED ORDER — KETOROLAC TROMETHAMINE 30 MG/ML IJ SOLN
30.0000 mg | Freq: Once | INTRAMUSCULAR | Status: AC
  Administered 2023-04-19: 30 mg via INTRAVENOUS
  Filled 2023-04-19: qty 1

## 2023-04-19 MED ORDER — IRBESARTAN 150 MG PO TABS
300.0000 mg | ORAL_TABLET | Freq: Every day | ORAL | Status: DC
  Administered 2023-04-20 – 2023-05-01 (×12): 300 mg via ORAL
  Filled 2023-04-19 (×12): qty 2

## 2023-04-19 MED ORDER — SODIUM CHLORIDE 0.9 % IV SOLN
500.0000 mg | INTRAVENOUS | Status: DC
  Administered 2023-04-19 – 2023-04-20 (×2): 500 mg via INTRAVENOUS
  Filled 2023-04-19 (×2): qty 5

## 2023-04-19 MED ORDER — HEPARIN BOLUS VIA INFUSION
5000.0000 [IU] | Freq: Once | INTRAVENOUS | Status: AC
  Administered 2023-04-19: 5000 [IU] via INTRAVENOUS
  Filled 2023-04-19: qty 5000

## 2023-04-19 MED ORDER — ACETAMINOPHEN 325 MG PO TABS: 650.0000 mg | ORAL_TABLET | Freq: Four times a day (QID) | ORAL | Status: DC | PRN

## 2023-04-19 MED ORDER — VANCOMYCIN HCL IN DEXTROSE 1-5 GM/200ML-% IV SOLN
1000.0000 mg | Freq: Once | INTRAVENOUS | Status: AC
  Administered 2023-04-19: 1000 mg via INTRAVENOUS
  Filled 2023-04-19: qty 200

## 2023-04-19 MED ORDER — SODIUM CHLORIDE 0.9 % IV SOLN
2.0000 g | INTRAVENOUS | Status: AC
  Administered 2023-04-19 – 2023-04-24 (×5): 2 g via INTRAVENOUS
  Filled 2023-04-19 (×5): qty 20

## 2023-04-19 MED ORDER — VANCOMYCIN HCL 750 MG/150ML IV SOLN
750.0000 mg | Freq: Once | INTRAVENOUS | Status: AC
  Administered 2023-04-19: 750 mg via INTRAVENOUS
  Filled 2023-04-19: qty 150

## 2023-04-19 MED ORDER — SODIUM CHLORIDE 0.9% FLUSH
3.0000 mL | Freq: Two times a day (BID) | INTRAVENOUS | Status: DC
  Administered 2023-04-19 – 2023-04-24 (×11): 3 mL via INTRAVENOUS

## 2023-04-19 MED ORDER — SODIUM CHLORIDE 0.9 % IV SOLN: 500.0000 mg | INTRAVENOUS | Status: DC

## 2023-04-19 MED ORDER — AMLODIPINE BESYLATE 5 MG PO TABS
5.0000 mg | ORAL_TABLET | Freq: Every day | ORAL | Status: DC
  Administered 2023-04-19 – 2023-05-01 (×13): 5 mg via ORAL
  Filled 2023-04-19 (×13): qty 1

## 2023-04-19 MED ORDER — INSULIN ASPART 100 UNIT/ML IJ SOLN
0.0000 [IU] | Freq: Three times a day (TID) | INTRAMUSCULAR | Status: DC
  Administered 2023-04-19 – 2023-04-20 (×3): 2 [IU] via SUBCUTANEOUS
  Administered 2023-04-20: 5 [IU] via SUBCUTANEOUS
  Administered 2023-04-21: 2 [IU] via SUBCUTANEOUS
  Administered 2023-04-21: 3 [IU] via SUBCUTANEOUS
  Administered 2023-04-21: 2 [IU] via SUBCUTANEOUS
  Administered 2023-04-22: 3 [IU] via SUBCUTANEOUS
  Administered 2023-04-22 – 2023-04-23 (×3): 2 [IU] via SUBCUTANEOUS
  Administered 2023-04-23: 3 [IU] via SUBCUTANEOUS
  Administered 2023-04-23: 5 [IU] via SUBCUTANEOUS
  Administered 2023-04-24: 7 [IU] via SUBCUTANEOUS
  Administered 2023-04-24: 3 [IU] via SUBCUTANEOUS
  Administered 2023-04-24: 7 [IU] via SUBCUTANEOUS

## 2023-04-19 MED ORDER — ACETAMINOPHEN 650 MG RE SUPP: 650.0000 mg | Freq: Four times a day (QID) | RECTAL | Status: DC | PRN

## 2023-04-19 MED ORDER — SODIUM CHLORIDE 0.9 % IV BOLUS
1000.0000 mL | Freq: Once | INTRAVENOUS | Status: AC
  Administered 2023-04-19: 1000 mL via INTRAVENOUS

## 2023-04-19 MED ORDER — POLYETHYLENE GLYCOL 3350 17 G PO PACK
17.0000 g | PACK | Freq: Every day | ORAL | Status: DC | PRN
  Administered 2023-04-25: 17 g via ORAL
  Filled 2023-04-19: qty 1

## 2023-04-19 MED ORDER — ROSUVASTATIN CALCIUM 20 MG PO TABS
20.0000 mg | ORAL_TABLET | Freq: Every day | ORAL | Status: DC
  Administered 2023-04-19 – 2023-05-03 (×15): 20 mg via ORAL
  Filled 2023-04-19 (×15): qty 1

## 2023-04-19 MED ORDER — PANTOPRAZOLE SODIUM 40 MG PO TBEC
40.0000 mg | DELAYED_RELEASE_TABLET | Freq: Every day | ORAL | Status: DC
  Administered 2023-04-19 – 2023-05-03 (×15): 40 mg via ORAL
  Filled 2023-04-19 (×15): qty 1

## 2023-04-19 MED ORDER — DONEPEZIL HCL 10 MG PO TABS
10.0000 mg | ORAL_TABLET | Freq: Every day | ORAL | Status: DC
  Administered 2023-04-19 – 2023-05-02 (×14): 10 mg via ORAL
  Filled 2023-04-19 (×14): qty 1

## 2023-04-19 MED ORDER — PIPERACILLIN-TAZOBACTAM 3.375 G IVPB 30 MIN
3.3750 g | Freq: Once | INTRAVENOUS | Status: AC
  Administered 2023-04-19: 3.375 g via INTRAVENOUS
  Filled 2023-04-19: qty 50

## 2023-04-19 MED ORDER — IOHEXOL 350 MG/ML SOLN
75.0000 mL | Freq: Once | INTRAVENOUS | Status: AC | PRN
  Administered 2023-04-19: 75 mL via INTRAVENOUS

## 2023-04-19 NOTE — ED Triage Notes (Signed)
Pt here from home with c/o altered mental status and difficultly walking along with being non verbal pt was on 11/29 for same

## 2023-04-19 NOTE — Progress Notes (Signed)
Pt is only alert & oriented to self. No family at bedside. Unable to complete admission documentation.   Bari Edward, RN

## 2023-04-19 NOTE — ED Provider Notes (Signed)
[place] EMERGENCY DEPARTMENT AT [facility] Provider Note   CSN: [id number] Arrival date & time: 04/19/23  1104     History  Chief Complaint  Patient presents with   Altered Mental Status    Walter [name] is a 82 y.o. male with a history of dementia presenting with concern for confusion.  Per review of external records as well as a phone call conversation with the patient's wife and his provider, patient has had excessive weakness for several days.  This morning he was more confused, difficulty using the bathroom, uncooperative.  His wife called EMS to bring him here to the hospital.  Patient is nonverbal on arrival, will not provide any information.  Level 5 caveat for dementia.  Hospitalized Nov/Dec for AMS, aspiration PNA, treated with antibiotics  His wife Xane Maruska by phone tells me - since leaving th ehospital 04/02/23 pt spends all day sitting in his chair, she needs to prompt him to do things, and needs constant attention.  Last night and today he seemed much more confused than typical.  He was shivering at breakfast. He didn't want to eat, he had a runny nose.  He was not complaining of a headache.  The patient's wife and daughter also tell me in person that the patient had a very similar presentation was admitted to the hospital 1 month ago.  Prior to that, he did have cognitive impairment, but was fairly independently functional, driving himself around town, able to have a conversation.  Even after his last hospitalization 1 month ago, the patient was able to converse fairly normally until today, but is now repeating only "yes and no".  HPI     Home Medications Prior to Admission medications   Medication Sig Start Date End Date Taking? Authorizing Provider  amLODipine (NORVASC) 5 MG tablet TAKE 1 TABLET BY MOUTH EVERY DAY 01/10/23  Yes Shelva Majestic, MD  [employer] STOMACH RELIEF 262 MG chewable tablet Chew 2 tablets by mouth 4 (four) times daily. 02/07/22  Yes  [provider]  donepezil (ARICEPT) 10 MG tablet TAKE 1 TABLET BY MOUTH EVERYDAY AT BEDTIME Patient taking differently: Take 10 mg by mouth at bedtime. 12/14/22  Yes Van Clines, MD  ferrous sulfate 325 (65 FE) MG tablet Take 1 tablet (325 mg total) by mouth 2 (two) times daily with a meal. 01/29/22  Yes Narda Bonds, MD  glipiZIDE (GLUCOTROL) 10 MG tablet TAKE 1 TABLET (10 MG TOTAL) BY MOUTH TWICE A DAY BEFORE A MEAL 05/05/22  Yes Shelva Majestic, MD  Glucagon (GVOKE HYPOPEN 2-PACK) 0.5 MG/0.1ML SOAJ Inject 0.5 mg into the skin daily as needed (for low blood sugar). 02/04/22  Yes Shelva Majestic, MD  metFORMIN (GLUCOPHAGE) 1000 MG tablet TAKE 1 TABLET BY MOUTH TWICE A DAY WITH A MEAL 01/10/23  Yes Shelva Majestic, MD  rosuvastatin (CRESTOR) 20 MG tablet TAKE 1 TABLET BY MOUTH ONE TIME PER WEEK 06/21/22  Yes Shelva Majestic, MD  sitaGLIPtin (JANUVIA) 100 MG tablet Take 1 tablet (100 mg total) by mouth daily. 04/21/22  Yes Shelva Majestic, MD  telmisartan (MICARDIS) 80 MG tablet Take 1 tablet (80 mg total) by mouth daily. 10/06/22  Yes Shelva Majestic, MD  [facility] ULTRA test strip USE ONCE DAILY AS INSTRUCTED 09/01/22   Shelva Majestic, MD  QUEtiapine (SEROQUEL) 25 MG tablet Take 1 tablet (25 mg total) by mouth at bedtime. Patient not taking: Reported on 04/19/2023 04/02/23  Almon Hercules, MD      Allergies    Bactrim [sulfamethoxazole-trimethoprim]    Review of Systems   Review of Systems  Physical Exam Updated Vital Signs BP (!) 142/130 (BP Location: Right Arm)   Pulse 85   Temp (!) 100.7 F (38.2 C) (Axillary)   Resp 16   SpO2 94%  Physical Exam Constitutional:      General: He is not in acute distress. HENT:     Head: Normocephalic and atraumatic.  Eyes:     Conjunctiva/sclera: Conjunctivae normal.     Pupils: Pupils are equal, round, and reactive to light.  Cardiovascular:     Rate and Rhythm: Normal rate and regular rhythm.  Pulmonary:     Effort:  Pulmonary effort is normal. No respiratory distress.  Abdominal:     General: There is no distension.     Tenderness: There is no abdominal tenderness.  Skin:    General: Skin is warm and dry.  Neurological:     Mental Status: He is alert.     Comments: Patient is only able to yes and no to simple questions He is not following specific commands but is able to move all extremities on his own     ED Results / Procedures / Treatments   Labs (all labs ordered are listed, but only abnormal results are displayed) Labs Reviewed  CULTURE, BLOOD (ROUTINE X 2)  CULTURE, BLOOD (ROUTINE X 2)  RESP PANEL BY RT-PCR (RSV, FLU A&B, COVID)  [id number]  COMPREHENSIVE METABOLIC PANEL  CBC WITH DIFFERENTIAL/PLATELET  URINALYSIS, ROUTINE W REFLEX MICROSCOPIC  LACTIC ACID, PLASMA  LACTIC ACID, PLASMA    EKG None  Radiology No results found.  Procedures Procedures    Medications Ordered in ED Medications  sodium chloride 0.9 % bolus 1,000 mL (has no administration in time range)    ED Course/ Medical Decision Making/ A&P Clinical Course as of 04/19/23 1655  Wed Apr 19, 2023  1306 Family at bedside [MT]  1401 Patient is febrile but does not meet SIRS criteria, lactate wnl - awaiting viral panel and infectious workup prior to initiation of antibiotics [MT]  1507 No clear infectious source noted - IV vanco and zosyn ordered [MT]  1508 Pt more awake now but still nonconversant - signed out to Dr Ricke Hey EDP pending CT imaging and anticipated admission [MT]    Clinical Course User Index [MT] Cadie Sorci, Kermit Balo, MD                                 Medical Decision Making Amount and/or Complexity of Data Reviewed Labs: ordered. Radiology: ordered.  Risk Prescription drug management. Decision regarding hospitalization.   This patient presents to the ED with concern for confusion, altered mental status, fever. This involves an extensive number of treatment options, and is a complaint that  carries with it a high risk of complications and morbidity.  The differential diagnosis includes viral infection versus bacterial infection versus metabolic derangement or metabolic encephalopathy versus intracranial lesion versus other  Co-morbidities that complicate the patient evaluation: History of dementia  Additional history obtained from patient's wife and daughter  External records from outside source obtained and reviewed including hospitalization discharge summary from December 1, where the patient was managed for severe sepsis due to aspiration pneumonia, with blood cultures no growth to date at the time.  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  LP was not pursued after admission since suspicion of meningitis encephalitis is felt to be less likely, according to hospitalist note.  I ordered and personally interpreted labs.  The pertinent results include: White blood cell count 10.7.  Lactate within normal limits.  Urinalysis with no evidence of infection, negative nitrites and leukocytes.  CMP with some mild hyperglycemia glucose 206, no other emergent findings.  Blood cultures are drawn and pending.  COVID is pending.  I ordered imaging studies including x-ray of the chest for pneumonia screening, CT imaging of the head, chest abdomen pelvis with contrast for sepsis evaluation, which were pending at the time of admission  The patient was maintained on a cardiac monitor.  I personally viewed and interpreted the cardiac monitored which showed an underlying rhythm of: Sinus rhythm  I ordered medication including IV fluids and Toradol  I have reviewed the patients home medicines and have made adjustments as needed  Test Considered: If no clear source of fever or infection, patient may benefit from diagnostic LP as an inpatient.  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  My suspicion for acute bacterial meningitis is low at this time with no complaint of headache, no photophobia or nuccal rigidity - per presentation and recent history provided by patient's wife.           Final Clinical Impression(s) / ED Diagnoses Final diagnoses:  None    Rx / DC Orders ED Discharge Orders     None         Edilberto Roosevelt, Kermit Balo, MD 04/19/23 [phone]

## 2023-04-19 NOTE — Progress Notes (Signed)
ED Pharmacy Antibiotic Sign Off An antibiotic consult was received from an ED provider for Zosyn (piperacillin-tazobactam) and Vancomycin per pharmacy dosing for Sepsis. A chart review was completed to assess appropriateness.   The following one time order(s) were placed:  Zosyn 3.375g IV x1 over 30 min Vancomycin 1750mg  (given as 1000mg  IV, followed by 750mg  IV)   Further antibiotic and/or antibiotic pharmacy consults should be ordered by the admitting provider if indicated.   Thank you for allowing pharmacy to be a part of this patient's care.   Wilburn Cornelia, PharmD, BCPS Clinical Pharmacist 04/19/2023 3:37 PM   Please refer to [facility] for pharmacy phone number

## 2023-04-19 NOTE — ED Notes (Signed)
ED TO INPATIENT HANDOFF REPORT  ED Nurse Name and Phone #: Angelica Chessman, [phone]  S Name/Age/Gender Walter [name] 82 y.o. male Room/Bed: 027C/027C  Code Status   Code Status: Limited: Do not attempt resuscitation (DNR) -DNR-LIMITED -Do Not Intubate/DNI   Home/SNF/Other Home Patient oriented to: self and place Is this baseline? No   Triage Complete: Triage complete  Chief Complaint Acute encephalopathy [G93.40]  Triage Note Pt here from home with c/o altered mental status and difficultly walking along with being non verbal pt was on 11/29 for same    Allergies Allergies  Allergen Reactions   Bactrim [Sulfamethoxazole-Trimethoprim] Nausea And Vomiting    Pt has chills and fevers also.    Level of Care/Admitting Diagnosis ED Disposition     ED Disposition  Admit   Condition  --   Comment  Hospital Area: [facility] [[id number]]  Level of Care: Progressive [102]  Admit to Progressive based on following criteria: CARDIOVASCULAR & THORACIC of moderate stability with acute coronary syndrome symptoms/low risk myocardial infarction/hypertensive urgency/arrhythmias/heart failure potentially compromising stability and stable post cardiovascular intervention patients.  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 All started after a hemorrhoid surgery and later with urinary retention. Patient at one point was septic from urinary issues.     Diabetes mellitus type II, controlled 04/23/2007    Metformin 1g BID, amaryl 4mg --> 8mg , had to add [place] back  Never took victoza.   Januvia-lethargic and dizzy in past. 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 Follows with Dr. 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   Pulmonary nodule 02/04/2022   Sepsis secondary to UTI 03/29/2015   Severe sepsis (HCC) 03/31/2023   Past Surgical History:  Procedure Laterality Date   AMPUTATION TOE Right 09/14/2016   Procedure: RIGHT 2ND TOE/RAY AMPUTATION;  Surgeon: Tarry Kos, MD;  Location: [facility];  Service: Orthopedics;  Laterality: Right;   APPENDECTOMY  05/03/1947   BIOPSY  01/28/2022   Procedure: BIOPSY;  Surgeon: Sherrilyn Rist, MD;  Location: [facility] ENDOSCOPY;  Service: Gastroenterology;;   COLECTOMY  05/02/1990   CYSTOSCOPY N/A 06/21/2015   Procedure: CYSTOSCOPY, CLOT  EVACUATION, AND CAUTERIZATION OF PROSTATE FOSSA;  Surgeon: Jethro Bolus, MD;  Location: [facility]S;  Service: Urology;  Laterality: N/A;   ESOPHAGOGASTRODUODENOSCOPY (EGD) WITH PROPOFOL N/A 01/26/2022   Procedure: ESOPHAGOGASTRODUODENOSCOPY (EGD) WITH PROPOFOL;  Surgeon: Sherrilyn Rist, MD;  Location: [facility] ENDOSCOPY;  Service: Gastroenterology;  Laterality: N/A;   ESOPHAGOGASTRODUODENOSCOPY (EGD) WITH PROPOFOL N/A 01/28/2022   Procedure: ESOPHAGOGASTRODUODENOSCOPY (EGD) WITH PROPOFOL;  Surgeon: Sherrilyn Rist, MD;  Location: [facility] ENDOSCOPY;  Service: Gastroenterology;  Laterality: N/A;   HEMORRHOID SURGERY     HEMOSTASIS CLIP PLACEMENT  01/26/2022   Procedure: HEMOSTASIS CLIP PLACEMENT;  Surgeon: Sherrilyn Rist, MD;  Location: [facility] ENDOSCOPY;  Service: Gastroenterology;;   HEMOSTASIS CONTROL  01/26/2022   Procedure: HEMOSTASIS CONTROL;  Surgeon: Sherrilyn Rist, MD;  Location: [facility] ENDOSCOPY;  Service: Gastroenterology;;   HOT HEMOSTASIS N/A 01/26/2022   Procedure: HOT HEMOSTASIS (ARGON PLASMA COAGULATION/BICAP);  Surgeon: Sherrilyn Rist, MD;  Location: [facility] ENDOSCOPY;  Service: Gastroenterology;  Laterality: N/A;   MOLE REMOVAL  2022   hand   SCLEROTHERAPY  01/26/2022   Procedure: SCLEROTHERAPY;  Surgeon: Sherrilyn Rist, MD;  Location: [facility] ENDOSCOPY;  Service: Gastroenterology;;   TOE SURGERY  04/2020   TRANSURETHRAL RESECTION OF PROSTATE N/A 05/23/2015   Procedure: TRANSURETHRAL RESECTION OF THE PROSTATE (TURP);  Surgeon: Bjorn Pippin, MD;  Location: [facility];  Service: Urology;  Laterality: N/A;     A IV Location/Drains/Wounds Patient Lines/Drains/Airways Status     Active Line/Drains/Airways     Name Placement date Placement time Site Days   Peripheral IV 04/19/23 18 G 1" Anterior;Distal;Left;Upper Arm 04/19/23  --  Arm  less than 1            Intake/Output Last 24 hours No intake or output data in the 24 hours ending 04/19/23 1843  Labs/Imaging Results for orders  placed or performed during the hospital encounter of 04/19/23 (from the past 48 hours)  Comprehensive metabolic panel     Status: Abnormal   Collection Time: 04/19/23 12:52 PM  Result Value Ref Range   Sodium 137 135 - 145 mmol/L   Potassium 4.7 3.5 - 5.1 mmol/L   Chloride 98 98 - 111 mmol/L   CO2 26 22 - 32 mmol/L   Glucose, Bld 206 (H) 70 - 99 mg/dL    Comment: Glucose reference range applies only to samples taken after fasting for at least 8 hours.   BUN 13 8 - 23 mg/dL   Creatinine, Ser 1.61 0.61 - 1.24 mg/dL   Calcium 9.2 8.9 - 09.6 mg/dL   Total Protein 6.8 6.5 - 8.1 g/dL   Albumin 3.3 (L) 3.5 - 5.0 g/dL   AST 23 15 - 41 U/L   ALT 35 0 - 44 U/L   Alkaline Phosphatase 61 38 - 126 U/L   Total Bilirubin 0.8 <1.2 mg/dL   GFR, Estimated >04 >54 mL/min    Comment: (NOTE) Calculated using the CKD-EPI Creatinine Equation (2021)    Anion gap 13 5 - 15    Comment: Performed at [facility], [address] [place], [place] [address]  CBC with Differential  Status: Abnormal   Collection Time: 04/19/23 12:52 PM  Result Value Ref Range   WBC 10.7 (H) 4.0 - 10.5 K/uL   RBC 4.23 4.22 - 5.81 MIL/uL   Hemoglobin 12.9 (L) 13.0 - 17.0 g/dL   HCT 63.8 (L) 75.6 - 43.3 %   MCV 91.3 80.0 - 100.0 fL   MCH 30.5 26.0 - 34.0 pg   MCHC 33.4 30.0 - 36.0 g/dL   RDW 29.5 18.8 - 41.6 %   Platelets 268 150 - 400 K/uL   nRBC 0.0 0.0 - 0.2 %   Neutrophils Relative % 92 %   Neutro Abs 9.9 (H) 1.7 - 7.7 K/uL   Lymphocytes Relative 3 %   Lymphs Abs 0.3 (L) 0.7 - 4.0 K/uL   Monocytes Relative 4 %   Monocytes Absolute 0.4 0.1 - 1.0 K/uL   Eosinophils Relative 0 %   Eosinophils Absolute 0.0 0.0 - 0.5 K/uL   Basophils Relative 0 %   Basophils Absolute 0.0 0.0 - 0.1 K/uL   Immature Granulocytes 1 %   Abs Immature Granulocytes 0.08 (H) 0.00 - 0.07 K/uL    Comment: Performed at [facility], [address], [place], [place] [address]  Lactic acid, plasma     Status: None   Collection  Time: 04/19/23 12:52 PM  Result Value Ref Range   Lactic Acid, Venous 1.5 0.5 - 1.9 mmol/L    Comment: Performed at [facility], [address], [place], [place] [address]  Resp panel by RT-PCR (RSV, Flu A&B, Covid) Urine, Catheterized     Status: None   Collection Time: 04/19/23 12:52 PM   Specimen: Urine, Catheterized; Nasal Swab  Result Value Ref Range   SARS Coronavirus 2 by RT PCR NEGATIVE NEGATIVE   Influenza A by PCR NEGATIVE NEGATIVE   Influenza B by PCR NEGATIVE NEGATIVE    Comment: (NOTE) The Xpert Xpress SARS-CoV-2/FLU/RSV plus assay is intended as an aid in the diagnosis of influenza from Nasopharyngeal swab specimens and should not be used as a sole basis for treatment. Nasal washings and aspirates are unacceptable for Xpert Xpress SARS-CoV-2/FLU/RSV testing.  Fact Sheet for Patients: BloggerCourse.com  Fact Sheet for Healthcare Providers: SeriousBroker.it  This test is not yet approved or cleared by the [place] FDA and has been authorized for detection and/or diagnosis of SARS-CoV-2 by FDA under an Emergency Use Authorization (EUA). This EUA will remain in effect (meaning this test can be used) for the duration of the COVID-19 declaration under Section 564(b)(1) of the Act, 21 U.S.C. section 360bbb-3(b)(1), unless the authorization is terminated or revoked.     Resp Syncytial Virus by PCR NEGATIVE NEGATIVE    Comment: (NOTE) Fact Sheet for Patients: BloggerCourse.com  Fact Sheet for Healthcare Providers: SeriousBroker.it  This test is not yet approved or cleared by the [place] FDA and has been authorized for detection and/or diagnosis of SARS-CoV-2 by FDA under an Emergency Use Authorization (EUA). This EUA will remain in effect (meaning this test can be used) for the duration of the COVID-19 declaration under Section 564(b)(1) of the Act, 21  U.S.C. section 360bbb-3(b)(1), unless the authorization is terminated or revoked.  Performed at [facility], [address], [place], [place] [address]   Urinalysis, Routine w reflex microscopic -Urine, Catheterized     Status: Abnormal   Collection Time: 04/19/23  1:05 PM  Result Value Ref Range   Color, Urine YELLOW YELLOW   APPearance CLEAR CLEAR  Specific Gravity, Urine 1.019 1.005 - 1.030   pH 7.0 5.0 - 8.0   Glucose, UA NEGATIVE NEGATIVE mg/dL   Hgb urine dipstick NEGATIVE NEGATIVE   Bilirubin Urine NEGATIVE NEGATIVE   Ketones, ur 20 (A) NEGATIVE mg/dL   Protein, ur NEGATIVE NEGATIVE mg/dL   Nitrite NEGATIVE NEGATIVE   Leukocytes,Ua NEGATIVE NEGATIVE    Comment: Performed at [facility], [address], [place], [place] [address]  Brain natriuretic peptide     Status: None   Collection Time: 04/19/23  4:56 PM  Result Value Ref Range   B Natriuretic Peptide 82.9 0.0 - 100.0 pg/mL    Comment: Performed at [facility], [address] [place], [place] [address]  Troponin I (High Sensitivity)     Status: None   Collection Time: 04/19/23  4:56 PM  Result Value Ref Range   Troponin I (High Sensitivity) 6 <18 ng/L    Comment: (NOTE) Elevated high sensitivity troponin I (hsTnI) values and significant  changes across serial measurements may suggest ACS but many other  chronic and acute conditions are known to elevate hsTnI results.  Refer to the "Links" section for chest pain algorithms and additional  guidance. Performed at [facility], [address] [place], [place] [address]    CT CHEST ABDOMEN PELVIS W CONTRAST Result Date: 04/19/2023 CLINICAL DATA:  Sepsis. EXAM: CT CHEST, ABDOMEN, AND PELVIS WITH CONTRAST TECHNIQUE: Multidetector CT imaging of the chest, abdomen and pelvis was performed following the standard protocol during bolus administration of intravenous contrast. RADIATION DOSE REDUCTION: This exam was performed according to the  departmental dose-optimization program which includes automated exposure control, adjustment of the mA and/or kV according to patient size and/or use of iterative reconstruction technique. CONTRAST:  75mL OMNIPAQUE IOHEXOL 350 MG/ML SOLN COMPARISON:  Chest CT 03/31/2023.  PET-CT 10/20/2022. FINDINGS: CT CHEST FINDINGS Cardiovascular: The heart size is normal. No substantial pericardial effusion. Coronary artery calcification is evident. Mild atherosclerotic calcification is noted in the wall of the thoracic aorta. Ascending thoracic aorta measures 4 cm diameter. Although pulmonary arteries are poorly opacified, filling defects in segmental right upper lobe and segmental/subsegmental right lower lobe pulmonary arteries are compatible with acute pulmonary embolus. Mediastinum/Nodes: No mediastinal lymphadenopathy. There is no hilar lymphadenopathy. The esophagus has normal imaging features. There is no axillary lymphadenopathy. Lungs/Pleura: Ground-glass and consolidative airspace disease in the posterior right lower lobe may be infectious/inflammatory although pulmonary infarct could have this appearance. 5 mm right lower lobe nodule on image 97/5 is new in the interval. There is some consolidative airspace disease in the posterior left lung base including a 12 mm left lower lobe nodule on image 106/5, similar to prior. No pleural effusion. Musculoskeletal: No worrisome lytic or sclerotic osseous abnormality. CT ABDOMEN PELVIS FINDINGS Hepatobiliary: No suspicious focal abnormality within the liver parenchyma. Tiny hypervascular focus in the subcapsular right liver on 53/3 is too small to characterize but is statistically most likely benign and may be a tiny flash filling hemangioma or vascular malformation. No followup imaging is recommended. There is no evidence for gallstones, gallbladder wall thickening, or pericholecystic fluid. No intrahepatic or extrahepatic biliary dilation. Pancreas: 4.2 x 2.7 cm cystic  lesion in the head of the pancreas is similar to prior. Adjacent smaller cystic foci again noted. No main duct dilatation. Spleen: No splenomegaly. No suspicious focal mass lesion. Adrenals/Urinary Tract: No adrenal nodule or mass. Left kidney unremarkable. Tiny hypodensities in the left kidney are too small to  characterize but are likely benign cyst. No followup imaging is recommended. No evidence for hydroureter. The urinary bladder appears normal for the degree of distention. Stomach/Bowel: Stomach is unremarkable. No gastric wall thickening. No evidence of outlet obstruction. Duodenum is normally positioned as is the ligament of Treitz. No small bowel wall thickening. No small bowel dilatation. Nonvisualization of the appendix is consistent with the reported history of appendectomy. No gross colonic mass. No colonic wall thickening. Rectal anastomosis evident. Vascular/Lymphatic: There is moderate atherosclerotic calcification of the abdominal aorta without aneurysm. There is no gastrohepatic or hepatoduodenal ligament lymphadenopathy. No retroperitoneal or mesenteric lymphadenopathy. No pelvic sidewall lymphadenopathy. Reproductive: Low-density in the central prostate gland suggests TURP defect. Other: No intraperitoneal free fluid. Musculoskeletal: No worrisome lytic or sclerotic osseous abnormality. IMPRESSION: 1. Acute pulmonary embolus in segmental right upper lobe and segmental/subsegmental right lower lobe pulmonary arteries. 2. Ground-glass and consolidative airspace disease in the posterior right lower lobe may be infectious/inflammatory although pulmonary infarct could have this appearance. 3. 5 mm right lower lobe pulmonary nodule is new in the interval. No follow-up needed if patient is low-risk.This recommendation follows the consensus statement: Guidelines for Management of Incidental Pulmonary Nodules Detected on CT Images: From the Fleischner Society 2017; Radiology 2017; 284:228-243. 4. 12 mm  left lower lobe pulmonary nodule, similar to prior. This was evaluated by PET-CT 10/28/2022 and found to be non hypermetabolic. 5. 4.2 x 2.7 cm cystic lesion in the head of the pancreas is similar to prior. This lesion was also not hypermetabolic on 10/28/2022 PET imaging and was characterized as stable compared back to 01/26/2022. PET-CT recommendations indicated 2 year follow-up from that exam. Please see that report for full details. 6.  Aortic Atherosclerosis (ICD10-I70.0). Critical Value/emergent results were called by telephone at the time of interpretation on 04/19/2023 at 4:03 pm to provider Dr. Julieanne Manson, who verbally acknowledged these results. Electronically Signed   By: Kennith Center M.D.   On: 04/19/2023 16:03   CT L-SPINE NO CHARGE Result Date: 04/19/2023 CLINICAL DATA:  Sepsis. EXAM: CT LUMBAR SPINE WITH CONTRAST TECHNIQUE: Technique: Multiplanar CT images of the lumbar spine were reconstructed from contemporary CT of the Abdomen and Pelvis. RADIATION DOSE REDUCTION: This exam was performed according to the departmental dose-optimization program which includes automated exposure control, adjustment of the mA and/or kV according to patient size and/or use of iterative reconstruction technique. CONTRAST:  No additional. COMPARISON:  CTA abdomen and pelvis 01/26/2022 FINDINGS: Segmentation: 5 lumbar type vertebrae. Alignment: Mild lumbar dextroscoliosis. Straightening of the normal lumbar lordosis. No significant listhesis. Vertebrae: No acute fracture or suspicious osseous lesion. No interval bone destruction suggestive of discitis/osteomyelitis. Paraspinal and other soft tissues: No acute abnormality in the paraspinal soft tissues. Remainder of the abdomen and pelvis reported separately. 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 Mild-to-moderate neural foraminal stenosis on  the left at L3-4 and on the right at L5-S1. IMPRESSION: 1. No acute osseous abnormality. 2. 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 3. Mild-to-moderate multilevel neural foraminal stenosis. Electronically Signed   By: Sebastian Ache M.D.   On: 04/19/2023 15:03   CT Head Wo Contrast Result Date: 04/19/2023 CLINICAL DATA:  Mental status change, unknown cause. EXAM: CT HEAD WITHOUT CONTRAST TECHNIQUE: Contiguous axial images were obtained from the base of the skull through the vertex without intravenous contrast. RADIATION DOSE REDUCTION: This exam was performed according to  the departmental dose-optimization program which includes automated exposure control, adjustment of the mA and/or kV according to patient size and/or use of iterative reconstruction technique. COMPARISON:  Head CT 04/01/2023 and MRI 10/30/2022 FINDINGS: Brain: There is no evidence of an acute infarct, intracranial hemorrhage, mass, midline shift, or extra-axial fluid collection. Cerebral white matter hypodensities are unchanged and nonspecific but compatible with mild chronic small vessel ischemic disease. There is moderate cerebral atrophy. Vascular: Calcified atherosclerosis at the skull base. No hyperdense vessel. Skull: No acute fracture or suspicious osseous lesion. Sinuses/Orbits: Minimal mucosal thickening in the paranasal sinuses. Clear mastoid air cells. Unremarkable orbits. Other: None. IMPRESSION: 1. No evidence of acute intracranial abnormality. 2. 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 Electronically Signed   By: Sebastian Ache M.D.   On: 04/19/2023 14:52   DG Chest Portable 1 View Result Date: 04/19/2023 CLINICAL DATA:  Fever.  Altered mental status. EXAM: PORTABLE CHEST 1 VIEW COMPARISON:  Chest radiograph dated March 31, 2023. FINDINGS: The heart size and mediastinal contours are within normal limits. Aortic atherosclerosis. Mild left basilar atelectasis/scarring. No focal consolidation,  sizeable pleural effusion, or pneumothorax. No acute osseous abnormality. IMPRESSION: Mild left basilar atelectasis or scarring. Otherwise, no acute cardiopulmonary findings. Electronically Signed   By: Hart Robinsons M.D.   On: 04/19/2023 13:34    Pending Labs Unresulted Labs (From admission, onward)     Start     Ordered   04/20/23 0500  Comprehensive metabolic panel  Tomorrow morning,   R        04/19/23 1655   04/20/23 0500  CBC  Tomorrow morning,   R        04/19/23 1655   04/20/23 0500  Heparin level (unfractionated)  Daily,   R      04/19/23 1755   04/20/23 0500  CBC  Daily,   R      04/19/23 1755   04/19/23 1656  Procalcitonin  Daily,   R     References:    Procalcitonin Lower Respiratory Tract Infection AND Sepsis Procalcitonin Algorithm   04/19/23 1655   04/19/23 1152  Culture, blood (routine x 2)  BLOOD CULTURE X 2,   R (with STAT occurrences)      04/19/23 1152            Vitals/Pain Today's Vitals   04/19/23 1315 04/19/23 1415 04/19/23 1500 04/19/23 1700  BP: (!) 141/69 (!) 141/71 137/68   Pulse: 80 78 76   Resp: 13 (!) 23 (!) 22   Temp:   98.6 F (37 C)   TempSrc:   Oral   SpO2: 93% 94% 93%   Weight:    81.3 kg  Height:    6' (1.829 m)  PainSc:        Isolation Precautions No active isolations  Medications Medications  vancomycin (VANCOCIN) IVPB 1000 mg/200 mL premix (has no administration in time range)    Followed by  vancomycin (VANCOREADY) IVPB 750 mg/150 mL (has no administration in time range)  amLODipine (NORVASC) tablet 5 mg (has no administration in time range)  rosuvastatin (CRESTOR) tablet 20 mg (has no administration in time range)  donepezil (ARICEPT) tablet 10 mg (has no administration in time range)  cefTRIAXone (ROCEPHIN) 2 g in sodium chloride 0.9 % 100 mL IVPB (has no administration in time range)  sodium chloride flush (NS) 0.9 % injection 3 mL (has no administration in time range)  acetaminophen (TYLENOL) tablet 650 mg (has no  administration in time range)    Or  acetaminophen (TYLENOL) suppository 650 mg (has no administration in time range)  polyethylene glycol (MIRALAX / GLYCOLAX) packet 17 g (has no administration in time range)  pantoprazole (PROTONIX) EC tablet 40 mg (has no administration in time range)  heparin bolus via infusion 5,000 Units (has no administration in time range)  heparin ADULT infusion 100 units/mL (25000 units/281mL) (has no administration in time range)  azithromycin (ZITHROMAX) 500 mg in sodium chloride 0.9 % 250 mL IVPB (has no administration in time range)  sodium chloride 0.9 % bolus 1,000 mL (1,000 mLs Intravenous New Bag/Given 04/19/23 1303)  ketorolac (TORADOL) 30 MG/ML injection 30 mg (30 mg Intravenous Given 04/19/23 1450)  iohexol (OMNIPAQUE) 350 MG/ML injection 75 mL (75 mLs Intravenous Contrast Given 04/19/23 1429)  piperacillin-tazobactam (ZOSYN) IVPB 3.375 g (0 g Intravenous Stopped 04/19/23 1752)    Mobility walks with person assist     Focused Assessments Possible pulmonary for aspiration PNX, has a PE, was nonverbal upon arrival   R Recommendations: See Admitting Provider Note  Report given to:   Additional Notes: family at bedside, has lots of questions, pt is eating now, condom cath on,

## 2023-04-19 NOTE — H&P (Signed)
History and Physical   Walter [name] WUJ:[id number] DOB: 1940/10/26 DOA: 04/19/2023  PCP: Shelva Majestic, MD   Patient coming from: Home  Chief Complaint: AMS  HPI: Walter [name] is a 82 y.o. male with medical history significant of hypertension, hyperlipidemia, diabetes, OSA on CPAP, BPH, aspiration, PTSD, anxiety, depression, dementia, pancreatic cyst presenting with altered mental status.  Note, patient recently admitted from 11/29 until 12/1.  Admitted with altered mental status in the setting of sepsis secondary to aspiration pneumonia at the bilateral bases.  Alterman status also believed to have a delirium component in the setting of chronic dementia.  Was treated with antibiotics with improvement.  On discharge patient was alert and oriented to self only.  Plan for outpatient neurology follow-up.  Patient did follow-up with neurology who recommended continue donepezil and continued Lexapro/Seroquel for PTSD/anxiety/depression.  Further neuropsychiatric testing to be repeated in 2025.  Patient has not returned to previous baseline prior to that admission and is unclear if he will.  However for the past 2 to 3 days he has had significant worsening initially with increasing weakness and now significantly increased confusion with some associated agitation.  And his confusion now appears similar to how it was just prior to his recent admission.  Patient unable to fully participate in review of systems due to altered mental status and baseline dementia.  ED Course: Vital signs in the ED notable for blood pressure in the 130s to 140s systolic, respiratory rate in the teens to 20s, borderline hypoxia.  Lab workup included CMP with albumin 3.3 glucose 206.  CBC with hemoglobin stable 12.9 and borderline leukocytosis to 10.7.  Lactic acid normal with repeat pending.  BNP and troponin pending.  Respiratory panel for flu COVID and RSV negative.  Urinalysis with ketones only.  Blood cultures pending.   Imaging studies included chest x-ray which showed left basilar atelectasis versus scar, CT head which showed no acute abnormality.  CT L-spine showed no acute normality but did show mild stenosis in multiple regions.  CT of the chest abdomen and pelvis with contrast demonstrated a segmental/subsegmental pulmonary emboli at right upper lobe and right lower lobe.  Also noted was groundglass opacity and consolidation consistent with inflammation versus infection though cannot rule out pulmonary infarct, this was at right lower lobe.  Also noted was a stable 12 mm nodule and new 5 mm nodule.  Stable pancreatic cysts also described.  Patient ordered vancomycin and Zosyn in the ED as well as Toradol and 1 L IV fluids.  Review of Systems: As per HPI otherwise all other systems reviewed and are negative.  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 All started after a hemorrhoid surgery and later with urinary retention. Patient at one point was septic from urinary issues.     Diabetes mellitus type II, controlled 04/23/2007    Metformin 1g BID, amaryl 4mg --> 8mg , had to add [place] back  Never took victoza.   Januvia-lethargic and dizzy in past. 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 Follows with Dr. 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   Pulmonary nodule 02/04/2022   Sepsis secondary to UTI 03/29/2015   Severe sepsis (HCC) 03/31/2023    Past Surgical History:  Procedure Laterality Date   AMPUTATION TOE Right 09/14/2016   Procedure: RIGHT 2ND TOE/RAY AMPUTATION;  Surgeon: Tarry Kos, MD;  Location: [facility];  Service: Orthopedics;  Laterality: Right;   APPENDECTOMY  05/03/1947   BIOPSY  01/28/2022   Procedure: BIOPSY;  Surgeon: Sherrilyn Rist, MD;  Location: [facility] ENDOSCOPY;  Service: Gastroenterology;;   COLECTOMY  05/02/1990   CYSTOSCOPY N/A 06/21/2015   Procedure: CYSTOSCOPY, CLOT EVACUATION, AND CAUTERIZATION OF PROSTATE FOSSA;  Surgeon: Jethro Bolus, MD;  Location: [facility];  Service: Urology;  Laterality: N/A;   ESOPHAGOGASTRODUODENOSCOPY (EGD) WITH PROPOFOL N/A 01/26/2022   Procedure: ESOPHAGOGASTRODUODENOSCOPY (EGD) WITH PROPOFOL;  Surgeon: Sherrilyn Rist, MD;  Location: [facility] ENDOSCOPY;  Service: Gastroenterology;  Laterality: N/A;   ESOPHAGOGASTRODUODENOSCOPY (EGD) WITH PROPOFOL N/A 01/28/2022   Procedure: ESOPHAGOGASTRODUODENOSCOPY (EGD) WITH PROPOFOL;  Surgeon: Sherrilyn Rist, MD;  Location: [facility] ENDOSCOPY;  Service: Gastroenterology;  Laterality: N/A;   HEMORRHOID SURGERY     HEMOSTASIS CLIP PLACEMENT  01/26/2022   Procedure: HEMOSTASIS CLIP PLACEMENT;  Surgeon: Sherrilyn Rist, MD;  Location: [facility]COPY;  Service: Gastroenterology;;   HEMOSTASIS  CONTROL  01/26/2022   Procedure: HEMOSTASIS CONTROL;  Surgeon: Sherrilyn Rist, MD;  Location[facility];  Service: Gastroenterology;;   HOT HEMOSTASIS N/A 01/26/2022   Procedure: HOT HEMOSTASIS (ARGON PLASMA COAGULATION/BICAP);  Surgeon: Sherrilyn Rist, MD;  Location: [facility] ENDOSCOPY;  Service: Gastroenterology;  Laterality: N/A;   MOLE REMOVAL  2022   hand   SCLEROTHERAPY  01/26/2022   Procedure: SCLEROTHERAPY;  Surgeon: Sherrilyn Rist, MD;  Location[facility] ENDOSCOPY;  Service: Gastroenterology;;   TOE SURGERY  04/2020   TRANSURETHRAL RESECTION OF PROSTATE N/A 05/23/2015   Procedure: TRANSURETHRAL RESECTION OF THE PROSTATE (TURP);  Surgeon: Bjorn Pippin, MD;  Location: [facility];  Service: Urology;  Laterality: N/A;    Social History  reports that he has quit smoking. His smoking use included pipe and cigarettes. He has a 2.5 pack-year smoking history. He has never used smokeless tobacco. He reports current alcohol use of about 2.0 standard drinks of alcohol per week. He reports that he does not use drugs.  Allergies  Allergen Reactions   Bactrim [Sulfamethoxazole-Trimethoprim] Nausea And Vomiting    Pt has chills and fevers also.    Family History  Problem Relation Age of Onset   Alzheimer's disease Mother    Colon cancer Father 78   Diabetes Father  type I   Colon cancer Paternal Grandmother 72   Rectal cancer Maternal Uncle    Cancer Other        colon/fhx   Esophageal cancer Neg Hx    Stomach cancer Neg Hx   Reviewed on admission  Prior to Admission medications   Medication Sig Start Date End Date Taking? Authorizing Provider  amLODipine (NORVASC) 5 MG tablet TAKE 1 TABLET BY MOUTH EVERY DAY 01/10/23  Yes Shelva Majestic, MD  [employer] STOMACH RELIEF 262 MG chewable tablet Chew 2 tablets by mouth 4 (four) times daily. 02/07/22  Yes [provider]  donepezil (ARICEPT) 10 MG tablet TAKE 1 TABLET BY MOUTH EVERYDAY AT BEDTIME Patient taking differently: Take 10 mg  by mouth at bedtime. 12/14/22  Yes Van Clines, MD  ferrous sulfate 325 (65 FE) MG tablet Take 1 tablet (325 mg total) by mouth 2 (two) times daily with a meal. 01/29/22  Yes Narda Bonds, MD  glipiZIDE (GLUCOTROL) 10 MG tablet TAKE 1 TABLET (10 MG TOTAL) BY MOUTH TWICE A DAY BEFORE A MEAL 05/05/22  Yes Shelva Majestic, MD  Glucagon (GVOKE HYPOPEN 2-PACK) 0.5 MG/0.1ML SOAJ Inject 0.5 mg into the skin daily as needed (for low blood sugar). 02/04/22  Yes Shelva Majestic, MD  metFORMIN (GLUCOPHAGE) 1000 MG tablet TAKE 1 TABLET BY MOUTH TWICE A DAY WITH A MEAL 01/10/23  Yes Shelva Majestic, MD  rosuvastatin (CRESTOR) 20 MG tablet TAKE 1 TABLET BY MOUTH ONE TIME PER WEEK 06/21/22  Yes Shelva Majestic, MD  sitaGLIPtin (JANUVIA) 100 MG tablet Take 1 tablet (100 mg total) by mouth daily. 04/21/22  Yes Shelva Majestic, MD  telmisartan (MICARDIS) 80 MG tablet Take 1 tablet (80 mg total) by mouth daily. 10/06/22  Yes Shelva Majestic, MD  [facility] ULTRA test strip USE ONCE DAILY AS INSTRUCTED 09/01/22   Shelva Majestic, MD  QUEtiapine (SEROQUEL) 25 MG tablet Take 1 tablet (25 mg total) by mouth at bedtime. Patient not taking: Reported on 04/19/2023 04/02/23   Almon Hercules, MD    Physical Exam: Vitals:   04/19/23 1245 04/19/23 1315 04/19/23 1415 04/19/23 1500  BP: (!) 144/75 (!) 141/69 (!) 141/71 137/68  Pulse: 79 80 78 76  Resp: (!) 22 13 (!) 23 (!) 22  Temp:    98.6 F (37 C)  TempSrc:    Oral  SpO2: 92% 93% 94% 93%    Physical Exam Constitutional:      General: He is not in acute distress.    Appearance: Normal appearance.  HENT:     Head: Normocephalic and atraumatic.     Mouth/Throat:     Mouth: Mucous membranes are moist.     Pharynx: Oropharynx is clear.  Eyes:     Extraocular Movements: Extraocular movements intact.     Pupils: Pupils are equal, round, and reactive to light.  Cardiovascular:     Rate and Rhythm: Normal rate and regular rhythm.     Pulses: Normal pulses.      Heart sounds: Normal heart sounds.  Pulmonary:     Effort: Pulmonary effort is normal. No respiratory distress.     Breath sounds: Normal breath sounds.  Abdominal:     General: Bowel sounds are normal. There is no distension.     Palpations: Abdomen is soft.     Tenderness: There is no abdominal tenderness.  Musculoskeletal:        General: No swelling or deformity.  Skin:    General: Skin is warm and dry.  Neurological:     General: No focal deficit present.     Comments: Responding to some verbal questions.  Not always entirely appropriately, but does answer some appropriately with short answers.  Nods along when being spoken to.  Follows simple commands.    Labs on Admission: I have personally reviewed following labs and imaging studies  CBC: Recent Labs  Lab 04/19/23 1252  WBC 10.7*  NEUTROABS 9.9*  HGB 12.9*  HCT 38.6*  MCV 91.3  PLT 268    Basic Metabolic Panel: Recent Labs  Lab 04/19/23 1252  NA 137  K 4.7  CL 98  CO2 26  GLUCOSE 206*  BUN 13  CREATININE 0.93  CALCIUM 9.2    GFR: Estimated Creatinine Clearance: 67.2 mL/min (by C-G formula based on SCr of 0.93 mg/dL).  Liver Function Tests: Recent Labs  Lab 04/19/23 1252  AST 23  ALT 35  ALKPHOS 61  BILITOT 0.8  PROT 6.8  ALBUMIN 3.3*    Urine analysis:    Component Value Date/Time   COLORURINE YELLOW 04/19/2023 1305   APPEARANCEUR CLEAR 04/19/2023 1305   LABSPEC 1.019 04/19/2023 1305   PHURINE 7.0 04/19/2023 1305   GLUCOSEU NEGATIVE 04/19/2023 1305   GLUCOSEU NEGATIVE 01/11/2017 1146   HGBUR NEGATIVE 04/19/2023 1305   HGBUR negative 11/06/2009 0854   BILIRUBINUR NEGATIVE 04/19/2023 1305   BILIRUBINUR negative 12/27/2022 0814   KETONESUR 20 (A) 04/19/2023 1305   PROTEINUR NEGATIVE 04/19/2023 1305   UROBILINOGEN 0.2 12/27/2022 0814   UROBILINOGEN 0.2 01/11/2017 1146   NITRITE NEGATIVE 04/19/2023 1305   LEUKOCYTESUR NEGATIVE 04/19/2023 1305    Radiological Exams on Admission: CT  CHEST ABDOMEN PELVIS W CONTRAST Result Date: 04/19/2023 CLINICAL DATA:  Sepsis. EXAM: CT CHEST, ABDOMEN, AND PELVIS WITH CONTRAST TECHNIQUE: Multidetector CT imaging of the chest, abdomen and pelvis was performed following the standard protocol during bolus administration of intravenous contrast. RADIATION DOSE REDUCTION: This exam was performed according to the departmental dose-optimization program which includes automated exposure control, adjustment of the mA and/or kV according to patient size and/or use of iterative reconstruction technique. CONTRAST:  75mL OMNIPAQUE IOHEXOL 350 MG/ML SOLN COMPARISON:  Chest CT 03/31/2023.  PET-CT 10/20/2022. FINDINGS: CT CHEST FINDINGS Cardiovascular: The heart size is normal. No substantial pericardial effusion. Coronary artery calcification is evident. Mild atherosclerotic calcification is noted in the wall of the thoracic aorta. Ascending thoracic aorta measures 4 cm diameter. Although pulmonary arteries are poorly opacified, filling defects in segmental right upper lobe and segmental/subsegmental right lower lobe pulmonary arteries are compatible with acute pulmonary embolus. Mediastinum/Nodes: No mediastinal lymphadenopathy. There is no hilar lymphadenopathy. The esophagus has normal imaging features. There is no axillary lymphadenopathy. Lungs/Pleura: Ground-glass and consolidative airspace disease in the posterior right lower lobe may be infectious/inflammatory although pulmonary infarct could have this appearance. 5 mm right lower lobe nodule on image 97/5 is new in the interval. There is some consolidative airspace disease in the posterior left lung base including a 12 mm left lower lobe nodule on image 106/5, similar to prior. No pleural effusion. Musculoskeletal: No worrisome lytic or sclerotic osseous abnormality. CT ABDOMEN PELVIS FINDINGS Hepatobiliary: No suspicious focal abnormality within the liver parenchyma. Tiny hypervascular focus in the subcapsular  right liver on 53/3 is too small to characterize but is statistically most likely benign and may be a tiny flash filling hemangioma or vascular malformation. No followup imaging is recommended. There is no evidence for  gallstones, gallbladder wall thickening, or pericholecystic fluid. No intrahepatic or extrahepatic biliary dilation. Pancreas: 4.2 x 2.7 cm cystic lesion in the head of the pancreas is similar to prior. Adjacent smaller cystic foci again noted. No main duct dilatation. Spleen: No splenomegaly. No suspicious focal mass lesion. Adrenals/Urinary Tract: No adrenal nodule or mass. Left kidney unremarkable. Tiny hypodensities in the left kidney are too small to characterize but are likely benign cyst. No followup imaging is recommended. No evidence for hydroureter. The urinary bladder appears normal for the degree of distention. Stomach/Bowel: Stomach is unremarkable. No gastric wall thickening. No evidence of outlet obstruction. Duodenum is normally positioned as is the ligament of Treitz. No small bowel wall thickening. No small bowel dilatation. Nonvisualization of the appendix is consistent with the reported history of appendectomy. No gross colonic mass. No colonic wall thickening. Rectal anastomosis evident. Vascular/Lymphatic: There is moderate atherosclerotic calcification of the abdominal aorta without aneurysm. There is no gastrohepatic or hepatoduodenal ligament lymphadenopathy. No retroperitoneal or mesenteric lymphadenopathy. No pelvic sidewall lymphadenopathy. Reproductive: Low-density in the central prostate gland suggests TURP defect. Other: No intraperitoneal free fluid. Musculoskeletal: No worrisome lytic or sclerotic osseous abnormality. IMPRESSION: 1. Acute pulmonary embolus in segmental right upper lobe and segmental/subsegmental right lower lobe pulmonary arteries. 2. Ground-glass and consolidative airspace disease in the posterior right lower lobe may be infectious/inflammatory  although pulmonary infarct could have this appearance. 3. 5 mm right lower lobe pulmonary nodule is new in the interval. No follow-up needed if patient is low-risk.This recommendation follows the consensus statement: Guidelines for Management of Incidental Pulmonary Nodules Detected on CT Images: From the Fleischner Society 2017; Radiology 2017; 284:228-243. 4. 12 mm left lower lobe pulmonary nodule, similar to prior. This was evaluated by PET-CT 10/28/2022 and found to be non hypermetabolic. 5. 4.2 x 2.7 cm cystic lesion in the head of the pancreas is similar to prior. This lesion was also not hypermetabolic on 10/28/2022 PET imaging and was characterized as stable compared back to 01/26/2022. PET-CT recommendations indicated 2 year follow-up from that exam. Please see that report for full details. 6.  Aortic Atherosclerosis (ICD10-I70.0). Critical Value/emergent results were called by telephone at the time of interpretation on 04/19/2023 at 4:03 pm to provider Dr. Julieanne Manson, who verbally acknowledged these results. Electronically Signed   By: Kennith Center M.D.   On: 04/19/2023 16:03   CT L-SPINE NO CHARGE Result Date: 04/19/2023 CLINICAL DATA:  Sepsis. EXAM: CT LUMBAR SPINE WITH CONTRAST TECHNIQUE: Technique: Multiplanar CT images of the lumbar spine were reconstructed from contemporary CT of the Abdomen and Pelvis. RADIATION DOSE REDUCTION: This exam was performed according to the departmental dose-optimization program which includes automated exposure control, adjustment of the mA and/or kV according to patient size and/or use of iterative reconstruction technique. CONTRAST:  No additional. COMPARISON:  CTA abdomen and pelvis 01/26/2022 FINDINGS: Segmentation: 5 lumbar type vertebrae. Alignment: Mild lumbar dextroscoliosis. Straightening of the normal lumbar lordosis. No significant listhesis. Vertebrae: No acute fracture or suspicious osseous lesion. No interval bone destruction suggestive of  discitis/osteomyelitis. Paraspinal and other soft tissues: No acute abnormality in the paraspinal soft tissues. Remainder of the abdomen and pelvis reported separately. 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 Mild-to-moderate neural foraminal stenosis on the left at L3-4 and on the right at L5-S1. IMPRESSION: 1. No acute osseous abnormality. 2. 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 3.  Mild-to-moderate multilevel neural foraminal stenosis. Electronically Signed   By: Sebastian Ache M.D.   On: 04/19/2023 15:03   CT Head Wo Contrast Result Date: 04/19/2023 CLINICAL DATA:  Mental status change, unknown cause. EXAM: CT HEAD WITHOUT CONTRAST TECHNIQUE: Contiguous axial images were obtained from the base of the skull through the vertex without intravenous contrast. RADIATION DOSE REDUCTION: This exam was performed according to the departmental dose-optimization program which includes automated exposure control, adjustment of the mA and/or kV according to patient size and/or use of iterative reconstruction technique. COMPARISON:  Head CT 04/01/2023 and MRI 10/30/2022 FINDINGS: Brain: There is no evidence of an acute infarct, intracranial hemorrhage, mass, midline shift, or extra-axial fluid collection. Cerebral white matter hypodensities are unchanged and nonspecific but compatible with mild chronic small vessel ischemic disease. There is moderate cerebral atrophy. Vascular: Calcified atherosclerosis at the skull base. No hyperdense vessel. Skull: No acute fracture or suspicious osseous lesion. Sinuses/Orbits: Minimal mucosal thickening in the paranasal sinuses. Clear mastoid air cells. Unremarkable orbits. Other: None. IMPRESSION: 1. No evidence of acute intracranial abnormality. 2. 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 Electronically Signed   By: Sebastian Ache M.D.   On: 04/19/2023 14:52   DG Chest Portable 1 View Result Date: 04/19/2023 CLINICAL DATA:  Fever.  Altered mental status. EXAM: PORTABLE CHEST 1 VIEW COMPARISON:  Chest radiograph dated March 31, 2023. FINDINGS: The heart size and mediastinal contours are within normal limits. Aortic atherosclerosis. Mild left basilar atelectasis/scarring. No focal consolidation, sizeable pleural effusion, or pneumothorax. No acute osseous abnormality. IMPRESSION: Mild left basilar atelectasis or scarring. Otherwise, no acute cardiopulmonary findings. Electronically Signed   By: Hart Robinsons M.D.   On: 04/19/2023 13:34    EKG: Not yet performed in emergency department.  Assessment/Plan Active Problems:   Diabetes mellitus type II, controlled   Hyperlipidemia associated with type 2 diabetes mellitus   Obstructive sleep apnea   Essential hypertension   PNA (pneumonia)   BPH (benign prostatic hyperplasia)   Pancreatic cyst   Dementia with behavioral disturbance (HCC)   Acute pulmonary embolism (HCC)   Acute encephalopathy > Believed to be secondary to infection and/or pulmonary embolism in the setting of chronic dementia. > Worsening confusion on the baseline of dementia for the past couple of days now with some agitation.  Similar to previous admission earlier in the month for sepsis. > Electrolytes stable.  No other obvious explanation for symptoms. - Plan for further evaluation/treatment of possible infection as well as pulmonary embolism as below.  Acute pulmonary embolism > On the course evaluation in the ED CT of the chest abdomen pelvis was obtained showing segmental/subsegmental pulmonary emboli of the right upper lobe and right lower lobe. > Has reportedly had decreased activity since last admission. > Borderline O2 saturations but not on oxygen in the ED > Does have history of GI bleeding in 2023 as below given this we will start heparin which can be quickly discontinued and  make sure he is receiving PPI. - Monitor on progressive unit overnight - Heparin per pharmacy - PPI daily - Echocardiogram  Pneumonia > CT scan findings suspicious for possible pneumonia versus pulmonary infarct. > Presenting similar to previous pneumonia with mild leukocytosis and fever to 100.7 (both could be secondary to PE though fever is less likely). > Negative for flu COVID and RSV in the ED.  Received vancomycin and Zosyn in the ED. - Will be monitored in progressive unit for now as above -  De-escalate antibiotics to ceftriaxone and azithromycin tomorrow - Check procalcitonin to further delineate these findings as being pneumonia versus other - Trend fever curve and WBC - Supportive care  History of GI bleed > Was admitted in 2023 for GI bleeding.  Underwent EGD and it oozing duodenal ulcer was noted and patient underwent cautery and clipping.  Has follow-up with GI outpatient and had been on PPI but has since stopped taking this. > Given this was over a year ago we will pursue anticoagulation for pulmonary embolism as above but will start with heparin and restart PPI. - Resume PPI therapy - Trend CBC  Hypertension - Continue home amlodipine - Replace home telmisartan formulary irbesartan  Hyperlipidemia - Continue home rosuvastatin  Diabetes - SSI  OSA - Continue home CPAP  Dementia > History of some delirium and agitation during last admission. - Continue home donepezil  PTSD Anxiety Depression > Mentioned in recent neurology note but not listed on problem list.  Recommended to be addressed appropriately along with treating dementia as cognitive decline can be multifactorial. > Previously had been on Lexapro and Seroquel. - Does not appear to be taking Lexapro or Seroquel any longer  Abnormal CT > Be on pulmonary embolism also noted was new 5 mm lung nodule, stable 12 mm nodule, and stable pancreatic cyst. - Noted  DVT prophylaxis: Heparin Code Status:    DNR/DNI Family Communication:  Updated at bedside  Disposition Plan:   Patient is from:  Home  Anticipated DC to:  Home  Anticipated DC date:  1 to 3 days  Anticipated DC barriers: None  Consults called:  None Admission status:  Observation, progressive  Severity of Illness: The appropriate patient status for this patient is OBSERVATION. Observation status is judged to be reasonable and necessary in order to provide the required intensity of service to ensure the patient's safety. The patient's presenting symptoms, physical exam findings, and initial radiographic and laboratory data in the context of their medical condition is felt to place them at decreased risk for further clinical deterioration. Furthermore, it is anticipated that the patient will be medically stable for discharge from the hospital within 2 midnights of admission.    Synetta Fail MD [facility]  How to contact the [facility] Attending or Consulting provider 7A - 7P or covering provider during after hours 7P -7A, for this patient?   Check the care team in [facility] and look for a) attending/consulting [facility] provider listed and b) the [facility] team listed Log into [email or web address] and use 's universal password to access. If you do not have the password, please contact the hospital operator. Locate the [facility] provider you are looking for under [facility] and page to a number that you can be directly reached. If you still have difficulty reaching the provider, please page the [facility] (Director on Call) for the Hospitalists listed on amion for assistance.  04/19/2023, 4:52 PM

## 2023-04-19 NOTE — Progress Notes (Signed)
PHARMACY - ANTICOAGULATION CONSULT NOTE  Pharmacy Consult for heparin Indication: pulmonary embolus  Allergies  Allergen Reactions   Bactrim [Sulfamethoxazole-Trimethoprim] Nausea And Vomiting    Pt has chills and fevers also.    Patient Measurements: Height: 6' (182.9 cm) Weight: 81.3 kg (179 lb 3.7 oz) IBW/kg (Calculated) : 77.6 Heparin Dosing Weight: TBW  Vital Signs: Temp: 98.6 F (37 C) (12/18 1500) Temp Source: Oral (12/18 1500) BP: 137/68 (12/18 1500) Pulse Rate: 76 (12/18 1500)  Labs: Recent Labs    04/19/23 1252  HGB 12.9*  HCT 38.6*  PLT 268  CREATININE 0.93    Estimated Creatinine Clearance: 67.2 mL/min (by C-G formula based on SCr of 0.93 mg/dL).   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 All started after a hemorrhoid surgery and later with urinary retention. Patient at one point was septic from urinary issues.     Diabetes mellitus type II, controlled 04/23/2007    Metformin 1g BID, amaryl 4mg --> 8mg , had to add [place] back  Never took victoza.   Januvia-lethargic and dizzy in past. 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 Follows with Dr. 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 CT abd/pelvis showed acute PE. Pharmacy consulted to begin IV heparin. No AC PTA noted. No bleeding noted but does have a hx GIB (2023), CBC is stable since 12/10. Renal function is normal.   Goal of Therapy:  Heparin level 0.3-0.7 units/ml Monitor platelets by anticoagulation protocol: Yes   Plan:  Heparin 5000 unit IV bolus then infuse at 1300 units/hr 8 hr heparin level Daily heparin level and CBC Monitor for signs/symptoms of bleeding   Thank you for involving pharmacy in this patient's care.  Loura Back, PharmD, BCPS Clinical Pharmacist Clinical phone for 04/19/2023 is [phone] 04/19/2023 5:11 PM

## 2023-04-19 NOTE — Telephone Encounter (Addendum)
Pt's wife called and E2C2 agent transfered to me for triage. Pt's wife stated that pt wake up more confused this morning, she tried to assist him to the bathroom and he wasn't cooperative. He tried to use the bathroom without pulling his pants down. She tried to talk to him and he is wasn't responding at all.  She checked his Glucose and was 185, HR was 69 and O2 sat was 93%.  Advised wife to take pt to ER to be evaluated, she was concerned about the wait time, I advised her to call EMS to assess him and transport him to the hospital if the determine the need for.

## 2023-04-19 NOTE — ED Provider Triage Note (Signed)
Emergency Medicine Provider Triage Evaluation Note  Walter [name] , a 82 y.o. male  was evaluated in triage.  Pt complains of altered mental status.  History of dementia.  Type 2 diabetes and metabolic encephalopathy.  Increased confusion difficulty walking today.  Recent ED visit and admission for similar presentation on November 29  Review of Systems  Positive: Fever confusion Negative: Limited secondary to nonverbal status/dementia  Physical Exam  BP (!) 142/130 (BP Location: Right Arm)   Pulse 85   Temp (!) 100.7 F (38.2 C) (Axillary)   Resp 16   SpO2 94%  Gen:   Awake, no distress   Resp:  Normal effort  MSK:   Moves extremities without difficulty  Other:    Medical Decision Making  Medically screening exam initiated at 11:55 AM.  Appropriate orders placed.  Walter [name] was informed that the remainder of the evaluation will be completed by another provider, this initial triage assessment does not replace that evaluation, and the importance of remaining in the ED until their evaluation is complete.  Confused and febrile upon arrival.  Hemodynamically stable.  Infectious workup initiated from triage.  Ordered 1 L IV fluids.  Not meeting SIRS criteria at this time in triage.  Remainder of care per ED team   Royanne Foots, DO 04/19/23 1158

## 2023-04-19 NOTE — ED Provider Notes (Signed)
3:38 PM Care assumed from Dr. Troponin.  At time of transfer of care, patient awaiting workup to complete prior to admission for recurrent altered mental status and encephalopathy of still unclear etiology.  Patient reportedly did not have headache or neck pain.  Anticipate admission after workup is completed.  4:11 PM  Imaging returned showing evidence of pulmonary embolism and infarct versus infection.  Will treat with antibiotics for the suspected pneumonia given the fever he presented with.  Will call for admission for further management.  Quick chart review shows the patient has had a hemostasis and acute GI bleeding requiring intervention last year so we will speak with medicine if they want to start heparin now.   Saron Vanorman, Canary Brim, MD 04/19/23 [phone]

## 2023-04-19 NOTE — Telephone Encounter (Signed)
Walter [name]-appears he is in the emergency department being evaluated

## 2023-04-20 ENCOUNTER — Observation Stay (HOSPITAL_COMMUNITY): Payer: Medicare Other

## 2023-04-20 ENCOUNTER — Other Ambulatory Visit: Payer: Self-pay

## 2023-04-20 ENCOUNTER — Telehealth (HOSPITAL_COMMUNITY): Payer: Self-pay

## 2023-04-20 ENCOUNTER — Encounter: Payer: Medicare Other | Admitting: Licensed Clinical Social Worker

## 2023-04-20 ENCOUNTER — Other Ambulatory Visit (HOSPITAL_COMMUNITY): Payer: Self-pay

## 2023-04-20 DIAGNOSIS — I2609 Other pulmonary embolism with acute cor pulmonale: Secondary | ICD-10-CM

## 2023-04-20 DIAGNOSIS — Z86711 Personal history of pulmonary embolism: Secondary | ICD-10-CM | POA: Diagnosis not present

## 2023-04-20 DIAGNOSIS — J189 Pneumonia, unspecified organism: Secondary | ICD-10-CM | POA: Diagnosis not present

## 2023-04-20 DIAGNOSIS — G9341 Metabolic encephalopathy: Secondary | ICD-10-CM | POA: Diagnosis not present

## 2023-04-20 DIAGNOSIS — F419 Anxiety disorder, unspecified: Secondary | ICD-10-CM | POA: Insufficient documentation

## 2023-04-20 DIAGNOSIS — Z8719 Personal history of other diseases of the digestive system: Secondary | ICD-10-CM

## 2023-04-20 DIAGNOSIS — F03918 Unspecified dementia, unspecified severity, with other behavioral disturbance: Secondary | ICD-10-CM | POA: Diagnosis not present

## 2023-04-20 DIAGNOSIS — I2699 Other pulmonary embolism without acute cor pulmonale: Secondary | ICD-10-CM | POA: Diagnosis not present

## 2023-04-20 LAB — RESPIRATORY PANEL BY PCR

## 2023-04-20 LAB — HEPARIN LEVEL (UNFRACTIONATED)
Heparin Unfractionated: 0.1 [IU]/mL — ABNORMAL LOW (ref 0.30–0.70)
Heparin Unfractionated: 0.28 [IU]/mL — ABNORMAL LOW (ref 0.30–0.70)

## 2023-04-20 LAB — ECHOCARDIOGRAM COMPLETE
AR max vel: 2.63 cm2
AV Area VTI: 2.38 cm2
AV Area mean vel: 2.32 cm2
AV Mean grad: 6 mm[Hg]
AV Peak grad: 9.5 mm[Hg]
Ao pk vel: 1.54 m/s
Area-P 1/2: 2.39 cm2
Height: 72 in
S' Lateral: 2.5 cm
Weight: 2857.16 [oz_av]

## 2023-04-20 LAB — COMPREHENSIVE METABOLIC PANEL
ALT: 26 U/L (ref 0–44)
AST: 14 U/L — ABNORMAL LOW (ref 15–41)
Albumin: 2.8 g/dL — ABNORMAL LOW (ref 3.5–5.0)
Alkaline Phosphatase: 49 U/L (ref 38–126)
Anion gap: 8 (ref 5–15)
BUN: 11 mg/dL (ref 8–23)
CO2: 26 mmol/L (ref 22–32)
Calcium: 8.5 mg/dL — ABNORMAL LOW (ref 8.9–10.3)
Chloride: 101 mmol/L (ref 98–111)
Creatinine, Ser: 0.84 mg/dL (ref 0.61–1.24)
GFR, Estimated: >60 mL/min (ref >60)
Glucose, Bld: 129 mg/dL — ABNORMAL HIGH (ref 70–99)
Potassium: 3.5 mmol/L (ref 3.5–5.1)
Sodium: 135 mmol/L (ref 135–145)
Total Bilirubin: 0.8 mg/dL (ref <1.2)
Total Protein: 5.8 g/dL — ABNORMAL LOW (ref 6.5–8.1)

## 2023-04-20 LAB — CBC
HCT: 34.3 % — ABNORMAL LOW (ref 39.0–52.0)
Hemoglobin: 11.8 g/dL — ABNORMAL LOW (ref 13.0–17.0)
MCH: 31.4 pg (ref 26.0–34.0)
MCHC: 34.4 g/dL (ref 30.0–36.0)
MCV: 91.2 fL (ref 80.0–100.0)
Platelets: 255 10*3/uL (ref 150–400)
RBC: 3.76 MIL/uL — ABNORMAL LOW (ref 4.22–5.81)
RDW: 12.7 % (ref 11.5–15.5)
WBC: 13.2 10*3/uL — ABNORMAL HIGH (ref 4.0–10.5)
nRBC: 0 % (ref 0.0–0.2)

## 2023-04-20 LAB — GLUCOSE, CAPILLARY
Glucose-Capillary: 151 mg/dL — ABNORMAL HIGH (ref 70–99)
Glucose-Capillary: 206 mg/dL — ABNORMAL HIGH (ref 70–99)
Glucose-Capillary: 164 mg/dL — ABNORMAL HIGH (ref 70–99)

## 2023-04-20 LAB — PROCALCITONIN: Procalcitonin: <0.1 ng/mL

## 2023-04-20 MED ORDER — HEPARIN BOLUS VIA INFUSION
1000.0000 [IU] | Freq: Once | INTRAVENOUS | Status: AC
  Administered 2023-04-20: 1000 [IU] via INTRAVENOUS
  Filled 2023-04-20: qty 1000

## 2023-04-20 MED ORDER — HEPARIN BOLUS VIA INFUSION
2000.0000 [IU] | Freq: Once | INTRAVENOUS | Status: AC
  Administered 2023-04-20: 2000 [IU] via INTRAVENOUS
  Filled 2023-04-20: qty 2000

## 2023-04-20 MED ORDER — QUETIAPINE FUMARATE 25 MG PO TABS
12.5000 mg | ORAL_TABLET | ORAL | Status: AC
  Administered 2023-04-20: 12.5 mg via ORAL
  Filled 2023-04-20: qty 1

## 2023-04-20 NOTE — Assessment & Plan Note (Signed)
-   SSI and CBG monitoring  °

## 2023-04-20 NOTE — Progress Notes (Signed)
SATURATION QUALIFICATIONS: (This note is used to comply with regulatory documentation for home oxygen)  Patient Saturations on Room Air at Rest = 92%  Patient Saturations on Room Air while Ambulating = 94%  Patient Saturations on Room Air while asleep = 88%  Please briefly explain why patient needs home oxygen: pt saturating well on RA with no supplemental O2 at rest and with mobility. Once returned to bed and pt drifting to sleep pt desating to 88% on RA. When pt awakened SpO2 recovered to >90%. RN aware.  Wynn Maudlin, DPT Acute Rehabilitation Services Office [phone]  04/20/23 12:32 PM

## 2023-04-20 NOTE — Progress Notes (Signed)
PHARMACY - ANTICOAGULATION CONSULT NOTE  Pharmacy Consult for heparin Indication: pulmonary embolus  Allergies  Allergen Reactions   Bactrim [Sulfamethoxazole-Trimethoprim] Nausea And Vomiting    Pt has chills and fevers also.    Patient Measurements: Height: 6' (182.9 cm) Weight: 81 kg (178 lb 9.2 oz) IBW/kg (Calculated) : 77.6 Heparin Dosing Weight: TBW  Vital Signs: Temp: 99.2 F (37.3 C) (12/19 1331) Temp Source: Oral (12/19 1331) BP: 127/63 (12/19 1331) Pulse Rate: 69 (12/19 1331)  Labs: Recent Labs    04/19/23 1252 04/19/23 1656 04/19/23 2145 04/20/23 0408 04/20/23 1553  HGB 12.9*  --   --  11.8*  --   HCT 38.6*  --   --  34.3*  --   PLT 268  --   --  255  --   HEPARINUNFRC  --   --   --  0.10* 0.28*  CREATININE 0.93  --   --  0.84  --   TROPONINIHS  --  6 7  --   --     Estimated Creatinine Clearance: 74.4 mL/min (by C-G formula based on SCr of 0.84 mg/dL).  Assessment: 75 yom with dementia who presented to the ED with AMS. CT abd/pelvis showed acute PE. Pharmacy consulted to begin IV heparin. No AC PTA noted. No bleeding noted but does have a hx GIB (2023), CBC is stable since 12/10. Renal function is normal.   Heparin level just below therapeutic range at 0.28 on 1500 units/hr. No signs of bleeding noted and no issues with infusion per RN.   Goal of Therapy:  Heparin level 0.3-0.7 units/ml Monitor platelets by anticoagulation protocol: Yes   Plan:  Heparin 1000 unit bolus  Increase heparin to 1650 units/hr  8 hr heparin level Daily heparin level and CBC Monitor for signs/symptoms of bleeding   Thank you for involving pharmacy in this patient's care.  Loura Back, PharmD, BCPS Clinical Pharmacist Clinical phone for 04/20/2023 is [phone] 04/20/2023 5:01 PM

## 2023-04-20 NOTE — Progress Notes (Signed)
Pt was attempting to get out of bed numerous times, unable to reorient, not adhering to safety measures being implemented. Notified Hall, DO. See new orders.  Bari Edward, RN

## 2023-04-20 NOTE — Assessment & Plan Note (Addendum)
-   GGO noted on CT - RVP negative; negative covid,flu,rsv on admission  -Continue Rocephin and azithromycin for now.  PCT noted negative x 2; mild leuks, 1% bands

## 2023-04-20 NOTE — Evaluation (Signed)
Physical Therapy Evaluation Patient Details Name: Walter [name] MRN: [id number] DOB: 03-03-1941 Today's Date: 04/20/2023  History of Present Illness  Patient is 82 y.o. male presented to ED with AMS. Pt recently admitted 11/29-12/1 for AMS due to sepsis secondary to PNA. CT of the chest abdomen and pelvis with contrast demonstrated a segmental/subsegmental pulmonary emboli at right upper lobe and right lower lobe. PMH significant of hypertension, hyperlipidemia, diabetes, OSA on CPAP, BPH, aspiration, PTSD, anxiety, depression, dementia, pancreatic cyst.    Clinical Impression  Walter [name] is 82 y.o. male admitted with above HPI and diagnosis. Patient is currently limited by functional impairments below (see PT problem list). Patient lives with spouse and is per chart review discharged last admission at min assist level to amb >100' with RW. Pt unable to provide and no family available to confirm but suspect pt has required min or greater assist at home for all mobility. Currently pt requires min assist for bed mobility and min-mod assist for transfers and gait with RW. Pt fatigued and balance impaired with posterior lean and gradual Rt lean in sitting and standing. Pt limited in gait distance to 2x10' due to fatigue. SpO2 stable on RA with activity but pt noted to desat when supine and drifting to sleep. Patient will benefit from continued skilled PT interventions to address impairments and progress independence with mobility. Patient will benefit from continued inpatient follow up therapy, <3 hours/day. Acute PT will follow and progress as able.         If plan is discharge home, recommend the following: A little help with walking and/or transfers;A little help with bathing/dressing/bathroom;Assistance with cooking/housework;Help with stairs or ramp for entrance;Supervision due to cognitive status   Can travel by private vehicle        Equipment Recommendations  (TBD at next venue/with progress)   Recommendations for Other Services       Functional Status Assessment Patient has had a recent decline in their functional status and demonstrates the ability to make significant improvements in function in a reasonable and predictable amount of time.     Precautions / Restrictions Precautions Precautions: Fall Precaution Comments: R toe amputations effecting balance; consider using his shoes Restrictions Weight Bearing Restrictions Per Provider Order: No      Mobility  Bed Mobility Overal bed mobility: Needs Assistance Bed Mobility: Supine to Sit, Sit to Supine     Supine to sit: HOB elevated, Min assist, Used rails Sit to supine: Min assist, HOB elevated, Used rails   General bed mobility comments: mulimodal cues to initiate supine>sit EOB. Min assist to guide raising trunk. pt with posterior lean sitting EOB and required frequent/constant cues to engage core for anterior weight shift.    Transfers Overall transfer level: Needs assistance Equipment used: Rolling walker (2 wheels) Transfers: Sit to/from Stand Sit to Stand: Min assist           General transfer comment: Min assist for power up/safety. pt using bil UE to initiate rise and hand over hand assist to guide hands onto walker. pt stable in static standing.    Ambulation/Gait Ambulation/Gait assistance: Min assist, Mod assist, +2 safety/equipment Gait Distance (Feet): 10 Feet (2x10) Assistive device: Rolling walker (2 wheels) Gait Pattern/deviations: Step-through pattern, Decreased stride length, Drifts right/left, Decreased step length - left (Lt lean) Gait velocity: decr        Stairs            Wheelchair Mobility     Tilt Bed  Modified Rankin (Stroke Patients Only)       Balance Overall balance assessment: Needs assistance Sitting-balance support: Feet supported Sitting balance-Leahy Scale: Poor Sitting balance - Comments: posterior lean at EOB, mod assist to maintain upright  balance. seated balance improved after initial sit<>stand with min assist to stabilize Postural control: Posterior lean, Right lateral lean   Standing balance-Leahy Scale: Poor Standing balance comment: reliant on external support                             Pertinent Vitals/Pain Pain Assessment Pain Assessment: Faces Faces Pain Scale: Hurts a little bit Pain Location: general body aches Pain Descriptors / Indicators: Aching Pain Intervention(s): Limited activity within patient's tolerance, Monitored during session, Repositioned    Home Living Family/patient expects to be discharged to:: Private residence Living Arrangements: Spouse/significant other;Children (daughters comes over PRN) Available Help at Discharge: Family;Available PRN/intermittently Type of Home: House Home Access: Stairs to enter   Entrance Stairs-Number of Steps: 2 Alternate Level Stairs-Number of Steps: chairlift Home Layout: Two level   Additional Comments: pt questionable historian; no answer from spouse when attempted to call.    Prior Function Prior Level of Function : Independent/Modified Independent             Mobility Comments: ind ADLs Comments: reports ind     Extremity/Trunk Assessment   Upper Extremity Assessment Upper Extremity Assessment: Defer to OT evaluation;Generalized weakness    Lower Extremity Assessment Lower Extremity Assessment: Generalized weakness    Cervical / Trunk Assessment Cervical / Trunk Assessment: Normal  Communication   Communication Communication: No apparent difficulties  Cognition Arousal: Alert Behavior During Therapy: WFL for tasks assessed/performed Overall Cognitive Status: No family/caregiver present to determine baseline cognitive functioning                                 General Comments: Likely at or close to baseline        General Comments      Exercises     Assessment/Plan    PT Assessment Patient  needs continued PT services  PT Problem List Decreased strength;Decreased activity tolerance;Decreased balance;Decreased mobility;Decreased coordination;Decreased cognition;Decreased knowledge of use of DME;Decreased safety awareness;Decreased knowledge of precautions       PT Treatment Interventions DME instruction;Gait training;Stair training;Functional mobility training;Therapeutic activities;Therapeutic exercise;Balance training;Neuromuscular re-education;Cognitive remediation;Patient/family education    PT Goals (Current goals can be found in the Care Plan section)  Acute Rehab PT Goals Patient Stated Goal: pt states "I want to go home" PT Goal Formulation: Patient unable to participate in goal setting Time For Goal Achievement: 05/04/23 Potential to Achieve Goals: Good    Frequency Min 1X/week     Co-evaluation PT/OT/SLP Co-Evaluation/Treatment: Yes Reason for Co-Treatment: For patient/therapist safety;To address functional/ADL transfers PT goals addressed during session: Mobility/safety with mobility;Balance;Proper use of DME OT goals addressed during session: ADL's and self-care       AM-PAC PT "6 Clicks" Mobility  Outcome Measure Help needed turning from your back to your side while in a flat bed without using bedrails?: None Help needed moving from lying on your back to sitting on the side of a flat bed without using bedrails?: A Little Help needed moving to and from a bed to a chair (including a wheelchair)?: A Little Help needed standing up from a chair using your arms (e.g., wheelchair or bedside chair)?: A Little Help  needed to walk in hospital room?: A Lot Help needed climbing 3-5 steps with a railing? : A Lot 6 Click Score: 17    End of Session Equipment Utilized During Treatment: Gait belt Activity Tolerance: Patient tolerated treatment well Patient left: in chair;with call bell/phone within reach;with bed alarm set Nurse Communication: Mobility status PT  Visit Diagnosis: Unsteadiness on feet (R26.81);Other abnormalities of gait and mobility (R26.89)    Time: 0258-5277 PT Time Calculation (min) (ACUTE ONLY): 30 min   Charges:   PT Evaluation $PT Eval Moderate Complexity: 1 Mod   PT General Charges $$ ACUTE PT VISIT: 1 Visit         Wynn Maudlin, DPT Acute Rehabilitation Services Office [phone]  04/20/23 12:48 PM

## 2023-04-20 NOTE — Assessment & Plan Note (Signed)
-   5 mm right lower lobe pulmonary nodule is new in the interval. No follow-up needed if patient is low-risk. - 12 mm left lower lobe pulmonary nodule, similar to prior. This was evaluated by PET-CT 10/28/2022 and found to be non hypermetabolic.

## 2023-04-20 NOTE — Assessment & Plan Note (Signed)
-  Continue nightly CPAP °

## 2023-04-20 NOTE — Hospital Course (Signed)
Walter [name] is an 82 yo male with PMH chronic dementia, HTN, HLD, DM II, OSA on CPAP, BPH, PTSD/anxiety/depression who presented with altered mentation.  Note, patient recently admitted from 11/29 until 12/1.  Admitted with altered mental status in the setting of sepsis secondary to aspiration pneumonia at the bilateral bases.  AMS also believed to have a delirium component in the setting of chronic dementia.  Was treated with antibiotics with improvement.  On discharge patient was alert and oriented to self only.  Plan for outpatient neurology follow-up.   Patient did follow-up with neurology who recommended continue donepezil and continued Lexapro/Seroquel for PTSD/anxiety/depression.  Further neuropsychiatric testing to be repeated in 2025.   Patient has not returned to previous baseline prior to that admission and is unclear if he will.  However for the past 2 to 3 days prior to admission, he has had significant worsening initially with increasing weakness and now significantly increased confusion with some associated agitation. His confusion appeared similar to how it was just prior to his recent admission.  He underwent workup with imaging studies in the ER.  CT was notable for acute PE involving RUL and RLL; there was also groundglass opacities in the right lower lobe concerning for infection. He was started on heparin drip, oxygen, and antibiotics and admitted for further monitoring.

## 2023-04-20 NOTE — Progress Notes (Addendum)
OT Cancellation Note  Patient Details Name: Walter [name] MRN: [id number] DOB: [birth year]   Cancelled Treatment:    Reason Eval/Treat Not Completed: Medical issues which prohibited therapy Pt found to have PE, started on heparin 12/19 @ 1858. Per protocol, therapy to hold 24 hours until OOB activity attempted after initiation of heparin in setting of acute PE.  Lorre Munroe 04/20/2023, 7:17 AM

## 2023-04-20 NOTE — Assessment & Plan Note (Signed)
-    Mentioned in recent neurology note but not listed on problem list.  Recommended to be addressed appropriately along with treating dementia as cognitive decline can be multifactorial. - Previously had been on Lexapro and Seroquel. - Does not appear to be taking Lexapro or Seroquel any longer

## 2023-04-20 NOTE — TOC Initial Note (Signed)
Transition of Care [facility]) - Initial/Assessment Note    Patient Details  Name: Walter [name] MRN: [id number] Date of Birth: [birth year]  Transition of [facility]) CM/SW Contact:    Lockie Pares, RN Phone Number: 04/20/2023, 3:10 PM  Clinical Narrative:                  Patient readmitted after 2 weeks post hospital stay for AMS, PE Pneumonia. PT assessment initially revealed HH, but now recommendations for SNF. The paitent has Home health with [facility] currently, PT OT and SLP services. Daughter does state that they are tired and have to do quite a bit for the patient. Care and verbal cues .   Called and spoke to Wife Walter [name] and their daughter ( on speaker phone) they are on the way to visit him Discussed SNF, differences with HH vs SNF and recommendations. As well as the process in general. They would like to speak to MD regarding this.   Messaged with team to discuss recommendations and next steps.  The family is on the way to the hospital and the provider will discuss  planning with them.   TOC will continue to follow for needs, recommendations, and transitions of care.  Barriers to Discharge: Continued Medical Work up   Patient Goals and CMS Choice     Choice offered to / list presented to : Walter      Expected Discharge Plan and Services In-house Referral: Clinical Social Work     Living arrangements for the past 2 months: Single Family Home                           HH Arranged:  (Active with [facility]  for PT OT SLP)          Prior Living Arrangements/Services Living arrangements for the past 2 months: Single Family Home Lives with:: Walter Patient language and need for interpreter reviewed:: Yes        Need for Family Participation in Patient Care: Yes (Comment) Care giver support system in place?: Yes (comment) Current home services: DME, Home PT (HOme SLP) Criminal Activity/Legal Involvement Pertinent to Current Situation/Hospitalization: No - Comment as  needed  Activities of Daily Living   ADL Screening (condition at time of admission) Independently performs ADLs?: No Does the patient have a NEW difficulty with bathing/dressing/toileting/self-feeding that is expected to last >3 days?: Yes (Initiates electronic notice to provider for possible OT consult) Does the patient have a NEW difficulty with getting in/out of bed, walking, or climbing stairs that is expected to last >3 days?: Yes (Initiates electronic notice to provider for possible PT consult) Does the patient have a NEW difficulty with communication that is expected to last >3 days?: Yes (Initiates electronic notice to provider for possible SLP consult) Is the patient deaf or have difficulty hearing?: Yes Does the patient have difficulty seeing, even when wearing glasses/contacts?: No Does the patient have difficulty concentrating, remembering, or making decisions?: Yes  Permission Sought/Granted      Share Information with NAME: Walter [name]           Emotional Assessment       Orientation: : Fluctuating Orientation (Suspected and/or reported Sundowners)   Psych Involvement: No (comment)  Admission diagnosis:  Acute encephalopathy [G93.40] Altered mental status, unspecified altered mental status type [R41.82] Pneumonia due to infectious organism, unspecified laterality, unspecified part of lung [J18.9] Acute pulmonary embolism, unspecified pulmonary embolism type, unspecified  whether acute cor pulmonale present [facility]) [I26.99] Patient Active Problem List   Diagnosis Date Noted   History of GI bleed 04/20/2023   Anxiety and depression 04/20/2023   Acute pulmonary embolism (HCC) 04/19/2023   Acute encephalopathy 04/19/2023   Dementia with behavioral disturbance (HCC) 04/01/2023   Acute metabolic encephalopathy 03/31/2023   Thyroid nodule 03/31/2023   Lactic acidosis 03/31/2023   History of dysplastic nevus    History of skin cancer    Pulmonary nodule 02/04/2022    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   Allergic rhinitis 08/19/2019   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   BPH (benign prostatic hyperplasia) 07/02/2015   CAP (community acquired pneumonia) 05/19/2015   Hallux valgus of right foot 03/26/2014   History of colon cancer 01/27/2014   Iritis 12/17/2008   Postherpetic neuralgia 05/22/2008   Bigeminy 04/21/2008   Diabetes mellitus type II, controlled 04/23/2007   Hyperlipidemia associated with type 2 diabetes mellitus 04/23/2007   Obstructive sleep apnea 04/23/2007   Essential hypertension 04/23/2007   PCP:  Shelva Majestic, MD Pharmacy:   [facility] [phone] - [place], [place] - [address] AT [address] OF [facility] [address] [place] [address] Phone: [phone] Fax: [phone]     Social Drivers of Health (SDOH) Social History: SDOH Screenings   Food Insecurity: No Food Insecurity (04/20/2023)  Housing: Low Risk  (04/20/2023)  Recent Concern: Housing - High Risk (03/31/2023)  Transportation Needs: No Transportation Needs (04/20/2023)  Utilities: Not At Risk (04/20/2023)  Depression (PHQ2-9): High Risk (04/11/2023)  Financial Resource Strain: Low Risk  (05/12/2022)  Physical Activity: Sufficiently Active (05/12/2022)  Social Connections: Moderately Integrated (05/12/2022)  Stress: No Stress Concern Present (05/12/2022)  Tobacco Use: Medium Risk (04/19/2023)   SDOH Interventions:     Readmission Risk Interventions     No data to display

## 2023-04-20 NOTE — Assessment & Plan Note (Addendum)
-   CT of the chest abdomen pelvis was obtained showing segmental/subsegmental pulmonary emboli of the right upper lobe and right lower lobe. - Has reportedly had decreased activity since last admission.  Wife reports he is very sedentary at home and has had minimal physical activity and ambulation - Does have history of GI bleeding in 2023; no drop in hemoglobin nor evidence of bleeding while on heparin drip - Okay to transition to Eliquis -Will ask pharmacy to see if price check can be obtained - echo negative for heart strain

## 2023-04-20 NOTE — Progress Notes (Signed)
   04/20/23 1941  BiPAP/CPAP/SIPAP  BiPAP/CPAP/SIPAP Pt Type Adult  Reason BIPAP/CPAP not in use Non-compliant

## 2023-04-20 NOTE — Progress Notes (Signed)
Progress Note    Walter [name]   WJX:[id number]  DOB: 10-15-1940  DOA: 04/19/2023     0 PCP: Shelva Majestic, MD  Initial CC: AMS  Hospital Course: Walter [name] is an 82 yo male with PMH chronic dementia, HTN, HLD, DM II, OSA on CPAP, BPH, PTSD/anxiety/depression who presented with altered mentation.  Note, patient recently admitted from 11/29 until 12/1.  Admitted with altered mental status in the setting of sepsis secondary to aspiration pneumonia at the bilateral bases.  AMS also believed to have a delirium component in the setting of chronic dementia.  Was treated with antibiotics with improvement.  On discharge patient was alert and oriented to self only.  Plan for outpatient neurology follow-up.   Patient did follow-up with neurology who recommended continue donepezil and continued Lexapro/Seroquel for PTSD/anxiety/depression.  Further neuropsychiatric testing to be repeated in 2025.   Patient has not returned to previous baseline prior to that admission and is unclear if he will.  However for the past 2 to 3 days prior to admission, he has had significant worsening initially with increasing weakness and now significantly increased confusion with some associated agitation. His confusion appeared similar to how it was just prior to his recent admission.  He underwent workup with imaging studies in the ER.  CT was notable for acute PE involving RUL and RLL; there was also groundglass opacities in the right lower lobe concerning for infection. He was started on heparin drip, oxygen, and antibiotics and admitted for further monitoring.  Interval History:  Grossly confused resting in bed in no distress this morning.  Unable to answer collateral information questions.  Assessment and Plan: * Acute pulmonary embolism (HCC) - CT of the chest abdomen pelvis was obtained showing segmental/subsegmental pulmonary emboli of the right upper lobe and right lower lobe. - Has reportedly had decreased  activity since last admission. > Does have history of GI bleeding in 2023; continue heparin drip thru today, if tolerates, will transition to DOAC - Heparin per pharmacy - PPI daily - echo negative for heart strain   Acute metabolic encephalopathy - Believed to be secondary to infection and/or pulmonary embolism in the setting of chronic dementia - Worsening confusion on the baseline of dementia for the past couple of days now with some agitation.  Similar to previous admission earlier in the month for sepsis.  CAP (community acquired pneumonia) - GGO noted on CT - check RVP; negative covid,flu,rsv on admission  -Continue Rocephin and azithromycin for now.  PCT noted negative x 2; mild leuks, 1% bands  Dementia with behavioral disturbance (HCC) - History of some delirium and agitation during last admission. - Continue home donepezil  History of GI bleed - Was admitted in 2023 for GI bleeding.  Underwent EGD and oozing duodenal ulcer was noted and patient underwent cautery and clipping.  Has follow-up with GI outpatient and had been on PPI but has since stopped taking this. > Given this was over a year ago we will pursue anticoagulation for pulmonary embolism as above but will start with heparin and restart PPI. - Resume PPI therapy - Trend CBC  Pulmonary nodule - 5 mm right lower lobe pulmonary nodule is new in the interval. No follow-up needed if patient is low-risk. - 12 mm left lower lobe pulmonary nodule, similar to prior. This was evaluated by PET-CT 10/28/2022 and found to be non hypermetabolic.  Anxiety and depression -  Mentioned in recent neurology note but not listed on problem list.  Recommended to be addressed appropriately along with treating dementia as cognitive decline can be multifactorial. - Previously had been on Lexapro and Seroquel. - Does not appear to be taking Lexapro or Seroquel any longer  Pancreatic cyst 4.2 x 2.7 cm cystic lesion in the head of the  pancreas is similar to prior. This lesion was also not hypermetabolic on 10/28/2022 PET imaging and was characterized as stable compared back to 01/26/2022. PET-CT recommendations indicated 2 year follow-up from that exam.  Essential hypertension - Continue home regimen  Obstructive sleep apnea - Continue nightly CPAP  Hyperlipidemia associated with type 2 diabetes mellitus - Continue statin  Diabetes mellitus type II, controlled - SSI and CBG monitoring   Old records reviewed in assessment of this patient  Antimicrobials: Azithromycin 04/19/2023 >> current Rocephin 04/19/2023 >> current  DVT prophylaxis:   Heparin drip  Code Status:   Code Status: Limited: Do not attempt resuscitation (DNR) -DNR-LIMITED -Do Not Intubate/DNI   Mobility Assessment (Last 72 Hours)     Mobility Assessment     Row Name 04/20/23 1224 04/20/23 1153 04/19/23 2130       Does patient have an order for bedrest or is patient medically unstable -- -- No - Continue assessment     What is the highest level of mobility based on the progressive mobility assessment? Level 3 (Stands with assist) - Balance while standing  and cannot march in place -- Level 3 (Stands with assist) - Balance while standing  and cannot march in place     Is the above level different from baseline mobility prior to current illness? -- Yes - Recommend PT order --              Barriers to discharge: none Disposition Plan:  Home Status is: Obs  Objective: Blood pressure 121/62, pulse 65, temperature 98.3 F (36.8 C), temperature source Oral, resp. rate 15, height 6' (1.829 m), weight 81 kg, SpO2 94%.  Examination:  Physical Exam Constitutional:      General: He is not in acute distress. HENT:     Head: Normocephalic and atraumatic.     Mouth/Throat:     Mouth: Mucous membranes are moist.  Eyes:     Extraocular Movements: Extraocular movements intact.  Cardiovascular:     Rate and Rhythm: Normal rate and regular  rhythm.  Pulmonary:     Effort: Pulmonary effort is normal. No respiratory distress.     Breath sounds: Normal breath sounds. No wheezing.  Abdominal:     General: Bowel sounds are normal. There is no distension.     Palpations: Abdomen is soft.     Tenderness: There is no abdominal tenderness.  Musculoskeletal:        General: Normal range of motion.     Cervical back: Normal range of motion and neck supple.  Skin:    General: Skin is warm and dry.  Neurological:     Mental Status: He is disoriented.      Consultants:    Procedures:    Data Reviewed: Results for orders placed or performed during the hospital encounter of 04/19/23 (from the past 24 hours)  Culture, blood (routine x 2)     Status: None (Preliminary result)   Collection Time: 04/19/23 12:51 PM   Specimen: BLOOD LEFT ARM  Result Value Ref Range   Specimen Description BLOOD LEFT ARM    Special Requests      BOTTLES DRAWN AEROBIC AND ANAEROBIC Blood Culture adequate volume  Culture      NO GROWTH < 24 HOURS Performed at [facility], [address], [place], [place] [address]    Report Status PENDING   Comprehensive metabolic panel     Status: Abnormal   Collection Time: 04/19/23 12:52 PM  Result Value Ref Range   Sodium 137 135 - 145 mmol/L   Potassium 4.7 3.5 - 5.1 mmol/L   Chloride 98 98 - 111 mmol/L   CO2 26 22 - 32 mmol/L   Glucose, Bld 206 (H) 70 - 99 mg/dL   BUN 13 8 - 23 mg/dL   Creatinine, Ser 6.04 0.61 - 1.24 mg/dL   Calcium 9.2 8.9 - 54.0 mg/dL   Total Protein 6.8 6.5 - 8.1 g/dL   Albumin 3.3 (L) 3.5 - 5.0 g/dL   AST 23 15 - 41 U/L   ALT 35 0 - 44 U/L   Alkaline Phosphatase 61 38 - 126 U/L   Total Bilirubin 0.8 <1.2 mg/dL   GFR, Estimated >98 >11 mL/min   Anion gap 13 5 - 15  CBC with Differential     Status: Abnormal   Collection Time: 04/19/23 12:52 PM  Result Value Ref Range   WBC 10.7 (H) 4.0 - 10.5 K/uL   RBC 4.23 4.22 - 5.81 MIL/uL   Hemoglobin 12.9 (L) 13.0 - 17.0 g/dL    HCT 91.4 (L) 78.2 - 52.0 %   MCV 91.3 80.0 - 100.0 fL   MCH 30.5 26.0 - 34.0 pg   MCHC 33.4 30.0 - 36.0 g/dL   RDW 95.6 21.3 - 08.6 %   Platelets 268 150 - 400 K/uL   nRBC 0.0 0.0 - 0.2 %   Neutrophils Relative % 92 %   Neutro Abs 9.9 (H) 1.7 - 7.7 K/uL   Lymphocytes Relative 3 %   Lymphs Abs 0.3 (L) 0.7 - 4.0 K/uL   Monocytes Relative 4 %   Monocytes Absolute 0.4 0.1 - 1.0 K/uL   Eosinophils Relative 0 %   Eosinophils Absolute 0.0 0.0 - 0.5 K/uL   Basophils Relative 0 %   Basophils Absolute 0.0 0.0 - 0.1 K/uL   Immature Granulocytes 1 %   Abs Immature Granulocytes 0.08 (H) 0.00 - 0.07 K/uL  Lactic acid, plasma     Status: None   Collection Time: 04/19/23 12:52 PM  Result Value Ref Range   Lactic Acid, Venous 1.5 0.5 - 1.9 mmol/L  Resp panel by RT-PCR (RSV, Flu A&B, Covid) Urine, Catheterized     Status: None   Collection Time: 04/19/23 12:52 PM   Specimen: Urine, Catheterized; Nasal Swab  Result Value Ref Range   SARS Coronavirus 2 by RT PCR NEGATIVE NEGATIVE   Influenza A by PCR NEGATIVE NEGATIVE   Influenza B by PCR NEGATIVE NEGATIVE   Resp Syncytial Virus by PCR NEGATIVE NEGATIVE  Urinalysis, Routine w reflex microscopic -Urine, Catheterized     Status: Abnormal   Collection Time: 04/19/23  1:05 PM  Result Value Ref Range   Color, Urine YELLOW YELLOW   APPearance CLEAR CLEAR   Specific Gravity, Urine 1.019 1.005 - 1.030   pH 7.0 5.0 - 8.0   Glucose, UA NEGATIVE NEGATIVE mg/dL   Hgb urine dipstick NEGATIVE NEGATIVE   Bilirubin Urine NEGATIVE NEGATIVE   Ketones, ur 20 (A) NEGATIVE mg/dL   Protein, ur NEGATIVE NEGATIVE mg/dL   Nitrite NEGATIVE NEGATIVE   Leukocytes,Ua NEGATIVE NEGATIVE  Brain natriuretic peptide     Status: None  Collection Time: 04/19/23  4:56 PM  Result Value Ref Range   B Natriuretic Peptide 82.9 0.0 - 100.0 pg/mL  Troponin I (High Sensitivity)     Status: None   Collection Time: 04/19/23  4:56 PM  Result Value Ref Range   Troponin I (High  Sensitivity) 6 <18 ng/L  Procalcitonin     Status: None   Collection Time: 04/19/23  4:56 PM  Result Value Ref Range   Procalcitonin <0.10 ng/mL  CBG monitoring, ED     Status: Abnormal   Collection Time: 04/19/23  8:36 PM  Result Value Ref Range   Glucose-Capillary 189 (H) 70 - 99 mg/dL   Comment 1 Notify RN    Comment 2 Document in Chart   Troponin I (High Sensitivity)     Status: None   Collection Time: 04/19/23  9:45 PM  Result Value Ref Range   Troponin I (High Sensitivity) 7 <18 ng/L  Comprehensive metabolic panel     Status: Abnormal   Collection Time: 04/20/23  4:08 AM  Result Value Ref Range   Sodium 135 135 - 145 mmol/L   Potassium 3.5 3.5 - 5.1 mmol/L   Chloride 101 98 - 111 mmol/L   CO2 26 22 - 32 mmol/L   Glucose, Bld 129 (H) 70 - 99 mg/dL   BUN 11 8 - 23 mg/dL   Creatinine, Ser 1.61 0.61 - 1.24 mg/dL   Calcium 8.5 (L) 8.9 - 10.3 mg/dL   Total Protein 5.8 (L) 6.5 - 8.1 g/dL   Albumin 2.8 (L) 3.5 - 5.0 g/dL   AST 14 (L) 15 - 41 U/L   ALT 26 0 - 44 U/L   Alkaline Phosphatase 49 38 - 126 U/L   Total Bilirubin 0.8 <1.2 mg/dL   GFR, Estimated >09 >60 mL/min   Anion gap 8 5 - 15  CBC     Status: Abnormal   Collection Time: 04/20/23  4:08 AM  Result Value Ref Range   WBC 13.2 (H) 4.0 - 10.5 K/uL   RBC 3.76 (L) 4.22 - 5.81 MIL/uL   Hemoglobin 11.8 (L) 13.0 - 17.0 g/dL   HCT 45.4 (L) 09.8 - 11.9 %   MCV 91.2 80.0 - 100.0 fL   MCH 31.4 26.0 - 34.0 pg   MCHC 34.4 30.0 - 36.0 g/dL   RDW 14.7 82.9 - 56.2 %   Platelets 255 150 - 400 K/uL   nRBC 0.0 0.0 - 0.2 %  Procalcitonin     Status: None   Collection Time: 04/20/23  4:08 AM  Result Value Ref Range   Procalcitonin <0.10 ng/mL  Heparin level (unfractionated)     Status: Abnormal   Collection Time: 04/20/23  4:08 AM  Result Value Ref Range   Heparin Unfractionated 0.10 (L) 0.30 - 0.70 IU/mL  Glucose, capillary     Status: Abnormal   Collection Time: 04/20/23 12:13 PM  Result Value Ref Range    Glucose-Capillary 151 (H) 70 - 99 mg/dL    I have reviewed pertinent nursing notes, vitals, labs, and images as necessary. I have ordered labwork to follow up on as indicated.  I have reviewed the last notes from staff over past 24 hours. I have discussed patient's care plan and test results with nursing staff, CM/SW, and other staff as appropriate.  Time spent: Greater than 50% of the 55 minute visit was spent in counseling/coordination of care for the patient as laid out in the A&P.   LOS: 0  days   Lewie Chamber, MD [facility] 04/20/2023, 12:44 PM

## 2023-04-20 NOTE — Telephone Encounter (Signed)
Pharmacy Patient Advocate Encounter  Insurance verification completed.    The patient is insured through [facility]. Patient has Medicare and is not eligible for a copay card, but may be able to apply for patient assistance, if available.    Ran test claim for Eliquis and the current 30 day co-pay is $144.63.  Ran test claim for Xarelto and the current 30 day co-pay is $138.60.  This test claim was processed through [facility]- copay amounts may vary at other pharmacies due to pharmacy/plan contracts, or as the patient moves through the different stages of their insurance plan.

## 2023-04-20 NOTE — Progress Notes (Signed)
PHARMACY - ANTICOAGULATION CONSULT NOTE  Pharmacy Consult for heparin Indication: pulmonary embolus  Allergies  Allergen Reactions   Bactrim [Sulfamethoxazole-Trimethoprim] Nausea And Vomiting    Pt has chills and fevers also.    Patient Measurements: Height: 6' (182.9 cm) Weight: 81 kg (178 lb 9.2 oz) IBW/kg (Calculated) : 77.6 Heparin Dosing Weight: TBW  Vital Signs: Temp: 98.3 F (36.8 C) (12/19 0434) Temp Source: Oral (12/19 0434) BP: 121/62 (12/19 0434) Pulse Rate: 65 (12/18 2123)  Labs: Recent Labs    04/19/23 1252 04/19/23 1656 04/19/23 2145 04/20/23 0408  HGB 12.9*  --   --  11.8*  HCT 38.6*  --   --  34.3*  PLT 268  --   --  255  HEPARINUNFRC  --   --   --  0.10*  CREATININE 0.93  --   --  0.84  TROPONINIHS  --  6 7  --     Estimated Creatinine Clearance: 74.4 mL/min (by C-G formula based on SCr of 0.84 mg/dL).   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 All started after a hemorrhoid surgery and later with urinary retention. Patient at one point was septic from urinary issues.     Diabetes mellitus type II, controlled 04/23/2007    Metformin 1g BID, amaryl 4mg --> 8mg , had to add [place] back  Never took victoza.   Januvia-lethargic and dizzy in past. 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 Follows with Dr. 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 CT abd/pelvis showed acute PE. Pharmacy consulted to begin IV heparin. No AC PTA noted. No bleeding noted but does have a hx GIB (2023), CBC is stable since 12/10. Renal function is normal.   Heparin level subtherapeutic at 0.1 on 1300 units/hr. Hgb 11.8, plt wnl. No signs of bleeding noted and no issues with infusion per RN.   Goal of Therapy:  Heparin level 0.3-0.7 units/ml Monitor platelets by anticoagulation protocol: Yes   Plan:  Heparin  2000 units x 1 bolus  Increase heparin to 1500 units/hr  8 hr heparin level Daily heparin level and CBC Monitor for signs/symptoms of bleeding   Thank you for involving pharmacy in the patient's care.   Theotis Burrow, PharmD PGY1 Acute Care Pharmacy Resident  04/20/2023 7:49 AM

## 2023-04-20 NOTE — Assessment & Plan Note (Signed)
-   Was admitted in 2023 for GI bleeding.  Underwent EGD and oozing duodenal ulcer was noted and patient underwent cautery and clipping.  Has follow-up with GI outpatient and had been on PPI but has since stopped taking this. > Given this was over a year ago we will pursue anticoagulation for pulmonary embolism as above but will start with heparin and restart PPI. - Resume PPI therapy - Trend CBC

## 2023-04-20 NOTE — Assessment & Plan Note (Signed)
4.2 x 2.7 cm cystic lesion in the head of the pancreas is similar to prior. This lesion was also not hypermetabolic on 10/28/2022 PET imaging and was characterized as stable compared back to 01/26/2022. PET-CT recommendations indicated 2 year follow-up from that exam.

## 2023-04-20 NOTE — Assessment & Plan Note (Addendum)
-   Believed to be secondary to infection and/or pulmonary embolism in the setting of chronic presumed diagnosis of cognitive impairment/dementia - similar to previous admission earlier in the month for sepsis.

## 2023-04-20 NOTE — Progress Notes (Signed)
Lower ext venous duplex  has been completed. Refer to [facility] under chart review to view preliminary results.   04/20/2023  3:08 PM Walter [name], Gerarda Gunther

## 2023-04-20 NOTE — Assessment & Plan Note (Addendum)
-   has suspected diagnosis of dementia and if following outpatient with neurology for ongoing workup - History of some delirium and agitation during last admission as well; so far remains cooperative  - Continue home donepezil

## 2023-04-20 NOTE — Assessment & Plan Note (Signed)
Continue statin. 

## 2023-04-20 NOTE — [place] FL2 (Signed)
[place] MEDICAID FL2 LEVEL OF CARE FORM     IDENTIFICATION  Patient Name: Walter [name] Birthdate: [birth year] Sex: male Admission Date (Current Location): 04/19/2023  [facility] and [place] Number:  Producer, television/film/video and Address:  [facility] [place]. [facility], [address], [place], [place] [address]      Provider Number: [id number]  Attending Physician Name and Address:  Lewie Chamber, MD  Relative Name and Phone Number:  Mervyn Gay [facility]) [phone]    Current Level of Care: Hospital Recommended Level of Care: Skilled Nursing Facility Prior Approval Number:    Date Approved/Denied:   PASRR Number: [id number]  Discharge Plan: SNF    Current Diagnoses: Patient Active Problem List   Diagnosis Date Noted   History of GI bleed 04/20/2023   Anxiety and depression 04/20/2023   Acute pulmonary embolism (HCC) 04/19/2023   Acute encephalopathy 04/19/2023   Dementia with behavioral disturbance (HCC) 04/01/2023   Acute metabolic encephalopathy 03/31/2023   Thyroid nodule 03/31/2023   Lactic acidosis 03/31/2023   History of dysplastic nevus    History of skin cancer    Pulmonary nodule 02/04/2022   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   Allergic rhinitis 08/19/2019   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   BPH (benign prostatic hyperplasia) 07/02/2015   CAP (community acquired pneumonia) 05/19/2015   Hallux valgus of right foot 03/26/2014   History of colon cancer 01/27/2014   Iritis 12/17/2008   Postherpetic neuralgia 05/22/2008   Bigeminy 04/21/2008   Diabetes mellitus type II, controlled 04/23/2007   Hyperlipidemia associated with type 2 diabetes mellitus 04/23/2007   Obstructive sleep apnea 04/23/2007   Essential hypertension 04/23/2007    Orientation RESPIRATION BLADDER Height & Weight     Self  Normal Continent, External catheter (External Urinary Catheter)  Weight: 81 kg Height:  6' (182.9 cm)  BEHAVIORAL SYMPTOMS/MOOD NEUROLOGICAL BOWEL NUTRITION STATUS      Continent (WDL) Diet (Please see discharge summary)  AMBULATORY STATUS COMMUNICATION OF NEEDS Skin   Limited Assist Verbally Other (Comment) (Erythema,abdomen,arm,Bil.,Wound/Incision LDAs,PI,thigh,R,posterior stage 3,PRN,Wound/Incision LDAs)                       Personal Care Assistance Level of Assistance  Bathing, Feeding, Dressing Bathing Assistance: Limited assistance Feeding assistance: Limited assistance Dressing Assistance: Limited assistance     Functional Limitations Info  Sight, Hearing, Speech Sight Info: Adequate Hearing Info: Impaired Speech Info: Adequate    SPECIAL CARE FACTORS FREQUENCY  PT (By licensed PT), OT (By licensed OT)     PT Frequency: 5x min weekly OT Frequency: 5x  min weekly            Contractures Contractures Info: Not present    Additional Factors Info  Code Status, Allergies, Insulin Sliding Scale Code Status Info: DNR Allergies Info: Bactrim (sulfamethoxazole-trimethoprim)   Insulin Sliding Scale Info: insulin aspart (novoLOG) injection 0-9 Units 3 times daily with meals       Current Medications (04/20/2023):  This is the current hospital active medication list Current Facility-Administered Medications  Medication Dose Route Frequency Provider Last Rate Last Admin   acetaminophen (TYLENOL) tablet 650 mg  650 mg Oral Q6H PRN Synetta Fail, MD       Or   acetaminophen (TYLENOL) suppository 650 mg  650 mg Rectal Q6H PRN Synetta Fail, MD       amLODipine (NORVASC) tablet 5 mg  5 mg Oral Daily Synetta Fail, MD   5 mg at 04/20/23 1032   azithromycin (ZITHROMAX) 500 mg in sodium chloride 0.9 % 250 mL IVPB  500 mg Intravenous Q24H Doristine Counter, [place]   Stopped at 04/19/23 2341   cefTRIAXone (ROCEPHIN) 2 g in sodium chloride 0.9 % 100 mL IVPB  2 g Intravenous Q24H Synetta Fail, MD   Stopped at 04/20/23  0025   donepezil (ARICEPT) tablet 10 mg  10 mg Oral QHS Synetta Fail, MD   10 mg at 04/19/23 2230   heparin ADULT infusion 100 units/mL (25000 units/216mL)  1,500 Units/hr Intravenous Continuous Theotis Burrow I, RPH 15 mL/hr at 04/20/23 0841 1,500 Units/hr at 04/20/23 0841   insulin aspart (novoLOG) injection 0-9 Units  0-9 Units Subcutaneous TID WC Synetta Fail, MD   2 Units at 04/20/23 1242   irbesartan (AVAPRO) tablet 300 mg  300 mg Oral Daily Synetta Fail, MD   300 mg at 04/20/23 1032   pantoprazole (PROTONIX) EC tablet 40 mg  40 mg Oral Daily Synetta Fail, MD   40 mg at 04/20/23 1036   polyethylene glycol (MIRALAX / GLYCOLAX) packet 17 g  17 g Oral Daily PRN Synetta Fail, MD       rosuvastatin (CRESTOR) tablet 20 mg  20 mg Oral Daily Synetta Fail, MD   20 mg at 04/20/23 1031   sodium chloride flush (NS) 0.9 % injection 3 mL  3 mL Intravenous Q12H Synetta Fail, MD   3 mL at 04/20/23 1037     Discharge Medications: Please see discharge summary for a list of discharge medications.  Relevant Imaging Results:  Relevant Lab Results:   Additional Information [id number]  Lockie Pares, RN

## 2023-04-20 NOTE — Progress Notes (Signed)
Pt noncompliant in wearing C-PAP, pt placed on 2L [place] supplemental O2 while sleeping.  Bari Edward, RN

## 2023-04-20 NOTE — Assessment & Plan Note (Signed)
-   Continue home regimen 

## 2023-04-20 NOTE — Evaluation (Addendum)
Occupational Therapy Evaluation Patient Details Name: Walter [name] MRN: [id number] DOB: 07-Sep-1940 Today's Date: 04/20/2023   History of Present Illness Mathis Neault is a 82 y.o. male who presents with altered mental status; with medical history significant of mild cognitive impairment, hyperlipidemia, diabetes type 2, hypertension, R toe amputations   Clinical Impression   Cleared by MD for therapy evals. PTA, pt lives with family, typically ambulatory but unsure of ADL abilities d/t pt AMS. Pt presents now with deficits in cognition, standing balance, endurance and strength. Pt requires consistent hands on assist for sitting balance, standing balance and transfers with up to +2 people needed to safely mobilize. Pt requires Min A for UB ADL and Mod-Max A (x 2 if in standing) for LB ADLs d/t deficits. Patient will benefit from continued inpatient follow up therapy, <3 hours/day at DC unless family able to provide the physical assistance pt currently requires.        If plan is discharge home, recommend the following: A lot of help with walking and/or transfers;A lot of help with bathing/dressing/bathroom    Functional Status Assessment  Patient has had a recent decline in their functional status and demonstrates the ability to make significant improvements in function in a reasonable and predictable amount of time.  Equipment Recommendations  Wheelchair cushion (measurements OT);Wheelchair (measurements OT) (if does not already have)    Recommendations for Other Services       Precautions / Restrictions Precautions Precautions: Fall Precaution Comments: R toe amputations impacting balance; consider using his shoes Restrictions Weight Bearing Restrictions Per Provider Order: No      Mobility Bed Mobility Overal bed mobility: Needs Assistance Bed Mobility: Supine to Sit, Sit to Supine     Supine to sit: HOB elevated, Min assist, Used rails Sit to supine: Min assist, HOB elevated, Used  rails   General bed mobility comments: mulimodal cues to initiate supine>sit EOB. Min assist to guide raising trunk. pt with posterior lean sitting EOB and required frequent/constant cues to engage core for anterior weight shift.    Transfers Overall transfer level: Needs assistance Equipment used: Rolling walker (2 wheels) Transfers: Sit to/from Stand Sit to Stand: Min assist, +2 safety/equipment           General transfer comment: Min assist for power up/safety. pt using bil UE to initiate rise and hand over hand assist to guide hands onto walker. pt stable in static standing.      Balance Overall balance assessment: Needs assistance Sitting-balance support: Feet supported Sitting balance-Leahy Scale: Poor Sitting balance - Comments: posterior lean at EOB, mod assist to maintain upright balance. seated balance improved after initial sit<>stand with min assist to stabilize Postural control: Posterior lean, Right lateral lean   Standing balance-Leahy Scale: Poor Standing balance comment: reliant on external support                           ADL either performed or assessed with clinical judgement   ADL Overall ADL's : Needs assistance/impaired Eating/Feeding: Set up   Grooming: Supervision/safety;Sitting;Wash/dry face Grooming Details (indicate cue type and reason): pt reported preference to sit at sink to perform task. Upper Body Bathing: Minimal assistance;Sitting   Lower Body Bathing: Sitting/lateral leans;Sit to/from stand;+2 for safety/equipment;Moderate assistance;+2 for physical assistance   Upper Body Dressing : Minimal assistance;Sitting   Lower Body Dressing: Maximal assistance;+2 for safety/equipment;Sitting/lateral leans;Sit to/from stand   Toilet Transfer: Moderate assistance;+2 for safety/equipment;+2 for physical assistance;Stand-pivot;BSC/3in1;Rolling walker (2  wheels) Toilet Transfer Details (indicate cue type and reason): simulated in turning  back to bed with RW- poor DME mgmt, max cues and hands on assist to turn pt back to bed Toileting- Clothing Manipulation and Hygiene: Maximal assistance;+2 for physical assistance;+2 for safety/equipment;Sitting/lateral lean;Sit to/from stand         General ADL Comments: Limited by posterior bias EOB, poor DME mgmt/sequencing tasks in standing and impaired cognition     Vision Ability to See in Adequate Light: 0 Adequate Patient Visual Report: No change from baseline Vision Assessment?: No apparent visual deficits     Perception         Praxis         Pertinent Vitals/Pain Pain Assessment Faces Pain Scale: Hurts a little bit Pain Location: general body aches Pain Descriptors / Indicators: Aching     Extremity/Trunk Assessment Upper Extremity Assessment Upper Extremity Assessment: Overall WFL for tasks assessed;Right hand dominant   Lower Extremity Assessment Lower Extremity Assessment: Defer to PT evaluation   Cervical / Trunk Assessment Cervical / Trunk Assessment: Normal   Communication Communication Communication: No apparent difficulties   Cognition Arousal: Alert Behavior During Therapy: WFL for tasks assessed/performed Overall Cognitive Status: History of cognitive impairments - at baseline                                 General Comments: hx of dementia, aware that he is not at home but poor recall of birthdate, year he got married and tangential conversation at times. reports being born [place]. consistent cues for sequencing, safety with DME use and awareness of balance deficits/correction     General Comments       Exercises     Shoulder Instructions      Home Living Family/patient expects to be discharged to:: Private residence Living Arrangements: Spouse/significant other;Children (daughters comes over PRN) Available Help at Discharge: Family;Available PRN/intermittently Type of Home: House Home Access: Stairs to  enter [employer] of Steps: 2   Home Layout: Two level Alternate Level Stairs-Number of Steps: chairlift   Bathroom Shower/Tub: Tub/shower unit             Additional Comments: pt questionable historian; no answer from spouse when attempted to call.      Prior Functioning/Environment Prior Level of Function : Independent/Modified Independent;Patient poor historian/Family not available             Mobility Comments: ind ADLs Comments: reports ind        OT Problem List: Decreased strength;Decreased range of motion;Decreased activity tolerance;Impaired balance (sitting and/or standing);Decreased cognition;Decreased coordination;Decreased safety awareness      OT Treatment/Interventions: Self-care/ADL training;Therapeutic exercise;DME and/or AE instruction;Energy conservation;Therapeutic activities;Patient/family education;Balance training;Cognitive remediation/compensation    OT Goals(Current goals can be found in the care plan section) Acute Rehab OT Goals Patient Stated Goal: go home OT Goal Formulation: With patient Time For Goal Achievement: 05/04/23 Potential to Achieve Goals: Fair ADL Goals Pt Will Perform Grooming: with contact guard assist;standing Pt Will Perform Upper Body Bathing: with supervision;sitting Pt Will Perform Lower Body Bathing: with min assist;sitting/lateral leans;sit to/from stand Pt Will Transfer to Toilet: with min assist;ambulating  OT Frequency: Min 1X/week    Co-evaluation PT/OT/SLP Co-Evaluation/Treatment: Yes Reason for Co-Treatment: For patient/therapist safety;To address functional/ADL transfers;Necessary to address cognition/behavior during functional activity PT goals addressed during session: Mobility/safety with mobility;Balance;Proper use of DME OT goals addressed during session: ADL's and self-care  AM-PAC OT "6 Clicks" Daily Activity     Outcome Measure Help from another person eating meals?: A Little Help  from another person taking care of personal grooming?: A Little Help from another person toileting, which includes using toliet, bedpan, or urinal?: A Little Help from another person bathing (including washing, rinsing, drying)?: A Little Help from another person to put on and taking off regular upper body clothing?: A Little Help from another person to put on and taking off regular lower body clothing?: A Little 6 Click Score: 18   End of Session Equipment Utilized During Treatment: Rolling walker (2 wheels);Gait belt Nurse Communication: Mobility status  Activity Tolerance: Patient tolerated treatment well Patient left: in bed;with call bell/phone within reach;with bed alarm set  OT Visit Diagnosis: Unsteadiness on feet (R26.81);Other abnormalities of gait and mobility (R26.89);Muscle weakness (generalized) (M62.81)                Time: 1610-9604 OT Time Calculation (min): 23 min Charges:  OT General Charges $OT Visit: 1 Visit OT Evaluation $OT Eval Moderate Complexity: 1 Mod  Bradd Canary, OTR/L Acute Rehab Services Office: [phone]   Lorre Munroe 04/20/2023, 2:36 PM

## 2023-04-21 ENCOUNTER — Encounter: Payer: Self-pay | Admitting: Family Medicine

## 2023-04-21 DIAGNOSIS — K862 Cyst of pancreas: Secondary | ICD-10-CM | POA: Diagnosis present

## 2023-04-21 DIAGNOSIS — R29810 Facial weakness: Secondary | ICD-10-CM | POA: Diagnosis present

## 2023-04-21 DIAGNOSIS — N400 Enlarged prostate without lower urinary tract symptoms: Secondary | ICD-10-CM | POA: Diagnosis present

## 2023-04-21 DIAGNOSIS — R410 Disorientation, unspecified: Secondary | ICD-10-CM | POA: Diagnosis not present

## 2023-04-21 DIAGNOSIS — I2699 Other pulmonary embolism without acute cor pulmonale: Secondary | ICD-10-CM | POA: Diagnosis not present

## 2023-04-21 DIAGNOSIS — R4189 Other symptoms and signs involving cognitive functions and awareness: Secondary | ICD-10-CM | POA: Diagnosis not present

## 2023-04-21 DIAGNOSIS — F03918 Unspecified dementia, unspecified severity, with other behavioral disturbance: Secondary | ICD-10-CM | POA: Diagnosis present

## 2023-04-21 DIAGNOSIS — F0393 Unspecified dementia, unspecified severity, with mood disturbance: Secondary | ICD-10-CM | POA: Diagnosis present

## 2023-04-21 DIAGNOSIS — S0181XA Laceration without foreign body of other part of head, initial encounter: Secondary | ICD-10-CM | POA: Diagnosis not present

## 2023-04-21 DIAGNOSIS — F4310 Post-traumatic stress disorder, unspecified: Secondary | ICD-10-CM | POA: Diagnosis present

## 2023-04-21 DIAGNOSIS — R911 Solitary pulmonary nodule: Secondary | ICD-10-CM | POA: Diagnosis present

## 2023-04-21 DIAGNOSIS — Z87891 Personal history of nicotine dependence: Secondary | ICD-10-CM | POA: Diagnosis not present

## 2023-04-21 DIAGNOSIS — W1839XA Other fall on same level, initial encounter: Secondary | ICD-10-CM | POA: Diagnosis present

## 2023-04-21 DIAGNOSIS — Y92230 Patient room in hospital as the place of occurrence of the external cause: Secondary | ICD-10-CM | POA: Diagnosis present

## 2023-04-21 DIAGNOSIS — R32 Unspecified urinary incontinence: Secondary | ICD-10-CM | POA: Diagnosis not present

## 2023-04-21 DIAGNOSIS — F32A Depression, unspecified: Secondary | ICD-10-CM | POA: Diagnosis present

## 2023-04-21 DIAGNOSIS — G4733 Obstructive sleep apnea (adult) (pediatric): Secondary | ICD-10-CM | POA: Diagnosis present

## 2023-04-21 DIAGNOSIS — I9589 Other hypotension: Secondary | ICD-10-CM | POA: Diagnosis not present

## 2023-04-21 DIAGNOSIS — I2693 Single subsegmental pulmonary embolism without acute cor pulmonale: Secondary | ICD-10-CM | POA: Diagnosis present

## 2023-04-21 DIAGNOSIS — Z1152 Encounter for screening for COVID-19: Secondary | ICD-10-CM | POA: Diagnosis not present

## 2023-04-21 DIAGNOSIS — Z66 Do not resuscitate: Secondary | ICD-10-CM | POA: Diagnosis present

## 2023-04-21 DIAGNOSIS — I10 Essential (primary) hypertension: Secondary | ICD-10-CM | POA: Diagnosis present

## 2023-04-21 DIAGNOSIS — G9341 Metabolic encephalopathy: Secondary | ICD-10-CM | POA: Diagnosis present

## 2023-04-21 DIAGNOSIS — Z79899 Other long term (current) drug therapy: Secondary | ICD-10-CM | POA: Diagnosis not present

## 2023-04-21 DIAGNOSIS — Z8719 Personal history of other diseases of the digestive system: Secondary | ICD-10-CM | POA: Diagnosis not present

## 2023-04-21 DIAGNOSIS — E1169 Type 2 diabetes mellitus with other specified complication: Secondary | ICD-10-CM | POA: Diagnosis present

## 2023-04-21 DIAGNOSIS — S51811A Laceration without foreign body of right forearm, initial encounter: Secondary | ICD-10-CM | POA: Diagnosis not present

## 2023-04-21 DIAGNOSIS — J189 Pneumonia, unspecified organism: Secondary | ICD-10-CM | POA: Diagnosis present

## 2023-04-21 DIAGNOSIS — R4182 Altered mental status, unspecified: Secondary | ICD-10-CM | POA: Diagnosis present

## 2023-04-21 DIAGNOSIS — Z85828 Personal history of other malignant neoplasm of skin: Secondary | ICD-10-CM | POA: Diagnosis not present

## 2023-04-21 DIAGNOSIS — F0394 Unspecified dementia, unspecified severity, with anxiety: Secondary | ICD-10-CM | POA: Diagnosis present

## 2023-04-21 DIAGNOSIS — E785 Hyperlipidemia, unspecified: Secondary | ICD-10-CM | POA: Diagnosis present

## 2023-04-21 LAB — CBC WITH DIFFERENTIAL/PLATELET
Abs Immature Granulocytes: 0.08 10*3/uL — ABNORMAL HIGH (ref 0.00–0.07)
Basophils Absolute: 0 10*3/uL (ref 0.0–0.1)
Basophils Relative: 0 %
Eosinophils Absolute: 0.1 10*3/uL (ref 0.0–0.5)
Eosinophils Relative: 1 %
HCT: 35.1 % — ABNORMAL LOW (ref 39.0–52.0)
Hemoglobin: 12.4 g/dL — ABNORMAL LOW (ref 13.0–17.0)
Immature Granulocytes: 1 %
Lymphocytes Relative: 10 %
Lymphs Abs: 0.8 10*3/uL (ref 0.7–4.0)
MCH: 31.3 pg (ref 26.0–34.0)
MCHC: 35.3 g/dL (ref 30.0–36.0)
MCV: 88.6 fL (ref 80.0–100.0)
Monocytes Absolute: 0.8 10*3/uL (ref 0.1–1.0)
Monocytes Relative: 9 %
Neutro Abs: 6.5 10*3/uL (ref 1.7–7.7)
Neutrophils Relative %: 79 %
Platelets: 227 10*3/uL (ref 150–400)
RBC: 3.96 MIL/uL — ABNORMAL LOW (ref 4.22–5.81)
RDW: 12.5 % (ref 11.5–15.5)
WBC: 8.3 10*3/uL (ref 4.0–10.5)
nRBC: 0 % (ref 0.0–0.2)

## 2023-04-21 LAB — MAGNESIUM: Magnesium: 1.7 mg/dL (ref 1.7–2.4)

## 2023-04-21 LAB — GLUCOSE, CAPILLARY
Glucose-Capillary: 157 mg/dL — ABNORMAL HIGH (ref 70–99)
Glucose-Capillary: 206 mg/dL — ABNORMAL HIGH (ref 70–99)
Glucose-Capillary: 174 mg/dL — ABNORMAL HIGH (ref 70–99)
Glucose-Capillary: 130 mg/dL — ABNORMAL HIGH (ref 70–99)

## 2023-04-21 LAB — BASIC METABOLIC PANEL
Anion gap: 9 (ref 5–15)
BUN: 6 mg/dL — ABNORMAL LOW (ref 8–23)
CO2: 26 mmol/L (ref 22–32)
Calcium: 8.7 mg/dL — ABNORMAL LOW (ref 8.9–10.3)
Chloride: 101 mmol/L (ref 98–111)
Creatinine, Ser: 0.82 mg/dL (ref 0.61–1.24)
GFR, Estimated: >60 mL/min (ref >60)
Glucose, Bld: 174 mg/dL — ABNORMAL HIGH (ref 70–99)
Potassium: 3.8 mmol/L (ref 3.5–5.1)
Sodium: 136 mmol/L (ref 135–145)

## 2023-04-21 LAB — HEPARIN LEVEL (UNFRACTIONATED): Heparin Unfractionated: 0.51 [IU]/mL (ref 0.30–0.70)

## 2023-04-21 MED ORDER — APIXABAN 5 MG PO TABS
10.0000 mg | ORAL_TABLET | Freq: Two times a day (BID) | ORAL | Status: AC
  Administered 2023-04-21 – 2023-04-27 (×14): 10 mg via ORAL
  Filled 2023-04-21 (×14): qty 2

## 2023-04-21 MED ORDER — STROKE: EARLY STAGES OF RECOVERY BOOK: Freq: Once | Status: DC

## 2023-04-21 MED ORDER — APIXABAN 5 MG PO TABS
5.0000 mg | ORAL_TABLET | Freq: Two times a day (BID) | ORAL | Status: DC
  Administered 2023-04-28 – 2023-05-03 (×11): 5 mg via ORAL
  Filled 2023-04-21 (×11): qty 1

## 2023-04-21 MED ORDER — AZITHROMYCIN 250 MG PO TABS
500.0000 mg | ORAL_TABLET | Freq: Every day | ORAL | Status: AC
  Administered 2023-04-21 – 2023-04-23 (×3): 500 mg via ORAL
  Filled 2023-04-21 (×3): qty 2

## 2023-04-21 MED ORDER — FERROUS SULFATE 325 (65 FE) MG PO TABS
325.0000 mg | ORAL_TABLET | ORAL | Status: DC
  Administered 2023-04-21 – 2023-05-03 (×7): 325 mg via ORAL
  Filled 2023-04-21 (×10): qty 1

## 2023-04-21 NOTE — Progress Notes (Signed)
Physical Therapy Treatment Patient Details Name: Walter [name] MRN: [id number] DOB: April 22, 1941 Today's Date: 04/21/2023   History of Present Illness Patient is an 82 y.o. male presented to ED with AMS. Pt recently admitted 11/29-12/1 for AMS due to sepsis secondary to PNA. CT of the chest abdomen and pelvis with contrast demonstrated a segmental/subsegmental pulmonary emboli at right upper lobe and right lower lobe. PMH significant of hypertension, hyperlipidemia, diabetes, OSA on CPAP, BPH, aspiration, PTSD, anxiety, depression, dementia, pancreatic cyst.    PT Comments  Upon initiating session, noted pt was talking out of the R side of his mouth and had a L facial droop. Noted mild R tongue deviation and mild L leg weakness compared to his R when performing MMT as well. Notified RN and MD immediately, who both came to assess pt. They began to initiate a Code Stroke when the MD was able to get in contact with the pt's wife, who confirmed this is his baseline. The wife reported EMS had asked her the same. Code Stroke cancelled, thus returned to complete gait training and session with pt. The pt continues to lean posteriorly, resulting in him needing up to [facility] for sitting balance. He is unable to withstand a balance challenge in sitting without losing his balance posteriorly. He was able to ambulate an increased distance of up to ~80 ft but needed a RW and min-modA. He can mildly impulsive and does display unsafe tendencies to push the RW distal to him and step his L foot lateral to the RW when turning. He also is unable to recognize when he is fatiguing. He is at high risk for falls and did display x1 LOB when ambulating, needing modA to recover. Had an extended discussion via phone with wife and daughter in regards to PT recs and d/c options. They reported that the pt had been able to ambulate independently without an AD prior to admission and did confirm they could provide 24/7 care at d/c. They voiced  desire to pursue AIR at this time. Notified TOC. Considering the pt's high motivation to participate and improve, his good support at d/c, and his drastic functional decline, he could greatly benefit from intensive inpatient rehab, > 3 hours/day. Based on how he did today, I do believe he could tolerate the intensity required for this. Will continue to follow acutely.   If plan is discharge home, recommend the following: Assistance with cooking/housework;Help with stairs or ramp for entrance;Supervision due to cognitive status;A lot of help with walking and/or transfers;A lot of help with bathing/dressing/bathroom;Direct supervision/assist for medications management;Direct supervision/assist for financial management;Assist for transportation   Can travel by private vehicle        Equipment Recommendations   (TBD at next venue/with progress)    Recommendations for Other Services Rehab consult     Precautions / Restrictions Precautions Precautions: Fall Precaution Comments: R toe amputations effecting balance; consider using his shoes Restrictions Weight Bearing Restrictions Per Provider Order: No     Mobility  Bed Mobility Overal bed mobility: Needs Assistance Bed Mobility: Supine to Sit, Sit to Supine     Supine to sit: HOB elevated, Used rails, Mod assist Sit to supine: Min assist, HOB elevated, Used rails   General bed mobility comments: Pt initiated bringing legs off L EOB well but needed cues to pull on L bed rail to ascend trunk along with modA to manage his trunk when transitioning supine > sit L EOB 2x. MinA to direct his trunk to return to  supine, cuing him to lean to his L elbow, x1 rep    Transfers Overall transfer level: Needs assistance Equipment used: Rolling walker (2 wheels) Transfers: Sit to/from Stand Sit to Stand: Min assist           General transfer comment: Min assist for power up/safety. pt using bil UE to initiate rise. x1 rep from EOB and x1 rep from  recliner. extra time needed to extend trunk to stand upright    Ambulation/Gait Ambulation/Gait assistance: Min assist, Mod assist Gait Distance (Feet): 80 Feet Assistive device: Rolling walker (2 wheels) Gait Pattern/deviations: Step-through pattern, Decreased stride length, Drifts right/left, Decreased step length - left, Trunk flexed, Knee flexed in stance - right, Knee flexed in stance - left (Lt lean) Gait velocity: decr Gait velocity interpretation: <1.31 ft/sec, indicative of household ambulator   General Gait Details: Pt was mildly impulsive to initiate ambulating while therapist was arranging lines. He needed repeated cues to remain proximal to his RW and within the RW as he often would drift his L foot lateral to the RW when turning. Repeated cues provided for upright posture. As distance progressed, pt evidently fatigued, noted by his knees flexing more and seeming more unsteady. Pt denied fatigue though. x1 LOB in which modA was provided. Min-modA for balance and RW management throughout   Stairs             Wheelchair Mobility     Tilt Bed    Modified Rankin (Stroke Patients Only)       Balance Overall balance assessment: Needs assistance Sitting-balance support: Feet supported, Bilateral upper extremity supported, Single extremity supported, No upper extremity supported Sitting balance-Leahy Scale: Poor Sitting balance - Comments: Pt demonstrated a posterior lean when sitting EOB. He was able to correct it momentarily and maintain it briefly with CGA for safety, but he would begin to lean posteriorly again and lean even stronger when facing balance challenges, thereby needing min-modA for balance Postural control: Posterior lean Standing balance support: Bilateral upper extremity supported, During functional activity, Reliant on assistive device for balance Standing balance-Leahy Scale: Poor Standing balance comment: reliant on external support and min-modA for  balance, x1 LOB in which modA was provided to recover                            Cognition Arousal: Alert Behavior During Therapy: Impulsive (mild) Overall Cognitive Status: No family/caregiver present to determine baseline cognitive functioning                                 General Comments: Pt has a hx of dementia. Noted memory deficits as pt did not recall what he had for lunch and was vague in asking his questions about this facility and the therapist's job. He did not seem to know where he was exactly. Noted slow processing and poor safety awareness, not recognizing when he is fatiguing or using the RW unsafely.        Exercises      General Comments General comments (skin integrity, edema, etc.): Upon initiating session, noted pt was talking out of the R side of his mouth and had a L facial droop. Noted mild R tongue deviation and mild L leg weakness compared to his R when performing MMT as well. Notified RN and MD immediately, who both came to assess pt. They began to initiate a  Code Stroke when the MD was able to get in contact with the pt's wife, who confirmed this is his baseline. The wife reported EMS had asked her the same. Code Stroke cancelled, thus returned to complete gait training and session with pt; extended discussion via phone with wife and daughter in regards to PT recs and d/c options with them voicing desire to pursue AIR at this time, notified TOC      Pertinent Vitals/Pain Pain Assessment Pain Assessment: Faces Faces Pain Scale: Hurts a little bit Pain Location: generalized Pain Descriptors / Indicators: Discomfort, Grimacing Pain Intervention(s): Limited activity within patient's tolerance, Monitored during session, Repositioned    Home Living                          Prior Function            PT Goals (current goals can now be found in the care plan section) Acute Rehab PT Goals Patient Stated Goal: to walk PT Goal  Formulation: With patient/family Time For Goal Achievement: 05/04/23 Potential to Achieve Goals: Good Progress towards PT goals: Progressing toward goals    Frequency    Min 1X/week      PT Plan      Co-evaluation              AM-PAC PT "6 Clicks" Mobility   Outcome Measure  Help needed turning from your back to your side while in a flat bed without using bedrails?: A Little Help needed moving from lying on your back to sitting on the side of a flat bed without using bedrails?: A Lot Help needed moving to and from a bed to a chair (including a wheelchair)?: A Little Help needed standing up from a chair using your arms (e.g., wheelchair or bedside chair)?: A Little Help needed to walk in hospital room?: A Lot Help needed climbing 3-5 steps with a railing? : A Lot 6 Click Score: 15    End of Session Equipment Utilized During Treatment: Gait belt Activity Tolerance: Patient tolerated treatment well Patient left: in chair;with call bell/phone within reach;with chair alarm set;Other (comment) (fall mat in front of him and legs elevated in recliner) Nurse Communication: Mobility status;Other (comment) (L facial droop) PT Visit Diagnosis: Unsteadiness on feet (R26.81);Other abnormalities of gait and mobility (R26.89);Muscle weakness (generalized) (M62.81);Difficulty in walking, not elsewhere classified (R26.2)     Time: 6295-2841 PT Time Calculation (min) (ACUTE ONLY): 65 min  Charges:    $Gait Training: 23-37 mins $Therapeutic Activity: 23-37 mins PT General Charges $$ ACUTE PT VISIT: 1 Visit                     Virgil Benedict, PT, DPT Acute Rehabilitation Services  Office: [phone]    Bettina Gavia 04/21/2023, 2:10 PM

## 2023-04-21 NOTE — Progress Notes (Signed)
PHARMACY - ANTICOAGULATION CONSULT NOTE  Pharmacy Consult for heparin Indication: pulmonary embolus  Allergies  Allergen Reactions   Bactrim [Sulfamethoxazole-Trimethoprim] Nausea And Vomiting    Pt has chills and fevers also.    Patient Measurements: Height: 6' (182.9 cm) Weight: 78.9 kg (173 lb 15.1 oz) IBW/kg (Calculated) : 77.6 Heparin Dosing Weight: TBW  Vital Signs: Temp: 98.4 F (36.9 C) (12/20 0452) Temp Source: Oral (12/20 0452) BP: 140/66 (12/20 0858) Pulse Rate: 65 (12/20 0452)  Labs: Recent Labs    04/19/23 1252 04/19/23 1656 04/19/23 2145 04/20/23 0408 04/20/23 1553 04/21/23 0801  HGB 12.9*  --   --  11.8*  --  12.4*  HCT 38.6*  --   --  34.3*  --  35.1*  PLT 268  --   --  255  --  227  HEPARINUNFRC  --   --   --  0.10* 0.28* 0.51  CREATININE 0.93  --   --  0.84  --  0.82  TROPONINIHS  --  6 7  --   --   --     Estimated Creatinine Clearance: 76.2 mL/min (by C-G formula based on SCr of 0.82 mg/dL).  Assessment: 36 yom with dementia who presented to the ED with AMS. CT abd/pelvis showed acute PE. Pharmacy consulted to begin IV heparin. No AC PTA noted. No bleeding noted but does have a hx GIB (2023), CBC is stable since 12/10. Renal function is normal.   Heparin level therapeutic, to transition to apixaban this am. DOAC copays high due to deductible, can use 30d free card and reassess insurance copays in 2025.  Goal of Therapy:  Heparin level 0.3-0.7 units/ml Monitor platelets by anticoagulation protocol: Yes   Plan:  Apixaban 10mg  BID x7d then 5mg  BID thereafter Pharmacy will sign off, reconsult as needed  Fredonia Highland, PharmD, BCPS, [facility] Clinical Pharmacist [phone] Please check AMION for all [facility] Pharm[facility] numbers 04/21/2023

## 2023-04-21 NOTE — Plan of Care (Signed)
  Problem: Fluid Volume: Goal: Ability to maintain a balanced intake and output will improve Outcome: Progressing   Problem: Metabolic: Goal: Ability to maintain appropriate glucose levels will improve Outcome: Progressing   Problem: Nutritional: Goal: Maintenance of adequate nutrition will improve Outcome: Progressing   Problem: Safety: Goal: Ability to remain free from injury will improve Outcome: Progressing   Problem: Nutrition: Goal: Risk of aspiration will decrease Outcome: Progressing Goal: Dietary intake will improve Outcome: Progressing

## 2023-04-21 NOTE — Progress Notes (Signed)
  Inpatient Rehab Admissions Coordinator :  Per family request, patient was screened for CIR candidacy by Ottie Glazier RN MSN. I will place rehab consult and we will follow up for full assessment of candidacy for CIR. Please call me with any questions.  Ottie Glazier RN MSN Admissions Coordinator [phone]

## 2023-04-21 NOTE — Progress Notes (Signed)
Progress Note    Walter [name]   ZOX:[id number]  DOB: Nov 21, 1940  DOA: 04/19/2023     0 PCP: Walter Majestic, MD  Initial CC: AMS  Hospital Course: [name] is an 82 yo male with PMH chronic dementia, HTN, HLD, DM II, OSA on CPAP, BPH, PTSD/anxiety/depression who presented with altered mentation.  Note, patient recently admitted from 11/29 until 12/1.  Admitted with altered mental status in the setting of sepsis secondary to aspiration pneumonia at the bilateral bases.  AMS also believed to have a delirium component in the setting of chronic dementia.  Was treated with antibiotics with improvement.  On discharge patient was alert and oriented to self only.  Plan for outpatient neurology follow-up.   Patient did follow-up with neurology who recommended continue donepezil and continued Lexapro/Seroquel for PTSD/anxiety/depression.  Further neuropsychiatric testing to be repeated in 2025.   Patient has not returned to previous baseline prior to that admission and is unclear if he will.  However for the past 2 to 3 days prior to admission, he has had significant worsening initially with increasing weakness and now significantly increased confusion with some associated agitation. His confusion appeared similar to how it was just prior to his recent admission.  He underwent workup with imaging studies in the ER.  CT was notable for acute PE involving RUL and RLL; there was also groundglass opacities in the right lower lobe concerning for infection. He was started on heparin drip, oxygen, and antibiotics and admitted for further monitoring.  Interval History:  Little more awake/alert this morning. Conversing easier as well. Had no complaints on eval. Returned to room during PT eval for Left facial droop. Could not fully appreciate any other neuro deficit on exam but agree with left facial droop. Initially had activated code stroke, but then discussed with his wife immediately following who also  confirmed he has chronic left facial droop and EMS also had asked her the same during assessment as well, therefore cancelled code stroke and any further imaging.   Assessment and Plan: * Acute pulmonary embolism (HCC) - CT of the chest abdomen pelvis was obtained showing segmental/subsegmental pulmonary emboli of the right upper lobe and right lower lobe. - Has reportedly had decreased activity since last admission.  Wife reports he is very sedentary at home and has had minimal physical activity and ambulation - Does have history of GI bleeding in 2023; no drop in hemoglobin nor evidence of bleeding while on heparin drip - Okay to transition to Eliquis -Will ask pharmacy to see if price check can be obtained - echo negative for heart strain   Acute metabolic encephalopathy - Believed to be secondary to infection and/or pulmonary embolism in the setting of chronic presumed diagnosis of cognitive impairment/dementia - similar to previous admission earlier in the month for sepsis.  CAP (community acquired pneumonia) - GGO noted on CT - RVP negative; negative covid,flu,rsv on admission  -Continue Rocephin and azithromycin for now.  PCT noted negative x 2; mild leuks, 1% bands  Cognitive impairment - has suspected diagnosis of dementia and if following outpatient with neurology for ongoing workup - History of some delirium and agitation during last admission as well; so far remains cooperative  - Continue home donepezil  History of GI bleed - Was admitted in 2023 for GI bleeding.  Underwent EGD and oozing duodenal ulcer was noted and patient underwent cautery and clipping.  Has follow-up with GI outpatient and had been on PPI but has  since stopped taking this. > Given this was over a year ago we will pursue anticoagulation for pulmonary embolism as above but will start with heparin and restart PPI. - Resume PPI therapy - Trend CBC  Pulmonary nodule - 5 mm right lower lobe pulmonary nodule  is new in the interval. No follow-up needed if patient is low-risk. - 12 mm left lower lobe pulmonary nodule, similar to prior. This was evaluated by PET-CT 10/28/2022 and found to be non hypermetabolic.  Anxiety and depression -  Mentioned in recent neurology note but not listed on problem list.  Recommended to be addressed appropriately along with treating dementia as cognitive decline can be multifactorial. - Previously had been on Lexapro and Seroquel. 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 This lesion was also not hypermetabolic on 10/28/2022 PET imaging and was characterized as stable compared back to 01/26/2022. PET-CT recommendations indicated 2 year follow-up from that exam.  Essential hypertension - Continue home regimen  Obstructive sleep apnea - Continue nightly CPAP  Hyperlipidemia associated with type 2 diabetes mellitus - Continue statin  Diabetes mellitus type II, controlled - SSI and CBG monitoring   Old records reviewed in assessment of this patient  Antimicrobials: Azithromycin 04/19/2023 >> current Rocephin 04/19/2023 >> current  DVT prophylaxis:   apixaban (ELIQUIS) tablet 10 mg  apixaban (ELIQUIS) tablet 5 mg Heparin drip  Code Status:   Code Status: Limited: Do not attempt resuscitation (DNR) -DNR-LIMITED -Do Not Intubate/DNI   Mobility Assessment (Last 72 Hours)     Mobility Assessment     Row Name 04/21/23 1355 04/21/23 0900 04/20/23 2026 04/20/23 1255 04/20/23 1224   Does patient have an order for bedrest or is patient medically unstable -- No - Continue assessment No - Continue assessment -- --   What is the highest level of mobility based on the progressive mobility assessment? Level 4 (Walks with assist in room) - Balance while marching in place and cannot step forward and back - Complete Level 3 (Stands with assist) - Balance  while standing  and cannot march in place Level 3 (Stands with assist) - Balance while standing  and cannot march in place Level 3 (Stands with assist) - Balance while standing  and cannot march in place Level 3 (Stands with assist) - Balance while standing  and cannot march in place   Is the above level different from baseline mobility prior to current illness? -- Yes - Recommend PT order -- -- --    Row Name 04/20/23 1153 04/20/23 1030 04/19/23 2130       Does patient have an order for bedrest or is patient medically unstable -- No - Continue assessment No - Continue assessment     What is the highest level of mobility based on the progressive mobility assessment? -- Level 3 (Stands with assist) - Balance while standing  and cannot march in place Level 3 (Stands with assist) - Balance while standing  and cannot march in place     Is the above level different from baseline mobility prior to current illness? Yes - Recommend PT order -- --              Barriers to discharge: none Disposition Plan:  Home Status is: Inpt  Objective: Blood pressure 118/70, pulse 71, temperature 97.6 F (36.4 C), temperature source Oral, resp. rate 16,  height 6' (1.829 m), weight 78.9 kg, SpO2 90%.  Examination:  Physical Exam Constitutional:      General: He is not in acute distress. HENT:     Head: Normocephalic and atraumatic.     Mouth/Throat:     Mouth: Mucous membranes are moist.  Eyes:     Extraocular Movements: Extraocular movements intact.  Cardiovascular:     Rate and Rhythm: Normal rate and regular rhythm.  Pulmonary:     Effort: Pulmonary effort is normal. No respiratory distress.     Breath sounds: Normal breath sounds. No wheezing.  Abdominal:     General: Bowel sounds are normal. There is no distension.     Palpations: Abdomen is soft.     Tenderness: There is no abdominal tenderness.  Musculoskeletal:        General: Normal range of motion.     Cervical back: Normal range of  motion and neck supple.  Skin:    General: Skin is warm and dry.  Neurological:     Mental Status: He is disoriented.     Cranial Nerves: Facial asymmetry (chronic left facial droop (confirmed by wife)) present.      Consultants:    Procedures:    Data Reviewed: Results for orders placed or performed during the hospital encounter of 04/19/23 (from the past 24 hours)  Heparin level (unfractionated)     Status: Abnormal   Collection Time: 04/20/23  3:53 PM  Result Value Ref Range   Heparin Unfractionated 0.28 (L) 0.30 - 0.70 IU/mL  Glucose, capillary     Status: Abnormal   Collection Time: 04/20/23  4:19 PM  Result Value Ref Range   Glucose-Capillary 206 (H) 70 - 99 mg/dL  Glucose, capillary     Status: Abnormal   Collection Time: 04/20/23  9:19 PM  Result Value Ref Range   Glucose-Capillary 164 (H) 70 - 99 mg/dL  Heparin level (unfractionated)     Status: None   Collection Time: 04/21/23  8:01 AM  Result Value Ref Range   Heparin Unfractionated 0.51 0.30 - 0.70 IU/mL  Basic metabolic panel     Status: Abnormal   Collection Time: 04/21/23  8:01 AM  Result Value Ref Range   Sodium 136 135 - 145 mmol/L   Potassium 3.8 3.5 - 5.1 mmol/L   Chloride 101 98 - 111 mmol/L   CO2 26 22 - 32 mmol/L   Glucose, Bld 174 (H) 70 - 99 mg/dL   BUN 6 (L) 8 - 23 mg/dL   Creatinine, Ser 8.65 0.61 - 1.24 mg/dL   Calcium 8.7 (L) 8.9 - 10.3 mg/dL   GFR, Estimated >78 >46 mL/min   Anion gap 9 5 - 15  CBC with Differential/Platelet     Status: Abnormal   Collection Time: 04/21/23  8:01 AM  Result Value Ref Range   WBC 8.3 4.0 - 10.5 K/uL   RBC 3.96 (L) 4.22 - 5.81 MIL/uL   Hemoglobin 12.4 (L) 13.0 - 17.0 g/dL   HCT 96.2 (L) 95.2 - 84.1 %   MCV 88.6 80.0 - 100.0 fL   MCH 31.3 26.0 - 34.0 pg   MCHC 35.3 30.0 - 36.0 g/dL   RDW 32.4 40.1 - 02.7 %   Platelets 227 150 - 400 K/uL   nRBC 0.0 0.0 - 0.2 %   Neutrophils Relative % 79 %   Neutro Abs 6.5 1.7 - 7.7 K/uL   Lymphocytes Relative 10 %    Lymphs Abs 0.8 0.7 -  4.0 K/uL   Monocytes Relative 9 %   Monocytes Absolute 0.8 0.1 - 1.0 K/uL   Eosinophils Relative 1 %   Eosinophils Absolute 0.1 0.0 - 0.5 K/uL   Basophils Relative 0 %   Basophils Absolute 0.0 0.0 - 0.1 K/uL   Immature Granulocytes 1 %   Abs Immature Granulocytes 0.08 (H) 0.00 - 0.07 K/uL  Magnesium     Status: None   Collection Time: 04/21/23  8:01 AM  Result Value Ref Range   Magnesium 1.7 1.7 - 2.4 mg/dL  Glucose, capillary     Status: Abnormal   Collection Time: 04/21/23  8:01 AM  Result Value Ref Range   Glucose-Capillary 157 (H) 70 - 99 mg/dL  Glucose, capillary     Status: Abnormal   Collection Time: 04/21/23 12:20 PM  Result Value Ref Range   Glucose-Capillary 206 (H) 70 - 99 mg/dL    I have reviewed pertinent nursing notes, vitals, labs, and images as necessary. I have ordered labwork to follow up on as indicated.  I have reviewed the last notes from staff over past 24 hours. I have discussed patient's care plan and test results with nursing staff, CM/SW, and other staff as appropriate.  Time spent: Greater than 50% of the 55 minute visit was spent in counseling/coordination of care for the patient as laid out in the A&P.   LOS: 0 days   Lewie Chamber, MD [facility] 04/21/2023, 3:08 PM

## 2023-04-21 NOTE — TOC Progression Note (Addendum)
Transition of Care [facility]) - Progression Note    Patient Details  Name: Walter [name] MRN: [id number] Date of Birth: 23-Aug-1940  Transition of [facility]) CM/SW Contact  Delilah Shan, LCSWA Phone Number: 04/21/2023, 2:22 PM  Clinical Narrative:     Patients spouse had some questions about short term rehab vrs CIR. CSW answered all questions in regards to short term rehab.Patients spouse request for PT to give her a call. CSW messaged PT. PT confirmed they would reach out to patients spouse to assist with questions for PT. No further questions reported at this time.    Barriers to Discharge: Continued Medical Work up  Expected Discharge Plan and Services In-house Referral: Clinical Social Work     Living arrangements for the past 2 months: Single Family Home                           HH Arranged:  (Active with [place]  for PT OT SLP)           Social Determinants of Health (SDOH) Interventions SDOH Screenings   Food Insecurity: No Food Insecurity (04/20/2023)  Housing: Low Risk  (04/20/2023)  Recent Concern: Housing - High Risk (03/31/2023)  Transportation Needs: No Transportation Needs (04/20/2023)  Utilities: Not At Risk (04/20/2023)  Depression (PHQ2-9): High Risk (04/11/2023)  Financial Resource Strain: Low Risk  (05/12/2022)  Physical Activity: Sufficiently Active (05/12/2022)  Social Connections: Moderately Integrated (05/12/2022)  Stress: No Stress Concern Present (05/12/2022)  Tobacco Use: Medium Risk (04/19/2023)    Readmission Risk Interventions     No data to display

## 2023-04-21 NOTE — Discharge Instructions (Signed)
Information on my medicine - ELIQUIS (apixaban)  This medication education was reviewed with me or my healthcare representative as part of my discharge preparation.  The pharmacist that spoke with me during my hospital stay was:  Mosetta Anis, [facility]  Why was Eliquis prescribed for you? Eliquis was prescribed to treat blood clots that may have been found in the veins of your legs (deep vein thrombosis) or in your lungs (pulmonary embolism) and to reduce the risk of them occurring again.  What do You need to know about Eliquis ? The starting dose is 10 mg (two 5 mg tablets) taken TWICE daily for the FIRST SEVEN (7) DAYS, then on (enter date)  04/27/24  the dose is reduced to ONE 5 mg tablet taken TWICE daily.  Eliquis may be taken with or without food.   Try to take the dose about the same time in the morning and in the evening. If you have difficulty swallowing the tablet whole please discuss with your pharmacist how to take the medication safely.  Take Eliquis exactly as prescribed and DO NOT stop taking Eliquis without talking to the doctor who prescribed the medication.  Stopping may increase your risk of developing a new blood clot.  Refill your prescription before you run out.  After discharge, you should have regular check-up appointments with your healthcare provider that is prescribing your Eliquis.    What do you do if you miss a dose? If a dose of ELIQUIS is not taken at the scheduled time, take it as soon as possible on the same day and twice-daily administration should be resumed. The dose should not be doubled to make up for a missed dose.  Important Safety Information A possible side effect of Eliquis is bleeding. You should call your healthcare provider right away if you experience any of the following: Bleeding from an injury or your nose that does not stop. Unusual colored urine (red or dark brown) or unusual colored stools (red or black). Unusual bruising for  unknown reasons. A serious fall or if you hit your head (even if there is no bleeding).  Some medicines may interact with Eliquis and might increase your risk of bleeding or clotting while on Eliquis. To help avoid this, consult your healthcare provider or pharmacist prior to using any new prescription or non-prescription medications, including herbals, vitamins, non-steroidal anti-inflammatory drugs (NSAIDs) and supplements.  This website has more information on Eliquis (apixaban): http://www.eliquis.com/eliquis/home

## 2023-04-22 DIAGNOSIS — J189 Pneumonia, unspecified organism: Secondary | ICD-10-CM | POA: Diagnosis not present

## 2023-04-22 DIAGNOSIS — G9341 Metabolic encephalopathy: Secondary | ICD-10-CM | POA: Diagnosis not present

## 2023-04-22 DIAGNOSIS — I2699 Other pulmonary embolism without acute cor pulmonale: Secondary | ICD-10-CM | POA: Diagnosis not present

## 2023-04-22 DIAGNOSIS — R4189 Other symptoms and signs involving cognitive functions and awareness: Secondary | ICD-10-CM | POA: Diagnosis not present

## 2023-04-22 LAB — GLUCOSE, CAPILLARY
Glucose-Capillary: 199 mg/dL — ABNORMAL HIGH (ref 70–99)
Glucose-Capillary: 161 mg/dL — ABNORMAL HIGH (ref 70–99)
Glucose-Capillary: 241 mg/dL — ABNORMAL HIGH (ref 70–99)
Glucose-Capillary: 244 mg/dL — ABNORMAL HIGH (ref 70–99)

## 2023-04-22 LAB — BASIC METABOLIC PANEL
Anion gap: 8 (ref 5–15)
BUN: 7 mg/dL — ABNORMAL LOW (ref 8–23)
CO2: 29 mmol/L (ref 22–32)
Calcium: 8.9 mg/dL (ref 8.9–10.3)
Chloride: 101 mmol/L (ref 98–111)
Creatinine, Ser: 0.9 mg/dL (ref 0.61–1.24)
GFR, Estimated: >60 mL/min (ref >60)
Glucose, Bld: 157 mg/dL — ABNORMAL HIGH (ref 70–99)
Potassium: 4 mmol/L (ref 3.5–5.1)
Sodium: 138 mmol/L (ref 135–145)

## 2023-04-22 LAB — CBC WITH DIFFERENTIAL/PLATELET
Abs Immature Granulocytes: 0.05 10*3/uL (ref 0.00–0.07)
Basophils Absolute: 0 10*3/uL (ref 0.0–0.1)
Basophils Relative: 0 %
Eosinophils Absolute: 0.2 10*3/uL (ref 0.0–0.5)
Eosinophils Relative: 2 %
HCT: 35.3 % — ABNORMAL LOW (ref 39.0–52.0)
Hemoglobin: 12 g/dL — ABNORMAL LOW (ref 13.0–17.0)
Immature Granulocytes: 1 %
Lymphocytes Relative: 12 %
Lymphs Abs: 1.1 10*3/uL (ref 0.7–4.0)
MCH: 30.5 pg (ref 26.0–34.0)
MCHC: 34 g/dL (ref 30.0–36.0)
MCV: 89.6 fL (ref 80.0–100.0)
Monocytes Absolute: 0.7 10*3/uL (ref 0.1–1.0)
Monocytes Relative: 7 %
Neutro Abs: 7 10*3/uL (ref 1.7–7.7)
Neutrophils Relative %: 78 %
Platelets: 270 10*3/uL (ref 150–400)
RBC: 3.94 MIL/uL — ABNORMAL LOW (ref 4.22–5.81)
RDW: 12.6 % (ref 11.5–15.5)
WBC: 9 10*3/uL (ref 4.0–10.5)
nRBC: 0 % (ref 0.0–0.2)

## 2023-04-22 LAB — MAGNESIUM: Magnesium: 1.7 mg/dL (ref 1.7–2.4)

## 2023-04-22 LAB — HEPARIN LEVEL (UNFRACTIONATED): Heparin Unfractionated: >1.1 [IU]/mL — ABNORMAL HIGH (ref 0.30–0.70)

## 2023-04-22 MED ORDER — ENSURE ENLIVE PO LIQD
237.0000 mL | Freq: Two times a day (BID) | ORAL | Status: DC
  Administered 2023-04-22 – 2023-05-03 (×20): 237 mL via ORAL

## 2023-04-22 NOTE — Plan of Care (Signed)
Problem: Education: Goal: Ability to describe self-care measures that may prevent or decrease complications (Diabetes Survival Skills Education) will improve Outcome: Progressing Goal: Individualized Educational Video(s) Outcome: Progressing   Problem: Coping: Goal: Ability to adjust to condition or change in health will improve Outcome: Progressing   Problem: Fluid Volume: Goal: Ability to maintain a balanced intake and output will improve Outcome: Progressing   Problem: Health Behavior/Discharge Planning: Goal: Ability to identify and utilize available resources and services will improve Outcome: Progressing Goal: Ability to manage health-related needs will improve Outcome: Progressing   Problem: Metabolic: Goal: Ability to maintain appropriate glucose levels will improve Outcome: Progressing   Problem: Nutritional: Goal: Maintenance of adequate nutrition will improve Outcome: Progressing Goal: Progress toward achieving an optimal weight will improve Outcome: Progressing   Problem: Skin Integrity: Goal: Risk for impaired skin integrity will decrease Outcome: Progressing   Problem: Tissue Perfusion: Goal: Adequacy of tissue perfusion will improve Outcome: Progressing   Problem: Education: Goal: Knowledge of General Education information will improve Description: Including pain rating scale, medication(s)/side effects and non-pharmacologic comfort measures Outcome: Progressing   Problem: Health Behavior/Discharge Planning: Goal: Ability to manage health-related needs will improve Outcome: Progressing   Problem: Clinical Measurements: Goal: Ability to maintain clinical measurements within normal limits will improve Outcome: Progressing Goal: Will remain free from infection Outcome: Progressing Goal: Diagnostic test results will improve Outcome: Progressing Goal: Respiratory complications will improve Outcome: Progressing Goal: Cardiovascular complication will  be avoided Outcome: Progressing   Problem: Activity: Goal: Risk for activity intolerance will decrease Outcome: Progressing   Problem: Nutrition: Goal: Adequate nutrition will be maintained Outcome: Progressing   Problem: Coping: Goal: Level of anxiety will decrease Outcome: Progressing   Problem: Elimination: Goal: Will not experience complications related to bowel motility Outcome: Progressing Goal: Will not experience complications related to urinary retention Outcome: Progressing   Problem: Pain Management: Goal: General experience of comfort will improve Outcome: Progressing   Problem: Safety: Goal: Ability to remain free from injury will improve Outcome: Progressing   Problem: Skin Integrity: Goal: Risk for impaired skin integrity will decrease Outcome: Progressing   Problem: Activity: Goal: Ability to tolerate increased activity will improve Outcome: Progressing   Problem: Clinical Measurements: Goal: Ability to maintain a body temperature in the normal range will improve Outcome: Progressing   Problem: Respiratory: Goal: Ability to maintain adequate ventilation will improve Outcome: Progressing Goal: Ability to maintain a clear airway will improve Outcome: Progressing   Problem: Safety: Goal: Non-violent Restraint(s) Outcome: Progressing   Problem: Education: Goal: Knowledge of disease or condition will improve Outcome: Progressing Goal: Knowledge of secondary prevention will improve (MUST DOCUMENT ALL) Outcome: Progressing Goal: Knowledge of patient specific risk factors will improve Loraine Leriche N/A or DELETE if not current risk factor) Outcome: Progressing   Problem: Ischemic Stroke/TIA Tissue Perfusion: Goal: Complications of ischemic stroke/TIA will be minimized Outcome: Progressing   Problem: Coping: Goal: Will verbalize positive feelings about self Outcome: Progressing Goal: Will identify appropriate support needs Outcome: Progressing    Problem: Health Behavior/Discharge Planning: Goal: Ability to manage health-related needs will improve Outcome: Progressing Goal: Goals will be collaboratively established with patient/family Outcome: Progressing   Problem: Self-Care: Goal: Ability to participate in self-care as condition permits will improve Outcome: Progressing Goal: Verbalization of feelings and concerns over difficulty with self-care will improve Outcome: Progressing Goal: Ability to communicate needs accurately will improve Outcome: Progressing   Problem: Nutrition: Goal: Risk of aspiration will decrease Outcome: Progressing Goal: Dietary intake will improve Outcome: Progressing

## 2023-04-22 NOTE — TOC Initial Note (Signed)
Transition of Care [facility]) - Initial/Assessment Note    Patient Details  Name: Walter [name] MRN: [id number] Date of Birth: 1940/09/16  Transition of Care [facility]) CM/SW Contact:    Patrice Paradise, LCSW Phone Number: 04/22/2023, 3:08 PM  Clinical Narrative:                 CSW spoke with pt's daughter and she confirmed that family is not satisfied with the four bed offers that have been given. She asked about other facilities and CSW let her know if a referral had been sent. It was determined that 2 facilities the referral had not been sent which was [facility] and [name]. Referrals was sent and waiting on outcome of pt being appropriate for CIR.  TOC team will continue to assist with discharge planning needs.     Barriers to Discharge: Continued Medical Work up   Patient Goals and CMS Choice     Choice offered to / list presented to : Spouse      Expected Discharge Plan and Services In-house Referral: Clinical Social Work     Living arrangements for the past 2 months: Single Family Home                           HH Arranged:  (Active with Artist  for PT OT SLP)          Prior Living Arrangements/Services Living arrangements for the past 2 months: Single Family Home Lives with:: Spouse Patient language and need for interpreter reviewed:: Yes        Need for Family Participation in Patient Care: Yes (Comment) Care giver support system in place?: Yes (comment) Current home services: DME, Home PT (HOme SLP) Criminal Activity/Legal Involvement Pertinent to Current Situation/Hospitalization: No - Comment as needed  Activities of Daily Living   ADL Screening (condition at time of admission) Independently performs ADLs?: No Does the patient have a NEW difficulty with bathing/dressing/toileting/self-feeding that is expected to last >3 days?: Yes (Initiates electronic notice to provider for possible OT consult) Does the patient have a NEW difficulty with getting  in/out of bed, walking, or climbing stairs that is expected to last >3 days?: Yes (Initiates electronic notice to provider for possible PT consult) Does the patient have a NEW difficulty with communication that is expected to last >3 days?: Yes (Initiates electronic notice to provider for possible SLP consult) Is the patient deaf or have difficulty hearing?: Yes Does the patient have difficulty seeing, even when wearing glasses/contacts?: No Does the patient have difficulty concentrating, remembering, or making decisions?: Yes  Permission Sought/Granted      Share Information with NAME: Spouse MS Dols           Emotional Assessment       Orientation: : Fluctuating Orientation (Suspected and/or reported Sundowners)   Psych Involvement: No (comment)  Admission diagnosis:  Acute encephalopathy [G93.40] Altered mental status, unspecified altered mental status type [R41.82] Pneumonia due to infectious organism, unspecified laterality, unspecified part of lung [J18.9] Acute pulmonary embolism, unspecified pulmonary embolism type, unspecified whether acute cor pulmonale present (HCC) [I26.99] Patient Active Problem List   Diagnosis Date Noted   History of GI bleed 04/20/2023   Anxiety and depression 04/20/2023   Acute pulmonary embolism (HCC) 04/19/2023   Acute encephalopathy 04/19/2023   Cognitive impairment 04/01/2023   Acute metabolic encephalopathy 03/31/2023   Thyroid nodule 03/31/2023   Lactic acidosis 03/31/2023   History of dysplastic  nevus    History of skin cancer    Pulmonary nodule 02/04/2022   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   Allergic rhinitis 08/19/2019   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   BPH (benign prostatic hyperplasia) 07/02/2015   CAP (community acquired pneumonia) 05/19/2015   Hallux valgus of right foot 03/26/2014   History of colon cancer 01/27/2014    Iritis 12/17/2008   Postherpetic neuralgia 05/22/2008   Bigeminy 04/21/2008   Diabetes mellitus type II, controlled 04/23/2007   Hyperlipidemia associated with type 2 diabetes mellitus 04/23/2007   Obstructive sleep apnea 04/23/2007   Essential hypertension 04/23/2007   PCP:  Shelva Majestic, MD Pharmacy:   [facility] [phone] - [place], [place] - [address] AT [address] OF [facility] [address] [place] [address] Phone: [phone] Fax: [phone]     Social Drivers of Health (SDOH) Social History: SDOH Screenings   Food Insecurity: No Food Insecurity (04/20/2023)  Housing: Low Risk  (04/20/2023)  Recent Concern: Housing - High Risk (03/31/2023)  Transportation Needs: No Transportation Needs (04/20/2023)  Utilities: Not At Risk (04/20/2023)  Depression (PHQ2-9): High Risk (04/11/2023)  Financial Resource Strain: Low Risk  (05/12/2022)  Physical Activity: Sufficiently Active (05/12/2022)  Social Connections: Moderately Integrated (05/12/2022)  Stress: No Stress Concern Present (05/12/2022)  Tobacco Use: Medium Risk (04/19/2023)   SDOH Interventions:     Readmission Risk Interventions     No data to display

## 2023-04-22 NOTE — Progress Notes (Signed)
Progress Note    Osman Case   VWU:[id number]  DOB: 1940/10/20  DOA: 04/19/2023     1 PCP: Shelva Majestic, MD  Initial CC: AMS  Hospital Course: Mr. Gianino is an 82 yo male with PMH chronic dementia, HTN, HLD, DM II, OSA on CPAP, BPH, PTSD/anxiety/depression who presented with altered mentation.  Note, patient recently admitted from 11/29 until 12/1.  Admitted with altered mental status in the setting of sepsis secondary to aspiration pneumonia at the bilateral bases.  AMS also believed to have a delirium component in the setting of chronic dementia.  Was treated with antibiotics with improvement.  On discharge patient was alert and oriented to self only.  Plan for outpatient neurology follow-up.   Patient did follow-up with neurology who recommended continue donepezil and continued Lexapro/Seroquel for PTSD/anxiety/depression.  Further neuropsychiatric testing to be repeated in 2025.   Patient has not returned to previous baseline prior to that admission and is unclear if he will.  However for the past 2 to 3 days prior to admission, he has had significant worsening initially with increasing weakness and now significantly increased confusion with some associated agitation. His confusion appeared similar to how it was just prior to his recent admission.  He underwent workup with imaging studies in the ER.  CT was notable for acute PE involving RUL and RLL; there was also groundglass opacities in the right lower lobe concerning for infection. He was started on heparin drip, oxygen, and antibiotics and admitted for further monitoring.  Interval History:  No events overnight.  Resting comfortably when seen this morning.  No acute concerns or issues. Becoming more alert and seemingly stronger each day.  Updated family on phone this afternoon.   Assessment and Plan: * Acute pulmonary embolism (HCC) - CT of the chest abdomen pelvis was obtained showing segmental/subsegmental pulmonary emboli  of the right upper lobe and right lower lobe. - Has reportedly had decreased activity since last admission.  Wife reports he is very sedentary at home and has had minimal physical activity and ambulation - Does have history of GI bleeding in 2023; no drop in hemoglobin nor evidence of bleeding while on heparin drip - Okay to transition to Eliquis -Will ask pharmacy to see if price check can be obtained - echo negative for heart strain   Acute metabolic encephalopathy - Believed to be secondary to infection and/or pulmonary embolism in the setting of chronic presumed diagnosis of cognitive impairment/dementia - similar to previous admission earlier in the month for sepsis.  CAP (community acquired pneumonia) - GGO noted on CT - RVP negative; negative covid,flu,rsv on admission  -Continue Rocephin and azithromycin for now.  PCT noted negative x 2; mild leuks, 1% bands  Cognitive impairment - has suspected diagnosis of dementia and if following outpatient with neurology for ongoing workup - History of some delirium and agitation during last admission as well; so far remains cooperative  - Continue home donepezil  History of GI bleed - Was admitted in 2023 for GI bleeding.  Underwent EGD and oozing duodenal ulcer was noted and patient underwent cautery and clipping.  Has follow-up with GI outpatient and had been on PPI but has since stopped taking this. > Given this was over a year ago we will pursue anticoagulation for pulmonary embolism as above but will start with heparin and restart PPI. - Resume PPI therapy - Trend CBC  Pulmonary nodule - 5 mm right lower lobe pulmonary nodule is new in the  interval. No follow-up needed if patient is low-risk. - 12 mm left lower lobe pulmonary nodule, similar to prior. This was evaluated by PET-CT 10/28/2022 and found to be non hypermetabolic.  Anxiety and depression -  Mentioned in recent neurology note but not listed on problem list.  Recommended  to be addressed appropriately along with treating dementia as cognitive decline can be multifactorial. - Previously had been on Lexapro and Seroquel. 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 This lesion was also not hypermetabolic on 10/28/2022 PET imaging and was characterized as stable compared back to 01/26/2022. PET-CT recommendations indicated 2 year follow-up from that exam.  Essential hypertension - Continue home regimen  Obstructive sleep apnea - Continue nightly CPAP  Hyperlipidemia associated with type 2 diabetes mellitus - Continue statin  Diabetes mellitus type II, controlled - SSI and CBG monitoring   Old records reviewed in assessment of this patient  Antimicrobials: Azithromycin 04/19/2023 >> current Rocephin 04/19/2023 >> current  DVT prophylaxis:   apixaban (ELIQUIS) tablet 10 mg  apixaban (ELIQUIS) tablet 5 mg Heparin drip  Code Status:   Code Status: Limited: Do not attempt resuscitation (DNR) -DNR-LIMITED -Do Not Intubate/DNI   Mobility Assessment (Last 72 Hours)     Mobility Assessment     Row Name 04/21/23 2100 04/21/23 1355 04/21/23 0900 04/20/23 2026 04/20/23 1255   Does patient have an order for bedrest or is patient medically unstable No - Continue assessment -- No - Continue assessment No - Continue assessment --   What is the highest level of mobility based on the progressive mobility assessment? Level 4 (Walks with assist in room) - Balance while marching in place and cannot step forward and back - Complete Level 4 (Walks with assist in room) - Balance while marching in place and cannot step forward and back - Complete Level 3 (Stands with assist) - Balance while standing  and cannot march in place Level 3 (Stands with assist) - Balance while standing  and cannot march in place Level 3 (Stands with assist) - Balance while standing  and  cannot march in place   Is the above level different from baseline mobility prior to current illness? -- -- Yes - Recommend PT order -- --    Row Name 04/20/23 1224 04/20/23 1153 04/20/23 1030 04/19/23 2130     Does patient have an order for bedrest or is patient medically unstable -- -- No - Continue assessment No - Continue assessment    What is the highest level of mobility based on the progressive mobility assessment? Level 3 (Stands with assist) - Balance while standing  and cannot march in place -- Level 3 (Stands with assist) - Balance while standing  and cannot march in place Level 3 (Stands with assist) - Balance while standing  and cannot march in place    Is the above level different from baseline mobility prior to current illness? -- Yes - Recommend PT order -- --             Barriers to discharge: none Disposition Plan:  Home Status is: Inpt  Objective: Blood pressure 122/69, pulse 74, temperature 97.9 F (36.6 C), temperature source Oral, resp. rate 17, height 6' (1.829 m), weight 78.6 kg, SpO2 93%.  Examination:  Physical Exam Constitutional:      General: He is not in acute distress. HENT:  Head: Normocephalic and atraumatic.     Mouth/Throat:     Mouth: Mucous membranes are moist.  Eyes:     Extraocular Movements: Extraocular movements intact.  Cardiovascular:     Rate and Rhythm: Normal rate and regular rhythm.  Pulmonary:     Effort: Pulmonary effort is normal. No respiratory distress.     Breath sounds: Normal breath sounds. No wheezing.  Abdominal:     General: Bowel sounds are normal. There is no distension.     Palpations: Abdomen is soft.     Tenderness: There is no abdominal tenderness.  Musculoskeletal:        General: Normal range of motion.     Cervical back: Normal range of motion and neck supple.  Skin:    General: Skin is warm and dry.  Neurological:     Mental Status: He is disoriented.     Cranial Nerves: Facial asymmetry (chronic  left facial droop (confirmed by wife)) present.      Consultants:    Procedures:    Data Reviewed: Results for orders placed or performed during the hospital encounter of 04/19/23 (from the past 24 hours)  Glucose, capillary     Status: Abnormal   Collection Time: 04/21/23  4:44 PM  Result Value Ref Range   Glucose-Capillary 174 (H) 70 - 99 mg/dL  Glucose, capillary     Status: Abnormal   Collection Time: 04/21/23  9:33 PM  Result Value Ref Range   Glucose-Capillary 130 (H) 70 - 99 mg/dL  Heparin level (unfractionated)     Status: Abnormal   Collection Time: 04/22/23  3:34 AM  Result Value Ref Range   Heparin Unfractionated >1.10 (H) 0.30 - 0.70 IU/mL  Basic metabolic panel     Status: Abnormal   Collection Time: 04/22/23  3:34 AM  Result Value Ref Range   Sodium 138 135 - 145 mmol/L   Potassium 4.0 3.5 - 5.1 mmol/L   Chloride 101 98 - 111 mmol/L   CO2 29 22 - 32 mmol/L   Glucose, Bld 157 (H) 70 - 99 mg/dL   BUN 7 (L) 8 - 23 mg/dL   Creatinine, Ser 6.38 0.61 - 1.24 mg/dL   Calcium 8.9 8.9 - 75.6 mg/dL   GFR, Estimated >43 >32 mL/min   Anion gap 8 5 - 15  CBC with Differential/Platelet     Status: Abnormal   Collection Time: 04/22/23  3:34 AM  Result Value Ref Range   WBC 9.0 4.0 - 10.5 K/uL   RBC 3.94 (L) 4.22 - 5.81 MIL/uL   Hemoglobin 12.0 (L) 13.0 - 17.0 g/dL   HCT 95.1 (L) 88.4 - 16.6 %   MCV 89.6 80.0 - 100.0 fL   MCH 30.5 26.0 - 34.0 pg   MCHC 34.0 30.0 - 36.0 g/dL   RDW 06.3 01.6 - 01.0 %   Platelets 270 150 - 400 K/uL   nRBC 0.0 0.0 - 0.2 %   Neutrophils Relative % 78 %   Neutro Abs 7.0 1.7 - 7.7 K/uL   Lymphocytes Relative 12 %   Lymphs Abs 1.1 0.7 - 4.0 K/uL   Monocytes Relative 7 %   Monocytes Absolute 0.7 0.1 - 1.0 K/uL   Eosinophils Relative 2 %   Eosinophils Absolute 0.2 0.0 - 0.5 K/uL   Basophils Relative 0 %   Basophils Absolute 0.0 0.0 - 0.1 K/uL   Immature Granulocytes 1 %   Abs Immature Granulocytes 0.05 0.00 - 0.07 K/uL  Magnesium  Status: None   Collection Time: 04/22/23  3:34 AM  Result Value Ref Range   Magnesium 1.7 1.7 - 2.4 mg/dL  Glucose, capillary     Status: Abnormal   Collection Time: 04/22/23  7:45 AM  Result Value Ref Range   Glucose-Capillary 199 (H) 70 - 99 mg/dL  Glucose, capillary     Status: Abnormal   Collection Time: 04/22/23 11:54 AM  Result Value Ref Range   Glucose-Capillary 161 (H) 70 - 99 mg/dL    I have reviewed pertinent nursing notes, vitals, labs, and images as necessary. I have ordered labwork to follow up on as indicated.  I have reviewed the last notes from staff over past 24 hours. I have discussed patient's care plan and test results with nursing staff, CM/SW, and other staff as appropriate.    LOS: 1 day   Lewie Chamber, MD [facility] 04/22/2023, 2:09 PM

## 2023-04-23 DIAGNOSIS — J189 Pneumonia, unspecified organism: Secondary | ICD-10-CM | POA: Diagnosis not present

## 2023-04-23 DIAGNOSIS — G9341 Metabolic encephalopathy: Secondary | ICD-10-CM | POA: Diagnosis not present

## 2023-04-23 DIAGNOSIS — R4189 Other symptoms and signs involving cognitive functions and awareness: Secondary | ICD-10-CM | POA: Diagnosis not present

## 2023-04-23 DIAGNOSIS — I2699 Other pulmonary embolism without acute cor pulmonale: Secondary | ICD-10-CM | POA: Diagnosis not present

## 2023-04-23 LAB — CBC WITH DIFFERENTIAL/PLATELET
Abs Immature Granulocytes: 0.07 10*3/uL (ref 0.00–0.07)
Basophils Absolute: 0 10*3/uL (ref 0.0–0.1)
Basophils Relative: 0 %
Eosinophils Absolute: 0.2 10*3/uL (ref 0.0–0.5)
Eosinophils Relative: 2 %
HCT: 35.4 % — ABNORMAL LOW (ref 39.0–52.0)
Hemoglobin: 12.1 g/dL — ABNORMAL LOW (ref 13.0–17.0)
Immature Granulocytes: 1 %
Lymphocytes Relative: 10 %
Lymphs Abs: 0.9 10*3/uL (ref 0.7–4.0)
MCH: 30.7 pg (ref 26.0–34.0)
MCHC: 34.2 g/dL (ref 30.0–36.0)
MCV: 89.8 fL (ref 80.0–100.0)
Monocytes Absolute: 0.7 10*3/uL (ref 0.1–1.0)
Monocytes Relative: 8 %
Neutro Abs: 7.1 10*3/uL (ref 1.7–7.7)
Neutrophils Relative %: 79 %
Platelets: 286 10*3/uL (ref 150–400)
RBC: 3.94 MIL/uL — ABNORMAL LOW (ref 4.22–5.81)
RDW: 12.4 % (ref 11.5–15.5)
WBC: 9 10*3/uL (ref 4.0–10.5)
nRBC: 0 % (ref 0.0–0.2)

## 2023-04-23 LAB — GLUCOSE, CAPILLARY
Glucose-Capillary: 252 mg/dL — ABNORMAL HIGH (ref 70–99)
Glucose-Capillary: 159 mg/dL — ABNORMAL HIGH (ref 70–99)
Glucose-Capillary: 252 mg/dL — ABNORMAL HIGH (ref 70–99)
Glucose-Capillary: 260 mg/dL — ABNORMAL HIGH (ref 70–99)

## 2023-04-23 NOTE — Progress Notes (Signed)
Progress Note    Walter [name]   QMV:[id number]  DOB: January 12, 1941  DOA: 04/19/2023     2 PCP: Walter Majestic, MD  Initial CC: AMS  Hospital Course: [name] is an 82 yo male with PMH chronic dementia, HTN, HLD, DM II, OSA on CPAP, BPH, PTSD/anxiety/depression who presented with altered mentation.  Note, patient recently admitted from 11/29 until 12/1.  Admitted with altered mental status in the setting of sepsis secondary to aspiration pneumonia at the bilateral bases.  AMS also believed to have a delirium component in the setting of chronic dementia.  Was treated with antibiotics with improvement.  On discharge patient was alert and oriented to self only.  Plan for outpatient neurology follow-up.   Patient did follow-up with neurology who recommended continue donepezil and continued Lexapro/Seroquel for PTSD/anxiety/depression.  Further neuropsychiatric testing to be repeated in 2025.   Patient has not returned to previous baseline prior to that admission and is unclear if he will.  However for the past 2 to 3 days prior to admission, he has had significant worsening initially with increasing weakness and now significantly increased confusion with some associated agitation. His confusion appeared similar to how it was just prior to his recent admission.  He underwent workup with imaging studies in the ER.  CT was notable for acute PE involving RUL and RLL; there was also groundglass opacities in the right lower lobe concerning for infection. He was started on heparin drip, oxygen, and antibiotics and admitted for further monitoring.  Interval History:  No events overnight.  Resting comfortably when seen this morning.  No acute concerns or issues. Still seeking rehab vs CIR.   Assessment and Plan: * Acute pulmonary embolism (HCC) - CT of the chest abdomen pelvis was obtained showing segmental/subsegmental pulmonary emboli of the right upper lobe and right lower lobe. - Has reportedly  had decreased activity since last admission.  Wife reports he is very sedentary at home and has had minimal physical activity and ambulation - Does have history of GI bleeding in 2023; no drop in hemoglobin nor evidence of bleeding while on heparin drip - Okay to transition to Eliquis -Will ask pharmacy to see if price check can be obtained - echo negative for heart strain   Acute metabolic encephalopathy - Believed to be secondary to infection and/or pulmonary embolism in the setting of chronic presumed diagnosis of cognitive impairment/dementia - similar to previous admission earlier in the month for sepsis.  CAP (community acquired pneumonia) - GGO noted on CT - RVP negative; negative covid,flu,rsv on admission  -Continue Rocephin and azithromycin for now.  PCT noted negative x 2; mild leuks, 1% bands  Cognitive impairment - has suspected diagnosis of dementia and if following outpatient with neurology for ongoing workup - History of some delirium and agitation during last admission as well; so far remains cooperative  - Continue home donepezil  History of GI bleed - Was admitted in 2023 for GI bleeding.  Underwent EGD and oozing duodenal ulcer was noted and patient underwent cautery and clipping.  Has follow-up with GI outpatient and had been on PPI but has since stopped taking this. > Given this was over a year ago we will pursue anticoagulation for pulmonary embolism as above but will start with heparin and restart PPI. - Resume PPI therapy - Trend CBC  Pulmonary nodule - 5 mm right lower lobe pulmonary nodule is new in the interval. No follow-up needed if patient is low-risk. - 12  mm left lower lobe pulmonary nodule, similar to prior. This was evaluated by PET-CT 10/28/2022 and found to be non hypermetabolic.  Anxiety and depression -  Mentioned in recent neurology note but not listed on problem list.  Recommended to be addressed appropriately along with treating dementia as  cognitive decline can be multifactorial. - Previously had been on Lexapro and Seroquel. 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 This lesion was also not hypermetabolic on 10/28/2022 PET imaging and was characterized as stable compared back to 01/26/2022. PET-CT recommendations indicated 2 year follow-up from that exam.  Essential hypertension - Continue home regimen  Obstructive sleep apnea - Continue nightly CPAP  Hyperlipidemia associated with type 2 diabetes mellitus - Continue statin  Diabetes mellitus type II, controlled - SSI and CBG monitoring   Old records reviewed in assessment of this patient  Antimicrobials: Azithromycin 04/19/2023 >> current Rocephin 04/19/2023 >> current  DVT prophylaxis:   apixaban (ELIQUIS) tablet 10 mg  apixaban (ELIQUIS) tablet 5 mg   Code Status:   Code Status: Limited: Do not attempt resuscitation (DNR) -DNR-LIMITED -Do Not Intubate/DNI   Mobility Assessment (Last 72 Hours)     Mobility Assessment     Row Name 04/23/23 0800 04/23/23 0400 04/23/23 0000 04/22/23 2100 04/22/23 0800   Does patient have an order for bedrest or is patient medically unstable No - Continue assessment No - Continue assessment No - Continue assessment No - Continue assessment No - Continue assessment   What is the highest level of mobility based on the progressive mobility assessment? Level 4 (Walks with assist in room) - Balance while marching in place and cannot step forward and back - Complete Level 4 (Walks with assist in room) - Balance while marching in place and cannot step forward and back - Complete Level 4 (Walks with assist in room) - Balance while marching in place and cannot step forward and back - Complete Level 4 (Walks with assist in room) - Balance while marching in place and cannot step forward and back - Complete Level 4 (Walks with  assist in room) - Balance while marching in place and cannot step forward and back - Complete   Is the above level different from baseline mobility prior to current illness? Yes - Recommend PT order Yes - Recommend PT order Yes - Recommend PT order Yes - Recommend PT order --    Row Name 04/21/23 2100 04/21/23 1355 04/21/23 0900 04/20/23 2026 04/20/23 1255   Does patient have an order for bedrest or is patient medically unstable No - Continue assessment -- No - Continue assessment No - Continue assessment --   What is the highest level of mobility based on the progressive mobility assessment? Level 4 (Walks with assist in room) - Balance while marching in place and cannot step forward and back - Complete Level 4 (Walks with assist in room) - Balance while marching in place and cannot step forward and back - Complete Level 3 (Stands with assist) - Balance while standing  and cannot march in place Level 3 (Stands with assist) - Balance while standing  and cannot march in place Level 3 (Stands with assist) - Balance while standing  and cannot march in place   Is the above level different from baseline mobility prior to current illness? -- -- Yes - Recommend PT order -- --  Barriers to discharge: none Disposition Plan:  rehab vs CIR Status is: Inpt  Objective: Blood pressure (!) 142/75, pulse 80, temperature 98.1 F (36.7 C), temperature source Oral, resp. rate 20, height 6' (1.829 m), weight 77.8 kg, SpO2 96%.  Examination:  Physical Exam Constitutional:      General: He is not in acute distress. HENT:     Head: Normocephalic and atraumatic.     Mouth/Throat:     Mouth: Mucous membranes are moist.  Eyes:     Extraocular Movements: Extraocular movements intact.  Cardiovascular:     Rate and Rhythm: Normal rate and regular rhythm.  Pulmonary:     Effort: Pulmonary effort is normal. No respiratory distress.     Breath sounds: Normal breath sounds. No wheezing.  Abdominal:      General: Bowel sounds are normal. There is no distension.     Palpations: Abdomen is soft.     Tenderness: There is no abdominal tenderness.  Musculoskeletal:        General: Normal range of motion.     Cervical back: Normal range of motion and neck supple.  Skin:    General: Skin is warm and dry.  Neurological:     Mental Status: He is disoriented.     Cranial Nerves: Facial asymmetry (chronic left facial droop (confirmed by wife)) present.      Consultants:    Procedures:    Data Reviewed: Results for orders placed or performed during the hospital encounter of 04/19/23 (from the past 24 hours)  Glucose, capillary     Status: Abnormal   Collection Time: 04/22/23  4:20 PM  Result Value Ref Range   Glucose-Capillary 241 (H) 70 - 99 mg/dL  Glucose, capillary     Status: Abnormal   Collection Time: 04/22/23  9:12 PM  Result Value Ref Range   Glucose-Capillary 244 (H) 70 - 99 mg/dL  CBC with Differential/Platelet     Status: Abnormal   Collection Time: 04/23/23  3:56 AM  Result Value Ref Range   WBC 9.0 4.0 - 10.5 K/uL   RBC 3.94 (L) 4.22 - 5.81 MIL/uL   Hemoglobin 12.1 (L) 13.0 - 17.0 g/dL   HCT 29.5 (L) 62.1 - 30.8 %   MCV 89.8 80.0 - 100.0 fL   MCH 30.7 26.0 - 34.0 pg   MCHC 34.2 30.0 - 36.0 g/dL   RDW 65.7 84.6 - 96.2 %   Platelets 286 150 - 400 K/uL   nRBC 0.0 0.0 - 0.2 %   Neutrophils Relative % 79 %   Neutro Abs 7.1 1.7 - 7.7 K/uL   Lymphocytes Relative 10 %   Lymphs Abs 0.9 0.7 - 4.0 K/uL   Monocytes Relative 8 %   Monocytes Absolute 0.7 0.1 - 1.0 K/uL   Eosinophils Relative 2 %   Eosinophils Absolute 0.2 0.0 - 0.5 K/uL   Basophils Relative 0 %   Basophils Absolute 0.0 0.0 - 0.1 K/uL   Immature Granulocytes 1 %   Abs Immature Granulocytes 0.07 0.00 - 0.07 K/uL  Glucose, capillary     Status: Abnormal   Collection Time: 04/23/23  8:15 AM  Result Value Ref Range   Glucose-Capillary 252 (H) 70 - 99 mg/dL  Glucose, capillary     Status: Abnormal    Collection Time: 04/23/23 11:51 AM  Result Value Ref Range   Glucose-Capillary 159 (H) 70 - 99 mg/dL    I have reviewed pertinent nursing notes, vitals, labs, and images as necessary.  I have ordered labwork to follow up on as indicated.  I have reviewed the last notes from staff over past 24 hours. I have discussed patient's care plan and test results with nursing staff, CM/SW, and other staff as appropriate.    LOS: 2 days   Lewie Chamber, MD [facility] 04/23/2023, 12:24 PM

## 2023-04-23 NOTE — Plan of Care (Signed)
  Problem: Education: Goal: Ability to describe self-care measures that may prevent or decrease complications (Diabetes Survival Skills Education) will improve Outcome: Progressing Goal: Individualized Educational Video(s) Outcome: Progressing   Problem: Coping: Goal: Ability to adjust to condition or change in health will improve Outcome: Progressing   Problem: Fluid Volume: Goal: Ability to maintain a balanced intake and output will improve Outcome: Progressing   Problem: Health Behavior/Discharge Planning: Goal: Ability to identify and utilize available resources and services will improve Outcome: Progressing Goal: Ability to manage health-related needs will improve Outcome: Progressing   Problem: Metabolic: Goal: Ability to maintain appropriate glucose levels will improve Outcome: Progressing   Problem: Nutritional: Goal: Maintenance of adequate nutrition will improve Outcome: Progressing Goal: Progress toward achieving an optimal weight will improve Outcome: Progressing   Problem: Skin Integrity: Goal: Risk for impaired skin integrity will decrease Outcome: Progressing   Problem: Tissue Perfusion: Goal: Adequacy of tissue perfusion will improve Outcome: Progressing   Problem: Education: Goal: Knowledge of General Education information will improve Description: Including pain rating scale, medication(s)/side effects and non-pharmacologic comfort measures Outcome: Progressing   Problem: Health Behavior/Discharge Planning: Goal: Ability to manage health-related needs will improve Outcome: Progressing   Problem: Clinical Measurements: Goal: Ability to maintain clinical measurements within normal limits will improve Outcome: Progressing Goal: Will remain free from infection Outcome: Progressing Goal: Diagnostic test results will improve Outcome: Progressing Goal: Respiratory complications will improve Outcome: Progressing Goal: Cardiovascular complication will  be avoided Outcome: Progressing   Problem: Activity: Goal: Risk for activity intolerance will decrease Outcome: Progressing   Problem: Nutrition: Goal: Adequate nutrition will be maintained Outcome: Progressing   Problem: Coping: Goal: Level of anxiety will decrease Outcome: Progressing   Problem: Elimination: Goal: Will not experience complications related to bowel motility Outcome: Progressing Goal: Will not experience complications related to urinary retention Outcome: Progressing   Problem: Pain Management: Goal: General experience of comfort will improve Outcome: Progressing   Problem: Safety: Goal: Ability to remain free from injury will improve Outcome: Progressing   Problem: Skin Integrity: Goal: Risk for impaired skin integrity will decrease Outcome: Progressing   Problem: Activity: Goal: Ability to tolerate increased activity will improve Outcome: Progressing   Problem: Clinical Measurements: Goal: Ability to maintain a body temperature in the normal range will improve Outcome: Progressing   Problem: Respiratory: Goal: Ability to maintain adequate ventilation will improve Outcome: Progressing Goal: Ability to maintain a clear airway will improve Outcome: Progressing   Problem: Safety: Goal: Non-violent Restraint(s) Outcome: Progressing   Problem: Education: Goal: Knowledge of disease or condition will improve Outcome: Progressing Goal: Knowledge of secondary prevention will improve (MUST DOCUMENT ALL) Outcome: Progressing Goal: Knowledge of patient specific risk factors will improve Loraine Leriche N/A or DELETE if not current risk factor) Outcome: Progressing

## 2023-04-23 NOTE — Plan of Care (Signed)
  Problem: Nutrition: Goal: Adequate nutrition will be maintained Outcome: Progressing   

## 2023-04-23 NOTE — Progress Notes (Signed)
Pt's wife called and was updated.

## 2023-04-24 ENCOUNTER — Ambulatory Visit: Payer: Self-pay | Admitting: Licensed Clinical Social Worker

## 2023-04-24 DIAGNOSIS — I2699 Other pulmonary embolism without acute cor pulmonale: Secondary | ICD-10-CM | POA: Diagnosis not present

## 2023-04-24 DIAGNOSIS — R410 Disorientation, unspecified: Secondary | ICD-10-CM

## 2023-04-24 DIAGNOSIS — R32 Unspecified urinary incontinence: Secondary | ICD-10-CM | POA: Diagnosis not present

## 2023-04-24 DIAGNOSIS — F03918 Unspecified dementia, unspecified severity, with other behavioral disturbance: Secondary | ICD-10-CM | POA: Diagnosis not present

## 2023-04-24 LAB — CULTURE, BLOOD (ROUTINE X 2)
Culture: NO GROWTH
Special Requests: ADEQUATE

## 2023-04-24 LAB — GLUCOSE, CAPILLARY
Glucose-Capillary: 229 mg/dL — ABNORMAL HIGH (ref 70–99)
Glucose-Capillary: 334 mg/dL — ABNORMAL HIGH (ref 70–99)
Glucose-Capillary: 314 mg/dL — ABNORMAL HIGH (ref 70–99)
Glucose-Capillary: 318 mg/dL — ABNORMAL HIGH (ref 70–99)

## 2023-04-24 MED ORDER — METFORMIN HCL 500 MG PO TABS
1000.0000 mg | ORAL_TABLET | Freq: Two times a day (BID) | ORAL | Status: DC
  Administered 2023-04-24 – 2023-05-03 (×18): 1000 mg via ORAL
  Filled 2023-04-24 (×18): qty 2

## 2023-04-24 MED ORDER — INSULIN ASPART 100 UNIT/ML IJ SOLN
0.0000 [IU] | Freq: Three times a day (TID) | INTRAMUSCULAR | Status: DC
  Administered 2023-04-24: 4 [IU] via SUBCUTANEOUS
  Administered 2023-04-25: 8 [IU] via SUBCUTANEOUS
  Administered 2023-04-25: 5 [IU] via SUBCUTANEOUS
  Administered 2023-04-25: 11 [IU] via SUBCUTANEOUS
  Administered 2023-04-26: 3 [IU] via SUBCUTANEOUS
  Administered 2023-04-26: 8 [IU] via SUBCUTANEOUS
  Administered 2023-04-26 – 2023-04-27 (×2): 5 [IU] via SUBCUTANEOUS
  Administered 2023-04-27 – 2023-04-28 (×5): 3 [IU] via SUBCUTANEOUS
  Administered 2023-04-29: 5 [IU] via SUBCUTANEOUS
  Administered 2023-04-29 – 2023-04-30 (×2): 3 [IU] via SUBCUTANEOUS
  Administered 2023-04-30: 5 [IU] via SUBCUTANEOUS
  Administered 2023-04-30: 2 [IU] via SUBCUTANEOUS
  Administered 2023-05-01: 5 [IU] via SUBCUTANEOUS
  Administered 2023-05-01: 2 [IU] via SUBCUTANEOUS
  Administered 2023-05-01: 3 [IU] via SUBCUTANEOUS
  Administered 2023-05-02: 5 [IU] via SUBCUTANEOUS
  Administered 2023-05-02 (×2): 3 [IU] via SUBCUTANEOUS
  Administered 2023-05-03: 5 [IU] via SUBCUTANEOUS
  Administered 2023-05-03: 3 [IU] via SUBCUTANEOUS

## 2023-04-24 MED ORDER — LINAGLIPTIN 5 MG PO TABS
5.0000 mg | ORAL_TABLET | Freq: Every day | ORAL | Status: DC
  Administered 2023-04-24 – 2023-05-03 (×10): 5 mg via ORAL
  Filled 2023-04-24 (×10): qty 1

## 2023-04-24 MED ORDER — INSULIN ASPART 100 UNIT/ML IJ SOLN
0.0000 [IU] | Freq: Every day | INTRAMUSCULAR | Status: DC
  Administered 2023-04-24: 4 [IU] via SUBCUTANEOUS
  Administered 2023-04-25 – 2023-04-26 (×2): 3 [IU] via SUBCUTANEOUS
  Administered 2023-04-30: 2 [IU] via SUBCUTANEOUS

## 2023-04-24 NOTE — TOC Progression Note (Signed)
Transition of Care [facility]) - Progression Note    Patient Details  Name: Walter [name] MRN: [id number] Date of Birth: [birth year]  [facility] of [facility]) CM/SW Contact  Eduard Roux, [place] Phone Number: 04/24/2023, 1:26 PM  Clinical Narrative:     Per chart review CIR following , SNF as back up plan  Sent message to [facility] and [name]- requesting to review for determination- waiting on response  TOC continues to follow.  Antony Blackbird, MSW, LCSW Clinical Social Worker      Barriers to Discharge: Continued Medical Work up  Expected Discharge Plan and Services In-house Referral: Clinical Social Work     Living arrangements for the past 2 months: Single Family Home                           HH Arranged:  (Active with [facility]  for PT OT SLP)           Social Determinants of Health (SDOH) Interventions SDOH Screenings   Food Insecurity: No Food Insecurity (04/20/2023)  Housing: Low Risk  (04/20/2023)  Recent Concern: Housing - High Risk (03/31/2023)  Transportation Needs: No Transportation Needs (04/20/2023)  Utilities: Not At Risk (04/20/2023)  Depression (PHQ2-9): High Risk (04/11/2023)  Financial Resource Strain: Low Risk  (05/12/2022)  Physical Activity: Sufficiently Active (05/12/2022)  Social Connections: Moderately Integrated (05/12/2022)  Stress: No Stress Concern Present (05/12/2022)  Tobacco Use: Medium Risk (04/19/2023)    Readmission Risk Interventions     No data to display

## 2023-04-24 NOTE — Progress Notes (Signed)
Progress Note    Walter [name]   XBM:[id number]  DOB: 09-Apr-1941  DOA: 04/19/2023     3 PCP: Walter Majestic, MD  Initial CC: AMS  Hospital Course: [name] is an 82 yo male with PMH chronic dementia, HTN, HLD, DM II, OSA on CPAP, BPH, PTSD/anxiety/depression who presented with altered mentation.  Note, patient recently admitted from 11/29 until 12/1.  Admitted with altered mental status in the setting of sepsis secondary to aspiration pneumonia at the bilateral bases.  AMS also believed to have a delirium component in the setting of chronic dementia.  Was treated with antibiotics with improvement.  On discharge patient was alert and oriented to self only.  Plan for outpatient neurology follow-up.   Patient did follow-up with neurology who recommended continue donepezil and continued Lexapro/Seroquel for PTSD/anxiety/depression.  Further neuropsychiatric testing to be repeated in 2025.   Patient has not returned to previous baseline prior to that admission and is unclear if he will.  However for the past 2 to 3 days prior to admission, he has had significant worsening initially with increasing weakness and now significantly increased confusion with some associated agitation. His confusion appeared similar to how it was just prior to his recent admission.  He underwent workup with imaging studies in the ER.  CT was notable for acute PE involving RUL and RLL; there was also groundglass opacities in the right lower lobe concerning for infection. He was started on heparin drip, oxygen, and antibiotics and admitted for further monitoring.  Interval History:  No events overnight. Sitting up in recliner and about to walk with PT this morning.  Appears to be a CIR candidate.   Assessment and Plan: * Acute pulmonary embolism (HCC) - CT of the chest abdomen pelvis was obtained showing segmental/subsegmental pulmonary emboli of the right upper lobe and right lower lobe. - Has reportedly had  decreased activity since last admission.  Wife reports he is very sedentary at home and has had minimal physical activity and ambulation - Does have history of GI bleeding in 2023; no drop in hemoglobin nor evidence of bleeding while on heparin drip - Okay to transition to Eliquis -Will ask pharmacy to see if price check can be obtained - echo negative for heart strain   Acute metabolic encephalopathy-resolved as of 04/23/2023 - Believed to be secondary to infection and/or pulmonary embolism in the setting of chronic presumed diagnosis of cognitive impairment/dementia - similar to previous admission earlier in the month for sepsis.  CAP (community acquired pneumonia) - GGO noted on CT - RVP negative; negative covid,flu,rsv on admission  -Continue Rocephin and azithromycin for now.  PCT noted negative x 2; mild leuks, 1% bands  Cognitive impairment - has suspected diagnosis of dementia and if following outpatient with neurology for ongoing workup - History of some delirium and agitation during last admission as well; so far remains cooperative  - Continue home donepezil  History of GI bleed - Was admitted in 2023 for GI bleeding.  Underwent EGD and oozing duodenal ulcer was noted and patient underwent cautery and clipping.  Has follow-up with GI outpatient and had been on PPI but has since stopped taking this. > Given this was over a year ago we will pursue anticoagulation for pulmonary embolism as above but will start with heparin and restart PPI. - Resume PPI therapy - Trend CBC  Pulmonary nodule - 5 mm right lower lobe pulmonary nodule is new in the interval. No follow-up needed if patient  is low-risk. - 12 mm left lower lobe pulmonary nodule, similar to prior. This was evaluated by PET-CT 10/28/2022 and found to be non hypermetabolic.  Anxiety and depression -  Mentioned in recent neurology note but not listed on problem list.  Recommended to be addressed appropriately along with  treating dementia as cognitive decline can be multifactorial. - Previously had been on Lexapro and Seroquel. 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 This lesion was also not hypermetabolic on 10/28/2022 PET imaging and was characterized as stable compared back to 01/26/2022. PET-CT recommendations indicated 2 year follow-up from that exam.  Essential hypertension - Continue home regimen  Obstructive sleep apnea - Continue nightly CPAP  Hyperlipidemia associated with type 2 diabetes mellitus - Continue statin  Diabetes mellitus type II, controlled - SSI and CBG monitoring   Old records reviewed in assessment of this patient  Antimicrobials: Azithromycin 04/19/2023 >> 04/23/2023 Rocephin 04/19/2023 >> 04/23/2023  DVT prophylaxis:   apixaban (ELIQUIS) tablet 10 mg  apixaban (ELIQUIS) tablet 5 mg   Code Status:   Code Status: Limited: Do not attempt resuscitation (DNR) -DNR-LIMITED -Do Not Intubate/DNI   Mobility Assessment (Last 72 Hours)     Mobility Assessment     Row Name 04/24/23 1117 04/24/23 1049 04/24/23 0830 04/23/23 2341 04/23/23 2339   Does patient have an order for bedrest or is patient medically unstable -- -- No - Continue assessment No - Continue assessment No - Continue assessment   What is the highest level of mobility based on the progressive mobility assessment? Level 4 (Walks with assist in room) - Balance while marching in place and cannot step forward and back - Complete Level 4 (Walks with assist in room) - Balance while marching in place and cannot step forward and back - Complete Level 4 (Walks with assist in room) - Balance while marching in place and cannot step forward and back - Complete Level 4 (Walks with assist in room) - Balance while marching in place and cannot step forward and back - Complete Level 4 (Walks with assist in room) -  Balance while marching in place and cannot step forward and back - Complete   Is the above level different from baseline mobility prior to current illness? -- -- Yes - Recommend PT order Yes - Recommend PT order Yes - Recommend PT order    Row Name 04/23/23 0800 04/23/23 0400 04/23/23 0000 04/22/23 2100 04/22/23 0800   Does patient have an order for bedrest or is patient medically unstable No - Continue assessment No - Continue assessment No - Continue assessment No - Continue assessment No - Continue assessment   What is the highest level of mobility based on the progressive mobility assessment? Level 4 (Walks with assist in room) - Balance while marching in place and cannot step forward and back - Complete Level 4 (Walks with assist in room) - Balance while marching in place and cannot step forward and back - Complete Level 4 (Walks with assist in room) - Balance while marching in place and cannot step forward and back - Complete Level 4 (Walks with assist in room) - Balance while marching in place and cannot step forward and back - Complete Level 4 (Walks with assist in room) - Balance while marching in place and cannot step forward and back - Complete   Is the above  level different from baseline mobility prior to current illness? Yes - Recommend PT order Yes - Recommend PT order Yes - Recommend PT order Yes - Recommend PT order --    Row Name 04/21/23 2100           Does patient have an order for bedrest or is patient medically unstable No - Continue assessment       What is the highest level of mobility based on the progressive mobility assessment? Level 4 (Walks with assist in room) - Balance while marching in place and cannot step forward and back - Complete                Barriers to discharge: none Disposition Plan:  rehab vs CIR Status is: Inpt  Objective: Blood pressure (!) 98/58, pulse 93, temperature 98.5 F (36.9 C), temperature source Oral, resp. rate 20, height 6' (1.829  m), weight 76.8 kg, SpO2 95%.  Examination:  Physical Exam Constitutional:      General: He is not in acute distress. HENT:     Head: Normocephalic and atraumatic.     Mouth/Throat:     Mouth: Mucous membranes are moist.  Eyes:     Extraocular Movements: Extraocular movements intact.  Cardiovascular:     Rate and Rhythm: Normal rate and regular rhythm.  Pulmonary:     Effort: Pulmonary effort is normal. No respiratory distress.     Breath sounds: Normal breath sounds. No wheezing.  Abdominal:     General: Bowel sounds are normal. There is no distension.     Palpations: Abdomen is soft.     Tenderness: There is no abdominal tenderness.  Musculoskeletal:        General: Normal range of motion.     Cervical back: Normal range of motion and neck supple.  Skin:    General: Skin is warm and dry.  Neurological:     Mental Status: He is disoriented.     Cranial Nerves: Facial asymmetry (chronic left facial droop (confirmed by wife)) present.      Consultants:    Procedures:    Data Reviewed: Results for orders placed or performed during the hospital encounter of 04/19/23 (from the past 24 hours)  Glucose, capillary     Status: Abnormal   Collection Time: 04/23/23  4:14 PM  Result Value Ref Range   Glucose-Capillary 252 (H) 70 - 99 mg/dL  Glucose, capillary     Status: Abnormal   Collection Time: 04/23/23  9:10 PM  Result Value Ref Range   Glucose-Capillary 260 (H) 70 - 99 mg/dL  Glucose, capillary     Status: Abnormal   Collection Time: 04/24/23  7:26 AM  Result Value Ref Range   Glucose-Capillary 229 (H) 70 - 99 mg/dL  Glucose, capillary     Status: Abnormal   Collection Time: 04/24/23 11:33 AM  Result Value Ref Range   Glucose-Capillary 334 (H) 70 - 99 mg/dL    I have reviewed pertinent nursing notes, vitals, labs, and images as necessary. I have ordered labwork to follow up on as indicated.  I have reviewed the last notes from staff over past 24 hours. I have  discussed patient's care plan and test results with nursing staff, CM/SW, and other staff as appropriate.    LOS: 3 days   Lewie Chamber, MD [facility] 04/24/2023, 3:21 PM

## 2023-04-24 NOTE — Plan of Care (Signed)
  Problem: Coping: Goal: Ability to adjust to condition or change in health will improve Outcome: Progressing   Problem: Nutritional: Goal: Maintenance of adequate nutrition will improve Outcome: Progressing   Problem: Skin Integrity: Goal: Risk for impaired skin integrity will decrease Outcome: Progressing   

## 2023-04-24 NOTE — Plan of Care (Signed)
  Problem: Coping: Goal: Ability to adjust to condition or change in health will improve Outcome: Progressing   

## 2023-04-24 NOTE — Consult Note (Signed)
Physical Medicine and Rehabilitation Consult Reason for Consult: Evaluate appropriateness for Inpatient Rehab Referring Physician: Dr. Frederick Peers    HPI: Walter [name] is a 82 y.o. 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 . They were admitted to [facility] on 04/19/2023 for altered mental status.  Of note, he had recent hospitalization from 11-29 to 12-1 due to sepsis secondary to aspiration pneumonia, developed delirium on top of chronic dementia during his hospital stay, was treated with antibiotics and discharged at [facility] x 1.  Outpatient neurology recommended continuing his current medications and repeat neuropsych testing, however patient did not return to his baseline prior to admission and had increased weakness with confusion and some agitation for 2 to 3 days prior to representing to the ER.  ED workup notable for borderline hypoxia, blood glucose 206, borderline leukocytosis 10.7, chest x-ray with left basilar atelectasis, and CT chest/abdomen/pelvis with right upper  and lower lobe subsegmental pulmonary emboli and possible right lower lobe infarct.  Patient was started on vancomycin and Zosyn as well as Toradol and 1 L of IV fluids, and admitted to the hospital for further workup of PE.  He was started on a heparin drip, somewhat complicated by history of GI bleed in 2023, but with no bleeding this hospitalization.  Antibiotics were switched to Rocephin and azithromycin for community-acquired pneumonia.  Hospitalization was complicated by altered mental status on top of dementia, anxiety and depression, new findings of pulmonary nodule and pancreatic cyst, diabetes, and hypertension.  PM&R was consulted to evaluate appropriateness for IPR admission.   Per therapy notes and patient's daughter, prior to his November hospitalization, patient was operating an independent level for most ADLs and independent level for mobility.  He was managing his own medication, and was driving.  After his hospitalization in November, patient needed a shower chair and a walker for ambulation, and they had a handlebar installed above the 2 steps at their garage entrance.  He lives in a private residence with his spouse, who cannot provide physical assistance but does perform ADLs like cooking/cleaning.  They live in a two-story home with a first-floor set up.  Additional supports include their daughter who lives nearby checks in regularly but works during the day.  Goals are for patient to be supervision level to modified independent for ambulation and transfers, as well as dressing and bathing.    Discussed with family the complex factors for surrounding delirium on top of dementia, and possibility of unpredictable/prolonged course of confusion; daughter is understanding of this and states he does not need to be back to cognitive baseline prior to discharge home.  Daughter does note that, the day prior to patient's rehospitalization, he seemed to cognitively clear and was doing much better  overall.  Review of Systems  Constitutional:  Negative for chills and fever.  HENT:  Negative for hearing loss.   Eyes:  Negative for blurred vision and double vision.  Respiratory:  Negative for cough and shortness of breath.   Cardiovascular:  Negative for chest pain and palpitations.  Gastrointestinal:  Negative for nausea and vomiting.  Genitourinary:  Negative for dysuria and urgency.  Musculoskeletal:  Positive for falls. Negative for joint pain and myalgias.  Skin:  Negative for rash.  Neurological:  Positive for tremors and weakness. Negative for dizziness, sensory change and headaches.  Psychiatric/Behavioral:  Positive for hallucinations and memory loss. The patient has insomnia.    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 All started after a hemorrhoid surgery and later with urinary retention. Patient at one point was septic from urinary issues.     Diabetes mellitus type II, controlled 04/23/2007    Metformin 1g BID, amaryl 4mg --> 8mg , had to add [place] back  Never took victoza.   Januvia-lethargic and dizzy in past. Retrial ok; could change to victoza if needed Serious UTI history- likely avoid sglt2 inhibtor   Duodenal ulcer hemorrhage    Essential hypertension 04/23/2007   Amlodipine 5mg , telmisartan 40mg       Hallux valgus of right foot 03/26/2014   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   History of colon cancer    Tubular adenoma of colon; s/p colectomy 1992    History of dysplastic nevus    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   History of shingles    History of skin cancer    Hyperlipidemia associated with  type 2 diabetes mellitus 04/23/2007   Atorvastatin 20mg  once weekly     Iritis 12/17/2008   Shingles 2010    Mild neurocognitive disorder, concerns for Alzheimer's disease 12/23/2019   Previously [facility] diagnosed [name], PHd  neurocognitive eval. Follows with Dr. 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   Pulmonary nodule 02/04/2022   Sepsis secondary to UTI 03/29/2015   Severe sepsis (HCC) 03/31/2023   Past Surgical History:  Procedure Laterality Date   AMPUTATION TOE Right 09/14/2016   Procedure: RIGHT 2ND TOE/RAY AMPUTATION;  Surgeon: Tarry Kos, MD;  Location: [facility];  Service: Orthopedics;  Laterality: Right;   APPENDECTOMY  05/03/1947   BIOPSY  01/28/2022   Procedure: BIOPSY;  Surgeon: Sherrilyn Rist, MD;  Location: [facility] ENDOSCOPY;  Service: Gastroenterology;;   COLECTOMY  05/02/1990   CYSTOSCOPY N/A 06/21/2015   Procedure: CYSTOSCOPY, CLOT EVACUATION, AND CAUTERIZATION OF PROSTATE FOSSA;  Surgeon: Jethro Bolus, MD;  Location: [facility];  Service: Urology;  Laterality: N/A;   ESOPHAGOGASTRODUODENOSCOPY (EGD) WITH PROPOFOL N/A 01/26/2022   Procedure: ESOPHAGOGASTRODUODENOSCOPY (EGD) WITH PROPOFOL;  Surgeon: Sherrilyn Rist, MD;  Location: [facility] ENDOSCOPY;  Service: Gastroenterology;  Laterality: N/A;   ESOPHAGOGASTRODUODENOSCOPY (EGD) WITH PROPOFOL N/A 01/28/2022   Procedure: ESOPHAGOGASTRODUODENOSCOPY (EGD) WITH PROPOFOL;  Surgeon: Sherrilyn Rist, MD;  Location[facility]COPY;  Service: Gastroenterology;  Laterality: N/A;   HEMORRHOID SURGERY     HEMOSTASIS CLIP PLACEMENT  01/26/2022  Procedure: HEMOSTASIS CLIP PLACEMENT;  Surgeon: Sherrilyn Rist, MD;  Location: [facility] ENDOSCOPY;  Service: Gastroenterology;;   HEMOSTASIS CONTROL  01/26/2022   Procedure: HEMOSTASIS CONTROL;  Surgeon: Sherrilyn Rist, MD;  Location: [facility]  ENDOSCOPY;  Service: Gastroenterology;;   HOT HEMOSTASIS N/A 01/26/2022   Procedure: HOT HEMOSTASIS (ARGON PLASMA COAGULATION/BICAP);  Surgeon: Sherrilyn Rist, MD;  Location: [facility] ENDOSCOPY;  Service: Gastroenterology;  Laterality: N/A;   MOLE REMOVAL  2022   hand   SCLEROTHERAPY  01/26/2022   Procedure: SCLEROTHERAPY;  Surgeon: Sherrilyn Rist, MD;  Location: [facility] ENDOSCOPY;  Service: Gastroenterology;;   TOE SURGERY  04/2020   TRANSURETHRAL RESECTION OF PROSTATE N/A 05/23/2015   Procedure: TRANSURETHRAL RESECTION OF THE PROSTATE (TURP);  Surgeon: Bjorn Pippin, MD;  Location: [facility];  Service: Urology;  Laterality: N/A;   Family History  Problem Relation Age of Onset   Alzheimer's disease Mother    Colon cancer Father 54   Diabetes Father        type I   Colon cancer Paternal Grandmother 48   Rectal cancer Maternal Uncle    Cancer Other        colon/fhx   Esophageal cancer Neg Hx    Stomach cancer Neg Hx    Social History:  reports that he has quit smoking. His smoking use included pipe and cigarettes. He has a 2.5 pack-year smoking history. He has never used smokeless tobacco. He reports current alcohol use of about 2.0 standard drinks of alcohol per week. He reports that he does not use drugs. Allergies:  Allergies  Allergen Reactions   Bactrim [Sulfamethoxazole-Trimethoprim] Nausea And Vomiting    Pt has chills and fevers also.   Medications Prior to Admission  Medication Sig Dispense Refill   amLODipine (NORVASC) 5 MG tablet TAKE 1 TABLET BY MOUTH EVERY DAY 90 tablet 3   [employer] STOMACH RELIEF 262 MG chewable tablet Chew 2 tablets by mouth 4 (four) times daily.     donepezil (ARICEPT) 10 MG tablet TAKE 1 TABLET BY MOUTH EVERYDAY AT BEDTIME (Patient taking differently: Take 10 mg by mouth at bedtime.) 90 tablet 1   ferrous sulfate 325 (65 FE) MG tablet Take 1 tablet (325 mg total) by mouth 2 (two) times daily with a meal.     glipiZIDE (GLUCOTROL) 10 MG tablet TAKE 1 TABLET (10 MG  TOTAL) BY MOUTH TWICE A DAY BEFORE A MEAL 180 tablet 3   Glucagon (GVOKE HYPOPEN 2-PACK) 0.5 MG/0.1ML SOAJ Inject 0.5 mg into the skin daily as needed (for low blood sugar). 0.2 mL 1   metFORMIN (GLUCOPHAGE) 1000 MG tablet TAKE 1 TABLET BY MOUTH TWICE A DAY WITH A MEAL 180 tablet 3   rosuvastatin (CRESTOR) 20 MG tablet TAKE 1 TABLET BY MOUTH ONE TIME PER WEEK 13 tablet 3   sitaGLIPtin (JANUVIA) 100 MG tablet Take 1 tablet (100 mg total) by mouth daily. 90 tablet 3   telmisartan (MICARDIS) 80 MG tablet Take 1 tablet (80 mg total) by mouth daily. 90 tablet 3   ONETOUCH ULTRA test strip USE ONCE DAILY AS INSTRUCTED 100 strip 3   QUEtiapine (SEROQUEL) 25 MG tablet Take 1 tablet (25 mg total) by mouth at bedtime. (Patient not taking: Reported on 04/19/2023) 30 tablet 2    Home: Home Living Family/patient expects to be discharged to:: Private residence Living Arrangements: Spouse/significant other, Children (daughters comes over PRN) Available Help at Discharge: Family, Available PRN/intermittently Type of Home: House Home  Access: Stairs to enter [employer] of Steps: 2 Home Layout: Two level Alternate Level Stairs-Number of Steps: chairlift Bathroom Shower/Tub: Engineer, manufacturing systems: Standard Bathroom Accessibility: Yes Additional Comments: pt questionable historian; no answer from spouse when attempted to call.  Functional History: Prior Function Prior Level of Function : Independent/Modified Independent, Patient poor historian/Family not available Mobility Comments: ind ADLs Comments: reports ind Functional Status:  Mobility: Bed Mobility Overal bed mobility: Needs Assistance Bed Mobility: Supine to Sit Supine to sit: HOB elevated, Used rails, Min assist Sit to supine: Min assist, HOB elevated, Used rails General bed mobility comments: Pt sitting up in recliner Transfers Overall transfer level: Needs assistance Equipment used: Rolling walker (2  wheels) Transfers: Sit to/from Stand Sit to Stand: Min assist, Mod assist General transfer comment: Initial standing with mod assist to power up and correct posterior lean. Worked on repeated sit to stand with shoes on with pt improving to min assist. Verbal cues for hand placement Ambulation/Gait Ambulation/Gait assistance: Min assist, Mod assist Gait Distance (Feet): 100 Feet Assistive device: Rolling walker (2 wheels) Gait Pattern/deviations: Step-through pattern, Decreased stride length, Drifts right/left, Decreased step length - left, Trunk flexed, Knee flexed in stance - right, Knee flexed in stance - left General Gait Details: For straight line ambulation pt min assist for balance and support. Frequent verbal cues to stand more erect. With any turning or distraction pt up to mod assist for balance. Verbal cues to stay closer to walker. Gait velocity: decr Gait velocity interpretation: <1.8 ft/sec, indicate of risk for recurrent falls    ADL: ADL Overall ADL's : Needs assistance/impaired Eating/Feeding: Set up Grooming: Supervision/safety, Sitting, Wash/dry face Grooming Details (indicate cue type and reason): pt reported preference to sit at sink to perform task. Upper Body Bathing: Minimal assistance, Sitting Lower Body Bathing: Sitting/lateral leans, Sit to/from stand, +2 for safety/equipment, Moderate assistance, +2 for physical assistance Upper Body Dressing : Minimal assistance, Sitting Lower Body Dressing: Moderate assistance, Sit to/from stand Toilet Transfer: Minimal assistance, Moderate assistance, Rolling walker (2 wheels) Toilet Transfer Details (indicate cue type and reason): simulated in turning back to bed with RW- poor DME mgmt, max cues and hands on assist to turn pt back to bed Toileting- Clothing Manipulation and Hygiene: Moderate assistance, Sit to/from stand Functional mobility during ADLs: Minimal assistance, Moderate assistance, Rolling walker (2  wheels) General ADL Comments: Pt min/mod for balance when standing, posterior lean and poor safety awareness. Pt not able to manage gown for toileting, difficulty with perineal hygiene, mod A for cleanliness. Pt stands bent over when wiping almost falling forward requiring physical assist for toileting.  Cognition: Cognition Overall Cognitive Status: History of cognitive impairments - at baseline Orientation Level: Oriented to person, Disoriented to place, Disoriented to time, Disoriented to situation Cognition Arousal: Alert Behavior During Therapy: Impulsive Overall Cognitive Status: History of cognitive impairments - at baseline General Comments: history of dementia; disoriented to time, place, and situation; decr safety awareness; decr awareness of deficits; poor problem solving  Blood pressure (!) 98/58, pulse 93, temperature 98.5 F (36.9 C), temperature source Oral, resp. rate 20, height 6' (1.829 m), weight 76.8 kg, SpO2 95%. Physical Exam  Results for orders placed or performed during the hospital encounter of 04/19/23 (from the past 24 hours)  Glucose, capillary     Status: Abnormal   Collection Time: 04/23/23  4:14 PM  Result Value Ref Range   Glucose-Capillary 252 (H) 70 - 99 mg/dL  Glucose, capillary  Status: Abnormal   Collection Time: 04/23/23  9:10 PM  Result Value Ref Range   Glucose-Capillary 260 (H) 70 - 99 mg/dL  Glucose, capillary     Status: Abnormal   Collection Time: 04/24/23  7:26 AM  Result Value Ref Range   Glucose-Capillary 229 (H) 70 - 99 mg/dL  Glucose, capillary     Status: Abnormal   Collection Time: 04/24/23 11:33 AM  Result Value Ref Range   Glucose-Capillary 334 (H) 70 - 99 mg/dL   No results found.  Assessment/Plan: Diagnosis: Debility secondary to delirium from prolonged hospitalization/pulmonary embolism, complicated by overlying dementia Does the need for close, 24 hr/day medical supervision in concert with the patient's rehab needs  make it unreasonable for this patient to be served in a less intensive setting? Yes Co-Morbidities requiring supervision/potential complications: Delirium/dementia with sundowning, insomnia, PE transitioning to oral AC with history of GI bleed, bowel and bladder incontinence, hypotension, pneumonia, and diabetes. Due to bladder management, bowel management, safety, skin/wound care, disease management, medication administration, and patient education, does the patient require 24 hr/day rehab nursing? Yes Does the patient require coordinated care of a physician, rehab nurse, therapy disciplines of PT, OT, and SLP to address physical and functional deficits in the context of the above medical diagnosis(es)? Yes Addressing deficits in the following areas: balance, endurance, locomotion, strength, transferring, bowel/bladder control, bathing, dressing, feeding, grooming, toileting, cognition, and psychosocial support Can the patient actively participate in an intensive therapy program of at least 3 hrs of therapy per day at least 5 days per week? Yes The potential for patient to make measurable gains while on inpatient rehab is good Anticipated functional outcomes upon discharge from inpatient rehab are modified independent and supervision  with PT, modified independent and supervision with OT, min assist with SLP. Estimated rehab length of stay to reach the above functional goals is: 10 to 14 days Anticipated discharge destination: Home Overall Rehab/Functional Prognosis: good  POST ACUTE RECOMMENDATIONS: This patient's condition is appropriate for continued rehabilitative care in the following setting: CIR Patient has agreed to participate in recommended program. Yes Note that insurance prior authorization may be required for reimbursement for recommended care.  Comment: Walter [name] is a good inpatient rehab candidate.  He has had multiple recent hospitalizations with pneumonia, now with overlying  pulmonary embolism, complicated by delirium on top of pre-existing mild cognitive deficit/dementia.  He has a 1 floor set up with 24/7 support from his wife and intermittent support from his daughter, who required patient is ambulating and performing ADLs at a supervision level for home discharge.  Patient is with improving alertness and participating in therapies, will be able to handle 3 hours of therapy a day 5 days/week.   MEDICAL RECOMMENDATIONS: Delirium precautions with sleep/ wake cycle promotion by limiting nighttime interruptions and frequent reorientation during the day, avoidance of naps, and avoidance of sedating medications Recommend limitations of lines, tubes, and drains, including discontinuation of condom catheter and timed toileting to promote continence. Now that p.o. intakes are 50 to 75%, recommend initiating long-acting insulin as blood sugars have remained in the 2-300s; agree with diabetic coordinator on this Patient could benefit from geriatrics consultation for prolonged delirium and dementia, if available Recommend neuropsychiatric consultation for memory/cognitive evaluation; can perform inpatient rehab   I have personally performed a face to face diagnostic evaluation of this patient. Additionally, I have examined the patient's medical record including any pertinent labs and radiographic images. If the physician assistant has documented in this  note, I have reviewed and edited or otherwise concur with the physician assistant's documentation.  Thanks,  Angelina Sheriff, DO 04/24/2023

## 2023-04-24 NOTE — Progress Notes (Signed)
Physical Therapy Treatment Patient Details Name: Walter [name] MRN: [id number] DOB: 1941/01/04 Today's Date: 04/24/2023   History of Present Illness Patient is an 82 y.o. male presented to ED with AMS. Pt recently admitted 11/29-12/1 for AMS due to sepsis secondary to PNA. CT of the chest abdomen and pelvis with contrast demonstrated a segmental/subsegmental pulmonary emboli at right upper lobe and right lower lobe. PMH significant of hypertension, hyperlipidemia, diabetes, OSA on CPAP, BPH, aspiration, PTSD, anxiety, depression, dementia, pancreatic cyst.    PT Comments  Pt making steady progress but continues to need assist for all mobility due to poor balance, decr cognition, and decr strength. Prior to admission was amb independently. Feel pt will continue to progress with further therapy. Patient will benefit from intensive inpatient follow up therapy, >3 hours/day     If plan is discharge home, recommend the following: A lot of help with walking and/or transfers;A lot of help with bathing/dressing/bathroom;Assist for transportation;Help with stairs or ramp for entrance;Assistance with cooking/housework   Can travel by private vehicle        Equipment Recommendations  Rolling walker (2 wheels)    Recommendations for Other Services       Precautions / Restrictions Precautions Precautions: Fall Precaution Comments: R toe amputations affecting balance; does better with shoes Restrictions Weight Bearing Restrictions Per Provider Order: No     Mobility  Bed Mobility               General bed mobility comments: Pt sitting up in recliner    Transfers Overall transfer level: Needs assistance Equipment used: Rolling walker (2 wheels) Transfers: Sit to/from Stand Sit to Stand: Min assist, Mod assist           General transfer comment: Initial standing with mod assist to power up and correct posterior lean. Worked on repeated sit to stand with shoes on with pt improving to  min assist. Verbal cues for hand placement    Ambulation/Gait Ambulation/Gait assistance: Min assist, Mod assist Gait Distance (Feet): 100 Feet Assistive device: Rolling walker (2 wheels) Gait Pattern/deviations: Step-through pattern, Decreased stride length, Drifts right/left, Decreased step length - left, Trunk flexed, Knee flexed in stance - right, Knee flexed in stance - left Gait velocity: decr Gait velocity interpretation: <1.8 ft/sec, indicate of risk for recurrent falls   General Gait Details: For straight line ambulation pt min assist for balance and support. Frequent verbal cues to stand more erect. With any turning or distraction pt up to mod assist for balance. Verbal cues to stay closer to walker.   Stairs             Wheelchair Mobility     Tilt Bed    Modified Rankin (Stroke Patients Only)       Balance Overall balance assessment: Needs assistance Sitting-balance support: Feet supported, Bilateral upper extremity supported Sitting balance-Leahy Scale: Poor Sitting balance - Comments: UE support Postural control: Posterior lean Standing balance support: Bilateral upper extremity supported, During functional activity, Single extremity supported, Reliant on assistive device for balance Standing balance-Leahy Scale: Poor Standing balance comment: UE support and min assist for static standing. Posterior bias                            Cognition Arousal: Alert Behavior During Therapy: Impulsive Overall Cognitive Status: History of cognitive impairments - at baseline  General Comments: history of dementia; disoriented to time, place, and situation; decr safety awareness; decr awareness of deficits; poor problem solving        Exercises Other Exercises Other Exercises: Repeated sit to stand x 5 Other Exercises: Forward/Backward stepping with BUE support x 10 Other Exercises: Forward/Backward  stepping with single UE support x 5    General Comments        Pertinent Vitals/Pain Pain Assessment Pain Assessment: No/denies pain    Home Living                          Prior Function            PT Goals (current goals can now be found in the care plan section) Progress towards PT goals: Progressing toward goals    Frequency    Min 1X/week      PT Plan      Co-evaluation              AM-PAC PT "6 Clicks" Mobility   Outcome Measure  Help needed turning from your back to your side while in a flat bed without using bedrails?: A Little Help needed moving from lying on your back to sitting on the side of a flat bed without using bedrails?: A Little Help needed moving to and from a bed to a chair (including a wheelchair)?: A Lot Help needed standing up from a chair using your arms (e.g., wheelchair or bedside chair)?: A Lot Help needed to walk in hospital room?: A Lot Help needed climbing 3-5 steps with a railing? : Total 6 Click Score: 13    End of Session Equipment Utilized During Treatment: Gait belt Activity Tolerance: Patient tolerated treatment well Patient left: in chair;with call bell/phone within reach;with chair alarm set Nurse Communication: Mobility status PT Visit Diagnosis: Unsteadiness on feet (R26.81);Other abnormalities of gait and mobility (R26.89);Muscle weakness (generalized) (M62.81);Difficulty in walking, not elsewhere classified (R26.2)     Time: 0865-7846 PT Time Calculation (min) (ACUTE ONLY): 28 min  Charges:    $Gait Training: 8-22 mins $Therapeutic Exercise: 8-22 mins PT General Charges $$ ACUTE PT VISIT: 1 Visit                     [facility] PT Acute Rehabilitation Services Office [phone]    Angelina Ok [facility] 04/24/2023, 12:03 PM

## 2023-04-24 NOTE — Progress Notes (Signed)
Occupational Therapy Treatment Patient Details Name: Walter [name] MRN: [id number] DOB: Mar 22, 1941 Today's Date: 04/24/2023   History of present illness Patient is an 82 y.o. male presented to ED with AMS. Pt recently admitted 11/29-12/1 for AMS due to sepsis secondary to PNA. CT of the chest abdomen and pelvis with contrast demonstrated a segmental/subsegmental pulmonary emboli at right upper lobe and right lower lobe. PMH significant of hypertension, hyperlipidemia, diabetes, OSA on CPAP, BPH, aspiration, PTSD, anxiety, depression, dementia, pancreatic cyst.   OT comments  Pt able to fully participate in therapy, good strength/endurance for activities. Pt A/Ox1, poor safety awareness, not aware of deficits, Pt has posterior and R lateral lean with sitting, min A to maintain balance on EOB. Pt min-mod for static standing balance due to posterior lean. Pt able to ambulate using RW with min A. Pt requires mod A to assist with toileting hygiene and clothing management, and assist with UB/LB dressing. Pt would benefit from postacute intensive rehab >3hrs/day to improve to safe, functional level prior to returning home, will continue to see acutely.       If plan is discharge home, recommend the following:  A lot of help with walking and/or transfers;A lot of help with bathing/dressing/bathroom   Equipment Recommendations  Wheelchair cushion (measurements OT);Wheelchair (measurements OT)    Recommendations for Other Services      Precautions / Restrictions Precautions Precautions: Fall Precaution Comments: R toe amputations effecting balance; consider using his shoes Restrictions Weight Bearing Restrictions Per Provider Order: No       Mobility Bed Mobility Overal bed mobility: Needs Assistance Bed Mobility: Supine to Sit     Supine to sit: HOB elevated, Used rails, Min assist     General bed mobility comments: min A to power sitting up from supine. Pt has poor balance and R  lateral/posterior lean with sitting.    Transfers Overall transfer level: Needs assistance Equipment used: Rolling walker (2 wheels) Transfers: Sit to/from Stand Sit to Stand: Min assist, Mod assist           General transfer comment: min/mod A for power sts and maintaining balance.     Balance Overall balance assessment: Needs assistance Sitting-balance support: Feet supported, Bilateral upper extremity supported, Single extremity supported, No upper extremity supported Sitting balance-Leahy Scale: Poor Sitting balance - Comments: R lateral lean, posterior lean, poor problem solving, does not scoot to EOB to improve balance. Postural control: Right lateral lean, Posterior lean Standing balance support: Bilateral upper extremity supported, During functional activity, Reliant on assistive device for balance Standing balance-Leahy Scale: Poor Standing balance comment: posterior lean, R lateral lean, requires therapist support to maintain balance with RW.                           ADL either performed or assessed with clinical judgement   ADL Overall ADL's : Needs assistance/impaired Eating/Feeding: Set up   Grooming: Supervision/safety;Sitting;Wash/dry face           Upper Body Dressing : Minimal assistance;Sitting   Lower Body Dressing: Moderate assistance;Sit to/from stand   Toilet Transfer: Minimal assistance;Moderate assistance;Rolling walker (2 wheels)   Toileting- Clothing Manipulation and Hygiene: Moderate assistance;Sit to/from stand       Functional mobility during ADLs: Minimal assistance;Moderate assistance;Rolling walker (2 wheels) General ADL Comments: Pt min/mod for balance when standing, posterior lean and poor safety awareness. Pt not able to manage gown for toileting, difficulty with perineal hygiene, mod A for cleanliness.  Pt stands bent over when wiping almost falling forward requiring physical assist for toileting.    Extremity/Trunk  Assessment Upper Extremity Assessment Upper Extremity Assessment: Overall WFL for tasks assessed            Vision       Perception     Praxis      Cognition Arousal: Alert Behavior During Therapy: Impulsive Overall Cognitive Status: History of cognitive impairments - at baseline                                 General Comments: history of dementia, not oriented to date or situation, unsure if he is in hospital. Pt not aware of deficits with balance/strength, and has difficulty problem solving.        Exercises      Shoulder Instructions       General Comments      Pertinent Vitals/ Pain       Pain Assessment Pain Assessment: No/denies pain  Home Living                                          Prior Functioning/Environment              Frequency  Min 1X/week        Progress Toward Goals  OT Goals(current goals can now be found in the care plan section)  Progress towards OT goals: Progressing toward goals  Acute Rehab OT Goals Patient Stated Goal: not able to participate in goal setting OT Goal Formulation: Patient unable to participate in goal setting Time For Goal Achievement: 05/04/23 Potential to Achieve Goals: Fair ADL Goals Pt Will Perform Grooming: with contact guard assist;standing Pt Will Perform Upper Body Bathing: with supervision;sitting Pt Will Perform Lower Body Bathing: with min assist;sitting/lateral leans;sit to/from stand Pt Will Perform Upper Body Dressing: with supervision Pt Will Perform Lower Body Dressing: with supervision Pt Will Transfer to Toilet: with min assist;ambulating Pt Will Perform Tub/Shower Transfer: with supervision  Plan      Co-evaluation                 AM-PAC OT "6 Clicks" Daily Activity     Outcome Measure   Help from another person eating meals?: A Little Help from another person taking care of personal grooming?: A Little Help from another person  toileting, which includes using toliet, bedpan, or urinal?: A Lot Help from another person bathing (including washing, rinsing, drying)?: A Little Help from another person to put on and taking off regular upper body clothing?: A Little Help from another person to put on and taking off regular lower body clothing?: A Lot 6 Click Score: 16    End of Session Equipment Utilized During Treatment: Gait belt;Rolling walker (2 wheels)  OT Visit Diagnosis: Unsteadiness on feet (R26.81);Other abnormalities of gait and mobility (R26.89);Muscle weakness (generalized) (M62.81)   Activity Tolerance Patient tolerated treatment well   Patient Left in chair;with call bell/phone within reach;with chair alarm set;Other (comment) (with PT)   Nurse Communication Mobility status        Time: 0981-1914 OT Time Calculation (min): 26 min  Charges: OT General Charges $OT Visit: 1 Visit OT Treatments $Self Care/Home Management : 23-37 mins  [place], OTR/L   Alexis Goodell 04/24/2023, 10:57 AM

## 2023-04-24 NOTE — Inpatient Diabetes Management (Signed)
Inpatient Diabetes Program Recommendations  AACE/ADA: New Consensus Statement on Inpatient Glycemic Control   Target Ranges:  Prepandial:   less than 140 mg/dL      Peak postprandial:   less than 180 mg/dL (1-2 hours)      Critically ill patients:  140 - 180 mg/dL    Latest Reference Range & Units 04/23/23 08:15 04/23/23 11:51 04/23/23 16:14 04/23/23 21:10 04/24/23 07:26 04/24/23 11:33  Glucose-Capillary 70 - 99 mg/dL 161 (H) 096 (H) 045 (H) 260 (H) 229 (H) 334 (H)   Review of Glycemic Control  Diabetes history: DM2 Outpatient Diabetes medications: Glipizide 10 mg BID, Januvia 100 mg daily Current orders for Inpatient glycemic control: Novolog 0-9 units TID with meals  Inpatient Diabetes Program Recommendations:    Insulin: Please consider ordering Semglee 5 units Q24H.  Thanks, Orlando Penner, RN, MSN, CDCES Diabetes Coordinator Inpatient Diabetes Program [phone] (Team Pager from 8am to 5pm)\

## 2023-04-25 ENCOUNTER — Encounter: Payer: Medicare Other | Admitting: Gastroenterology

## 2023-04-25 ENCOUNTER — Other Ambulatory Visit: Payer: Self-pay

## 2023-04-25 DIAGNOSIS — I2699 Other pulmonary embolism without acute cor pulmonale: Secondary | ICD-10-CM | POA: Diagnosis not present

## 2023-04-25 DIAGNOSIS — J189 Pneumonia, unspecified organism: Secondary | ICD-10-CM | POA: Diagnosis not present

## 2023-04-25 DIAGNOSIS — R4189 Other symptoms and signs involving cognitive functions and awareness: Secondary | ICD-10-CM | POA: Diagnosis not present

## 2023-04-25 DIAGNOSIS — G9341 Metabolic encephalopathy: Secondary | ICD-10-CM | POA: Diagnosis not present

## 2023-04-25 LAB — GLUCOSE, CAPILLARY
Glucose-Capillary: 302 mg/dL — ABNORMAL HIGH (ref 70–99)
Glucose-Capillary: 293 mg/dL — ABNORMAL HIGH (ref 70–99)
Glucose-Capillary: 224 mg/dL — ABNORMAL HIGH (ref 70–99)
Glucose-Capillary: 251 mg/dL — ABNORMAL HIGH (ref 70–99)

## 2023-04-25 LAB — CULTURE, BLOOD (ROUTINE X 2): Culture: NO GROWTH

## 2023-04-25 NOTE — Progress Notes (Signed)
Inpatient Rehabilitation Admissions Coordinator  I await insurance determination for possible CIR admit. I spoke with wife and dtr by phone to review estimated cost of care if cir approved. Met with patient at bedside and he looks well.  Ottie Glazier, RN, MSN Rehab Admissions Coordinator [phone] 04/25/2023 12:04 PM

## 2023-04-25 NOTE — Plan of Care (Signed)
  Problem: Education: Goal: Ability to describe self-care measures that may prevent or decrease complications (Diabetes Survival Skills Education) will improve Outcome: Progressing Goal: Individualized Educational Video(s) Outcome: Progressing   Problem: Coping: Goal: Ability to adjust to condition or change in health will improve Outcome: Progressing   Problem: Fluid Volume: Goal: Ability to maintain a balanced intake and output will improve Outcome: Progressing   Problem: Health Behavior/Discharge Planning: Goal: Ability to identify and utilize available resources and services will improve Outcome: Progressing Goal: Ability to manage health-related needs will improve Outcome: Progressing   Problem: Metabolic: Goal: Ability to maintain appropriate glucose levels will improve Outcome: Progressing   Problem: Nutritional: Goal: Maintenance of adequate nutrition will improve Outcome: Progressing Goal: Progress toward achieving an optimal weight will improve Outcome: Progressing   Problem: Skin Integrity: Goal: Risk for impaired skin integrity will decrease Outcome: Progressing   Problem: Tissue Perfusion: Goal: Adequacy of tissue perfusion will improve Outcome: Progressing   Problem: Education: Goal: Knowledge of General Education information will improve Description: Including pain rating scale, medication(s)/side effects and non-pharmacologic comfort measures Outcome: Progressing   Problem: Health Behavior/Discharge Planning: Goal: Ability to manage health-related needs will improve Outcome: Progressing   Problem: Clinical Measurements: Goal: Ability to maintain clinical measurements within normal limits will improve Outcome: Progressing Goal: Will remain free from infection Outcome: Progressing Goal: Diagnostic test results will improve Outcome: Progressing Goal: Respiratory complications will improve Outcome: Progressing Goal: Cardiovascular complication will  be avoided Outcome: Progressing   Problem: Activity: Goal: Risk for activity intolerance will decrease Outcome: Progressing   Problem: Nutrition: Goal: Adequate nutrition will be maintained Outcome: Progressing   Problem: Coping: Goal: Level of anxiety will decrease Outcome: Progressing   Problem: Elimination: Goal: Will not experience complications related to bowel motility Outcome: Progressing Goal: Will not experience complications related to urinary retention Outcome: Progressing   Problem: Pain Management: Goal: General experience of comfort will improve Outcome: Progressing   Problem: Safety: Goal: Ability to remain free from injury will improve Outcome: Progressing   Problem: Skin Integrity: Goal: Risk for impaired skin integrity will decrease Outcome: Progressing   Problem: Activity: Goal: Ability to tolerate increased activity will improve Outcome: Progressing   Problem: Clinical Measurements: Goal: Ability to maintain a body temperature in the normal range will improve Outcome: Progressing   Problem: Respiratory: Goal: Ability to maintain adequate ventilation will improve Outcome: Progressing Goal: Ability to maintain a clear airway will improve Outcome: Progressing   Problem: Safety: Goal: Non-violent Restraint(s) Outcome: Progressing   Problem: Education: Goal: Knowledge of disease or condition will improve Outcome: Progressing Goal: Knowledge of secondary prevention will improve (MUST DOCUMENT ALL) Outcome: Progressing Goal: Knowledge of patient specific risk factors will improve Loraine Leriche N/A or DELETE if not current risk factor) Outcome: Progressing

## 2023-04-25 NOTE — Plan of Care (Signed)
  Problem: Fluid Volume: Goal: Ability to maintain a balanced intake and output will improve Outcome: Progressing   Problem: Metabolic: Goal: Ability to maintain appropriate glucose levels will improve Outcome: Progressing   Problem: Nutritional: Goal: Maintenance of adequate nutrition will improve Outcome: Progressing   Problem: Tissue Perfusion: Goal: Adequacy of tissue perfusion will improve Outcome: Progressing   Problem: Education: Goal: Knowledge of General Education information will improve Description: Including pain rating scale, medication(s)/side effects and non-pharmacologic comfort measures Outcome: Progressing   Problem: Clinical Measurements: Goal: Will remain free from infection Outcome: Progressing Goal: Diagnostic test results will improve Outcome: Progressing   Problem: Activity: Goal: Risk for activity intolerance will decrease Outcome: Progressing   Problem: Nutrition: Goal: Adequate nutrition will be maintained Outcome: Progressing

## 2023-04-25 NOTE — Patient Outreach (Signed)
Care Coordination   Follow Up Visit Note   04/25/2023 Name: Walter [name] MRN: [id number] DOB: 1940/11/15  [name] is a 82 y.o. year old male who sees Hunter, Aldine Contes, MD for primary care. Spoke with wife by phone to discuss pt's current admission. She shares events that led to admission and pt's current condition/progress. She is awaiting on approval for pt to go to inpt rehab. If he is not abe to go there she is hopeful to get him placed in SNF for short term rehab. She is working with inpt CM team to find a suitable facility that will accept patient. Support given to wife as he voices some frustrations and delays with process given the holiday season. RN CM will follow for d/c plans and disposition.Wife made aware that RN CM does not follow pt while in rehab and/or SNF and she voiced understanding.      Goals Addressed             This Visit's Progress    COMPLETED: TOC Care Plan       Current Barriers:  Knowledge Deficits related to plan of care for management of DMII and dementia   RNCM Clinical Goal(s):  Patient will work with the Care Management team over the next 30 days to address Transition of Care Barriers: mgmt of chronic conditons take all medications exactly as prescribed and will call provider for medication related questions as evidenced by EMR demonstrate understanding of rationale for each prescribed medication as evidenced by EMR attend all scheduled medical appointments: PCP and specialist as evidenced by EMR  through collaboration with RN Care manager, provider, and care team.   Interventions: Evaluation of current treatment plan related to  self management and patient's adherence to plan as established by provider   Diabetes Interventions:  (Status:   Goal not on Track-Patient Readmitted ) Short Term Goal Assessed patient's understanding of A1c goal: <7% Assessed nutritional status-per wife pt continues to have a good appetite,-eating well and still abel to go  to the bathroom on his own -Assessed for any abnormal cbgs readings and none reported Lab Results  Component Value Date   HGBA1C 7.4 (H) 12/27/2022     Dementia:  (Status:   Goal-not on track-pt readmitted )  Short Term Goal Evaluation of current treatment plan related to misuse of: Alzheimer's dementia Reviewed medications including any OTC meds being taken, Emotional Support Provided to patient/caregiver, Sleep assessment completed, Encouraged patient/caregiver counseling/support: wife is dealing with the drastic change/decline in pt's condition -limited support system, Consideration of in-home help encouraged , Discussed importance of discussing diagnosis with provider, and Advised to contact provider for new or worsening symptoms -Assessed for caregiver burnout/fatigue and coping- wife voices she has not had a chance to call [facility] to see if [facility] eligible for any services/benefits, she voices that her daughters and sister are a very good support sytem for her and her "outlet"to talk and shares her feelings, she spoke with [place] SW yesterday abut possible in home support options, she has an appt with [facility] SW from PCP referral to speak with SW on 04/24/23  -Assessed HH services- pt getting therapy about 2x/wk-therapist just eft he home a few mins ago -Reinforced safety in the home due to pt's mental status -Continued education/support of dementia- pt experiencing sundowner's early due to time change and "getting dark around 5pm" which makes it harder for wife to get everything done that's needed throughout the day  Patient Goals/Self-Care Activities: Participate in  Transition of Care Program/Attend TOC scheduled calls Notify RN Care Manager of TOC call rescheduling needs Take all medications as prescribed Attend all scheduled provider appointments Call provider office for new concerns or questions  Maintain safety in the home-report no falls Wife will continue to use support system to help her  with coping and caring for pt  Follow Up Plan:   RN CM will monitor to follow for d/c plans and disposition.          Encounter Outcome:  Patient Visit Completed    Antionette Fairy, RN,BSN,CCM RN Care Manager Transitions of Care  [place]  Direct Phone: [phone] Toll Free: [phone] Fax: [phone]

## 2023-04-25 NOTE — Patient Instructions (Signed)
Visit Information  Thank you for taking time to visit with me today. Please don't hesitate to contact me if I can be of assistance to you.   Following are the goals we discussed today:   Goals Addressed             This Visit's Progress    Obtain Supportive Resources-Caregiver Stress   On track    Activities and task to complete in order to accomplish goals.   Keep all upcoming appointments discussed today Continue with compliance of taking medication prescribed by Doctor Implement healthy coping skills discussed to assist with management of symptoms         Our next appointment is by telephone on 12/31 at 12:30 PM  Please call the care guide team at [phone] if you need to cancel or reschedule your appointment.   If you are experiencing a Mental Health or Behavioral Health Crisis or need someone to talk to, please call the Suicide and Crisis Lifeline: 988 call 911   Patient verbalizes understanding of instructions and care plan provided today and agrees to view in MyChart. Active MyChart status and patient understanding of how to access instructions and care plan via MyChart confirmed with patient.     [name], MSW, LCSW [facility] Care Management [place]  [facility] [email or web address] Phone [phone] 1:40 PM

## 2023-04-25 NOTE — Progress Notes (Signed)
Progress Note    Walter [name]   IRC:[id number]  DOB: [birth year]  DOA: 04/19/2023     4 PCP: Shelva Majestic, MD  Initial CC: AMS  Hospital Course: Walter [name] is an 81 yo male with PMH chronic dementia, HTN, HLD, DM II, OSA on CPAP, BPH, PTSD/anxiety/depression who presented with altered mentation.  Note, patient recently admitted from 11/29 until 12/1.  Admitted with altered mental status in the setting of sepsis secondary to aspiration pneumonia at the bilateral bases.  AMS also believed to have a delirium component in the setting of chronic dementia.  Was treated with antibiotics with improvement.  On discharge patient was alert and oriented to self only.  Plan for outpatient neurology follow-up.   Patient did follow-up with neurology who recommended continue donepezil and continued Lexapro/Seroquel for PTSD/anxiety/depression.  Further neuropsychiatric testing to be repeated in 2025.   Patient has not returned to previous baseline prior to that admission and is unclear if he will.  However for the past 2 to 3 days prior to admission, he has had significant worsening initially with increasing weakness and now significantly increased confusion with some associated agitation. His confusion appeared similar to how it was just prior to his recent admission.  He underwent workup with imaging studies in the ER.  CT was notable for acute PE involving RUL and RLL; there was also groundglass opacities in the right lower lobe concerning for infection. He was started on heparin drip, oxygen, and antibiotics and admitted for further monitoring.  Interval History:  No events overnight. Pleasantly confused this morning.  Awaiting placement (CIR vs SNF).  Updated wife and daughter on phone this afternoon.   Assessment and Plan: * Acute pulmonary embolism (HCC) - CT of the chest abdomen pelvis was obtained showing segmental/subsegmental pulmonary emboli of the right upper lobe and right lower lobe. -  Has reportedly had decreased activity since last admission.  Wife reports he is very sedentary at home and has had minimal physical activity and ambulation - Does have history of GI bleeding in 2023; no drop in hemoglobin nor evidence of bleeding while on heparin drip - Okay to transition to Eliquis -Will ask pharmacy to see if price check can be obtained - echo negative for heart strain   Acute metabolic encephalopathy-resolved as of 04/23/2023 - Believed to be secondary to infection and/or pulmonary embolism in the setting of chronic presumed diagnosis of cognitive impairment/dementia - similar to previous admission earlier in the month for sepsis.  CAP (community acquired pneumonia) - GGO noted on CT - RVP negative; negative covid,flu,rsv on admission  -Continue Rocephin and azithromycin for now.  PCT noted negative x 2; mild leuks, 1% bands  Cognitive impairment - has suspected diagnosis of dementia and if following outpatient with neurology for ongoing workup - History of some delirium and agitation during last admission as well; so far remains cooperative  - Continue home donepezil  History of GI bleed - Was admitted in 2023 for GI bleeding.  Underwent EGD and oozing duodenal ulcer was noted and patient underwent cautery and clipping.  Has follow-up with GI outpatient and had been on PPI but has since stopped taking this. > Given this was over a year ago we will pursue anticoagulation for pulmonary embolism as above but will start with heparin and restart PPI. - Resume PPI therapy - Trend CBC  Pulmonary nodule - 5 mm right lower lobe pulmonary nodule is new in the interval. No follow-up needed if patient  is low-risk. - 12 mm left lower lobe pulmonary nodule, similar to prior. This was evaluated by PET-CT 10/28/2022 and found to be non hypermetabolic.  Anxiety and depression -  Mentioned in recent neurology note but not listed on problem list.  Recommended to be addressed  appropriately along with treating dementia as cognitive decline can be multifactorial. - Previously had been on Lexapro and Seroquel. 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 This lesion was also not hypermetabolic on 10/28/2022 PET imaging and was characterized as stable compared back to 01/26/2022. PET-CT recommendations indicated 2 year follow-up from that exam.  Essential hypertension - Continue home regimen  Obstructive sleep apnea - Continue nightly CPAP  Hyperlipidemia associated with type 2 diabetes mellitus - Continue statin  Diabetes mellitus type II, controlled - SSI and CBG monitoring   Old records reviewed in assessment of this patient  Antimicrobials: Azithromycin 04/19/2023 >> 04/23/2023 Rocephin 04/19/2023 >> 04/23/2023  DVT prophylaxis:   apixaban (ELIQUIS) tablet 10 mg  apixaban (ELIQUIS) tablet 5 mg   Code Status:   Code Status: Limited: Do not attempt resuscitation (DNR) -DNR-LIMITED -Do Not Intubate/DNI   Mobility Assessment (Last 72 Hours)     Mobility Assessment     Row Name 04/25/23 0742 04/25/23 0739 04/24/23 2356 04/24/23 1117 04/24/23 1049   Does patient have an order for bedrest or is patient medically unstable No - Continue assessment No - Continue assessment No - Continue assessment -- --   What is the highest level of mobility based on the progressive mobility assessment? Level 4 (Walks with assist in room) - Balance while marching in place and cannot step forward and back - Complete Level 4 (Walks with assist in room) - Balance while marching in place and cannot step forward and back - Complete Level 4 (Walks with assist in room) - Balance while marching in place and cannot step forward and back - Complete Level 4 (Walks with assist in room) - Balance while marching in place and cannot step forward and back - Complete Level 4 (Walks  with assist in room) - Balance while marching in place and cannot step forward and back - Complete   Is the above level different from baseline mobility prior to current illness? Yes - Recommend PT order Yes - Recommend PT order Yes - Recommend PT order -- --    Row Name 04/24/23 0830 04/23/23 2341 04/23/23 2339 04/23/23 0800 04/23/23 0400   Does patient have an order for bedrest or is patient medically unstable No - Continue assessment No - Continue assessment No - Continue assessment No - Continue assessment No - Continue assessment   What is the highest level of mobility based on the progressive mobility assessment? Level 4 (Walks with assist in room) - Balance while marching in place and cannot step forward and back - Complete Level 4 (Walks with assist in room) - Balance while marching in place and cannot step forward and back - Complete Level 4 (Walks with assist in room) - Balance while marching in place and cannot step forward and back - Complete Level 4 (Walks with assist in room) - Balance while marching in place and cannot step forward and back - Complete Level 4 (Walks with assist in room) - Balance while marching in place and cannot step forward and back - Complete   Is the above  level different from baseline mobility prior to current illness? Yes - Recommend PT order Yes - Recommend PT order Yes - Recommend PT order Yes - Recommend PT order Yes - Recommend PT order    Row Name 04/23/23 0000 04/22/23 2100         Does patient have an order for bedrest or is patient medically unstable No - Continue assessment No - Continue assessment      What is the highest level of mobility based on the progressive mobility assessment? Level 4 (Walks with assist in room) - Balance while marching in place and cannot step forward and back - Complete Level 4 (Walks with assist in room) - Balance while marching in place and cannot step forward and back - Complete      Is the above level different from  baseline mobility prior to current illness? Yes - Recommend PT order Yes - Recommend PT order               Barriers to discharge: none Disposition Plan:  SNF vs CIR Status is: Inpt  Objective: Blood pressure 117/61, pulse (!) 54, temperature 98 F (36.7 C), temperature source Oral, resp. rate 20, height 6' (1.829 m), weight 76.8 kg, SpO2 100%.  Examination:  Physical Exam Constitutional:      General: He is not in acute distress. HENT:     Head: Normocephalic and atraumatic.     Mouth/Throat:     Mouth: Mucous membranes are moist.  Eyes:     Extraocular Movements: Extraocular movements intact.  Cardiovascular:     Rate and Rhythm: Normal rate and regular rhythm.  Pulmonary:     Effort: Pulmonary effort is normal. No respiratory distress.     Breath sounds: Normal breath sounds. No wheezing.  Abdominal:     General: Bowel sounds are normal. There is no distension.     Palpations: Abdomen is soft.     Tenderness: There is no abdominal tenderness.  Musculoskeletal:        General: Normal range of motion.     Cervical back: Normal range of motion and neck supple.  Skin:    General: Skin is warm and dry.  Neurological:     Mental Status: He is disoriented.     Cranial Nerves: Facial asymmetry (chronic left facial droop (confirmed by wife)) present.      Consultants:    Procedures:    Data Reviewed: Results for orders placed or performed during the hospital encounter of 04/19/23 (from the past 24 hours)  Glucose, capillary     Status: Abnormal   Collection Time: 04/24/23  4:19 PM  Result Value Ref Range   Glucose-Capillary 314 (H) 70 - 99 mg/dL  Glucose, capillary     Status: Abnormal   Collection Time: 04/24/23  9:30 PM  Result Value Ref Range   Glucose-Capillary 318 (H) 70 - 99 mg/dL  Glucose, capillary     Status: Abnormal   Collection Time: 04/25/23  7:47 AM  Result Value Ref Range   Glucose-Capillary 302 (H) 70 - 99 mg/dL  Glucose, capillary      Status: Abnormal   Collection Time: 04/25/23 11:30 AM  Result Value Ref Range   Glucose-Capillary 293 (H) 70 - 99 mg/dL    I have reviewed pertinent nursing notes, vitals, labs, and images as necessary. I have ordered labwork to follow up on as indicated.  I have reviewed the last notes from staff over past 24 hours. I have discussed  patient's care plan and test results with nursing staff, CM/SW, and other staff as appropriate.    LOS: 4 days   Lewie Chamber, MD [facility] 04/25/2023, 1:50 PM

## 2023-04-25 NOTE — Patient Outreach (Signed)
  Care Coordination   Initial Visit Note   04/24/2023 Name: Walter [name] MRN: [id number] DOB: 06-Aug-1940  Walter [name] is a 82 y.o. year old male who sees Hunter, Aldine Contes, MD for primary care. I spoke with  Caryn Bee spouse, Mervyn Gay, and adult daughter, Vikki Ports by phone today.  What matters to the patients health and wellness today?  Level of Care, Caregiver Stress    Goals Addressed             This Visit's Progress    Obtain Supportive Resources-Caregiver Stress   On track    Activities and task to complete in order to accomplish goals.   Keep all upcoming appointments discussed today Continue with compliance of taking medication prescribed by Doctor Implement healthy coping skills discussed to assist with management of symptoms         SDOH assessments and interventions completed:  No     Care Coordination Interventions:  Yes, provided  Interventions Today    Flowsheet Row Most Recent Value  Chronic Disease   Chronic disease during today's visit Diabetes, Other  [Pulmonary Embolism, Anxiety and Depression]  General Interventions   General Interventions Discussed/Reviewed General Interventions Discussed, Level of Care, [facility], Doctor Visits  Doctor Visits Discussed/Reviewed Doctor Visits Discussed  Level of Presenter, broadcasting, Skilled Nursing Facility, Development worker, international aid, Adult Daycare  [PACE]  Applications Medicaid, [place]-2  Education Interventions   Applications Medicaid, [place]2  Mental Health Interventions   Mental Health Discussed/Reviewed Mental Health Discussed, Coping Strategies  [Caregiver Stress]  Nutrition Interventions   Nutrition Discussed/Reviewed Nutrition Discussed  Pharmacy Interventions   Pharmacy Dicussed/Reviewed Pharmacy Topics Discussed, Medication Adherence  Safety Interventions   Safety Discussed/Reviewed Safety Discussed       Follow up plan: Follow up call scheduled for 2-4 weeks    Encounter Outcome:  Patient Visit Completed    [name], MSW, LCSW [facility] Care Management [facility]  [facility] [email or web address] Phone [phone] 1:39 PM

## 2023-04-25 NOTE — Care Management Important Message (Signed)
Important Message  Patient Details  Name: Walter [name] MRN: [id number] Date of Birth: [birth year]   Important Message Given:  Yes - Medicare IM     Renie Ora 04/25/2023, 10:34 AM

## 2023-04-26 ENCOUNTER — Inpatient Hospital Stay (HOSPITAL_COMMUNITY): Payer: Medicare Other

## 2023-04-26 DIAGNOSIS — G9341 Metabolic encephalopathy: Secondary | ICD-10-CM | POA: Diagnosis not present

## 2023-04-26 DIAGNOSIS — I2699 Other pulmonary embolism without acute cor pulmonale: Secondary | ICD-10-CM | POA: Diagnosis not present

## 2023-04-26 LAB — GLUCOSE, CAPILLARY
Glucose-Capillary: 278 mg/dL — ABNORMAL HIGH (ref 70–99)
Glucose-Capillary: 183 mg/dL — ABNORMAL HIGH (ref 70–99)
Glucose-Capillary: 247 mg/dL — ABNORMAL HIGH (ref 70–99)
Glucose-Capillary: 272 mg/dL — ABNORMAL HIGH (ref 70–99)

## 2023-04-26 MED ORDER — POLYETHYLENE GLYCOL 3350 17 G PO PACK
17.0000 g | PACK | Freq: Every day | ORAL | Status: DC
  Administered 2023-04-26 – 2023-05-03 (×7): 17 g via ORAL
  Filled 2023-04-26 (×8): qty 1

## 2023-04-26 NOTE — Plan of Care (Signed)
  Problem: Education: Goal: Ability to describe self-care measures that may prevent or decrease complications (Diabetes Survival Skills Education) will improve Outcome: Progressing Goal: Individualized Educational Video(s) Outcome: Progressing   Problem: Coping: Goal: Ability to adjust to condition or change in health will improve Outcome: Progressing   Problem: Fluid Volume: Goal: Ability to maintain a balanced intake and output will improve Outcome: Progressing   Problem: Health Behavior/Discharge Planning: Goal: Ability to identify and utilize available resources and services will improve Outcome: Progressing Goal: Ability to manage health-related needs will improve Outcome: Progressing   Problem: Metabolic: Goal: Ability to maintain appropriate glucose levels will improve Outcome: Progressing   Problem: Nutritional: Goal: Maintenance of adequate nutrition will improve Outcome: Progressing Goal: Progress toward achieving an optimal weight will improve Outcome: Progressing   Problem: Skin Integrity: Goal: Risk for impaired skin integrity will decrease Outcome: Progressing   Problem: Tissue Perfusion: Goal: Adequacy of tissue perfusion will improve Outcome: Progressing   Problem: Education: Goal: Knowledge of General Education information will improve Description: Including pain rating scale, medication(s)/side effects and non-pharmacologic comfort measures Outcome: Progressing   Problem: Health Behavior/Discharge Planning: Goal: Ability to manage health-related needs will improve Outcome: Progressing   Problem: Clinical Measurements: Goal: Ability to maintain clinical measurements within normal limits will improve Outcome: Progressing Goal: Will remain free from infection Outcome: Progressing Goal: Diagnostic test results will improve Outcome: Progressing Goal: Respiratory complications will improve Outcome: Progressing Goal: Cardiovascular complication will  be avoided Outcome: Progressing   Problem: Activity: Goal: Risk for activity intolerance will decrease Outcome: Progressing   Problem: Nutrition: Goal: Adequate nutrition will be maintained Outcome: Progressing   Problem: Coping: Goal: Level of anxiety will decrease Outcome: Progressing   Problem: Elimination: Goal: Will not experience complications related to bowel motility Outcome: Progressing Goal: Will not experience complications related to urinary retention Outcome: Progressing   Problem: Pain Management: Goal: General experience of comfort will improve Outcome: Progressing   Problem: Safety: Goal: Ability to remain free from injury will improve Outcome: Progressing   Problem: Skin Integrity: Goal: Risk for impaired skin integrity will decrease Outcome: Progressing   Problem: Activity: Goal: Ability to tolerate increased activity will improve Outcome: Progressing   Problem: Clinical Measurements: Goal: Ability to maintain a body temperature in the normal range will improve Outcome: Progressing   Problem: Respiratory: Goal: Ability to maintain adequate ventilation will improve Outcome: Progressing Goal: Ability to maintain a clear airway will improve Outcome: Progressing   Problem: Safety: Goal: Non-violent Restraint(s) Outcome: Progressing   Problem: Education: Goal: Knowledge of disease or condition will improve Outcome: Progressing Goal: Knowledge of secondary prevention will improve (MUST DOCUMENT ALL) Outcome: Progressing Goal: Knowledge of patient specific risk factors will improve Loraine Leriche N/A or DELETE if not current risk factor) Outcome: Progressing

## 2023-04-26 NOTE — Progress Notes (Addendum)
   04/26/23 0850  Mobility  Activity Ambulated with assistance in hallway  Level of Assistance Contact guard assist, steadying assist  Assistive Device Front wheel walker  Distance Ambulated (ft) 75 ft  Activity Response Tolerated well  Mobility Referral Yes  Mobility visit 1 Mobility  Mobility Specialist Start Time (ACUTE ONLY) 0815  Mobility Specialist Stop Time (ACUTE ONLY) 0850  Mobility Specialist Time Calculation (min) (ACUTE ONLY) 35 min   Mobility Specialist: Progress Note  Pre-Mobility: HR 76,  SpO2 92% RA During Mobility: HR 109, SpO2 92% RA Post-Mobility: SpO2 91%  Pt agreeable to mobility session - received in bed. Required CG using RW, given cues to stay close to RW, pt with R posture lean. Pt was asymptomatic throughout session with no complaints. Returned to chair with all needs met - call bell within reach. Chair alarm on.   Barnie Mort, BS Mobility Specialist Please contact via SecureChat or Rehab office at [phone].

## 2023-04-26 NOTE — Progress Notes (Signed)
Pt transferred to [facility] with RN and NT to have a BM. Call bell given to pt and instructed to call when he's done. NT was sitting at computer outside of pt's room when she heard a thud/crash. Pt found on hands and knees with forehead on the floor. Bleeding abrasion on forehead and skin tear on RFA. Pt reports he fell; he did not stand up. Criss Alvine, MD [facility] paged. Pt assisted back to BSC, BP 110/60. Pt assisted back to bed with Ghim[facility] bedside. Call bell within reach, bed alarm on. CT head ordered.

## 2023-04-26 NOTE — Progress Notes (Signed)
Progress Note    Walter [name]   WNU:[id number]  DOB: [birth year]  DOA: 04/19/2023     5 PCP: Shelva Majestic, MD  Initial CC: AMS  Hospital Course: Walter [name] is an 82 yo male with PMH chronic dementia, HTN, HLD, DM II, OSA on CPAP, BPH, PTSD/anxiety/depression who presented with altered mentation.  Note, patient recently admitted from 11/29 until 12/1.  Admitted with altered mental status in the setting of sepsis secondary to aspiration pneumonia at the bilateral bases.  AMS also believed to have a delirium component in the setting of chronic dementia.  Was treated with antibiotics with improvement.  On discharge patient was alert and oriented to self only.  Plan for outpatient neurology follow-up.   Patient did follow-up with neurology who recommended continue donepezil and continued Lexapro/Seroquel for PTSD/anxiety/depression.  Further neuropsychiatric testing to be repeated in 2025.   Patient has not returned to previous baseline prior to that admission and is unclear if he will.  However for the past 2 to 3 days prior to admission, he has had significant worsening initially with increasing weakness and now significantly increased confusion with some associated agitation.  He underwent workup with imaging studies in the ER.  CT was notable for acute PE involving RUL and RLL; there was also groundglass opacities in the right lower lobe concerning for infection. He was started on heparin drip, oxygen, and antibiotics and admitted for further monitoring.  Interval History:   Patient seen and examined.  On my early morning rounds he was sitting in chair.  He denies any complaints.  He is pleasant and mostly confused. Went back to examine patient, he was in bedside commode and fell forward onto the floor.  He has a small skin tear on his forehead and right forearm.  Skeletal survey negative.  CT head was negative.  Local dressing applied.  Fall precautions applied.  Assessment and Plan: *  Acute pulmonary embolism (HCC) - CT of the chest abdomen pelvis was obtained showing segmental/subsegmental pulmonary emboli of the right upper lobe and right lower lobe. - Has reportedly had decreased activity since last admission.  Wife reports he is very sedentary at home and has had minimal physical activity and ambulation - Does have history of GI bleeding in 2023; no drop in hemoglobin nor evidence of bleeding while on heparin drip - Now on Eliquis.  Acute metabolic encephalopathy-resolved as of 04/23/2023 - Believed to be secondary to infection and/or pulmonary embolism in the setting of chronic presumed diagnosis of cognitive impairment/dementia - similar to previous admission earlier in the month for sepsis.  CAP (community acquired pneumonia) - GGO noted on CT - RVP negative; negative covid,flu,rsv on admission  -Initially treated with antibiotics.  Completed therapy.   Cognitive impairment - has suspected diagnosis of dementia and if following outpatient with neurology for ongoing workup - History of some delirium and agitation during last admission as well; so far remains cooperative  - Continue home donepezil  History of GI bleed - Was admitted in 2023 for GI bleeding.  Underwent EGD and oozing duodenal ulcer was noted and patient underwent cautery and clipping.  Has follow-up with GI outpatient and had been on PPI but has since stopped taking this. - Resume PPI therapy - Trend CBC  Pulmonary nodule - 5 mm right lower lobe pulmonary nodule is new in the interval. No follow-up needed if patient is low-risk. - 12 mm left lower lobe pulmonary nodule, similar to prior. This was evaluated  by PET-CT 10/28/2022 and found to be non hypermetabolic.  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 This lesion was also not hypermetabolic on 10/28/2022 PET imaging and was characterized as stable compared back to 01/26/2022. PET-CT  recommendations indicated 2 year follow-up.  Essential hypertension - Continue home regimen  Obstructive sleep apnea - Continue nightly CPAP  Hyperlipidemia associated with type 2 diabetes mellitus - Continue statin  Diabetes mellitus type II, controlled - SSI and CBG monitoring   Antimicrobials: Azithromycin 04/19/2023 >> 04/23/2023 Rocephin 04/19/2023 >> 04/23/2023  DVT prophylaxis:   apixaban (ELIQUIS) tablet 10 mg  apixaban (ELIQUIS) tablet 5 mg   Code Status:   Code Status: Limited: Do not attempt resuscitation (DNR) -DNR-LIMITED -Do Not Intubate/DNI   Mobility Assessment (Last 72 Hours)     Mobility Assessment     Row Name 04/26/23 1030 04/26/23 0500 04/25/23 0742 04/25/23 0739 04/24/23 2356   Does patient have an order for bedrest or is patient medically unstable No - Continue assessment No - Continue assessment No - Continue assessment No - Continue assessment No - Continue assessment   What is the highest level of mobility based on the progressive mobility assessment? Level 4 (Walks with assist in room) - Balance while marching in place and cannot step forward and back - Complete Level 4 (Walks with assist in room) - Balance while marching in place and cannot step forward and back - Complete Level 4 (Walks with assist in room) - Balance while marching in place and cannot step forward and back - Complete Level 4 (Walks with assist in room) - Balance while marching in place and cannot step forward and back - Complete Level 4 (Walks with assist in room) - Balance while marching in place and cannot step forward and back - Complete   Is the above level different from baseline mobility prior to current illness? Yes - Recommend PT order Yes - Recommend PT order Yes - Recommend PT order Yes - Recommend PT order Yes - Recommend PT order    Row Name 04/24/23 1117 04/24/23 1049 04/24/23 0830 04/23/23 2341 04/23/23 2339   Does patient have an order for bedrest or is patient medically  unstable -- -- No - Continue assessment No - Continue assessment No - Continue assessment   What is the highest level of mobility based on the progressive mobility assessment? Level 4 (Walks with assist in room) - Balance while marching in place and cannot step forward and back - Complete Level 4 (Walks with assist in room) - Balance while marching in place and cannot step forward and back - Complete Level 4 (Walks with assist in room) - Balance while marching in place and cannot step forward and back - Complete Level 4 (Walks with assist in room) - Balance while marching in place and cannot step forward and back - Complete Level 4 (Walks with assist in room) - Balance while marching in place and cannot step forward and back - Complete   Is the above level different from baseline mobility prior to current illness? -- -- Yes - Recommend PT order Yes - Recommend PT order Yes - Recommend PT order            Barriers to discharge: none Disposition Plan:  SNF vs CIR Status is: Inpt  Objective: Blood pressure 110/66, pulse 91, temperature 98.4 F (36.9 C), temperature source Oral, resp. rate 18, height 6' (1.829 m), weight  78.4 kg, SpO2 95%.  Examination:  General: Looks fairly comfortable.  On room air. Cardiovascular: S1-S2 normal.  Regular rate rhythm. Respiratory: Bilateral clear.  No added sounds. Gastrointestinal: Soft.  Nontender.  Bowel sound present. Ext: No swelling or edema.  No cyanosis. Neuro: Alert and awake.  Oriented to himself.  Moves all extremities.  Weakness present.   Consultants:    Procedures:    Data Reviewed: Results for orders placed or performed during the hospital encounter of 04/19/23 (from the past 24 hours)  Glucose, capillary     Status: Abnormal   Collection Time: 04/25/23  4:45 PM  Result Value Ref Range   Glucose-Capillary 224 (H) 70 - 99 mg/dL  Glucose, capillary     Status: Abnormal   Collection Time: 04/25/23  9:29 PM  Result Value Ref Range    Glucose-Capillary 251 (H) 70 - 99 mg/dL  Glucose, capillary     Status: Abnormal   Collection Time: 04/26/23  8:03 AM  Result Value Ref Range   Glucose-Capillary 278 (H) 70 - 99 mg/dL  Glucose, capillary     Status: Abnormal   Collection Time: 04/26/23 12:05 PM  Result Value Ref Range   Glucose-Capillary 183 (H) 70 - 99 mg/dL      LOS: 5 days   Dorcas Carrow, MD [facility] 04/26/2023, 12:50 PM

## 2023-04-26 NOTE — Progress Notes (Signed)
Per chart review, CIR still following and are awaiting insurance determination for admit.   Johnnette Gourd, MSW, LCSWA Transitions of Care [phone]

## 2023-04-27 DIAGNOSIS — I2699 Other pulmonary embolism without acute cor pulmonale: Secondary | ICD-10-CM | POA: Diagnosis not present

## 2023-04-27 DIAGNOSIS — G9341 Metabolic encephalopathy: Secondary | ICD-10-CM | POA: Diagnosis not present

## 2023-04-27 LAB — GLUCOSE, CAPILLARY
Glucose-Capillary: 204 mg/dL — ABNORMAL HIGH (ref 70–99)
Glucose-Capillary: 179 mg/dL — ABNORMAL HIGH (ref 70–99)
Glucose-Capillary: 157 mg/dL — ABNORMAL HIGH (ref 70–99)
Glucose-Capillary: 146 mg/dL — ABNORMAL HIGH (ref 70–99)
Glucose-Capillary: 132 mg/dL — ABNORMAL HIGH (ref 70–99)

## 2023-04-27 NOTE — TOC Progression Note (Addendum)
Transition of Care [facility]) - Progression Note    Patient Details  Name: Walter [name] MRN: [id number] Date of Birth: 06/09/1940  [facility] CM/SW Contact  Erin Sons, [place] Phone Number: 04/27/2023, 1:28 PM  Clinical Narrative:      CSW cotacte Malvin Johns and [name] to inquire about potential bed offer; awaiting response/decision.   1400: [facility] declined bed offer.    Barriers to Discharge: Continued Medical Work up  Expected Discharge Plan and Services In-house Referral: Clinical Social Work     Living arrangements for the past 2 months: Single Family Home                           HH Arranged:  (Active with [place]  for PT OT SLP)           Social Determinants of Health (SDOH) Interventions SDOH Screenings   Food Insecurity: No Food Insecurity (04/20/2023)  Housing: Low Risk  (04/20/2023)  Recent Concern: Housing - High Risk (03/31/2023)  Transportation Needs: No Transportation Needs (04/20/2023)  Utilities: Not At Risk (04/20/2023)  Depression (PHQ2-9): High Risk (04/11/2023)  Financial Resource Strain: Low Risk  (05/12/2022)  Physical Activity: Sufficiently Active (05/12/2022)  Social Connections: Moderately Integrated (05/12/2022)  Stress: No Stress Concern Present (05/12/2022)  Tobacco Use: Medium Risk (04/19/2023)    Readmission Risk Interventions     No data to display

## 2023-04-27 NOTE — Progress Notes (Signed)
Progress Note    Walter [name]   WUJ:[id number]  DOB: 04-15-1941  DOA: 04/19/2023     6 PCP: Shelva Majestic, MD  Initial CC: AMS  Hospital Course: [name] is an 82 yo male with PMH chronic dementia, HTN, HLD, DM II, OSA on CPAP, BPH, PTSD/anxiety/depression who presented with altered mentation.  Note, patient recently admitted from 11/29 until 12/1.  Admitted with altered mental status in the setting of sepsis secondary to aspiration pneumonia at the bilateral bases.  AMS also believed to have a delirium component in the setting of chronic dementia.  Was treated with antibiotics with improvement.  On discharge patient was alert and oriented to self only.  Plan for outpatient neurology follow-up.   Patient did follow-up with neurology who recommended continue donepezil and continued Lexapro/Seroquel for PTSD/anxiety/depression.  Further neuropsychiatric testing to be repeated in 2025.   Patient has not returned to previous baseline prior to that admission and is unclear if he will.  However for the past 2 to 3 days prior to admission, he has had significant worsening initially with increasing weakness and now significantly increased confusion with some associated agitation.  He underwent workup with imaging studies in the ER.  CT was notable for acute PE involving RUL and RLL; there was also groundglass opacities in the right lower lobe concerning for infection. He was started on heparin drip, oxygen, and antibiotics and admitted for further monitoring.  Interval History:   Patient seen and examined.  Pleasant and confused.  Denies any complaints.  Sitting in chair.  No family at the bedside.  Assessment and Plan: * Acute pulmonary embolism (HCC) - CT of the chest abdomen pelvis was obtained showing segmental/subsegmental pulmonary emboli of the right upper lobe and right lower lobe. - Has reportedly had decreased activity since last admission.  Wife reports he is very sedentary at  home and has had minimal physical activity and ambulation - Does have history of GI bleeding in 2023; no drop in hemoglobin nor evidence of bleeding while on heparin drip - Now on Eliquis.  Acute metabolic encephalopathy-resolved as of 04/23/2023 - Believed to be secondary to infection and/or pulmonary embolism in the setting of chronic presumed diagnosis of cognitive impairment/dementia - similar to previous admission earlier in the month for sepsis.  CAP (community acquired pneumonia) - GGO noted on CT - RVP negative; negative covid,flu,rsv on admission  -Initially treated with antibiotics.  Completed therapy.   Cognitive impairment - has suspected diagnosis of dementia and if following outpatient with neurology for ongoing workup - History of some delirium and agitation during last admission as well; so far remains cooperative  - Continue home donepezil  History of GI bleed - Was admitted in 2023 for GI bleeding.  Underwent EGD and oozing duodenal ulcer was noted and patient underwent cautery and clipping.  Has follow-up with GI outpatient and had been on PPI but has since stopped taking this. - Resume PPI therapy - Trend CBC  Pulmonary nodule - 5 mm right lower lobe pulmonary nodule is new in the interval. No follow-up needed if patient is low-risk. - 12 mm left lower lobe pulmonary nodule, similar to prior. This was evaluated by PET-CT 10/28/2022 and found to be non hypermetabolic.  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 This lesion was also not hypermetabolic on 10/28/2022 PET imaging and was characterized as stable compared back to 01/26/2022. PET-CT recommendations indicated 2  year follow-up.  Essential hypertension - Continue home regimen  Obstructive sleep apnea - Continue nightly CPAP  Hyperlipidemia associated with type 2 diabetes mellitus - Continue statin  Diabetes mellitus type II, controlled - SSI and CBG  monitoring   Antimicrobials: Azithromycin 04/19/2023 >> 04/23/2023 Rocephin 04/19/2023 >> 04/23/2023  DVT prophylaxis:   apixaban (ELIQUIS) tablet 10 mg  apixaban (ELIQUIS) tablet 5 mg   Code Status:   Code Status: Limited: Do not attempt resuscitation (DNR) -DNR-LIMITED -Do Not Intubate/DNI   Mobility Assessment (Last 72 Hours)     Mobility Assessment     Row Name 04/26/23 2000 04/26/23 1030 04/26/23 0831 04/26/23 0500 04/25/23 0742   Does patient have an order for bedrest or is patient medically unstable No - Continue assessment No - Continue assessment No - Continue assessment No - Continue assessment No - Continue assessment   What is the highest level of mobility based on the progressive mobility assessment? Level 4 (Walks with assist in room) - Balance while marching in place and cannot step forward and back - Complete Level 4 (Walks with assist in room) - Balance while marching in place and cannot step forward and back - Complete Level 4 (Walks with assist in room) - Balance while marching in place and cannot step forward and back - Complete Level 4 (Walks with assist in room) - Balance while marching in place and cannot step forward and back - Complete Level 4 (Walks with assist in room) - Balance while marching in place and cannot step forward and back - Complete   Is the above level different from baseline mobility prior to current illness? Yes - Recommend PT order Yes - Recommend PT order Yes - Recommend PT order Yes - Recommend PT order Yes - Recommend PT order    Row Name 04/25/23 0739 04/24/23 2356         Does patient have an order for bedrest or is patient medically unstable No - Continue assessment No - Continue assessment      What is the highest level of mobility based on the progressive mobility assessment? Level 4 (Walks with assist in room) - Balance while marching in place and cannot step forward and back - Complete Level 4 (Walks with assist in room) - Balance  while marching in place and cannot step forward and back - Complete      Is the above level different from baseline mobility prior to current illness? Yes - Recommend PT order Yes - Recommend PT order               Barriers to discharge: none Disposition Plan:  SNF vs CIR Status is: Inpt  Objective: Blood pressure 119/72, pulse 74, temperature 98.3 F (36.8 C), temperature source Oral, resp. rate 18, height 6' (1.829 m), weight 77.9 kg, SpO2 91%.  Examination:  General: Comfortably sitting in chair. Cardiovascular: S1-S2 normal.  Regular rate rhythm. Respiratory: Bilateral clear.  No added sounds. Gastrointestinal: Soft.  Nontender.  Bowel sound present. Ext: No swelling or edema.  No cyanosis. Neuro: Alert and awake.  Oriented to himself.  Moves all extremities.  Weakness present. Patient has a skin tear in his forehead and right arm.   Consultants:    Procedures:    Data Reviewed: Results for orders placed or performed during the hospital encounter of 04/19/23 (from the past 24 hours)  Glucose, capillary     Status: Abnormal   Collection Time: 04/26/23 12:05 PM  Result Value Ref Range  Glucose-Capillary 183 (H) 70 - 99 mg/dL  Glucose, capillary     Status: Abnormal   Collection Time: 04/26/23  4:11 PM  Result Value Ref Range   Glucose-Capillary 247 (H) 70 - 99 mg/dL  Glucose, capillary     Status: Abnormal   Collection Time: 04/26/23  9:07 PM  Result Value Ref Range   Glucose-Capillary 272 (H) 70 - 99 mg/dL  Glucose, capillary     Status: Abnormal   Collection Time: 04/27/23  7:39 AM  Result Value Ref Range   Glucose-Capillary 204 (H) 70 - 99 mg/dL      LOS: 6 days   Dorcas Carrow, MD [facility] 04/27/2023, 11:42 AM

## 2023-04-27 NOTE — Plan of Care (Signed)
  Problem: Education: Goal: Ability to describe self-care measures that may prevent or decrease complications (Diabetes Survival Skills Education) will improve Outcome: Progressing   Problem: Coping: Goal: Ability to adjust to condition or change in health will improve Outcome: Progressing   Problem: Fluid Volume: Goal: Ability to maintain a balanced intake and output will improve Outcome: Progressing   

## 2023-04-27 NOTE — Progress Notes (Signed)
Physical Therapy Treatment Patient Details Name: Walter [name] MRN: [id number] DOB: [birth year] Today's Date: 04/27/2023   History of Present Illness Patient is an 82 y.o. male presented to ED with AMS. Pt recently admitted 11/29-12/1 for AMS due to sepsis secondary to PNA. CT of the chest abdomen and pelvis with contrast demonstrated a segmental/subsegmental pulmonary emboli at right upper lobe and right lower lobe. PMH significant of hypertension, hyperlipidemia, diabetes, OSA on CPAP, BPH, aspiration, PTSD, anxiety, depression, dementia, pancreatic cyst.    PT Comments  Patient resting in bed and agreeable to mobilize with therapy. Min assist for bed mobility and sit<>stand with RW. Pt with slight posterior lean and Rt lean in standing. Pt able to self correct posterior lean with time and assist needed to obtain more midline posture. Gait completed at hallway distance. Pt with strong trunk flexion and heavy pressure on RW advacning forward quickly in unsafe and unsteady quality; mod assist to prevent LOB. Chair follow provided and seated rest provided as pt became frustrated at slower pace and cues to avoid obstacles. EOS pt returned to bed with Min assist for step pivot transfer and min assist for sit>supine. Will continue to progress as able.   If plan is discharge home, recommend the following: A lot of help with walking and/or transfers;A lot of help with bathing/dressing/bathroom;Assist for transportation;Help with stairs or ramp for entrance;Assistance with cooking/housework   Can travel by private vehicle        Equipment Recommendations  Rolling walker (2 wheels)    Recommendations for Other Services Rehab consult     Precautions / Restrictions Precautions Precautions: Fall Precaution Comments: R toe amputations affecting balance; does better with shoes Restrictions Weight Bearing Restrictions Per Provider Order: No     Mobility  Bed Mobility Overal bed mobility: Needs  Assistance Bed Mobility: Supine to Sit, Sit to Supine           General bed mobility comments: light min assist for safety with supine<>sit    Transfers Overall transfer level: Needs assistance Equipment used: Rolling walker (2 wheels) Transfers: Sit to/from Stand Sit to Stand: Min assist           General transfer comment: min assist to power up and correct lean, pt with slight posterior and Rt lean. 2x sit<>stand completed.    Ambulation/Gait Ambulation/Gait assistance: Min assist, Mod assist Gait Distance (Feet): 50 Feet Assistive device: Rolling walker (2 wheels) Gait Pattern/deviations: Step-through pattern, Decreased stride length, Drifts right/left, Decreased step length - left, Trunk flexed, Knee flexed in stance - right, Knee flexed in stance - left Gait velocity: decr     General Gait Details: pt with silght Rt lean and min assist to weight shift and correct. pt with strong trunk flexion and heavy pressure on RW advacning forward quickly and unsteady. Mod assist to prevent LOB and pt frustrated by slowed pace with cuse needed for safety. Close chair follow.   Stairs             Wheelchair Mobility     Tilt Bed    Modified Rankin (Stroke Patients Only)       Balance Overall balance assessment: Needs assistance Sitting-balance support: Feet supported, Bilateral upper extremity supported Sitting balance-Leahy Scale: Poor Sitting balance - Comments: UE support Postural control: Posterior lean Standing balance support: Bilateral upper extremity supported, During functional activity, Single extremity supported, Reliant on assistive device for balance Standing balance-Leahy Scale: Poor  Cognition Arousal: Alert Behavior During Therapy: Impulsive Overall Cognitive Status: History of cognitive impairments - at baseline                                 General Comments: history of dementia;  disoriented to time, place, and situation; decr safety awareness; decr awareness of deficits; poor problem solving        Exercises      General Comments        Pertinent Vitals/Pain Pain Assessment Pain Assessment: No/denies pain Faces Pain Scale: No hurt Pain Intervention(s): Monitored during session, Limited activity within patient's tolerance    Home Living                          Prior Function            PT Goals (current goals can now be found in the care plan section) Acute Rehab PT Goals Patient Stated Goal: to walk PT Goal Formulation: With patient/family Time For Goal Achievement: 05/04/23 Potential to Achieve Goals: Good Progress towards PT goals: Progressing toward goals    Frequency    Min 1X/week      PT Plan      Co-evaluation              AM-PAC PT "6 Clicks" Mobility   Outcome Measure  Help needed turning from your back to your side while in a flat bed without using bedrails?: A Little Help needed moving from lying on your back to sitting on the side of a flat bed without using bedrails?: A Little Help needed moving to and from a bed to a chair (including a wheelchair)?: A Lot Help needed standing up from a chair using your arms (e.g., wheelchair or bedside chair)?: A Little Help needed to walk in hospital room?: A Lot Help needed climbing 3-5 steps with a railing? : Total 6 Click Score: 14    End of Session Equipment Utilized During Treatment: Gait belt Activity Tolerance: Patient tolerated treatment well Patient left: with call bell/phone within reach;in bed;with bed alarm set Nurse Communication: Mobility status PT Visit Diagnosis: Unsteadiness on feet (R26.81);Other abnormalities of gait and mobility (R26.89);Muscle weakness (generalized) (M62.81);Difficulty in walking, not elsewhere classified (R26.2)     Time: 8657-8469 PT Time Calculation (min) (ACUTE ONLY): 23 min  Charges:    $Gait Training: 23-37  mins PT General Charges $$ ACUTE PT VISIT: 1 Visit                     Wynn Maudlin, DPT Acute Rehabilitation Services Office [phone]  04/27/23 12:59 PM

## 2023-04-27 NOTE — Plan of Care (Signed)
  Problem: Education: Goal: Ability to describe self-care measures that may prevent or decrease complications (Diabetes Survival Skills Education) will improve Outcome: Progressing Goal: Individualized Educational Video(s) Outcome: Progressing   Problem: Coping: Goal: Ability to adjust to condition or change in health will improve Outcome: Progressing   Problem: Fluid Volume: Goal: Ability to maintain a balanced intake and output will improve Outcome: Progressing   Problem: Health Behavior/Discharge Planning: Goal: Ability to identify and utilize available resources and services will improve Outcome: Progressing Goal: Ability to manage health-related needs will improve Outcome: Progressing   Problem: Metabolic: Goal: Ability to maintain appropriate glucose levels will improve Outcome: Progressing   Problem: Nutritional: Goal: Maintenance of adequate nutrition will improve Outcome: Progressing Goal: Progress toward achieving an optimal weight will improve Outcome: Progressing   Problem: Skin Integrity: Goal: Risk for impaired skin integrity will decrease Outcome: Progressing   Problem: Tissue Perfusion: Goal: Adequacy of tissue perfusion will improve Outcome: Progressing   Problem: Education: Goal: Knowledge of General Education information will improve Description: Including pain rating scale, medication(s)/side effects and non-pharmacologic comfort measures Outcome: Progressing   Problem: Health Behavior/Discharge Planning: Goal: Ability to manage health-related needs will improve Outcome: Progressing   Problem: Clinical Measurements: Goal: Ability to maintain clinical measurements within normal limits will improve Outcome: Progressing Goal: Will remain free from infection Outcome: Progressing Goal: Diagnostic test results will improve Outcome: Progressing Goal: Respiratory complications will improve Outcome: Progressing Goal: Cardiovascular complication will  be avoided Outcome: Progressing   Problem: Activity: Goal: Risk for activity intolerance will decrease Outcome: Progressing   Problem: Nutrition: Goal: Adequate nutrition will be maintained Outcome: Progressing   Problem: Coping: Goal: Level of anxiety will decrease Outcome: Progressing   Problem: Elimination: Goal: Will not experience complications related to bowel motility Outcome: Progressing Goal: Will not experience complications related to urinary retention Outcome: Progressing   Problem: Pain Management: Goal: General experience of comfort will improve Outcome: Progressing   Problem: Safety: Goal: Ability to remain free from injury will improve Outcome: Progressing   Problem: Skin Integrity: Goal: Risk for impaired skin integrity will decrease Outcome: Progressing   Problem: Activity: Goal: Ability to tolerate increased activity will improve Outcome: Progressing   Problem: Clinical Measurements: Goal: Ability to maintain a body temperature in the normal range will improve Outcome: Progressing   Problem: Respiratory: Goal: Ability to maintain adequate ventilation will improve Outcome: Progressing Goal: Ability to maintain a clear airway will improve Outcome: Progressing   Problem: Safety: Goal: Non-violent Restraint(s) Outcome: Progressing   Problem: Education: Goal: Knowledge of disease or condition will improve Outcome: Progressing Goal: Knowledge of secondary prevention will improve (MUST DOCUMENT ALL) Outcome: Progressing Goal: Knowledge of patient specific risk factors will improve Loraine Leriche N/A or DELETE if not current risk factor) Outcome: Progressing

## 2023-04-28 DIAGNOSIS — R4182 Altered mental status, unspecified: Secondary | ICD-10-CM | POA: Diagnosis not present

## 2023-04-28 LAB — GLUCOSE, CAPILLARY
Glucose-Capillary: 152 mg/dL — ABNORMAL HIGH (ref 70–99)
Glucose-Capillary: 157 mg/dL — ABNORMAL HIGH (ref 70–99)
Glucose-Capillary: 157 mg/dL — ABNORMAL HIGH (ref 70–99)
Glucose-Capillary: 139 mg/dL — ABNORMAL HIGH (ref 70–99)

## 2023-04-28 NOTE — Progress Notes (Signed)
   04/28/23 2234  BiPAP/CPAP/SIPAP  Reason BIPAP/CPAP not in use Non-compliant   Pt refused CPAP for nighttime use.

## 2023-04-28 NOTE — Care Management Important Message (Signed)
Important Message  Patient Details  Name: Walter [name] MRN: [id number] Date of Birth: 04/06/1941   Important Message Given:  Yes - Medicare IM     Renie Ora 04/28/2023, 9:01 AM

## 2023-04-28 NOTE — Plan of Care (Signed)
  Problem: Coping: Goal: Ability to adjust to condition or change in health will improve Outcome: Progressing   Problem: Fluid Volume: Goal: Ability to maintain a balanced intake and output will improve Outcome: Progressing   Problem: Health Behavior/Discharge Planning: Goal: Ability to identify and utilize available resources and services will improve Outcome: Progressing   Problem: Metabolic: Goal: Ability to maintain appropriate glucose levels will improve Outcome: Progressing   Problem: Nutritional: Goal: Maintenance of adequate nutrition will improve Outcome: Progressing Goal: Progress toward achieving an optimal weight will improve Outcome: Progressing   Problem: Skin Integrity: Goal: Risk for impaired skin integrity will decrease Outcome: Progressing   Problem: Education: Goal: Knowledge of General Education information will improve Description: Including pain rating scale, medication(s)/side effects and non-pharmacologic comfort measures Outcome: Progressing   Problem: Clinical Measurements: Goal: Will remain free from infection Outcome: Progressing Goal: Diagnostic test results will improve Outcome: Progressing Goal: Respiratory complications will improve Outcome: Progressing Goal: Cardiovascular complication will be avoided Outcome: Progressing   Problem: Activity: Goal: Risk for activity intolerance will decrease Outcome: Progressing   Problem: Nutrition: Goal: Adequate nutrition will be maintained Outcome: Progressing   Problem: Coping: Goal: Level of anxiety will decrease Outcome: Progressing   Problem: Elimination: Goal: Will not experience complications related to bowel motility Outcome: Progressing Goal: Will not experience complications related to urinary retention Outcome: Progressing   Problem: Pain Management: Goal: General experience of comfort will improve Outcome: Progressing   Problem: Safety: Goal: Ability to remain free from  injury will improve Outcome: Progressing   Problem: Skin Integrity: Goal: Risk for impaired skin integrity will decrease Outcome: Progressing   Problem: Activity: Goal: Ability to tolerate increased activity will improve Outcome: Progressing

## 2023-04-28 NOTE — Progress Notes (Signed)
Mobility Specialist Progress Note:    04/28/23 1200  Mobility  Activity Ambulated with assistance in hallway  Level of Assistance Moderate assist, patient does 50-74%  Assistive Device Front wheel walker  Distance Ambulated (ft) 70 ft  Activity Response Tolerated well  Mobility Referral Yes  Mobility visit 1 Mobility  Mobility Specialist Start Time (ACUTE ONLY) 1142  Mobility Specialist Stop Time (ACUTE ONLY) 1158  Mobility Specialist Time Calculation (min) (ACUTE ONLY) 16 min   Pt received in bed, agreeable to mobility. Required modA to sit at EOB and during ambulation d/t severe R lateral lean. No complaints throughout. Pt left in bed with bed alarm on.  [place] Earlene Plater Mobility Specialist Please contact via Special educational needs teacher or Rehab office at [phone]

## 2023-04-28 NOTE — Plan of Care (Signed)
  Problem: Education: Goal: Ability to describe self-care measures that may prevent or decrease complications (Diabetes Survival Skills Education) will improve Outcome: Progressing Goal: Individualized Educational Video(s) Outcome: Progressing   Problem: Coping: Goal: Ability to adjust to condition or change in health will improve Outcome: Progressing   Problem: Fluid Volume: Goal: Ability to maintain a balanced intake and output will improve Outcome: Progressing   Problem: Health Behavior/Discharge Planning: Goal: Ability to identify and utilize available resources and services will improve Outcome: Progressing Goal: Ability to manage health-related needs will improve Outcome: Progressing   Problem: Metabolic: Goal: Ability to maintain appropriate glucose levels will improve Outcome: Progressing   Problem: Nutritional: Goal: Maintenance of adequate nutrition will improve Outcome: Progressing Goal: Progress toward achieving an optimal weight will improve Outcome: Progressing   Problem: Tissue Perfusion: Goal: Adequacy of tissue perfusion will improve Outcome: Progressing   Problem: Education: Goal: Knowledge of General Education information will improve Description: Including pain rating scale, medication(s)/side effects and non-pharmacologic comfort measures Outcome: Progressing   Problem: Clinical Measurements: Goal: Ability to maintain clinical measurements within normal limits will improve Outcome: Progressing Goal: Will remain free from infection Outcome: Progressing Goal: Diagnostic test results will improve Outcome: Progressing Goal: Respiratory complications will improve Outcome: Progressing

## 2023-04-28 NOTE — TOC Progression Note (Signed)
Transition of Care [facility]) - Progression Note    Patient Details  Name: Moxon Behney MRN: [id number] Date of Birth: 1940/10/04  [facility] CM/SW Contact  Erin Sons, [place] Phone Number: 04/28/2023, 3:14 PM  Clinical Narrative:     CSW spoke with pt's daughter over the phone and provided update on SNF bed offers. Family is interested in [facility], [place], [place]. Referrals already sent to [name] and [facility]; CSW contacted admissions for update and awaiting responses. CSW sent referral to [facility]. TOC will continue to follow.     Barriers to Discharge: Continued Medical Work up  Expected Discharge Plan and Services In-house Referral: Clinical Social Work     Living arrangements for the past 2 months: Single Family Home                           HH Arranged:  (Active with [facility]  for PT OT SLP)           Social Determinants of Health (SDOH) Interventions SDOH Screenings   Food Insecurity: No Food Insecurity (04/20/2023)  Housing: Low Risk  (04/20/2023)  Recent Concern: Housing - High Risk (03/31/2023)  Transportation Needs: No Transportation Needs (04/20/2023)  Utilities: Not At Risk (04/20/2023)  Depression (PHQ2-9): High Risk (04/11/2023)  Financial Resource Strain: Low Risk  (05/12/2022)  Physical Activity: Sufficiently Active (05/12/2022)  Social Connections: Moderately Integrated (05/12/2022)  Stress: No Stress Concern Present (05/12/2022)  Tobacco Use: Medium Risk (04/19/2023)    Readmission Risk Interventions     No data to display

## 2023-04-28 NOTE — Progress Notes (Signed)
IP rehab admissions - I called [facility] medicare insurance carrier.  The case for acute inpatient rehab is pending.  The case was initiated on 04/24/23 but we do not yet have a determination.  We have also verified in the computer portal that the case is pending.  Call me for questions.  [phone]

## 2023-04-28 NOTE — Progress Notes (Signed)
Progress Note   Patient: Walter [name] VHQ:[id number] DOB: February 12, 1941 DOA: 04/19/2023     7 DOS: the patient was seen and examined on 04/28/2023   Brief hospital course: Walter [name] is an 82 yo male with PMH chronic dementia, HTN, HLD, DM II, OSA on CPAP, BPH, PTSD/anxiety/depression who presented with altered mentation.  Note, patient recently admitted from 11/29 until 12/1.  Admitted with altered mental status in the setting of sepsis secondary to aspiration pneumonia at the bilateral bases.  AMS also believed to have a delirium component in the setting of chronic dementia.  Was treated with antibiotics with improvement.  On discharge patient was alert and oriented to self only.  Plan for outpatient neurology follow-up.   Patient did follow-up with neurology who recommended continue donepezil and continued Lexapro/Seroquel for PTSD/anxiety/depression.  Further neuropsychiatric testing to be repeated in 2025.   Patient has not returned to previous baseline prior to that admission and is unclear if he will.  However for the past 2 to 3 days prior to admission, he has had significant worsening initially with increasing weakness and now significantly increased confusion with some associated agitation. His confusion appeared similar to how it was just prior to his recent admission.  He underwent workup with imaging studies in the ER.  CT was notable for acute PE involving RUL and RLL; there was also groundglass opacities in the right lower lobe concerning for infection. He was started on heparin drip, oxygen, and antibiotics and admitted for further monitoring.  Assessment and Plan: * Acute pulmonary embolism (HCC) - Eliquis 5 mg PO bid   CAP (community acquired pneumonia) - Completed antibx   Cognitive impairment - Aricept 10 mg PO daily   History of GI bleed - Ferrous sulfate 325 mg PO every other day - Protonix 40 mg PO daily    Pulmonary nodule - 12 mm left lower lobe pulmonary nodule,  similar to prior. This was evaluated by PET-CT 10/28/2022 and found to be non hypermetabolic.   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 He awaits safe disposition (acute inpt rehab case is still pending) vs SNF.  Physical Exam: Vitals:   04/28/23 0000 04/28/23 0400 04/28/23 0500 04/28/23 1050  BP:   116/71 113/74  Pulse:   77 89  Resp:   18 14  Temp:   98.3 F (36.8 C) 98.5 F (36.9 C)  TempSrc:   Oral Oral  SpO2: 94% 95%  90%  Weight:   76 kg   Height:       Physical Exam HENT:     Head: Normocephalic.     Mouth/Throat:     Mouth: Mucous membranes are moist.  Cardiovascular:     Rate and Rhythm: Normal rate and regular rhythm.  Pulmonary:     Effort: Pulmonary effort is normal.  Abdominal:     Palpations: Abdomen is soft.  Musculoskeletal:     Cervical back: Neck supple.  Skin:    General: Skin is warm.  Neurological:     Mental Status: He is alert. Mental status is at baseline.  Psychiatric:        Mood and Affect: Mood normal.       Disposition: Status is: Inpatient Remains inpatient appropriate because: While awaiting safe disposition   Planned Discharge Destination: Rehab    Time spent: 35 minutes  Author: Baron Hamper , MD 04/28/2023 12:56 PM  For on call review [email or web address].

## 2023-04-28 NOTE — Progress Notes (Addendum)
IP rehab admissions - Dr. Shearon Stalls did they peer to peer review today and patient was not approved for CIR.  I will let wife know of denial.  Call for questions #[phone]  I did speak with patient's wife this pm.  They are willing to pursue SNF placement at this point.  We will not file an appeal at this time to pursue inpatient rehab any longer.  [phone]

## 2023-04-29 DIAGNOSIS — I2699 Other pulmonary embolism without acute cor pulmonale: Secondary | ICD-10-CM | POA: Diagnosis not present

## 2023-04-29 LAB — GLUCOSE, CAPILLARY
Glucose-Capillary: 242 mg/dL — ABNORMAL HIGH (ref 70–99)
Glucose-Capillary: 166 mg/dL — ABNORMAL HIGH (ref 70–99)
Glucose-Capillary: 119 mg/dL — ABNORMAL HIGH (ref 70–99)
Glucose-Capillary: 167 mg/dL — ABNORMAL HIGH (ref 70–99)

## 2023-04-29 NOTE — TOC Progression Note (Signed)
Transition of Care [facility]) - Progression Note    Patient Details  Name: Walter [name] MRN: [id number] Date of Birth: 10-07-1940  Transition of Care [facility]) CM/SW Contact  Patrice Paradise, LCSW Phone Number: 04/29/2023, 3:53 PM  Clinical Narrative:     Csw spoke with pt's daughter Vikki Ports in regards to bed offer. CSW reconciled the bed offers list and Vikki Ports requested that referral be sent to [facility], [facility] as well as [facility]. She asked for the weekday CSW contact information which was provided. Vikki Ports explained that they did not want to send pt to a 1 or 2 star facility.  TOC team will continue to assist with discharge planning needs.     Barriers to Discharge: Continued Medical Work up  Expected Discharge Plan and Services In-house Referral: Clinical Social Work     Living arrangements for the past 2 months: Single Family Home                           HH Arranged:  (Active with [facility]  for PT OT SLP)           Social Determinants of Health (SDOH) Interventions SDOH Screenings   Food Insecurity: No Food Insecurity (04/20/2023)  Housing: Low Risk  (04/20/2023)  Recent Concern: Housing - High Risk (03/31/2023)  Transportation Needs: No Transportation Needs (04/20/2023)  Utilities: Not At Risk (04/20/2023)  Depression (PHQ2-9): High Risk (04/11/2023)  Financial Resource Strain: Low Risk  (05/12/2022)  Physical Activity: Sufficiently Active (05/12/2022)  Social Connections: Moderately Integrated (05/12/2022)  Stress: No Stress Concern Present (05/12/2022)  Tobacco Use: Medium Risk (04/19/2023)    Readmission Risk Interventions     No data to display

## 2023-04-29 NOTE — Progress Notes (Signed)
[facility] PROGRESS NOTE  Walter [name] (DOB: Aug 30, 1940) UJW:[id number] PCP: Shelva Majestic, MD  Brief Narrative: Walter [name] is an 82 yo male with PMH chronic dementia, HTN, HLD, DM II, OSA on CPAP, BPH, PTSD/anxiety/depression who presented with altered mentation.   Note, patient recently admitted from 11/29 until 12/1.  Admitted with altered mental status in the setting of sepsis secondary to aspiration pneumonia at the bilateral bases.  AMS also believed to have a delirium component in the setting of chronic dementia.  Was treated with antibiotics with improvement.  On discharge patient was alert and oriented to self only.  Plan for outpatient neurology follow-up.   Patient did follow-up with neurology who recommended continue donepezil and continued Lexapro/Seroquel for PTSD/anxiety/depression.  Further neuropsychiatric testing to be repeated in 2025.   Patient has not returned to previous baseline prior to that admission and is unclear if he will.  However for the past 2 to 3 days prior to admission, he has had significant worsening initially with increasing weakness and now significantly increased confusion with some associated agitation. His confusion appeared similar to how it was just prior to his recent admission.   He underwent workup with imaging studies in the ER.  CT was notable for acute PE involving RUL and RLL; there was also groundglass opacities in the right lower lobe concerning for infection. He was started on heparin drip, oxygen, and antibiotics and admitted for further monitoring.  Subjective: No dyspnea, chest pain or other complaints. Eager to discharge, has trouble recalling events and plan.   Objective: BP 112/79 (BP Location: Left Arm)   Pulse 80   Temp 97.9 F (36.6 C) (Oral)   Resp 17   Ht 6' (1.829 m)   Wt 76.3 kg   SpO2 94%   BMI 22.81 kg/m   Gen: Elderly male, well-appearing in no distress Pulm: Clear, nonlabored  CV: RRR, no MRG or pitting  edema GI: Soft, NT, ND, +BS  Neuro: Alert and not completely oriented, MAE without focal deficits. Ext: Warm, no deformities. Skin: No rashes, lesions or ulcers on visualized skin   Assessment & Plan: Acute PE: Involving segmental RUL and RLL. Normal RV by echo, negative BL LE venous U/S.  - Continue eliquis 5mg  po BID (having completed load)  Deconditioning: Requires rehabilitation. CIR was pursued but denied after peer-to-peer. CSW has sent referrals to multiple SNFs.  - Patient is medically stable for discharge once SNF bed is available.    RLL CAP: Vs. infarct on initial CT. This was treated x5 days CTX, azithromycin.    Dementia:  - Delirium precautions, continue aricept   History of GI bleed: No current gross bleeding on new anticoagulation. - Ferrous sulfate 325 mg PO every other day - Protonix 40 mg PO daily     Pulmonary nodule: 12 mm left lower lobe pulmonary nodule, similar to prior. This was evaluated by PET-CT 10/28/2022 and found to be non hypermetabolic.    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    HLD:  - Continue rosuvastatin 20mg     T2DM: At goal for age with HbA1c 7.1%.  - Continue metformin with no further contrast planned, continue linagliptin and mod SSI   Tyrone Nine, MD [facility] [email or web address] 04/29/2023, 12:39 PM

## 2023-04-29 NOTE — Progress Notes (Signed)
Patient declines CPAP at night. No unit in room at this time.

## 2023-04-30 DIAGNOSIS — I2699 Other pulmonary embolism without acute cor pulmonale: Secondary | ICD-10-CM | POA: Diagnosis not present

## 2023-04-30 LAB — GLUCOSE, CAPILLARY
Glucose-Capillary: 157 mg/dL — ABNORMAL HIGH (ref 70–99)
Glucose-Capillary: 146 mg/dL — ABNORMAL HIGH (ref 70–99)
Glucose-Capillary: 203 mg/dL — ABNORMAL HIGH (ref 70–99)
Glucose-Capillary: 207 mg/dL — ABNORMAL HIGH (ref 70–99)

## 2023-04-30 NOTE — Progress Notes (Signed)
PROGRESS NOTE    Walter [name]  WUJ:[id number] DOB: 04-May-1940 DOA: 04/19/2023 PCP: Shelva Majestic, MD  Chief Complaint  Patient presents with   Altered Mental Status    Brief Narrative:   Walter [name] is an 82 yo male with PMH chronic dementia, HTN, HLD, DM II, OSA on CPAP, BPH, PTSD/anxiety/depression who presented with altered mentation.   Note, patient recently admitted from 11/29 until 12/1.  Admitted with altered mental status in the setting of sepsis secondary to aspiration pneumonia at the bilateral bases.  AMS also believed to have Trino Higinbotham delirium component in the setting of chronic dementia.  Was treated with antibiotics with improvement.  On discharge patient was alert and oriented to self only.  Plan for outpatient neurology follow-up.   Patient did follow-up with neurology who recommended continue donepezil and continued Lexapro/Seroquel for PTSD/anxiety/depression.  Further neuropsychiatric testing to be repeated in 2025.   Patient has not returned to previous baseline prior to that admission and is unclear if he will.  However for the past 2 to 3 days prior to admission, he has had significant worsening initially with increasing weakness and now significantly increased confusion with some associated agitation. His confusion appeared similar to how it was just prior to his recent admission.   He underwent workup with imaging studies in the ER.  CT was notable for acute PE involving RUL and RLL; there was also groundglass opacities in the right lower lobe concerning for infection. He was started on heparin drip, oxygen, and antibiotics and admitted for further monitoring.  Assessment & Plan:   Principal Problem:   Acute pulmonary embolism (HCC) Active Problems:   CAP (community acquired pneumonia)   Cognitive impairment   History of GI bleed   Pulmonary nodule   Diabetes mellitus type II, controlled   Hyperlipidemia associated with type 2 diabetes mellitus   Obstructive sleep  apnea   Essential hypertension   BPH (benign prostatic hyperplasia)   Pancreatic cyst   Acute encephalopathy   Anxiety and depression   Altered mental status  Acute PE: Involving segmental RUL and RLL. Normal RV by echo, negative BL LE venous U/S.  - Continue eliquis 5mg  po BID (having completed load)   Deconditioning: Requires rehabilitation. CIR was pursued but denied after peer-to-peer. CSW has sent referrals to multiple SNFs.  - Patient is medically stable for discharge once SNF bed is available.    RLL CAP: Vs. infarct on initial CT. This was treated x5 days CTX, azithromycin.    Dementia:  - Delirium precautions, continue aricept   History of GI bleed: No current gross bleeding on new anticoagulation. - Ferrous sulfate 325 mg PO every other day - Protonix 40 mg PO daily     Pulmonary nodule: 12 mm left lower lobe pulmonary nodule, similar to prior. This was evaluated by PET-CT 10/28/2022 and found to be non hypermetabolic.    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    HLD:  - Continue rosuvastatin 20mg     T2DM: At goal for age with HbA1c 7.1%.  - Continue metformin with no further contrast planned, continue linagliptin and mod SSI     DVT prophylaxis: eliquis Code Status: DNR Family Communication: none Disposition:   Status is: Inpatient Remains inpatient appropriate because: awaiting placement   Consultants:  none  Procedures:  LE [place] Summary:  BILATERAL:  - No evidence of deep vein thrombosis seen in the lower extremities,  bilaterally.  -No evidence of popliteal cyst, bilaterally.   Echo IMPRESSIONS     1. Left ventricular ejection fraction, by estimation, is 60 to 65%. The  left ventricle has normal function. The left ventricle has no regional  wall motion abnormalities. There is mild left ventricular hypertrophy.  Left ventricular diastolic parameters  are consistent with Grade I diastolic  dysfunction (impaired relaxation).   2. Right ventricular systolic function is normal. The right ventricular  size is normal.   3. The mitral valve was not well visualized. No evidence of mitral valve  regurgitation.   4. The aortic valve was not well visualized. Aortic valve regurgitation  is not visualized. No aortic stenosis is present.   Conclusion(s)/Recommendation(s): Windows are challenging, study appears  negative for RV strain.   Antimicrobials:  Anti-infectives (From admission, onward)    Start     Dose/Rate Route Frequency Ordered Stop   04/21/23 1600  azithromycin (ZITHROMAX) tablet 500 mg        500 mg Oral Daily 04/21/23 0950 04/23/23 0947   04/20/23 1000  azithromycin (ZITHROMAX) 500 mg in sodium chloride 0.9 % 250 mL IVPB  Status:  Discontinued        500 mg 250 mL/hr over 60 Minutes Intravenous Every 24 hours 04/19/23 1656 04/19/23 1816   04/20/23 0000  cefTRIAXone (ROCEPHIN) 2 g in sodium chloride 0.9 % 100 mL IVPB        2 g 200 mL/hr over 30 Minutes Intravenous Every 24 hours 04/19/23 1655 04/24/23 0700   04/19/23 2200  azithromycin (ZITHROMAX) 500 mg in sodium chloride 0.9 % 250 mL IVPB  Status:  Discontinued        500 mg 250 mL/hr over 60 Minutes Intravenous Every 24 hours 04/19/23 1816 04/21/23 0950   04/19/23 1700  vancomycin (VANCOREADY) IVPB 750 mg/150 mL       Placed in "Followed by" Linked Group   750 mg 150 mL/hr over 60 Minutes Intravenous  Once 04/19/23 1533 04/19/23 2200   04/19/23 1700  azithromycin (ZITHROMAX) 500 mg in sodium chloride 0.9 % 250 mL IVPB  Status:  Discontinued        500 mg 250 mL/hr over 60 Minutes Intravenous Every 24 hours 04/19/23 1655 04/19/23 1656   04/19/23 1545  vancomycin (VANCOCIN) IVPB 1000 mg/200 mL premix       Placed in "Followed by" Linked Group   1,000 mg 200 mL/hr over 60 Minutes Intravenous  Once 04/19/23 1533 04/19/23 2035   04/19/23 1545  piperacillin-tazobactam (ZOSYN) IVPB 3.375 g        3.375 g 100 mL/hr  over 30 Minutes Intravenous  Once 04/19/23 1533 04/19/23 1752       Subjective: No complaints  Objective: Vitals:   04/30/23 0448 04/30/23 0729 04/30/23 1144 04/30/23 1550  BP: 117/74 120/73 105/65 109/69  Pulse: 75 85 92 90  Resp: 20 16 16 20   Temp: 98.5 F (36.9 C) 98 F (36.7 C) 97.8 F (36.6 C) 98 F (36.7 C)  TempSrc: Oral Oral Oral Oral  SpO2: 96% 95% 94%   Weight: 74.5 kg     Height:        Intake/Output Summary (Last 24 hours) at 04/30/2023 1911 Last data filed at 04/30/2023 1300 Gross per 24 hour  Intake 360 ml  Output 1100 ml  Net -740 ml   Filed Weights   04/28/23 0500 04/29/23 0403 04/30/23 0448  Weight: 76 kg 76.3 kg 74.5 kg    Examination:  General: No acute distress. Cardiovascular: RRR Lungs: unlabored Neurological: Alert and oriented 3. Moves all extremities 4 with equal strength. Cranial nerves II through XII grossly intact. Extremities: No clubbing or cyanosis. No edema.  Data Reviewed: I have personally reviewed following labs and imaging studies  CBC: No results for input(s): "WBC", "NEUTROABS", "HGB", "HCT", "MCV", "PLT" in the last 168 hours.  Basic Metabolic Panel: No results for input(s): "NA", "K", "CL", "CO2", "GLUCOSE", "BUN", "CREATININE", "CALCIUM", "MG", "PHOS" in the last 168 hours.  GFR: Estimated Creatinine Clearance: 66.7 mL/min (by C-G formula based on SCr of 0.9 mg/dL).  Liver Function Tests: No results for input(s): "AST", "ALT", "ALKPHOS", "BILITOT", "PROT", "ALBUMIN" in the last 168 hours.  CBG: Recent Labs  Lab 04/29/23 1559 04/29/23 2146 04/30/23 0727 04/30/23 1143 04/30/23 1702  GLUCAP 119* 167* 157* 146* 203*     No results found for this or any previous visit (from the past 240 hours).       Radiology Studies: No results found.      Scheduled Meds:  amLODipine  5 mg Oral Daily   apixaban  5 mg Oral BID   donepezil  10 mg Oral QHS   feeding supplement  237 mL Oral BID BM   ferrous  sulfate  325 mg Oral QODAY   insulin aspart  0-15 Units Subcutaneous TID WC   insulin aspart  0-5 Units Subcutaneous QHS   irbesartan  300 mg Oral Daily   linagliptin  5 mg Oral Daily   metFORMIN  1,000 mg Oral BID WC   pantoprazole  40 mg Oral Daily   polyethylene glycol  17 g Oral Daily   rosuvastatin  20 mg Oral Daily   Continuous Infusions:   LOS: 9 days    Time spent: over 30 min    Lacretia Nicks, MD [facility]   To contact the attending provider between 7A-7P or the covering provider during after hours 7P-7A, please log into the web site [email or web address] and access using universal [place] password for that web site. If you do not have the password, please call the hospital operator.  04/30/2023, 7:11 PM

## 2023-04-30 NOTE — Plan of Care (Signed)
°  Problem: Coping: Goal: Ability to adjust to condition or change in health will improve Outcome: Progressing   Problem: Fluid Volume: Goal: Ability to maintain a balanced intake and output will improve Outcome: Progressing   Problem: Health Behavior/Discharge Planning: Goal: Ability to identify and utilize available resources and services will improve Outcome: Progressing   Problem: Metabolic: Goal: Ability to maintain appropriate glucose levels will improve Outcome: Progressing   Problem: Nutritional: Goal: Maintenance of adequate nutrition will improve Outcome: Progressing Goal: Progress toward achieving an optimal weight will improve Outcome: Progressing   Problem: Skin Integrity: Goal: Risk for impaired skin integrity will decrease Outcome: Progressing   Problem: Education: Goal: Knowledge of General Education information will improve Description: Including pain rating scale, medication(s)/side effects and non-pharmacologic comfort measures Outcome: Progressing   Problem: Clinical Measurements: Goal: Will remain free from infection Outcome: Progressing Goal: Diagnostic test results will improve Outcome: Progressing Goal: Respiratory complications will improve Outcome: Progressing Goal: Cardiovascular complication will be avoided Outcome: Progressing   Problem: Activity: Goal: Risk for activity intolerance will decrease Outcome: Progressing   Problem: Nutrition: Goal: Adequate nutrition will be maintained Outcome: Progressing   Problem: Coping: Goal: Level of anxiety will decrease Outcome: Progressing   Problem: Elimination: Goal: Will not experience complications related to bowel motility Outcome: Progressing Goal: Will not experience complications related to urinary retention Outcome: Progressing   Problem: Pain Management: Goal: General experience of comfort will improve Outcome: Progressing   Problem: Safety: Goal: Ability to remain free from  injury will improve Outcome: Progressing   Problem: Skin Integrity: Goal: Risk for impaired skin integrity will decrease Outcome: Progressing   Problem: Activity: Goal: Ability to tolerate increased activity will improve Outcome: Progressing

## 2023-05-01 DIAGNOSIS — I9589 Other hypotension: Secondary | ICD-10-CM

## 2023-05-01 DIAGNOSIS — I959 Hypotension, unspecified: Secondary | ICD-10-CM

## 2023-05-01 LAB — COMPREHENSIVE METABOLIC PANEL
ALT: 22 U/L (ref 0–44)
AST: 18 U/L (ref 15–41)
Albumin: 3.2 g/dL — ABNORMAL LOW (ref 3.5–5.0)
Alkaline Phosphatase: 74 U/L (ref 38–126)
Anion gap: 12 (ref 5–15)
BUN: 22 mg/dL (ref 8–23)
CO2: 25 mmol/L (ref 22–32)
Calcium: 9.5 mg/dL (ref 8.9–10.3)
Chloride: 99 mmol/L (ref 98–111)
Creatinine, Ser: 1.01 mg/dL (ref 0.61–1.24)
GFR, Estimated: >60 mL/min (ref >60)
Glucose, Bld: 149 mg/dL — ABNORMAL HIGH (ref 70–99)
Potassium: 4.6 mmol/L (ref 3.5–5.1)
Sodium: 136 mmol/L (ref 135–145)
Total Bilirubin: 0.8 mg/dL (ref <1.2)
Total Protein: 6.5 g/dL (ref 6.5–8.1)

## 2023-05-01 LAB — CBC WITH DIFFERENTIAL/PLATELET
Abs Immature Granulocytes: 0.09 10*3/uL — ABNORMAL HIGH (ref 0.00–0.07)
Basophils Absolute: 0.1 10*3/uL (ref 0.0–0.1)
Basophils Relative: 1 %
Eosinophils Absolute: 0.2 10*3/uL (ref 0.0–0.5)
Eosinophils Relative: 2 %
HCT: 41.4 % (ref 39.0–52.0)
Hemoglobin: 14.2 g/dL (ref 13.0–17.0)
Immature Granulocytes: 1 %
Lymphocytes Relative: 15 %
Lymphs Abs: 1.7 10*3/uL (ref 0.7–4.0)
MCH: 30.7 pg (ref 26.0–34.0)
MCHC: 34.3 g/dL (ref 30.0–36.0)
MCV: 89.4 fL (ref 80.0–100.0)
Monocytes Absolute: 1 10*3/uL (ref 0.1–1.0)
Monocytes Relative: 9 %
Neutro Abs: 7.8 10*3/uL — ABNORMAL HIGH (ref 1.7–7.7)
Neutrophils Relative %: 72 %
Platelets: 297 10*3/uL (ref 150–400)
RBC: 4.63 MIL/uL (ref 4.22–5.81)
RDW: 13.2 % (ref 11.5–15.5)
WBC: 10.8 10*3/uL — ABNORMAL HIGH (ref 4.0–10.5)
nRBC: 0 % (ref 0.0–0.2)

## 2023-05-01 LAB — GLUCOSE, CAPILLARY
Glucose-Capillary: 196 mg/dL — ABNORMAL HIGH (ref 70–99)
Glucose-Capillary: 219 mg/dL — ABNORMAL HIGH (ref 70–99)
Glucose-Capillary: 135 mg/dL — ABNORMAL HIGH (ref 70–99)
Glucose-Capillary: 143 mg/dL — ABNORMAL HIGH (ref 70–99)

## 2023-05-01 LAB — PHOSPHORUS: Phosphorus: 4 mg/dL (ref 2.5–4.6)

## 2023-05-01 LAB — MAGNESIUM: Magnesium: 1.8 mg/dL (ref 1.7–2.4)

## 2023-05-01 MED ORDER — LACTATED RINGERS IV BOLUS: 500.0000 mL | Freq: Once | INTRAVENOUS | Status: DC

## 2023-05-01 NOTE — Progress Notes (Signed)
PROGRESS NOTE    Walter [name]  UVO:[id number] DOB: [birth year] DOA: 04/19/2023 PCP: Walter Majestic, MD  Chief Complaint  Patient presents with   Altered Mental Status    Brief Narrative:   Walter [name] is an 82 yo male with PMH chronic dementia, HTN, HLD, DM II, OSA on CPAP, BPH, PTSD/anxiety/depression who presented with altered mentation.   Note, patient recently admitted from 11/29 until 12/1.  Admitted with altered mental status in the setting of sepsis secondary to aspiration pneumonia at the bilateral bases.  AMS also believed to have Walter [name] delirium component in the setting of chronic dementia.  Was treated with antibiotics with improvement.  On discharge patient was alert and oriented to self only.  Plan for outpatient neurology follow-up.   Patient did follow-up with neurology who recommended continue donepezil and continued Lexapro/Seroquel for PTSD/anxiety/depression.  Further neuropsychiatric testing to be repeated in 2025.   Patient has not returned to previous baseline prior to that admission and is unclear if he will.  However for the past 2 to 3 days prior to admission, he has had significant worsening initially with increasing weakness and now significantly increased confusion with some associated agitation. His confusion appeared similar to how it was just prior to his recent admission.   He underwent workup with imaging studies in the ER.  CT was notable for acute PE involving RUL and RLL; there was also groundglass opacities in the right lower lobe concerning for infection. He was started on heparin drip, oxygen, and antibiotics and admitted for further monitoring.  Assessment & Plan:   Principal Problem:   Acute pulmonary embolism (HCC) Active Problems:   CAP (community acquired pneumonia)   Cognitive impairment   History of GI bleed   Pulmonary nodule   Diabetes mellitus type II, controlled   Hyperlipidemia associated with type 2 diabetes mellitus   Obstructive sleep  apnea   Essential hypertension   BPH (benign prostatic hyperplasia)   Pancreatic cyst   Acute encephalopathy   Anxiety and depression   Altered mental status  Hypotension No clear cause - he's asymptomatic SBP 80's Hold amlodipine and arb Bolus 500 cc and follow - again, he's asymptomatic and was already up and walking today - unclear cause  Acute PE: Involving segmental RUL and RLL. Normal RV by echo, negative BL LE venous U/S.  - Continue eliquis 5mg  po BID (having completed load)   Deconditioning: Requires rehabilitation. CIR was pursued but denied after peer-to-peer. CSW has sent referrals to multiple SNFs.  - Patient is medically stable for discharge once SNF bed is available.    RLL CAP: Vs. infarct on initial CT. This was treated x5 days CTX, azithromycin.    Dementia:  - Delirium precautions, continue aricept   History of GI bleed: No current gross bleeding on new anticoagulation. - Ferrous sulfate 325 mg PO every other day - Protonix 40 mg PO daily     Pulmonary nodule: 12 mm left lower lobe pulmonary nodule, similar to prior. This was evaluated by PET-CT 10/28/2022 and found to be non hypermetabolic.    Pancreatic cyst - 2 year follow up    HTN: as above, hypotension - antihypertensives on hold   HLD:  - Continue rosuvastatin 20mg     T2DM: At goal for age with HbA1c 7.1%.  - Continue metformin with no further contrast planned, continue linagliptin and mod SSI     DVT prophylaxis: eliquis Code Status: DNR Family Communication: none Disposition:   Status is:  Inpatient Remains inpatient appropriate because: awaiting placement   Consultants:  none  Procedures:  LE [place] Summary:  BILATERAL:  - No evidence of deep vein thrombosis seen in the lower extremities,  bilaterally.  -No evidence of popliteal cyst, bilaterally.   Echo IMPRESSIONS     1. Left ventricular ejection fraction, by estimation, is 60 to 65%. The  left ventricle has normal  function. The left ventricle has no regional  wall motion abnormalities. There is mild left ventricular hypertrophy.  Left ventricular diastolic parameters  are consistent with Grade I diastolic dysfunction (impaired relaxation).   2. Right ventricular systolic function is normal. The right ventricular  size is normal.   3. The mitral valve was not well visualized. No evidence of mitral valve  regurgitation.   4. The aortic valve was not well visualized. Aortic valve regurgitation  is not visualized. No aortic stenosis is present.   Conclusion(s)/Recommendation(s): Windows are challenging, study appears  negative for RV strain.   Antimicrobials:  Anti-infectives (From admission, onward)    Start     Dose/Rate Route Frequency Ordered Stop   04/21/23 1600  azithromycin (ZITHROMAX) tablet 500 mg        500 mg Oral Daily 04/21/23 0950 04/23/23 0947   04/20/23 1000  azithromycin (ZITHROMAX) 500 mg in sodium chloride 0.9 % 250 mL IVPB  Status:  Discontinued        500 mg 250 mL/hr over 60 Minutes Intravenous Every 24 hours 04/19/23 1656 04/19/23 1816   04/20/23 0000  cefTRIAXone (ROCEPHIN) 2 g in sodium chloride 0.9 % 100 mL IVPB        2 g 200 mL/hr over 30 Minutes Intravenous Every 24 hours 04/19/23 1655 04/24/23 0700   04/19/23 2200  azithromycin (ZITHROMAX) 500 mg in sodium chloride 0.9 % 250 mL IVPB  Status:  Discontinued        500 mg 250 mL/hr over 60 Minutes Intravenous Every 24 hours 04/19/23 1816 04/21/23 0950   04/19/23 1700  vancomycin (VANCOREADY) IVPB 750 mg/150 mL       Placed in "Followed by" Linked Group   750 mg 150 mL/hr over 60 Minutes Intravenous  Once 04/19/23 1533 04/19/23 2200   04/19/23 1700  azithromycin (ZITHROMAX) 500 mg in sodium chloride 0.9 % 250 mL IVPB  Status:  Discontinued        500 mg 250 mL/hr over 60 Minutes Intravenous Every 24 hours 04/19/23 1655 04/19/23 1656   04/19/23 1545  vancomycin (VANCOCIN) IVPB 1000 mg/200 mL premix       Placed in  "Followed by" Linked Group   1,000 mg 200 mL/hr over 60 Minutes Intravenous  Once 04/19/23 1533 04/19/23 2035   04/19/23 1545  piperacillin-tazobactam (ZOSYN) IVPB 3.375 g        3.375 g 100 mL/hr over 30 Minutes Intravenous  Once 04/19/23 1533 04/19/23 1752       Subjective: Denies SOB, lightheadedness, CP  Objective: Vitals:   05/01/23 0000 05/01/23 0400 05/01/23 0442 05/01/23 1402  BP:   (!) 121/93 (!) 88/61  Pulse:   78 (!) 109  Resp:   18 16  Temp:   97.8 F (36.6 C) (!) 97.3 F (36.3 C)  TempSrc:   Oral Oral  SpO2: 95% 95% 94% 92%  Weight:   75.7 kg   Height:        Intake/Output Summary (Last 24 hours) at 05/01/2023 1429 Last data filed at 05/01/2023 0449 Gross per 24 hour  Intake --  Output 400 ml  Net -400 ml   Filed Weights   04/29/23 0403 04/30/23 0448 05/01/23 0442  Weight: 76.3 kg 74.5 kg 75.7 kg    Examination:  General: No acute distress. Cardiovascular: RRR Lungs:unlabored Neurological: Alert. Moves all extremities 4 with equal strength. Cranial nerves II through XII grossly intact. Extremities: No clubbing or cyanosis. No edema.  Data Reviewed: I have personally reviewed following labs and imaging studies  CBC: Recent Labs  Lab 05/01/23 0355  WBC 10.8*  NEUTROABS 7.8*  HGB 14.2  HCT 41.4  MCV 89.4  PLT 297    Basic Metabolic Panel: Recent Labs  Lab 05/01/23 0355  NA 136  K 4.6  CL 99  CO2 25  GLUCOSE 149*  BUN 22  CREATININE 1.01  CALCIUM 9.5  MG 1.8  PHOS 4.0    GFR: Estimated Creatinine Clearance: 60.4 mL/min (by C-G formula based on SCr of 1.01 mg/dL).  Liver Function Tests: Recent Labs  Lab 05/01/23 0355  AST 18  ALT 22  ALKPHOS 74  BILITOT 0.8  PROT 6.5  ALBUMIN 3.2*    CBG: Recent Labs  Lab 04/30/23 1143 04/30/23 1702 04/30/23 2108 05/01/23 0752 05/01/23 1217  GLUCAP 146* 203* 207* 196* 219*     No results found for this or any previous visit (from the past 240 hours).        Radiology Studies: No results found.      Scheduled Meds:  apixaban  5 mg Oral BID   donepezil  10 mg Oral QHS   feeding supplement  237 mL Oral BID BM   ferrous sulfate  325 mg Oral QODAY   insulin aspart  0-15 Units Subcutaneous TID WC   insulin aspart  0-5 Units Subcutaneous QHS   linagliptin  5 mg Oral Daily   metFORMIN  1,000 mg Oral BID WC   pantoprazole  40 mg Oral Daily   polyethylene glycol  17 g Oral Daily   rosuvastatin  20 mg Oral Daily   Continuous Infusions:  lactated ringers       LOS: 10 days    Time spent: over 30 min    Lacretia Nicks, MD [facility]   To contact the attending provider between 7A-7P or the covering provider during after hours 7P-7A, please log into the web site [email or web address] and access using universal [place] password for that web site. If you do not have the password, please call the hospital operator.  05/01/2023, 2:29 PM

## 2023-05-01 NOTE — Progress Notes (Signed)
Mobility Specialist Progress Note:    05/01/23 1100  Mobility  Activity Ambulated with assistance in hallway  Level of Assistance Minimal assist, patient does 75% or more  Assistive Device Front wheel walker  Distance Ambulated (ft) 80 ft  Activity Response Tolerated well  Mobility Referral Yes  Mobility visit 1 Mobility  Mobility Specialist Start Time (ACUTE ONLY) 1121  Mobility Specialist Stop Time (ACUTE ONLY) 1131  Mobility Specialist Time Calculation (min) (ACUTE ONLY) 10 min   Pt received in chair, agreeable to mobility. Showed much improvement in posture and walker management this date, though still requiring verbal cues. No complaints throughout. Pt left in chair with call bell and all needs met. Chair alarm on.  Walter [name] Mobility Specialist Please contact via Special educational needs teacher or Rehab office at [phone]

## 2023-05-01 NOTE — TOC Progression Note (Signed)
Transition of Care [facility]) - Progression Note    Patient Details  Name: Walter [name] MRN: [id number] Date of Birth: Sep 17, 1940  Transition of Care [facility]) CM/SW Contact  Eduard Roux, [place] Phone Number: 05/01/2023, 12:51 PM  Clinical Narrative:     CSW spoke with patient's daughter,[name]. CSW discussed with family current bed status and encourage to review and select a facility as soon as possible. Family states understanding.   Sent message to pending SNFs  - request they review referral  [facility]- no [facility]- sent message- waiting on response [facility]- reviewing -waiting on response  [facility]- left voice message    TOC will continue to follow and assist with discharge planning.   Antony Blackbird, MSW, LCSW Clinical Social Worker      Barriers to Discharge: Continued Medical Work up  Expected Discharge Plan and Services In-house Referral: Clinical Social Work     Living arrangements for the past 2 months: Single Family Home                           HH Arranged:  (Active with [facility]  for PT OT SLP)           Social Determinants of Health (SDOH) Interventions SDOH Screenings   Food Insecurity: No Food Insecurity (04/20/2023)  Housing: Low Risk  (04/20/2023)  Recent Concern: Housing - High Risk (03/31/2023)  Transportation Needs: No Transportation Needs (04/20/2023)  Utilities: Not At Risk (04/20/2023)  Depression (PHQ2-9): High Risk (04/11/2023)  Financial Resource Strain: Low Risk  (05/12/2022)  Physical Activity: Sufficiently Active (05/12/2022)  Social Connections: Moderately Integrated (05/12/2022)  Stress: No Stress Concern Present (05/12/2022)  Tobacco Use: Medium Risk (04/19/2023)    Readmission Risk Interventions     No data to display

## 2023-05-01 NOTE — Plan of Care (Signed)
  Problem: Coping: Goal: Ability to adjust to condition or change in health will improve Outcome: Progressing   Problem: Health Behavior/Discharge Planning: Goal: Ability to identify and utilize available resources and services will improve Outcome: Progressing   Problem: Metabolic: Goal: Ability to maintain appropriate glucose levels will improve Outcome: Progressing   Problem: Nutritional: Goal: Maintenance of adequate nutrition will improve Outcome: Progressing   Problem: Skin Integrity: Goal: Risk for impaired skin integrity will decrease Outcome: Progressing   Problem: Education: Goal: Knowledge of General Education information will improve Description: Including pain rating scale, medication(s)/side effects and non-pharmacologic comfort measures Outcome: Progressing   Problem: Clinical Measurements: Goal: Cardiovascular complication will be avoided Outcome: Progressing   Problem: Activity: Goal: Risk for activity intolerance will decrease Outcome: Progressing   Problem: Nutrition: Goal: Adequate nutrition will be maintained Outcome: Progressing   Problem: Coping: Goal: Level of anxiety will decrease Outcome: Progressing   Problem: Safety: Goal: Ability to remain free from injury will improve Outcome: Progressing   Problem: Skin Integrity: Goal: Risk for impaired skin integrity will decrease Outcome: Progressing   Problem: Activity: Goal: Ability to tolerate increased activity will improve Outcome: Progressing

## 2023-05-01 NOTE — Care Management Important Message (Signed)
Important Message  Patient Details  Name: Walter [name] MRN: [id number] Date of Birth: 26-Dec-1940   Important Message Given:  Yes - Medicare IM     Renie Ora 05/01/2023, 10:59 AM

## 2023-05-02 ENCOUNTER — Ambulatory Visit: Payer: Self-pay | Admitting: Licensed Clinical Social Worker

## 2023-05-02 DIAGNOSIS — I2699 Other pulmonary embolism without acute cor pulmonale: Secondary | ICD-10-CM | POA: Diagnosis not present

## 2023-05-02 LAB — COMPREHENSIVE METABOLIC PANEL
ALT: 22 U/L (ref 0–44)
AST: 14 U/L — ABNORMAL LOW (ref 15–41)
Albumin: 3.2 g/dL — ABNORMAL LOW (ref 3.5–5.0)
Alkaline Phosphatase: 64 U/L (ref 38–126)
Anion gap: 9 (ref 5–15)
BUN: 31 mg/dL — ABNORMAL HIGH (ref 8–23)
CO2: 27 mmol/L (ref 22–32)
Calcium: 9.4 mg/dL (ref 8.9–10.3)
Chloride: 99 mmol/L (ref 98–111)
Creatinine, Ser: 1.09 mg/dL (ref 0.61–1.24)
GFR, Estimated: >60 mL/min (ref >60)
Glucose, Bld: 148 mg/dL — ABNORMAL HIGH (ref 70–99)
Potassium: 4.4 mmol/L (ref 3.5–5.1)
Sodium: 135 mmol/L (ref 135–145)
Total Bilirubin: 0.8 mg/dL (ref 0.0–1.2)
Total Protein: 6.6 g/dL (ref 6.5–8.1)

## 2023-05-02 LAB — CBC WITH DIFFERENTIAL/PLATELET
Abs Immature Granulocytes: 0.08 10*3/uL — ABNORMAL HIGH (ref 0.00–0.07)
Basophils Absolute: 0.1 10*3/uL (ref 0.0–0.1)
Basophils Relative: 1 %
Eosinophils Absolute: 0.2 10*3/uL (ref 0.0–0.5)
Eosinophils Relative: 2 %
HCT: 38.9 % — ABNORMAL LOW (ref 39.0–52.0)
Hemoglobin: 13.6 g/dL (ref 13.0–17.0)
Immature Granulocytes: 1 %
Lymphocytes Relative: 12 %
Lymphs Abs: 1.3 10*3/uL (ref 0.7–4.0)
MCH: 31.1 pg (ref 26.0–34.0)
MCHC: 35 g/dL (ref 30.0–36.0)
MCV: 89 fL (ref 80.0–100.0)
Monocytes Absolute: 0.7 10*3/uL (ref 0.1–1.0)
Monocytes Relative: 7 %
Neutro Abs: 8.5 10*3/uL — ABNORMAL HIGH (ref 1.7–7.7)
Neutrophils Relative %: 77 %
Platelets: 289 10*3/uL (ref 150–400)
RBC: 4.37 MIL/uL (ref 4.22–5.81)
RDW: 13.1 % (ref 11.5–15.5)
WBC: 10.8 10*3/uL — ABNORMAL HIGH (ref 4.0–10.5)
nRBC: 0 % (ref 0.0–0.2)

## 2023-05-02 LAB — GLUCOSE, CAPILLARY
Glucose-Capillary: 231 mg/dL — ABNORMAL HIGH (ref 70–99)
Glucose-Capillary: 156 mg/dL — ABNORMAL HIGH (ref 70–99)
Glucose-Capillary: 170 mg/dL — ABNORMAL HIGH (ref 70–99)
Glucose-Capillary: 110 mg/dL — ABNORMAL HIGH (ref 70–99)

## 2023-05-02 LAB — MAGNESIUM: Magnesium: 1.8 mg/dL (ref 1.7–2.4)

## 2023-05-02 LAB — PHOSPHORUS: Phosphorus: 3.6 mg/dL (ref 2.5–4.6)

## 2023-05-02 NOTE — Evaluation (Addendum)
 Occupational Therapy Treatment Patient Details Name: Walter [name] MRN: [id number] DOB: March 02, 1941 Today's Date: 05/02/2023   History of Present Illness Patient is an 82 y.o. male presented to ED with AMS. Pt recently admitted 11/29-12/1 for AMS due to sepsis secondary to PNA. CT of the chest abdomen and pelvis with contrast demonstrated a segmental/subsegmental pulmonary emboli at right upper lobe and right lower lobe. PMH significant of hypertension, hyperlipidemia, diabetes, OSA on CPAP, BPH, aspiration, PTSD, anxiety, depression, dementia, pancreatic cyst.   Clinical Impression   Pt c/o no discomfort or pain, able to fully participate. Pt family present during session. Pt lives with wife who is available all day, PLOF to thanksgiving Pt independent and driving. Since then has declined, currently requires mod A for most ADLs/mobility due to poor problem solving, requires max cueing for sequencing and safety for all tasks. Pt has poor sitting and standing balance with posterior lean, unable to self correct without physical assistance. Pt would benefit from postacute rehab <3hrs/day to improve balance, safety awareness, and functional independence with ADLs/mobility, will follow acutely to progress as able.       If plan is discharge home, recommend the following: A lot of help with walking and/or transfers;A lot of help with bathing/dressing/bathroom;Direct supervision/assist for medications management;Assistance with cooking/housework;Help with stairs or ramp for entrance;Assist for transportation;Supervision due to cognitive status    Functional Status Assessment  Patient has had a recent decline in their functional status and demonstrates the ability to make significant improvements in function in a reasonable and predictable amount of time.  Equipment Recommendations  None recommended by OT    Recommendations for Other Services       Precautions / Restrictions Precautions Precautions:  Fall Precaution Comments: R toe amputations affecting balance; does better with shoes Restrictions Weight Bearing Restrictions Per Provider Order: No      Mobility Bed Mobility Overal bed mobility: Needs Assistance Bed Mobility: Supine to Sit, Sit to Supine     Supine to sit: HOB elevated, Used rails, Min assist Sit to supine: Min assist, HOB elevated, Used rails   General bed mobility comments: min A in/out of bed, consistent cueing for safety and sequencing, frequently forgets what he is doing as he is doing it.    Transfers Overall transfer level: Needs assistance Equipment used: Rolling walker (2 wheels) Transfers: Sit to/from Stand, Bed to chair/wheelchair/BSC Sit to Stand: Min assist     Step pivot transfers: Min assist     General transfer comment: min A for balance and safety. frequent LOB posteriorly      Balance Overall balance assessment: Needs assistance Sitting-balance support: Feet supported, No upper extremity supported Sitting balance-Leahy Scale: Poor Sitting balance - Comments: Pt requires UE and therapist support to maintain sitting balance, posterior lean, unable to correct during entire session without support.   Standing balance support: During functional activity, Reliant on assistive device for balance, Bilateral upper extremity supported Standing balance-Leahy Scale: Poor Standing balance comment: poor static standing balance, posterior lean, min A from therapist to correct, cueing for safety and hand placement.                           ADL either performed or assessed with clinical judgement   ADL Overall ADL's : Needs assistance/impaired Eating/Feeding: Set up   Grooming: Supervision/safety;Sitting;Wash/dry face   Upper Body Bathing: Minimal assistance;Sitting   Lower Body Bathing: Sitting/lateral leans;Sit to/from stand;+2 for safety/equipment;Moderate assistance;+2 for physical assistance  Upper Body Dressing : Minimal  assistance;Sitting   Lower Body Dressing: Moderate assistance;Sit to/from stand   Toilet Transfer: Minimal assistance;Moderate assistance;Rolling walker (2 wheels)   Toileting- Clothing Manipulation and Hygiene: Moderate assistance;Sit to/from stand       Functional mobility during ADLs: Minimal assistance;Rolling walker (2 wheels) General ADL Comments: Pt has poor sitting and static standing balance, posterior lean, not able to correct without physical assist. Pt requires cueing for sequencing and safety. Able to physical perform most tasks but due to poor cognition Pt requires min to mod for all ADLs     Vision Baseline Vision/History: 0 No visual deficits Ability to See in Adequate Light: 0 Adequate Patient Visual Report: No change from baseline       Perception         Praxis         Pertinent Vitals/Pain Pain Assessment Pain Assessment: No/denies pain     Extremity/Trunk Assessment Upper Extremity Assessment Upper Extremity Assessment: Overall WFL for tasks assessed           Communication Communication Communication: No apparent difficulties   Cognition Arousal: Alert Behavior During Therapy: WFL for tasks assessed/performed Overall Cognitive Status: History of cognitive impairments - at baseline                                 General Comments: Pt requires frequent cueing for safety, sequencing, forgets what he is doing as he is doing it. A/Ox1.     General Comments       Exercises     Shoulder Instructions      Home Living Family/patient expects to be discharged to:: Private residence Living Arrangements: Spouse/significant other;Children Available Help at Discharge: Family;Available 24 hours/day Type of Home: House Home Access: Stairs to enter [employer] of Steps: 2 Entrance Stairs-Rails: Left Home Layout: Two level;Able to live on main level with bedroom/bathroom Alternate Level Stairs-Number of Steps: no  chairlift, flight   Bathroom Shower/Tub: Chief Strategy Officer: Standard Bathroom Accessibility: Yes How Accessible: Accessible via wheelchair;Accessible via walker Home Equipment: Rolling Walker (2 wheels);Shower seat;Grab bars - tub/shower;Hand held shower head;Wheelchair - manual;BSC/3in1   Additional Comments: Pt lives with wife who is there all day.      Prior Functioning/Environment Prior Level of Function : Independent/Modified Independent             Mobility Comments: ind prior to thanksgiving ADLs Comments: reports ind prior to thanksgiving        OT Problem List: Decreased strength;Decreased range of motion;Decreased activity tolerance;Impaired balance (sitting and/or standing);Decreased cognition;Decreased coordination;Decreased safety awareness      OT Treatment/Interventions: Self-care/ADL training;Therapeutic exercise;DME and/or AE instruction;Energy conservation;Therapeutic activities;Patient/family education;Balance training;Cognitive remediation/compensation    OT Goals(Current goals can be found in the care plan section) Acute Rehab OT Goals Patient Stated Goal: not able to participate in goal setting OT Goal Formulation: With family Time For Goal Achievement: 05/16/23 Potential to Achieve Goals: Fair ADL Goals Pt Will Perform Grooming: with supervision;standing Pt Will Perform Lower Body Bathing: with set-up;with supervision;sit to/from stand Pt Will Transfer to Toilet: with supervision  OT Frequency: Min 1X/week    Co-evaluation              AM-PAC OT 6 Clicks Daily Activity     Outcome Measure Help from another person eating meals?: A Little Help from another person taking care of personal grooming?: A Little Help from  another person toileting, which includes using toliet, bedpan, or urinal?: A Lot Help from another person bathing (including washing, rinsing, drying)?: A Lot Help from another person to put on and taking off  regular upper body clothing?: A Lot Help from another person to put on and taking off regular lower body clothing?: A Lot 6 Click Score: 14   End of Session Equipment Utilized During Treatment: Gait belt;Rolling walker (2 wheels) Nurse Communication: Mobility status  Activity Tolerance: Patient tolerated treatment well Patient left: in bed;with call bell/phone within reach;with bed alarm set;with family/visitor present  OT Visit Diagnosis: Unsteadiness on feet (R26.81);Other abnormalities of gait and mobility (R26.89);Muscle weakness (generalized) (M62.81)                Time: 8352-8270 OT Time Calculation (min): 42 min Charges:  OT General Charges $OT Visit: 1 Visit OT Treatments $Self Care/Home Management : 23-37 mins $Therapeutic Activity: 8-22 mins  Maile Linford, OTR/L   Elouise JONELLE Bott 05/02/2023, 5:47 PM

## 2023-05-02 NOTE — Plan of Care (Signed)
  Problem: Skin Integrity: Goal: Risk for impaired skin integrity will decrease Outcome: Progressing   Problem: Education: Goal: Knowledge of General Education information will improve Description: Including pain rating scale, medication(s)/side effects and non-pharmacologic comfort measures Outcome: Progressing   Problem: Activity: Goal: Risk for activity intolerance will decrease Outcome: Progressing   Problem: Nutrition: Goal: Adequate nutrition will be maintained Outcome: Progressing

## 2023-05-02 NOTE — Progress Notes (Signed)
 PROGRESS NOTE    Walter [name]  FMW:[id number] DOB: November 06, 1940 DOA: 04/19/2023 PCP: Katrinka Garnette KIDD, MD  Chief Complaint  Patient presents with   Altered Mental Status    Brief Narrative:   [name] is an 82 yo male with PMH chronic dementia, HTN, HLD, DM II, OSA on CPAP, BPH, PTSD/anxiety/depression who presented with altered mentation.   Note, patient recently admitted from 11/29 until 12/1.  Admitted with altered mental status in the setting of sepsis secondary to aspiration pneumonia at the bilateral bases.  AMS also believed to have Walter [name] delirium component in the setting of chronic dementia.  Was treated with antibiotics with improvement.  On discharge patient was alert and oriented to self only.  Plan for outpatient neurology follow-up.   Patient did follow-up with neurology who recommended continue donepezil  and continued Lexapro/Seroquel  for PTSD/anxiety/depression.  Further neuropsychiatric testing to be repeated in 2025.   Patient has not returned to previous baseline prior to that admission and is unclear if he will.  However for the past 2 to 3 days prior to admission, he has had significant worsening initially with increasing weakness and now significantly increased confusion with some associated agitation. His confusion appeared similar to how it was just prior to his recent admission.   He underwent workup with imaging studies in the ER.  CT was notable for acute PE involving RUL and RLL; there was also groundglass opacities in the right lower lobe concerning for infection. He was started on heparin  drip, oxygen, and antibiotics and admitted for further monitoring.  Assessment & Plan:   Principal Problem:   Acute pulmonary embolism (HCC) Active Problems:   CAP (community acquired pneumonia)   Cognitive impairment   History of GI bleed   Pulmonary nodule   Diabetes mellitus type II, controlled   Hyperlipidemia associated with type 2 diabetes mellitus   Obstructive sleep  apnea   Essential hypertension   BPH (benign prostatic hyperplasia)   Pancreatic cyst   Acute encephalopathy   Anxiety and depression   Altered mental status   Arterial hypotension  Hypotension 12/30 - resolved Hold amlodipine  and arb  Acute PE: Involving segmental RUL and RLL. Normal RV by echo, negative BL LE venous U/S.  - Continue eliquis  5mg  po BID (having completed load)   Deconditioning: Requires rehabilitation. CIR was pursued but denied after peer-to-peer. CSW has sent referrals to multiple SNFs.  - Patient is medically stable for discharge once SNF bed is available.    RLL CAP: Vs. infarct on initial CT. This was treated x5 days CTX, azithromycin .    Dementia:  - Delirium precautions, continue aricept    History of GI bleed: No current gross bleeding on new anticoagulation. - Ferrous sulfate  325 mg PO every other day - Protonix  40 mg PO daily     Pulmonary nodule: 12 mm left lower lobe pulmonary nodule, similar to prior. This was evaluated by PET-CT 10/28/2022 and found to be non hypermetabolic.    Pancreatic cyst - 2 year follow up    HTN: as above, hypotension - antihypertensives on hold   HLD:  - Continue rosuvastatin  20mg     T2DM: At goal for age with HbA1c 7.1%.  - Continue metformin  with no further contrast planned, continue linagliptin  and mod SSI     DVT prophylaxis: eliquis  Code Status: DNR Family Communication: none Disposition:   Status is: Inpatient Remains inpatient appropriate because: awaiting placement   Consultants:  none  Procedures:  LE US  Summary:  BILATERAL:  -  No evidence of deep vein thrombosis seen in the lower extremities,  bilaterally.  -No evidence of popliteal cyst, bilaterally.   Echo IMPRESSIONS     1. Left ventricular ejection fraction, by estimation, is 60 to 65%. The  left ventricle has normal function. The left ventricle has no regional  wall motion abnormalities. There is mild left ventricular hypertrophy.   Left ventricular diastolic parameters  are consistent with Grade I diastolic dysfunction (impaired relaxation).   2. Right ventricular systolic function is normal. The right ventricular  size is normal.   3. The mitral valve was not well visualized. No evidence of mitral valve  regurgitation.   4. The aortic valve was not well visualized. Aortic valve regurgitation  is not visualized. No aortic stenosis is present.   Conclusion(s)/Recommendation(s): Windows are challenging, study appears  negative for RV strain.   Antimicrobials:  Anti-infectives (From admission, onward)    Start     Dose/Rate Route Frequency Ordered Stop   04/21/23 1600  azithromycin  (ZITHROMAX ) tablet 500 mg        500 mg Oral Daily 04/21/23 0950 04/23/23 0947   04/20/23 1000  azithromycin  (ZITHROMAX ) 500 mg in sodium chloride  0.9 % 250 mL IVPB  Status:  Discontinued        500 mg 250 mL/hr over 60 Minutes Intravenous Every 24 hours 04/19/23 1656 04/19/23 1816   04/20/23 0000  cefTRIAXone  (ROCEPHIN ) 2 g in sodium chloride  0.9 % 100 mL IVPB        2 g 200 mL/hr over 30 Minutes Intravenous Every 24 hours 04/19/23 1655 04/24/23 0700   04/19/23 2200  azithromycin  (ZITHROMAX ) 500 mg in sodium chloride  0.9 % 250 mL IVPB  Status:  Discontinued        500 mg 250 mL/hr over 60 Minutes Intravenous Every 24 hours 04/19/23 1816 04/21/23 0950   04/19/23 1700  vancomycin  (VANCOREADY) IVPB 750 mg/150 mL       Placed in Followed by Linked Group   750 mg 150 mL/hr over 60 Minutes Intravenous  Once 04/19/23 1533 04/19/23 2200   04/19/23 1700  azithromycin  (ZITHROMAX ) 500 mg in sodium chloride  0.9 % 250 mL IVPB  Status:  Discontinued        500 mg 250 mL/hr over 60 Minutes Intravenous Every 24 hours 04/19/23 1655 04/19/23 1656   04/19/23 1545  vancomycin  (VANCOCIN ) IVPB 1000 mg/200 mL premix       Placed in Followed by Linked Group   1,000 mg 200 mL/hr over 60 Minutes Intravenous  Once 04/19/23 1533 04/19/23 2035    04/19/23 1545  piperacillin -tazobactam (ZOSYN ) IVPB 3.375 g        3.375 g 100 mL/hr over 30 Minutes Intravenous  Once 04/19/23 1533 04/19/23 1752       Subjective: No complaints  Objective: Vitals:   05/01/23 1446 05/01/23 2018 05/02/23 0424 05/02/23 0758  BP: 95/63 (!) 100/58 103/65 (!) 116/58  Pulse: 86 86 79 93  Resp: 16 16 16 16   Temp:  98 F (36.7 C) 98 F (36.7 C) 97.6 F (36.4 C)  TempSrc:  Oral Oral Oral  SpO2: 94% 90% 98% 95%  Weight:      Height:        Intake/Output Summary (Last 24 hours) at 05/02/2023 1253 Last data filed at 05/02/2023 0801 Gross per 24 hour  Intake 240 ml  Output 700 ml  Net -460 ml   Filed Weights   04/29/23 0403 04/30/23 0448 05/01/23 0442  Weight: 76.3  kg 74.5 kg 75.7 kg    Examination:  General: No acute distress. Cardiovascular: RRR Lungs: unlabored Neurological: Alert. Moves all extremities 4 with equal strength. Cranial nerves II through XII grossly intact. Extremities: No clubbing or cyanosis. No edema.   Data Reviewed: I have personally reviewed following labs and imaging studies  CBC: Recent Labs  Lab 05/01/23 0355 05/02/23 0451  WBC 10.8* 10.8*  NEUTROABS 7.8* 8.5*  HGB 14.2 13.6  HCT 41.4 38.9*  MCV 89.4 89.0  PLT 297 289    Basic Metabolic Panel: Recent Labs  Lab 05/01/23 0355 05/02/23 0451  NA 136 135  K 4.6 4.4  CL 99 99  CO2 25 27  GLUCOSE 149* 148*  BUN 22 31*  CREATININE 1.01 1.09  CALCIUM  9.5 9.4  MG 1.8 1.8  PHOS 4.0 3.6    GFR: Estimated Creatinine Clearance: 55.9 mL/min (by C-G formula based on SCr of 1.09 mg/dL).  Liver Function Tests: Recent Labs  Lab 05/01/23 0355 05/02/23 0451  AST 18 14*  ALT 22 22  ALKPHOS 74 64  BILITOT 0.8 0.8  PROT 6.5 6.6  ALBUMIN 3.2* 3.2*    CBG: Recent Labs  Lab 05/01/23 1217 05/01/23 1557 05/01/23 2059 05/02/23 0759 05/02/23 1148  GLUCAP 219* 135* 143* 231* 156*     No results found for this or any previous visit (from the past  240 hours).       Radiology Studies: No results found.      Scheduled Meds:  apixaban   5 mg Oral BID   donepezil   10 mg Oral QHS   feeding supplement  237 mL Oral BID BM   ferrous sulfate   325 mg Oral QODAY   insulin  aspart  0-15 Units Subcutaneous TID WC   insulin  aspart  0-5 Units Subcutaneous QHS   linagliptin   5 mg Oral Daily   metFORMIN   1,000 mg Oral BID WC   pantoprazole   40 mg Oral Daily   polyethylene glycol  17 g Oral Daily   rosuvastatin   20 mg Oral Daily   Continuous Infusions:     LOS: 11 days    Time spent: over 30 min    Meliton Monte, MD [facility]   To contact the attending provider between 7A-7P or the covering provider during after hours 7P-7A, please log into the web site [email or web address] and access using universal [place] password for that web site. If you do not have the password, please call the hospital operator.  05/02/2023, 12:53 PM

## 2023-05-02 NOTE — Progress Notes (Signed)
 Physical Therapy Treatment Patient Details Name: Walter [name] MRN: [id number] DOB: 07/20/1940 Today's Date: 05/02/2023   History of Present Illness Patient is an 82 y.o. male presented to ED with AMS. Pt recently admitted 11/29-12/1 for AMS due to sepsis secondary to PNA. CT of the chest abdomen and pelvis with contrast demonstrated a segmental/subsegmental pulmonary emboli at right upper lobe and right lower lobe. PMH significant of hypertension, hyperlipidemia, diabetes, OSA on CPAP, BPH, aspiration, PTSD, anxiety, depression, dementia, pancreatic cyst.    PT Comments  Pt making steady progress. Combination of poor balance and poor cognition make pt high fall risk, Needs continued therapy to maximize independence and mobility while decr fall risk. Insurance denied AIR. Patient will benefit from continued inpatient follow up therapy, <3 hours/day    If plan is discharge home, recommend the following: A lot of help with bathing/dressing/bathroom;Assist for transportation;Help with stairs or ramp for entrance;Assistance with cooking/housework;A little help with walking and/or transfers   Can travel by private vehicle     Yes  Equipment Recommendations  Rolling walker (2 wheels)    Recommendations for Other Services       Precautions / Restrictions Precautions Precautions: Fall Precaution Comments: R toe amputations affecting balance; does better with shoes Restrictions Weight Bearing Restrictions Per Provider Order: No     Mobility  Bed Mobility               General bed mobility comments: Pt up in recliner    Transfers Overall transfer level: Needs assistance Equipment used: Rolling walker (2 wheels), None Transfers: Sit to/from Stand Sit to Stand: Min assist, Contact guard assist           General transfer comment: Initial min assist for balance on rising. Progressed to CGA with repeated sit to stands    Ambulation/Gait Ambulation/Gait assistance: Min  assist Gait Distance (Feet): 150 Feet Assistive device: Rolling walker (2 wheels) Gait Pattern/deviations: Step-through pattern, Decreased stride length, Drifts right/left, Decreased step length - left, Trunk flexed, Knee flexed in stance - right, Knee flexed in stance - left Gait velocity: decr Gait velocity interpretation: <1.8 ft/sec, indicate of risk for recurrent falls   General Gait Details: Assist for balance and to manage walker especially on turns. Pt with slight rt lean. Verbal cues to stay closer to walker   Stairs             Wheelchair Mobility     Tilt Bed    Modified Rankin (Stroke Patients Only)       Balance Overall balance assessment: Needs assistance Sitting-balance support: Feet supported, No upper extremity supported Sitting balance-Leahy Scale: Fair Sitting balance - Comments: UE support   Standing balance support: Bilateral upper extremity supported, During functional activity, Single extremity supported, Reliant on assistive device for balance, No upper extremity supported Standing balance-Leahy Scale: Poor Standing balance comment: Requires UE support and/or min assist for static standing             High level balance activites: Side stepping, Backward walking, Other (comment) (repeated forward/backward stepping) High Level Balance Comments: Min to mod assist and bilateral shelf arm support for balance activities            Cognition Arousal: Alert Behavior During Therapy: Impulsive Overall Cognitive Status: History of cognitive impairments - at baseline  General Comments: history of dementia; disoriented to time, place, and situation; decr safety awareness; decr awareness of deficits; poor problem solving        Exercises Other Exercises Other Exercises: Repeated sit to stand x 10    General Comments        Pertinent Vitals/Pain Pain Assessment Pain Assessment: No/denies  pain Faces Pain Scale: No hurt    Home Living                          Prior Function            PT Goals (current goals can now be found in the care plan section) Acute Rehab PT Goals Patient Stated Goal: to walk Progress towards PT goals: Progressing toward goals    Frequency    Min 1X/week      PT Plan      Co-evaluation              AM-PAC PT 6 Clicks Mobility   Outcome Measure  Help needed turning from your back to your side while in a flat bed without using bedrails?: A Little Help needed moving from lying on your back to sitting on the side of a flat bed without using bedrails?: A Little Help needed moving to and from a bed to a chair (including a wheelchair)?: A Little Help needed standing up from a chair using your arms (e.g., wheelchair or bedside chair)?: A Little Help needed to walk in hospital room?: A Little Help needed climbing 3-5 steps with a railing? : Total 6 Click Score: 16    End of Session Equipment Utilized During Treatment: Gait belt Activity Tolerance: Patient tolerated treatment well Patient left: with call bell/phone within reach;in chair;with chair alarm set Nurse Communication: Mobility status PT Visit Diagnosis: Unsteadiness on feet (R26.81);Other abnormalities of gait and mobility (R26.89);Muscle weakness (generalized) (M62.81);Difficulty in walking, not elsewhere classified (R26.2)     Time: 9075-9053 PT Time Calculation (min) (ACUTE ONLY): 22 min  Charges:    $Gait Training: 8-22 mins PT General Charges $$ ACUTE PT VISIT: 1 Visit                     [facility] PT Acute Rehabilitation Services Office [phone]    Rodgers ORN [facility] 05/02/2023, 9:47 AM

## 2023-05-02 NOTE — TOC Progression Note (Signed)
 Transition of Care [facility]) - Progression Note    Patient Details  Name: Walter [name] MRN: [id number] Date of Birth: [birth year]  [facility] Care [facility]) CM/SW Contact  Montie LOISE Louder, [place] Phone Number: 05/02/2023, 4:39 PM  Clinical Narrative:     [facility] Health Care confirmed availability- Insurance auth started reference # [id number]- insurance pending   Montie Louder, MSW, LCSW Clinical Social Worker     Barriers to Discharge: English As A Second Language Teacher  Expected Discharge Plan and Services In-house Referral: Clinical Social Work     Living arrangements for the past 2 months: Single Family Home                           HH Arranged:  (Active with [place]  for PT OT SLP)           Social Determinants of Health (SDOH) Interventions SDOH Screenings   Food Insecurity: No Food Insecurity (04/20/2023)  Housing: Low Risk  (04/20/2023)  Recent Concern: Housing - High Risk (03/31/2023)  Transportation Needs: No Transportation Needs (04/20/2023)  Utilities: Not At Risk (04/20/2023)  Depression (PHQ2-9): High Risk (04/11/2023)  Financial Resource Strain: Low Risk  (05/12/2022)  Physical Activity: Sufficiently Active (05/12/2022)  Social Connections: Unknown (05/02/2023)  Stress: No Stress Concern Present (05/12/2022)  Tobacco Use: Medium Risk (04/19/2023)    Readmission Risk Interventions     No data to display

## 2023-05-02 NOTE — Progress Notes (Signed)
 Mobility Specialist Progress Note:    05/02/23 1400  Mobility  Activity Ambulated with assistance in hallway  Level of Assistance Minimal assist, patient does 75% or more  Assistive Device Front wheel walker  Distance Ambulated (ft) 80 ft  Activity Response Tolerated well  Mobility Referral Yes  Mobility visit 1 Mobility  Mobility Specialist Start Time (ACUTE ONLY) 1428  Mobility Specialist Stop Time (ACUTE ONLY) 1442  Mobility Specialist Time Calculation (min) (ACUTE ONLY) 14 min   Pt received in bed, agreeable to mobility. Required minA to bed mobility and STS. Able to ambulate w/ CG. No complaints throughout. Pt left in bed with call bell and family present. Bed alarm on.  D'Vante Nicholaus Mobility Specialist Please contact via Special Educational Needs Teacher or Rehab office at [phone]

## 2023-05-02 NOTE — Patient Outreach (Signed)
  Care Coordination   Follow Up Visit Note   05/02/2023 Name: Walter [name] MRN: [id number] DOB: [birth year]  [name] is a 82 y.o. year old male who sees Hunter, Garnette KIDD, MD for primary care. I spoke with  Walter [name] spouse and adult daughter, Walter [name] by phone today.  What matters to the patients health and wellness today?  Symptom Management, Level of Care (SNF Placement)    Goals Addressed             This Visit's Progress    Obtain Supportive Resources-Caregiver Stress   On track    Activities and task to complete in order to accomplish goals.   Keep all upcoming appointments discussed today Continue with compliance of taking medication prescribed by Doctor Implement healthy coping skills discussed to assist with management of symptoms Discuss diagnosis of PMH Dementia with Attending Provider and PCP Follow up with Inpatient CSW regarding SNF placement and reasoning behind recent bed denials         SDOH assessments and interventions completed:  No     Care Coordination Interventions:  Yes, provided  Interventions Today    Flowsheet Row Most Recent Value  Chronic Disease   Chronic disease during today's visit Diabetes, Other  [Pulmonary Embolism, Depression, Anxiety]  General Interventions   General Interventions Discussed/Reviewed General Interventions Reviewed, Doctor Visits, Level of Care  Doctor Visits Discussed/Reviewed Doctor Visits Reviewed  Level of Care Skilled Nursing Facility  Exercise Interventions   Exercise Discussed/Reviewed Physical Activity  [Physical Therapist continues to assess pt for mobility to decrease fall risk]  Mental Health Interventions   Mental Health Discussed/Reviewed Mental Health Reviewed, Coping Strategies  [Validation and encouragement provided.Strategies to assist with stress management while pt is inpatient discussed. Encouraged self-care]  Safety Interventions   Safety Discussed/Reviewed Safety Reviewed       Follow up  plan: Follow up call scheduled for 1 week    Encounter Outcome:  Patient Visit Completed   Rolin Kerns, MSW, LCSW [facility] Care Management [facility]  [facility] [email or web address] Phone [phone] 1:38 PM

## 2023-05-02 NOTE — Progress Notes (Signed)
   05/02/23 0000  BiPAP/CPAP/SIPAP  Reason BIPAP/CPAP not in use Non-compliant (pt refused)

## 2023-05-02 NOTE — Patient Instructions (Signed)
 Visit Information  Thank you for taking time to visit with me today. Please don't hesitate to contact me if I can be of assistance to you.   Following are the goals we discussed today:   Goals Addressed             This Visit's Progress    Obtain Supportive Resources-Caregiver Stress   On track    Activities and task to complete in order to accomplish goals.   Keep all upcoming appointments discussed today Continue with compliance of taking medication prescribed by Doctor Implement healthy coping skills discussed to assist with management of symptoms Discuss diagnosis of PMH Dementia with Attending Provider and PCP Follow up with Inpatient CSW regarding SNF placement and reasoning behind recent bed denials         Our next appointment is by telephone on 1/7 at 1:30 PM  Please call the care guide team at [phone] if you need to cancel or reschedule your appointment.   If you are experiencing a Mental Health or Behavioral Health Crisis or need someone to talk to, please call the Suicide and Crisis Lifeline: 988 call 911   Patient verbalizes understanding of instructions and care plan provided today and agrees to view in MyChart. Active MyChart status and patient understanding of how to access instructions and care plan via MyChart confirmed with patient.     Brylee Berk, MSW, LCSW [facility] Care Management [place]  [facility] [email or web address] Phone [phone] 1:39 PM

## 2023-05-02 NOTE — TOC Transition Note (Addendum)
 Transition of Care [facility]) - Discharge Note   Patient Details  Name: Walter [name] MRN: [id number] Date of Birth: 21-Nov-1940  Transition of Care [facility]) CM/SW Contact:  Montie LOISE Louder, LCSW Phone Number: 05/02/2023, 1:04 PM   Clinical Narrative:     [facility] - declined bed offer  Family preferred SNF choice [facility] or [facility] - attempting to confirm placement today Contacted [facility]- waiting on response Contacted family updated on bed status   Montie Louder, MSW, LCSW Clinical Social Worker        Final next level of care: Skilled Nursing Facility Barriers to Discharge: pending SNF placement    Patient Goals and CMS Choice     Choice offered to / list presented to : Spouse      Discharge Placement              Patient chooses bed at:  [facility]) Patient to be transferred to facility by: [facility] Name of family member notified: daughter Patient and family notified of of transfer: 05/02/23  Discharge Plan and Services Additional resources added to the After Visit Summary for   In-house Referral: Clinical Social Work                        HH Arranged:  (Active with [place]  for PT OT SLP)          Social Drivers of Health (SDOH) Interventions SDOH Screenings   Food Insecurity: No Food Insecurity (04/20/2023)  Housing: Low Risk  (04/20/2023)  Recent Concern: Housing - High Risk (03/31/2023)  Transportation Needs: No Transportation Needs (04/20/2023)  Utilities: Not At Risk (04/20/2023)  Depression (PHQ2-9): High Risk (04/11/2023)  Financial Resource Strain: Low Risk  (05/12/2022)  Physical Activity: Sufficiently Active (05/12/2022)  Social Connections: Unknown (05/02/2023)  Stress: No Stress Concern Present (05/12/2022)  Tobacco Use: Medium Risk (04/19/2023)     Readmission Risk Interventions     No data to display

## 2023-05-03 DIAGNOSIS — I2699 Other pulmonary embolism without acute cor pulmonale: Secondary | ICD-10-CM | POA: Diagnosis not present

## 2023-05-03 LAB — GLUCOSE, CAPILLARY
Glucose-Capillary: 162 mg/dL — ABNORMAL HIGH (ref 70–99)
Glucose-Capillary: 209 mg/dL — ABNORMAL HIGH (ref 70–99)

## 2023-05-03 MED ORDER — APIXABAN 5 MG PO TABS: 5.0000 mg | ORAL_TABLET | Freq: Two times a day (BID) | ORAL | 0 refills | Status: DC

## 2023-05-03 NOTE — TOC Progression Note (Signed)
 Transition of Care [facility]) - Progression Note    Patient Details  Name: Walter [name] MRN: [id number] Date of Birth: 1941/01/10  Transition of Care [facility]) CM/SW Contact  Montie LOISE Louder, [place] Phone Number: 05/03/2023, 12:02 PM  Clinical Narrative:     Received insurance auth for [facility] [id number] reference # [id number] 12/31-01/03. Updated family- possible d/c today  Montie Louder, MSW, LCSW Clinical Social Worker       Barriers to Discharge: Conservator, Museum/gallery and Services In-house Referral: Clinical Social Work     Living arrangements for the past 2 months: Single Family Home                           HH Arranged:  (Active with [place]  for PT OT SLP)           Social Determinants of Health (SDOH) Interventions SDOH Screenings   Food Insecurity: No Food Insecurity (04/20/2023)  Housing: Low Risk  (04/20/2023)  Recent Concern: Housing - High Risk (03/31/2023)  Transportation Needs: No Transportation Needs (04/20/2023)  Utilities: Not At Risk (04/20/2023)  Depression (PHQ2-9): High Risk (04/11/2023)  Financial Resource Strain: Low Risk  (05/12/2022)  Physical Activity: Sufficiently Active (05/12/2022)  Social Connections: Unknown (05/02/2023)  Stress: No Stress Concern Present (05/12/2022)  Tobacco Use: Medium Risk (04/19/2023)    Readmission Risk Interventions     No data to display

## 2023-05-03 NOTE — TOC Transition Note (Signed)
 Transition of Care [facility]) - Discharge Note   Patient Details  Name: Walter [name] MRN: [id number] Date of Birth: May 19, 1940  Transition of Care [facility]) CM/SW Contact:  Montie LOISE Louder, LCSW Phone Number: 05/03/2023, 1:14 PM   Clinical Narrative:     Patient will Discharge to: [facility]  Discharge Date: 05/03/23 Family Notified: spouse  Transport Ab:[facility] -please call daughter once [facility] has arrived   Per MD patient is ready for discharge. RN, patient, and facility notified of discharge. Discharge Summary sent to facility. RN given number for [phone]. Ambulance transport requested for patient.   Clinical Social Worker signing off.  Montie Louder, MSW, LCSW Clinical Social Worker     Final next level of care: Skilled Nursing Facility Barriers to Discharge: Barriers Resolved   Patient Goals and CMS Choice     Choice offered to / list presented to : Spouse      Discharge Placement              Patient chooses bed at:  [facility]) Patient to be transferred to facility by: [facility] Name of family member notified: daughter Patient and family notified of of transfer: 05/03/23  Discharge Plan and Services Additional resources added to the After Visit Summary for   In-house Referral: Clinical Social Work                        HH Arranged:  (Active with [facility]  for PT OT SLP)          Social Drivers of Health (SDOH) Interventions SDOH Screenings   Food Insecurity: No Food Insecurity (04/20/2023)  Housing: Low Risk  (04/20/2023)  Recent Concern: Housing - High Risk (03/31/2023)  Transportation Needs: No Transportation Needs (04/20/2023)  Utilities: Not At Risk (04/20/2023)  Depression (PHQ2-9): High Risk (04/11/2023)  Financial Resource Strain: Low Risk  (05/12/2022)  Physical Activity: Sufficiently Active (05/12/2022)  Social Connections: Unknown (05/02/2023)  Stress: No Stress Concern Present (05/12/2022)  Tobacco Use: Medium  Risk (04/19/2023)     Readmission Risk Interventions     No data to display

## 2023-05-03 NOTE — Progress Notes (Signed)
 RN spoke to [place] at [facility] for report

## 2023-05-03 NOTE — Progress Notes (Signed)
 PROGRESS NOTE    Walter [name]  FMW:[id number] DOB: 03-27-1941 DOA: 04/19/2023 PCP: Katrinka Garnette KIDD, MD  Chief Complaint  Patient presents with   Altered Mental Status    Brief Narrative:   Mr. Venning is an 83 yo male with PMH chronic dementia, HTN, HLD, DM II, OSA on CPAP, BPH, PTSD/anxiety/depression who presented with altered mentation.   Note, patient recently admitted from 11/29 until 12/1.  Admitted with altered mental status in the setting of sepsis secondary to aspiration pneumonia at the bilateral bases.  AMS also believed to have a delirium component in the setting of chronic dementia.  Was treated with antibiotics with improvement.  On discharge patient was alert and oriented to self only.  Plan for outpatient neurology follow-up.   Patient did follow-up with neurology who recommended continue donepezil  and continued Lexapro/Seroquel  for PTSD/anxiety/depression.  Further neuropsychiatric testing to be repeated in 2025.   Patient has not returned to previous baseline prior to that admission and is unclear if he will.  However for the past 2 to 3 days prior to admission, he has had significant worsening initially with increasing weakness and now significantly increased confusion with some associated agitation. His confusion appeared similar to how it was just prior to his recent admission.   He underwent workup with imaging studies in the ER.  CT was notable for acute PE involving RUL and RLL; there was also groundglass opacities in the right lower lobe concerning for infection. He was started on heparin  drip, oxygen, and antibiotics and admitted for further monitoring.  Assessment & Plan:   Principal Problem:   Acute pulmonary embolism (HCC) Active Problems:   CAP (community acquired pneumonia)   Cognitive impairment   History of GI bleed   Pulmonary nodule   Diabetes mellitus type II, controlled   Hyperlipidemia associated with type 2 diabetes mellitus   Obstructive sleep  apnea   Essential hypertension   BPH (benign prostatic hyperplasia)   Pancreatic cyst   Acute encephalopathy   Anxiety and depression   Altered mental status   Arterial hypotension  Hypotension 12/30 - resolved Hold amlodipine  and arb  Acute PE: Involving segmental RUL and RLL. Normal RV by echo, negative BL LE venous U/S.  - Continue eliquis  5mg  po BID (having completed load)   Deconditioning: Requires rehabilitation. CIR was pursued but denied after peer-to-peer. CSW has sent referrals to multiple SNFs.  - Patient is medically stable for discharge once SNF bed is available.    RLL CAP: Vs. infarct on initial CT. This was treated x5 days CTX, azithromycin .    Dementia:  - Delirium precautions, continue aricept    History of GI bleed: No current gross bleeding on new anticoagulation. - Ferrous sulfate  325 mg PO every other day - Protonix  40 mg PO daily     Pulmonary nodule: 12 mm left lower lobe pulmonary nodule, similar to prior. This was evaluated by PET-CT 10/28/2022 and found to be non hypermetabolic.    Pancreatic cyst - 2 year follow up    HTN: as above, hypotension - antihypertensives on hold   HLD:  - Continue rosuvastatin  20mg     T2DM: At goal for age with HbA1c 7.1%.  - Continue metformin  with no further contrast planned, continue linagliptin  and mod SSI     DVT prophylaxis: eliquis  Code Status: DNR Family Communication: none Disposition:   Status is: Inpatient Remains inpatient appropriate because: awaiting placement   Consultants:  none  Procedures:  LE US  Summary:  BILATERAL:  -  No evidence of deep vein thrombosis seen in the lower extremities,  bilaterally.  -No evidence of popliteal cyst, bilaterally.   Echo IMPRESSIONS     1. Left ventricular ejection fraction, by estimation, is 60 to 65%. The  left ventricle has normal function. The left ventricle has no regional  wall motion abnormalities. There is mild left ventricular hypertrophy.   Left ventricular diastolic parameters  are consistent with Grade I diastolic dysfunction (impaired relaxation).   2. Right ventricular systolic function is normal. The right ventricular  size is normal.   3. The mitral valve was not well visualized. No evidence of mitral valve  regurgitation.   4. The aortic valve was not well visualized. Aortic valve regurgitation  is not visualized. No aortic stenosis is present.   Conclusion(s)/Recommendation(s): Windows are challenging, study appears  negative for RV strain.   Antimicrobials:  Anti-infectives (From admission, onward)    Start     Dose/Rate Route Frequency Ordered Stop   04/21/23 1600  azithromycin  (ZITHROMAX ) tablet 500 mg        500 mg Oral Daily 04/21/23 0950 04/23/23 0947   04/20/23 1000  azithromycin  (ZITHROMAX ) 500 mg in sodium chloride  0.9 % 250 mL IVPB  Status:  Discontinued        500 mg 250 mL/hr over 60 Minutes Intravenous Every 24 hours 04/19/23 1656 04/19/23 1816   04/20/23 0000  cefTRIAXone  (ROCEPHIN ) 2 g in sodium chloride  0.9 % 100 mL IVPB        2 g 200 mL/hr over 30 Minutes Intravenous Every 24 hours 04/19/23 1655 04/24/23 0700   04/19/23 2200  azithromycin  (ZITHROMAX ) 500 mg in sodium chloride  0.9 % 250 mL IVPB  Status:  Discontinued        500 mg 250 mL/hr over 60 Minutes Intravenous Every 24 hours 04/19/23 1816 04/21/23 0950   04/19/23 1700  vancomycin  (VANCOREADY) IVPB 750 mg/150 mL       Placed in Followed by Linked Group   750 mg 150 mL/hr over 60 Minutes Intravenous  Once 04/19/23 1533 04/19/23 2200   04/19/23 1700  azithromycin  (ZITHROMAX ) 500 mg in sodium chloride  0.9 % 250 mL IVPB  Status:  Discontinued        500 mg 250 mL/hr over 60 Minutes Intravenous Every 24 hours 04/19/23 1655 04/19/23 1656   04/19/23 1545  vancomycin  (VANCOCIN ) IVPB 1000 mg/200 mL premix       Placed in Followed by Linked Group   1,000 mg 200 mL/hr over 60 Minutes Intravenous  Once 04/19/23 1533 04/19/23 2035    04/19/23 1545  piperacillin -tazobactam (ZOSYN ) IVPB 3.375 g        3.375 g 100 mL/hr over 30 Minutes Intravenous  Once 04/19/23 1533 04/19/23 1752       Subjective: Patient seen and examined.  No complaints.  Objective: Vitals:   05/02/23 1635 05/02/23 1953 05/03/23 0301 05/03/23 0809  BP: 99/63 109/61 115/68 115/75  Pulse:  88 85 92  Resp:  18 16 17   Temp:  98.2 F (36.8 C) 97.9 F (36.6 C)   TempSrc:  Oral Oral   SpO2:  93% 94%   Weight:   74.8 kg   Height:        Intake/Output Summary (Last 24 hours) at 05/03/2023 0930 Last data filed at 05/03/2023 0302 Gross per 24 hour  Intake 480 ml  Output 1000 ml  Net -520 ml   Filed Weights   04/30/23 0448 05/01/23 0442 05/03/23 0301  Weight:  74.5 kg 75.7 kg 74.8 kg    Examination:  General exam: Appears calm and comfortable  Respiratory system: Clear to auscultation. Respiratory effort normal. Cardiovascular system: S1 & S2 heard, RRR. No JVD, murmurs, rubs, gallops or clicks. No pedal edema. Gastrointestinal system: Abdomen is nondistended, soft and nontender. No organomegaly or masses felt. Normal bowel sounds heard. Central nervous system: Alert and oriented. No focal neurological deficits. Extremities: Symmetric 5 x 5 power. Skin: No rashes, lesions or ulcers.  Psychiatry: Judgement and insight appear normal. Mood & affect appropriate.   Data Reviewed: I have personally reviewed following labs and imaging studies  CBC: Recent Labs  Lab 05/01/23 0355 05/02/23 0451  WBC 10.8* 10.8*  NEUTROABS 7.8* 8.5*  HGB 14.2 13.6  HCT 41.4 38.9*  MCV 89.4 89.0  PLT 297 289    Basic Metabolic Panel: Recent Labs  Lab 05/01/23 0355 05/02/23 0451  NA 136 135  K 4.6 4.4  CL 99 99  CO2 25 27  GLUCOSE 149* 148*  BUN 22 31*  CREATININE 1.01 1.09  CALCIUM  9.5 9.4  MG 1.8 1.8  PHOS 4.0 3.6    GFR: Estimated Creatinine Clearance: 55.3 mL/min (by C-G formula based on SCr of 1.09 mg/dL).  Liver Function Tests: Recent  Labs  Lab 05/01/23 0355 05/02/23 0451  AST 18 14*  ALT 22 22  ALKPHOS 74 64  BILITOT 0.8 0.8  PROT 6.5 6.6  ALBUMIN 3.2* 3.2*    CBG: Recent Labs  Lab 05/02/23 0759 05/02/23 1148 05/02/23 1616 05/02/23 2118 05/03/23 0731  GLUCAP 231* 156* 170* 110* 162*     No results found for this or any previous visit (from the past 240 hours).       Radiology Studies: No results found.      Scheduled Meds:  apixaban   5 mg Oral BID   donepezil   10 mg Oral QHS   feeding supplement  237 mL Oral BID BM   ferrous sulfate   325 mg Oral QODAY   insulin  aspart  0-15 Units Subcutaneous TID WC   insulin  aspart  0-5 Units Subcutaneous QHS   linagliptin   5 mg Oral Daily   metFORMIN   1,000 mg Oral BID WC   pantoprazole   40 mg Oral Daily   polyethylene glycol  17 g Oral Daily   rosuvastatin   20 mg Oral Daily   Continuous Infusions:     LOS: 12 days    Time spent: over 30 min    Fredia Skeeter, MD [facility]   To contact the attending provider between 7A-7P or the covering provider during after hours 7P-7A, please log into the web site [email or web address] and access using universal [place] password for that web site. If you do not have the password, please call the hospital operator.  05/03/2023, 9:30 AM

## 2023-05-03 NOTE — Discharge Summary (Signed)
 Physician Discharge Summary  Walter [name] FMW:[id number] DOB: [birth year] DOA: 04/19/2023  PCP: Katrinka Garnette KIDD, MD  Admit date: 04/19/2023 Discharge date: 05/03/2023 30 Day Unplanned Readmission Risk Score    Flowsheet Row ED to Hosp-Admission (Current) from 04/19/2023 in [facility]  30 Day Unplanned Readmission Risk Score (%) 19.94 Filed at 05/03/2023 1200       This score is the patient's risk of an unplanned readmission within 30 days of being discharged (0 -100%). The score is based on dignosis, age, lab data, medications, orders, and past utilization.   Low:  0-14.9   Medium: 15-21.9   High: 22-29.9   Extreme: 30 and above          Admitted From: Home Disposition: SNF  Recommendations for Outpatient Follow-up:  Follow up with PCP in 1-2 weeks Please obtain BMP/CBC in one week Please follow up with your PCP on the following pending results: Unresulted Labs (From admission, onward)    None         Home Health: None Equipment/Devices: None  Discharge Condition: Stable CODE STATUS: DNR Diet recommendation: Cardiac  Subjective: Seen and examined.  He has no complaints.  Brief/Interim Summary: Walter [name] is an 83 yo male with PMH chronic dementia, HTN, HLD, DM II, OSA on CPAP, BPH, PTSD/anxiety/depression who presented with altered mentation.   Note, patient recently admitted from 11/29 until 12/1.  Admitted with altered mental status in the setting of sepsis secondary to aspiration pneumonia at the bilateral bases.  AMS also believed to have a delirium component in the setting of chronic dementia.  Was treated with antibiotics with improvement.  On discharge patient was alert and oriented to self only.  Plan for outpatient neurology follow-up.   Patient did follow-up with neurology who recommended continue donepezil  and continued Lexapro/Seroquel  for PTSD/anxiety/depression.  Further neuropsychiatric testing to be repeated in 2025.   Patient has not  returned to previous baseline prior to that admission and is unclear if he will.  However for the past 2 to 3 days prior to admission, he has had significant worsening initially with increasing weakness and now significantly increased confusion with some associated agitation. His confusion appeared similar to how it was just prior to his recent admission.   He underwent workup with imaging studies in the ER.  CT was notable for acute PE involving RUL and RLL; there was also groundglass opacities in the right lower lobe concerning for infection. He was started on heparin  drip, oxygen, and antibiotics and admitted for further monitoring.  Details below.   Presented with hypotension however has a previous history of hypertension. Patient's antihypertensives were held and yet his blood pressure is only low normal so based on that extended data during this hospitalization, we have decided to discontinue his antihypertensives.  Continue to monitor blood pressure and reconsider resuming those as appropriate.   Acute PE: Involving segmental RUL and RLL. Normal RV by echo, negative BL LE venous U/S.  - Continue eliquis  5mg  po BID (have completed load)   Deconditioning: Requires rehabilitation. CIR was pursued but denied after peer-to-peer.  Patient has been approved for SNF and is being discharged today.   RLL CAP: Vs. infarct on initial CT. This was treated x5 days CTX, azithromycin .    Dementia:  - Delirium precautions, continue aricept    History of GI bleed: No current gross bleeding on new anticoagulation. - Ferrous sulfate  325 mg PO every other day - Protonix  40 mg PO daily while  here but no need for PPI as outpatient as that may cause other side effects such as C. difficile.   Pulmonary nodule: 12 mm left lower lobe pulmonary nodule, similar to prior. This was evaluated by PET-CT 10/28/2022 and found to be non hypermetabolic.    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  - Continue metformin  with no further contrast planned, continue linagliptin  and mod SSI   Discharge plan was discussed with patient and/or family member and they verbalized understanding and agreed with it.  Discharge Diagnoses:  Principal Problem:   Acute pulmonary embolism (HCC) Active Problems:   CAP (community acquired pneumonia)   Cognitive impairment   History of GI bleed   Pulmonary nodule   Diabetes mellitus type II, controlled   Hyperlipidemia associated with type 2 diabetes mellitus   Obstructive sleep apnea   Essential hypertension   BPH (benign prostatic hyperplasia)   Pancreatic cyst   Acute encephalopathy   Anxiety and depression   Altered mental status   Arterial hypotension    Discharge Instructions   Allergies as of 05/03/2023       Reactions   Bactrim  [sulfamethoxazole -trimethoprim ] Nausea And Vomiting   Pt has chills and fevers also.        Medication List     STOP taking these medications    amLODipine  5 MG tablet Commonly known as: NORVASC    QUEtiapine  25 MG tablet Commonly known as: SEROQUEL    telmisartan  80 MG tablet Commonly known as: MICARDIS        TAKE these medications    apixaban  5 MG Tabs tablet Commonly known as: ELIQUIS  Take 1 tablet (5 mg total) by mouth 2 (two) times daily.   [employer] Stomach Relief 262 MG chewable tablet Generic drug: bismuth  subsalicylate Chew 2 tablets by mouth 4 (four) times daily.   donepezil  10 MG tablet Commonly known as: ARICEPT  TAKE 1 TABLET BY MOUTH EVERYDAY AT BEDTIME What changed: See the new instructions.   ferrous sulfate  325 (65 FE) MG tablet Take 1 tablet (325 mg total) by mouth 2 (two) times daily with a meal.   glipiZIDE  10 MG tablet Commonly known as: GLUCOTROL  TAKE 1 TABLET (10 MG TOTAL) BY MOUTH TWICE A DAY BEFORE A MEAL   Gvoke HypoPen  2-Pack 0.5 MG/0.1ML Soaj Generic drug: Glucagon  Inject 0.5 mg into the skin  daily as needed (for low blood sugar).   metFORMIN  1000 MG tablet Commonly known as: GLUCOPHAGE  TAKE 1 TABLET BY MOUTH TWICE A DAY WITH A MEAL   OneTouch Ultra test strip Generic drug: glucose blood USE ONCE DAILY AS INSTRUCTED   rosuvastatin  20 MG tablet Commonly known as: CRESTOR  TAKE 1 TABLET BY MOUTH ONE TIME PER WEEK   sitaGLIPtin  100 MG tablet Commonly known as: Januvia  Take 1 tablet (100 mg total) by mouth daily.        Follow-up Information     Katrinka Garnette KIDD, MD Follow up in 1 week(s).   Specialty: Family Medicine Contact information: [address] [place] [address] [phone]                Allergies  Allergen Reactions   Bactrim  [Sulfamethoxazole -Trimethoprim ] Nausea And Vomiting    Pt has chills and fevers also.    Consultations: None   Procedures/Studies: CT HEAD WO CONTRAST ( ) Result Date: 04/26/2023 CLINICAL DATA:  Head trauma, moderate to severe. Patient  is on anticoagulation. EXAM: CT HEAD WITHOUT CONTRAST TECHNIQUE: Contiguous axial images were obtained from the base of the skull through the vertex without intravenous contrast. RADIATION DOSE REDUCTION: This exam was performed according to the departmental dose-optimization program which includes automated exposure control, adjustment of the mA and/or kV according to patient size and/or use of iterative reconstruction technique. COMPARISON:  CT head without contrast 04/19/2023 FINDINGS: Brain: No acute infarct, hemorrhage, or mass lesion is present. Atrophy and white matter changes are stable. Deep brain nuclei are within normal limits. The ventricles are proportionate to the degree of atrophy. No significant extraaxial fluid collection is present. The brainstem and cerebellum are within normal limits. Vascular: Atherosclerotic calcifications are present within the cavernous internal carotid arteries bilaterally no hyperdense vessel is present. Skull: Calvarium is intact. No  focal lytic or blastic lesions are present. No significant extracranial soft tissue lesion is present. Sinuses/Orbits: The paranasal sinuses and mastoid air cells are clear. The globes and orbits are within normal limits. IMPRESSION: 1. No acute intracranial abnormality or significant interval change. 2. Stable atrophy and white matter disease. This likely reflects the sequela of chronic microvascular ischemia. Electronically Signed   By: Lonni Necessary M.D.   On: 04/26/2023 11:57   VAS US  LOWER EXTREMITY VENOUS (DVT) Result Date: 04/20/2023  Lower Venous DVT Study Patient Name:  LEANORD THIBEAU  Date of Exam:   04/20/2023 Medical Rec #: [id number]  Accession #:    [id number] Date of Birth: [birth year]  Patient Gender: M Patient Age:   59 years Exam Location:  [facility] Procedure:      VAS US  LOWER EXTREMITY VENOUS (DVT) Referring Phys: ALM APO --------------------------------------------------------------------------------  Indications: Pulmonary embolism.  Comparison Study: No priors. Performing Technologist: Ricka Holland RDMS, RVT  Examination Guidelines: A complete evaluation includes B-mode imaging, spectral Doppler, color Doppler, and power Doppler as needed of all accessible portions of each vessel. Bilateral testing is considered an integral part of a complete examination. Limited examinations for reoccurring indications may be performed as noted. The reflux portion of the exam is performed with the patient in reverse Trendelenburg.  +---------+---------------+---------+-----------+----------+--------------+ RIGHT    CompressibilityPhasicitySpontaneityPropertiesThrombus Aging +---------+---------------+---------+-----------+----------+--------------+ CFV      Full           Yes      Yes                                 +---------+---------------+---------+-----------+----------+--------------+ SFJ      Full                                                         +---------+---------------+---------+-----------+----------+--------------+ FV Prox  Full                                                        +---------+---------------+---------+-----------+----------+--------------+ FV Mid   Full                                                        +---------+---------------+---------+-----------+----------+--------------+  FV DistalFull                                                        +---------+---------------+---------+-----------+----------+--------------+ PFV      Full                                                        +---------+---------------+---------+-----------+----------+--------------+ POP      Full           Yes      Yes                                 +---------+---------------+---------+-----------+----------+--------------+ PTV      Full                                                        +---------+---------------+---------+-----------+----------+--------------+ PERO     Full                                                        +---------+---------------+---------+-----------+----------+--------------+   +---------+---------------+---------+-----------+----------+--------------+ LEFT     CompressibilityPhasicitySpontaneityPropertiesThrombus Aging +---------+---------------+---------+-----------+----------+--------------+ CFV      Full           Yes      Yes                                 +---------+---------------+---------+-----------+----------+--------------+ SFJ      Full                                                        +---------+---------------+---------+-----------+----------+--------------+ FV Prox  Full                                                        +---------+---------------+---------+-----------+----------+--------------+ FV Mid   Full                                                         +---------+---------------+---------+-----------+----------+--------------+ FV DistalFull                                                        +---------+---------------+---------+-----------+----------+--------------+  PFV      Full                                                        +---------+---------------+---------+-----------+----------+--------------+ POP      Full           Yes      Yes                                 +---------+---------------+---------+-----------+----------+--------------+ PTV      Full                                                        +---------+---------------+---------+-----------+----------+--------------+ PERO     Full                                                        +---------+---------------+---------+-----------+----------+--------------+     Summary: BILATERAL: - No evidence of deep vein thrombosis seen in the lower extremities, bilaterally. -No evidence of popliteal cyst, bilaterally.   *See table(s) above for measurements and observations. Electronically signed by Norman Serve on 04/20/2023 at 3:46:39 PM.    Final    ECHOCARDIOGRAM COMPLETE Result Date: 04/20/2023    ECHOCARDIOGRAM REPORT   Patient Name:   ZELIG GACEK  Date of Exam: 04/20/2023 Medical Rec #:  [id number]  Height:       72.0 in Accession #:    [id number] Weight:       178.6 lb Date of Birth:  1940/10/22  BSA:          2.030 m Patient Age:    82 years   BP:           121/62 mmHg Patient Gender: M          HR:           72 bpm. Exam Location:  Inpatient Procedure: 2D Echo, Cardiac Doppler and Color Doppler Indications:    Pulmonary Embolus I26.09  History:        Patient has no prior history of Echocardiogram examinations.                 Risk Factors:Diabetes and Hypertension.  Sonographer:    Jayson Gaskins Referring Phys: [id number] MARSA NOVAK MELVIN  Sonographer Comments: Technically difficult study due to poor echo windows. Image acquisition challenging due to  respiratory motion. IMPRESSIONS  1. Left ventricular ejection fraction, by estimation, is 60 to 65%. The left ventricle has normal function. The left ventricle has no regional wall motion abnormalities. There is mild left ventricular hypertrophy. Left ventricular diastolic parameters are consistent with Grade I diastolic dysfunction (impaired relaxation).  2. Right ventricular systolic function is normal. The right ventricular size is normal.  3. The mitral valve was not well visualized. No evidence of mitral valve regurgitation.  4. The aortic valve was not well visualized. Aortic valve regurgitation is not visualized. No aortic stenosis is present. Conclusion(s)/Recommendation(s): Windows  are challenging, study appears negative for RV strain. FINDINGS  Left Ventricle: Left ventricular ejection fraction, by estimation, is 60 to 65%. The left ventricle has normal function. The left ventricle has no regional wall motion abnormalities. The left ventricular internal cavity size was normal in size. There is  mild left ventricular hypertrophy. Left ventricular diastolic parameters are consistent with Grade I diastolic dysfunction (impaired relaxation). Right Ventricle: The right ventricular size is normal. Right ventricular systolic function is normal. Left Atrium: Left atrial size was normal in size. Right Atrium: Right atrial size was normal in size. Pericardium: There is no evidence of pericardial effusion. Mitral Valve: The mitral valve was not well visualized. No evidence of mitral valve regurgitation. Tricuspid Valve: Tricuspid valve regurgitation is not demonstrated. Aortic Valve: The aortic valve was not well visualized. Aortic valve regurgitation is not visualized. No aortic stenosis is present. Aortic valve mean gradient measures 6.0 mmHg. Aortic valve peak gradient measures 9.5 mmHg. Aortic valve area, by VTI measures 2.38 cm. Pulmonic Valve: Pulmonic valve regurgitation is not visualized. Aorta: The aortic  root is normal in size and structure. IAS/Shunts: The interatrial septum was not well visualized.  LEFT VENTRICLE PLAX 2D LVIDd:         4.60 cm   Diastology LVIDs:         2.50 cm   LV e' medial:    6.96 cm/s LV PW:         1.10 cm   LV E/e' medial:  12.1 LV IVS:        1.00 cm   LV e' lateral:   7.18 cm/s LVOT diam:     2.00 cm   LV E/e' lateral: 11.8 LV SV:         73 LV SV Index:   36 LVOT Area:     3.14 cm  RIGHT VENTRICLE RV S prime:     13.70 cm/s LEFT ATRIUM           Index LA Vol (A4C): 57.3 ml 28.22 ml/m  AORTIC VALVE AV Area (Vmax):    2.63 cm AV Area (Vmean):   2.32 cm AV Area (VTI):     2.38 cm AV Vmax:           154.00 cm/s AV Vmean:          111.000 cm/s AV VTI:            0.307 m AV Peak Grad:      9.5 mmHg AV Mean Grad:      6.0 mmHg LVOT Vmax:         129.00 cm/s LVOT Vmean:        81.900 cm/s LVOT VTI:          0.233 m LVOT/AV VTI ratio: 0.76  AORTA Ao Root diam: 3.30 cm MITRAL VALVE MV Area (PHT): 2.39 cm     SHUNTS MV Decel Time: 317 msec     Systemic VTI:  0.23 m MV E velocity: 84.40 cm/s   Systemic Diam: 2.00 cm MV A velocity: 119.00 cm/s MV E/A ratio:  0.71 [place] signed by Ronal Ross Signature Date/Time: 04/20/2023/9:45:14 AM    Final    CT CHEST ABDOMEN PELVIS W CONTRAST Result Date: 04/19/2023 CLINICAL DATA:  Sepsis. EXAM: CT CHEST, ABDOMEN, AND PELVIS WITH CONTRAST TECHNIQUE: Multidetector CT imaging of the chest, abdomen and pelvis was performed following the standard protocol during bolus administration of intravenous contrast. RADIATION DOSE REDUCTION: This exam was performed  according to the departmental dose-optimization program which includes automated exposure control, adjustment of the mA and/or kV according to patient size and/or use of iterative reconstruction technique. CONTRAST:  75mL OMNIPAQUE  IOHEXOL  350 MG/ML SOLN COMPARISON:  Chest CT 03/31/2023.  PET-CT 10/20/2022. FINDINGS: CT CHEST FINDINGS Cardiovascular: The heart size is normal. No  substantial pericardial effusion. Coronary artery calcification is evident. Mild atherosclerotic calcification is noted in the wall of the thoracic aorta. Ascending thoracic aorta measures 4 cm diameter. Although pulmonary arteries are poorly opacified, filling defects in segmental right upper lobe and segmental/subsegmental right lower lobe pulmonary arteries are compatible with acute pulmonary embolus. Mediastinum/Nodes: No mediastinal lymphadenopathy. There is no hilar lymphadenopathy. The esophagus has normal imaging features. There is no axillary lymphadenopathy. Lungs/Pleura: Ground-glass and consolidative airspace disease in the posterior right lower lobe may be infectious/inflammatory although pulmonary infarct could have this appearance. 5 mm right lower lobe nodule on image 97/5 is new in the interval. There is some consolidative airspace disease in the posterior left lung base including a 12 mm left lower lobe nodule on image 106/5, similar to prior. No pleural effusion. Musculoskeletal: No worrisome lytic or sclerotic osseous abnormality. CT ABDOMEN PELVIS FINDINGS Hepatobiliary: No suspicious focal abnormality within the liver parenchyma. Tiny hypervascular focus in the subcapsular right liver on 53/3 is too small to characterize but is statistically most likely benign and may be a tiny flash filling hemangioma or vascular malformation. No followup imaging is recommended. There is no evidence for gallstones, gallbladder wall thickening, or pericholecystic fluid. No intrahepatic or extrahepatic biliary dilation. Pancreas: 4.2 x 2.7 cm cystic lesion in the head of the pancreas is similar to prior. Adjacent smaller cystic foci again noted. No main duct dilatation. Spleen: No splenomegaly. No suspicious focal mass lesion. Adrenals/Urinary Tract: No adrenal nodule or mass. Left kidney unremarkable. Tiny hypodensities in the left kidney are too small to characterize but are likely benign cyst. No followup  imaging is recommended. No evidence for hydroureter. The urinary bladder appears normal for the degree of distention. Stomach/Bowel: Stomach is unremarkable. No gastric wall thickening. No evidence of outlet obstruction. Duodenum is normally positioned as is the ligament of Treitz. No small bowel wall thickening. No small bowel dilatation. Nonvisualization of the appendix is consistent with the reported history of appendectomy. No gross colonic mass. No colonic wall thickening. Rectal anastomosis evident. Vascular/Lymphatic: There is moderate atherosclerotic calcification of the abdominal aorta without aneurysm. There is no gastrohepatic or hepatoduodenal ligament lymphadenopathy. No retroperitoneal or mesenteric lymphadenopathy. No pelvic sidewall lymphadenopathy. Reproductive: Low-density in the central prostate gland suggests TURP defect. Other: No intraperitoneal free fluid. Musculoskeletal: No worrisome lytic or sclerotic osseous abnormality. IMPRESSION: 1. Acute pulmonary embolus in segmental right upper lobe and segmental/subsegmental right lower lobe pulmonary arteries. 2. Ground-glass and consolidative airspace disease in the posterior right lower lobe may be infectious/inflammatory although pulmonary infarct could have this appearance. 3. 5 mm right lower lobe pulmonary nodule is new in the interval. No follow-up needed if patient is low-risk.This recommendation follows the consensus statement: Guidelines for Management of Incidental Pulmonary Nodules Detected on CT Images: From the Fleischner Society 2017; Radiology 2017; 284:228-243. 4. 12 mm left lower lobe pulmonary nodule, similar to prior. This was evaluated by PET-CT 10/28/2022 and found to be non hypermetabolic. 5. 4.2 x 2.7 cm cystic lesion in the head of the pancreas is similar to prior. This lesion was also not hypermetabolic on 10/28/2022 PET imaging and was characterized as stable compared back to 01/26/2022. PET-CT  recommendations indicated 2  year follow-up from that exam. Please see that report for full details. 6.  Aortic Atherosclerosis (ICD10-I70.0). Critical Value/emergent results were called by telephone at the time of interpretation on 04/19/2023 at 4:03 pm to provider Dr. Cleotis, who verbally acknowledged these results. Electronically Signed   By: Camellia Candle M.D.   On: 04/19/2023 16:03   CT L-SPINE NO CHARGE Result Date: 04/19/2023 CLINICAL DATA:  Sepsis. EXAM: CT LUMBAR SPINE WITH CONTRAST TECHNIQUE: Technique: Multiplanar CT images of the lumbar spine were reconstructed from contemporary CT of the Abdomen and Pelvis. RADIATION DOSE REDUCTION: This exam was performed according to the departmental dose-optimization program which includes automated exposure control, adjustment of the mA and/or kV according to patient size and/or use of iterative reconstruction technique. CONTRAST:  No additional. COMPARISON:  CTA abdomen and pelvis 01/26/2022 FINDINGS: Segmentation: 5 lumbar type vertebrae. Alignment: Mild lumbar dextroscoliosis. Straightening of the normal lumbar lordosis. No significant listhesis. Vertebrae: No acute fracture or suspicious osseous lesion. No interval bone destruction suggestive of discitis/osteomyelitis. Paraspinal and other soft tissues: No acute abnormality in the paraspinal soft tissues. Remainder of the abdomen and pelvis reported separately. 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 Mild-to-moderate neural foraminal stenosis on the left at L3-4 and on the right at L5-S1. IMPRESSION: 1. No acute osseous abnormality. 2. 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 3. Mild-to-moderate multilevel neural foraminal stenosis. Electronically Signed   By: Dasie Hamburg M.D.   On: 04/19/2023 15:03   CT Head Wo Contrast Result Date: 04/19/2023 CLINICAL DATA:  Mental status change, unknown cause. EXAM:  CT HEAD WITHOUT CONTRAST TECHNIQUE: Contiguous axial images were obtained from the base of the skull through the vertex without intravenous contrast. RADIATION DOSE REDUCTION: This exam was performed according to the departmental dose-optimization program which includes automated exposure control, adjustment of the mA and/or kV according to patient size and/or use of iterative reconstruction technique. COMPARISON:  Head CT 04/01/2023 and MRI 10/30/2022 FINDINGS: Brain: There is no evidence of an acute infarct, intracranial hemorrhage, mass, midline shift, or extra-axial fluid collection. Cerebral white matter hypodensities are unchanged and nonspecific but compatible with mild chronic small vessel ischemic disease. There is moderate cerebral atrophy. Vascular: Calcified atherosclerosis at the skull base. No hyperdense vessel. Skull: No acute fracture or suspicious osseous lesion. Sinuses/Orbits: Minimal mucosal thickening in the paranasal sinuses. Clear mastoid air cells. Unremarkable orbits. Other: None. IMPRESSION: 1. No evidence of acute intracranial abnormality. 2. 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 Electronically Signed   By: Dasie Hamburg M.D.   On: 04/19/2023 14:52   DG Chest Portable 1 View Result Date: 04/19/2023 CLINICAL DATA:  Fever.  Altered mental status. EXAM: PORTABLE CHEST 1 VIEW COMPARISON:  Chest radiograph dated March 31, 2023. FINDINGS: The heart size and mediastinal contours are within normal limits. Aortic atherosclerosis. Mild left basilar atelectasis/scarring. No focal consolidation, sizeable pleural effusion, or pneumothorax. No acute osseous abnormality. IMPRESSION: Mild left basilar atelectasis or scarring. Otherwise, no acute cardiopulmonary findings. Electronically Signed   By: Harrietta Sherry M.D.   On: 04/19/2023 13:34   US  THYROID  Result Date: 04/12/2023 CLINICAL DATA:  Thyroid  nodule seen on CT EXAM: THYROID  ULTRASOUND TECHNIQUE:  Ultrasound examination of the thyroid  gland and adjacent soft tissues was performed. COMPARISON:  CT chest 03/31/2023 FINDINGS: Parenchymal Echotexture: Mildly heterogenous Isthmus: 0.4 cm Right lobe: 4.9  x 1.8 x 2.1 cm Left lobe: 4.2 x 1.0 x 1.2 cm _________________________________________________________ Estimated total number of nodules >/= 1 cm: 3 Number of spongiform nodules >/=  2 cm not described below (TR1): 0 Number of mixed cystic and solid nodules >/= 1.5 cm not described below (TR2): 0 _________________________________________________________ Nodule 1: 1.5 x 1.4 x 1.3 cm cystic nodule in the mid right thyroid  lobe (TI-RADS 1) does not meet criteria for imaging surveillance or FNA. _________________________________________________________ Nodule 2: 1.4 x 1.3 x 0.9 cm solid hypoechoic right inferior thyroid  nodule (TI-RADS 4) meets criteria for imaging surveillance. _________________________________________________________ Nodules 3: 0.9 x 0.8 x 0.5 cm spongiform nodule in the superior left thyroid  lobe (TI-RADS 1) does not meet criteria for imaging surveillance or FNA. _________________________________________________________ Nodule 4: 1.2 x 0.9 x 0.8 cm solid isoechoic left inferior thyroid  nodule (TI-RADS 3) does not meet criteria for imaging surveillance or FNA. IMPRESSION: Nodule 2 (TI-RADS 4), measuring 1.4 cm, located in the inferior right thyroid  lobe meets criteria for imaging follow-up. Annual ultrasound surveillance is recommended until 5 years of stability is documented. The above is in keeping with the ACR TI-RADS recommendations - J Am Coll Radiol 2017;14:587-595. Electronically Signed   By: Aliene Lloyd M.D.   On: 04/12/2023 13:58     Discharge Exam: Vitals:   05/03/23 0301 05/03/23 0809  BP: 115/68 115/75  Pulse: 85 92  Resp: 16 17  Temp: 97.9 F (36.6 C)   SpO2: 94%    Vitals:   05/02/23 1635 05/02/23 1953 05/03/23 0301 05/03/23 0809  BP: 99/63 109/61 115/68 115/75  Pulse:   88 85 92  Resp:  18 16 17   Temp:  98.2 F (36.8 C) 97.9 F (36.6 C)   TempSrc:  Oral Oral   SpO2:  93% 94%   Weight:   74.8 kg   Height:        General: Pt is alert, awake, not in acute distress Cardiovascular: RRR, S1/S2 +, no rubs, no gallops Respiratory: CTA bilaterally, no wheezing, no rhonchi Abdominal: Soft, NT, ND, bowel sounds + Extremities: no edema, no cyanosis    The results of significant diagnostics from this hospitalization (including imaging, microbiology, ancillary and laboratory) are listed below for reference.     Microbiology: No results found for this or any previous visit (from the past 240 hours).   Labs: BNP (last 3 results) Recent Labs    04/19/23 1656  BNP 82.9   Basic Metabolic Panel: Recent Labs  Lab 05/01/23 0355 05/02/23 0451  NA 136 135  K 4.6 4.4  CL 99 99  CO2 25 27  GLUCOSE 149* 148*  BUN 22 31*  CREATININE 1.01 1.09  CALCIUM  9.5 9.4  MG 1.8 1.8  PHOS 4.0 3.6   Liver Function Tests: Recent Labs  Lab 05/01/23 0355 05/02/23 0451  AST 18 14*  ALT 22 22  ALKPHOS 74 64  BILITOT 0.8 0.8  PROT 6.5 6.6  ALBUMIN 3.2* 3.2*   No results for input(s): LIPASE, AMYLASE in the last 168 hours. No results for input(s): AMMONIA in the last 168 hours. CBC: Recent Labs  Lab 05/01/23 0355 05/02/23 0451  WBC 10.8* 10.8*  NEUTROABS 7.8* 8.5*  HGB 14.2 13.6  HCT 41.4 38.9*  MCV 89.4 89.0  PLT 297 289   Cardiac Enzymes: No results for input(s): CKTOTAL, CKMB, CKMBINDEX, TROPONINI in the last 168 hours. BNP: Invalid input(s): POCBNP CBG: Recent Labs  Lab 05/02/23 0759 05/02/23 1148 05/02/23 1616 05/02/23 2118 05/03/23  0731  GLUCAP 231* 156* 170* 110* 162*   D-Dimer No results for input(s): DDIMER in the last 72 hours. Hgb A1c No results for input(s): HGBA1C in the last 72 hours. Lipid Profile No results for input(s): CHOL, HDL, LDLCALC, TRIG, CHOLHDL, LDLDIRECT in the last 72  hours. Thyroid  function studies No results for input(s): TSH, T4TOTAL, T3FREE, THYROIDAB in the last 72 hours.  Invalid input(s): FREET3 Anemia work up No results for input(s): VITAMINB12, FOLATE, FERRITIN, TIBC, IRON, RETICCTPCT in the last 72 hours. Urinalysis    Component Value Date/Time   COLORURINE YELLOW 04/19/2023 1305   APPEARANCEUR CLEAR 04/19/2023 1305   LABSPEC 1.019 04/19/2023 1305   PHURINE 7.0 04/19/2023 1305   GLUCOSEU NEGATIVE 04/19/2023 1305   GLUCOSEU NEGATIVE 01/11/2017 1146   HGBUR NEGATIVE 04/19/2023 1305   HGBUR negative 11/06/2009 0854   BILIRUBINUR NEGATIVE 04/19/2023 1305   BILIRUBINUR negative 12/27/2022 0814   KETONESUR 20 (A) 04/19/2023 1305   PROTEINUR NEGATIVE 04/19/2023 1305   UROBILINOGEN 0.2 12/27/2022 0814   UROBILINOGEN 0.2 01/11/2017 1146   NITRITE NEGATIVE 04/19/2023 1305   LEUKOCYTESUR NEGATIVE 04/19/2023 1305   Sepsis Labs Recent Labs  Lab 05/01/23 0355 05/02/23 0451  WBC 10.8* 10.8*   Microbiology No results found for this or any previous visit (from the past 240 hours).  FURTHER DISCHARGE INSTRUCTIONS:   Get Medicines reviewed and adjusted: Please take all your medications with you for your next visit with your Primary MD   Laboratory/radiological data: Please request your Primary MD to go over all hospital tests and procedure/radiological results at the follow up, please ask your Primary MD to get all Hospital records sent to his/her office.   In some cases, they will be blood work, cultures and biopsy results pending at the time of your discharge. Please request that your primary care M.D. goes through all the records of your hospital data and follows up on these results.   Also Note the following: If you experience worsening of your admission symptoms, develop shortness of breath, life threatening emergency, suicidal or homicidal thoughts you must seek medical attention immediately by calling 911 or  calling your MD immediately  if symptoms less severe.   You must read complete instructions/literature along with all the possible adverse reactions/side effects for all the Medicines you take and that have been prescribed to you. Take any new Medicines after you have completely understood and accpet all the possible adverse reactions/side effects.    Do not drive when taking Pain medications or sleeping medications (Benzodaizepines)   Do not take more than prescribed Pain, Sleep and Anxiety Medications. It is not advisable to combine anxiety,sleep and pain medications without talking with your primary care practitioner   Special Instructions: If you have smoked or chewed Tobacco  in the last 2 yrs please stop smoking, stop any regular Alcohol   and or any Recreational drug use.   Wear Seat belts while driving.   Please note: You were cared for by a hospitalist during your hospital stay. Once you are discharged, your primary care physician will handle any further medical issues. Please note that NO REFILLS for any discharge medications will be authorized once you are discharged, as it is imperative that you return to your primary care physician (or establish a relationship with a primary care physician if you do not have one) for your post hospital discharge needs so that they can reassess your need for medications and monitor your lab values  Time coordinating discharge: Over  30 minutes  SIGNED:   Fredia Skeeter, MD  [facility] 05/03/2023, 12:05 PM *Please note that this is a verbal dictation therefore any spelling or grammatical errors are due to the Dragon Medical One system interpretation. If 7PM-7AM, please contact night-coverage [email or web address]

## 2023-05-04 ENCOUNTER — Telehealth: Payer: Self-pay | Admitting: Family Medicine

## 2023-05-04 ENCOUNTER — Telehealth: Payer: Self-pay

## 2023-05-04 ENCOUNTER — Ambulatory Visit: Payer: Medicare Other | Admitting: Family Medicine

## 2023-05-04 NOTE — Telephone Encounter (Signed)
 [facility] faxed document Home Health Certificate (Order [place] [id number]), to be filled out by provider. Patient requested to send it back via Fax within 5-days. Document is located in providers tray at front office.Please advise at  [phone]

## 2023-05-04 NOTE — Patient Outreach (Signed)
  Care Management  Transitions of Care Program    05/04/2023 Name: Buster Schueller MRN: [id number] DOB: 1941-04-24  Subjective: Aleksi Brummet is a 83 y.o. year old male who is a primary care patient of Katrinka, Garnette KIDD, MD.       RN CM confirmed pt was discharged from the hospital to [facility] facility.      Plan: No further follow up at this time.  Rama Pilling, RN,BSN,CCM RN Care Manager Transitions of Care  [place]/Population Health  Direct Phone: [phone] Toll Free: [phone] Fax: [phone]

## 2023-05-05 ENCOUNTER — Encounter: Payer: Self-pay | Admitting: Family Medicine

## 2023-05-05 NOTE — Telephone Encounter (Signed)
**Note De-identified  [place] Obfuscation** Please advise 

## 2023-05-05 NOTE — Telephone Encounter (Signed)
 FYI

## 2023-05-08 NOTE — Telephone Encounter (Signed)
 Forms completed and faxed back.

## 2023-05-09 ENCOUNTER — Ambulatory Visit: Payer: Self-pay | Admitting: Licensed Clinical Social Worker

## 2023-05-09 NOTE — Patient Instructions (Signed)
 Visit Information  Thank you for taking time to visit with me today. Please don't hesitate to contact me if I can be of assistance to you.   Following are the goals we discussed today:   Goals Addressed             This Visit's Progress    Obtain Supportive Resources-Caregiver Stress   On track    Activities and task to complete in order to accomplish goals.   Keep all upcoming appointments discussed today Continue with compliance of taking medication prescribed by Doctor Implement healthy coping skills discussed to assist with management of symptoms F/up with [facility] to resume services after anticipated d/c date of 05/11/23         Our next appointment is by telephone on 1/16 at 11 AM  Please call the care guide team at [phone] if you need to cancel or reschedule your appointment.   If you are experiencing a Mental Health or Behavioral Health Crisis or need someone to talk to, please call the Suicide and Crisis Lifeline: 988 call 911   Patient verbalizes understanding of instructions and care plan provided today and agrees to view in MyChart. Active MyChart status and patient understanding of how to access instructions and care plan via MyChart confirmed with patient.     [name], MSW, LCSW [facility] Care Management [place]  [facility] [email or web address] Phone [phone] 3:27 PM

## 2023-05-09 NOTE — Patient Outreach (Signed)
  Care Coordination   Follow Up Visit Note   05/09/2023 Name: Walter [name] MRN: [id number] DOB: 29-Oct-1940  Walter [name] is a 83 y.o. year old male who sees Hunter, Garnette KIDD, MD for primary care. I spoke with  Aarion Metzgar spouse by phone today.  What matters to the patients health and wellness today?  Level of Care    Goals Addressed             This Visit's Progress    Obtain Supportive Resources-Caregiver Stress   On track    Activities and task to complete in order to accomplish goals.   Keep all upcoming appointments discussed today Continue with compliance of taking medication prescribed by Doctor Implement healthy coping skills discussed to assist with management of symptoms F/up with [facility] to resume services after anticipated d/c date of 05/11/23         SDOH assessments and interventions completed:  No     Care Coordination Interventions:  Yes, provided  Interventions Today    Flowsheet Row Most Recent Value  Chronic Disease   Chronic disease during today's visit Diabetes  [Pulmonary Embolism, Depression, and Anxiety]  General Interventions   General Interventions Discussed/Reviewed General Interventions Reviewed, Doctor Visits, Level of Care, Communication with  Doctor Visits Discussed/Reviewed Doctor Visits Reviewed  Communication with RN  [facility] with [facility] RN regarding possible f/up after SNF d/c]  Level of Care Skilled Nursing Facility  [Pt plans to d/c on 01/09 from SNF. Family will resume services through [facility]]  Mental Health Interventions   Mental Health Discussed/Reviewed Mental Health Reviewed, Coping Strategies  Safety Interventions   Safety Discussed/Reviewed Safety Reviewed       Follow up plan: Follow up call scheduled for 2-4 weeks    Encounter Outcome:  Patient Visit Completed   Rolin Kerns, MSW, LCSW [facility] Care Management [facility] Health  [facility] [email or web address] Phone [phone] 3:26 PM

## 2023-05-11 ENCOUNTER — Encounter: Payer: Self-pay | Admitting: Licensed Clinical Social Worker

## 2023-05-15 ENCOUNTER — Telehealth: Payer: Self-pay

## 2023-05-15 NOTE — Patient Outreach (Signed)
  Care Coordination   Multidisciplinary Case Review Note    05/11/2023 Name: Walter [name] MRN: [id number] DOB: [birth year]  Walter [name] is a 83 y.o. year old male who sees Hunter, Garnette KIDD, MD for primary care.  The  multidisciplinary care team met today to review patient care needs and barriers.    Care Coordination Interventions: Multidisciplinary case discussion to review patient ongoing care coordination needs  Patient to be engaged ongoing by RN Care Manager Dionne Leath and LCSW Lemonte Al to address disease management and social support needs   SDOH assessments and interventions completed:  No     Care Coordination Interventions Activated:  Yes   Care Coordination Interventions:  Yes, provided  Interventions Today    Flowsheet Row Most Recent Value  Chronic Disease   Chronic disease during today's visit Diabetes  General Interventions   General Interventions Discussed/Reviewed Communication with  Communication with RN, Social Work  [employer] with team to discuss patient care needs, barriers, and strategies to strengthen support]       Follow up plan:  Team will continue to follow patient    Multidisciplinary Team Attendees:   Rolin Kerns, LCSW Tobias Moose, BSW, PhD [employer]eds, [facility]  Scribe for Multidisciplinary Case Review:   Rolin Kerns, MSW, LCSW [facility] Care Management [facility] Health  [facility] [email or web address] Phone [phone] 5:03 PM

## 2023-05-15 NOTE — Patient Outreach (Signed)
  Care Coordination   Initial Visit Note   05/15/2023 Name: Walter [name] MRN: [id number] DOB: 1941/03/06  Walter [name] is a 83 y.o. year old male who sees Walter, Garnette KIDD, MD for primary care. I  spoke with spouse Walter [name] by phone today  What matters to the patients health and wellness today?  Getting stronger and getting adjusted to being home    Goals Addressed             This Visit's Progress    COMPLETED: Care Coordination Activities-No follow up required       Care Coordination Interventions: Discussed RN CM services and support. Assessed SDOH.   Spoke with spouse Walter [name]. She reports patient is much better since discharge from facility on 05/11/23. She reports attitude much better. Patient using walker for ambulation, [facility] nurse currently active with PT pending.  Discussed Pulmonary Embolism and diabetes.  No problems currently with management. Patient taking blood thinner and last sugar 121.  Offered  ongoing RN CM calls for education and support.  She declined at this time.         SDOH assessments and interventions completed:  Yes  SDOH Interventions Today    Flowsheet Row Most Recent Value  SDOH Interventions   Food Insecurity Interventions Intervention Not Indicated  Housing Interventions Intervention Not Indicated  Transportation Interventions Intervention Not Indicated  Utilities Interventions Intervention Not Indicated        Care Coordination Interventions:  Yes, provided   Follow up plan: No further intervention required.   Encounter Outcome:  Patient Visit Completed   Walter Burdell J Asa Baudoin, RN, MSN RN Care Manager [facility], Population Health Direct Dial: [phone]  Fax: [phone] Website: [email or web address]

## 2023-05-15 NOTE — Patient Instructions (Signed)
 Visit Information  Thank you for taking time to visit with me today. Please don't hesitate to contact me if I can be of assistance to you.   Following are the goals we discussed today:   Goals Addressed             This Visit's Progress    COMPLETED: Care Coordination Activities-No follow up required       Care Coordination Interventions: Discussed RN CM services and support. Assessed SDOH.   Spoke with spouse [name]. She reports patient is much better since discharge from facility on 05/11/23. She reports attitude much better. Patient using walker for ambulation, [facility] nurse currently active with PT pending.  Discussed Pulmonary Embolism and diabetes.  No problems currently with management. Patient taking blood thinner and last sugar 121.  Offered  ongoing RN CM calls for education and support.  She declined at this time.         If you are experiencing a Mental Health or Behavioral Health Crisis or need someone to talk to, please call the Suicide and Crisis Lifeline: 988   Patient verbalizes understanding of instructions and care plan provided today and agrees to view in MyChart. Active MyChart status and patient understanding of how to access instructions and care plan via MyChart confirmed with patient.     The patient has been provided with contact information for the care management team and has been advised to call with any health related questions or concerns.   Yazmine Sorey J Loukas Antonson, RN, MSN RN Care Manager [facility] Population Health Direct Dial: [phone]  Fax: [phone] Website: [email or web address]

## 2023-05-16 ENCOUNTER — Telehealth: Payer: Self-pay | Admitting: *Deleted

## 2023-05-16 NOTE — Telephone Encounter (Signed)
 Called and lm on Walter [name] vm tcb if still needed.

## 2023-05-16 NOTE — Telephone Encounter (Signed)
 Copied from CRM [id number]. Topic: Clinical - Home Health Verbal Orders >> May 12, 2023  3:56 PM Pinkey ORN wrote: Caller/Agency: [facility] Callback Number: [phone] Service Requested: Nursing Once A Week For 4 Weeks Frequency:  Any new concerns about the patient? Yes >> May 12, 2023  3:59 PM Pinkey ORN wrote: Concerns About Medications Listed Prior To Hospital Discharge.

## 2023-05-17 ENCOUNTER — Ambulatory Visit: Payer: Medicare Other | Admitting: Family Medicine

## 2023-05-17 ENCOUNTER — Encounter: Payer: Self-pay | Admitting: Family Medicine

## 2023-05-17 VITALS — BP 138/70 | HR 74 | Temp 97.0°F | Ht 72.0 in | Wt 175.4 lb

## 2023-05-17 DIAGNOSIS — E1169 Type 2 diabetes mellitus with other specified complication: Secondary | ICD-10-CM | POA: Diagnosis not present

## 2023-05-17 DIAGNOSIS — J189 Pneumonia, unspecified organism: Secondary | ICD-10-CM | POA: Diagnosis not present

## 2023-05-17 DIAGNOSIS — E119 Type 2 diabetes mellitus without complications: Secondary | ICD-10-CM

## 2023-05-17 DIAGNOSIS — G309 Alzheimer's disease, unspecified: Secondary | ICD-10-CM

## 2023-05-17 DIAGNOSIS — F0670 Mild neurocognitive disorder due to known physiological condition without behavioral disturbance: Secondary | ICD-10-CM

## 2023-05-17 DIAGNOSIS — I10 Essential (primary) hypertension: Secondary | ICD-10-CM

## 2023-05-17 DIAGNOSIS — I2699 Other pulmonary embolism without acute cor pulmonale: Secondary | ICD-10-CM | POA: Diagnosis not present

## 2023-05-17 DIAGNOSIS — E785 Hyperlipidemia, unspecified: Secondary | ICD-10-CM

## 2023-05-17 LAB — LIPID PANEL
Cholesterol: 117 mg/dL (ref 0–200)
HDL: 42.7 mg/dL (ref >39.00)
LDL Cholesterol: 52 mg/dL (ref 0–99)
NonHDL: 73.99
Total CHOL/HDL Ratio: 3
Triglycerides: 110 mg/dL (ref 0.0–149.0)
VLDL: 22 mg/dL (ref 0.0–40.0)

## 2023-05-17 LAB — COMPREHENSIVE METABOLIC PANEL
ALT: 23 U/L (ref 0–53)
AST: 16 U/L (ref 0–37)
Albumin: 4.2 g/dL (ref 3.5–5.2)
Alkaline Phosphatase: 58 U/L (ref 39–117)
BUN: 11 mg/dL (ref 6–23)
CO2: 31 meq/L (ref 19–32)
Calcium: 9.7 mg/dL (ref 8.4–10.5)
Chloride: 101 meq/L (ref 96–112)
Creatinine, Ser: 0.76 mg/dL (ref 0.40–1.50)
GFR: 83.72 mL/min (ref >60.00)
Glucose, Bld: 106 mg/dL — ABNORMAL HIGH (ref 70–99)
Potassium: 3.8 meq/L (ref 3.5–5.1)
Sodium: 140 meq/L (ref 135–145)
Total Bilirubin: 0.4 mg/dL (ref 0.2–1.2)
Total Protein: 7 g/dL (ref 6.0–8.3)

## 2023-05-17 LAB — CBC WITH DIFFERENTIAL/PLATELET
Basophils Absolute: 0 10*3/uL (ref 0.0–0.1)
Basophils Relative: 0.4 % (ref 0.0–3.0)
Eosinophils Absolute: 0.1 10*3/uL (ref 0.0–0.7)
Eosinophils Relative: 1 % (ref 0.0–5.0)
HCT: 40.5 % (ref 39.0–52.0)
Hemoglobin: 13.4 g/dL (ref 13.0–17.0)
Lymphocytes Relative: 12 % (ref 12.0–46.0)
Lymphs Abs: 1.1 10*3/uL (ref 0.7–4.0)
MCHC: 33.1 g/dL (ref 30.0–36.0)
MCV: 93 fL (ref 78.0–100.0)
Monocytes Absolute: 0.7 10*3/uL (ref 0.1–1.0)
Monocytes Relative: 8 % (ref 3.0–12.0)
Neutro Abs: 6.9 10*3/uL (ref 1.4–7.7)
Neutrophils Relative %: 78.6 % — ABNORMAL HIGH (ref 43.0–77.0)
Platelets: 200 10*3/uL (ref 150.0–400.0)
RBC: 4.35 Mil/uL (ref 4.22–5.81)
RDW: 14.6 % (ref 11.5–15.5)
WBC: 8.8 10*3/uL (ref 4.0–10.5)

## 2023-05-17 MED ORDER — APIXABAN 5 MG PO TABS: 5.0000 mg | ORAL_TABLET | Freq: Two times a day (BID) | ORAL | 4 refills | Status: DC

## 2023-05-17 NOTE — Progress Notes (Signed)
 Phone [phone] In person visit   Subjective:   Walter [name] is a 83 y.o. year old very pleasant male patient who presents for/with See problem oriented charting Chief Complaint  Patient presents with   hosp f/u    Pt is here for hosp f/u due to pnuemonia   Past Medical History-  Patient Active Problem List   Diagnosis Date Noted   Acute pulmonary embolism (HCC) 04/19/2023    Priority: High   Mild neurocognitive disorder, concerns for Alzheimer's disease 12/23/2019    Priority: High   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    Priority: High   BPH (benign prostatic hyperplasia) 07/02/2015    Priority: High   Diabetes mellitus type II, controlled 04/23/2007    Priority: High   History of GI bleed 04/20/2023    Priority: Medium    Anxiety and depression 04/20/2023    Priority: Medium    Pulmonary nodule 02/04/2022    Priority: Medium    History of colon cancer 01/27/2014    Priority: Medium    Hyperlipidemia associated with type 2 diabetes mellitus 04/23/2007    Priority: Medium    Obstructive sleep apnea 04/23/2007    Priority: Medium    Essential hypertension 04/23/2007    Priority: Medium    History of skin cancer     Priority: Low   Allergic rhinitis 08/19/2019    Priority: Low   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    Priority: Low   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    Priority: Low   CAP (community acquired pneumonia) 05/19/2015    Priority: Low   Hallux valgus of right foot 03/26/2014    Priority: Low   Iritis 12/17/2008    Priority: Low   Postherpetic neuralgia 05/22/2008    Priority: Low   Bigeminy 04/21/2008    Priority: Low   Thyroid  nodule 03/31/2023   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    Medications- reviewed and updated Current Outpatient Medications  Medication Sig Dispense Refill   [employer] STOMACH RELIEF 262 MG chewable tablet Chew 2 tablets by mouth 4 (four) times daily.     donepezil  (ARICEPT ) 10 MG tablet TAKE 1 TABLET BY  MOUTH EVERYDAY AT BEDTIME (Patient taking differently: Take 10 mg by mouth at bedtime.) 90 tablet 1   ferrous sulfate  325 (65 FE) MG tablet Take 1 tablet (325 mg total) by mouth 2 (two) times daily with a meal.     glipiZIDE  (GLUCOTROL ) 10 MG tablet TAKE 1 TABLET (10 MG TOTAL) BY MOUTH TWICE A DAY BEFORE A MEAL 180 tablet 3   Glucagon  (GVOKE HYPOPEN  2-PACK) 0.5 MG/0.1ML SOAJ Inject 0.5 mg into the skin daily as needed (for low blood sugar). 0.2 mL 1   metFORMIN  (GLUCOPHAGE ) 1000 MG tablet TAKE 1 TABLET BY MOUTH TWICE A DAY WITH A MEAL 180 tablet 3   ONETOUCH ULTRA test strip USE ONCE DAILY AS INSTRUCTED 100 strip 3   rosuvastatin  (CRESTOR ) 20 MG tablet TAKE 1 TABLET BY MOUTH ONE TIME PER WEEK 13 tablet 3   sitaGLIPtin  (JANUVIA ) 100 MG tablet Take 1 tablet (100 mg total) by mouth daily. 90 tablet 3   apixaban  (ELIQUIS ) 5 MG TABS tablet Take 1 tablet (5 mg total) by mouth 2 (two) times daily. 60 tablet 4   No current facility-administered medications for this visit.     Objective:  BP 138/70   Pulse 74   Temp (!) 97 F (36.1 C)   Ht 6' (1.829 m)  Wt 175 lb 6.4 oz (79.6 kg)   SpO2 95%   BMI 23.79 kg/m  Gen: NAD, resting comfortably CV: RRR no murmurs rubs or gallops Lungs: CTAB no crackles, wheeze, rhonchi Ext: no edema Skin: warm, dry Neuro: Much more mentally clear today-appears to be at his baseline from months ago in regards to cognition, does walk with walker    Assessment and Plan    # Hospital follow-up for altered mental status possibly related to pulmonary embolism Summary below  Patient presented to the hospital with altered mentation.  Had previously been hospitalized from November 29 until April 02, 2023 at which point was admitted with altered mental status due to sepsis secondary to aspiration pneumonia-this was about a week after COVID-19 vaccination and he had progressive decline after vaccination.  Patient was thought to have delirium on top of baseline dementia.   He did improve with antibiotics.  He had ongoing memory issues upon discharge with plan for outpatient neurology follow-up and he was eventually seen by neurology who recommended continuing donepezil  as well as taking Lexapro/Seroquel  for anxiety/depression with plan for repeat neuropsychiatric testing in 2025. There was never a firm diagnosis of PTSD- this was written as a possibility with prior recurrent dreams.   Patient never returned to baseline to prior to COVID-19 vaccination and then had declined before hospitalization with increase in generalized weakness and eventually significant confusion with associated agitation.  Underwent extensive workup.  He was found to have pulmonary embolism involving right upper lobe and right lower lobe in addition to groundglass opacities in right lower lobe concerning for infection.  He was started on heparin  drip, oxygen, antibiotics and admitted for further workup. -He was hypotensive on admission and antihypertensive medications were held.  Plan was to restart if able if blood pressure trending up - Echocardiogram largely reassuring in regards to the pulmonary embolism-he was loaded with Eliquis  and discharged on 5 mg twice daily - Due to deconditioning he required rehabilitation.  He was denied for CIR.  He was approved for SNF and he was discharged to [facility] healthcare.   -Appears Seroquel  was stopped on discharge from hospital  Pulmonary nodule on scan 04/19/23- plan was no repeat if low risk but with prior smoking we may repeat -other 12 mm nodule which had been scanned by PET-CT in June was stable. Pancreatic lesion was stable as well with plan for 2 year repat   Since discharge patient does have ongoing need for home health-we received a request from [facility] for nursing once a week for 4 weeks-I would also support ongoing physical therapy if needed but appears to be making tremendous strides with mobility  -he feels much better being home- didn't  enjoy the food there or being away from home - apparently in rehabhe was doing physical therapy daily and he requested to do more frequently he was feeling so well.  -per wife and patient speech much more clear and cognitive abilities have drastically improved- they have reflected back aond wonder if he was even over the pneumonia at last - has become more and more clear in rehab  (came home about 6 days ago) and in last 2-3 days in particular. A flood of memories has come back to him- able to explain hospitalization and even a fall in the hospital where he injured his arm and face (had CT in hospital)  Some phlegm and nasal congestion- back on home allergy pills- helping some- worsened back at home.  No shortness of  breath reported and he did complete course of antibiotics in the hospital including azithromycin  and ceftriaxone  for 5 days as well as vancomycin  and Zosyn  initially.  Has restarted CPAP at home   # Pulmonary embolism-diagnosed during December 2024 hospitalization with plans for 6 months of treatment at least through June 2025-potentially related to sedentary activity with pneumonia versus possible provocation by COVID vaccination - compliant with eliquis  5 mg twice daily. No DVT on 04/20/23.  -Continue current medication and I refilled this for 5 additional months plus the 1 month he already has -Did have some physical setback from recent hospitalizations-using a walker when out of the home but tolerating without any difficulty at home without this -Will need 2-year follow-up imaging for newest pulmonary nodule and pancreatic cyst  # Pneumonia-treated recently with ceftriaxone  and azithromycin -we will plan on repeat x-ray at next visit to document clearance less likely -Plan for Prevnar 20 at follow-up if continues to do well  # Mild cognitive impairment-follows with Dr. Ty Gales S: Medication: Donepezil  10 mg -worsened around 2021 then again in late 2024 after COVID shot then  aspiration pneumonia then PULMONARY EMBOLISM  A/P: Patient has had substantial improvement in his cognition.  I was honest with patient at last visit I was worried about a substantial decline and had changed his listing to dementia-with improvement we will wait until neurology reassess his - Continue donepezil  - Looking back the first noted decline in memory was April 2021 within 2 months of first COVID vaccinations and this most recent decline started the day of COVID-19 vaccination-I recommended against repeat at this point-cannot firmly connect these but I think possible risks outweigh benefits.  In addition there are case reports of pulmonary embolism related to mRNA vaccination though these are sparing-also possible his decline was related to underlying pulmonary embolism that was not initially noted.  Also possibly he never fully recovered from the first pneumonia and has just began to do so in recent days -He asks about driving and he has never had any driving issues-2 citations total in his life.  My suggestion was for him to only drive with his wife for the next month so she can monitor him closely and if does well can drive on his own again but would suggest tracking if possible and if any obvious decline in mentation would hold off on driving.  We could also get neurology's opinion   # Diabetes-poorly controlled at times  S: Medication:glipizide  10 mg twice daily , januvia  100mg  daily, metformin   1000mg  twice daily   CBGs- 121 on 11th and 12th 132, 14th 150 capillary blood glucose, 128 at home Lab Results  Component Value Date   HGBA1C 7.1 (H) 04/11/2023   HGBA1C 7.4 (H) 12/27/2022   HGBA1C 7.8 (H) 08/23/2022  A/P: Improved control on most recent check-continue current medication  #hypertension S: medication: None -On discharge May 03, 2023-stopped amlodipine  5 mg every day and telmisartan  80 mg Home readings #s: 119/68 on 12th and pulse 70, yesterday 142/82, today 133/73 at  home A/P: blood pressure controlled without medicine but they are going to watch closely at home and update me if average back over 140. For now with how much improvement he has had I do not want to make any changes unless we have to  #hyperlipidemia S: Medication: Rosuvastatin  20 mg once a week  Lab Results  Component Value Date   CHOL 120 04/21/2022   HDL 30.20 (L) 04/21/2022   LDLCALC 59 04/21/2022  LDLDIRECT 90.0 09/14/2017   TRIG 156.0 (H) 04/21/2022   CHOLHDL 4 04/21/2022  A/P: At goal last year but due for repeat-update today and continue current medication  # GERD and history h pylori related ulcer/anemia-no obvious recent recurrence on pantoprazole .  Update CBC today  #dry patch on left thigh- anti itch cream planned (but he has not had tried yet) appears to be some irritated seborrheic keratosis-if does not improve could follow-up with dermatology  # Anxiety/agitation-much improved as cognition has improved.  Continue Lexapro alone-remain off of Seroquel  Recommended follow up: Return for next already scheduled visit or sooner if needed. Future Appointments  Date Time Provider Department Center  05/18/2023 11:00 AM Alease Nikhil Osei D, LCSW [facility] None  06/01/2023  8:45 AM Charity Conch, DPM [facility]  06/28/2023  1:40 PM [facility] ANNUAL WELLNESS VISIT 1 [facility]  07/05/2023  8:20 AM Almira Jaeger, MD [facility]  09/29/2023 11:30 AM Rosi Converse, PA-C [facility] None  11/01/2023  1:00 PM Rowan Cooter, PhD [facility] None  11/01/2023  2:00 PM [facility]SYCH TECH [facility] None  11/08/2023  2:30 PM Rowan Cooter, PhD [facility] None    Lab/Order associations:   ICD-10-CM   1. Essential hypertension  I10 Comprehensive metabolic panel    CBC with Differential/Platelet    2. Hyperlipidemia associated with type 2 diabetes mellitus (HCC) Chronic E11.69 Lipid panel   E78.5     3. Other acute pulmonary embolism without acute cor pulmonale (HCC)  I26.99     4.  Community acquired pneumonia of right lower lobe of lung  J18.9     5. Controlled type 2 diabetes mellitus without complication, without long-term current use of insulin  (HCC)  E11.9     6. Mild neurocognitive disorder, concerns for Alzheimer's disease  G30.9    F06.70       Meds ordered this encounter  Medications   apixaban  (ELIQUIS ) 5 MG TABS tablet    Sig: Take 1 tablet (5 mg total) by mouth 2 (two) times daily.    Dispense:  60 tablet    Refill:  4   Time Spent: 45 minutes of total time (11:30 AM- 12:15) was spent on the date of the encounter performing the following actions: chart review prior to seeing the patient, obtaining history, performing a medically necessary exam, counseling on the treatment plan and discussion about pulmonary embolism as well as memory changes, placing orders, and documenting in our EHR.   Return precautions advised.  Clarisa Crooked, MD

## 2023-05-17 NOTE — Patient Instructions (Addendum)
 blood pressure controlled without medicine but they are going to watch closely at home and update me if average back over 140. For now with how much improvement he has had I do not want to make any changes unless we have to  Stay on eliquis  for 6 months  Please stop by lab before you go If you have mychart- we will send your results within 3 business days of us  receiving them.  If you do not have mychart- we will call you about results within 5 business days of us  receiving them.  *please also note that you will see labs on mychart as soon as they post. I will later go in and write notes on them- will say "notes from Dr. Arlene Ben"   Recommended follow up: Return for next already scheduled visit or sooner if needed. In march

## 2023-05-18 ENCOUNTER — Ambulatory Visit: Payer: Self-pay | Admitting: Licensed Clinical Social Worker

## 2023-05-22 NOTE — Patient Outreach (Signed)
  Care Coordination   Follow Up Visit Note   05/18/2023 Name: Walter [name] MRN: [id number] DOB: [birth year]  Walter [name] is a 83 y.o. year old male who sees Walter [name], Walter Contes, MD for primary care. I spoke with  Walter [name] spouse by phone today.  What matters to the patients health and wellness today?  Caregiver Stress and Level of Care    Goals Addressed             This Visit's Progress    Obtain Supportive Resources-Caregiver Stress   On track    Activities and task to complete in order to accomplish goals.   Keep all upcoming appointments discussed today Continue with compliance of taking medication prescribed by Doctor Implement healthy coping skills discussed to assist with management of symptoms          SDOH assessments and interventions completed:  No     Care Coordination Interventions:  Yes, provided  Interventions Today    Flowsheet Row Most Recent Value  Chronic Disease   Chronic disease during today's visit Diabetes, Other  [Mild neurocognitive disorder, Anxiety, and Depression]  General Interventions   General Interventions Discussed/Reviewed General Interventions Reviewed, Level of Care, Doctor Visits  [Pt's family reports he has been doing well since returning home on the 9th. He is sleeping and eating "better"]  Doctor Visits Discussed/Reviewed Doctor Visits Reviewed  Level of Care Personal Care Services  [PT from [facility] visited this afternoon. OT visited on Monday]  Mental Health Interventions   Mental Health Discussed/Reviewed Mental Health Reviewed, Coping Strategies  [Spouse endorsed decrease in caregiver stress]  Pharmacy Interventions   Pharmacy Dicussed/Reviewed Pharmacy Topics Reviewed, Medication Adherence  Safety Interventions   Safety Discussed/Reviewed Safety Reviewed       Follow up plan: Follow up call scheduled for 2-4 weeks    Encounter Outcome:  Patient Visit Completed   Walter Lucks, LCSW [place]  [facility],  [facility] Clinical Social Worker Direct Dial: [phone]  Fax: [phone] Website: [email or web address] 5:32 AM

## 2023-05-22 NOTE — Patient Instructions (Signed)
Visit Information  Thank you for taking time to visit with me today. Please don't hesitate to contact me if I can be of assistance to you.   Following are the goals we discussed today:   Goals Addressed             This Visit's Progress    Obtain Supportive Resources-Caregiver Stress   On track    Activities and task to complete in order to accomplish goals.   Keep all upcoming appointments discussed today Continue with compliance of taking medication prescribed by Doctor Implement healthy coping skills discussed to assist with management of symptoms          Our next appointment is by telephone on 2/13 at 11 AM  Please call the care guide team at [phone] if you need to cancel or reschedule your appointment.   If you are experiencing a Mental Health or Behavioral Health Crisis or need someone to talk to, please call the Suicide and Crisis Lifeline: 988 call 911   Patient verbalizes understanding of instructions and care plan provided today and agrees to view in MyChart. Active MyChart status and patient understanding of how to access instructions and care plan via MyChart confirmed with patient.     Windy Fast [facility]  [facility], [facility] Clinical Social Worker Direct Dial: [phone]  Fax: [phone] Website: [email or web address] 5:33 AM

## 2023-06-01 ENCOUNTER — Ambulatory Visit: Payer: Medicare Other | Admitting: Podiatry

## 2023-06-01 ENCOUNTER — Encounter: Payer: Self-pay | Admitting: Podiatry

## 2023-06-01 DIAGNOSIS — E1149 Type 2 diabetes mellitus with other diabetic neurological complication: Secondary | ICD-10-CM

## 2023-06-01 DIAGNOSIS — L84 Corns and callosities: Secondary | ICD-10-CM

## 2023-06-01 DIAGNOSIS — Z7901 Long term (current) use of anticoagulants: Secondary | ICD-10-CM

## 2023-06-01 DIAGNOSIS — B351 Tinea unguium: Secondary | ICD-10-CM

## 2023-06-01 DIAGNOSIS — M79675 Pain in left toe(s): Secondary | ICD-10-CM

## 2023-06-01 DIAGNOSIS — M79674 Pain in right toe(s): Secondary | ICD-10-CM | POA: Diagnosis not present

## 2023-06-01 NOTE — Progress Notes (Signed)
Subjective: Chief Complaint  Patient presents with   [facility]    RM#12 [facility] patient has back of left heel that need evaluation. Recently discharged from hospital stay and rehab.    83 year old male presents the office today with above concerns.  States the nails are thickened elongated he cannot trim them himself and causing discomfort.  No open lesions.  He states that the shoe was rubbing the back of his left heel he had a small callus.  This is coming more for the insert rolled up he states.  Last A1c was 7.4 in December 27, 2022  Shelva Majestic, MD Last seen 10/06/2022  On Eliquis   Objective: AAO x3, NAD DP/PT pulses palpable bilaterally, CRT less than 3 seconds Nails are hypertrophic, dystrophic, brittle, discolored, elongated 9. No surrounding redness or drainage. 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  Small hyperkeratotic lesion of the left posterior heel without any underlying ulceration, drainage or any signs of infection.  There is no open lesions present. No pain with calf compression, swelling, warmth, erythema  Assessment: 83 year old male with symptomatic onychomycosis; preulcerative callus  Plan: -All treatment options discussed with the patient including all alternatives, risks, complications.  -Sharply debrided the nails x 9 without any complications or bleeding. -Sharply debrided the hyperkeratotic lesion x 1 without any complications or bleeding. -Patient encouraged to call the office with any questions, concerns, change in symptoms.   Return in about 3 months (around 08/30/2023).  Vivi Barrack DPM

## 2023-06-07 ENCOUNTER — Other Ambulatory Visit: Payer: Self-pay | Admitting: Family Medicine

## 2023-06-09 ENCOUNTER — Telehealth: Payer: Self-pay | Admitting: Family Medicine

## 2023-06-09 NOTE — Telephone Encounter (Signed)
 Patient dropped off document Home Health Certificate (Order ID [id number]), to be filled out by provider. Patient requested to send it back via Fax within 5-days. Document is located in providers tray at front office.Please advise at Mobile [phone] (mobile)

## 2023-06-12 NOTE — Telephone Encounter (Signed)
Placed in to be faxed folder

## 2023-06-12 NOTE — Telephone Encounter (Signed)
Paperwork placed in provider sign folder

## 2023-06-13 ENCOUNTER — Other Ambulatory Visit: Payer: Self-pay | Admitting: Neurology

## 2023-06-13 NOTE — Telephone Encounter (Signed)
Form has been faxed.

## 2023-06-15 ENCOUNTER — Ambulatory Visit: Payer: Medicare Other | Attending: Family Medicine | Admitting: Audiologist

## 2023-06-15 ENCOUNTER — Encounter: Payer: Self-pay | Admitting: Psychology

## 2023-06-15 ENCOUNTER — Ambulatory Visit: Payer: Self-pay | Admitting: Licensed Clinical Social Worker

## 2023-06-15 DIAGNOSIS — H903 Sensorineural hearing loss, bilateral: Secondary | ICD-10-CM | POA: Insufficient documentation

## 2023-06-15 NOTE — Procedures (Signed)
  Outpatient Audiology and [facility] [address] [place], [place]  [address] [phone]  AUDIOLOGICAL  EVALUATION  NAME: Walter [name]     DOB:   08/25/1940      MRN: [id number]                                                                                     DATE: 06/15/2023     REFERENT: Shelva Majestic, MD STATUS: Outpatient DIAGNOSIS: Sensorineural Hearing Loss Bilateral    History: [name] was seen for an audiological evaluation due to difficulty hearing his wife. When his wife is in the kitchen with the TV on and he is in the next room he cannot understand her. He had a hearing test years ago and is not sure if he had loss then. Walter [name] denies pain, pressure, or tinnitus. Walter [name] has significant history of hazardous noise exposure in the [employer] working around [employer] for many hours at a time. He would occasionally lose hearing. This was sometimes due to wax occlusion.  Medical history shows no additional risk for hearing loss.   Evaluation:  Otoscopy showed a clear view of the tympanic membranes, bilaterally Tympanometry results were consistent with normal middle ear function, bilaterally   Audiometric testing was completed using Conventional Audiometry techniques with insert earphones and supraural headphones. 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 Asymmetry at [facility] only. Speech Recognition Thresholds were obtained at 35 dB HL in the right ear and at 35  dB HL in the left ear. Word Recognition Testing was completed at  40dB SL and Soren scored 92% in the right ear and 72% in the left ear.    Results:  The test results were reviewed with Walter [name] and his wife. [name] has a sloping hearing loss in both ears. It is worse in the left. He is aware of the asymmetry. Walter [name] is a hearing aid candidate. He is ready to pursue hearing aids.  Audiogram printed and provided to [place]. Handout about [employer] provided to patient.     Recommendations: Hearing aids recommended for both ears. Patient given list of local hearing aid providers.  Annual audiometric testing recommended to monitor hearing loss for progression.  Recommend referral to Otolaryngology is asymmetric pain, pressure, or tinnitus occurs. Or asymmetry increases.    43 minutes spent testing and counseling on results.   If you have any questions please feel free to contact me at [phone].  Ammie Ferrier Au.D.  Audiologist   06/15/2023  2:45 PM  Cc: Shelva Majestic, MD

## 2023-06-16 NOTE — Patient Instructions (Signed)
Visit Information  Thank you for taking time to visit with me today. Please don't hesitate to contact me if I can be of assistance to you.   Following are the goals we discussed today:   Goals Addressed             This Visit's Progress    Obtain Supportive Resources-Caregiver Stress   On track    Activities and task to complete in order to accomplish goals.   Keep all upcoming appointments discussed today Continue with compliance of taking medication prescribed by Doctor Implement healthy coping skills discussed to assist with management of symptoms          Our next appointment is by telephone on 3/6 at 11:30 AM  Please call the care guide team at [phone] if you need to cancel or reschedule your appointment.   If you are experiencing a Mental Health or Behavioral Health Crisis or need someone to talk to, please call the Suicide and Crisis Lifeline: 988 call 911   Patient verbalizes understanding of instructions and care plan provided today and agrees to view in MyChart. Active MyChart status and patient understanding of how to access instructions and care plan via MyChart confirmed with patient.     Windy Fast [facility]  [facility], [facility] Clinical Social Worker Direct Dial: [phone]  Fax: [phone] Website: [email or web address] 5:43 PM

## 2023-06-16 NOTE — Patient Outreach (Signed)
  Care Coordination   Follow Up Visit Note   06/15/2023 Name: Walter [name] MRN: [id number] DOB: 1941/02/02  Walter [name] is a 83 y.o. year old male who sees Hunter, Aldine Contes, MD for primary care. I spoke with  Lino Wickliff spouse by phone today.  What matters to the patients health and wellness today?  Level of care, Supportive Resources, Symptom Management    Goals Addressed             This Visit's Progress    Obtain Supportive Resources-Caregiver Stress   On track    Activities and task to complete in order to accomplish goals.   Keep all upcoming appointments discussed today Continue with compliance of taking medication prescribed by Doctor Implement healthy coping skills discussed to assist with management of symptoms          SDOH assessments and interventions completed:  No     Care Coordination Interventions:  Yes, provided  Interventions Today    Flowsheet Row Most Recent Value  Chronic Disease   Chronic disease during today's visit Diabetes, Other  [Anxiety and Depression]  General Interventions   General Interventions Discussed/Reviewed General Interventions Reviewed, Doctor Visits, Level of Care  [Spouse reports pt is doing well since being d/c home]  Doctor Visits Discussed/Reviewed Doctor Visits Reviewed  Level of Care --  [Pt has completed OT and RN [facility] services. Continues to participate in Speech and Physical Therapy]  Exercise Interventions   Exercise Discussed/Reviewed Exercise Reviewed  [Pt has went on a walk with spouse this week]  Mental Health Interventions   Mental Health Discussed/Reviewed Mental Health Reviewed, Coping Strategies  [Pt has been maintaining motivation to continue with services. He continues to strengthen independence]  Nutrition Interventions   Nutrition Discussed/Reviewed Nutrition Reviewed  Pharmacy Interventions   Pharmacy Dicussed/Reviewed Pharmacy Topics Reviewed, Medication Adherence  Safety Interventions   Safety  Discussed/Reviewed Safety Reviewed       Follow up plan: Follow up call scheduled for 2-4 weeks    Encounter Outcome:  Patient Visit Completed   [name], LCSW [place]  [facility], [facility] Clinical Social Worker Direct Dial: [phone]  Fax: [phone] Website: [email or web address] 5:42 PM

## 2023-06-19 ENCOUNTER — Telehealth: Payer: Self-pay

## 2023-06-19 NOTE — Telephone Encounter (Signed)
 Copied from CRM [id number]. Topic: Clinical - Home Health Verbal Orders >> Jun 16, 2023  1:28 PM Fredrich Romans wrote: Caller/Agency: [facility] Callback Number: [phone] Called to report that Patient had vital sign of b/p 169/78  out of their parameters ,no other symptoms  Please see [facility] call note and advise if anything needed for the patient at this time; otherwise just [facility]

## 2023-06-19 NOTE — Telephone Encounter (Signed)
 Returned call and advised pt and pt wife Mervyn Gay, pt verbalized understanding to check BP BID and record readings for a few days. Patient will update PCP on readings via MyChart

## 2023-06-19 NOTE — Telephone Encounter (Signed)
 This is above his usual readings-have patient recheck for a few days and then update me

## 2023-06-28 ENCOUNTER — Ambulatory Visit (INDEPENDENT_AMBULATORY_CARE_PROVIDER_SITE_OTHER): Payer: Medicare Other

## 2023-06-28 VITALS — Ht 72.0 in | Wt 175.0 lb

## 2023-06-28 DIAGNOSIS — Z0000 Encounter for general adult medical examination without abnormal findings: Secondary | ICD-10-CM

## 2023-06-28 NOTE — Progress Notes (Signed)
 Subjective:   Walter [name] is a 83 y.o. who presents for a Medicare Wellness preventive visit.  Visit Complete: Virtual I connected with  Walter [name] on 06/28/23 by a audio enabled telemedicine application and verified that I am speaking with the correct person using two identifiers.  Patient Location: Home  Provider Location: Home Office  I discussed the limitations of evaluation and management by telemedicine. The patient expressed understanding and agreed to proceed.  Vital Signs: Because this visit was a virtual/telehealth visit, some criteria may be missing or patient reported. Any vitals not documented were not able to be obtained and vitals that have been documented are patient reported.    AWV Questionnaire: No: Patient Medicare AWV questionnaire was not completed prior to this visit.  Cardiac Risk Factors include: advanced age (>94men, >36 women);hypertension;diabetes mellitus;dyslipidemia;male gender     Objective:    Today's Vitals   06/28/23 1341  Weight: 175 lb (79.4 kg)  Height: 6' (1.829 m)   Body mass index is 23.73 kg/m.     06/28/2023    1:47 PM 04/20/2023   11:45 AM 04/07/2023    3:10 PM 03/31/2023    4:14 PM 03/23/2023    1:03 PM 10/10/2022    3:42 PM 09/20/2022    3:10 PM  Advanced Directives  Does Patient Have a Medical Advance Directive? Yes Yes Yes Yes Yes Yes Yes  Type of Estate agent of [place];Living will Healthcare Power of [employer] Power of [employer] Power of [place];Living will Healthcare Power of [employer] Power of [employer] Power of Attorney  Does patient want to make changes to medical advance directive?  No - Guardian declined No - Patient declined Yes (Inpatient - patient defers changing a medical advance directive and declines information at this time) No - Patient declined  No - Guardian declined  Copy of Healthcare Power of Attorney in Chart? No - copy requested No - copy  requested No - copy requested No - copy requested No - copy requested No - copy requested No - copy requested    Current Medications (verified) Outpatient Encounter Medications as of 06/28/2023  Medication Sig   [employer] STOMACH RELIEF 262 MG chewable tablet Chew 2 tablets by mouth 4 (four) times daily.   donepezil (ARICEPT) 10 MG tablet TAKE 1 TABLET BY MOUTH EVERYDAY AT BEDTIME   ferrous sulfate 325 (65 FE) MG tablet Take 1 tablet (325 mg total) by mouth 2 (two) times daily with a meal.   glipiZIDE (GLUCOTROL) 10 MG tablet TAKE 1 TABLET (10 MG TOTAL) BY MOUTH TWICE A DAY BEFORE A MEAL   Glucagon (GVOKE HYPOPEN 2-PACK) 0.5 MG/0.1ML SOAJ Inject 0.5 mg into the skin daily as needed (for low blood sugar).   JANUVIA 100 MG tablet TAKE 1 TABLET BY MOUTH EVERY DAY   metFORMIN (GLUCOPHAGE) 1000 MG tablet TAKE 1 TABLET BY MOUTH TWICE A DAY WITH A MEAL   ONETOUCH ULTRA test strip USE ONCE DAILY AS INSTRUCTED   rosuvastatin (CRESTOR) 20 MG tablet TAKE 1 TABLET BY MOUTH ONE TIME PER WEEK   apixaban (ELIQUIS) 5 MG TABS tablet Take 1 tablet (5 mg total) by mouth 2 (two) times daily.   No facility-administered encounter medications on file as of 06/28/2023.    Allergies (verified) Bactrim [sulfamethoxazole-trimethoprim]   History: Past Medical History:  Diagnosis Date   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   S/p 2nd ray amputation 2018- started after prolonged use of corn pad  Acute upper GI bleeding 01/26/2022   Allergic rhinitis 08/19/2019   flonase   Anemia    BPH (benign prostatic hyperplasia) 07/02/2015   S/p TURP with multiple complications afterwards including recurrent hospitalizations. All started after a hemorrhoid surgery and later with urinary retention. Patient at one point was septic from urinary issues.     Diabetes mellitus type II, controlled 04/23/2007    Metformin 1g BID, amaryl 4mg --> 8mg , had to add [place] back  Never took victoza.   Januvia-lethargic and dizzy in  past. 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 Follows with Dr. 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   Pulmonary nodule 02/04/2022   Sepsis secondary to UTI 03/29/2015   Severe sepsis (HCC) 03/31/2023   Past Surgical History:  Procedure Laterality Date   AMPUTATION TOE Right 09/14/2016   Procedure: RIGHT 2ND TOE/RAY AMPUTATION;  Surgeon: Tarry Kos, MD;  Location: [facility];  Service: Orthopedics;  Laterality: Right;   APPENDECTOMY  05/03/1947   BIOPSY  01/28/2022   Procedure: BIOPSY;  Surgeon: Sherrilyn Rist, MD;  Location: [facility] END[facility]COPY;  Service: Gastroenterology;;   COLECTOMY  05/02/1990   CYSTOSCOPY N/A 06/21/2015   Procedure: CYSTOSCOPY, CLOT EVACUATION, AND CAUTERIZATION OF PROSTATE FOSSA;  Surgeon: Jethro Bolus, MD;  Location: [facility];  Service: Urology;  Laterality: N/A;   ESOPHAGOGASTRODUODENOSCOPY (EGD) WITH PROPOFOL N/A 01/26/2022   Procedure: ESOPHAGOGASTRODUODENOSCOPY (EGD) WITH PROPOFOL;  Surgeon: Sherrilyn Rist, MD;  Location: [facility] ENDOSCOPY;  Service: Gastroenterology;  Laterality: N/A;    ESOPHAGOGASTRODUODENOSCOPY (EGD) WITH PROPOFOL N/A 01/28/2022   Procedure: ESOPHAGOGASTRODUODENOSCOPY (EGD) WITH PROPOFOL;  Surgeon: Sherrilyn Rist, MD;  Location: [facility] END[facility]COPY;  Service: Gastroenterology;  Laterality: N/A;   HEMORRHOID SURGERY     HEMOSTASIS CLIP PLACEMENT  01/26/2022   Procedure: HEMOSTASIS CLIP PLACEMENT;  Surgeon: Sherrilyn Rist, MD;  Location: [facility] ENDOSCOPY;  Service: Gastroenterology;;   HEMOSTASIS CONTROL  01/26/2022   Procedure: HEMOSTASIS CONTROL;  Surgeon: Sherrilyn Rist, MD;  Location: [facility] ENDOSCOPY;  Service: Gastroenterology;;   HOT HEMOSTASIS N/A 01/26/2022   Procedure: HOT HEMOSTASIS (ARGON PLASMA COAGULATION/BICAP);  Surgeon: Sherrilyn Rist, MD;  Location: [facility] ENDOSCOPY;  Service: Gastroenterology;  Laterality: N/A;   MOLE REMOVAL  2022   hand   SCLEROTHERAPY  01/26/2022   Procedure: SCLEROTHERAPY;  Surgeon: Sherrilyn Rist, MD;  Location: [facility] ENDOSCOPY;  Service: Gastroenterology;;   TOE SURGERY  04/2020   TRANSURETHRAL RESECTION OF PROSTATE N/A 05/23/2015   Procedure: TRANSURETHRAL RESECTION OF THE PROSTATE (TURP);  Surgeon: Bjorn Pippin, MD;  Location: [facility];  Service: Urology;  Laterality: N/A;   Family History  Problem Relation Age of Onset   Alzheimer's disease Mother    Colon cancer Father 63   Diabetes Father        type I   Colon cancer Paternal Grandmother 59   Rectal cancer Maternal Uncle    Cancer Other        colon/fhx   Esophageal cancer Neg Hx    Stomach cancer Neg Hx    Social History   Socioeconomic History   Marital status: Married    Spouse name: Not on file   Number of children: 2   Years of education: 18   Highest education level: Master's degree (e.g., MA, MS, MEng, MEd, MSW, MBA)  Occupational History   Occupation: Retired  Tobacco Use   Smoking status: Former    Current packs/day: 0.25    Average packs/day: 0.3 packs/day for 10.0 years (2.5 ttl pk-yrs)    Types: Pipe, Cigarettes   Smokeless tobacco:  Never  Vaping Use   Vaping status: Never Used  Substance and Sexual Activity   Alcohol use: Not Currently    Alcohol/week: 2.0 standard drinks of alcohol    Types: 2 Cans of beer per week    Comment: social   Drug use: No   Sexual activity: Not Currently  Other Topics Concern   Not on file  Social History Narrative   Married 1966. 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       Retired 2001-[employer]      Hobbies: hunting, fishing, walking, Geneticist, molecular (600-700 rounds per week)      No religious beliefs affecting health care, no afterlife beliefs      Right Handed      Two Story Home   Social Drivers of Health   Financial Resource Strain: Low Risk  (06/28/2023)   Overall Financial Resource Strain (CARDIA)    Difficulty of Paying Living Expenses: Not hard at all  Food Insecurity: No Food Insecurity (06/28/2023)   Hunger Vital Sign    Worried About Running Out of Food in the Last Year: Never true    Ran Out of Food in the Last Year: Never true  Transportation Needs: No Transportation Needs (06/28/2023)   PRAPARE - Administrator, Civil Service (Medical): No    Lack of Transportation (Non-Medical): No  Physical Activity: Sufficiently Active (06/28/2023)   Exercise Vital Sign    Days of Exercise per Week: 3 days    Minutes of Exercise per Session: 60 min  Stress: No Stress Concern Present (06/28/2023)   [employer] of Occupational Health - Occupational Stress Questionnaire    Feeling of Stress : Not at all  Social Connections: Moderately Integrated (06/28/2023)   Social Connection and Isolation Panel [NHANES]    Frequency of Communication with Friends and Family: Once a week    Frequency of Social Gatherings with Friends and Family: More than three times a week    Attends Religious Services: Never    Database administrator  or Organizations: Yes    Attends Banker Meetings: 1 to 4 times per year    Marital Status: Married    Tobacco Counseling Counseling given: Not Answered    Clinical Intake:  Pre-visit preparation completed: Yes  Pain :  No/denies pain     BMI - recorded: 23.73 Nutritional Status: BMI of 19-24  Normal Diabetes: Yes CBG done?: No Did pt. bring in CBG monitor from home?: No  How often do you need to have someone help you when you read instructions, pamphlets, or other written materials from your doctor or pharmacy?: 1 - Never  Interpreter Needed?: No  Information entered by :: Lanier Ensign, LPN   Activities of Daily Living     06/28/2023    1:44 PM 04/20/2023   11:45 AM  In your present state of health, do you have any difficulty performing the following activities:  Hearing? 0 1  Vision? 0 0  Difficulty concentrating or making decisions? 0 1  Walking or climbing stairs? 0   Dressing or bathing? 0   Doing errands, shopping? 0 1  Preparing Food and eating ? N   Using the Toilet? N   In the past six months, have you accidently leaked urine? N   Do you have problems with loss of bowel control? N   Managing your Medications? N   Managing your Finances? N   Housekeeping or managing your Housekeeping? N     Patient Care Team: Shelva Majestic, MD as PCP - General (Family Medicine) Karie Soda, MD as Consulting Physician (General Surgery) Meryl Dare, MD (Inactive) as Consulting Physician (Gastroenterology) Bjorn Pippin, MD as Attending Physician (Urology) Mckinley Jewel, MD as Consulting Physician (Ophthalmology) Van Clines, MD as Consulting Physician (Neurology) Bridgett Larsson, LCSW as VBCI Care Management (Licensed Clinical Social Worker)  Indicate any recent Medical Services you may have received from other than [facility] providers in the past year (date may be approximate).     Assessment:   This is a routine wellness examination  for Walter [name].  Hearing/Vision screen Hearing Screening - Comments:: Pt denies hearing issues  Vision Screening - Comments:: Pt follows up with Dr Cathey Endow for annual eye exams    Goals Addressed             This Visit's Progress    Patient Stated       Work toward better health        Depression Screen     06/28/2023    1:47 PM 05/15/2023   12:46 PM 04/11/2023    2:20 PM 08/22/2022    8:27 AM 05/12/2022   10:12 AM 05/04/2021   10:48 AM 03/02/2021    9:16 AM  PHQ 2/9 Scores  PHQ - 2 Score 0  4 0 0 0 0  PHQ- 9 Score   12 2     Exception Documentation  Other- indicate reason in comment box       Not completed  Wife completes assessment         Fall Risk     06/28/2023    1:49 PM 05/15/2023   12:42 PM 04/11/2023    2:19 PM 04/07/2023    3:10 PM 03/23/2023    1:03 PM  Fall Risk   Falls in the past year? 1 1 1 1 1   Number falls in past yr: 1 1 1 1 1   Injury with Fall? 1 0 0 1 0  Risk for fall due to : Impaired balance/gait;Impaired mobility;History of fall(s) Impaired mobility History of fall(s)    Risk for fall due to: Comment due to pnemonia      Follow up Falls prevention discussed Falls prevention discussed Falls evaluation completed Falls evaluation completed Falls evaluation completed  MEDICARE RISK AT HOME:  Medicare Risk at Home Any stairs in or around the home?: Yes If so, are there any without handrails?: No Home free of loose throw rugs in walkways, pet beds, electrical cords, etc?: Yes Adequate lighting in your home to reduce risk of falls?: Yes Life alert?: No Use of a cane, walker or w/c?: No Grab bars in the bathroom?: Yes Shower chair or bench in shower?: Yes Elevated toilet seat or a handicapped toilet?: Yes  TIMED UP AND GO:  Was the test performed?  No  Cognitive Function: 6CIT completed    09/20/2022    5:00 PM 09/02/2021   11:00 AM 02/19/2016   10:30 AM  MMSE - Mini Mental State Exam  Not completed:   --  Orientation to time 3 5   Orientation  to Place 5 5   Registration 3 3   Attention/ Calculation 5 5   Recall 1 0   Language- name 2 objects 2 2   Language- repeat 1 1   Language- follow 3 step command 3 3   Language- read & follow direction 1 1   Write a sentence 1 1   Copy design 1 1   Total score 26 27         06/28/2023    1:51 PM 05/12/2022   10:15 AM 04/13/2020   11:47 AM 03/11/2019   11:19 AM 12/26/2017    9:17 AM  6CIT Screen  What Year? 0 points 0 points 0 points 0 points 0 points  What month? 0 points 0 points 0 points 0 points 0 points  What time? 0 points 0 points  0 points 0 points  Count back from 20 0 points 0 points 0 points 0 points 0 points  Months in reverse 4 points 4 points 0 points 0 points 0 points  Repeat phrase 2 points 4 points 2 points 0 points   Total Score 6 points 8 points  0 points     Immunizations Immunization History  Administered Date(s) Administered   Fluad Quad(high Dose 65+) 01/08/2019, 01/16/2020, 02/25/2021, 02/04/2022   Influenza Split 01/31/2011, 01/16/2012   Influenza Whole 03/07/2007, 01/31/2008, 01/30/2009   Influenza, High Dose Seasonal PF 01/11/2017, 02/05/2018, 03/15/2023   Influenza,inj,Quad PF,6+ Mos 01/21/2013, 01/27/2014, 12/26/2014   Influenza-Unspecified 01/05/2016   Moderna Covid-19 Fall Seasonal Vaccine 1yrs & older 03/15/2023   PFIZER(Purple Top)SARS-COV-2 Vaccination 05/24/2019, 06/14/2019, 02/02/2020   Pfizer Covid-19 Vaccine Bivalent Booster 37yrs & up 02/15/2021   Pneumococcal Conjugate-13 08/19/2013   Pneumococcal Polysaccharide-23 04/04/2007   Respiratory Syncytial Virus Vaccine,Recomb Aduvanted(Arexvy) 02/18/2022   Td 04/04/2007, 09/14/2017   Varicella 08/19/2013   Zoster Recombinant(Shingrix) 09/14/2017    Screening Tests Health Maintenance  Topic Date Due   FOOT EXAM  02/01/2023   COVID-19 Vaccine (6 - 2024-25 season) 05/10/2023   Zoster Vaccines- Shingrix (2 of 2) 07/10/2023 (Originally 11/09/2017)   HEMOGLOBIN A1C  10/10/2023    OPHTHALMOLOGY EXAM  01/16/2024   Diabetic kidney evaluation - Urine ACR  04/10/2024   Diabetic kidney evaluation - eGFR measurement  05/16/2024   Medicare Annual Wellness (AWV)  06/27/2024   DTaP/Tdap/Td (3 - Tdap) 09/15/2027   Pneumonia Vaccine 24+ Years old  Completed   INFLUENZA VACCINE  Completed   HPV VACCINES  Aged Out   Colonoscopy  Discontinued   Hepatitis C Screening  Discontinued    Health Maintenance  Health Maintenance Due  Topic Date Due   FOOT EXAM  02/01/2023   COVID-19 Vaccine (6 -  2024-25 season) 05/10/2023   Health Maintenance Items Addressed:   Additional Screening:  Vision Screening: Recommended annual ophthalmology exams for early detection of glaucoma and other disorders of the eye.  Dental Screening: Recommended annual dental exams for proper oral hygiene  Community Resource Referral / Chronic Care Management: CRR required this visit?  No   CCM required this visit?  No     Plan:     I have personally reviewed and noted the following in the patient's chart:   Medical and social history Use of alcohol, tobacco or illicit drugs  Current medications and supplements including opioid prescriptions. Patient is not currently taking opioid prescriptions. Functional ability and status Nutritional status Physical activity Advanced directives List of other physicians Hospitalizations, surgeries, and ER visits in previous 12 months Vitals Screenings to include cognitive, depression, and falls Referrals and appointments  In addition, I have reviewed and discussed with patient certain preventive protocols, quality metrics, and best practice recommendations. A written personalized care plan for preventive services as well as general preventive health recommendations were provided to patient.     Marzella Schlein, LPN   10/20/3084   After Visit Summary: (MyChart) Due to this being a telephonic visit, the after visit summary with patients personalized  plan was offered to patient via MyChart   Notes: Nothing significant to report at this time.

## 2023-07-05 ENCOUNTER — Ambulatory Visit: Payer: Medicare Other | Admitting: Family Medicine

## 2023-07-05 ENCOUNTER — Encounter: Payer: Self-pay | Admitting: Family Medicine

## 2023-07-05 VITALS — BP 126/70 | HR 65 | Temp 97.2°F | Ht 72.0 in | Wt 185.0 lb

## 2023-07-05 DIAGNOSIS — E119 Type 2 diabetes mellitus without complications: Secondary | ICD-10-CM

## 2023-07-05 DIAGNOSIS — Z0000 Encounter for general adult medical examination without abnormal findings: Secondary | ICD-10-CM

## 2023-07-05 NOTE — Patient Instructions (Addendum)
 Schedule a lab visit at the check out desk  march 11th or later but within 2 weeks. Return for future fasting labs meaning nothing but water after midnight please. Ok to take your medications with water.   Glad you are doing so well  Consider Ozempic and stopping januvia if a1c over 8- keep up the exercise- so helpful for sugar control and keep watching the diet  Recommended follow up: Return in about 4 months (around 11/04/2023) for followup or sooner if needed.Schedule b4 you leave.

## 2023-07-05 NOTE — Progress Notes (Signed)
 Phone: [phone]   Subjective:  Patient presents today for their annual physical. Chief complaint-noted.   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 All started after a hemorrhoid surgery and later with urinary retention. Patient at one point was septic from urinary issues.     Diabetes mellitus type II, controlled 04/23/2007    Metformin 1g BID, amaryl 4mg --> 8mg , had to add [place] back  Never took victoza.   Januvia-lethargic and dizzy in past. 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 Follows with Dr. 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   Obstructive sleep apnea 04/23/2007   uses CPAP  nightly   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   Pulmonary nodule 02/04/2022   Sepsis secondary to UTI 03/29/2015   Severe sepsis (HCC) 03/31/2023   Patient Active Problem List   Diagnosis Date Noted   Acute pulmonary embolism (HCC) 04/19/2023    Priority: High   Mild neurocognitive disorder, concerns for Alzheimer's disease 12/23/2019    Priority: High   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    Priority: High   BPH (benign prostatic hyperplasia) 07/02/2015    Priority: High   Diabetes mellitus type II, controlled 04/23/2007    Priority: High   History of GI bleed 04/20/2023    Priority: Medium    Anxiety and depression 04/20/2023    Priority: Medium    Pulmonary nodule 02/04/2022    Priority: Medium    History of colon cancer 01/27/2014    Priority: Medium    Hyperlipidemia associated with type 2 diabetes mellitus 04/23/2007    Priority: Medium    Obstructive sleep apnea 04/23/2007    Priority: Medium    Essential hypertension 04/23/2007    Priority: Medium    History of skin cancer     Priority: Low   Allergic rhinitis 08/19/2019    Priority: Low   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    Priority: Low   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    Priority: Low   CAP (community acquired pneumonia) 05/19/2015    Priority: Low   Hallux valgus of right foot 03/26/2014  Priority: Low   Iritis 12/17/2008    Priority: Low   Postherpetic neuralgia 05/22/2008    Priority: Low   Bigeminy 04/21/2008    Priority: Low   Thyroid nodule 03/31/2023   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   Past Surgical History:  Procedure Laterality Date   AMPUTATION TOE Right 09/14/2016   Procedure: RIGHT 2ND TOE/RAY AMPUTATION;  Surgeon: Tarry Kos, MD;  Location: [facility];  Service: Orthopedics;  Laterality: Right;   APPENDECTOMY  05/03/1947   BIOPSY  01/28/2022   Procedure: BIOPSY;  Surgeon: Sherrilyn Rist, MD;  Location: [facility]  ENDOSCOPY;  Service: Gastroenterology;;   COLECTOMY  05/02/1990   CYSTOSCOPY N/A 06/21/2015   Procedure: CYSTOSCOPY, CLOT EVACUATION, AND CAUTERIZATION OF PROSTATE FOSSA;  Surgeon: Jethro Bolus, MD;  Location: [facility]S;  Service: Urology;  Laterality: N/A;   ESOPHAGOGASTRODUODENOSCOPY (EGD) WITH PROPOFOL N/A 01/26/2022   Procedure: ESOPHAGOGASTRODUODENOSCOPY (EGD) WITH PROPOFOL;  Surgeon: Sherrilyn Rist, MD;  Location: [facility] ENDOSCOPY;  Service: Gastroenterology;  Laterality: N/A;   ESOPHAGOGASTRODUODENOSCOPY (EGD) WITH PROPOFOL N/A 01/28/2022   Procedure: ESOPHAGOGASTRODUODENOSCOPY (EGD) WITH PROPOFOL;  Surgeon: Sherrilyn Rist, MD;  Location: [facility] ENDOSCOPY;  Service: Gastroenterology;  Laterality: N/A;   HEMORRHOID SURGERY     HEMOSTASIS CLIP PLACEMENT  01/26/2022   Procedure: HEMOSTASIS CLIP PLACEMENT;  Surgeon: Sherrilyn Rist, MD;  Location: [facility] ENDOSCOPY;  Service: Gastroenterology;;   HEMOSTASIS CONTROL  01/26/2022   Procedure: HEMOSTASIS CONTROL;  Surgeon: Sherrilyn Rist, MD;  Location: [facility] ENDOSCOPY;  Service: Gastroenterology;;   HOT HEMOSTASIS N/A 01/26/2022   Procedure: HOT HEMOSTASIS (ARGON PLASMA COAGULATION/BICAP);  Surgeon: Sherrilyn Rist, MD;  Location: [facility] ENDOSCOPY;  Service: Gastroenterology;  Laterality: N/A;   MOLE REMOVAL  2022   hand   SCLEROTHERAPY  01/26/2022   Procedure: SCLEROTHERAPY;  Surgeon: Sherrilyn Rist, MD;  Location: [facility] ENDOSCOPY;  Service: Gastroenterology;;   TOE SURGERY  04/2020   TRANSURETHRAL RESECTION OF PROSTATE N/A 05/23/2015   Procedure: TRANSURETHRAL RESECTION OF THE PROSTATE (TURP);  Surgeon: Bjorn Pippin, MD;  Location: [facility];  Service: Urology;  Laterality: N/A;    Family History  Problem Relation Age of Onset   Alzheimer's disease Mother    Colon cancer Father 28   Diabetes Father        type I   Colon cancer Paternal Grandmother 75   Rectal cancer Maternal Uncle    Cancer Other        colon/fhx   Esophageal cancer Neg Hx     Stomach cancer Neg Hx     Medications- reviewed and updated Current Outpatient Medications  Medication Sig Dispense Refill   [employer] STOMACH RELIEF 262 MG chewable tablet Chew 2 tablets by mouth 4 (four) times daily.     donepezil (ARICEPT) 10 MG tablet TAKE 1 TABLET BY MOUTH EVERYDAY AT BEDTIME 90 tablet 0   ferrous sulfate 325 (65 FE) MG tablet Take 1 tablet (325 mg total) by mouth 2 (two) times daily with a meal.     glipiZIDE (GLUCOTROL) 10 MG tablet TAKE 1 TABLET (10 MG TOTAL) BY MOUTH TWICE A DAY BEFORE A MEAL 180 tablet 3   Glucagon (GVOKE HYPOPEN 2-PACK) 0.5 MG/0.1ML SOAJ Inject 0.5 mg into the skin daily as needed (for low blood sugar). 0.2 mL 1   JANUVIA 100 MG tablet TAKE 1 TABLET BY MOUTH EVERY DAY 90 tablet 3   metFORMIN (GLUCOPHAGE) 1000 MG tablet TAKE 1 TABLET BY  MOUTH TWICE A DAY WITH A MEAL 180 tablet 3   ONETOUCH ULTRA test strip USE ONCE DAILY AS INSTRUCTED 100 strip 3   rosuvastatin (CRESTOR) 20 MG tablet TAKE 1 TABLET BY MOUTH ONE TIME PER WEEK 13 tablet 3   apixaban (ELIQUIS) 5 MG TABS tablet Take 1 tablet (5 mg total) by mouth 2 (two) times daily. 60 tablet 4   No current facility-administered medications for this visit.    Allergies-reviewed and updated Allergies  Allergen Reactions   Bactrim [Sulfamethoxazole-Trimethoprim] Nausea And Vomiting    Pt has chills and fevers also.    Social History   Social History Narrative   Married 1966. 2 daughters Noreene Larsson divorced lives in [place] works for [employer] no kids and Vikki Ports never married in [place], [place] working for [employer] for [employer] improvement no kids.       Retired 2001-[employer]      Hobbies: hunting, fishing, walking, Geneticist, molecular (600-700 rounds per week)      No religious beliefs affecting health care, no afterlife beliefs      Right Handed      Two Story Home   Objective  Objective:  BP 126/70   Pulse 65   Temp (!) 97.2 F (36.2 C)   Ht 6' (1.829 m)   Wt 185 lb (83.9 kg)    SpO2 95%   BMI 25.09 kg/m  Gen: NAD, resting comfortably HEENT: Mucous membranes are moist. Oropharynx normal Neck: no thyromegaly CV: RRR no murmurs rubs or gallops Lungs: CTAB no crackles, wheeze, rhonchi Abdomen: soft/nontender/nondistended/normal bowel sounds. No rebound or guarding.  Ext: no edema Skin: warm, dry Neuro: grossly normal, moves all extremities, PERRLA  Diabetic Foot Exam - Simple   Simple Foot Form Diabetic Foot exam was performed with the following findings: Yes 07/05/2023  8:50 AM  Visual Inspection See comments: Yes Sensation Testing Intact to touch and monofilament testing bilaterally: Yes Pulse Check Posterior Tibialis and Dorsalis pulse intact bilaterally: Yes Comments Amputation 2nd toe on right foot      Assessment and Plan  83 y.o. male presenting for annual physical.  Health Maintenance counseling: 1. Anticipatory guidance: Patient counseled regarding regular dental exams -q6 months, eye exams - yearly,  avoiding smoking and second hand smoke , limiting alcohol to 2 beverages per day - very rare social intake on alcohol- especially with memory, no illicit drugs .   2. Risk factor reduction:  Advised patient of need for regular exercise and diet rich and fruits and vegetables to reduce risk of heart attack and stroke.  Exercise- doing yard work again, walking again more regularly.  Diet/weight management-down 11 lbs from last physical - very close to ideal goal.  Wt Readings from Last 3 Encounters:  07/05/23 185 lb (83.9 kg)  06/28/23 175 lb (79.4 kg)  05/17/23 175 lb 6.4 oz (79.6 kg)  3. Immunizations/screenings/ancillary studies- Prevnar 20 - hold off for now, otherwise up to date . Prior severe immune response to COVID vaccine and we are opting out long term- profound loss of memory afterwards that later improved Immunization History  Administered Date(s) Administered   Fluad Quad(high Dose 65+) 01/08/2019, 01/16/2020, 02/25/2021, 02/04/2022    Influenza Split 01/31/2011, 01/16/2012   Influenza Whole 03/07/2007, 01/31/2008, 01/30/2009   Influenza, High Dose Seasonal PF 01/11/2017, 02/05/2018, 03/15/2023   Influenza,inj,Quad PF,6+ Mos 01/21/2013, 01/27/2014, 12/26/2014   Influenza-Unspecified 01/05/2016   Moderna Covid-19 Fall Seasonal Vaccine 61yrs & older 03/15/2023   PFIZER(Purple Top)SARS-COV-2 Vaccination 05/24/2019, 06/14/2019, 02/02/2020  Research officer, trade union 34yrs & up 02/15/2021   Pneumococcal Conjugate-13 08/19/2013   Pneumococcal Polysaccharide-23 04/04/2007   Respiratory Syncytial Virus Vaccine,Recomb Aduvanted(Arexvy) 02/18/2022   Td 04/04/2007, 09/14/2017   Varicella 08/19/2013   Zoster Recombinant(Shingrix) 09/14/2017  4. Prostate cancer screening- past age based screening recommendations   - but seeing urology on Friday with hypermetabolism- ? Any relation to TURP related  5. Colon cancer screening -  history of colon cancer but has had excellent follow up for this after colectomy 1992- Dr. Russella Dar said no further colonoscopy initially but later they scheduled but then he became ill and Dr. Russella Dar retired- right now we are holding steady and holding off. Reconsider next year 6. Skin cancer screening- sees dermatology regularly- sees later this month. advised regular sunscreen use. Denies worrisome, changing, or new skin lesions.  7. Smoking associated screening (lung cancer screening, AAA screen 65-75, UA)- former smoker- quit over 40 years ago and no regular screening needed 8. STD screening - only active with wife  Status of chronic or acute concerns   # Pulmonary embolism-diagnosed during December 2024 hospitalization with plans for 6 months of treatment at least through June 2025-potentially related to sedentary activity with pneumonia versus possible provocation by COVID vaccination  # Mild cognitive impairment-follows with Dr. Karel Jarvis S: Medication: Donepezil 10 mg -worsened around 2021 then  again in late 2024 after COVID shot then aspiration pneumonia then PULMONARY EMBOLISM  but has much improved in 2025  A/P: overall stable- thankfully improved after set back with illness last year    # Diabetes-poorly controlled at times  S: Medication:glipizide 10 mg twice daily , januvia 100mg  daily, metformin  1000mg  twice daily  -gvoke available at home- no lows  Lab Results  Component Value Date   HGBA1C 7.1 (H) 04/11/2023   HGBA1C 7.4 (H) 12/27/2022   HGBA1C 7.8 (H) 08/23/2022  A/P: hopefully stable- update a1c on march 11th or later. Continue current meds for now   - likely stay stable as long as #s under 8- consider glp1 like Ozempic and stopping januvia if # over 8   #hypertension S: medication: None -On discharge May 03, 2023-stopped amlodipine 5 mg every day and telmisartan 80 mg -occasionally over 140 at home ut usually 130s BP Readings from Last 3 Encounters:  07/05/23 126/70  05/17/23 138/70  05/03/23 115/75  A/P: blood pressure looks good off of the amlodipine and telmisartan  #hyperlipidemia S: Medication: Rosuvastatin 20 mg once a week Lab Results  Component Value Date   CHOL 117 05/17/2023   HDL 42.70 05/17/2023   LDLCALC 52 05/17/2023   LDLDIRECT 90.0 09/14/2017   TRIG 110.0 05/17/2023   CHOLHDL 3 05/17/2023   A/P: hopefully stable- update lipid panel with upcoming labs. Continue current meds for now   # GERD and history h pylori related ulcer/anemia- no recent issues  #pulmonary nodule 12 mm left lower lobe- no increased metabolism 10/28/22 PET scan- they recommended CT chest in 1 year to show stability- order next visit  #pancreatic cyst- noted back to 2023- stable on PET 6/28/243- CT abdomen recommended 2 years out so 2026  #prostate hypermetabolism- scheduled on Friday for urology visit to discuss prostate and bladder. Dr. Annabell Howells - Bladder wall is thick and-could be related to enlarged prostate potentially   #right thyroid nodule-  -Imaged  April 12, 2023-1.4 cm with annual follow-up for 5 years Lab Results  Component Value Date   TSH 1.984 04/01/2023   Recommended follow up: Return  in about 4 months (around 11/04/2023) for followup or sooner if needed.Schedule b4 you leave. Future Appointments  Date Time Provider Department Center  07/06/2023 11:30 AM [name] D, LCSW [facility] None  09/01/2023 11:15 AM Vivi Barrack, DPM [facility]  09/29/2023 11:30 AM Marcos Eke, PA-C [facility] None  10/31/2023  1:00 PM [name], PhD [facility] None  10/31/2023  2:00 PM [facility] None  11/08/2023  2:30 PM [name], PhD [facility] None   Lab/Order associations:NOT fasting- will come back fasting   ICD-10-CM   1. Preventative health care  Z00.00     2. Controlled type 2 diabetes mellitus without complication, without long-term current use of insulin (HCC)  E11.9 Comprehensive metabolic panel    CBC with Differential/Platelet    Lipid panel    Hemoglobin A1c      No orders of the defined types were placed in this encounter.   Return precautions advised.  Tana Conch, MD

## 2023-07-06 ENCOUNTER — Ambulatory Visit: Payer: Self-pay | Admitting: Licensed Clinical Social Worker

## 2023-07-06 NOTE — Patient Instructions (Signed)
 Visit Information  Thank you for taking time to visit with me today. Please don't hesitate to contact me if I can be of assistance to you.   Following are the goals we discussed today:   Goals Addressed             This Visit's Progress    COMPLETED: Obtain Supportive Resources-Caregiver Stress   On track    Activities and task to complete in order to accomplish goals.   Keep all upcoming appointments discussed today Continue with compliance of taking medication prescribed by Doctor Implement healthy coping skills discussed to assist with management of symptoms Talk with family about connected car system devices to strengthen support and decrease anxiety about pt driving independently          Please call the care guide team at [phone] if you need to cancel or reschedule your appointment.   If you are experiencing a Mental Health or Behavioral Health Crisis or need someone to talk to, please call the Suicide and Crisis Lifeline: 988 call 911   Patient verbalizes understanding of instructions and care plan provided today and agrees to view in MyChart. Active MyChart status and patient understanding of how to access instructions and care plan via MyChart confirmed with patient.     No further follow up required: Family agreed to contact PCP if any needs arise  [name], LCSW [facility]  [facility], [facility] Clinical Social Worker Direct Dial: [phone]  Fax: [phone] Website: [email or web address] 12:05 PM

## 2023-07-06 NOTE — Patient Outreach (Signed)
 Care Coordination   Follow Up Visit Note   07/06/2023 Name: Walter [name] MRN: [id number] DOB: 1940/06/16  Walter [name] is a 83 y.o. year old male who sees Hunter, Aldine Contes, MD for primary care. I spoke with  Walter [name] spouse by phone today.  What matters to the patients health and wellness today?  Symptom Management    Goals Addressed             This Visit's Progress    COMPLETED: Obtain Supportive Resources-Caregiver Stress   On track    Activities and task to complete in order to accomplish goals.   Keep all upcoming appointments discussed today Continue with compliance of taking medication prescribed by Doctor Implement healthy coping skills discussed to assist with management of symptoms Talk with family about connected car system devices to strengthen support and decrease anxiety about pt driving independently          SDOH assessments and interventions completed:  No     Care Coordination Interventions:  Yes, provided  Interventions Today    Flowsheet Row Most Recent Value  Chronic Disease   Chronic disease during today's visit Diabetes, Other  [Anxiety and Depression]  General Interventions   General Interventions Discussed/Reviewed General Interventions Reviewed, [facility], Doctor Visits, Level of Care  Doctor Visits Discussed/Reviewed Doctor Visits Reviewed  Level of Care --  [Pt has completed PT, continues to receive ST for the next couple of weeks]  Mental Health Interventions   Mental Health Discussed/Reviewed Mental Health Reviewed, Coping Strategies, Anxiety  Nutrition Interventions   Nutrition Discussed/Reviewed Nutrition Reviewed  Pharmacy Interventions   Pharmacy Dicussed/Reviewed Pharmacy Topics Reviewed, Medication Adherence  Safety Interventions   Safety Discussed/Reviewed Safety Reviewed       Follow up plan: No further intervention required.   Encounter Outcome:  Patient Visit Completed   [name], LCSW [facility]   [facility], [facility] Clinical Social Worker Direct Dial: [phone]  Fax: [phone] Website: [email or web address] 12:05 PM

## 2023-07-11 ENCOUNTER — Encounter: Payer: Self-pay | Admitting: Family Medicine

## 2023-07-11 ENCOUNTER — Other Ambulatory Visit (INDEPENDENT_AMBULATORY_CARE_PROVIDER_SITE_OTHER)

## 2023-07-11 DIAGNOSIS — E119 Type 2 diabetes mellitus without complications: Secondary | ICD-10-CM | POA: Diagnosis not present

## 2023-07-11 LAB — COMPREHENSIVE METABOLIC PANEL
ALT: 22 U/L (ref 0–53)
AST: 16 U/L (ref 0–37)
Albumin: 4.6 g/dL (ref 3.5–5.2)
Alkaline Phosphatase: 61 U/L (ref 39–117)
BUN: 15 mg/dL (ref 6–23)
CO2: 30 meq/L (ref 19–32)
Calcium: 9.6 mg/dL (ref 8.4–10.5)
Chloride: 102 meq/L (ref 96–112)
Creatinine, Ser: 0.88 mg/dL (ref 0.40–1.50)
GFR: 80.01 mL/min (ref >60.00)
Glucose, Bld: 148 mg/dL — ABNORMAL HIGH (ref 70–99)
Potassium: 3.9 meq/L (ref 3.5–5.1)
Sodium: 140 meq/L (ref 135–145)
Total Bilirubin: 0.5 mg/dL (ref 0.2–1.2)
Total Protein: 7.4 g/dL (ref 6.0–8.3)

## 2023-07-11 LAB — CBC WITH DIFFERENTIAL/PLATELET
Basophils Absolute: 0 10*3/uL (ref 0.0–0.1)
Basophils Relative: 0.6 % (ref 0.0–3.0)
Eosinophils Absolute: 0.2 10*3/uL (ref 0.0–0.7)
Eosinophils Relative: 2.9 % (ref 0.0–5.0)
HCT: 43.5 % (ref 39.0–52.0)
Hemoglobin: 14.4 g/dL (ref 13.0–17.0)
Lymphocytes Relative: 18.1 % (ref 12.0–46.0)
Lymphs Abs: 1.2 10*3/uL (ref 0.7–4.0)
MCHC: 33.1 g/dL (ref 30.0–36.0)
MCV: 95 fl (ref 78.0–100.0)
Monocytes Absolute: 0.6 10*3/uL (ref 0.1–1.0)
Monocytes Relative: 8.1 % (ref 3.0–12.0)
Neutro Abs: 4.8 10*3/uL (ref 1.4–7.7)
Neutrophils Relative %: 70.3 % (ref 43.0–77.0)
Platelets: 205 10*3/uL (ref 150.0–400.0)
RBC: 4.57 Mil/uL (ref 4.22–5.81)
RDW: 14.9 % (ref 11.5–15.5)
WBC: 6.8 10*3/uL (ref 4.0–10.5)

## 2023-07-11 LAB — LIPID PANEL
Cholesterol: 165 mg/dL (ref 0–200)
HDL: 51.1 mg/dL (ref >39.00)
LDL Cholesterol: 91 mg/dL (ref 0–99)
NonHDL: 113.73
Total CHOL/HDL Ratio: 3
Triglycerides: 115 mg/dL (ref 0.0–149.0)
VLDL: 23 mg/dL (ref 0.0–40.0)

## 2023-07-11 LAB — HEMOGLOBIN A1C: Hgb A1c MFr Bld: 7.1 % — ABNORMAL HIGH (ref 4.6–6.5)

## 2023-07-25 ENCOUNTER — Other Ambulatory Visit: Payer: Self-pay | Admitting: Family Medicine

## 2023-08-23 ENCOUNTER — Other Ambulatory Visit: Payer: Self-pay | Admitting: Family Medicine

## 2023-09-01 ENCOUNTER — Ambulatory Visit: Payer: Medicare Other | Admitting: Podiatry

## 2023-09-18 ENCOUNTER — Ambulatory Visit: Admitting: Podiatry

## 2023-09-18 DIAGNOSIS — M79674 Pain in right toe(s): Secondary | ICD-10-CM

## 2023-09-18 DIAGNOSIS — B351 Tinea unguium: Secondary | ICD-10-CM | POA: Diagnosis not present

## 2023-09-18 DIAGNOSIS — Z7901 Long term (current) use of anticoagulants: Secondary | ICD-10-CM

## 2023-09-18 DIAGNOSIS — M79675 Pain in left toe(s): Secondary | ICD-10-CM

## 2023-09-18 NOTE — Progress Notes (Signed)
 Subjective: Chief Complaint  Patient presents with   Nail Problem    RM 12 Pt is here for routine foot care.     83 year old male presents the office today with above concerns.  States the nails are thickened elongated he cannot trim them himself and causing discomfort.    Last A1c was 7.1 in July 11, 2023  Almira Jaeger, MD Last seen July 05, 2023  On Eliquis    Objective: AAO x3, NAD DP/PT pulses palpable bilaterally, CRT less than 3 seconds Nails are hypertrophic, dystrophic, brittle, discolored, elongated 9. No surrounding redness or drainage. 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  No pain with calf compression, swelling, warmth, erythema  Assessment: 83 year old male with symptomatic onychomycosis; preulcerative callus  Plan: -All treatment options discussed with the patient including all alternatives, risks, complications.  -Sharply debrided the nails x 9 without any complications or bleeding. -Patient encouraged to call the office with any questions, concerns, change in symptoms.   Return in about 3 months (around 12/19/2023) for nail trim .   Charity Conch DPM

## 2023-09-29 ENCOUNTER — Encounter: Payer: Self-pay | Admitting: Physician Assistant

## 2023-09-29 ENCOUNTER — Ambulatory Visit: Payer: Medicare Other | Admitting: Physician Assistant

## 2023-09-29 VITALS — BP 144/78 | HR 79 | Resp 20 | Wt 190.0 lb

## 2023-09-29 DIAGNOSIS — F0670 Mild neurocognitive disorder due to known physiological condition without behavioral disturbance: Secondary | ICD-10-CM

## 2023-09-29 DIAGNOSIS — G309 Alzheimer's disease, unspecified: Secondary | ICD-10-CM

## 2023-09-29 MED ORDER — DONEPEZIL HCL 10 MG PO TABS: 10.0000 mg | ORAL_TABLET | Freq: Every day | ORAL | 3 refills | Status: AC

## 2023-09-29 NOTE — Patient Instructions (Addendum)
 Good to see you.  Continue Donepezil  to 10mg  daily. 1 tablet daily Recommend psychotherapy for depression and anxiety    Repeat neuropsych evaluation in 10/2023  Follow-up in 6 months, call for any changes   RECOMMENDATIONS FOR ALL PATIENTS WITH MEMORY PROBLEMS: 1. Continue to exercise (Recommend 30 minutes of walking everyday, or 3 hours every week) 2. Increase social interactions - continue going to [place] and enjoy social gatherings with friends and family 3. Eat healthy, avoid fried foods and eat more fruits and vegetables 4. Maintain adequate blood pressure, blood sugar, and blood cholesterol level. Reducing the risk of stroke and cardiovascular disease also helps promoting better memory. 5. Avoid stressful situations. Live a simple life and avoid aggravations. Organize your time and prepare for the next day in anticipation. 6. Sleep well, avoid any interruptions of sleep and avoid any distractions in the bedroom that may interfere with adequate sleep quality 7. Avoid sugar, avoid sweets as there is a strong link between excessive sugar intake, diabetes, and cognitive impairment We discussed the Mediterranean diet, which has been shown to help patients reduce the risk of progressive memory disorders and reduces cardiovascular risk. This includes eating fish, eat fruits and green leafy vegetables, nuts like almonds and hazelnuts, walnuts, and also use olive oil. Avoid fast foods and fried foods as much as possible. Avoid sweets and sugar as sugar use has been linked to worsening of memory function.  There is always a concern of gradual progression of memory problems. If this is the case, then we may need to adjust level of care according to patient needs. Support, both to the patient and caregiver, should then be put into place.      Mediterranean Diet  Why follow it? Research shows. Those who follow the Mediterranean diet have a reduced risk of heart disease  The diet is associated with a  reduced incidence of Parkinson's and Alzheimer's diseases People following the diet may have longer life expectancies and lower rates of chronic diseases  The Dietary Guidelines for Americans recommends the Mediterranean diet as an eating plan to promote health and prevent disease  What Is the Mediterranean Diet?  Healthy eating plan based on typical foods and recipes of Mediterranean-style cooking The diet is primarily a plant based diet; these foods should make up a majority of meals   Starches - Plant based foods should make up a majority of meals - They are an important sources of vitamins, minerals, energy, antioxidants, and fiber - Choose whole grains, foods high in fiber and minimally processed items  - Typical grain sources include wheat, oats, barley, corn, brown rice, bulgar, farro, millet, polenta, couscous  - Various types of beans include chickpeas, lentils, fava beans, black beans, white beans   Fruits  Veggies - Large quantities of antioxidant rich fruits & veggies; 6 or more servings  - Vegetables can be eaten raw or lightly drizzled with oil and cooked  - Vegetables common to the traditional Mediterranean Diet include: artichokes, arugula, beets, broccoli, brussel sprouts, cabbage, carrots, celery, collard greens, cucumbers, eggplant, kale, leeks, lemons, lettuce, mushrooms, okra, onions, peas, peppers, potatoes, pumpkin, radishes, rutabaga, shallots, spinach, sweet potatoes, turnips, zucchini - Fruits common to the Mediterranean Diet include: apples, apricots, avocados, cherries, clementines, dates, figs, grapefruits, grapes, melons, nectarines, oranges, peaches, pears, pomegranates, strawberries, tangerines  Fats - Replace butter and margarine with healthy oils, such as olive oil, canola oil, and tahini  - Limit nuts to no more than a handful a day  -  Nuts include walnuts, almonds, pecans, pistachios, pine nuts  - Limit or avoid candied, honey roasted or heavily salted nuts -  Olives are central to the Mediterranean diet - can be eaten whole or used in a variety of dishes   Meats Protein - Limiting red meat: no more than a few times a month - When eating red meat: choose lean cuts and keep the portion to the size of deck of cards - Eggs: approx. 0 to 4 times a week  - Fish and lean poultry: at least 2 a week  - Healthy protein sources include, chicken, [place], lean beef, lamb - Increase intake of seafood such as tuna, salmon, trout, mackerel, shrimp, scallops - Avoid or limit high fat processed meats such as sausage and bacon  Dairy - Include moderate amounts of low fat dairy products  - Focus on healthy dairy such as fat free yogurt, skim milk, low or reduced fat cheese - Limit dairy products higher in fat such as whole or 2% milk, cheese, ice cream  Alcohol  - Moderate amounts of red wine is ok  - No more than 5 oz daily for women (all ages) and men older than age 64  - No more than 10 oz of wine daily for men younger than 66  Other - Limit sweets and other desserts  - Use herbs and spices instead of salt to flavor foods  - Herbs and spices common to the traditional Mediterranean Diet include: basil, bay leaves, chives, cloves, cumin, fennel, garlic, lavender, marjoram, mint, oregano, parsley, pepper, rosemary, sage, savory, sumac, tarragon, thyme   It's not just a diet, it's a lifestyle:  The Mediterranean diet includes lifestyle factors typical of those in the region  Foods, drinks and meals are best eaten with others and savored Daily physical activity is important for overall good health This could be strenuous exercise like running and aerobics This could also be more leisurely activities such as walking, housework, yard-work, or taking the stairs Moderation is the key; a balanced and healthy diet accommodates most foods and drinks Consider portion sizes and frequency of consumption of certain foods   Meal Ideas & Options:  Breakfast:  Whole wheat toast  or whole wheat English muffins with peanut butter & hard boiled egg Steel cut oats topped with apples & cinnamon and skim milk  Fresh fruit: banana, strawberries, melon, berries, peaches  Smoothies: strawberries, bananas, greek yogurt, peanut butter Low fat greek yogurt with blueberries and granola  Egg white omelet with spinach and mushrooms Breakfast couscous: whole wheat couscous, apricots, skim milk, cranberries  Sandwiches:  Hummus and grilled vegetables (peppers, zucchini, squash) on whole wheat bread   Grilled chicken on whole wheat pita with lettuce, tomatoes, cucumbers or tzatziki  [place] salad on whole wheat bread: tuna salad made with greek yogurt, olives, red peppers, capers, green onions Garlic rosemary lamb pita: lamb sauted with garlic, rosemary, salt & pepper; add lettuce, cucumber, greek yogurt to pita - flavor with lemon juice and black pepper  Seafood:  Mediterranean grilled salmon, seasoned with garlic, basil, parsley, lemon juice and black pepper Shrimp, lemon, and spinach whole-grain pasta salad made with low fat greek yogurt  Seared scallops with lemon orzo  Seared tuna steaks seasoned salt, pepper, coriander topped with tomato mixture of olives, tomatoes, olive oil, minced garlic, parsley, green onions and cappers  Meats:  Herbed greek chicken salad with kalamata olives, cucumber, feta  Red bell peppers stuffed with spinach, bulgur, lean ground beef (or  lentils) & topped with feta   Kebabs: skewers of chicken, tomatoes, onions, zucchini, squash  [place] burgers: made with red onions, mint, dill, lemon juice, feta cheese topped with roasted red peppers Vegetarian Cucumber salad: cucumbers, artichoke hearts, celery, red onion, feta cheese, tossed in olive oil & lemon juice  Hummus and whole grain pita points with a greek salad (lettuce, tomato, feta, olives, cucumbers, red onion) Lentil soup with celery, carrots made with vegetable broth, garlic, salt and pepper  Tabouli  salad: parsley, bulgur, mint, scallions, cucumbers, tomato, radishes, lemon juice, olive oil, salt and pepper.

## 2023-09-29 NOTE — Progress Notes (Signed)
 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 RH male with a history of hypertension, hyperlipidemia, diabetes, sleep apnea on CPAP, colon cancer s/p resection, prior history of H. pylori, history of acute metabolic encephalopathy and November 2024 after COVID and flu with readmission in December 2020 for aspiration pneumonia, acute PE in the [facility] and [facility] now on Eliquis , and a history of mild cognitive impairment, with etiology still concerning for Alzheimer's disease with vascular contribution per neuropsych evaluation 12/2022  presenting today in follow-up for evaluation of memory loss. Patient is on donepezil  10 mg daily, tolerating well..  Memory is stable, able to participate on his ADLs without difficulty.  Mood will be addressed by his PCP, he may benefit from psychotherapy and psychiatry given his history of PTSD, anxiety and depression.  We also discussed the role of hearing aids in improving comprehension, although the patient declines to use them.     Recommendations:   Follow up in 6  months. Continue donepezil  10 mg daily, side effects discussed Patient is scheduled for neuropsych evaluation in the near future for diagnostic disease trajectory Recommend psychotherapy for PTSD, anxiety and depression.  Recommend psychotherapy and or psychiatry.  He is on Lexapro, Seroquel . Recommend checking hearing to improve comprehension. Recommend visual examination for reading glasses Continue CPAP for OSA Recommend good control of cardiovascular risk factors    Subjective:   This patient is accompanied in the office by his wife  who supplements the history. Previous records as well as any outside records available were reviewed prior to todays visit.   Patient was last seen on 04/07/2023 last MMSE 26/30.    Any changes in memory since last visit? "I think it is better".  He continues to have  difficulty with speech and finding words, significantly.  He recently did speech therapy.  Likes to do reading, play "triominos" on the computer. He likes to do crossword puzzles and watch TV. Repeats oneself?  Endorsed, "better than before ". Disoriented when walking into a room?  Patient denies    Misplacing objects?  Patient denies   Wandering behavior?   Denies. Any personality changes since last visit?  As before, he has high degree of frustration when he cannot remember. Any worsening depression?: denies.   Hallucinations or paranoia? Denies.   Seizures?   Denies.    Any sleep changes? Sleeps well.   Denies vivid dreams, REM behavior or sleepwalking   Sleep apnea?  Endorsed, uses the CPAP nightly follows pulmonology   Any hygiene concerns? Sometimes he needs reminder  Independent of bathing and dressing?  Endorsed  Does the patient needs help with medications?  Wife is in charge   Who is in charge of the finances? Wife is in charge     Any changes in appetite?  Denies, gained 13 lbs since his last hospitalizations    Patient have trouble swallowing?  Denies.   Does the patient cook?  No    Any headaches?    Denies.   Vision changes? Denies. Chronic pain?  Denies.   Ambulates with difficulty?   He has a history of right second toe amputation, which may cause some gait instability at times, he is prone to mechanical falls.   Recent falls or head injuries? Denies.      Unilateral weakness, numbness or tingling?  Denies.   Any tremors?  Chronic, bilateral, mild and intermittent, likely  medication induced.   Any anosmia?    Denies.   Any incontinence of urine?  Denies.   Any bowel dysfunction?  constipation, it is better  Patient lives with his wife.  Does the patient drive?  No longer drives    Neuropsych evaluation 12/2022  Briefly, results suggested prominent impairment surrounding executive functioning, semantic fluency, confrontation naming, and all aspects of learning and  memory. Performance variability was further noted across processing speed and phonemic fluency. Relative to his previous evaluation in August 2022, progressive decline was exhibited across executive functioning, both semantic and phonemic fluency, confrontation naming, and essentially all aspects of learning and memory. This appeared most pronounced surrounding aspects of expressive language. Regarding etiology, I continue to have concern surrounding the presence of underlying Alzheimer's disease. This is based not only on the overall pattern of current deficits, but also given that progressive decline involving the exact areas exhibited by [name] across the current evaluation follows the expected pattern of progression in this illness. Decline appears relatively mild which could suggest the continued slowed progression of this illness. Expressive language decline does appear more mild to moderate in speed.      History on Initial Assessment 11/25/2019: This is a 83 year old right-handed man with a history of hypertension, hyperlipidemia, diabetes, sleep apnea on CPAP, colon cancer s/p resection, presenting for evaluation of memory loss/word-finding difficulties. He reported symptoms started 2 years ago, MMSE 25/30 in PCP office last 08/2019. He and his wife started noticing more significant changes in the past 1 and 1/2 years. He endorses word-finding difficulties, he tries to tell something and the important part slips by him. He states it is not constant, however his wife says it happens a lot. He is searching for words and thoughts, unable to remember names of things or restaurants. He is forgetting how to do things sometimes, this is not often, but one time he forgot how to put his car in park (had to push a button). He has had this car for 2 years. He repeats stories. He cannot focus when watching TV and she is speaking to him, unable to multitask. He says "huh" or "what did you say" a lot, and she does  not think it is his hearing. He lives with his wife. He denies getting lost driving, no missed ills or medications. He denies misplacing things. His wife has also noticed personality changes. He has a short temper worse than before and gets very nasty. When he cannot think of a word, it makes him nervous and uptight. He gets angry and upset when criticized or when he does not agree. No paranoia or hallucinations, no prior psychiatric history. He states his mood is good. His mother had dementia. No history of significant head injuries. No alcohol  use.   He had constipation which resolved with a stool softener. He denies any headaches, dizziness, diplopia, dysarthria/dysphagia, neck/back pain, focal numbness/tingling/weakness, bladder dysfunction, anosmia, or tremors. He sleeps like a baby with his CPAP machine. 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 All started after a  hemorrhoid surgery and later with urinary retention. Patient at one point was septic from urinary issues.     Diabetes mellitus type II, controlled 04/23/2007    Metformin  1g BID, amaryl  4mg --> 8mg , had to add januvia  back  Never took victoza .   Januvia -lethargic and dizzy in past. 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 Follows with Dr. 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   Pulmonary nodule 02/04/2022   Sepsis secondary to UTI 03/29/2015   Severe sepsis (HCC) 03/31/2023     Past Surgical History:  Procedure Laterality Date   AMPUTATION TOE Right 09/14/2016   Procedure: RIGHT 2ND TOE/RAY AMPUTATION;  Surgeon: Wes Hamman, MD;  Location: [facility];  Service: Orthopedics;  Laterality: Right;   APPENDECTOMY  05/03/1947   BIOPSY  01/28/2022   Procedure: BIOPSY;  Surgeon: Albertina Hugger, MD;  Location: [facility] ENDOSCOPY;  Service: Gastroenterology;;   COLECTOMY  05/02/1990   CYSTOSCOPY N/A 06/21/2015   Procedure: CYSTOSCOPY, CLOT EVACUATION, AND CAUTERIZATION OF PROSTATE FOSSA;  Surgeon: Annamarie Kid, MD;  Location: [facility];  Service: Urology;  Laterality: N/A;   ESOPHAGOGASTRODUODENOSCOPY (EGD) WITH PROPOFOL  N/A 01/26/2022   Procedure: ESOPHAGOGASTRODUODENOSCOPY (EGD) WITH PROPOFOL ;  Surgeon: Albertina Hugger, MD;  Location: [facility] ENDOSCOPY;  Service: Gastroenterology;  Laterality: N/A;   ESOPHAGOGASTRODUODENOSCOPY (EGD) WITH PROPOFOL  N/A 01/28/2022   Procedure: ESOPHAGOGASTRODUODENOSCOPY (EGD) WITH PROPOFOL ;  Surgeon: Albertina Hugger, MD;  Location: [facility] END[facility]COPY;  Service: Gastroenterology;  Laterality: N/A;   HEMORRHOID SURGERY     HEMOSTASIS CLIP PLACEMENT  01/26/2022   Procedure: HEMOSTASIS CLIP PLACEMENT;  Surgeon: Albertina Hugger,  MD;  Location: [facility] ENDOSCOPY;  Service: Gastroenterology;;   HEMOSTASIS CONTROL  01/26/2022   Procedure: HEMOSTASIS CONTROL;  Surgeon: Albertina Hugger, MD;  Location: [facility] ENDOSCOPY;  Service: Gastroenterology;;   HOT HEMOSTASIS N/A 01/26/2022   Procedure: HOT HEMOSTASIS (ARGON PLASMA COAGULATION/BICAP);  Surgeon: Albertina Hugger, MD;  Location: [facility] ENDOSCOPY;  Service: Gastroenterology;  Laterality: N/A;   MOLE REMOVAL  2022   hand   SCLEROTHERAPY  01/26/2022   Procedure: SCLEROTHERAPY;  Surgeon: Albertina Hugger, MD;  Location: [facility] ENDOSCOPY;  Service: Gastroenterology;;   TOE SURGERY  04/2020   TRANSURETHRAL RESECTION OF PROSTATE N/A 05/23/2015   Procedure: TRANSURETHRAL RESECTION OF THE PROSTATE (TURP);  Surgeon: Homero Luster, MD;  Location: [facility];  Service: Urology;  Laterality: N/A;     PREVIOUS MEDICATIONS:   CURRENT MEDICATIONS:  Outpatient Encounter Medications as of 09/29/2023  Medication Sig   apixaban  (  ELIQUIS ) 5 MG TABS tablet Take 1 tablet (5 mg total) by mouth 2 (two) times daily.   [employer] STOMACH RELIEF 262 MG chewable tablet Chew 2 tablets by mouth 4 (four) times daily.   donepezil  (ARICEPT ) 10 MG tablet Take 1 tablet (10 mg total) by mouth at bedtime.   ferrous sulfate  325 (65 FE) MG tablet Take 1 tablet (325 mg total) by mouth 2 (two) times daily with a meal.   glipiZIDE  (GLUCOTROL ) 10 MG tablet TAKE 1 TABLET BY MOUTH TWICE A DAY BEFORE A MEAL   Glucagon  (GVOKE HYPOPEN  2-PACK) 0.5 MG/0.1ML SOAJ Inject 0.5 mg into the skin daily as needed (for low blood sugar).   JANUVIA  100 MG tablet TAKE 1 TABLET BY MOUTH EVERY DAY   metFORMIN  (GLUCOPHAGE ) 1000 MG tablet TAKE 1 TABLET BY MOUTH TWICE A DAY WITH A MEAL   ONETOUCH ULTRA test strip USE ONCE DAILY AS INSTRUCTED   rosuvastatin  (CRESTOR ) 20 MG tablet TAKE 1 TABLET BY MOUTH ONE TIME PER WEEK   [DISCONTINUED] donepezil  (ARICEPT ) 10 MG tablet TAKE 1 TABLET BY MOUTH EVERYDAY AT BEDTIME   No facility-administered encounter  medications on file as of 09/29/2023.     Objective:     PHYSICAL EXAMINATION:    VITALS:  There were no vitals filed for this visit.  GEN:  The patient appears stated age and is in NAD. HEENT:  Normocephalic, atraumatic.   Neurological examination:  General: NAD, well-groomed, appears stated age. Orientation: The patient is alert. Oriented to person, place and not to date.  Cranial nerves: There is good facial symmetry.very anxious appearing.  The speech is not very fluent but clear No aphasia or dysarthria. Fund of knowledge is appropriate. Recent memory impaired and remote memory is normal.  Attention and concentration are normal.  Able to name objects and repeat phrases.  Hearing is decreased to conversational tone.   Sensation: Sensation is intact to light touch throughout.  He is status post right second toe amputation Motor: Strength is at least antigravity x4. DTR's 2/4 in UE/LE       No data to display             09/20/2022    5:00 PM 09/02/2021   11:00 AM 02/19/2016   10:30 AM  MMSE - Mini Mental State Exam  Not completed:   --  Orientation to time 3 5   Orientation to Place 5 5   Registration 3 3   Attention/ Calculation 5 5   Recall 1 0   Language- name 2 objects 2 2   Language- repeat 1 1   Language- follow 3 step command 3 3   Language- read & follow direction 1 1   Write a sentence 1 1   Copy design 1 1   Total score 26 27        Movement examination: Tone: There is normal tone in the UE/LE Abnormal movements:  no tremor.  No myoclonus.  No asterixis.   Coordination:  There is no decremation with RAM's. Normal finger to nose  Gait and Station: The patient has no difficulty arising out of a deep-seated chair without the use of the hands. The patient's stride length is short.  Gait is cautious and narrow.   Thank you for allowing us  the opportunity to participate in the care of this nice patient. Please do not hesitate to contact us  for any questions  or concerns.   Total time spent on today's visit was 32  minutes dedicated to this patient today, preparing to see patient, examining the patient, ordering tests and/or medications and counseling the patient, documenting clinical information in the EHR or other health record, independently interpreting results and communicating results to the patient/family, discussing treatment and goals, answering patient's questions and coordinating care.  Cc:  Almira Jaeger, MD  Tex Filbert 09/29/2023 12:42 PM

## 2023-10-24 ENCOUNTER — Other Ambulatory Visit: Payer: Self-pay | Admitting: Family Medicine

## 2023-10-31 ENCOUNTER — Ambulatory Visit: Payer: Medicare Other | Admitting: Psychology

## 2023-10-31 ENCOUNTER — Ambulatory Visit: Payer: Self-pay | Admitting: Psychology

## 2023-10-31 ENCOUNTER — Encounter: Payer: Self-pay | Admitting: Psychology

## 2023-10-31 DIAGNOSIS — G309 Alzheimer's disease, unspecified: Secondary | ICD-10-CM | POA: Diagnosis not present

## 2023-10-31 DIAGNOSIS — F0280 Dementia in other diseases classified elsewhere without behavioral disturbance: Secondary | ICD-10-CM

## 2023-10-31 DIAGNOSIS — D225 Melanocytic nevi of trunk: Secondary | ICD-10-CM | POA: Insufficient documentation

## 2023-10-31 DIAGNOSIS — R4189 Other symptoms and signs involving cognitive functions and awareness: Secondary | ICD-10-CM

## 2023-10-31 DIAGNOSIS — D1801 Hemangioma of skin and subcutaneous tissue: Secondary | ICD-10-CM | POA: Insufficient documentation

## 2023-10-31 HISTORY — DX: Dementia in other diseases classified elsewhere, unspecified severity, without behavioral disturbance, psychotic disturbance, mood disturbance, and anxiety: F02.80

## 2023-10-31 NOTE — Progress Notes (Signed)
   Psychometrician Note   Cognitive testing was administered to Walter [name] by Lonell Jude, B.S. (psychometrist) under the supervision of Dr. Arthea KYM Maryland, Ph.D., ABPP, licensed psychologist on 10/31/2023. Walter [name] did not appear overtly distressed by the testing session per behavioral observation or responses across self-report questionnaires. Rest breaks were offered.    The battery of tests administered was selected by Dr. Zachary C. Merz, Ph.D., ABPP with consideration to Walter [name]'s current level of functioning, the nature of his symptoms, emotional and behavioral responses during interview, level of literacy, observed level of motivation/effort, and the nature of the referral question. This battery was communicated to the psychometrist. Communication between Dr. Arthea KYM Maryland, Ph.D., ABPP and the psychometrist was ongoing throughout the evaluation and Dr. Arthea KYM Maryland, Ph.D., ABPP was immediately accessible at all times. Dr. Zachary C. Merz, Ph.D., ABPP provided supervision to the psychometrist on the date of this service to the extent necessary to assure the quality of all services provided.    Walter [name] will return within approximately 1-2 weeks for an interactive feedback session with Dr. Maryland at which time his test performances, clinical impressions, and treatment recommendations will be reviewed in detail. Walter [name] understands he can contact our office should he require our assistance before this time.  A total of 135 minutes of billable time were spent face-to-face with Walter [name] by the psychometrist. This includes both test administration and scoring time. Billing for these services is reflected in the clinical report generated by Dr. Arthea KYM Maryland, Ph.D., ABPP  This note reflects time spent with the psychometrician and does not include test scores or any clinical interpretations made by Dr. Maryland. The full report will follow in a separate note.

## 2023-10-31 NOTE — Progress Notes (Unsigned)
 NEUROPSYCHOLOGICAL EVALUATION [place]  Department of Neurology  Date of Evaluation: October 31, 2023  Reason for Referral:   Wilber Fini is a 83 y.o. right-handed Caucasian male referred by Camie Sevin, PA-C, to characterize his current cognitive functioning and assist with diagnostic clarity and treatment planning in the context of a previously diagnosed mild neurocognitive disorder, concerns for underlying Alzheimer's disease, and concerns for progressive cognitive decline.   Assessment and Plan:   Clinical Impression(s): Mr. Marczak pattern of performance is suggestive of severe impairment surrounding cognitive flexibility, receptive language, semantic fluency, confrontation naming, clock drawing, and both encoding and retrieval aspects of verbal learning and memory. Additional weakness/impairment was exhibited across complex attention and phonemic fluency. Processing speed was variable. Performances were adequate relative to age-matched peers across basic attention, non-clock drawing visuospatial abilities, retrieval aspects of visual memory, and recognition/consolidation aspects of memory. Functionally, Mr. Fitchett denied concerns. His wife provides occasional reminders surrounding medication management and he has been advised to stop driving by his PCP. Given the severity of ongoing cognitive impairment and the likelihood that this is interfering with day-to-day functioning at least to some degree, Mr. 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  Relative to his neuropsychological evaluation in August 2022 (selected for greater test consistency), quite noteworthy decline has occurred across numerous cognitive domains. Decline is most substantial across expressive and receptive language, as well as both encoding (i.e., learning) and relayed retrieval aspects of memory. Decline is also noteworthy across  processing speed, attention/concentration, and executive functioning. Relative to his most recent evaluation in June 2024, significant decline can again be seen across expressive and receptive language, as well as attention/concentration and executive functioning. Other domains exhibited relative stability over the past year.   The likelihood of an underlying neurodegenerative illness remains quite high given evidence for progressive decline since 2021. Consideration should be given to the language variant of Alzheimer's disease (sometimes known as the logopenic variant or a logopenic primary progressive aphasia presentation). Historically, word finding and trouble with expressive language has been Mr. Lorimer's most salient concern. Expressive language has represented the most significant area of impairment (even predating prominent memory impairment) and greatest area of decline over time, which would align with this presentation. Given that this illness shares underlying pathology with typically presenting Alzheimer's disease, prominent memory impairment would also align with this presentation. His age is somewhat advanced relative to when this presentation typically presents. However, this would not rule out this as the underlying cause.  Also on his differential should remain Alzheimer's disease itself. Retention rates were very poor across memory testing and he has exhibited progressive decline over the past several years. Deficits in expressive language, clock drawing, and cognitive flexibility would follow the expected progression of this illness as it spreads. Curiously, he did show some benefit from cueing across current memory testing, thus not suggestive of a prominent storage impairment, which is one of the hallmark testing patterns for this illness.   Frontotemporal dementia also cannot be fully ruled out given deficits in cognitive flexibility, expressive language, and his wife's report of  prominent behavior/personality changes. However, his age is very advanced for when this illness typically presents and behavior changes were only noted during the past year, rather than at the start of cognitive decline. At the present time, underlying Alzheimer's disease pathology, whether that be a logopenic primary progressive aphasia presentation or more traditional Alzheimer's disease, would seem the more  likely explanation for his dementia presentation in my opinion.   Recommendations: Mr. Merlo has already been prescribed a medication aimed to address memory loss and concerns surrounding Alzheimer's disease (i.e., donepezil /Aricept ). He is encouraged to continue taking this medication as prescribed. It is important to highlight that this medication has been shown to slow functional decline in some individuals. There is no current treatment which can stop or reverse cognitive decline when caused by a neurodegenerative illness.   His wife reported ongoing behavior concerns and personality changes surrounding increased irritability and anger/frustration. She was encouraged to discuss these behaviors with his medical team and consider medication interventions.   Admittedly, performance across neurocognitive testing is not a strong predictor of an individual's safety operating a motor vehicle. With that being said, I do have concerns surrounding driving safety, given quite severe impairments across tasks assessing complex attention, cognitive flexibility, and memory. My recommendation is that he fully abstain from driving behaviors pending the results of a formal driving evaluation. Should he or his family wish to pursue a formalized driving evaluation, they could reach out to the following agencies: [employer] in [place]: [phone] [facility]: [phone] [facility]: [phone] [facility]: [phone] or [phone]  Should there be  progression of current deficits over time, Mr. Bisping is unlikely to regain any independent living skills lost. Therefore, it is recommended that he remain as involved as possible in all aspects of household chores, finances, and medication management, with supervision to ensure adequate performance. He will likely benefit from the establishment and maintenance of a routine in order to maximize his functional abilities over time.  It will be important for Mr. Ingraham to have another person with him when in situations where he may need to process information, weigh the pros and cons of different options, and make decisions, in order to ensure that he fully understands and recalls all information to be considered.  If not already done, Mr. Plush and his family may want to discuss his wishes regarding durable power of attorney and medical decision making, so that he can have input into these choices. If they require legal assistance with this, long-term care resource access, or other aspects of estate planning, they could reach out to [employer] at [phone] for a free consultation. Additionally, they may wish to discuss future plans for caretaking and seek out community options for in home/residential care should they become necessary.  Mr. Reinhardt is encouraged to attend to lifestyle factors for brain health (e.g., regular physical exercise, good nutrition habits and consideration of the MIND-DASH diet, regular participation in cognitively-stimulating activities, and general stress management techniques), which are likely to have benefits for both emotional adjustment and cognition. Optimal control of vascular risk factors (including safe cardiovascular exercise and adherence to dietary recommendations) is encouraged. Likewise, continued compliance with his CPAP machine will also be important. Continued participation in activities which provide mental stimulation and social interaction is also recommended.    Important information should be provided to Mr. Offutt in written format in all instances. This information should be placed in a highly frequented and easily visible location within his home to promote recall. External strategies such as written notes in a consistently used memory journal, visual and nonverbal auditory cues such as a calendar on the refrigerator or appointments with alarm, such as on a cell phone, can also help maximize recall.  To address problems with processing speed, he may wish to consider:   -Ensuring that  he is alerted when essential material or instructions are being presented   -Adjusting the speed at which new information is presented   -Allowing for more time in comprehending, processing, and responding in conversation   -Repeating and paraphrasing instructions or conversations aloud  To address problems with fluctuating attention and/or executive dysfunction, he may wish to consider:   -Avoiding external distractions when needing to concentrate   -Limiting exposure to fast paced environments with multiple sensory demands   -Writing down complicated information and using checklists   -Attempting and completing one task at a time (i.e., no multi-tasking)   -Verbalizing aloud each step of a task to maintain focus   -Taking frequent breaks during the completion of steps/tasks to avoid fatigue   -Reducing the amount of information considered at one time   -Scheduling more difficult activities for a time of day where he is usually most alert  Mr. Schaper had difficulty with receptive language, particularly when presented in complex sentence structures. Presenting information it in short, concrete sentences will be helpful to ensure his comprehension.  Review of Records:   Mr. Jupin completed a comprehensive neuropsychological evaluation with myself on 12/23/2019. Results at that time suggested an impairment across confrontation naming, as well as additional weaknesses  across phonemic fluency and receptive language. Performance variability was further exhibited across executive functioning and learning and memory. Performance was appropriate across domains of processing speed, attention/concentration, semantic fluency, and visuospatial abilities. Mr. Wirt denied difficulties completing instrumental activities of daily living (ADLs) independently and was diagnosed with a mild neurocognitive disorder (mild cognitive impairment). The etiology of weaknesses was said to be unclear. Alzheimer's disease could not be ruled out given verbal memory dysfunction and impairments in confrontation naming. However, other performances remained strong. Repeat testing in 12-18 months was recommended.    He completed a repeat evaluation with myself on 12/22/2020. Relative to his evaluation one year prior, his most notable areas of decline surrounded executive functioning and attention/concentration. These were generally mild overall. There was also perhaps a more subtle decline across visual memory, semantic fluency, and confrontation naming; however, these latter declines are harder to detect. Verbal memory continues to represent an ongoing weakness. However, despite different instrumentation, I did not believe that significant decline had been captured since his previous evaluation at that time. Subtle improvements were seen across phonemic fluency and receptive language.   He completed a third evaluation with myself on 10/19/2022. Relative to his previous evaluation in August 2022, progressive decline was exhibited across executive functioning, both semantic and phonemic fluency, confrontation naming, and essentially all aspects of learning and memory. This appeared most pronounced surrounding aspects of expressive language. Other assessed cognitive domains exhibited relatively stability. No improvement was consistently observed. Concerns for underlying Alzheimer's disease continued to be  expressed.  Past Medical History:  Diagnosis Date   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   S/p 2nd ray amputation 2018- started after prolonged use of corn pad   Acute pulmonary embolism 04/19/2023   Acute upper GI bleeding 01/26/2022   Allergic rhinitis 08/19/2019   flonase    Anemia    BPH (benign prostatic hyperplasia) 07/02/2015   S/p TURP with multiple complications afterwards including recurrent hospitalizations. All started after a hemorrhoid surgery and later with urinary retention. Patient at one point was septic from urinary issues.     CAP (community acquired pneumonia) 03/2023   Diabetes mellitus type II, controlled 04/23/2007    Metformin  1g BID, amaryl  4mg --> 8mg , had to  add januvia  back  Never took victoza .   Januvia -lethargic and dizzy in past. 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   History of colon cancer    Tubular adenoma of colon; s/p colectomy 1992    History of dysplastic nevus    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   History of shingles    History of skin cancer    Hyperlipidemia associated with type 2 diabetes mellitus 04/23/2007   Atorvastatin  20mg  once weekly     Iritis 12/17/2008   Shingles 2010    Melanocytic nevus of trunk 10/31/2023   Mild neurocognitive disorder, concerns for Alzheimer's disease 12/23/2019   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   Obstructive sleep apnea 04/23/2007   uses CPAP nightly   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   Pulmonary nodule 02/04/2022   Sepsis secondary to UTI 03/29/2015   Severe sepsis (HCC) 03/31/2023    Past Surgical History:  Procedure Laterality Date    AMPUTATION TOE Right 09/14/2016   Procedure: RIGHT 2ND TOE/RAY AMPUTATION;  Surgeon: Jerri Kay HERO, MD;  Location: [facility];  Service: Orthopedics;  Laterality: Right;   APPENDECTOMY  05/03/1947   BIOPSY  01/28/2022   Procedure: BIOPSY;  Surgeon: Legrand Victory LITTIE DOUGLAS, MD;  Location: [facility] ENDOSCOPY;  Service: Gastroenterology;;   COLECTOMY  05/02/1990   CYSTOSCOPY N/A 06/21/2015   Procedure: CYSTOSCOPY, CLOT EVACUATION, AND CAUTERIZATION OF PROSTATE FOSSA;  Surgeon: Arlena Gal, MD;  Location: [facility];  Service: Urology;  Laterality: N/A;   ESOPHAGOGASTRODUODENOSCOPY (EGD) WITH PROPOFOL  N/A 01/26/2022   Procedure: ESOPHAGOGASTRODUODENOSCOPY (EGD) WITH PROPOFOL ;  Surgeon: Legrand Victory LITTIE DOUGLAS, MD;  Location: [facility] ENDOSCOPY;  Service: Gastroenterology;  Laterality: N/A;   ESOPHAGOGASTRODUODENOSCOPY (EGD) WITH PROPOFOL  N/A 01/28/2022   Procedure: ESOPHAGOGASTRODUODENOSCOPY (EGD) WITH PROPOFOL ;  Surgeon: Legrand Victory LITTIE DOUGLAS, MD;  Location: [facility] ENDOSCOPY;  Service: Gastroenterology;  Laterality: N/A;   HEMORRHOID SURGERY     HEMOSTASIS CLIP PLACEMENT  01/26/2022   Procedure: HEMOSTASIS CLIP PLACEMENT;  Surgeon: Legrand Victory LITTIE DOUGLAS, MD;  Location: [facility] ENDOSCOPY;  Service: Gastroenterology;;   HEMOSTASIS CONTROL  01/26/2022   Procedure: HEMOSTASIS CONTROL;  Surgeon: Legrand Victory LITTIE DOUGLAS, MD;  Location: [facility] ENDOSCOPY;  Service: Gastroenterology;;   HOT HEMOSTASIS N/A 01/26/2022   Procedure: HOT HEMOSTASIS (ARGON PLASMA COAGULATION/BICAP);  Surgeon: Legrand Victory LITTIE DOUGLAS, MD;  Location: [facility] ENDOSCOPY;  Service: Gastroenterology;  Laterality: N/A;   MOLE REMOVAL  2022   hand   SCLEROTHERAPY  01/26/2022   Procedure: SCLEROTHERAPY;  Surgeon: Legrand Victory LITTIE DOUGLAS, MD;  Location: [facility] ENDOSCOPY;  Service: Gastroenterology;;   TOE SURGERY  04/2020   TRANSURETHRAL RESECTION OF PROSTATE N/A 05/23/2015   Procedure: TRANSURETHRAL RESECTION OF THE PROSTATE (TURP);  Surgeon: Norleen Seltzer, MD;  Location: [facility];  Service:  Urology;  Laterality: N/A;    Current Outpatient Medications:    [employer] STOMACH RELIEF 262 MG chewable tablet, Chew 2 tablets by mouth 4 (four) times daily., Disp: , Rfl:    donepezil  (ARICEPT ) 10 MG tablet, Take 1 tablet (10 mg total) by mouth at bedtime., Disp: 90 tablet, Rfl: 3   ELIQUIS  5 MG TABS tablet, TAKE 1  TABLET BY MOUTH TWICE A DAY, Disp: 60 tablet, Rfl: 4   ferrous sulfate  325 (65 FE) MG tablet, Take 1 tablet (325 mg total) by mouth 2 (two) times daily with a meal., Disp: , Rfl:    glipiZIDE  (GLUCOTROL ) 10 MG tablet, TAKE 1 TABLET BY MOUTH TWICE A DAY BEFORE A MEAL, Disp: 180 tablet, Rfl: 3   Glucagon  (GVOKE HYPOPEN  2-PACK) 0.5 MG/0.1ML SOAJ, Inject 0.5 mg into the skin daily as needed (for low blood sugar)., Disp: 0.2 mL, Rfl: 1   JANUVIA  100 MG tablet, TAKE 1 TABLET BY MOUTH EVERY DAY, Disp: 90 tablet, Rfl: 3   metFORMIN  (GLUCOPHAGE ) 1000 MG tablet, TAKE 1 TABLET BY MOUTH TWICE A DAY WITH A MEAL, Disp: 180 tablet, Rfl: 3   ONETOUCH ULTRA test strip, USE ONCE DAILY AS INSTRUCTED, Disp: 100 strip, Rfl: 3   rosuvastatin  (CRESTOR ) 20 MG tablet, TAKE 1 TABLET BY MOUTH ONE TIME PER WEEK, Disp: 13 tablet, Rfl: 3     09/20/2022    5:00 PM 09/02/2021   11:00 AM 02/19/2016   10:30 AM  MMSE - Mini Mental State Exam  Not completed:   --  Orientation to time 3 5   Orientation to Place 5 5   Registration 3 3   Attention/ Calculation 5 5   Recall 1 0   Language- name 2 objects 2 2   Language- repeat 1 1   Language- follow 3 step command 3 3   Language- read & follow direction 1 1   Write a sentence 1 1   Copy design 1 1   Total score 26 27    Neuroimaging: Brain MRI on 12/21/2019 revealed mild for age white matter disease and generalized volume loss. Brain MRI on 10/30/2022 suggested mild to moderate generalized cerebral atrophy, mild cerebellar atrophy, mild chronic microvascular ischemic disease, and remote punctate microhemorrhages within the posterior limb of the right internal capsule  and left cerebellar hemisphere.  Clinical Interview:   The following information was obtained during a clinical interview with Mr. Luiz and his wife prior to cognitive testing.  Cognitive Symptoms: Decreased short-term memory: Endorsed. Previously, memory difficulties were attributed to word finding concerns which were said to be present occasionally and not all the time. However, over time, there has been expressed concern surrounding rapid forgetting and storage impairments. Mr. Palka and his wife were unsure if memory abilities had declined relative to his previous June 2024 evaluation.  Decreased long-term memory: Denied. Decreased attention/concentration: Denied. Reduced processing speed: Denied. Difficulties with executive functions: Largely denied. He previously acknowledged some impulsivity in that he has a tendency to want things that can be completed done right away. This was said to be longstanding in nature and has persisted to present day. He denied difficulties with organization, decision making, or judgment. Difficulties with emotion regulation: Denied. However, his wife noted Mr. Goncalves having an extremely short fuse and can become quite irritable very quickly. This has worsened over the past year and is becoming problematic and can impact their relationship at times. Difficulties with receptive language: Denied. Difficulties with word finding: Endorsed. These have subjectively worsened over time.  Decreased visuoperceptual ability: Denied.   Difficulties completing ADLs: Largely denied. Mr. Milstein continues to be generally independent with medication management. Per his wife, there are infrequent instances where he exhibits confusion and may mix up a medication or two. His wife is in charge of Landscape architect and bill paying which is longstanding in nature. No driving related  concerns were reported. However, he and his wife did acknowledge that his PCP had previously advised him  to abstain from driving pursuits following his hospitalizations this past November and December.   Additional Medical History: History of traumatic brain injury/concussion: Denied. History of stroke: Denied. History of seizure activity: Denied. History of known exposure to toxins: Denied. Symptoms of chronic pain: Denied. Experience of frequent headaches/migraines: Denied. Frequent instances of dizziness/vertigo: Denied.   Sensory changes: Denied.  Balance/coordination difficulties: Denied. Other motor difficulties: Denied.  Other medical conditions: He was seen in the ED on 03/31/2023 with altered mental status. Imaging was concerning for bilateral lower lobe infiltrates. He was reportedly gurgling and coughing before coming to ED. COVID-19, influenza and RSV PCR were nonreactive. Full RVP was negative. Blood cultures were NGTD. Leukocytosis and lactic acid resolved. He was diagnosed with severe sepsis due to aspiration pneumonia, with concern for acute metabolic encephalopathy and delirium with agitation. He was discharged home on 04/02/2023 on p.o. Augmentin  for 3 more days to complete a total of 5 days course.  He was seen again in the ED on 04/19/2023 with altered mental status. He reportedly had not returned to his previous baseline prior to his previous admission. However, for the past 2 to 3 days prior to this admission, he had significant worsening with increasing weakness and significantly increased confusion and agitation. He underwent workup with imaging studies in the ER. CT was notable for acute PE involving RUL and RLL. There was also groundglass opacities in the right lower lobe concerning for infection. He was started on heparin  drip, oxygen, and antibiotics and admitted for further monitoring. He was discharged to a SNF on 05/03/2023.   Sleep History: Estimated hours obtained each night: 6-7 hours.  Difficulties falling asleep: Occasionally. He reported some trouble with insomnia  due to having an overactive mind and some anxiety which can prevent him from falling asleep quickly.  Difficulties staying asleep: Denied.  Feels rested and refreshed upon awakening: Endorsed for the most part.    History of snoring: Endorsed. History of waking up gasping for air: Endorsed. Witnessed breath cessation while asleep: Endorsed. He has a prior diagnosis of obstructive sleep apnea and utilizes his CPAP machine nightly.    History of vivid dreaming: Endorsed. He previously described occasional exacerbations of psychiatric distress and subsequent nightmares surrounding prior traumatic experiences. Medical records suggest potential trauma stemming from fleeing [place] during the [place] blitzkrieg during World War II.  Excessive movement while asleep: Endorsed.  Instances of acting out his dreams: Endorsed. Physical movement and acting out dream content is associated with nightmares and other vivid dreaming behaviors. His wife noted that this has seemed fairly diminished and less of an issue lately.   Psychiatric/Behavioral Health History: Depression: He described his current mood as good and denied to his knowledge any prior mental health concerns or diagnoses. Current or remote suicidal ideation, intent, or plan was also denied.  Anxiety: Denied. Mania: Denied. Trauma History: Unclear (see above). To his knowledge, he had never been formally diagnosed with PTSD in the past.  Visual/auditory hallucinations: Denied. Delusional thoughts: Denied.   Tobacco: Denied. Alcohol : He denied current alcohol  consumption as well as a history of problematic alcohol  abuse or dependence.  Recreational drugs: Denied.  Family History: Problem Relation Age of Onset   Alzheimer's disease Mother    Colon cancer Father 70   Diabetes Father        type I   Colon cancer Paternal Grandmother 64  Rectal cancer Maternal Uncle    Cancer Other        colon/fhx   Esophageal cancer Neg Hx    Stomach  cancer Neg Hx    This information was confirmed by Mr. Wojnarowski.  Academic/Vocational History: Highest level of educational attainment: 18 years. Mr. Merriweather was born in [place] and spent time in [place] prior to coming to the [place] . While he completed the equivalent of high school overseas, he had to repeat the final two years of high school when coming to the [place]  due to him not knowing who Kathy Wahid [place]  was. His first language was [place]. However, he speaks [place] fluently and considers it his primary language. 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 He described himself as a strong (A/B) student in academic settings. English was described as a relative weakness earlier in academic settings.  History of developmental delay: Denied. History of grade repetition: Denied. Enrollment in special education courses: Denied. History of LD/ADHD: Denied.   Employment: Retired. 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   Evaluation Results:   Behavioral Observations: Mr. Cochrane was accompanied by his wife, arrived to his appointment on time, and was appropriately dressed and groomed. He appeared alert. Observed gait and station were within normal limits. Gross motor functioning appeared intact upon informal observation and no abnormal movements (e.g., tremors) were noted. His affect was generally relaxed and positive. Spontaneous speech was fluent and word finding difficulties were not observed during the clinical interview. Thought processes were coherent, organized, and normal in content. Insight into his cognitive difficulties appeared poor and I do question that he has proper insight into the full extent of cognitive impairment, especially surrounding quite severe memory dysfunction.   During testing, sustained attention was appropriate. Task engagement was adequate and he persisted when  challenged. Overall, Mr. Laufer was cooperative with the clinical interview and subsequent testing procedures.   Adequacy of Effort: The validity of neuropsychological testing is limited by the extent to which the individual being tested may be assumed to have exerted adequate effort during testing. Mr. Cicero expressed his intention to perform to the best of his abilities and exhibited adequate task engagement and persistence. Scores across stand-alone and embedded performance validity measures were within expectation. As such, the results of the current evaluation are believed to be a valid representation of Mr. Garcia's current cognitive functioning.  Test Results: Mr. Munford was fully oriented at the time of the current evaluation.  Intellectual abilities based upon educational and vocational attainment were estimated to be in the average to above average range. Premorbid abilities were estimated to be within the average range based upon a single-word reading test.   Processing speed was variable, ranging from the exceptionally low to average normative ranges. Basic attention was average. More complex attention (e.g., working memory) was well below average. Executive functioning was exceptionally low to below average.  Receptive language abilities were exceptionally low. Across this task, Mr. Howerter had fairly consistent trouble answering more conceptual questions (e.g., Point to the number for the total days in a week or Point to the blue circle). He also had trouble understanding more complex sentence structure, as well as following multi-step commands. Assessed expressive language was poor. Phonemic fluency was well below average to below average, semantic fluency was exceptionally low, and confrontation naming was exceptionally low.     Assessed visuospatial/visuoconstructional abilities were below  average to average outside of impairment when asked to draw a clock. Points were lost across his  drawing of a clock due to the number 12 being placed three separate times, as well as incorrect hand placement.   Learning (i.e., encoding) of novel verbal information was exceptionally low. Spontaneous delayed recall (i.e., retrieval) of previously learned information was below average across a figure-based task but exceptionally low across both verbal tasks. Retention rates were 0% across a list learning task, 50% across a story learning task (raw score of 1), and 31% across a figure drawing task. Performance across recognition tasks was below average to average, suggesting some evidence for information consolidation.   Results of emotional screening instruments suggested that recent symptoms of generalized anxiety were in the minimal range, while symptoms of depression were within normal limits. A screening instrument assessing recent sleep quality suggested the presence of minimal sleep dysfunction.  Table of Scores:   Note: This summary of test scores accompanies the interpretive report and should not be considered in isolation without reference to the appropriate sections in the text. Descriptors are based on appropriate normative data and may be adjusted based on clinical judgment. Terms such as Within Normal Limits and Outside Normal Limits are used when a more specific description of the test score cannot be determined. Descriptors refer to the current evaluation only.         Percentile - Normative Descriptor > 98 - Exceptionally High 91-97 - Well Above Average 75-90 - Above Average 25-74 - Average 9-24 - Below Average 2-8 - Well Below Average < 2 - Exceptionally Low         Validity: August 2022 June 2024 Current  DESCRIPTOR         DCT: --- --- --- --- Within Normal Limits  RBANS EI: --- --- --- --- Within Normal Limits  WAIS-IV RDS: --- --- --- --- Within Normal Limits         Orientation:        Raw Score Raw Score Raw Score Percentile   NAB Orientation, Form 1 28/29  26/29 29/29 --- ---         Cognitive Screening:        Raw Score Raw Score Raw Score Percentile   SLUMS: 20/30 17/30 9/30 --- ---         RBANS, Form A: Standard Score/ Scaled Score Standard Score/ Scaled Score Standard Score/ Scaled Score Percentile   Total Score 75 64 55 <1 Exceptionally Low  Immediate Memory 78 61 49 <1 Exceptionally Low    List Learning 4 4 2  <1 Exceptionally Low    Story Memory 8 3 2  <1 Exceptionally Low  Visuospatial/Constructional 75 78 84 14 Below Average    Figure Copy 6 6 8 25  Average    Line Orientation 12/20 13/20 13/20  10-16 Below Average  Language 83 54 40 <1 Exceptionally Low    Picture Naming 9/10 7/10 4/10 <2 Exceptionally Low    Semantic Fluency 3 1 1  <1 Exceptionally Low  Attention 106 109 85 16 Below Average    Digit Span 12 14 9  37 Average    Coding 10 9 6 9  Below Average  Delayed Memory 60 56 64 1 Exceptionally Low    List Recall 1/10 0/10 0/10 <2 Exceptionally Low    List Recognition 13/20 14/20 17/20  10-16 Below Average    Story Recall 6 3 3 1  Exceptionally Low    Story Recognition 8/12 6/12 8/12 14-28 Below Average  to Average    Figure Recall 6 6 6 9  Below Average    Figure Recognition 3/8 3/8 5/8 39-57 Average         Intellectual Functioning:        Standard Score Standard Score Standard Score Percentile   Test of Premorbid Functioning: 94 97 91 27 Average         Attention/Executive Function:       Trail Making Test (TMT): Raw Score Raw Score (T Score) Raw Score (T Score) Percentile     Part A 48 secs.,  1 error 62 secs,  1 error (36) 67 secs.,  1 error (32) 4 Well Below Average    Part B 162 secs.,  3 errors 241 secs.,  6 errors (32) 208 secs.,  5 errors (36) 8 Well Below Average           Scaled Score Scaled Score Scaled Score Percentile   WAIS-IV Digit Span: --- 8 4 2  Well Below Average    Forward --- 8 8 25  Average    Backward --- 7 4 2  Well Below Average    Sequencing --- 9 5 5  Well Below Average          Scaled  Score Scaled Score Scaled Score Percentile   WAIS-IV Similarities: --- 2 7 16  Below Average         D-KEFS Color-Word Interference Test: Raw Score (Scaled Score) Raw Score (Scaled Score) Raw Score (Scaled Score) Percentile     Color Naming 45 secs. (6) 50 secs. (4) 65 secs. (1) <1 Exceptionally Low    Word Reading 25 secs. (11) 27 secs. (10) 28 secs. (9) 37 Average    Inhibition 106 secs. (8) 175 secs. (1) Discontinued --- Impaired      Total Errors 4 errors (9) 9 errors (6) --- --- ---    Inhibition/Switching 110 secs. (9) 42 secs. (17) Discontinued --- Impaired      Total Errors 16 errors (1) 25 errors (1) --- --- ---         D-KEFS Verbal Fluency Test: Raw Score (Scaled Score) Raw Score (Scaled Score) Raw Score (Scaled Score) Percentile     Letter Total Correct --- 22 (7) 21 (7) 16 Below Average    Category Total Correct --- 11 (2) 11 (2) <1 Exceptionally Low    Category Switching Total Correct --- 4 (2) 4 (2) <1 Exceptionally Low    Category Switching Accuracy --- 2 (3) 3 (4) 2 Well Below Average      Total Set Loss Errors --- 5 (6) 6 (5) 5 Well Below Average      Total Repetition Errors --- 2 (11) 1 (12) 75 Above Average         NAB Executive Functions Module, Form 1: T Score T Score T Score Percentile     Judgment --- 50 41 18 Below Average         Language:       Verbal Fluency Test: Raw Score (T Score) Raw Score (T Score) Raw Score (T Score) Percentile     Phonemic Fluency (FAS) 31 (40) 22 (33) 21 (30) 2 Well Below Average    Animal Fluency 15 (43) 5 (<19) 4 (<19) <1 Exceptionally Low          NAB Language Module, Form 1: T Score T Score T Score Percentile     Auditory Comprehension 39 39 19 <1 Exceptionally Low    Naming 22/31 (24) 16/31 (19) 9/31 (  19) <1 Exceptionally Low         Visuospatial/Visuoconstruction:        Raw Score Raw Score Raw Score Percentile   Clock Drawing: 7/10 9/10 6/10 --- Impaired          Scaled Score Scaled Score Scaled Score Percentile    WAIS-IV Block Design: 10 10 10  50 Average         Mood and Personality:        Raw Score Raw Score Raw Score Percentile   Geriatric Depression Scale: 0 2 1 --- Within Normal Limits  Geriatric Anxiety Scale: 5 5 5  --- Minimal    Somatic 3 2 4  --- Minimal    Cognitive 0 0 0 --- Minimal    Affective 2 3 1  --- Minimal         Additional Questionnaires:        Raw Score Raw Score Raw Score Percentile   PROMIS Sleep Disturbance Questionnaire: 21 16 11  --- None to Slight   Informed Consent and Coding/Compliance:   The current evaluation represents a clinical evaluation for the purposes previously outlined by the referral source and is in no way reflective of a forensic evaluation.   Mr. Dolinger was provided with a verbal description of the nature and purpose of the present neuropsychological evaluation. Also reviewed were the foreseeable risks and/or discomforts and benefits of the procedure, limits of confidentiality, and mandatory reporting requirements of this provider. The patient was given the opportunity to ask questions and receive answers about the evaluation. Oral consent to participate was provided by the patient.   This evaluation was conducted by Arthea KYM Maryland, Ph.D., ABPP-CN, board certified clinical neuropsychologist. Mr. Dowdell completed a clinical interview with Dr. Maryland, billed as one unit [phone], and 135 minutes of cognitive testing and scoring, billed as one unit [phone] and four additional units 96139. Psychometrist Lonell Jude, B.S. assisted Dr. Maryland with test administration and scoring procedures. As a separate and discrete service, one unit [phone] and two units 96133 (160 minutes) were billed for Dr. Loralee time spent in interpretation and report writing.

## 2023-11-01 ENCOUNTER — Institutional Professional Consult (permissible substitution): Payer: Medicare Other | Admitting: Psychology

## 2023-11-01 ENCOUNTER — Ambulatory Visit: Payer: Self-pay

## 2023-11-07 ENCOUNTER — Ambulatory Visit

## 2023-11-07 ENCOUNTER — Ambulatory Visit: Admitting: Family Medicine

## 2023-11-07 ENCOUNTER — Encounter: Payer: Self-pay | Admitting: Family Medicine

## 2023-11-07 ENCOUNTER — Ambulatory Visit: Payer: Self-pay | Admitting: Family Medicine

## 2023-11-07 VITALS — BP 112/80 | HR 73 | Temp 97.3°F | Ht 72.0 in | Wt 190.0 lb

## 2023-11-07 DIAGNOSIS — R911 Solitary pulmonary nodule: Secondary | ICD-10-CM

## 2023-11-07 DIAGNOSIS — I10 Essential (primary) hypertension: Secondary | ICD-10-CM | POA: Diagnosis not present

## 2023-11-07 DIAGNOSIS — R053 Chronic cough: Secondary | ICD-10-CM

## 2023-11-07 DIAGNOSIS — Z7984 Long term (current) use of oral hypoglycemic drugs: Secondary | ICD-10-CM | POA: Diagnosis not present

## 2023-11-07 DIAGNOSIS — E119 Type 2 diabetes mellitus without complications: Secondary | ICD-10-CM

## 2023-11-07 LAB — COMPREHENSIVE METABOLIC PANEL WITH GFR
ALT: 18 U/L (ref 0–53)
AST: 17 U/L (ref 0–37)
Albumin: 4.4 g/dL (ref 3.5–5.2)
Alkaline Phosphatase: 65 U/L (ref 39–117)
BUN: 16 mg/dL (ref 6–23)
CO2: 28 meq/L (ref 19–32)
Calcium: 10 mg/dL (ref 8.4–10.5)
Chloride: 101 meq/L (ref 96–112)
Creatinine, Ser: 0.92 mg/dL (ref 0.40–1.50)
GFR: 77.22 mL/min (ref >60.00)
Glucose, Bld: 89 mg/dL (ref 70–99)
Potassium: 4.2 meq/L (ref 3.5–5.1)
Sodium: 138 meq/L (ref 135–145)
Total Bilirubin: 0.5 mg/dL (ref 0.2–1.2)
Total Protein: 7.3 g/dL (ref 6.0–8.3)

## 2023-11-07 LAB — MICROALBUMIN / CREATININE URINE RATIO
Creatinine,U: 39.5 mg/dL
Microalb Creat Ratio: 22.9 mg/g (ref 0.0–30.0)
Microalb, Ur: 0.9 mg/dL (ref 0.0–1.9)

## 2023-11-07 LAB — HEMOGLOBIN A1C: Hgb A1c MFr Bld: 7.8 % — ABNORMAL HIGH (ref 4.6–6.5)

## 2023-11-07 NOTE — Progress Notes (Signed)
 Phone [phone] In person visit   Subjective:   Walter [name] is a 83 y.o. year old very pleasant male patient who presents for/with See problem oriented charting Chief Complaint  Patient presents with   Medical Management of Chronic Issues   Hypertension   Diabetes    Discuss glipizide    Hyperlipidemia    Discuss rosuvastatin    walking issues    Past Medical History-  Patient Active Problem List   Diagnosis Date Noted   Dementia due to Alzheimer's disease 10/31/2023    Priority: High   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    Priority: High   BPH (benign prostatic hyperplasia) 07/02/2015    Priority: High   Diabetes mellitus type II, controlled 04/23/2007    Priority: High   History of GI bleed 04/20/2023    Priority: Medium    Pulmonary nodule 02/04/2022    Priority: Medium    History of colon cancer 01/27/2014    Priority: Medium    Hyperlipidemia associated with type 2 diabetes mellitus 04/23/2007    Priority: Medium    Obstructive sleep apnea 04/23/2007    Priority: Medium    Essential hypertension 04/23/2007    Priority: Medium    History of skin cancer     Priority: Low   Allergic rhinitis 08/19/2019    Priority: Low   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    Priority: Low   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    Priority: Low   Hallux valgus of right foot 03/26/2014    Priority: Low   Iritis 12/17/2008    Priority: Low   Postherpetic neuralgia 05/22/2008    Priority: Low   Bigeminy 04/21/2008    Priority: Low   Thyroid  nodule 03/31/2023   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    Medications- reviewed and updated Current Outpatient Medications  Medication Sig Dispense Refill   donepezil  (ARICEPT ) 10 MG tablet Take 1 tablet (10 mg total) by mouth at bedtime. 90 tablet 3   ferrous sulfate  325 (65 FE) MG tablet Take 1 tablet (325 mg total) by mouth 2 (two) times daily with a meal.     glipiZIDE  (GLUCOTROL ) 10 MG tablet TAKE 1 TABLET BY  MOUTH TWICE A DAY BEFORE A MEAL 180 tablet 3   Glucagon  (GVOKE HYPOPEN  2-PACK) 0.5 MG/0.1ML SOAJ Inject 0.5 mg into the skin daily as needed (for low blood sugar). 0.2 mL 1   JANUVIA  100 MG tablet TAKE 1 TABLET BY MOUTH EVERY DAY 90 tablet 3   metFORMIN  (GLUCOPHAGE ) 1000 MG tablet TAKE 1 TABLET BY MOUTH TWICE A DAY WITH A MEAL 180 tablet 3   ONETOUCH ULTRA test strip USE ONCE DAILY AS INSTRUCTED 100 strip 3   rosuvastatin  (CRESTOR ) 20 MG tablet TAKE 1 TABLET BY MOUTH ONE TIME PER WEEK 13 tablet 3   [employer] STOMACH RELIEF 262 MG chewable tablet Chew 2 tablets by mouth 4 (four) times daily.     No current facility-administered medications for this visit.     Objective:  BP 112/80   Pulse 73   Temp (!) 97.3 F (36.3 C)   Ht 6' (1.829 m)   Wt 190 lb (86.2 kg)   SpO2 96%   BMI 25.77 kg/m  Gen: NAD, resting comfortably CV: RRR no murmurs rubs or gallops Lungs: CTAB no crackles, wheeze, rhonchi Ext: minimal edema Skin: warm, dry     Assessment and Plan   # Pulmonary embolism-diagnosed during December 2024 hospitalization with plans for 6 months  of treatment at least through June 2025-potentially related to sedentary activity with pneumonia versus possible provocation by COVID vaccination-he has completed 12-month of therapy and we discussed option of discontinuing . Bruises a lot with this and wants to D/C . He is a lot more mobile now and this should not be a major issues as he plans to not do any further COVID shots and he activity is better. If recurrence we would need likely lifelong therapy- look out for shortness of breath ,chest pain,  leg swelling or calf pain  # Mild cognitive impairment-follows with Dr. Georjean S: Medication: Donepezil  10 mg -worsened around 2021 then again in late 2024 after COVID shot then aspiration pneumonia then PULMONARY EMBOLISM  -some irritability A/P: overall stable- discussed option of medication like Lexapro 5 mg or therapy but he wants to hold off    #  Diabetes-poorly controlled at times  S: Medication:glipizide  10 mg twice daily (some sweats at times and wonder if sugar could be low- encouraged to check if this recurs), januvia  100mg  daily, metformin   1000mg  twice daily  -gvoke available at home  CBGs- earlier today 134 Exercise and diet- feels could increase exercise back up Lab Results  Component Value Date   HGBA1C 7.1 (H) 07/11/2023   HGBA1C 7.1 (H) 04/11/2023   HGBA1C 7.4 (H) 12/27/2022  A/P: hopefully stable- update a1c today. Continue current meds for now but we plan if a1c is stable to reduce glipizide  to once a day in the morning to lower low blood sugar risk -seems to tip forward almost or side to side if walks faster- feels like propelled forward almost particularly downhill- discussed could check sugar to make sure- and could also use cane   #hypertension S: medication: None -On discharge May 03, 2023-stopped amlodipine  5 mg every day and telmisartan  80 mg BP Readings from Last 3 Encounters:  11/07/23 112/80  09/29/23 (!) 144/78  07/05/23 126/70  A/P:  well controlled without medicine- continue without medicine  #hyperlipidemia S: Medication: Rosuvastatin  20 mg once a week on Sunday nights Lab Results  Component Value Date   CHOL 165 07/11/2023   HDL 51.10 07/11/2023   LDLCALC 91 07/11/2023   LDLDIRECT 90.0 09/14/2017   TRIG 115.0 07/11/2023   CHOLHDL 3 07/11/2023  A/P: imperfect control but would continue current medications   #Chronic cough- intermittent issues since last year- gets in coughing spells. Does have reflux history and only uses as needed OTC (available over the counter without a prescription) medicines. No allergies while on allergy pill. Seems phlegmy - get CXR with history of pneumonia to make sure no opacities -we actually opted to do chest CT without contrast intead as has lung nodule needs follow up on  -may consider proton pump inhibitor (PPI) stomach acid reducer trial but wanted imaging  first  #also due for repeat pancreas scan July 2026- not yet due  Recommended follow up: Return in about 4 months (around 03/09/2024) for followup or sooner if needed.Schedule b4 you leave. Future Appointments  Date Time Provider Department Center  11/10/2023 10:00 AM Richie Hussar, PhD [facility] None  12/19/2023  2:45 PM Gershon Donnice SAUNDERS, DPM [facility]  04/19/2024 11:30 AM Dina, Camie BRAVO, PA-C [facility] None    Lab/Order associations:   ICD-10-CM   1. Chronic cough  R05.3 DG Chest 2 View    CT Chest Wo Contrast    2. Controlled type 2 diabetes mellitus without complication, without long-term current use of insulin  (HCC)  E11.9  Urine Microalbumin w/creat. ratio    Comprehensive metabolic panel with GFR    Hemoglobin A1c    3. Essential hypertension  I10 Comprehensive metabolic panel with GFR    4. Solitary pulmonary nodule  R91.1 CT Chest Wo Contrast       Return precautions advised.  Garnette Lukes, MD

## 2023-11-07 NOTE — Patient Instructions (Addendum)
 He is a lot more mobile now and this should not be a major issues as he plans to not do any further COVID shots and he activity is better. If recurrence we would need likely lifelong therapy- look out for shortness of breath , leg swelling or calf pain   hopefully stable- update a1c today. Continue current meds for now but we plan if a1c is stable to reduce glipizide  to once a day in the morning to lower low blood sugar risk  We will call you within two weeks about your referral for Chest CT through [facility].  Their phone number is [phone].  Please call them if you have not heard in 1-2 weeks  Please stop by lab before you go If you have mychart- we will send your results within 3 business days of us  receiving them.  If you do not have mychart- we will call you about results within 5 business days of us  receiving them.  *please also note that you will see labs on mychart as soon as they post. I will later go in and write notes on them- will say notes from Dr. Katrinka   Recommended follow up: Return in about 4 months (around 03/09/2024) for followup or sooner if needed.Schedule b4 you leave.

## 2023-11-08 ENCOUNTER — Encounter: Payer: Medicare Other | Admitting: Psychology

## 2023-11-10 ENCOUNTER — Ambulatory Visit: Admitting: Psychology

## 2023-11-10 DIAGNOSIS — F0280 Dementia in other diseases classified elsewhere without behavioral disturbance: Secondary | ICD-10-CM | POA: Diagnosis not present

## 2023-11-10 DIAGNOSIS — G309 Alzheimer's disease, unspecified: Secondary | ICD-10-CM

## 2023-11-10 NOTE — Progress Notes (Signed)
   Neuropsychology Feedback Session Walter [name]. [facility] Department of Neurology  Reason for Referral:   Walter [name] is a 83 y.o. right-handed Caucasian male referred by Camie Sevin, PA-C, to characterize his current cognitive functioning and assist with diagnostic clarity and treatment planning in the context of a previously diagnosed mild neurocognitive disorder, concerns for underlying Alzheimer's disease, and concerns for progressive cognitive decline.   Feedback:   Walter [name] completed a comprehensive neuropsychological evaluation on 10/31/2023. Please refer to that encounter for the full report and recommendations. Briefly, results suggested severe impairment surrounding cognitive flexibility, receptive language, semantic fluency, confrontation naming, clock drawing, and both encoding and retrieval aspects of verbal learning and memory. Additional weakness/impairment was exhibited across complex attention and phonemic fluency. Processing speed was variable. Relative to his neuropsychological evaluation in August 2022 (selected for greater test consistency), quite noteworthy decline has occurred across numerous cognitive domains. Decline is most substantial across expressive and receptive language, as well as both encoding (i.e., learning) and relayed retrieval aspects of memory. Decline is also noteworthy across processing speed, attention/concentration, and executive functioning. Relative to his most recent evaluation in June 2024, significant decline can again be seen across expressive and receptive language, as well as attention/concentration and executive functioning. Other domains exhibited relative stability over the past year. The likelihood of an underlying neurodegenerative illness remains quite high given evidence for progressive decline since 2021. Consideration should be given to the language variant of Alzheimer's disease (sometimes known as the logopenic variant or a  logopenic primary progressive aphasia presentation). Historically, word finding and trouble with expressive language has been Walter [name]'s most salient concern. Expressive language has represented the most significant area of impairment (even predating prominent memory impairment) and greatest area of decline over time, which would align with this presentation. Given that this illness shares underlying pathology with typically presenting Alzheimer's disease, prominent memory impairment would also align with this presentation. Also on his differential should remain Alzheimer's disease itself. Retention rates were very poor across memory testing and he has exhibited progressive decline over the past several years. Deficits in expressive language, clock drawing, and cognitive flexibility would follow the expected progression of this illness as it spreads. Curiously, he did show some benefit from cueing across current memory testing, thus not suggestive of a prominent storage impairment, which is one of the hallmark testing patterns for this illness.   Walter [name] was accompanied by his wife during the current feedback session. Content of the current session focused on the results of his neuropsychological evaluation. Walter [name] was given the opportunity to ask questions and his questions were answered. He was encouraged to reach out should additional questions arise. A copy of his report was provided at the conclusion of the visit.      One unit 96132 (37 minutes) was billed for Dr. Loralee time spent preparing for, conducting, and documenting the current feedback session with Walter [name].

## 2023-11-14 ENCOUNTER — Ambulatory Visit
Admission: RE | Admit: 2023-11-14 | Discharge: 2023-11-14 | Disposition: A | Discharge status: Home / Self Care | Source: Ambulatory Visit | Attending: Family Medicine | Admitting: Family Medicine

## 2023-11-14 DIAGNOSIS — R911 Solitary pulmonary nodule: Secondary | ICD-10-CM

## 2023-11-14 DIAGNOSIS — R053 Chronic cough: Secondary | ICD-10-CM

## 2023-12-14 NOTE — Telephone Encounter (Signed)
 I read through your notes and did not see anythinh pertaining to iron. Please advise.

## 2023-12-19 ENCOUNTER — Ambulatory Visit: Admitting: Podiatry

## 2023-12-19 ENCOUNTER — Encounter: Payer: Self-pay | Admitting: Podiatry

## 2023-12-19 DIAGNOSIS — M79674 Pain in right toe(s): Secondary | ICD-10-CM | POA: Diagnosis not present

## 2023-12-19 DIAGNOSIS — M79675 Pain in left toe(s): Secondary | ICD-10-CM

## 2023-12-19 DIAGNOSIS — B351 Tinea unguium: Secondary | ICD-10-CM | POA: Diagnosis not present

## 2023-12-19 DIAGNOSIS — Z7901 Long term (current) use of anticoagulants: Secondary | ICD-10-CM | POA: Diagnosis not present

## 2023-12-20 NOTE — Progress Notes (Signed)
 Subjective: Chief Complaint  Patient presents with   Diabetes    [facility] NIDDM A1C 7.8. Toenail trim. 0 pain.    83 year old male presents the office today with above concerns.  States the nails are thickened elongated he cannot trim them himself and causing discomfort.    Last A1c was 7.1 in July 11, 2023  Katrinka Garnette KIDD, MD Last seen July 05, 2023  On Eliquis    Objective: AAO x3, NAD DP/PT pulses palpable bilaterally, CRT less than 3 seconds Nails are hypertrophic, dystrophic, brittle, discolored, elongated 9. No surrounding redness or drainage. 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  No pain with calf compression, swelling, warmth, erythema  Assessment: 83 year old male with symptomatic onychomycosis; preulcerative callus  Plan: -All treatment options discussed with the patient including all alternatives, risks, complications.  -Sharply debrided the nails x 9 without any complications or bleeding. -Patient encouraged to call the office with any questions, concerns, change in symptoms.   Return in about 3 months (around 03/20/2024).    Donnice JONELLE Fees DPM

## 2024-01-18 LAB — HM DIABETES EYE EXAM

## 2024-02-15 ENCOUNTER — Ambulatory Visit: Admitting: Pulmonary Disease

## 2024-02-15 ENCOUNTER — Encounter: Payer: Self-pay | Admitting: Pulmonary Disease

## 2024-02-15 VITALS — BP 135/95 | HR 65 | Temp 97.6°F | Ht 72.0 in | Wt 189.0 lb

## 2024-02-15 DIAGNOSIS — G4733 Obstructive sleep apnea (adult) (pediatric): Secondary | ICD-10-CM

## 2024-02-15 NOTE — Patient Instructions (Addendum)
 I will see you a year from now  Continue using your CPAP on a nightly basis  The download from your machine shows the machine is working well  Call us  with significant concerns  Make sure you are exercising regularly

## 2024-02-15 NOTE — Progress Notes (Signed)
 Walter [name]    [id number]    Oct 07, 1940  Primary Care Physician:Hunter, Garnette KIDD, MD  Referring Physician: Katrinka Garnette KIDD, MD [address] [place],  [place] [address]  Chief complaint:   History of obstructive sleep apnea In for follow-up for obstructive sleep apnea HPI:  Since his last visit he did have a hospitalization secondary to reaction from having a COVID and flu vaccine hospitalized for couple of days And another hospitalization for aspiration pneumonia Did have a pulmonary embolism  Has tried to remain active Continues to use his CPAP nightly  Obstructive sleep apnea diagnosed about 21 years ago, mild obstructive sleep apnea  Current machine is about 83 years old  He wakes up feeling rested Continues to benefit from CPAP use  He has hypertension, diabetes Was treated for colon cancer 89  He feels well  Outpatient Encounter Medications as of 02/15/2024  Medication Sig   [employer] STOMACH RELIEF 262 MG chewable tablet Chew 2 tablets by mouth 4 (four) times daily. (Patient taking differently: Chew 2 tablets by mouth as needed.)   donepezil  (ARICEPT ) 10 MG tablet Take 1 tablet (10 mg total) by mouth at bedtime.   glipiZIDE  (GLUCOTROL ) 10 MG tablet TAKE 1 TABLET BY MOUTH TWICE A DAY BEFORE A MEAL   Glucagon  (GVOKE HYPOPEN  2-PACK) 0.5 MG/0.1ML SOAJ Inject 0.5 mg into the skin daily as needed (for low blood sugar).   JANUVIA  100 MG tablet TAKE 1 TABLET BY MOUTH EVERY DAY   metFORMIN  (GLUCOPHAGE ) 1000 MG tablet TAKE 1 TABLET BY MOUTH TWICE A DAY WITH A MEAL   ONETOUCH ULTRA test strip USE ONCE DAILY AS INSTRUCTED   rosuvastatin  (CRESTOR ) 20 MG tablet TAKE 1 TABLET BY MOUTH ONE TIME PER WEEK (Patient taking differently: Once a week)   ferrous sulfate  325 (65 FE) MG tablet Take 1 tablet (325 mg total) by mouth 2 (two) times daily with a meal. (Patient not taking: Reported on 02/15/2024)   No facility-administered encounter medications on file as of 02/15/2024.     Allergies as of 02/15/2024 - Review Complete 02/15/2024  Allergen Reaction Noted   Bactrim  [sulfamethoxazole -trimethoprim ] Nausea And Vomiting 01/11/2016    Past Medical History:  Diagnosis Date   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   S/p 2nd ray amputation 2018- started after prolonged use of corn pad   Acute pulmonary embolism 04/19/2023   Acute upper GI bleeding 01/26/2022   Allergic rhinitis 08/19/2019   flonase    Anemia    BPH (benign prostatic hyperplasia) 07/02/2015   S/p TURP with multiple complications afterwards including recurrent hospitalizations. All started after a hemorrhoid surgery and later with urinary retention. Patient at one point was septic from urinary issues.     CAP (community acquired pneumonia) 03/2023   Dementia due to Alzheimer's disease 10/31/2023   Previously MCI diagnosed [facility] [place], [facility] neurocognitive eval. Follows with Dr. Georjean     Diabetes mellitus type II, controlled 04/23/2007    Metformin  1g BID, amaryl  4mg --> 8mg , had to add januvia  back  Never took victoza .   Januvia -lethargic and dizzy in past. 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   History  of colon cancer    Tubular adenoma of colon; s/p colectomy 1992    History of dysplastic nevus    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   History of shingles    History of skin cancer    Hyperlipidemia associated with type 2 diabetes mellitus 04/23/2007   Atorvastatin  20mg  once weekly     Iritis 12/17/2008   Shingles 2010    Melanocytic nevus of trunk 10/31/2023   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   Obstructive sleep apnea 04/23/2007   uses CPAP nightly    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   Pulmonary nodule 02/04/2022   Sepsis secondary to UTI 03/29/2015   Severe sepsis (HCC) 03/31/2023    Past Surgical History:  Procedure Laterality Date   AMPUTATION TOE Right 09/14/2016   Procedure: RIGHT 2ND TOE/RAY AMPUTATION;  Surgeon: Jerri Kay HERO, MD;  Location: [facility];  Service: Orthopedics;  Laterality: Right;   APPENDECTOMY  05/03/1947   BIOPSY  01/28/2022   Procedure: BIOPSY;  Surgeon: Legrand Victory LITTIE DOUGLAS, MD;  Location: [facility] ENDOSCOPY;  Service: Gastroenterology;;   COLECTOMY  05/02/1990   CYSTOSCOPY N/A 06/21/2015   Procedure: CYSTOSCOPY, CLOT EVACUATION, AND CAUTERIZATION OF PROSTATE FOSSA;  Surgeon: Arlena Gal, MD;  Location: [facility];  Service: Urology;  Laterality: N/A;   ESOPHAGOGASTRODUODENOSCOPY (EGD) WITH PROPOFOL  N/A 01/26/2022   Procedure: ESOPHAGOGASTRODUODENOSCOPY (EGD) WITH PROPOFOL ;  Surgeon: Legrand Victory LITTIE DOUGLAS, MD;  Location: [facility] ENDOSCOPY;  Service: Gastroenterology;  Laterality: N/A;   ESOPHAGOGASTRODUODENOSCOPY (EGD) WITH PROPOFOL  N/A 01/28/2022   Procedure: ESOPHAGOGASTRODUODENOSCOPY (EGD) WITH PROPOFOL ;  Surgeon: Legrand Victory LITTIE DOUGLAS, MD;  Location: [facility] ENDOSCOPY;  Service: Gastroenterology;  Laterality: N/A;   HEMORRHOID SURGERY     HEMOSTASIS CLIP PLACEMENT  01/26/2022   Procedure: HEMOSTASIS CLIP PLACEMENT;  Surgeon: Legrand Victory LITTIE DOUGLAS, MD;  Location: [facility] ENDOSCOPY;  Service: Gastroenterology;;   HEMOSTASIS CONTROL  01/26/2022   Procedure: HEMOSTASIS CONTROL;  Surgeon: Legrand Victory LITTIE DOUGLAS, MD;  Location: [facility] ENDOSCOPY;  Service: Gastroenterology;;   HOT HEMOSTASIS N/A 01/26/2022   Procedure: HOT HEMOSTASIS (ARGON PLASMA COAGULATION/BICAP);  Surgeon: Legrand Victory LITTIE DOUGLAS, MD;  Location: [facility] ENDOSCOPY;  Service: Gastroenterology;  Laterality: N/A;   MOLE REMOVAL  2022   hand   SCLEROTHERAPY  01/26/2022   Procedure: SCLEROTHERAPY;  Surgeon: Legrand Victory LITTIE DOUGLAS, MD;  Location: [facility] ENDOSCOPY;   Service: Gastroenterology;;   TOE SURGERY  04/2020   TRANSURETHRAL RESECTION OF PROSTATE N/A 05/23/2015   Procedure: TRANSURETHRAL RESECTION OF THE PROSTATE (TURP);  Surgeon: Norleen Seltzer, MD;  Location: [facility];  Service: Urology;  Laterality: N/A;    Family History  Problem Relation Age of Onset   Alzheimer's disease Mother    Colon cancer Father 43   Diabetes Father        type I   Colon cancer Paternal Grandmother 28   Rectal cancer Maternal Uncle    Cancer Other        colon/fhx   Esophageal cancer Neg Hx    Stomach cancer Neg Hx     Social History   Socioeconomic History   Marital status: Married    Spouse name: Not on file   Number of children: 2   Years of education: 18   Highest education level: Master's degree (e.g., MA, MS, MEng, MEd, MSW, MBA)  Occupational History   Occupation: Retired  Tobacco Use   Smoking status: Former    Current packs/day: 0.25  Average packs/day: 0.3 packs/day for 10.0 years (2.5 ttl pk-yrs)    Types: Pipe, Cigarettes   Smokeless tobacco: Never  Vaping Use   Vaping status: Never Used  Substance and Sexual Activity   Alcohol  use: Not Currently    Alcohol /week: 2.0 standard drinks of alcohol     Types: 2 Cans of beer per week    Comment: social   Drug use: No   Sexual activity: Not Currently  Other Topics Concern   Not on file  Social History Narrative   Married 1966. 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       Retired 2001-[employer]      Hobbies: hunting, fishing, walking, Geneticist, molecular (600-700 rounds per week)      No religious beliefs affecting health care, no afterlife beliefs      Right Handed      Two Story Home   Social Drivers of Health   Financial Resource Strain: Low Risk  (06/28/2023)   Overall Financial Resource Strain (CARDIA)    Difficulty of Paying Living Expenses:  Not hard at all  Food Insecurity: No Food Insecurity (06/28/2023)   Hunger Vital Sign    Worried About Running Out of Food in the Last Year: Never true    Ran Out of Food in the Last Year: Never true  Transportation Needs: No Transportation Needs (06/28/2023)   PRAPARE - Administrator, Civil Service (Medical): No    Lack of Transportation (Non-Medical): No  Physical Activity: Sufficiently Active (06/28/2023)   Exercise Vital Sign    Days of Exercise per Week: 3 days    Minutes of Exercise per Session: 60 min  Stress: No Stress Concern Present (06/28/2023)   [employer] of Occupational Health - Occupational Stress Questionnaire    Feeling of Stress : Not at all  Social Connections: Moderately Integrated (06/28/2023)   Social Connection and Isolation Panel    Frequency of Communication with Friends and Family: Once a week    Frequency of Social Gatherings with Friends and Family: More than three times a week    Attends Religious Services: Never    Database administrator or Organizations: Yes    Attends Banker Meetings: 1 to 4 times per year    Marital Status: Married  Catering manager Violence: Not At Risk (06/28/2023)   Humiliation, Afraid, Rape, and Kick questionnaire    Fear of Current or Ex-Partner: No    Emotionally Abused: No    Physically Abused: No    Sexually Abused: No    Review of Systems  Respiratory:  Positive for apnea.   Psychiatric/Behavioral:  Positive for sleep disturbance.   All other systems reviewed and are negative.   Vitals:   02/15/24 1423  BP: (!) 155/82  Pulse: 65  Temp: 97.6 F (36.4 C)  SpO2: 98%     Physical Exam Constitutional:      Appearance: Normal appearance.  HENT:     Head: Normocephalic.     Nose: Nose normal.     Mouth/Throat:     Mouth: Mucous membranes are moist.  Eyes:     General:        Right eye: No discharge.        Left eye: No discharge.  Cardiovascular:     Rate and Rhythm: Normal rate  and regular rhythm.  Pulses: Normal pulses.     Heart sounds: Normal heart sounds. No murmur heard.    No friction rub.  Pulmonary:     Effort: Pulmonary effort is normal. No respiratory distress.     Breath sounds: Normal breath sounds. No stridor. No wheezing or rhonchi.  Musculoskeletal:     Cervical back: No rigidity or tenderness.  Neurological:     Mental Status: He is alert.  Psychiatric:        Mood and Affect: Mood normal.    Compliance revealed 100% compliance average use of 7 hours 13 minutes AutoSet 5-15 residual AHI of 2.2   Assessment:  Mild obstructive sleep apnea -Continues to use CPAP on a nightly basis - Compliance is excellent  History of pulmonary embolism  Type 2 diabetes  Dementia  Plan/Recommendations: Continue yearly follow-up  Encouraged to give us  a call with significant concerns  Encourage graded activities as tolerated  I spent 30 minutes dedicated to the care of this patient on the date of this encounter to include previsit review of records, face-to-face time with the patient discussing conditions above, post visit ordering of testing,ordering medications,independentlyinterpreting results, clinical documentation with electronic health record and communicated necessary findings to members of the patient's care team  Jennet Epley MD [facility] 02/15/2024, 2:38 PM  CC: Katrinka Garnette KIDD, MD

## 2024-03-08 ENCOUNTER — Encounter: Payer: Self-pay | Admitting: Family Medicine

## 2024-03-08 ENCOUNTER — Encounter: Payer: Self-pay | Admitting: Lab

## 2024-03-11 ENCOUNTER — Ambulatory Visit: Admitting: Family Medicine

## 2024-03-11 ENCOUNTER — Encounter: Payer: Self-pay | Admitting: Family Medicine

## 2024-03-11 ENCOUNTER — Ambulatory Visit: Payer: Self-pay | Admitting: Family Medicine

## 2024-03-11 VITALS — BP 118/64 | HR 78 | Temp 97.9°F | Ht 72.0 in | Wt 188.6 lb

## 2024-03-11 DIAGNOSIS — R911 Solitary pulmonary nodule: Secondary | ICD-10-CM

## 2024-03-11 DIAGNOSIS — Z23 Encounter for immunization: Secondary | ICD-10-CM

## 2024-03-11 DIAGNOSIS — E1169 Type 2 diabetes mellitus with other specified complication: Secondary | ICD-10-CM | POA: Diagnosis not present

## 2024-03-11 DIAGNOSIS — R053 Chronic cough: Secondary | ICD-10-CM

## 2024-03-11 DIAGNOSIS — E785 Hyperlipidemia, unspecified: Secondary | ICD-10-CM

## 2024-03-11 DIAGNOSIS — I10 Essential (primary) hypertension: Secondary | ICD-10-CM | POA: Diagnosis not present

## 2024-03-11 DIAGNOSIS — Z7984 Long term (current) use of oral hypoglycemic drugs: Secondary | ICD-10-CM

## 2024-03-11 DIAGNOSIS — E119 Type 2 diabetes mellitus without complications: Secondary | ICD-10-CM | POA: Diagnosis not present

## 2024-03-11 DIAGNOSIS — D509 Iron deficiency anemia, unspecified: Secondary | ICD-10-CM

## 2024-03-11 LAB — HEMOGLOBIN A1C: Hgb A1c MFr Bld: 7.7 % — ABNORMAL HIGH (ref 4.6–6.5)

## 2024-03-11 LAB — COMPREHENSIVE METABOLIC PANEL WITH GFR
ALT: 18 U/L (ref 0–53)
AST: 16 U/L (ref 0–37)
Albumin: 4.4 g/dL (ref 3.5–5.2)
Alkaline Phosphatase: 64 U/L (ref 39–117)
BUN: 15 mg/dL (ref 6–23)
CO2: 29 meq/L (ref 19–32)
Calcium: 9.5 mg/dL (ref 8.4–10.5)
Chloride: 103 meq/L (ref 96–112)
Creatinine, Ser: 0.95 mg/dL (ref 0.40–1.50)
GFR: 74.12 mL/min (ref >60.00)
Glucose, Bld: 102 mg/dL — ABNORMAL HIGH (ref 70–99)
Potassium: 4.6 meq/L (ref 3.5–5.1)
Sodium: 140 meq/L (ref 135–145)
Total Bilirubin: 0.4 mg/dL (ref 0.2–1.2)
Total Protein: 6.8 g/dL (ref 6.0–8.3)

## 2024-03-11 LAB — IBC + FERRITIN
Ferritin: 36.6 ng/mL (ref 22.0–322.0)
Iron: 53 ug/dL (ref 42–165)
Saturation Ratios: 16 % — ABNORMAL LOW (ref 20.0–50.0)
TIBC: 331.8 ug/dL (ref 250.0–450.0)
Transferrin: 237 mg/dL (ref 212.0–360.0)

## 2024-03-11 LAB — CBC WITH DIFFERENTIAL/PLATELET
Basophils Absolute: 0 K/uL (ref 0.0–0.1)
Basophils Relative: 0.5 % (ref 0.0–3.0)
Eosinophils Absolute: 0.1 K/uL (ref 0.0–0.7)
Eosinophils Relative: 1.6 % (ref 0.0–5.0)
HCT: 39.6 % (ref 39.0–52.0)
Hemoglobin: 13.5 g/dL (ref 13.0–17.0)
Lymphocytes Relative: 13.7 % (ref 12.0–46.0)
Lymphs Abs: 1.2 K/uL (ref 0.7–4.0)
MCHC: 34.2 g/dL (ref 30.0–36.0)
MCV: 92.4 fl (ref 78.0–100.0)
Monocytes Absolute: 0.7 K/uL (ref 0.1–1.0)
Monocytes Relative: 8.4 % (ref 3.0–12.0)
Neutro Abs: 6.4 K/uL (ref 1.4–7.7)
Neutrophils Relative %: 75.8 % (ref 43.0–77.0)
Platelets: 222 K/uL (ref 150.0–400.0)
RBC: 4.28 Mil/uL (ref 4.22–5.81)
RDW: 13.6 % (ref 11.5–15.5)
WBC: 8.4 K/uL (ref 4.0–10.5)

## 2024-03-11 NOTE — Patient Instructions (Addendum)
 We will call you within two weeks about your referral for repeat CT chest through [facility].  Their phone number is [phone].  Please call them if you have not heard in 1-2 weeks  Please stop by lab before you go If you have mychart- we will send your results within 3 business days of us  receiving them.  If you do not have mychart- we will call you about results within 5 business days of us  receiving them.  *please also note that you will see labs on mychart as soon as they post. I will later go in and write notes on them- will say notes from Dr. Katrinka   May be able to stop iron if levels look good  No changes today unless labs lead us  to make changes  Recommended follow up: Return in about 14 weeks (around 06/17/2024) for followup or sooner if needed.Schedule b4 you leave.

## 2024-03-11 NOTE — Progress Notes (Signed)
 Phone [phone] In person visit   Subjective:   Walter [name] is a 83 y.o. year old very pleasant male patient who presents for/with See problem oriented charting Chief Complaint  Patient presents with   Medical Management of Chronic Issues    Pt would like to discuss if he still needs to stay on the iron pill; would like to discuss a cgm;    Toe Injury    Pt tripped getting in the shower last Thursday and injured the bottom of the left big toe;     Past Medical History-  Patient Active Problem List   Diagnosis Date Noted   Dementia due to Alzheimer's disease 10/31/2023    Priority: High   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    Priority: High   BPH (benign prostatic hyperplasia) 07/02/2015    Priority: High   Diabetes mellitus type II, controlled 04/23/2007    Priority: High   History of GI bleed 04/20/2023    Priority: Medium    Pulmonary nodule 02/04/2022    Priority: Medium    History of colon cancer 01/27/2014    Priority: Medium    Hyperlipidemia associated with type 2 diabetes mellitus 04/23/2007    Priority: Medium    Obstructive sleep apnea 04/23/2007    Priority: Medium    Essential hypertension 04/23/2007    Priority: Medium    History of skin cancer     Priority: Low   Allergic rhinitis 08/19/2019    Priority: Low   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 (Patient taking differently: Chew 2 tablets by mouth as needed.)      donepezil  (ARICEPT ) 10 MG tablet Take 1 tablet (10 mg total) by mouth at bedtime. 90 tablet 3   glipiZIDE  (GLUCOTROL ) 10 MG tablet TAKE 1 TABLET BY MOUTH TWICE A DAY BEFORE A MEAL 180 tablet 3   Glucagon  (GVOKE HYPOPEN  2-PACK) 0.5 MG/0.1ML SOAJ Inject 0.5 mg into the skin daily as needed (for low blood sugar). 0.2 mL 1   JANUVIA  100 MG tablet TAKE 1 TABLET BY MOUTH EVERY DAY 90 tablet 3   metFORMIN  (GLUCOPHAGE ) 1000 MG tablet TAKE 1 TABLET BY MOUTH TWICE A DAY WITH A MEAL 180 tablet 3   ONETOUCH ULTRA test strip USE ONCE DAILY AS INSTRUCTED 100 strip 3   rosuvastatin  (CRESTOR ) 20 MG tablet TAKE 1 TABLET BY MOUTH ONE TIME PER WEEK (Patient taking differently: Once a week) 13 tablet 3   ferrous sulfate  325 (65 FE) MG tablet Take 1 tablet (325 mg total) by mouth 2 (two) times daily with a meal. (Patient not taking: Reported on 03/11/2024)     No current facility-administered medications for this visit.     Objective:  BP 118/64 (BP Location: Left Arm, Patient Position: Sitting, Cuff Size: Normal)   Pulse 78   Temp 97.9 F (36.6 C) (Temporal)   Ht 6' (1.829 m)  Wt 188 lb 9.6 oz (85.5 kg)   SpO2 92%   BMI 25.58 kg/m  Gen: NAD, resting comfortably CV: RRR no murmurs rubs or gallops Lungs: CTAB no crackles, wheeze, rhonchi Ext: no edema Skin: warm, dry, cut on left great toe- 1 x 1 cm almost square area- extends more superficial another 1 cm towards toenail- no surrounding warmth erythema, purulent material     Assessment and Plan   # Toe bleeding-cut his toe getting in the shower recently-bled quite a bit and was difficult to stop.  Fortunately calm down by last Friday on bleeding. Has not rebled. Has podiatry visit Friday. No surrounding erythema or warmth- they will keep close eye on it.   #iron deficiency history- last levels looked normal on hemoglobin- has only intermittently taken iron- update today and if stable may discontinue   #nodular areas on chest CT-  3 to 6 months chest  CT from November 19, 2023- will update soon -cough is better thankfully. Mucinex helps cough  # Pulmonary embolism-diagnosed during December 2024 hospitalization with plans for 6 months of treatment at least through June 2025-potentially related to sedentary activity with pneumonia versus possible provocation by COVID vaccination -no recurrence and off medications- is planning on holding off on COVID shot  # Mild cognitive impairment-follows with Dr. Georjean S: Medication: Donepezil  10 mg -worsened around 2021 then again in late 2024 after COVID shot then aspiration pneumonia then PULMONARY EMBOLISM  A/P: more likely alzheimer's with worsening it appears- has follow up with neurology- back to back hospitalizations last year created set back    # Diabetes-poorly controlled at times  S: Medication:glipizide  10 mg twice daily in past- now once a day to avoid lows- has done well with this , januvia  100mg  daily, metformin   1000mg  twice daily  -gvoke available at home.   CBGs- got tired of checking sugars. But all #s under 170- morning sugar example- highest 165 on 01/11/24 but also in evenings may be the same Exercise and diet- active in yard and still shooting targets  Lab Results  Component Value Date   HGBA1C 7.8 (H) 11/07/2023   HGBA1C 7.1 (H) 07/11/2023   HGBA1C 7.1 (H) 04/11/2023  A/P: diabetes worsening last visit- plus we stopped his glipizide  2nd dose- hoping stable at least today- update a1c   #hypertension S: medication: None A/P: well controlled no medicine- continue to monitor   #hyperlipidemia S: Medication: Rosuvastatin  20 mg once a week   Lab Results  Component Value Date   CHOL 165 07/11/2023   HDL 51.10 07/11/2023   LDLCALC 91 07/11/2023   LDLDIRECT 90.0 09/14/2017   TRIG 115.0 07/11/2023   CHOLHDL 3 07/11/2023  A/P: resonable goal last visit- continue to monitor at least yearly  # GERD and history h pylori related ulcer/anemia- pantoprazole  only as needed- doing  well  #pancreatic Cyst- not due until June 2026 with reassuring PET scan in 2024  #elevated PSA- follow up closely with urology every 3 months.   Recommended follow up: Return in about 14 weeks (around 06/17/2024) for followup or sooner if needed.Schedule b4 you leave. Future Appointments  Date Time Provider Department Center  03/21/2024  1:45 PM Gershon Donnice [place]UN[place]ERS, [place] [facility]  04/19/2024 11:30 AM Dina, Camie BRAVO, PA-C [facility] None    Lab/Order associations:   ICD-10-CM   1. Diabetes mellitus type II, controlled  E11.9 Comprehensive metabolic panel with GFR    Hemoglobin A1c    2. Essential hypertension  I10  3. Hyperlipidemia associated with type 2 diabetes mellitus (HCC)  E11.69    E78.5     4. Chronic cough  R05.3 CT Chest Wo Contrast    5. Solitary pulmonary nodule  R91.1 CT Chest Wo Contrast    6. Iron deficiency anemia, unspecified iron deficiency anemia type  D50.9 CBC with Differential/Platelet    IBC + Ferritin    7. Immunization due  Z23 Flu vaccine HIGH DOSE PF(Fluzone Trivalent)    8. Long term current use of oral hypoglycemic drug  Z79.84       No orders of the defined types were placed in this encounter.   Return precautions advised.  Garnette Lukes, MD

## 2024-03-18 ENCOUNTER — Ambulatory Visit
Admission: RE | Admit: 2024-03-18 | Discharge: 2024-03-18 | Disposition: A | Source: Ambulatory Visit | Attending: Family Medicine | Admitting: Family Medicine

## 2024-03-18 DIAGNOSIS — R053 Chronic cough: Secondary | ICD-10-CM

## 2024-03-18 DIAGNOSIS — R911 Solitary pulmonary nodule: Secondary | ICD-10-CM

## 2024-03-21 ENCOUNTER — Encounter: Payer: Self-pay | Admitting: Podiatry

## 2024-03-21 ENCOUNTER — Ambulatory Visit: Admitting: Podiatry

## 2024-03-21 VITALS — Ht 72.0 in | Wt 188.6 lb

## 2024-03-21 DIAGNOSIS — Z7901 Long term (current) use of anticoagulants: Secondary | ICD-10-CM

## 2024-03-21 DIAGNOSIS — B351 Tinea unguium: Secondary | ICD-10-CM

## 2024-03-21 DIAGNOSIS — M79675 Pain in left toe(s): Secondary | ICD-10-CM

## 2024-03-21 DIAGNOSIS — M79674 Pain in right toe(s): Secondary | ICD-10-CM | POA: Diagnosis not present

## 2024-03-21 DIAGNOSIS — L97521 Non-pressure chronic ulcer of other part of left foot limited to breakdown of skin: Secondary | ICD-10-CM

## 2024-03-21 MED ORDER — MUPIROCIN 2 % EX OINT
1.0000 | TOPICAL_OINTMENT | Freq: Two times a day (BID) | CUTANEOUS | 2 refills | Status: AC
Start: 2024-03-21 — End: ?

## 2024-03-21 NOTE — Patient Instructions (Addendum)
 Wash foot with soap and water.  Applying small amount of antibiotic ointment, mupirocin , to the wound daily. Monitor for any signs/symptoms of infection. Call the office immediately if any occur or go directly to the emergency room. Call with any questions/concerns.  If it is not healed in 2 weeks, please let me know (or sooner if any worsening).

## 2024-03-22 ENCOUNTER — Telehealth: Payer: Self-pay | Admitting: Family Medicine

## 2024-03-22 NOTE — Telephone Encounter (Signed)
Patient's wife returned call. Requests to be called.

## 2024-03-27 ENCOUNTER — Other Ambulatory Visit: Payer: Self-pay | Admitting: Family Medicine

## 2024-03-27 NOTE — Progress Notes (Signed)
 Subjective: Chief Complaint  Patient presents with   Nail Problem    Pt is here for [facility],  recently scraped the left great toe, not sure how seen PCP for it last week was told that it is healing well.    83 year old male presents the office today with above concerns.  States the nails are thickened elongated he cannot trim them himself and causing discomfort.    He also developed a ulcer on the left big toe, treated by his PCP. He and his wife state that the wound is doing better. No drainage/pus. No fevers/chills.   Last A1c was 7.7 in 03/11/24  Katrinka Garnette KIDD, MD Last seen 03/11/24  On Eliquis    Objective: AAO x3, NAD DP/PT pulses palpable bilaterally, CRT less than 3 seconds Nails are hypertrophic, dystrophic, brittle, discolored, elongated 9. No surrounding redness or drainage. 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  Ulceration to the left hallux as pictured below with a granular wound base. No probing, undermining, tunneling. No surrounding erythema, ascending cellulitis. No fluctuation/crepitation/malodor. No pain with calf compression, swelling, warmth, erythema    Assessment: 83 year old male with symptomatic onychomycosis; left hallux ulcer  Plan: Symptomatic Onychomycosis  -Sharply debrided the nails x 9 without any complications or bleeding.  Left hallux ulcer -Appears to be healing, also noted by him/wife. Continue with daily dressing changes. Wash with soap/water, dry well, apply a small amount of antibiotic ointment and a bandage.  -If not healed in 2 weeks to let us  know, or sooner if any worsening. -Monitor for any clinical signs or symptoms of infection and directed to call the office immediately should any occur or go to the ER.  Return in about 2 months (around 05/21/2024) for nail trim .  Donnice JONELLE Fees DPM

## 2024-04-18 NOTE — Progress Notes (Signed)
 Walter [name] is a very pleasant 83 y.o. 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 Patient is currently on donepezil  10 mg daily***.  This patient is accompanied in the office by his wife*** who supplements the history.  Previous records as well as any outside records available were reviewed prior to todays visit. Patient was last seen on 09/29/2023***. Memory decline is noted***.  Patient is able to participate on ADLs and continues to drive without difficulties. Mood is***    Follow-up in 6 months Continue donepezil  10 mg daily, side effects discussed Add memantine  10 mg twice daily as directed in view of cognitive decline. Recommend psychiatry for treatment of PTSD, anxiety and depression  recommend using the hearing aids to improve comprehension Continue CPAP for OSA  Discussed the use of AI scribe software for clinical note transcription with the patient, who gave verbal consent to proceed.  History of Present Illness       Any changes in memory since last visit? I think it is better.  He continues to have difficulty with speech and finding words, significantly.  He recently did speech therapy.  Likes to do reading, play triominos on the computer. He likes to do crossword puzzles and watch TV. Repeats oneself?  Endorsed, better than before . Disoriented when walking into a room?  Patient denies    Misplacing objects?  Patient denies   Wandering behavior?   Denies. Any personality changes since last visit?  As before, he has high degree of frustration when he cannot remember. Any worsening depression?: denies.    Hallucinations or paranoia? Denies.   Seizures?   Denies.    Any sleep changes? Sleeps well.   Denies vivid dreams, REM behavior or sleepwalking   Sleep apnea?  Endorsed, uses the CPAP nightly follows pulmonology   Any hygiene concerns? Sometimes he needs reminder  Independent of bathing and dressing?  Endorsed  Does the patient needs help with medications?  Wife is in charge   Who is in charge of the finances? Wife is in charge     Any changes in appetite?  Denies, gained 13 lbs since his last hospitalizations    Patient have trouble swallowing?  Denies.   Does the patient cook?  No    Any headaches?    Denies.   Vision changes? Denies. Chronic pain?  Denies.   Ambulates with difficulty?   He has a history of right second toe amputation, which may cause some gait instability at times, he is prone to mechanical falls.   Recent falls or head injuries? Denies.      Unilateral weakness, numbness or tingling?  Denies.   Any tremors?  Chronic, bilateral, mild and intermittent, likely medication induced.   Any anosmia?    Denies.   Any incontinence of urine?  Denies.   Any bowel dysfunction?  constipation, it is better  Patient lives with his wife.  Does the patient drive?  No longer drives        History on Initial Assessment 11/25/2019: This is a 83 year old right-handed man with a history of hypertension, hyperlipidemia, diabetes, sleep apnea on  CPAP, colon cancer s/p resection, presenting for evaluation of memory loss/word-finding difficulties. He reported symptoms started 2 years ago, MMSE 25/30 in PCP office last 08/2019. He and his wife started noticing more significant changes in the past 1 and 1/2 years. He endorses word-finding difficulties, he tries to tell something and the important part slips by him. He states it is not constant, however his wife says it happens a lot. He is searching for words and thoughts, unable to remember names of things or restaurants. He is forgetting how to  do things sometimes, this is not often, but one time he forgot how to put his car in park (had to push a button). He has had this car for 2 years. He repeats stories. He cannot focus when watching TV and she is speaking to him, unable to multitask. He says huh or what did you say a lot, and she does not think it is his hearing. He lives with his wife. He denies getting lost driving, no missed ills or medications. He denies misplacing things. His wife has also noticed personality changes. He has a short temper worse than before and gets very nasty. When he cannot think of a word, it makes him nervous and uptight. He gets angry and upset when criticized or when he does not agree. No paranoia or hallucinations, no prior psychiatric history. He states his mood is good. His mother had dementia. No history of significant head injuries. No alcohol  use.   He had constipation which resolved with a stool softener. He denies any headaches, dizziness, diplopia, dysarthria/dysphagia, neck/back pain, focal numbness/tingling/weakness, bladder dysfunction, anosmia, or tremors. He sleeps like a baby with his CPAP machine. 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      Neuropsych evaluation 10/31/2023 Dr. Lenox, results suggested severe impairment surrounding cognitive flexibility, receptive language, semantic fluency, confrontation naming, clock drawing, and both encoding and retrieval aspects of verbal learning and memory. Additional weakness/impairment was exhibited across complex attention and phonemic fluency. Processing speed was variable. Relative to his neuropsychological evaluation in August 2022 (selected for greater test consistency), quite noteworthy decline has occurred across numerous cognitive domains. Decline is most substantial across expressive and receptive language, as well as both encoding (i.e., learning) and relayed retrieval aspects of memory. Decline is also noteworthy across  processing speed, attention/concentration, and executive functioning. Relative to his most recent evaluation in June 2024, significant decline can again be seen across expressive and receptive language, as well as attention/concentration and executive functioning. Other domains exhibited relative stability over the past year. The likelihood of an underlying neurodegenerative illness remains quite high given evidence for progressive decline since 2021. Consideration should be given to the language variant of Alzheimer's disease (sometimes known as the logopenic variant or a logopenic primary progressive aphasia presentation). Historically, word finding and trouble with expressive language has been Mr. Inman's most salient concern. Expressive language has represented the most significant area of impairment (even predating prominent memory impairment) and greatest area of decline over time, which would align with this presentation. Given that this illness shares underlying pathology with typically presenting Alzheimer's disease, prominent memory impairment would also align with this presentation. Also on his differential should remain Alzheimer's disease itself. Retention rates were very poor across memory testing and he has exhibited progressive decline over the past several years. Deficits in expressive language, clock drawing, and cognitive flexibility would follow the expected progression of this illness as it spreads. Curiously, he did show some benefit  from cueing across current memory testing, thus not suggestive of a prominent storage impairment, which is one of the hallmark testing patterns for this illness.         09/20/2022    5:00 PM 09/02/2021   11:00 AM 02/19/2016   10:30 AM  MMSE - Mini Mental State Exam  Not completed:   --  Orientation to time 3 5   Orientation to Place 5 5   Registration 3 3   Attention/ Calculation 5 5   Recall 1 0   Language- name 2 objects 2 2   Language- repeat 1 1    Language- follow 3 step command 3 3   Language- read & follow direction 1 1   Write a sentence 1 1   Copy design 1 1   Total score 26 27        No data to display            Objective:    Neurological Exam:    VITALS:  There were no vitals filed for this visit.  GEN:  The patient appears stated age and is in NAD. HEENT:  Normocephalic, atraumatic.   Neurological examination:  General: NAD, well-groomed, appears stated age. Orientation: The patient is alert. Oriented to person, not to place and date Cranial nerves: There is good facial symmetry. Very anxious appearing.  The speech is fluent and clear. No aphasia or dysarthria. Fund of knowledge is appropriate. Recent and remote memory are impaired. Attention and concentration are reduced. Able to name objects and repeat phrases.  Hearing is decreased to conversational tone. *** Sensation: Sensation is intact to light touch throughout Motor: Strength is at least antigravity x4. DTR's 2/4 in UE/LE     Movement examination:  Tone: There is normal tone in the UE/LE Abnormal movements:  no tremor.  No myoclonus.  No asterixis.   Coordination:  There is no decremation with RAM's. Normal finger to nose  Gait and Station: The patient has no*** difficulty arising out of a deep-seated chair without the use of the hands. The patient's stride length is short.  Gait is cautious and narrow.    Thank you for allowing us  the opportunity to participate in the care of this nice patient. Please do not hesitate to contact us  for any questions or concerns.   Total time spent on today's visit was *** minutes dedicated to this patient today, preparing to see patient, examining the patient, ordering tests and/or medications and counseling the patient, documenting clinical information in the EHR or other health record, independently interpreting results and communicating results to the patient/family, discussing treatment and goals, answering  patient's questions and coordinating care.  Cc:  Katrinka Garnette KIDD, MD  Camie Sevin 04/18/2024 5:52 AM

## 2024-04-19 ENCOUNTER — Ambulatory Visit: Admitting: Physician Assistant

## 2024-04-19 VITALS — BP 157/76 | HR 79 | Resp 20 | Wt 187.0 lb

## 2024-04-19 DIAGNOSIS — G309 Alzheimer's disease, unspecified: Secondary | ICD-10-CM

## 2024-04-19 DIAGNOSIS — F0280 Dementia in other diseases classified elsewhere without behavioral disturbance: Secondary | ICD-10-CM

## 2024-04-19 MED ORDER — MEMANTINE HCL 5 MG PO TABS: ORAL_TABLET | ORAL | 3 refills | Status: AC

## 2024-04-19 NOTE — Patient Instructions (Signed)
 Sherleen to see you.  Continue Donepezil  to 10mg  daily. 1 tablet daily  Start Memantine  5 mg: Take 1 tablet (10 mg at night) for 2 weeks, then increase to 1 tablet (5mg ) twice a day.  Side effects discussed    Follow up June 26 at 11:30  Follow-up in 6 months, call for any changes   RECOMMENDATIONS FOR ALL PATIENTS WITH MEMORY PROBLEMS: 1. Continue to exercise (Recommend 30 minutes of walking everyday, or 3 hours every week) 2. Increase social interactions - continue going to [place] and enjoy social gatherings with friends and family 3. Eat healthy, avoid fried foods and eat more fruits and vegetables 4. Maintain adequate blood pressure, blood sugar, and blood cholesterol level. Reducing the risk of stroke and cardiovascular disease also helps promoting better memory. 5. Avoid stressful situations. Live a simple life and avoid aggravations. Organize your time and prepare for the next day in anticipation. 6. Sleep well, avoid any interruptions of sleep and avoid any distractions in the bedroom that may interfere with adequate sleep quality 7. Avoid sugar, avoid sweets as there is a strong link between excessive sugar intake, diabetes, and cognitive impairment We discussed the Mediterranean diet, which has been shown to help patients reduce the risk of progressive memory disorders and reduces cardiovascular risk. This includes eating fish, eat fruits and green leafy vegetables, nuts like almonds and hazelnuts, walnuts, and also use olive oil. Avoid fast foods and fried foods as much as possible. Avoid sweets and sugar as sugar use has been linked to worsening of memory function.  There is always a concern of gradual progression of memory problems. If this is the case, then we may need to adjust level of care according to patient needs. Support, both to the patient and caregiver, should then be put into place.      Mediterranean Diet  Why follow it? Research shows Those who follow the Mediterranean  diet have a reduced risk of heart disease  The diet is associated with a reduced incidence of Parkinson's and Alzheimer's diseases People following the diet may have longer life expectancies and lower rates of chronic diseases  The Dietary Guidelines for Americans recommends the Mediterranean diet as an eating plan to promote health and prevent disease  What Is the Mediterranean Diet?  Healthy eating plan based on typical foods and recipes of Mediterranean-style cooking The diet is primarily a plant based diet; these foods should make up a majority of meals   Starches - Plant based foods should make up a majority of meals - They are an important sources of vitamins, minerals, energy, antioxidants, and fiber - Choose whole grains, foods high in fiber and minimally processed items  - Typical grain sources include wheat, oats, barley, corn, brown rice, bulgar, farro, millet, polenta, couscous  - Various types of beans include chickpeas, lentils, fava beans, black beans, white beans   Fruits  Veggies - Large quantities of antioxidant rich fruits & veggies; 6 or more servings  - Vegetables can be eaten raw or lightly drizzled with oil and cooked  - Vegetables common to the traditional Mediterranean Diet include: artichokes, arugula, beets, broccoli, brussel sprouts, cabbage, carrots, celery, collard greens, cucumbers, eggplant, kale, leeks, lemons, lettuce, mushrooms, okra, onions, peas, peppers, potatoes, pumpkin, radishes, rutabaga, shallots, spinach, sweet potatoes, turnips, zucchini - Fruits common to the Mediterranean Diet include: apples, apricots, avocados, cherries, clementines, dates, figs, grapefruits, grapes, melons, nectarines, oranges, peaches, pears, pomegranates, strawberries, tangerines  Fats - Replace butter and margarine  with healthy oils, such as olive oil, canola oil, and tahini  - Limit nuts to no more than a handful a day  - Nuts include walnuts, almonds, pecans, pistachios, pine  nuts  - Limit or avoid candied, honey roasted or heavily salted nuts - Olives are central to the Mediterranean diet - can be eaten whole or used in a variety of dishes   Meats Protein - Limiting red meat: no more than a few times a month - When eating red meat: choose lean cuts and keep the portion to the size of deck of cards - Eggs: approx. 0 to 4 times a week  - Fish and lean poultry: at least 2 a week  - Healthy protein sources include, chicken, turkey, lean beef, lamb - Increase intake of seafood such as tuna, salmon, trout, mackerel, shrimp, scallops - Avoid or limit high fat processed meats such as sausage and bacon  Dairy - Include moderate amounts of low fat dairy products  - Focus on healthy dairy such as fat free yogurt, skim milk, low or reduced fat cheese - Limit dairy products higher in fat such as whole or 2% milk, cheese, ice cream  Alcohol  - Moderate amounts of red wine is ok  - No more than 5 oz daily for women (all ages) and men older than age 40  - No more than 10 oz of wine daily for men younger than 61  Other - Limit sweets and other desserts  - Use herbs and spices instead of salt to flavor foods  - Herbs and spices common to the traditional Mediterranean Diet include: basil, bay leaves, chives, cloves, cumin, fennel, garlic, lavender, marjoram, mint, oregano, parsley, pepper, rosemary, sage, savory, sumac, tarragon, thyme   Its not just a diet, its a lifestyle:  The Mediterranean diet includes lifestyle factors typical of those in the region  Foods, drinks and meals are best eaten with others and savored Daily physical activity is important for overall good health This could be strenuous exercise like running and aerobics This could also be more leisurely activities such as walking, housework, yard-work, or taking the stairs Moderation is the key; a balanced and healthy diet accommodates most foods and drinks Consider portion sizes and frequency of consumption of  certain foods   Meal Ideas & Options:  Breakfast:  Whole wheat toast or whole wheat English muffins with peanut butter & hard boiled egg Steel cut oats topped with apples & cinnamon and skim milk  Fresh fruit: banana, strawberries, melon, berries, peaches  Smoothies: strawberries, bananas, greek yogurt, peanut butter Low fat greek yogurt with blueberries and granola  Egg white omelet with spinach and mushrooms Breakfast couscous: whole wheat couscous, apricots, skim milk, cranberries  Sandwiches:  Hummus and grilled vegetables (peppers, zucchini, squash) on whole wheat bread   Grilled chicken on whole wheat pita with lettuce, tomatoes, cucumbers or tzatziki  Tuna salad on whole wheat bread: tuna salad made with greek yogurt, olives, red peppers, capers, green onions Garlic rosemary lamb pita: lamb sauted with garlic, rosemary, salt & pepper; add lettuce, cucumber, greek yogurt to pita - flavor with lemon juice and black pepper  Seafood:  Mediterranean grilled salmon, seasoned with garlic, basil, parsley, lemon juice and black pepper Shrimp, lemon, and spinach whole-grain pasta salad made with low fat greek yogurt  Seared scallops with lemon orzo  Seared tuna steaks seasoned salt, pepper, coriander topped with tomato mixture of olives, tomatoes, olive oil, minced garlic, parsley, green onions and  cappers  Meats:  Herbed greek chicken salad with kalamata olives, cucumber, feta  Red bell peppers stuffed with spinach, bulgur, lean ground beef (or lentils) & topped with feta   Kebabs: skewers of chicken, tomatoes, onions, zucchini, squash  Turkey burgers: made with red onions, mint, dill, lemon juice, feta cheese topped with roasted red peppers Vegetarian Cucumber salad: cucumbers, artichoke hearts, celery, red onion, feta cheese, tossed in olive oil & lemon juice  Hummus and whole grain pita points with a greek salad (lettuce, tomato, feta, olives, cucumbers, red onion) Lentil soup with  celery, carrots made with vegetable broth, garlic, salt and pepper  Tabouli salad: parsley, bulgur, mint, scallions, cucumbers, tomato, radishes, lemon juice, olive oil, salt and pepper.

## 2024-05-23 ENCOUNTER — Ambulatory Visit: Admitting: Podiatry

## 2024-05-23 DIAGNOSIS — B351 Tinea unguium: Secondary | ICD-10-CM | POA: Diagnosis not present

## 2024-05-23 DIAGNOSIS — M79675 Pain in left toe(s): Secondary | ICD-10-CM

## 2024-05-23 DIAGNOSIS — Z7901 Long term (current) use of anticoagulants: Secondary | ICD-10-CM

## 2024-05-23 DIAGNOSIS — L97521 Non-pressure chronic ulcer of other part of left foot limited to breakdown of skin: Secondary | ICD-10-CM | POA: Diagnosis not present

## 2024-05-23 DIAGNOSIS — M79674 Pain in right toe(s): Secondary | ICD-10-CM | POA: Diagnosis not present

## 2024-05-23 NOTE — Progress Notes (Unsigned)
 Subjective: Chief Complaint  Patient presents with   [facility]    NIDDM patient with an A1c of 7.7 presents today for [facility] and nail trim.     84 year old male presents the office today with above concerns.  States the nails are thickened elongated he cannot trim them himself and causing discomfort.    He has presents here for Evaluation of ulcer on the left big toe.  He states this is been doing well there is no drainage or pus clear.  The area is healed.  No open lesions identified otherwise.  Last A1c was 7.7 in 03/11/24  Katrinka Garnette KIDD, MD Last seen 03/11/24  On Eliquis    Objective: AAO x3, NAD DP/PT pulses palpable bilaterally, CRT less than 3 seconds Nails are hypertrophic, dystrophic, brittle, discolored, elongated 9. No surrounding redness or drainage. 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  Hyperkeratotic lesion on the area the previous ulcer left hallux without any underlying ulceration, drainage or signs of infection.  There is no open lesion identified otherwise today. No pain with calf compression, swelling, warmth, erythema    Assessment: 85 year old male with symptomatic onychomycosis; left hallux ulcer  Plan: Symptomatic Onychomycosis  -Sharply debrided the nails x 9 without any complications or bleeding.  Left hallux ulcer - This is resolved.  The hyperkeratotic lesion overlying the area of the wound which I debrided today any complications or bleeding.  Discussed moisturizer, offloading daily. -Monitor for any clinical signs or symptoms of infection and directed to call the office immediately should any occur or go to the ER.  Return in about 2 months (around 07/23/2024) for nail/callus trim .   Donnice JONELLE Fees DPM

## 2024-05-29 ENCOUNTER — Other Ambulatory Visit: Payer: Self-pay | Admitting: Family Medicine

## 2024-05-30 ENCOUNTER — Other Ambulatory Visit (HOSPITAL_COMMUNITY): Payer: Self-pay | Admitting: Urology

## 2024-05-30 DIAGNOSIS — C61 Malignant neoplasm of prostate: Secondary | ICD-10-CM

## 2024-06-13 ENCOUNTER — Encounter (HOSPITAL_COMMUNITY)

## 2024-06-18 ENCOUNTER — Ambulatory Visit: Admitting: Family Medicine

## 2024-07-25 ENCOUNTER — Ambulatory Visit: Admitting: Podiatry

## 2024-10-25 ENCOUNTER — Ambulatory Visit: Payer: Self-pay | Admitting: Physician Assistant
